# Patient Record
Sex: Male | Born: 1960
Health system: Southern US, Community
[De-identification: ages and names within clinical notes are randomized; demographics above are authoritative.]

## PROBLEM LIST (undated history)

## (undated) DIAGNOSIS — K2270 Barrett's esophagus without dysplasia: Secondary | ICD-10-CM

## (undated) DIAGNOSIS — I8390 Asymptomatic varicose veins of unspecified lower extremity: Secondary | ICD-10-CM

## (undated) DIAGNOSIS — I482 Chronic atrial fibrillation, unspecified: Secondary | ICD-10-CM

## (undated) DIAGNOSIS — F329 Major depressive disorder, single episode, unspecified: Secondary | ICD-10-CM

## (undated) DIAGNOSIS — M1990 Unspecified osteoarthritis, unspecified site: Secondary | ICD-10-CM

## (undated) DIAGNOSIS — F209 Schizophrenia, unspecified: Secondary | ICD-10-CM

## (undated) DIAGNOSIS — M545 Low back pain, unspecified: Secondary | ICD-10-CM

## (undated) DIAGNOSIS — E079 Disorder of thyroid, unspecified: Secondary | ICD-10-CM

## (undated) DIAGNOSIS — R569 Unspecified convulsions: Secondary | ICD-10-CM

## (undated) DIAGNOSIS — E669 Obesity, unspecified: Secondary | ICD-10-CM

## (undated) DIAGNOSIS — I4891 Unspecified atrial fibrillation: Secondary | ICD-10-CM

## (undated) DIAGNOSIS — F419 Anxiety disorder, unspecified: Secondary | ICD-10-CM

## (undated) DIAGNOSIS — F32A Depression, unspecified: Secondary | ICD-10-CM

## (undated) DIAGNOSIS — I499 Cardiac arrhythmia, unspecified: Secondary | ICD-10-CM

## (undated) DIAGNOSIS — S52502A Unspecified fracture of the lower end of left radius, initial encounter for closed fracture: Secondary | ICD-10-CM

## (undated) DIAGNOSIS — E039 Hypothyroidism, unspecified: Secondary | ICD-10-CM

## (undated) HISTORY — PX: ACHILLES TENDON REPAIR: SUR1153

## (undated) HISTORY — PX: CATARACT EXTRACTION: SUR2

## (undated) HISTORY — DX: Asymptomatic varicose veins of unspecified lower extremity: I83.90

## (undated) HISTORY — DX: Low back pain, unspecified: M54.50

## (undated) HISTORY — DX: Obesity, unspecified: E66.9

## (undated) HISTORY — DX: Anxiety disorder, unspecified: F41.9

## (undated) HISTORY — DX: Barrett's esophagus without dysplasia: K22.70

## (undated) HISTORY — PX: TOOTH EXTRACTION: SUR596

---

## 1999-12-12 ENCOUNTER — Emergency Department (HOSPITAL_COMMUNITY): Admission: EM | Admit: 1999-12-12 | Discharge: 1999-12-12 | Payer: Self-pay | Admitting: Emergency Medicine

## 2001-12-12 ENCOUNTER — Emergency Department (HOSPITAL_COMMUNITY): Admission: EM | Admit: 2001-12-12 | Discharge: 2001-12-12 | Payer: Self-pay

## 2001-12-21 ENCOUNTER — Emergency Department (HOSPITAL_COMMUNITY): Admission: EM | Admit: 2001-12-21 | Discharge: 2001-12-21 | Payer: Self-pay | Admitting: Emergency Medicine

## 2001-12-21 ENCOUNTER — Encounter: Payer: Self-pay | Admitting: Emergency Medicine

## 2002-05-02 ENCOUNTER — Emergency Department (HOSPITAL_COMMUNITY): Admission: EM | Admit: 2002-05-02 | Discharge: 2002-05-02 | Payer: Self-pay | Admitting: *Deleted

## 2002-07-06 ENCOUNTER — Emergency Department (HOSPITAL_COMMUNITY): Admission: EM | Admit: 2002-07-06 | Discharge: 2002-07-06 | Payer: Self-pay | Admitting: *Deleted

## 2002-08-16 ENCOUNTER — Encounter: Payer: Self-pay | Admitting: Emergency Medicine

## 2002-08-16 ENCOUNTER — Emergency Department (HOSPITAL_COMMUNITY): Admission: EM | Admit: 2002-08-16 | Discharge: 2002-08-16 | Payer: Self-pay | Admitting: Emergency Medicine

## 2002-08-31 ENCOUNTER — Encounter: Payer: Self-pay | Admitting: Orthopedic Surgery

## 2002-09-03 ENCOUNTER — Inpatient Hospital Stay (HOSPITAL_COMMUNITY): Admission: RE | Admit: 2002-09-03 | Discharge: 2002-09-08 | Payer: Self-pay | Admitting: Orthopedic Surgery

## 2002-09-08 ENCOUNTER — Inpatient Hospital Stay (HOSPITAL_COMMUNITY)
Admission: RE | Admit: 2002-09-08 | Discharge: 2002-09-15 | Payer: Self-pay | Admitting: Physical Medicine & Rehabilitation

## 2002-11-21 ENCOUNTER — Encounter: Admission: RE | Admit: 2002-11-21 | Discharge: 2003-01-05 | Payer: Self-pay | Admitting: Orthopedic Surgery

## 2004-04-29 ENCOUNTER — Ambulatory Visit: Payer: Self-pay | Admitting: Internal Medicine

## 2004-06-11 ENCOUNTER — Ambulatory Visit: Payer: Self-pay | Admitting: Internal Medicine

## 2004-09-24 ENCOUNTER — Ambulatory Visit: Payer: Self-pay | Admitting: Internal Medicine

## 2004-10-01 ENCOUNTER — Ambulatory Visit: Payer: Self-pay | Admitting: Internal Medicine

## 2004-10-08 ENCOUNTER — Ambulatory Visit: Payer: Self-pay | Admitting: Internal Medicine

## 2004-10-22 ENCOUNTER — Ambulatory Visit: Payer: Self-pay | Admitting: Internal Medicine

## 2004-11-08 ENCOUNTER — Emergency Department (HOSPITAL_COMMUNITY): Admission: EM | Admit: 2004-11-08 | Discharge: 2004-11-08 | Payer: Self-pay | Admitting: Emergency Medicine

## 2004-11-15 ENCOUNTER — Emergency Department (HOSPITAL_COMMUNITY): Admission: EM | Admit: 2004-11-15 | Discharge: 2004-11-15 | Payer: Self-pay | Admitting: Emergency Medicine

## 2005-04-16 ENCOUNTER — Ambulatory Visit: Payer: Self-pay | Admitting: Internal Medicine

## 2005-05-07 ENCOUNTER — Ambulatory Visit: Payer: Self-pay | Admitting: Internal Medicine

## 2005-06-17 ENCOUNTER — Inpatient Hospital Stay (HOSPITAL_COMMUNITY): Admission: EM | Admit: 2005-06-17 | Discharge: 2005-06-18 | Payer: Self-pay | Admitting: Emergency Medicine

## 2005-06-17 ENCOUNTER — Encounter (INDEPENDENT_AMBULATORY_CARE_PROVIDER_SITE_OTHER): Payer: Self-pay | Admitting: Interventional Cardiology

## 2005-08-10 ENCOUNTER — Emergency Department (HOSPITAL_COMMUNITY): Admission: EM | Admit: 2005-08-10 | Discharge: 2005-08-10 | Payer: Self-pay | Admitting: Emergency Medicine

## 2005-11-27 ENCOUNTER — Ambulatory Visit: Payer: Self-pay | Admitting: Family Medicine

## 2007-01-31 ENCOUNTER — Inpatient Hospital Stay (HOSPITAL_COMMUNITY): Admission: EM | Admit: 2007-01-31 | Discharge: 2007-02-08 | Payer: Self-pay | Admitting: Emergency Medicine

## 2007-01-31 ENCOUNTER — Ambulatory Visit: Payer: Self-pay | Admitting: Cardiovascular Disease

## 2007-01-31 ENCOUNTER — Ambulatory Visit: Payer: Self-pay | Admitting: Cardiology

## 2007-02-10 DIAGNOSIS — L219 Seborrheic dermatitis, unspecified: Secondary | ICD-10-CM | POA: Insufficient documentation

## 2007-02-10 DIAGNOSIS — E119 Type 2 diabetes mellitus without complications: Secondary | ICD-10-CM | POA: Insufficient documentation

## 2007-02-10 DIAGNOSIS — R7989 Other specified abnormal findings of blood chemistry: Secondary | ICD-10-CM | POA: Insufficient documentation

## 2007-02-10 DIAGNOSIS — I10 Essential (primary) hypertension: Secondary | ICD-10-CM | POA: Insufficient documentation

## 2007-02-10 DIAGNOSIS — E663 Overweight: Secondary | ICD-10-CM | POA: Insufficient documentation

## 2007-02-10 DIAGNOSIS — F329 Major depressive disorder, single episode, unspecified: Secondary | ICD-10-CM | POA: Insufficient documentation

## 2008-02-26 ENCOUNTER — Emergency Department (HOSPITAL_COMMUNITY): Admission: EM | Admit: 2008-02-26 | Discharge: 2008-02-26 | Payer: Self-pay | Admitting: Emergency Medicine

## 2010-01-24 ENCOUNTER — Emergency Department (HOSPITAL_COMMUNITY): Admission: EM | Admit: 2010-01-24 | Discharge: 2010-01-24 | Payer: Self-pay | Admitting: Emergency Medicine

## 2010-07-26 ENCOUNTER — Encounter: Payer: Self-pay | Admitting: Internal Medicine

## 2010-07-27 ENCOUNTER — Encounter: Payer: Self-pay | Admitting: Interventional Cardiology

## 2010-11-18 NOTE — H&P (Signed)
NAME:  [name], [name]               ACCOUNT NO.:  [id number]   MEDICAL RECORD NO.:  [id number]          PATIENT TYPE:  INP   LOCATION:  1413                         FACILITY:  [facility]   PHYSICIAN:  Hind I Elsaid, MD      DATE OF BIRTH:  1960-12-09   DATE OF ADMISSION:  01/31/2007  DATE OF DISCHARGE:                              HISTORY & PHYSICAL   PRIMARY CARE PHYSICIAN:  Unassigned.   CHIEF COMPLAINT:  Altered mental status. Psychiatrist, Dr. Geoffry Paradise.   HISTORY OF PRESENT ILLNESS:  A 50 year old Caucasian male with a history  of obsessive compulsive disorder/bipolar disorder, history of  hypothyroidism, presented today by EMS where the patient was found on  the floor with confusion.  History was taken from both the patient's  aunt, his parents.  According to the father, the patient was complaining  of neck pain for 2 days and right shoulder pain for 1 week.  They live  far from him, and they give him a call on Friday where the patient  complained of pain.  He received his medications.  At that time, he had  poor appetite.  He denies any fever.  On Saturday, the patient also has  the same complaint of neck and shoulder pain, denies any fever.  Today,  the patient has an appointment with his father to see the dentist for  possible tooth extraction.  Father called the son at his apartment with  no answer, and he had to call the police where GBG went to the house and  found the patient lying on the floor.  The patient stated he did not  know how long he was there for.  The patient complained of neck and  right shoulder pain.  At that time, the patient was unable to tell the  EMS the correct year or month, and the patient was unable to give  correct information.  According to the patient, he felt weak today, was  complaining of headache and neck and shoulder pain, stated with  generalized body weakness.  He found himself on the floor.  He cannot  tell any further history.  The  patient denies any headache, continued to  complaint about frontal headache.  Denies any neck stiffness, denies any  seizure.  The patient seemed confused during the conversation.  The  patient denies any abdominal pain, denies any nausea or vomiting and  denies any burning micturition.   PAST MEDICAL HISTORY:  1. Bipolar disorder.  2. Obsessive compulsive disorder.  3. Hypertension.  4. Hypothyroidism.  5. History of seizure disorder, last seizure was more than 15 years      ago.  6. History of gout.  7. History of depression.   ALLERGIES:  No known drug allergies.   OPERATIONS:  History of Achilles tendinitis, ruptured Achilles tendon  and status post correction.   FAMILY HISTORY:  Positive for history of heart failure, hypertension,  diabetes mellitus.   SOCIAL HISTORY:  He lives by himself.  He does not smoke cigarettes or  drink alcohol.  He is not working.  There  is no history of illicit drug  use.   REVIEW OF SYSTEMS:  The patient complained of headache, mainly frontal  headache.  No blurring of vision, no numbness or weakness of his  extremities.  Complained of neck pain and right shoulder pain.  The  patient denies any fever, any chills, or weight loss.  Denies any  genitourinary symptom, denies any abdominal pain, nausea, or vomiting.  Denies any chest pain   VITAL SIGNS:  Blood pressure 130/86, pulse rate 85, respiratory rate 18,  saturating 92% on room air.  Was obese, middle-age white male no discomfort.  He was alert, seemed  confused to time and place.  He knew his mother, and he knows the date  when he was born.  HEENT:  Normocephalic, atraumatic.  Pupils equal, reactive to light and  accommodation.  Mucosa seems dry.  Right lower molar tooth seemed mildly  inflamed.  No evidence of abscess or lymphadenopathy.  NECK:  Double chin.  No evidence of thyromegaly and no lymphadenopathy.  LUNGS:  Normal with equal breathing, equal air entry.  ABDOMEN:   Distended, obese, no organomegaly.  Bowel sounds positive,  nontender, no rebound, guarding, or rigidity.  EXTREMITIES:  Without edema or deformities and no edema.  NEUROLOGIC:  The patient alert, oriented to person, and he can recognize  where he is right now.  He can recognize the date and the year.  There  is no evidence of neck stiffness.  Brudzinski sign negative.  He can  move all his extremities except there is some right shoulder limitation  of movement.  He can abduct his right shoulder up to 60 degrees.  No  warmth and no evidence of swelling compared between the 2 shoulders.  SKIN:  There is no evidence of rashes, no cyanosis.   The patient has obvious streamers of both arms which according to the  father was old in nature.   BLOOD WORKUP:  TSH 0.507, valproic acid 63.6, CMB 4138, potassium 3.7,  chloride 101, potassium 25, BUN 15, creatinine 1.01.  CBC 18.1 with left  shift, hemoglobin 14.5, hematocrit 41.3, platelets 275.  LFTs within  normal.  Urinalysis was negative.  Drug screen was negative.  Chest x-  ray:  Chronic lung changes, no acute process.  CT head negative for no  acute intracranial morphology, and x-ray of the right shoulder  irregularity of the inferior glenoid without acute fracture.   ASSESSMENT:  1. Altered mental status.  2. Elevated white blood cells.  No evidence of fever.  3. Bipolar disorder.  4. Obsessive compulsive disorder on many anti-psychotic medications.  5. Hypertension.  6. Hypothyroidism.   PLAN:  1. Assess altered mental status.  We mentioned the patient confused to      time and place, also with __________ infections, meningitis,      encephalitis, also with dehydration, possibility of rhabdomyolysis,      unlikely seizure, unlikely postictal.  We will admit the patient to      telemetry.  We will start the patient on vancomycin and Rocephin.      We will get MRI of the brain.  We will consider LB to rule out      meningitis versus  encephalitis.  If no response to the above      treatment, we will continue with the IV fluids, aggressive      hydration.  We will get Tylenol level and aspirin level.  CPK  level.  We will monitor the patient inpatient.  If no response, we      will consider LB to rule out meningitis.  We will get VDRL level,      folate level, vitamin B12 level, ammonia level.  2. Hypothyroidism.  We will continue his Synthroid.  3. Psychosis.  History of bipolar disorder, history of obsessive      compulsive disorder.  We will continue his medications.   MEDICATIONS:  1. Synthroid 137 mcg p.o. daily.  2. Nardil 15 mg 6 tabs p.o. daily.  3. __________ 8 mg p.o. t.i.d.  4. Benzatropine 2 mg p.o. daily.  5. Depakote AR 500 mg p.o. b.i.d.  6. Atenolol 50 mg p.o. daily.   Would consider psych consult inpatient if it is deemed necessary.  Hypertension was continued with Atenolol.   Cause of altered mental status is unclear.  We observe inpatient for  further evaluation of his cause of altered mental status.  DVT and GI  prophylaxis.      Hind Bosie Helper, MD  Electronically Signed     HIE/MEDQ  D:  01/31/2007  T:  02/01/2007  Job:  [id number]

## 2010-11-18 NOTE — Consult Note (Signed)
[name], Trujillo               ACCOUNT NO.:  [id number]   MEDICAL RECORD NO.:  [id number]          PATIENT TYPE:  INP   LOCATION:  1413                         [facility]ACILITY:  [facility]   PHYSICIAN:  Antonietta Breach, M.D.  DATE OF BIRTH:  Nov 04, 1960   DATE OF CONSULTATION:  02/01/2007  DATE OF DISCHARGE:                                 CONSULTATION   REQUESTING PHYSICIAN:  [facility].   REASON FOR CONSULTATION:  Delirium, presenting with multiple  psychotropic medications.   [name] is a 50 year old male admitted to [facility] on  January 31, 2007 due to altered mental status.   The patient does have a history of bipolar disorder and obsessive  compulsive disorder. He also has a history of hypothyroidism. On the day  of admission, the patient's father was trying to get a hold of him, and  he could not get an answer. He called EMS. When the police arrived, the  patient was disoriented, confused and found on the floor.   The patient's confusion has improved at the time of the undersigned's  visit. He does have intact orientation. However, he still does have  difficulty providing details of this history. He has no thoughts of  harming himself or others. He is socially appropriate. He has no  hallucinations or delusion.   PAST PSYCHIATRIC HISTORY:  The patient has a history of obsessive  compulsive disorder and bipolar disorder. He has been tried on multiple  psychotropics including Prozac, Zoloft, Paxil. He eventually was placed  on Nardil for antidepression and antianxiety. He has been on Depakote as  his mood stabilizer. He has also been maintained on Trilafon 8 mg t.i.d.  as well as Cogentin 2 mg daily for anti-EPS.   FAMILY PSYCHIATRIC HISTORY:  None known.   SOCIAL HISTORY:  Single. Lives alone. Religion - [employer]. Does not use  alcohol or illegal drugs. Occupation - medically retired.   PAST MEDICAL HISTORY:  1. Hypertension.  2. Hypothyroidism.  3. History  of seizure disorder.  4. History of gout.   MEDICATIONS:  MAR is reviewed. The patient has no known drug allergies.  He is now continued on:  1. Cogentin 2 mg daily.  2. Depakote 500 mg b.i.d.  3. Levothyroxine 137 mcg daily.  4. Trilafon 8 mg t.i.d.  5. Phenelzine 45 mg b.i.d.   REVIEW OF SYSTEMS:  Noncontributory.   EXAMINATION:  VITAL SIGNS:  Temperature 98.4, pulse 66, respirations 18,  blood pressure 105/70, O2 saturation on room air 96%.  MENTAL STATUS EXAMINATION:  [name] is alert. He is socially  appropriate and cooperative. His affect is mildly flat. His mood is  slightly anxious. He is oriented to all spheres. His memory is grossly  intact except for difficulty with some of his history. His speech  involves normal rate and prosody without dysarthria. Thought process -  logical, coherent, goal directed. No looseness of association. Thought  content - he has no hallucinations or delusions. He has no thoughts of  harming himself or others. His insight is good. His judgment is intact  for the  need of treatment.   ASSESSMENT:  AXIS I:  293.00 - delirium, not otherwise specified. This  may have been due to a combination of his MAOI and a drug that could  have caused serotonin syndrome. His vital signs seemed to have been too  stable for this to have been a tyramine problem, and there is no  evidence of neuroleptic malignant syndrome.  296.80 - bipolar disorder, not otherwise specified.  293.84 - anxiety disorder, not otherwise specified, with a likely  history of obsessive compulsive disorder.  AXIS II:  Deferred.  AXIS III:  See general medical problems.  AXIS IV:  General medical.  AXIS V:  50.   Mr. Avans capacity appears to be returning. It is anticipated that  he will be able to return to outpatient living.   The medications were discussed with Mr. Sipp including the risk of  lethal side effects when Nardil is combined with contraindicated  substances.  He understands and does want to proceed with his previous  outpatient regimen including the Nardil.   RECOMMENDATIONS:  Would proceed as your are with the patient's  outpatient psychotropic regimen.   Regarding the patient's psychiatric followup, he still has some  difficulty with history, and it is unclear if he has a regular  outpatient psychiatrist. Would ask the social worker to set this patient  up with an outpatient psychiatrist of his choice within the first week  of discharge. Other options include the clinics attached to [facility], [facility], [facility] or the patient's county mental  health center.      Antonietta Breach, M.D.  Electronically Signed     JW/MEDQ  D:  02/02/2007  T:  02/02/2007  Job:  [id number]

## 2010-11-18 NOTE — Discharge Summary (Signed)
[name], [name]               ACCOUNT NO.:  [id number]   MEDICAL RECORD NO.:  [id number]          PATIENT TYPE:  INP   LOCATION:  1413                         FACILITY:  [facility]   PHYSICIAN:  Wilson Singer, M.D.DATE OF BIRTH:  Dec 26, 1960   DATE OF ADMISSION:  01/31/2007  DATE OF DISCHARGE:  02/08/2007                               DISCHARGE SUMMARY   FINAL DISCHARGE DIAGNOSES:  1. Altered mental state secondary to withdrawal of antipsychotic      medications.  2. Paroxysmal atrial fibrillation.  3. Bipolar disorder.  4. Obsessive-compulsive disorder.  5. Hypertension.  6. Hypothyroidism.  7. Gout.  8. Depression.   MEDICATIONS:  1. Levothyroxine 137 mcg daily.  2. Depakote ER 500 mg one tablet b.i.d.  3. Perphenazine 8 mg three tablets q.h.s.  4. Atenolol 50 mg daily.  5. Benztropine 2 mg, half-tablet b.i.d.  6. Nardil 15 mg three tablets b.i.d.  7. Protonix 40 mg daily.  8. Colchicine 0.6 mg b.i.d.  9. Diltiazem ER 120 mg daily.  10.Prednisone tapering dose, to maintain on prednisone 5 mg a day.   CONDITION ON DISCHARGE:  Stable.   HISTORY:  This 51 year old man had stopped taking some of his  medications such as Nardil and Cogentin, and he was found in his house  lying on the floor in a confused state.  Please see initial history and  examination done by Dr. Eda Paschal.   HOSPITAL PROGRESS:  The patient was admitted and underwent workup for  acute confusional state and altered mental status.  He had MRI of his  brain, which showed no major changes and no acute abnormality.  He also  had an x-ray of his right shoulder because of falling onto the right  shoulder, but there was no fracture there.  While in the hospital he  developed atrial fibrillation and he was seen by cardiology.  They did  an echocardiogram, which showed that the images were severely limited  but there did not appear to be any severe abnormalities.  The  cardiologist felt he was not a good  candidate for anticoagulation and  decided not to do that, and he was controlled with his rate with  addition of Cardizem.  He did well with this and actually converted back  to sinus rhythm of his own accord and remained in sinus rhythm.  During  his stay he did complain of left knee pain and swelling.  His uric acid  level was raised and his sedimentation rate was also raised above 100.  In light of this, he was started with colchicine and steroids and he  seemed to do well with this.  He was also seen by psychiatry while in  the hospital and they decided to restart his medication, which actually  significantly helped his confusional state and brought him back to a  normal state for him.  He still continued to complain of bilateral  wrist pain and other joint pain, so rheumatology was consulted.  Dr.  Kellie Simmering from rheumatology saw the patient and he felt that he basically  had gout and increased his  steroid, and he will undergo a steroid taper.  He will see the patient in his office in about 5 weeks' time.  He has  been seen by occupational and physical therapy and they feel that he is  safe to go home but he will clearly need home health physical and  occupational therapy.   On physical examination today, he is doing well, and he is alert,  oriented, and is back to his usual self.  On physical examination, temperature 97.7, blood pressure 124/88, pulse  55, saturation 93% on room air.  CARDIOVASCULAR:  Heart sounds are present and normal and appear to be in  sinus rhythm.  Lung fields were clear.   His rheumatoid factor is less than 20.   FURTHER DISPOSITION:  I am sending him home on steroid, a prednisone  tapering dose as recommended by Dr. Kellie Simmering.  He must see Dr. Kellie Simmering in  about 5 weeks, and I will suggest he also see his primary care physician  in the next 1-2 weeks for follow-up.      Wilson Singer, M.D.  Electronically Signed     NCG/MEDQ  D:  02/08/2007  T:   02/08/2007  Job:  [id number]   cc:   Dr. Geoffry Paradise, Psychiatrist   Antonietta Breach, M.D.   Veverly Fells. Excell Seltzer, MD  [address]  [place], [place] [address]

## 2010-11-18 NOTE — Discharge Summary (Signed)
[name], [name]               ACCOUNT NO.:  [id number]   MEDICAL RECORD NO.:  [id number]          PATIENT TYPE:  INP   LOCATION:  1413                         FACILITY:  [facility]   PHYSICIAN:  Wilson Singer, M.D.DATE OF BIRTH:  [birth year]   DATE OF ADMISSION:  01/31/2007  DATE OF DISCHARGE:                               DISCHARGE SUMMARY   Audio too short to transcribe (less than 5 seconds)      Wilson Singer, M.D.     NCG/MEDQ  D:  02/08/2007  T:  02/08/2007  Job:  [id number]

## 2010-11-18 NOTE — Consult Note (Signed)
NAMEBRADAN, [name]               ACCOUNT NO.:  [id number]   MEDICAL RECORD NO.:  [id number]          PATIENT TYPE:  INP   LOCATION:  1413                         [facility]ACILITY:  [facility]   PHYSICIAN:  Veverly Fells. Excell Seltzer, MD  DATE OF BIRTH:  May 25, 1961   DATE OF CONSULTATION:  02/01/2007  DATE OF DISCHARGE:                                 CONSULTATION   REQUESTING PHYSICIAN:  [facility].   PRIMARY CARE PHYSICIAN:  Dr. Orinda Kenner.   CARDIOLOGIST:  Will be new, Dr. Tonny Bollman.   REASON FOR CONSULTATION:  New onset atrial fibrillation.   HISTORY OF PRESENT ILLNESS:  A 50 year old, obese, Caucasian male  brought by EMS after falling at home. The patient is a little confused  in his thought processes and family members have arrived during our  interview. Apparently the patient has not been taking his medications at  home as directed and had not been taking some of his thyroid medications  either according to his father. The patient states that he got up out of  his chair and fell. He did not actually pass out. He had called his  father several times prior to this admission a couple days before and  was very confused and they did not hear from him on the day of admission  and therefore they called the police and had them check on him. The  father says the police found him crawling on the floor unable to stand  up. They did call EMS and he was brought to the ER at [facility]. He  was found to be confused. He was complaining of right shoulder pain. The  ER physician evaluated him for meningitis with physical exam and blood  work. He did not have a spinal tap. The patient is still mildly confused  this a.m. The physician who talked with him today noticed that his heart  rate was irregular, an EKG was completed and it showed atrial flutter  with a ventricular rate of 127 beats per minute. He was subsequently  placed on a Cardizem drip at 10 mg an hour and ordered a heparin drip.  On  our evaluation, the patient had converted to normal sinus rhythm. The  patient did not complain of any shortness of breath, chest pain,  dizziness or diaphoresis although he is unable to remember very much  about his history. The patient apparently had had a history according to  his father of an irregular heart rate, atrial fibrillation in the past  secondary to thyroid abnormalities but is not followed by a  cardiologist. The patient also apparently as had a history of  hypertension and his blood pressure medication has not always been taken  as directed.   REVIEW OF SYSTEMS:  Negative for chest pain, shortness of breath,  dizziness or diaphoresis. Positive for altered mental status and right  shoulder pain.   PAST MEDICAL HISTORY:  Bipolar disorder, obsessive-compulsive disorder,  hypertension, gout and hypothyroidism.   SOCIAL HISTORY:  The patient live in [place] alone, he is not  employed. He is not married, he has no  children. He does not smoke, he  does not drink, he does not use illicit drugs. He states both family  members, mother and father are alive and in good health.   CURRENT MEDICATIONS:  1. Cogentin (on hold).  2. Depakote 500 mg b.i.d.  3. Benztropine 2 mg once a day.  4. Atenolol 50 mg daily.  5. Rocephin IV 1 gram q.12 h.  6. Trilafon 8 mg t.i.d.  7. Synthroid 137 mcg once a day.  8. Nardil which is on hold.   ALLERGIES:  No known drug allergies.   CURRENT LABS:  Troponins are negative x3 at 0.04, 0.01 and 0.02. CK 204,  MB 1.3. Hemoglobin 13.9, hematocrit 40.3, white blood cells 14.3,  platelets 265. Sodium 139, potassium 3.6, chloride 100, CO2 30, BUN 13,  creatinine 0.96. Glucose 156.   EKG revealing atrial flutter with rate of 127 beats per minute.  Subsequent EKG reveals normal sinus rhythm with a heart rate of 84 beats  per minute. CT scan of the head no acute intracranial abnormalities.  Chest x-ray revealing no active disease. Cervical spine is  normal. Right  shoulder x-ray no acute findings.   PHYSICAL EXAMINATION:  Blood pressure 125/79, pulse 72, respirations 28,  temperature 98.4, O2 sat 95% on room air.  HEENT:  Head is normocephalic, atraumatic. Eyes PERRLA. Mucous membranes  soft, pink and moist.  NECK:  Obese, supple and without carotid bruits or JVD noted.  CARDIOVASCULAR:  Distant heart sounds, regular rhythm and rate without  murmurs, rubs or gallops. Pulses are 2+ and equal bilaterally.  LUNGS:  Bilateral mild crackles noted.  ABDOMEN:  Very obese, nontender, with normal bowel sounds.  EXTREMITIES:  Without clubbing, cyanosis or edema.  NEUROLOGIC:  The patient had some tremors noted in his hands  bilaterally. He is also somewhat lethargic and has poor memory with some  confusion remaining.   IMPRESSION:  1. Paroxysmal atrial fib flutter converted to normal sinus rhythm on      Cardizem drip at 10 mg an hour. He had not been started on heparin      at the time of this evaluation.  2. History of hypertension.  3. Bipolar disorder with obsessive-compulsive disorder.  4. Hypothyroidism.  5. Altered mental status.  6. History of questionable medical noncompliance secondary to prior      number.   The patient was seen and examined by myself and Dr. Tonny Bollman in  the room. The patient's parents had just arrived as Dr. Excell Seltzer had  arrived as well. The patient's father states that he had had confusion 2-  3 days prior to admission and had called him not himself. The patient's  family called the police when they did not hear from him. On the day of  admission he was found on the floor crawling after he apparently had  fallen. The ER doctor is evaluating him for meningitis; however, no  spinal tap has been completed. We are asked to evaluate secondary to new  onset atrial fibrillation which has now converted to normal sinus  rhythm. EKG shows more atrial flutter than fibrillation. We agree with  Cardizem drip  but will discontinue it and put him on p.o. Cardizem 180  mg daily. We will also discontinue heparin and place him on DVT  prophylaxis with Lovenox per pharmacy's recommendation of  weight and dosage. An echocardiogram will be completed. His TSH was  found to be normal at 0.507. After echo is completed, we will  make  further recommendations based upon hospital course and patient's  response to medical treatment.      Bettey Mare. Lyman Bishop, NP      Veverly Fells. Excell Seltzer, MD  Electronically Signed    KML/MEDQ  D:  02/01/2007  T:  02/02/2007  Job:  [id number]   cc:   Orinda Kenner, MD

## 2010-11-21 NOTE — Discharge Summary (Signed)
NAME:  BRANSON, KRANZ                         ACCOUNT NO.:  [id number]   MEDICAL RECORD NO.:  [id number]                   PATIENT TYPE:  INP   LOCATION:  5733                                 FACILITY:  [facility]   PHYSICIAN:  Deidre Ala, M.D.                 DATE OF BIRTH:  [birth year]   DATE OF ADMISSION:  09/02/2002  DATE OF DISCHARGE:  09/08/2002                                 DISCHARGE SUMMARY   ADMISSION DIAGNOSIS:  Right Achilles tendon rupture.   DISCHARGE DIAGNOSIS:  Right Achilles tendon rupture, status post repair of  ruptured Achilles tendon.   SUMMARY:  The patient was admitted to [facility] and taken to the  operating room on 09/02/02 and had right Achilles tendon rupture repaired  primarily and covered with graft jacket after assessment of an avulsion  fracture off the calcaneus.  This was done under general endotracheal  anesthesia with a tourniquet time of 1 hour and 11 minutes, minimal blood  loss, and no drains.  He was taken to the recovery room in stable condition.  On 09/02/02, later that day, he had some pain down the back of his foot and  calf.  No numbness, no tingling, no burning or radiating pain.  His vital  signs were stable, and he was afebrile.  He was awake, alert, and  appropriate.  The cast was intact.  He was neurovascularly intact distally.  We are going to have him use an ice bag on the top of the cast, elevating it  up on two pillows.  If he had any numbness, tingling, or burning, he was to  increase elevation on a third pillow.  If symptoms did not respond within 15  minutes, he was instructed to _________ and split the cast.  He was ordered  Robaxin 750 mg one q.6-8h. for spasm and cramping, and we were planning to  discharge him in the morning if he had good pain control.   On 09/03/02, in the morning, he had a moderate amount of pain, his vitals  were stable.  He was neurovascularly intact over the right foot.  He was not  significantly swollen.  The plan was made to admit him, keep him in the  hospital for a few days, keep it elevated, mostly because of the fact that  he had a small apartment on the second floor and would have difficulty  getting up and down stairs.  He was having difficulty ambulating with  crutches, so PT, OT, rehab, and discharge planning were consulted.   On 09/04/02, postoperative day #2, he was having minimal to moderate pain.  It  was well controlled.  He had not been out of bed since the surgery, and he  reported he had not had any bowel movements.  His vitals were stable.  Maximum temperature was 102 in the night, down to 98.6 in the morning.  His  toes were minimally swollen, but he was neurovascularly intact in the right  foot.  We are going to continue to keep him elevated on 2-3 pillows, have  crutches to the bedside, PT to assist him, getting him transfers out of bed,  ambulating to the bathroom.   On 09/05/02, postoperative day #3, he was having left knee pain and stiffness,  right elbow pain and stiffness, and mild right leg pain, occasional  throbbing.  He had been using minimal amounts of PCA.  PT was at the time  recommending a short inpatient rehab stay.  His vitals were stable, he was  afebrile.  The cast was intact, clean and dry.  He was neurovascularly  intact distally.  His PCA was discontinued.  The IV was decreased to a  saline well, and he was given Percocet 1-2 p.o. q.4-6h. for pain, Dilaudid  IV 2-4 mg q.3-4h. for severe pain.  PT was to encourage ambulation, get him  out of bed to chair, and he was instructed to be transferred to rehab when a  bed became available.   On 09/06/02, postoperative day #4, he was also complaining of right wrist  pain, in addition to his previous left knee and calf pain, and mild right  calf and foot pain.  The foot pain was well controlled with his p.o. meds.  He was taking p.o. intake well without any nausea.  His vital signs were   stable.  He had a maximum temperature of 100.5 in the night, down to 99.2 in  the morning.  The cast was intact.  He was distally intact neurovascularly.  The right wrist was diffusely edematous, 1-2+ pitting edema, warm, but no  erythema or ecchymosis.  Diffusely tender.  The left knee had no erythema,  no ecchymosis.  Had a trace effusion.  He was nontender over the medial or  lateral joint lines.  He was mildly tender in the popliteal fossa.  The calf  was tender.  He had a minimally positive Homans sign.  A lot of palpable  lumps in the calf and popliteal fossa.  The plan was made to get a Doppler  ultrasound of the lower extremities to rule out DVT, SVT, or Baker's cyst,  will have him elevate the right hand on two pillows, an infiltrated IV, will  have him continue to elevate the right leg on two pillows, and he was  nonweightbearing on the right foot.  They were instructed to page with the  results from the Dopplers.  The results came back negative.   On 09/07/02, postoperative day #5, he was still complaining of a right wrist,  left knee, and calf pain, in addition to the calf and foot pain.  It was  better controlled with OxyContin.  He was also complaining of neck pain in  the morning.  He said he had a headache the night before.  The Dopplers were  negative for DVT, SVT, or Baker's.   By the sixth day he was afebrile, the cast was intact, he was  neurovascularly intact distal.  The right wrist was diffusely swollen and  tender, but no real point tenderness over the radial carpal, the snuff box,  TFCC, or the triscaphoid joint.  The left knee and calf were less tender in  the morning, and the Homans was still positive, but much less so that the  day before.  O2 was ordered in because PT had been recommending rehab, but  rehab  would not take him without more than one modality.  He is on Coumadin  and Lovenox per protocol, and his discharge plans were pending the results of the OT  and rehab consults.  OT came on board, and they were recommending  a short rehab stay, as well.   On 09/08/02, postoperative day #6, he was complaining of right elbow, right  wrist, left knee, calf, and Achilles pain.  Had discussed the medications  with his primary M.D., and they did approve Lovenox after he had initially  refused it.  The right wrist was less swollen with no ecchymosis.  His  vitals were stable with a maximum temperature of 101, and down to 98.4 in  the morning.  The right elbow had no edema, no ecchymosis, no erythema.  He  was mildly tender over the triceps tendon, and nontender over the olecranon  or either epicondyle.  The left knee had no erythema, ecchymosis, or  effusion.  No Baker's cyst.  The left Achilles was intact.  No gaps  palpable.  No palpable olives.  He was able to actively plantar flex his  foot.  He was able to extend his right arm with no real pain on resistance,  though it was weak, and he had a poor effort.  The right lower extremity was  neurovascularly intact, and the cast was intact.  He had many pain  complaints, some while attempting to walk with the walker, poor amount of  effort, and he had not done well with therapy.  The plan was made to  transfer him to rehab when bed was available per OT and rehab.  Continue  Lovenox and Coumadin per protocol.  Later that day, a rehab bed became  available, so he was discharged to rehab in stable condition.     Madilyn Fireman, P.A.-C.                       Deidre Ala, M.D.    AC/MEDQ  D:  10/12/2002  T:  10/14/2002  Job:  [id number]

## 2010-11-21 NOTE — Op Note (Signed)
NAME:  [name], [name]                         ACCOUNT NO.:  [id number]   MEDICAL RECORD NO.:  [id number]                   PATIENT TYPE:  OIB   LOCATION:  2899                                 FACILITY:  [facility]   PHYSICIAN:  [name], M.D.                 DATE OF BIRTH:  Apr 10, 1961   DATE OF PROCEDURE:  08/31/2002  DATE OF DISCHARGE:                                 OPERATIVE REPORT   PREOPERATIVE DIAGNOSES:  Subacute rupture, right Achilles tendon; with  avulsion of bony fragment off os-calsus.   POSTOPERATIVE DIAGNOSES:  Subacute rupture, right Achilles tendon; with  avulsion of bony fragment off os-calsus.   PROCEDURE:  Open fixation of right Achilles tendon rupture, with use of  graft jacket, artifical skin graft circumferentially; and, excision of bony  fragment from os-calsus.   SURGEON:  [name], M.D.   ASSISTANT:  [name], P.A.-C.   ANESTHESIA:  General endotracheal.   CULTURES:  None.   DRAINS:  None.   ESTIMATED BLOOD LOSS:  Minimal.   TOURNIQUET TIME:  1 hr 11 min.   PATHOLOGIC FINDINGS AND HISTORY:  The patient is a 50 year old white male,  who two weeks ago stepped hard on a step and felt a pop in his heel.  X-rays  revealed a pulled up avulsion fragment with a palpable gap in the tendon  insertion of the os-calsus, with a positive Maisie Fus' test.  The patient is a  schizo-affective personality, with chronic personality and obsessive-  compulsive disorder.  He is brought in at [facility] to have  the surgery the next day; however, the patient has been on chronic Nardel  (which is an MAO inhibitor).  Anesthesia felt he needed to be off of this  drug for at least two weeks to be done outpatient, at least one week to be  done in-patient -- due to possible complications of the medicine with  anesthetics and the perioperative period.  The patient also is on Depakote  and Cogentin, among other things.  The patient has been tried by  his  psychiatrist in [place] on different medications, and the only thing  worked is the rather old style drug Nardel.  I called the psychiatrist who  said there was no substitute for it, but the patient could come off of it  safely and go back on it.  He said the problem would be that the patient  would be very difficult with respect to coming off the medicine, and the  patient did not want to come off of it.  Eventually he stopped the  medication one week ago Tuesday, which is now 12-13 days ago.  We therefore  brought him into [facility] for the surgery.  Of course,  the Achilles' tendon has somewhat healed down during this period of time.   At surgery today, the bony fragment that had  pulled off the os-calsus could  not be pulled down through its bed.  It was therefore excised and we simply  had to remove the tissue that was stretched and do a fresh suture line back  down just about 1-2 cm above the heel.  A bone-to-tendon and  tendon-to-tendon with #1 Vicryl was done.  We did not do a gastroc fascial  turndown flap, instead we used a graft jacket to surround this area;  hopefully to improve the cellularity and beef-up the repair.  We put him in  maximal plantar flexion to achieve the close down of the stretched area, and  after the tendon that was nonviable was excised.  The tendon was not good;  it had calcium within it in a spotty fashion, and appeared chronically  tendonotic.  The patient had reported tendinosis in the past.  Furthermore,  his tissues did not hold stitches well, even in the subcutaneous tissue; I  think this is a direct result of the Depakote.  In any case, we did get it  repaired and covered. We used lazy S incisions to get the skin flaps thick  and to not be directly over the repair without undue tension.  Of course,  there is always concern over the repair site and we will watch this closely.   We also came across a very small fasicle of  the seral nerve.  We repaired it  with a 6-0 Dermalon suture.  This only represented about 20% of the seral  nerve.  It was done to prevent any kind of neuromen continuity.   DESCRIPTION OF PROCEDURE:  With adequate anesthesia obtained using  endotracheal technique, the patient was placed prone on chest rolls.  The  right lower extremity was prepped from the toes to the knee in the standard  fashion.  After sterile prepping and draping, Esmarch exsanguination was  used.  A tourniquet was let up to 350 mmHg.  The skin incision was then  started on the medial side of the heel, down on the heel, and then up about  4-5 inches; then crisscrossed over to the lateral side and then back toward  the midline.  The incision was deepened sharply with a knife.  Hemostasis  was obtained using Bovie electrocoagulater.  Flaps were developed very  thickly.  We then identified the avulsion fragment; felt it could not be  pulled down, even in maximum plantar flexion of the foot, and excised the  bony fragment.  I then cleaned up the obviously tendonotic tissue that had  been stretched, and then was left with the tissues to get back together with  repair; with a series of interrupted mattress sutures, with knots buried of  #1 PDS.  I then sewed on each side of the tendon with #1 PDS running,  locking to define it better into a cord.  I then placed the graft jacket  with the shiny side down around the tendon, trimming it slightly.  I placed  one stitch to make it circumferentially in the middle (where the repair  was), and then I sutured it down with a running 0 Vicryl suture.  Irrigation  was carried out.  The graft jacket had been thoroughly hydrated.   The wound was then closed in layers with #1 Vicryl on the subcutaneous  tissue, 2-0 Vicryl running and skin staples.  This was in maximum plantar  flexion.  A bulky, sterile compressive dressing was applied with a gravity- type, fully acquired and  a  short-leg fiberglass cast.  The patient was then  awakened and the tourniquet let down.  He was taken to recovery room in  satisfactory condition, for routine postoperative care and overnight stay.                                               [name], M.D.    VEP/MEDQ  D:  09/02/2002  T:  09/02/2002  Job:  [id number]

## 2010-11-21 NOTE — Discharge Summary (Signed)
NAME:  [name], [name]                         ACCOUNT NO.:  [id number]   MEDICAL RECORD NO.:  [id number]                   PATIENT TYPE:  IPS   LOCATION:  4011                                 FACILITY:  [facility]   PHYSICIAN:  Ellwood Dense, M.D.                DATE OF BIRTH:  17-Jun-1961   DATE OF ADMISSION:  09/08/2002  DATE OF DISCHARGE:  09/15/2002                                 DISCHARGE SUMMARY   DISCHARGE DIAGNOSES:  1. Right Achilles tendon repair and excision of calcaneal avulsion fracture     fragment.  2. Depression.  3. Gastrointestinal bleed.  4. Constipation.   HISTORY OF PRESENT ILLNESS:  The patient is a 50 year old man with history  of depression and right lower extremity injury with right Achilles tendon  rupture and calcaneal avulsion fracture, who elected to undergo repair of  tendon with excision of calcaneal fragment on 09/02/2002,  by Dr. Renae Fickle. He  was status nonweightbearing on right lower extremity with short leg cast in  place. The patient with complaints of multiple pain issues with left knee,  right elbow, right wrist, and left calf pain.   A left lower extremity duplex was done on 09/06/2002, and was negative for  DVT.  The patient was started on Lovenox and Coumadin yesterday for DVT  prophylaxis.  Physical therapy was ongoing and the patient is total less  than 60 to transfers, less than a total of 50% to ambulate 5 feet with a  rolling walker, requiring cues to maintain non weightbearing status.   PAST MEDICAL HISTORY:  1. Depression.  2. Dyspnea on exertion.  3. GERD.  4. Rosacea.  5. Insomnia.  6. Hypothyroidism.  7. DJD.  8. History of recent right foot ligament treatment with cast.   ALLERGIES:  No known drug allergies.   SOCIAL HISTORY:  The patient lives alone in an apartment, 13 steps at entry.  He was independent  prior to  admission. He is currently disabled secondary  to mental illness. He works part time at [employer]. He  does not use  any tobacco or alcohol.   HOSPITAL COURSE:  The patient was admitted to rehabilitation on September 08, 2002, for inpatient therapies to consist of physical therapy and  occupational therapy daily. Past admission he was  maintained on non  weightbearing status of his right lower extremity. The patient was  cross  covered with Lovenox until Coumadin on a therapeutic basis. At the time of  discharge the patient's PT and INR are therapeutic at 5.9 and 2.9.   The patient is discharged on 2.5 mg Coumadin a day. The Coumadin is to  continue through 10/01/2002, to complete his DVT prophylaxis course. A home  health R.N. has been arranged to draw next pro time on 09/18/2002, with  [facility] to follow and adjust Coumadin as needed.   The patient's mood has been  stable during his stay on current psychiatric  medications. Labs done past admission showed hemoglobin 13.4, hematocrit  38.6, white count 9.3, platelets 408. Sodium 139, potassium 3.9, chloride  98, CO2 32, BUN 10, creatinine 0.9, glucose 109. Liver function tests within  normal limits.   Pain control was initiated initially and the patient was started on  OxyContin CI 20 q.12h. in addition to Celebrex and scheduled for Skelaxin  for muscle spasms with p.r.n. Oxycodone used. At the time of discharge the  patient has been tapered off OxyContin and recommended to continue tapering  medications with tapering Skelaxin to p.r.n. use.   The patient's short-leg cast remains in place, with the right lower  extremity being neurovascularly intact. The patient's anxiety level and  multiple pain issues have improved greatly by the time of discharge.   During his stay in rehabilitation the patient has made good progress.  Currently he is modified independent for ADLs. He requires supervision with  set assist with lower body dressing. He is supervision for toileting and  hygiene. He does require supervision secondary to a tendency  to get shaky  when standing and is having  problems balancing with 1 hand on his walker.  Currently he is modified independent for transfers and modified independent  to supervision for ambulating short distances. The patient's father is to  provide 24-hour supervision past discharge. Also further followup home  health physical therapy and occupational therapy to continue by [facility] past discharge. On 09/15/2002, the patient was discharged to home.   DISCHARGE MEDICATIONS:  1. Cogentin 2 mg a day.  2. Depakote ER 500 mg q.h.s.  3. Levothyroxine 137 micrograms per day.  4. Trilafon 8 mg capsules q.h.s.  5. Celebrex 200 mg per day.  6. Skelaxin 400 mg q.i.d.  7. Nardil 45 mg b.i.d.  8. Coumadin 3 mg 1-1/2 q.h.s.  9. Oxycodone IR 5 to 10 mg p.o. q.4-6h. p.r.n. pain.   DISCHARGE INSTRUCTIONS:  Activity, may use walker. No weight right lower  extremity. Keep cast intact. Special instructions, no aspirin or aspirin  products or NSAIDs while on Coumadin. No alcohol, no smoking, no driving.   FOLLOW UP:  The patient is to follow up with Dr. Renae Fickle in 7 to 10 days for  postoperative check. Followup with Dr. Thomasena Edis as needed. Followup with LMD  for routine check.     Greg Cutter, P.A.                    Ellwood Dense, M.D.    PP/MEDQ  D:  09/15/2002  T:  09/17/2002  Job:  [id number]   cc:   Ernesta Amble, M.D.  [address]  [place], [place] [address]  Fax: [phone]

## 2010-11-21 NOTE — H&P (Signed)
NAMEKASAI, BELTRAN NO.:  [id number]   MEDICAL RECORD NO.:  [id number]          PATIENT TYPE:  EMS   LOCATION:  MAJO                         FACILITY:  [facility]   PHYSICIAN:  Ulyses Amor, MD DATE OF BIRTH:  1960-08-30   DATE OF PROCEDURE:  DATE OF DISCHARGE:                      STAT - MUST CHANGE TO CORRECT WORK TYPE   [name] is a 50 year old white man who is admitted to [facility]  for further evaluation of chest pain.  In addition, he is also to  be evaluated for an episode of nonsustained ventricular tachycardia which  was observed in the emergency department.   The patient has no known history of cardiac disease other than an occasional  arrhythmia which has been observed in the past.  The exact details of this  arrhythmia are not known.  He presented to the emergency department with a  three-day history of chest pain.  This was described as a sharp discomfort  in the mid sternum.  It radiates to the right parasternal area.  It has OT  been associated with dyspnea, diaphoresis, or nausea.  The discomfort has  been present more or less continuously though in a low-grade fashion, for  the last three days.  It seems to be exacerbated by turning of his torso.  There are no other exacerbating or ameliorating factors.  It appears not to  be related to activity, meals, or respirations.  He reports having  experienced this discomfort at times in the past, though he cannot relate  the frequency or pattern.  His discomfort seems not to be relieved with  Naprosyn which he has taken over the last three days.   The patient has no past history of cardiac disease, including no history of  myocardial infarction, coronary artery disease, or congestive heart failure.  He has a number of risk factors for coronary artery disease including  hypertension and a family history of coronary artery disease (his father in  his 59's).  There is no history of  diabetes mellitus, dyslipidemia, or  smoking.   The patient reports occasional instances of episodic dizziness and  lightheadedness, though there has been no syncope.  He reports no history of  palpitations.   PAST MEDICAL HISTORY:  The patient has a number of other medical problems  including:  1.  Seizure disorder.  2.  Hypothyroidism.  3.  It is not clear what his past psychiatric history is.   MEDICATIONS:  Nardil, Cogentin, Trilafon, atenolol, Depakote, and  levothyroxine.   ALLERGIES:  None.   OPERATION/PROCEDURE:  None.   SIGNIFICANT INJURIES:  None.   SOCIAL HISTORY:  The patient lives alone but sees his parents frequently.  He does not work.  He does not smoke cigarettes or drink alcohol.  There is  no history of illicit drug usage.   REVIEW OF SYSTEMS:  No problems related to his head, eyes, ears, nose,  mouth, throat, lungs, gastrointestinal system, genitourinary system, or  extremities.  There is no history of neurologic, psychiatric disorder.  There is no history of fever, chills, or weight loss.  PHYSICAL EXAMINATION:  VITAL SIGNS:  Blood pressure 119/76, pulse 67 and  regular, respirations 20, temperature 99.5.  GENERAL:  The patient was an obese, middle-aged white man in no discomfort.  He was alert, oriented, appropriate, and responsive.  HEENT:  Normal.  NECK:  Without thyromegaly or adenopathy.  Carotid pulses were palpable  bilaterally and without bruits.  CARDIAC:  Normal S1 and S2.  No S3, S4, murmur, rub, or click.  Cardiac  rhythm is regular.  CHEST:  No chest wall tenderness was noted.  LUNGS:  Clear.  ABDOMEN:  Soft, nontender.  There was no mass, hepatosplenomegaly, bruit,  distention, rebound, guarding, or rigidity.  Bowel sounds were normal.  EXTREMITIES:  Without edema, deviation, or deformity.  Radial and dorsalis  pedis pulses were palpable bilaterally.  NEUROLOGIC:  Brief screening  neurological survey was unremarkable.   RECTAL/GENITALIA:  Not performed and they were not pertinent to reason for  acute card hospitalization.   LABORATORY DATA:  The electrocardiogram was normal with the exception of  frequent PVCs.  Chest radiograph, according to the radiologist, demonstrated  only cardiomegaly.  The initial set of cardiac markers revealed a myoglobin  of 57.3, CK-MB 1.  Troponin less than 0.05.  A second set of cardiac markers  revealed a myoglobin of 43.1.  CK-MB less than 1, troponin less than 0.05.  calcium 3.8, BUN 20, creatinine pending.  While in the emergency department,  he was noted to have an approximately 15-beat run of ventricular tachycardia  (rate 161).  He was asymptomatic at that time.  The event resolved  spontaneously.  The remaining laboratory studies were pending at the time of  this dictation.   IMPRESSION:  1.  Chest pain, rule out coronary artery disease. Chest pain is substernal      and right parasternal in location.  It is sharp in quality.  It is      exacerbated by turning of the torso.  2.  Nonsustained ventricular tachycardia.  Approximately 15 beats were noted      in the emergency department at a rate of 161 beats per minute.  He was      asymptomatic at that time.  He has a history of occasional dizziness and      lightheadedness but no syncope.  3.  Hypertension.  4.  Seizure disorder.  5.  Hypothyroidism.  6.  Obesity.  7.  Unclear psychiatric history.   PLAN:  1.  Telemetry.  2.  Serial cardiac enzymes.  3.  Aspirin.  4.  Intravenous heparin.  5.  Nitrol paste.  6.  Echocardiogram.  7.  Further measures per Dr. Katrinka Blazing.      Ulyses Amor, MD  Electronically Signed     MSC/MEDQ  D:  06/17/2005  T:  06/17/2005  Job:  [id number]   cc:   Lyn Records, M.D.  Fax: [phone]

## 2011-04-20 LAB — CULTURE, BLOOD (ROUTINE X 2)
Culture: NO GROWTH
Culture: NO GROWTH

## 2011-04-20 LAB — COMPREHENSIVE METABOLIC PANEL
ALT: 12
ALT: 12
ALT: 15
AST: 16
AST: 14
AST: 21
Albumin: 2.3 — ABNORMAL LOW
Albumin: 2.3 — ABNORMAL LOW
Albumin: 3.5
Alkaline Phosphatase: 88
Alkaline Phosphatase: 112
Alkaline Phosphatase: 98
BUN: 16
BUN: 15
BUN: 9
CO2: 32
CO2: 25
CO2: 29
Calcium: 8.5
Calcium: 8.5
Calcium: 9.1
Chloride: 98
Chloride: 101
Chloride: 104
Creatinine, Ser: 0.81
Creatinine, Ser: 0.86
Creatinine, Ser: 1.02
GFR calc Af Amer: >60
GFR calc Af Amer: >60
GFR calc Af Amer: >60
GFR calc non Af Amer: >60
GFR calc non Af Amer: >60
GFR calc non Af Amer: >60
Glucose, Bld: 135 — ABNORMAL HIGH
Glucose, Bld: 124 — ABNORMAL HIGH
Glucose, Bld: 139 — ABNORMAL HIGH
Potassium: 4.2
Potassium: 3.7
Potassium: 3.7
Sodium: 136
Sodium: 138
Sodium: 140
Total Bilirubin: 0.7
Total Bilirubin: 0.6
Total Bilirubin: 1.1
Total Protein: 6
Total Protein: 6
Total Protein: 7.6

## 2011-04-20 LAB — DIFFERENTIAL
Basophils Absolute: 0.1
Basophils Relative: 0
Eosinophils Absolute: 0
Eosinophils Relative: 0
Lymphocytes Relative: 7 — ABNORMAL LOW
Lymphs Abs: 1.2
Monocytes Absolute: 1.7 — ABNORMAL HIGH
Monocytes Relative: 9
Neutro Abs: 15.2 — ABNORMAL HIGH
Neutrophils Relative %: 84 — ABNORMAL HIGH

## 2011-04-20 LAB — URINALYSIS, ROUTINE W REFLEX MICROSCOPIC
Glucose, UA: NEGATIVE
Hgb urine dipstick: NEGATIVE
Ketones, ur: 15 — AB
Nitrite: NEGATIVE
Protein, ur: NEGATIVE
Specific Gravity, Urine: 1.025
Urobilinogen, UA: 0.2
pH: 5.5

## 2011-04-20 LAB — CARDIAC PANEL(CRET KIN+CKTOT+MB+TROPI)
CK, MB: 0.5
CK, MB: 1.3
CK, MB: 1.4
CK, MB: 2
Relative Index: 0.6
Relative Index: 0.9
Relative Index: 1.1
Relative Index: INVALID
Total CK: 123
Total CK: 204
Total CK: 213
Total CK: 74
Troponin I: 0.01
Troponin I: 0.02
Troponin I: 0.02
Troponin I: 0.03

## 2011-04-20 LAB — BASIC METABOLIC PANEL
BUN: 16
BUN: 21
BUN: 13
CO2: 27
CO2: 30
CO2: 30
Calcium: 8.8
Calcium: 9.1
Calcium: 8.7
Chloride: 101
Chloride: 99
Chloride: 100
Creatinine, Ser: 0.78
Creatinine, Ser: 0.81
Creatinine, Ser: 0.96
GFR calc Af Amer: >60
GFR calc Af Amer: >60
GFR calc Af Amer: >60
GFR calc non Af Amer: >60
GFR calc non Af Amer: >60
GFR calc non Af Amer: >60
Glucose, Bld: 185 — ABNORMAL HIGH
Glucose, Bld: 193 — ABNORMAL HIGH
Glucose, Bld: 156 — ABNORMAL HIGH
Potassium: 3.7
Potassium: 3.8
Potassium: 3.6
Sodium: 138
Sodium: 138
Sodium: 139

## 2011-04-20 LAB — CBC
HCT: 35.2 — ABNORMAL LOW
HCT: 37 — ABNORMAL LOW
HCT: 38.9 — ABNORMAL LOW
HCT: 34.7 — ABNORMAL LOW
HCT: 40.3
HCT: 41.3
Hemoglobin: 12 — ABNORMAL LOW
Hemoglobin: 12.6 — ABNORMAL LOW
Hemoglobin: 13.5
Hemoglobin: 12 — ABNORMAL LOW
Hemoglobin: 13.9
Hemoglobin: 14.5
MCHC: 34
MCHC: 34
MCHC: 34.6
MCHC: 34.4
MCHC: 34.6
MCHC: 35.1
MCV: 86.7
MCV: 87.3
MCV: 88.2
MCV: 87
MCV: 87.4
MCV: 87.6
Platelets: 241
Platelets: 321
Platelets: 331
Platelets: 258
Platelets: 265
Platelets: 274
RBC: 4.03 — ABNORMAL LOW
RBC: 4.2 — ABNORMAL LOW
RBC: 4.49
RBC: 3.96 — ABNORMAL LOW
RBC: 4.61
RBC: 4.75
RDW: 14.8 — ABNORMAL HIGH
RDW: 14.9 — ABNORMAL HIGH
RDW: 14.9 — ABNORMAL HIGH
RDW: 14.8 — ABNORMAL HIGH
RDW: 14.9 — ABNORMAL HIGH
RDW: 15.1 — ABNORMAL HIGH
WBC: 14.5 — ABNORMAL HIGH
WBC: 16.2 — ABNORMAL HIGH
WBC: 20.1 — ABNORMAL HIGH
WBC: 14.3 — ABNORMAL HIGH
WBC: 14.5 — ABNORMAL HIGH
WBC: 18.1 — ABNORMAL HIGH

## 2011-04-20 LAB — MAGNESIUM: Magnesium: 1.9

## 2011-04-20 LAB — RAPID URINE DRUG SCREEN, HOSP PERFORMED
Amphetamines: NOT DETECTED
Barbiturates: NOT DETECTED
Benzodiazepines: NOT DETECTED
Cocaine: NOT DETECTED
Opiates: NOT DETECTED
Tetrahydrocannabinol: NOT DETECTED

## 2011-04-20 LAB — LIPID PANEL
Cholesterol: 189
HDL: 23 — ABNORMAL LOW
LDL Cholesterol: 133 — ABNORMAL HIGH
Total CHOL/HDL Ratio: 8.2
Triglycerides: 167 — ABNORMAL HIGH
VLDL: 33

## 2011-04-20 LAB — ANA: Anti Nuclear Antibody(ANA): NEGATIVE

## 2011-04-20 LAB — FOLATE: Folate: 9.4

## 2011-04-20 LAB — CK TOTAL AND CKMB (NOT AT ARMC)
CK, MB: 2.9
Relative Index: 0.9
Total CK: 306 — ABNORMAL HIGH

## 2011-04-20 LAB — HEMOGLOBIN A1C
Hgb A1c MFr Bld: 6.1
Mean Plasma Glucose: 140

## 2011-04-20 LAB — SEDIMENTATION RATE
Sed Rate: 104 — ABNORMAL HIGH
Sed Rate: 40 — ABNORMAL HIGH
Sed Rate: 92 — ABNORMAL HIGH

## 2011-04-20 LAB — TSH
TSH: 0.507
TSH: 3.581

## 2011-04-20 LAB — RPR: RPR Ser Ql: NONREACTIVE

## 2011-04-20 LAB — TROPONIN I: Troponin I: 0.04

## 2011-04-20 LAB — AMMONIA: Ammonia: 21

## 2011-04-20 LAB — ACETAMINOPHEN LEVEL: Acetaminophen (Tylenol), Serum: <10 — ABNORMAL LOW

## 2011-04-20 LAB — CALCIUM: Calcium: 9.2

## 2011-04-20 LAB — URIC ACID: Uric Acid, Serum: 7.8 — ABNORMAL HIGH

## 2011-04-20 LAB — PHOSPHORUS: Phosphorus: 3.5

## 2011-04-20 LAB — SALICYLATE LEVEL: Salicylate Lvl: <4

## 2011-04-20 LAB — VALPROIC ACID LEVEL: Valproic Acid Lvl: 63.6

## 2011-04-20 LAB — RHEUMATOID FACTOR: Rhuematoid fact SerPl-aCnc: <20

## 2011-04-20 LAB — VITAMIN B12: Vitamin B-12: 470 (ref 211–911)

## 2011-05-27 ENCOUNTER — Observation Stay (HOSPITAL_COMMUNITY)
Admission: EM | Admit: 2011-05-27 | Discharge: 2011-05-28 | Disposition: A | Discharge status: Home / Self Care | Payer: Medicare Other | Attending: Internal Medicine | Admitting: Internal Medicine

## 2011-05-27 ENCOUNTER — Emergency Department (HOSPITAL_COMMUNITY): Payer: Medicare Other

## 2011-05-27 ENCOUNTER — Encounter: Payer: Self-pay | Admitting: *Deleted

## 2011-05-27 ENCOUNTER — Other Ambulatory Visit: Payer: Self-pay

## 2011-05-27 DIAGNOSIS — G40909 Epilepsy, unspecified, not intractable, without status epilepticus: Secondary | ICD-10-CM | POA: Insufficient documentation

## 2011-05-27 DIAGNOSIS — M25519 Pain in unspecified shoulder: Secondary | ICD-10-CM | POA: Insufficient documentation

## 2011-05-27 DIAGNOSIS — R079 Chest pain, unspecified: Secondary | ICD-10-CM | POA: Diagnosis present

## 2011-05-27 DIAGNOSIS — I10 Essential (primary) hypertension: Secondary | ICD-10-CM

## 2011-05-27 DIAGNOSIS — F3289 Other specified depressive episodes: Secondary | ICD-10-CM | POA: Diagnosis present

## 2011-05-27 DIAGNOSIS — Z79899 Other long term (current) drug therapy: Secondary | ICD-10-CM | POA: Insufficient documentation

## 2011-05-27 DIAGNOSIS — M1990 Unspecified osteoarthritis, unspecified site: Secondary | ICD-10-CM

## 2011-05-27 DIAGNOSIS — E079 Disorder of thyroid, unspecified: Secondary | ICD-10-CM

## 2011-05-27 DIAGNOSIS — D72829 Elevated white blood cell count, unspecified: Secondary | ICD-10-CM | POA: Diagnosis present

## 2011-05-27 DIAGNOSIS — M545 Low back pain, unspecified: Secondary | ICD-10-CM

## 2011-05-27 DIAGNOSIS — R569 Unspecified convulsions: Secondary | ICD-10-CM

## 2011-05-27 DIAGNOSIS — Z6834 Body mass index (BMI) 34.0-34.9, adult: Secondary | ICD-10-CM | POA: Diagnosis present

## 2011-05-27 DIAGNOSIS — E039 Hypothyroidism, unspecified: Secondary | ICD-10-CM | POA: Insufficient documentation

## 2011-05-27 DIAGNOSIS — I499 Cardiac arrhythmia, unspecified: Secondary | ICD-10-CM

## 2011-05-27 DIAGNOSIS — F329 Major depressive disorder, single episode, unspecified: Secondary | ICD-10-CM | POA: Insufficient documentation

## 2011-05-27 DIAGNOSIS — R0789 Other chest pain: Principal | ICD-10-CM | POA: Insufficient documentation

## 2011-05-27 DIAGNOSIS — I4819 Other persistent atrial fibrillation: Secondary | ICD-10-CM | POA: Diagnosis not present

## 2011-05-27 DIAGNOSIS — Z7982 Long term (current) use of aspirin: Secondary | ICD-10-CM | POA: Insufficient documentation

## 2011-05-27 DIAGNOSIS — F32A Depression, unspecified: Secondary | ICD-10-CM | POA: Diagnosis present

## 2011-05-27 DIAGNOSIS — E871 Hypo-osmolality and hyponatremia: Secondary | ICD-10-CM | POA: Diagnosis present

## 2011-05-27 HISTORY — DX: Disorder of thyroid, unspecified: E07.9

## 2011-05-27 HISTORY — DX: Unspecified osteoarthritis, unspecified site: M19.90

## 2011-05-27 HISTORY — DX: Unspecified convulsions: R56.9

## 2011-05-27 HISTORY — DX: Morbid (severe) obesity due to excess calories: E66.01

## 2011-05-27 HISTORY — DX: Cardiac arrhythmia, unspecified: I49.9

## 2011-05-27 HISTORY — DX: Major depressive disorder, single episode, unspecified: F32.9

## 2011-05-27 HISTORY — DX: Depression, unspecified: F32.A

## 2011-05-27 HISTORY — DX: Low back pain, unspecified: M54.50

## 2011-05-27 HISTORY — DX: Low back pain: M54.5

## 2011-05-27 LAB — DIFFERENTIAL
Basophils Absolute: 0.1 10*3/uL (ref 0.0–0.1)
Basophils Relative: 0 % (ref 0–1)
Eosinophils Absolute: 0.6 10*3/uL (ref 0.0–0.7)
Eosinophils Relative: 4 % (ref 0–5)
Lymphocytes Relative: 19 % (ref 12–46)
Lymphs Abs: 2.7 10*3/uL (ref 0.7–4.0)
Monocytes Absolute: 1.4 10*3/uL — ABNORMAL HIGH (ref 0.1–1.0)
Monocytes Relative: 10 % (ref 3–12)
Neutro Abs: 9.5 10*3/uL — ABNORMAL HIGH (ref 1.7–7.7)
Neutrophils Relative %: 67 % (ref 43–77)

## 2011-05-27 LAB — BASIC METABOLIC PANEL
BUN: 14 mg/dL (ref 6–23)
CO2: 28 mEq/L (ref 19–32)
Calcium: 9.5 mg/dL (ref 8.4–10.5)
Chloride: 96 mEq/L (ref 96–112)
Creatinine, Ser: 0.87 mg/dL (ref 0.50–1.35)
GFR calc Af Amer: >90 mL/min (ref >90)
GFR calc non Af Amer: >90 mL/min (ref >90)
Glucose, Bld: 134 mg/dL — ABNORMAL HIGH (ref 70–99)
Potassium: 4.2 mEq/L (ref 3.5–5.1)
Sodium: 133 mEq/L — ABNORMAL LOW (ref 135–145)

## 2011-05-27 LAB — CBC
HCT: 46.2 % (ref 39.0–52.0)
Hemoglobin: 15.7 g/dL (ref 13.0–17.0)
MCH: 30.6 pg (ref 26.0–34.0)
MCHC: 34 g/dL (ref 30.0–36.0)
MCV: 90.1 fL (ref 78.0–100.0)
Platelets: 238 10*3/uL (ref 150–400)
RBC: 5.13 MIL/uL (ref 4.22–5.81)
RDW: 13.9 % (ref 11.5–15.5)
WBC: 14.2 10*3/uL — ABNORMAL HIGH (ref 4.0–10.5)

## 2011-05-27 LAB — POCT I-STAT TROPONIN I: Troponin i, poc: 0 ng/mL (ref 0.00–0.08)

## 2011-05-27 LAB — CARDIAC PANEL(CRET KIN+CKTOT+MB+TROPI)
CK, MB: 2.3 ng/mL (ref 0.3–4.0)
Relative Index: INVALID (ref 0.0–2.5)
Total CK: 54 U/L (ref 7–232)
Troponin I: <0.3 ng/mL (ref <0.30)

## 2011-05-27 MED ORDER — HYDROCODONE-ACETAMINOPHEN 5-325 MG PO TABS
1.0000 | ORAL_TABLET | ORAL | Status: DC | PRN
  Administered 2011-05-28: 1 via ORAL
  Filled 2011-05-27: qty 1

## 2011-05-27 MED ORDER — DIVALPROEX SODIUM ER 500 MG PO TB24
500.0000 mg | ORAL_TABLET | Freq: Every day | ORAL | Status: DC
  Administered 2011-05-28: 500 mg via ORAL
  Filled 2011-05-27 (×2): qty 1

## 2011-05-27 MED ORDER — PERPHENAZINE 8 MG PO TABS
24.0000 mg | ORAL_TABLET | Freq: Every day | ORAL | Status: DC
  Administered 2011-05-28: 24 mg via ORAL
  Filled 2011-05-27 (×2): qty 1

## 2011-05-27 MED ORDER — PHENELZINE SULFATE 15 MG PO TABS
45.0000 mg | ORAL_TABLET | Freq: Two times a day (BID) | ORAL | Status: DC
  Administered 2011-05-28 (×2): 45 mg via ORAL
  Filled 2011-05-27 (×3): qty 3

## 2011-05-27 MED ORDER — ZOLPIDEM TARTRATE 5 MG PO TABS: 5.0000 mg | ORAL_TABLET | Freq: Every evening | ORAL | Status: DC | PRN

## 2011-05-27 MED ORDER — ASPIRIN 81 MG PO CHEW
324.0000 mg | CHEWABLE_TABLET | Freq: Once | ORAL | Status: AC
  Administered 2011-05-27: 324 mg via ORAL

## 2011-05-27 MED ORDER — ALLOPURINOL 300 MG PO TABS
300.0000 mg | ORAL_TABLET | Freq: Every day | ORAL | Status: DC
  Administered 2011-05-28 (×2): 300 mg via ORAL
  Filled 2011-05-27 (×2): qty 1

## 2011-05-27 MED ORDER — ACETAMINOPHEN 650 MG RE SUPP: 650.0000 mg | Freq: Four times a day (QID) | RECTAL | Status: DC | PRN

## 2011-05-27 MED ORDER — ONDANSETRON HCL 4 MG/2ML IJ SOLN: 4.0000 mg | Freq: Four times a day (QID) | INTRAMUSCULAR | Status: DC | PRN

## 2011-05-27 MED ORDER — IOHEXOL 300 MG/ML  SOLN: 100.0000 mL | Freq: Once | INTRAMUSCULAR | Status: AC | PRN

## 2011-05-27 MED ORDER — MORPHINE SULFATE 2 MG/ML IJ SOLN: 1.0000 mg | INTRAMUSCULAR | Status: DC | PRN

## 2011-05-27 MED ORDER — LEVOTHYROXINE SODIUM 137 MCG PO TABS
137.0000 ug | ORAL_TABLET | Freq: Every day | ORAL | Status: DC
Start: 2011-05-27 — End: 2011-05-28
  Administered 2011-05-28 (×2): 137 ug via ORAL
  Filled 2011-05-27 (×2): qty 1

## 2011-05-27 MED ORDER — BENZTROPINE MESYLATE 2 MG PO TABS
2.0000 mg | ORAL_TABLET | Freq: Every day | ORAL | Status: DC
  Administered 2011-05-28 (×2): 2 mg via ORAL
  Filled 2011-05-27 (×2): qty 1

## 2011-05-27 MED ORDER — ONDANSETRON HCL 4 MG/2ML IJ SOLN
4.0000 mg | Freq: Once | INTRAMUSCULAR | Status: AC
  Administered 2011-05-27: 4 mg via INTRAVENOUS
  Filled 2011-05-27: qty 2

## 2011-05-27 MED ORDER — OMEGA-3 FATTY ACIDS 1000 MG PO CAPS
1.0000 g | ORAL_CAPSULE | Freq: Two times a day (BID) | ORAL | Status: DC
  Administered 2011-05-28 (×2): 1 g via ORAL
  Filled 2011-05-27 (×3): qty 1

## 2011-05-27 MED ORDER — ENOXAPARIN SODIUM 40 MG/0.4ML ~~LOC~~ SOLN
40.0000 mg | SUBCUTANEOUS | Status: DC
  Administered 2011-05-28: 40 mg via SUBCUTANEOUS
  Filled 2011-05-27 (×2): qty 0.4

## 2011-05-27 MED ORDER — ONDANSETRON HCL 4 MG/2ML IJ SOLN: 4.0000 mg | Freq: Three times a day (TID) | INTRAMUSCULAR | Status: DC | PRN

## 2011-05-27 MED ORDER — BISACODYL 10 MG RE SUPP: 10.0000 mg | Freq: Every day | RECTAL | Status: DC | PRN

## 2011-05-27 MED ORDER — DICLOFENAC SODIUM 75 MG PO TBEC
75.0000 mg | DELAYED_RELEASE_TABLET | Freq: Two times a day (BID) | ORAL | Status: DC
  Administered 2011-05-28 (×2): 75 mg via ORAL
  Filled 2011-05-27 (×3): qty 1

## 2011-05-27 MED ORDER — ALUM & MAG HYDROXIDE-SIMETH 200-200-20 MG/5ML PO SUSP: 30.0000 mL | Freq: Four times a day (QID) | ORAL | Status: DC | PRN

## 2011-05-27 MED ORDER — ACETAMINOPHEN 325 MG PO TABS: 650.0000 mg | ORAL_TABLET | Freq: Four times a day (QID) | ORAL | Status: DC | PRN

## 2011-05-27 MED ORDER — ONDANSETRON HCL 4 MG PO TABS: 4.0000 mg | ORAL_TABLET | Freq: Four times a day (QID) | ORAL | Status: DC | PRN

## 2011-05-27 MED ORDER — MORPHINE SULFATE 4 MG/ML IJ SOLN
4.0000 mg | Freq: Once | INTRAMUSCULAR | Status: AC
  Administered 2011-05-27: 4 mg via INTRAVENOUS
  Filled 2011-05-27: qty 1

## 2011-05-27 MED ORDER — ATENOLOL 25 MG PO TABS
25.0000 mg | ORAL_TABLET | Freq: Two times a day (BID) | ORAL | Status: DC
  Administered 2011-05-28 (×2): 25 mg via ORAL
  Filled 2011-05-27 (×3): qty 1

## 2011-05-27 MED ORDER — SENNOSIDES-DOCUSATE SODIUM 8.6-50 MG PO TABS
1.0000 | ORAL_TABLET | Freq: Every day | ORAL | Status: DC | PRN
  Filled 2011-05-27: qty 1

## 2011-05-27 MED ORDER — HYDROMORPHONE HCL PF 1 MG/ML IJ SOLN: 1.0000 mg | INTRAMUSCULAR | Status: DC | PRN

## 2011-05-27 NOTE — ED Provider Notes (Addendum)
History     CSN: [id number] Arrival date & time: 05/27/2011  1:37 PM   First MD Initiated Contact with Patient 05/27/11 1514      Chief Complaint  Patient presents with  . Chest Pain  . Nausea   patient with a known history of hypertension, obesity, thyroid disease, history of arrhythmia. Percent with right-sided chest pain, and some right upper extremity pain. Today since 12 noon. He has had minimal nausea, minimal shortness of breath. No vomiting. He states he did have a stress test 5 years ago, but was unable to complete the treadmill test. He also, states his primary doctors at [facility] told him he would need to be on a "blood thinner." However, he did not opt to do this. Patient states that his pain is persistent approximately 5/10.  (Consider location/radiation/quality/duration/timing/severity/associated sxs/prior treatment) HPI  Past Medical History  Diagnosis Date  . Thyroid disease   . Arrhythmia   . Hypertension   . Seizures     History reviewed. No pertinent past surgical history.  Family History  Problem Relation Age of Onset  . Diabetes Father   . Diabetes Brother     History  Substance Use Topics  . Smoking status: Never Smoker   . Smokeless tobacco: Never Used  . Alcohol Use: No      Review of Systems  All other systems reviewed and are negative.    Allergies  Review of patient's allergies indicates no known allergies.  Home Medications   Current Outpatient Rx  Name Route Sig Dispense Refill  . ALLOPURINOL 300 MG PO TABS Oral Take 300 mg by mouth daily.      . ASPIRIN EC 81 MG PO TBEC Oral Take 162 mg by mouth 2 (two) times daily.      . ATENOLOL 25 MG PO TABS Oral Take 25 mg by mouth 2 (two) times daily.      Marland Kitchen BENZTROPINE MESYLATE 2 MG PO TABS Oral Take 2 mg by mouth daily.      Marland Kitchen DICLOFENAC SODIUM 75 MG PO TBEC Oral Take 75 mg by mouth 2 (two) times daily.      Marland Kitchen DIVALPROEX SODIUM 500 MG PO TB24 Oral Take 500 mg by mouth daily.      .  OMEGA-3 FATTY ACIDS 1000 MG PO CAPS Oral Take 1 g by mouth 2 (two) times daily.      Marland Kitchen LEVOTHYROXINE SODIUM 137 MCG PO TABS Oral Take 137 mcg by mouth daily.      Marland Kitchen PERPHENAZINE 8 MG PO TABS Oral Take 24 mg by mouth at bedtime.      Marland Kitchen PHENELZINE SULFATE 15 MG PO TABS Oral Take 45 mg by mouth 2 (two) times daily.        BP 122/79  Pulse 74  Temp(Src) 98.9 F (37.2 C) (Oral)  Resp 22  Ht 5\' 10"  (1.778 m)  Wt 285 lb (129.275 kg)  BMI 40.89 kg/m2  SpO2 99%  Physical Exam  Constitutional: He is oriented to person, place, and time. He appears well-developed and well-nourished.       Obese, no diaphoresis.  HENT:  Head: Normocephalic and atraumatic.  Eyes: Conjunctivae and EOM are normal. Pupils are equal, round, and reactive to light.  Neck: Neck supple.  Cardiovascular: Normal rate and regular rhythm.  Exam reveals no gallop and no friction rub.   No murmur heard. Pulmonary/Chest: Breath sounds normal. He has no wheezes. He has no rales. He exhibits no tenderness.  Abdominal: Soft. Bowel sounds are normal. He exhibits no distension. There is no tenderness. There is no rebound and no guarding.  Musculoskeletal: Normal range of motion.  Neurological: He is alert and oriented to person, place, and time. No cranial nerve deficit. Coordination normal.  Skin: Skin is warm and dry. No rash noted.  Psychiatric: He has a normal mood and affect.    ED Course  Procedures (including critical care time)  Labs Reviewed  CBC - Abnormal; Notable for the following:    WBC 14.2 (*)    All other components within normal limits  DIFFERENTIAL - Abnormal; Notable for the following:    Neutro Abs 9.5 (*)    Monocytes Absolute 1.4 (*)    All other components within normal limits  BASIC METABOLIC PANEL - Abnormal; Notable for the following:    Sodium 133 (*)    Glucose, Bld 134 (*)    All other components within normal limits  POCT I-STAT TROPONIN I  I-STAT TROPONIN I   Dg Chest 2  View  05/27/2011  *RADIOLOGY REPORT*  Clinical Data: Chest pain  CHEST - 2 VIEW  Comparison: 01/31/2007  Findings: The heart is upper normal in size.  Lungs are under aerated with bibasilar atelectasis.  Pulmonary vascularity is within normal limits.  No consolidation or mass.  No pneumothorax or pleural effusion.  IMPRESSION: No active cardiopulmonary disease other than mild basilar atelectasis.  Original Report Authenticated By: Donavan Burnet, M.D.     1. Chest pain       MDM  Pt is seen and examined;  Initial history and physical completed.  Will follow.          Kilo Eshelman A. Patrica Duel, MD 05/27/11 1528  Results for orders placed during the hospital encounter of 05/27/11  CBC      Component Value Range   WBC 14.2 (*) 4.0 - 10.5 (K/uL)   RBC 5.13  4.22 - 5.81 (MIL/uL)   Hemoglobin 15.7  13.0 - 17.0 (g/dL)   HCT 45.4  09.8 - 11.9 (%)   MCV 90.1  78.0 - 100.0 (fL)   MCH 30.6  26.0 - 34.0 (pg)   MCHC 34.0  30.0 - 36.0 (g/dL)   RDW 14.7  82.9 - 56.2 (%)   Platelets 238  150 - 400 (K/uL)  DIFFERENTIAL      Component Value Range   Neutrophils Relative 67  43 - 77 (%)   Neutro Abs 9.5 (*) 1.7 - 7.7 (K/uL)   Lymphocytes Relative 19  12 - 46 (%)   Lymphs Abs 2.7  0.7 - 4.0 (K/uL)   Monocytes Relative 10  3 - 12 (%)   Monocytes Absolute 1.4 (*) 0.1 - 1.0 (K/uL)   Eosinophils Relative 4  0 - 5 (%)   Eosinophils Absolute 0.6  0.0 - 0.7 (K/uL)   Basophils Relative 0  0 - 1 (%)   Basophils Absolute 0.1  0.0 - 0.1 (K/uL)  BASIC METABOLIC PANEL      Component Value Range   Sodium 133 (*) 135 - 145 (mEq/L)   Potassium 4.2  3.5 - 5.1 (mEq/L)   Chloride 96  96 - 112 (mEq/L)   CO2 28  19 - 32 (mEq/L)   Glucose, Bld 134 (*) 70 - 99 (mg/dL)   BUN 14  6 - 23 (mg/dL)   Creatinine, Ser 1.30  0.50 - 1.35 (mg/dL)   Calcium 9.5  8.4 - 86.5 (mg/dL)   GFR calc non  Af Amer >90  >90 (mL/min)   GFR calc Af Amer >90  >90 (mL/min)  POCT I-STAT TROPONIN I      Component Value Range   Troponin  i, poc 0.00  0.00 - 0.08 (ng/mL)   Comment 3            No results found.    Date: 05/27/2011  Rate: 70  Rhythm: sinus arrhythmia  QRS Axis: normal  Intervals: normal  ST/T Wave abnormalities: nonspecific ST changes  Conduction Disutrbances:none  Narrative Interpretation:   Old EKG Reviewed: no sig. changes     Nickalos Petersen A. Patrica Duel, MD 05/27/11 1530  Xrays or radiologic studies  reviewed by myself, interpreted by Radiologist.    NAME: KEELEN, QUEVEDO ACCOUNT NO.: [id number]  MEDICAL RECORD NO.: [id number] PATIENT TYPE: INP  LOCATION: 1413 FACILITY: [facility]  PHYSICIAN: Veverly Fells. Excell Seltzer, MD DATE OF BIRTH: 1960/09/25  DATE OF CONSULTATION: 02/01/2007  DATE OF DISCHARGE:  CONSULTATION  REQUESTING PHYSICIAN: [facility].  PRIMARY CARE PHYSICIAN: Dr. Orinda Kenner.  CARDIOLOGIST: Will be new, Dr. Tonny Bollman.  REASON FOR CONSULTATION: New onset atrial fibrillation.  HISTORY OF PRESENT ILLNESS: A 50 year old, obese, Caucasian male  brought by EMS after falling at home. The patient is a little confused  in his thought processes and family members have arrived during our  interview. Apparently the patient has not been taking his medications at  home as directed and had not been taking some of his thyroid medications  either according to his father. The patient states that he got up out of  his chair and fell. He did not actually pass out. He had called his  father several times prior to this admission a couple days before and  was very confused and they did not hear from him on the day of admission  and therefore they called the police and had them check on him. The  father says the police found him crawling on the floor unable to stand  up. They did call EMS and he was brought to the ER at [facility]. He  was found to be confused. He was complaining of right shoulder pain. The  ER physician evaluated him for meningitis with physical exam and blood  work. He did not have a  spinal tap. The patient is still mildly confused  this a.m. The physician who talked with him today noticed that his heart  rate was irregular, an EKG was completed and it showed atrial flutter  with a ventricular rate of 127 beats per minute. He was subsequently  placed on a Cardizem drip at 10 mg an hour and ordered a heparin drip.  On our evaluation, the patient had converted to normal sinus rhythm. The  patient did not complain of any shortness of breath, chest pain,  dizziness or diaphoresis although he is unable to remember very much  about his history. The patient apparently had had a history according to  his father of an irregular heart rate, atrial fibrillation in the past  secondary to thyroid abnormalities but is not followed by a  cardiologist. The patient also apparently as had a history of  hypertension and his blood pressure medication has not always been taken  as directed.  REVIEW OF SYSTEMS: Negative for chest pain, shortness of breath,  dizziness or diaphoresis. Positive for altered mental status and right  shoulder pain.  PAST MEDICAL HISTORY: Bipolar disorder, obsessive-compulsive disorder,  hypertension, gout and hypothyroidism.  SOCIAL HISTORY: The patient  live in [place] alone, he is not  employed. He is not married, he has no children. He does not smoke, he  does not drink, he does not use illicit drugs. He states both family  members, mother and father are alive and in good health.  CURRENT MEDICATIONS:  1. Cogentin (on hold).  2. Depakote 500 mg b.i.d.  3. Benztropine 2 mg once a day.  4. Atenolol 50 mg daily.  5. Rocephin IV 1 gram q.12 h.  6. Trilafon 8 mg t.i.d.  7. Synthroid 137 mcg once a day.  8. Nardil which is on hold.  ALLERGIES: No known drug allergies.  CURRENT LABS: Troponins are negative x3 at 0.04, 0.01 and 0.02. CK 204,  MB 1.3. Hemoglobin 13.9, hematocrit 40.3, white blood cells 14.3,  platelets 265. Sodium 139, potassium 3.6, chloride  100, CO2 30, BUN 13,  creatinine 0.96. Glucose 156.  EKG revealing atrial flutter with rate of 127 beats per minute.  Subsequent EKG reveals normal sinus rhythm with a heart rate of 84 beats  per minute. CT scan of the head no acute intracranial abnormalities.  Chest x-ray revealing no active disease. Cervical spine is normal. Right  shoulder x-ray no acute findings.  PHYSICAL EXAMINATION: Blood pressure 125/79, pulse 72, respirations 28,  temperature 98.4, O2 sat 95% on room air.  HEENT: Head is normocephalic, atraumatic. Eyes PERRLA. Mucous membranes  soft, pink and moist.  NECK: Obese, supple and without carotid bruits or JVD noted.  CARDIOVASCULAR: Distant heart sounds, regular rhythm and rate without  murmurs, rubs or gallops. Pulses are 2+ and equal bilaterally.  LUNGS: Bilateral mild crackles noted.  ABDOMEN: Very obese, nontender, with normal bowel sounds.  EXTREMITIES: Without clubbing, cyanosis or edema.  NEUROLOGIC: The patient had some tremors noted in his hands  bilaterally. He is also somewhat lethargic and has poor memory with some  confusion remaining.  IMPRESSION:  1. Paroxysmal atrial fib flutter converted to normal sinus rhythm on  Cardizem drip at 10 mg an hour. He had not been started on heparin  at the time of this evaluation.  2. History of hypertension.  3. Bipolar disorder with obsessive-compulsive disorder.  4. Hypothyroidism.  5. Altered mental status.  6. History of questionable medical noncompliance secondary to prior  number.  The patient was seen and examined by myself and Dr. Tonny Bollman in  the room. The patient's parents had just arrived as Dr. Excell Seltzer had  arrived as well. The patient's father states that he had had confusion 2-  3 days prior to admission and had called him not himself. The patient's  family called the police when they did not hear from him. On the day of  admission he was found on the floor crawling after he apparently had   fallen. The ER doctor is evaluating him for meningitis; however, no  spinal tap has been completed. We are asked to evaluate secondary to new  onset atrial fibrillation which has now converted to normal sinus  rhythm. EKG shows more atrial flutter than fibrillation. We agree with  Cardizem drip but will discontinue it and put him on p.o. Cardizem 180  mg daily. We will also discontinue heparin and place him on DVT  prophylaxis with Lovenox per pharmacy's recommendation of  weight and dosage. An echocardiogram will be completed. His TSH was  found to be normal at 0.507. After echo is completed, we will make  further recommendations based upon hospital course and patient's  response to  medical treatment.   Braeden Dolinski A. Patrica Duel, MD 05/27/11 1631  Results for orders placed during the hospital encounter of 05/27/11  CBC      Component Value Range   WBC 14.2 (*) 4.0 - 10.5 (K/uL)   RBC 5.13  4.22 - 5.81 (MIL/uL)   Hemoglobin 15.7  13.0 - 17.0 (g/dL)   HCT 16.1  09.6 - 04.5 (%)   MCV 90.1  78.0 - 100.0 (fL)   MCH 30.6  26.0 - 34.0 (pg)   MCHC 34.0  30.0 - 36.0 (g/dL)   RDW 40.9  81.1 - 91.4 (%)   Platelets 238  150 - 400 (K/uL)  DIFFERENTIAL      Component Value Range   Neutrophils Relative 67  43 - 77 (%)   Neutro Abs 9.5 (*) 1.7 - 7.7 (K/uL)   Lymphocytes Relative 19  12 - 46 (%)   Lymphs Abs 2.7  0.7 - 4.0 (K/uL)   Monocytes Relative 10  3 - 12 (%)   Monocytes Absolute 1.4 (*) 0.1 - 1.0 (K/uL)   Eosinophils Relative 4  0 - 5 (%)   Eosinophils Absolute 0.6  0.0 - 0.7 (K/uL)   Basophils Relative 0  0 - 1 (%)   Basophils Absolute 0.1  0.0 - 0.1 (K/uL)  BASIC METABOLIC PANEL      Component Value Range   Sodium 133 (*) 135 - 145 (mEq/L)   Potassium 4.2  3.5 - 5.1 (mEq/L)   Chloride 96  96 - 112 (mEq/L)   CO2 28  19 - 32 (mEq/L)   Glucose, Bld 134 (*) 70 - 99 (mg/dL)   BUN 14  6 - 23 (mg/dL)   Creatinine, Ser 7.82  0.50 - 1.35 (mg/dL)   Calcium 9.5  8.4 - 95.6 (mg/dL)   GFR calc  non Af Amer >90  >90 (mL/min)   GFR calc Af Amer >90  >90 (mL/min)  POCT I-STAT TROPONIN I      Component Value Range   Troponin i, poc 0.00  0.00 - 0.08 (ng/mL)   Comment 3            Dg Chest 2 View  05/27/2011  *RADIOLOGY REPORT*  Clinical Data: Chest pain  CHEST - 2 VIEW  Comparison: 01/31/2007  Findings: The heart is upper normal in size.  Lungs are under aerated with bibasilar atelectasis.  Pulmonary vascularity is within normal limits.  No consolidation or mass.  No pneumothorax or pleural effusion.  IMPRESSION: No active cardiopulmonary disease other than mild basilar atelectasis.  Original Report Authenticated By: Donavan Burnet, M.D.      Zyia Kaneko A. Patrica Duel, MD 05/27/11 2130

## 2011-05-27 NOTE — H&P (Signed)
Patient's PCP: Dr. [place]and Terasaki, at [facility]  Patient's cardiologist: Dr. Duard Brady, at [facility]  Chief Complaint: Right-sided chest pain  History of Present Illness: [name] is a 50 y.o. Caucasian male with history of arrhythmia uncertain what type of arrhythmia at this time, hypertension, hypothyroidism, depression, osteoarthritis, morbid obesity, and chronic low back pain who presents with the above complaints.  Patient reports that he had been in good health.  At 6 o'clock this morning he was trying to reach for something in the back of the car on his right side.  At 12 p.m. today while he was seated he started having sharp right-sided chest pain.  Some of the pain radiated to his right shoulder.  The pain was sharp and constant in nature.  The pain worsened with movement of his right hand.  Initially he applied heat pads to right-sided his chest as it was not helping then applied ice to right-sided his chest.  Given the patient has history of arrhythmia he decided to come to the emergency department for further evaluation.  Of note the patient comments that his cardiologist has been debating whether to start the patient on blood thinner given arrhythmia.  Patient denies any recent fevers, chills, shortness of breath.  Does complain of being nauseated today.  Denies any jaw pain.  Denies any pain to left hand and shoulder.  Denies any numbness and tingling in his hands.  Denies any abdominal pain.  Denies any diarrhea.  Denies any headaches or vision changes.  Meds: Scheduled Meds:   . aspirin  324 mg Oral Once  .  morphine injection  4 mg Intravenous Once  . ondansetron  4 mg Intravenous Once   Continuous Infusions:  PRN Meds:.HYDROmorphone (DILAUDID) injection, ondansetron (ZOFRAN) IV Allergies: Review of patient's allergies indicates no known allergies. Past Medical History  Diagnosis Date  . Thyroid disease   . Hypertension   . Seizures   .  Depression   . Arrhythmia   . Osteoarthritis     oa in bilateral knees  . Morbid obesity, BMI unknown   . Low back pain    Past Surgical History  Procedure Date  . Achilles tendon repair approx. 2004    right foot  . Cataract extraction     bilateral   Family History  Problem Relation Age of Onset  . Diabetes Father   . Heart disease Father   . Diabetes Brother    History   Social History  . Marital Status: Single    Spouse Name: N/A    Number of Children: N/A  . Years of Education: N/A   Occupational History  . Not on file.   Social History Main Topics  . Smoking status: Never Smoker   . Smokeless tobacco: Never Used  . Alcohol Use: No  . Drug Use: No  . Sexually Active: No   Other Topics Concern  . Not on file   Social History Narrative  . No narrative on file   Review of Systems: All systems reviewed with the patient and positive as per history of present illness, otherwise all other systems are negative.  Physical Exam: Blood pressure 122/79, pulse 74, temperature 98.9 F (37.2 C), temperature source Oral, resp. rate 22, height 5\' 10"  (1.778 m), weight 129.275 kg (285 lb), SpO2 99.00%. General: Awake, Oriented x3, No acute distress. HEENT: EOMI, Moist mucous membranes Neck: Supple CV: S1 and S2 Lungs: Clear to ascultation bilaterally Abdomen: Soft,  Nontender, Nondistended, +bowel sounds. Ext: Good pulses. Trace edema. No clubbing or cyanosis noted. Neuro: Cranial Nerves II-XII grossly intact. Has 5/5 motor strength in upper and lower extremities.   Lab results:  Basename 05/27/11 1430  NA 133*  K 4.2  CL 96  CO2 28  GLUCOSE 134*  BUN 14  CREATININE 0.87  CALCIUM 9.5  MG --  PHOS --   No results found for this basename: AST:2,ALT:2,ALKPHOS:2,BILITOT:2,PROT:2,ALBUMIN:2 in the last 72 hours No results found for this basename: LIPASE:2,AMYLASE:2 in the last 72 hours  Basename 05/27/11 1430  WBC 14.2*  NEUTROABS 9.5*  HGB 15.7  HCT 46.2    MCV 90.1  PLT 238   No results found for this basename: CKTOTAL:3,CKMB:3,CKMBINDEX:3,TROPONINI:3 in the last 72 hours No results found for this basename: POCBNP:3 in the last 72 hours No results found for this basename: DDIMER in the last 72 hours No results found for this basename: HGBA1C:2 in the last 72 hours No results found for this basename: CHOL:2,HDL:2,LDLCALC:2,TRIG:2,CHOLHDL:2,LDLDIRECT:2 in the last 72 hours No results found for this basename: TSH,T4TOTAL,FREET3,T3FREE,THYROIDAB in the last 72 hours No results found for this basename: VITAMINB12:2,FOLATE:2,FERRITIN:2,TIBC:2,IRON:2,RETICCTPCT:2 in the last 72 hours Imaging results:  Dg Chest 2 View  05/27/2011  *RADIOLOGY REPORT*  Clinical Data: Chest pain  CHEST - 2 VIEW  Comparison: 01/31/2007  Findings: The heart is upper normal in size.  Lungs are under aerated with bibasilar atelectasis.  Pulmonary vascularity is within normal limits.  No consolidation or mass.  No pneumothorax or pleural effusion.  IMPRESSION: No active cardiopulmonary disease other than mild basilar atelectasis.  Original Report Authenticated By: Donavan Burnet, M.D.   Other results: EKG: normal EKG, normal sinus rhythm, there are no previous tracings available for comparison.  Assessment & Plan by Problem: 1. Chest pain - Will admit the patient to telemetry to rule out for acute coronary syndrome.  Will trend troponins.  Initial EKG obtained in the ER shows normal sinus rhythm.  Will get a CT of the chest with contrast to rule out pulmonary embolism.  Patient will be admitted as observation.  If CT of the chest with contrast is negative for pulmonary embolism ,and if ruled out for acute coronary syndrome could consider discharging the patient to his cardiologist for further evaluation.  Patient reports that he had a stress test about 5 years ago which was negative.  Continue beta blocker and aspirin at full dose.  Chest pain is likely musculoskeletal because  the pain was reproducible with movement.  2. History of arrhythmia.  Uncertain what his arrhythmia was, may be A. fib.  Patient currently in normal sinus rhythm.  3. Mild hyponatremia.  Monitor for now likely due to pain.  4. Leukocytosis.  Likely reactive leukocytosis and monitor for now.  5. Hypertension.  Stable at this time continue home medications with hold parameters.  6.  Hypothyroidism.  Continue home medication.  7. history of seizure disorder.  Stable, continue home medication.  8. history of depression.  Stable, continue home medications.  9.  Morbid obesity.  Diet and exercise as outpatient.  10.  Prophylaxis.  Lovenox for DVT prophylaxis.  11.  CODE STATUS.  Full code this was discussed with the patient at the time of admission.  Time spent on admission, talking to the patient, and coordinating care was: 60 mins.  Maggy Wyble A, MD 05/27/2011, 6:38 PM

## 2011-05-27 NOTE — H&P (Deleted)
Error

## 2011-05-27 NOTE — ED Notes (Signed)
Pt states he is having a shoting pain  From the right side of his chest. Pt also states the pain has made him nauseated

## 2011-05-28 LAB — LIPID PANEL
Cholesterol: 181 mg/dL (ref 0–200)
HDL: 24 mg/dL — ABNORMAL LOW (ref >39)
LDL Cholesterol: 118 mg/dL — ABNORMAL HIGH (ref 0–99)
Total CHOL/HDL Ratio: 7.5 RATIO
Triglycerides: 197 mg/dL — ABNORMAL HIGH (ref <150)
VLDL: 39 mg/dL (ref 0–40)

## 2011-05-28 LAB — COMPREHENSIVE METABOLIC PANEL
ALT: 8 U/L (ref 0–53)
AST: 11 U/L (ref 0–37)
Albumin: 3.4 g/dL — ABNORMAL LOW (ref 3.5–5.2)
Alkaline Phosphatase: 109 U/L (ref 39–117)
BUN: 10 mg/dL (ref 6–23)
CO2: 31 mEq/L (ref 19–32)
Calcium: 9.2 mg/dL (ref 8.4–10.5)
Chloride: 99 mEq/L (ref 96–112)
Creatinine, Ser: 0.87 mg/dL (ref 0.50–1.35)
GFR calc Af Amer: >90 mL/min (ref >90)
GFR calc non Af Amer: >90 mL/min (ref >90)
Glucose, Bld: 100 mg/dL — ABNORMAL HIGH (ref 70–99)
Potassium: 4.2 mEq/L (ref 3.5–5.1)
Sodium: 134 mEq/L — ABNORMAL LOW (ref 135–145)
Total Bilirubin: 0.5 mg/dL (ref 0.3–1.2)
Total Protein: 6.1 g/dL (ref 6.0–8.3)

## 2011-05-28 LAB — CARDIAC PANEL(CRET KIN+CKTOT+MB+TROPI)
CK, MB: 2.2 ng/mL (ref 0.3–4.0)
CK, MB: 2.2 ng/mL (ref 0.3–4.0)
Relative Index: INVALID (ref 0.0–2.5)
Relative Index: INVALID (ref 0.0–2.5)
Total CK: 49 U/L (ref 7–232)
Total CK: 51 U/L (ref 7–232)
Troponin I: <0.3 ng/mL (ref <0.30)
Troponin I: <0.3 ng/mL (ref <0.30)

## 2011-05-28 MED ORDER — IBUPROFEN 200 MG PO TABS: 400.0000 mg | ORAL_TABLET | Freq: Three times a day (TID) | ORAL | Status: AC | PRN

## 2011-05-28 NOTE — Discharge Summary (Signed)
Physician Discharge Summary  Patient ID: BREYLIN DOM MRN: [id number] DOB/AGE: 1961-01-30 50 y.o.  Admit date: 05/27/2011 Discharge date: 05/28/2011  Patient's PCP: Dr. [place] Terasaki, at [facility]  Patient's cardiologist: Dr. Duard Brady, at [facility]  Discharge Diagnoses:   1.Right shoulder and Chest pain :musculoskeletal 2. DEPRESSION 3. HYPERTENSION 4. Thyroid disease 5.  Seizures 6. Arrhythmia 7. Osteoarthritis 8.Obesity, Class III, BMI 40-49.9 (morbid obesity) 9. Low back pain   Current Discharge Medication List    START taking these medications   Details  ibuprofen (MOTRIN IB) 200 MG tablet Take 2 tablets (400 mg total) by mouth every 8 (eight) hours as needed for pain. For 3 days Qty: 10 tablet, Refills: 0      CONTINUE these medications which have NOT CHANGED   Details  allopurinol (ZYLOPRIM) 300 MG tablet Take 300 mg by mouth daily.      aspirin EC 81 MG tablet Take 162 mg by mouth 2 (two) times daily.      atenolol (TENORMIN) 25 MG tablet Take 25 mg by mouth 2 (two) times daily.      benztropine (COGENTIN) 2 MG tablet Take 2 mg by mouth daily.      divalproex (DEPAKOTE ER) 500 MG 24 hr tablet Take 500 mg by mouth daily.      fish oil-omega-3 fatty acids 1000 MG capsule Take 1 g by mouth 2 (two) times daily.      levothyroxine (SYNTHROID, LEVOTHROID) 137 MCG tablet Take 137 mcg by mouth daily.      perphenazine (TRILAFON) 8 MG tablet Take 24 mg by mouth at bedtime.      phenelzine (NARDIL) 15 MG tablet Take 45 mg by mouth 2 (two) times daily.        STOP taking these medications     diclofenac (VOLTAREN) 75 MG EC tablet          Disposition and Follow-up:  PCP: dr. Secundino Ginger in 2 weeks Cardiologist: Dr. Orson Aloe in 2-3 weeks  Significant Diagnostic Studies:  Dg Chest 2 View  05/27/2011  *RADIOLOGY REPORT*  Clinical Data: Chest pain  CHEST - 2 VIEW  Comparison: 01/31/2007  Findings: The heart is upper  normal in size.  Lungs are under aerated with bibasilar atelectasis.  Pulmonary vascularity is within normal limits.  No consolidation or mass.  No pneumothorax or pleural effusion.  IMPRESSION: No active cardiopulmonary disease other than mild basilar atelectasis.  Original Report Authenticated By: Donavan Burnet, M.D.   Ct Angio Chest W/cm &/or Wo Cm  05/27/2011  *RADIOLOGY REPORT*  Clinical Data:  Right chest pain, shortness of breath  CT ANGIOGRAPHY CHEST WITH CONTRAST  Technique:  Multidetector CT imaging of the chest was performed using the standard protocol during bolus administration of intravenous contrast.  Multiplanar CT image reconstructions including MIPs were obtained to evaluate the vascular anatomy.  Contrast:  100 ml Omnipaque-300 IV  Comparison:  Chest radiographs dated 05/27/2011  Findings:  No evidence of pulmonary embolism.  Mild dependent atelectasis/scarring in the bilateral lung bases. No suspicious pulmonary nodules. No pleural effusion or pneumothorax.  The visualized thyroid is unremarkable.  The heart is normal in size.  No pericardial effusion.  No suspicious mediastinal, hilar, or axillary lymphadenopathy.  Visualized upper abdomen is unremarkable.  Degenerative changes of the visualized thoracolumbar spine.  Review of the MIP images confirms the above findings.  IMPRESSION: No evidence of pulmonary embolism.  No evidence of acute cardiopulmonary disease.  Original Report  Authenticated By: Charline Bills, M.D.    Brief H and P: Mr.Merkel is a 50 year old male with h/o depression, seizure disorder and Hypertension was in his usual state of health till yesterday noon, when he started experiencing pain in his right chest and shoulder, was sharp in nature, non radiating, and reproducible with palpation. He had apparently bent over and pulled/ carried his thrash bag about 5-6 hours prior to onset of this.  Hospital Course:  1.Chest / shoulder pain was felt to be musculoskeletal  in etiology based on nature, reproducibility and tenderness to palpation, however was admitted to the hospital, ruled out for MI based on normal EKG and 3 sets of cardiac enzymes, also had a CTA of chest that was negative for pulmonary embolism or dissection. He did not have any arrhythmias on the monitor. His chest pain has improved, now a 2/10 primarily with movement of the right arm and shoulder. Recommended to use heating pads and Ibuprofen PRN for 3-4 days. Also asked to follow up with PCP and Cardiologist at [facility] in 2-3 weeks, or sooner with new or worrisome symptoms  Time spent on Discharge:  Signed: Nowell Sites 05/28/2011, 9:51 AM

## 2011-05-28 NOTE — Progress Notes (Signed)
Patient discharge to home, alert and oriented, no distress noted, no complaints of chest pains at this time. PIV removed no s/s of infection or infiltration noted.D/c instruction discussed and given to patient.

## 2011-12-07 ENCOUNTER — Emergency Department (HOSPITAL_COMMUNITY)
Admission: EM | Admit: 2011-12-07 | Discharge: 2011-12-07 | Disposition: A | Discharge status: Home / Self Care | Payer: Medicare Other | Attending: Emergency Medicine | Admitting: Emergency Medicine

## 2011-12-07 ENCOUNTER — Encounter (HOSPITAL_COMMUNITY): Payer: Self-pay | Admitting: *Deleted

## 2011-12-07 DIAGNOSIS — Y9389 Activity, other specified: Secondary | ICD-10-CM | POA: Insufficient documentation

## 2011-12-07 DIAGNOSIS — S99929A Unspecified injury of unspecified foot, initial encounter: Secondary | ICD-10-CM | POA: Insufficient documentation

## 2011-12-07 DIAGNOSIS — L0390 Cellulitis, unspecified: Secondary | ICD-10-CM

## 2011-12-07 DIAGNOSIS — L02419 Cutaneous abscess of limb, unspecified: Secondary | ICD-10-CM | POA: Insufficient documentation

## 2011-12-07 DIAGNOSIS — M1710 Unilateral primary osteoarthritis, unspecified knee: Secondary | ICD-10-CM | POA: Insufficient documentation

## 2011-12-07 DIAGNOSIS — I10 Essential (primary) hypertension: Secondary | ICD-10-CM | POA: Insufficient documentation

## 2011-12-07 DIAGNOSIS — S8990XA Unspecified injury of unspecified lower leg, initial encounter: Secondary | ICD-10-CM | POA: Insufficient documentation

## 2011-12-07 DIAGNOSIS — Y998 Other external cause status: Secondary | ICD-10-CM | POA: Insufficient documentation

## 2011-12-07 DIAGNOSIS — E079 Disorder of thyroid, unspecified: Secondary | ICD-10-CM | POA: Insufficient documentation

## 2011-12-07 DIAGNOSIS — L03119 Cellulitis of unspecified part of limb: Secondary | ICD-10-CM | POA: Insufficient documentation

## 2011-12-07 MED ORDER — CLINDAMYCIN HCL 150 MG PO CAPS: 150.0000 mg | ORAL_CAPSULE | Freq: Four times a day (QID) | ORAL | Status: AC

## 2011-12-07 MED ORDER — ONDANSETRON HCL 4 MG PO TABS: 4.0000 mg | ORAL_TABLET | Freq: Four times a day (QID) | ORAL | Status: DC

## 2011-12-07 MED ORDER — TETANUS-DIPHTH-ACELL PERTUSSIS 5-2.5-18.5 LF-MCG/0.5 IM SUSP
0.5000 mL | Freq: Once | INTRAMUSCULAR | Status: AC
  Administered 2011-12-07: 0.5 mL via INTRAMUSCULAR
  Filled 2011-12-07: qty 0.5

## 2011-12-07 MED ORDER — CLINDAMYCIN HCL 300 MG PO CAPS
300.0000 mg | ORAL_CAPSULE | Freq: Once | ORAL | Status: AC
  Administered 2011-12-07: 300 mg via ORAL
  Filled 2011-12-07: qty 1

## 2011-12-07 MED ORDER — ONDANSETRON 4 MG PO TBDP
4.0000 mg | ORAL_TABLET | Freq: Once | ORAL | Status: AC
  Administered 2011-12-07: 4 mg via ORAL
  Filled 2011-12-07: qty 1
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
## 2011-12-07 NOTE — Discharge Instructions (Signed)
Cellulitis Cellulitis is an infection of the skin and the tissue beneath it. The area is typically red and tender. It is caused by germs (bacteria) (usually staph or strep) that enter the body through cuts or sores. Cellulitis most commonly occurs in the arms or lower legs.  HOME CARE INSTRUCTIONS   If you are given a prescription for medications which kill germs (antibiotics), take as directed until finished.   If the infection is on the arm or leg, keep the limb elevated as able.   Use a warm cloth several times per day to relieve pain and encourage healing.   See your caregiver for recheck of the infected site as directed if problems arise.   Only take over-the-counter or prescription medicines for pain, discomfort, or fever as directed by your caregiver.  SEEK MEDICAL CARE IF:   The area of redness (inflammation) is spreading, there are red streaks coming from the infected site, or if a part of the infection begins to turn dark in color.   The joint or bone underneath the infected skin becomes painful after the skin has healed.   The infection returns in the same or another area after it seems to have gone away.   A boil or bump swells up. This may be an abscess.   New, unexplained problems such as pain or fever develop.  SEEK IMMEDIATE MEDICAL CARE IF:   You have a fever.   You or your child feels drowsy or lethargic.   There is vomiting, diarrhea, or lasting discomfort or feeling ill (malaise) with muscle aches and pains.  MAKE SURE YOU:   Understand these instructions.   Will watch your condition.   Will get help right away if you are not doing well or get worse.  Document Released: 04/01/2005 Document Revised: 06/11/2011 Document Reviewed: 02/08/2008 [facility] Patient Information 2012 [place], [place].Athletic Injuries Proper early treatment and rehabilitation leads to a quicker recovery for most athletic injuries. You may be able to return to your sport fully recovered  in less time if you follow these general rules:   Rest. Rest the injury until movement is no longer painful. Using an injured joint or muscle will prolong the problem.   Elevate. Keep the injured area elevated until most of the swelling and pain are gone. If possible, keep the injured area above the level of your heart.   Ice. Use ice packs directly on the injury for 3 to 4 days.   Compression. Use an elastic bandage applied to your injury as directed. This will reduce swelling, although elastic wraps do not protect injured joints. More rigid splints and taping are better for this purpose.   Rehabilitation. This should begin as soon as the swelling and pain of your injury subside, and as directed by your caregiver. It includes exercises to improve joint motion and muscular strength. Occasionally special braces, splints, or orthotics are used to protect against further injury when you return to your sport.  Keeping a positive attitude will help you heal your injury more rapidly and completely. You may return to physical exercise that does not cause pain or increase the risk of re-injury or as directed. This will help maintain fitness. It will also improve your mental attitude. Do not overuse your injured extremity. This will lead to discomfort and may delay full recovery.  Document Released: 07/30/2004 Document Revised: 06/11/2011 Document Reviewed: 12/18/2008 [facility] Patient Information 2012 [place], [place].Skin Infections A skin infection usually develops as a result of disruption of  the skin barrier.  CAUSES  A skin infection might occur following:  Trauma or an injury to the skin such as a cut or insect sting.   Inflammation (as in eczema).   Breaks in the skin between the toes (as in athlete's foot).   Swelling (edema).  SYMPTOMS  The legs are the most common site affected. Usually there is:  Redness.   Swelling.   Pain.   There may be red streaks in the area of the infection.    TREATMENT   Minor skin infections may be treated with topical antibiotics, but if the skin infection is severe, hospital care and intravenous (IV) antibiotic treatment may be needed.   Most often skin infections can be treated with oral antibiotic medicine as well as proper rest and elevation of the affected area until the infection improves.   If you are prescribed oral antibiotics, it is important to take them as directed and to take all the pills even if you feel better before you have finished all of the medicine.   You may apply warm compresses to the area for 20-30 minutes 4 times daily.  You might need a tetanus shot now if:  You have no idea when you had the last one.   You have never had a tetanus shot before.   Your wound had dirt in it.  If you need a tetanus shot and you decide not to get one, there is a rare chance of getting tetanus. Sickness from tetanus can be serious. If you get a tetanus shot, your arm may swell and become red and warm at the shot site. This is common and not a problem. SEEK MEDICAL CARE IF:  The pain and swelling from your infection do not improve within 2 days.  SEEK IMMEDIATE MEDICAL CARE IF:  You develop a fever, chills, or other serious problems.  Document Released: 07/30/2004 Document Revised: 06/11/2011 Document Reviewed: 06/11/2008 [facility] Patient Information 2012 [place], [place].Multiple Traumas Bruises and sore muscles are common after injuries. These usually feel worse for the first 24 hours. You may have more stiffness and soreness over the next several hours to several days. It may be worse when you wake up the first morning following your injury. You will improve with each passing day. The amount of improvement often depends on the amount of damage done. HOME CARE INSTRUCTIONS   Put ice on the injured area.   Put ice in a plastic bag.   Place a towel between your skin and the bag.   Leave the ice on for 15 to 20 minutes, 3 to 4 times  a day.   Drink more fluids than normal. Do not drink alcohol.   Take a hot or warm shower or bath once or twice a day. This will increase blood flow to sore muscles.   Return to activity as tolerated. Lifting may aggravate neck or back pain.   Only take over-the-counter or prescription medicines for pain, discomfort, or fever as directed by your caregiver. Do not use aspirin. This may increase bruising because aspirin decreases the ability of blood to form clots that stop bleeding.  SEEK IMMEDIATE MEDICAL CARE IF:  You have numbness, tingling, weakness, or problems with the use of your arms or legs.   You have severe headaches not relieved with medicines.   You have changes in bowel or bladder control.   You develop increasing pain in any areas of the body.   You have shortness of breath,  dizziness, or fainting.   You have a fever.   You feel sick to your stomach (nauseous), throw up (vomit), or sweat.   You have increasing belly pain.   You have blood in your urine, stool, or vomit.   You have pain in either shoulder or in an area where a shoulder strap would be.   You feel lightheaded or faint.   You feel you may be getting worse rather than better.   Your symptoms are worsening.  MAKE SURE YOU:   Understand these instructions.   Will watch your condition.   Will get help right away if you are not doing well or get worse.  Document Released: 07/12/2007 Document Revised: 06/11/2011 Document Reviewed: 12/10/2008 [facility] Patient Information 2012 [place], [place].
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
## 2011-12-08 NOTE — ED Provider Notes (Signed)
Medical screening examination/treatment/procedure(s) were performed by non-physician practitioner and as supervising physician I was immediately available for consultation/collaboration.   Kadesia Robel M Jasmeen Fritsch, MD 12/08/11 0023 

## 2011-12-10 ENCOUNTER — Encounter (HOSPITAL_COMMUNITY): Payer: Self-pay | Admitting: Family Medicine

## 2011-12-10 ENCOUNTER — Emergency Department (HOSPITAL_COMMUNITY)
Admission: EM | Admit: 2011-12-10 | Discharge: 2011-12-10 | Disposition: A | Discharge status: Home / Self Care | Payer: Medicare Other | Attending: Emergency Medicine | Admitting: Emergency Medicine

## 2011-12-10 DIAGNOSIS — IMO0002 Reserved for concepts with insufficient information to code with codable children: Secondary | ICD-10-CM | POA: Insufficient documentation

## 2011-12-10 DIAGNOSIS — I10 Essential (primary) hypertension: Secondary | ICD-10-CM | POA: Insufficient documentation

## 2011-12-10 DIAGNOSIS — M1710 Unilateral primary osteoarthritis, unspecified knee: Secondary | ICD-10-CM | POA: Insufficient documentation

## 2011-12-10 DIAGNOSIS — L0390 Cellulitis, unspecified: Secondary | ICD-10-CM

## 2011-12-10 DIAGNOSIS — E079 Disorder of thyroid, unspecified: Secondary | ICD-10-CM | POA: Insufficient documentation

## 2011-12-10 DIAGNOSIS — Z4801 Encounter for change or removal of surgical wound dressing: Secondary | ICD-10-CM | POA: Insufficient documentation
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
## 2011-12-10 NOTE — Discharge Instructions (Signed)
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
## 2011-12-10 NOTE — ED Notes (Signed)
Pt out to nurses desk stating if he doesn't get seen in the next 5 minutes he is going to leave. Brigitte, PA notified and at bedside speaking with pt.

## 2011-12-12 NOTE — ED Provider Notes (Signed)
Medical screening examination/treatment/procedure(s) were performed by non-physician practitioner and as supervising physician I was immediately available for consultation/collaboration.   Forbes Cellar, MD 12/12/11 0830

## 2011-12-19 ENCOUNTER — Encounter (HOSPITAL_COMMUNITY): Payer: Self-pay | Admitting: Family Medicine

## 2011-12-19 ENCOUNTER — Emergency Department (HOSPITAL_COMMUNITY)
Admission: EM | Admit: 2011-12-19 | Discharge: 2011-12-19 | Disposition: A | Discharge status: Home / Self Care | Payer: Medicare Other | Attending: Emergency Medicine | Admitting: Emergency Medicine

## 2011-12-19 DIAGNOSIS — R609 Edema, unspecified: Secondary | ICD-10-CM | POA: Insufficient documentation

## 2011-12-19 DIAGNOSIS — S81801A Unspecified open wound, right lower leg, initial encounter: Secondary | ICD-10-CM

## 2011-12-19 DIAGNOSIS — X58XXXA Exposure to other specified factors, initial encounter: Secondary | ICD-10-CM | POA: Insufficient documentation

## 2011-12-19 DIAGNOSIS — I83893 Varicose veins of bilateral lower extremities with other complications: Secondary | ICD-10-CM | POA: Insufficient documentation

## 2011-12-19 DIAGNOSIS — S8010XA Contusion of unspecified lower leg, initial encounter: Secondary | ICD-10-CM | POA: Insufficient documentation

## 2011-12-19 DIAGNOSIS — I872 Venous insufficiency (chronic) (peripheral): Secondary | ICD-10-CM | POA: Insufficient documentation

## 2011-12-19 NOTE — Discharge Instructions (Signed)
Please read and follow all provided instructions.  Your diagnoses today include:  1. Leg wound, right     Tests performed today include:  Vital signs. See below for your results today.   Medications prescribed:   None  Home care instructions:  Follow any educational materials contained in this packet. Keep affected area above the level of your heart when possible. Wash area gently twice a day with warm soapy water. Do not apply alcohol or hydrogen peroxide. Cover the area if it draining or weeping.   Follow-up instructions: Please follow-up with your primary care provider in the next 1 week for further evaluation of your symptoms. If you do not have a primary care doctor -- see below for referral information.   Return instructions:  Return to the Emergency Department if you have:  Fever  Worsening symptoms  Worsening pain  Worsening swelling  Redness of the skin that moves away from the affected area, especially if it streaks away from the affected area   Any other emergent concerns  Your vital signs today were: BP 139/73  Pulse 75  Temp 98.3 F (36.8 C) (Oral)  Resp 16  SpO2 95% If your blood pressure (BP) was elevated above 135/85 this visit, please have this repeated by your doctor within one month. -------------- No Primary Care Doctor Call [facility]  [phone] Other agencies that provide inexpensive medical care    [facility]  [phone]    [facility]  [phone]    [facility]  [phone]    [facility]  [phone]    [facility]  [phone]    [facility]  [phone] -------------- RESOURCE GUIDE:  Dental Problems  Patients with Medicaid: [facility] [phone]                                            [phone] [address] [employer] [address] Phone:  [phone]                                                   Phone:  [phone]  If unable to pay or  uninsured, contact:  [facility] or [facility]. to become qualified for the adult dental clinic.  Chronic Pain Problems Contact [facility]  [phone] Patients need to be referred by their primary care doctor.  Insufficient Money for Medicine Contact [employer]:  call "211" or [facility] [phone].  Psychological Services [facility]  [phone] [facility]  [phone] [facility]   [phone] (emergency services [phone])  Substance Abuse Resources [facility]  [phone] [facility] [phone] [facility] [phone] Floydene Flock [phone] Residential & Outpatient Substance Abuse Program  [phone]  Abuse/Neglect [facility] Child Abuse Hotline [phone] [facility] Child Abuse Hotline [phone] (After Hours)  Emergency Shelter [facility] [phone]  Maternity Homes [facility] [place] [facility] [phone] [facility] [phone]  [facility]  [facility] [place] [facility] [place] [facility] [address] [place]                       [address]      [address]  Blondell Reveal Phone:  [phone]                                   Phone:  [phone]                 Phone:  [phone]  [facility] Phone:  [phone]  [facility] Child Abuse Hotline [phone] (After Hours)
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
## 2011-12-19 NOTE — ED Provider Notes (Signed)
Medical screening examination/treatment/procedure(s) were performed by non-physician practitioner and as supervising physician I was immediately available for consultation/collaboration.  Olivia Mackie, MD 12/19/11 2141

## 2012-10-26 ENCOUNTER — Encounter (INDEPENDENT_AMBULATORY_CARE_PROVIDER_SITE_OTHER): Payer: Medicare Other | Admitting: Ophthalmology

## 2012-10-26 DIAGNOSIS — I10 Essential (primary) hypertension: Secondary | ICD-10-CM

## 2012-10-26 DIAGNOSIS — H35039 Hypertensive retinopathy, unspecified eye: Secondary | ICD-10-CM

## 2012-10-26 DIAGNOSIS — H43819 Vitreous degeneration, unspecified eye: Secondary | ICD-10-CM

## 2013-05-29 ENCOUNTER — Emergency Department (HOSPITAL_COMMUNITY)
Admission: EM | Admit: 2013-05-29 | Discharge: 2013-05-29 | Disposition: A | Discharge status: Home / Self Care | Payer: Medicare Other | Attending: Emergency Medicine | Admitting: Emergency Medicine

## 2013-05-29 ENCOUNTER — Encounter (HOSPITAL_COMMUNITY): Payer: Self-pay | Admitting: Emergency Medicine

## 2013-05-29 DIAGNOSIS — G40909 Epilepsy, unspecified, not intractable, without status epilepticus: Secondary | ICD-10-CM | POA: Insufficient documentation

## 2013-05-29 DIAGNOSIS — Z7982 Long term (current) use of aspirin: Secondary | ICD-10-CM | POA: Insufficient documentation

## 2013-05-29 DIAGNOSIS — Z8659 Personal history of other mental and behavioral disorders: Secondary | ICD-10-CM | POA: Insufficient documentation

## 2013-05-29 DIAGNOSIS — M1710 Unilateral primary osteoarthritis, unspecified knee: Secondary | ICD-10-CM | POA: Insufficient documentation

## 2013-05-29 DIAGNOSIS — E079 Disorder of thyroid, unspecified: Secondary | ICD-10-CM | POA: Insufficient documentation

## 2013-05-29 DIAGNOSIS — Z791 Long term (current) use of non-steroidal anti-inflammatories (NSAID): Secondary | ICD-10-CM | POA: Insufficient documentation

## 2013-05-29 DIAGNOSIS — I10 Essential (primary) hypertension: Secondary | ICD-10-CM | POA: Insufficient documentation

## 2013-05-29 DIAGNOSIS — T50905A Adverse effect of unspecified drugs, medicaments and biological substances, initial encounter: Secondary | ICD-10-CM

## 2013-05-29 DIAGNOSIS — IMO0002 Reserved for concepts with insufficient information to code with codable children: Secondary | ICD-10-CM | POA: Insufficient documentation

## 2013-05-29 DIAGNOSIS — T43205A Adverse effect of unspecified antidepressants, initial encounter: Secondary | ICD-10-CM | POA: Insufficient documentation

## 2013-05-29 DIAGNOSIS — Z79899 Other long term (current) drug therapy: Secondary | ICD-10-CM | POA: Insufficient documentation

## 2013-05-29 DIAGNOSIS — R42 Dizziness and giddiness: Secondary | ICD-10-CM | POA: Insufficient documentation

## 2013-05-29 LAB — COMPREHENSIVE METABOLIC PANEL
ALT: 10 U/L (ref 0–53)
AST: 19 U/L (ref 0–37)
Albumin: 3.9 g/dL (ref 3.5–5.2)
Alkaline Phosphatase: 98 U/L (ref 39–117)
BUN: 17 mg/dL (ref 6–23)
CO2: 28 mEq/L (ref 19–32)
Calcium: 9.4 mg/dL (ref 8.4–10.5)
Chloride: 97 mEq/L (ref 96–112)
Creatinine, Ser: 0.91 mg/dL (ref 0.50–1.35)
GFR calc Af Amer: >90 mL/min (ref >90)
GFR calc non Af Amer: >90 mL/min (ref >90)
Glucose, Bld: 169 mg/dL — ABNORMAL HIGH (ref 70–99)
Potassium: 4.6 mEq/L (ref 3.5–5.1)
Sodium: 137 mEq/L (ref 135–145)
Total Bilirubin: 0.5 mg/dL (ref 0.3–1.2)
Total Protein: 7.2 g/dL (ref 6.0–8.3)

## 2013-05-29 LAB — CBC WITH DIFFERENTIAL/PLATELET
Basophils Absolute: 0 10*3/uL (ref 0.0–0.1)
Basophils Relative: 0 % (ref 0–1)
Eosinophils Absolute: 0.4 10*3/uL (ref 0.0–0.7)
Eosinophils Relative: 3 % (ref 0–5)
HCT: 46.2 % (ref 39.0–52.0)
Hemoglobin: 16 g/dL (ref 13.0–17.0)
Lymphocytes Relative: 19 % (ref 12–46)
Lymphs Abs: 2.4 10*3/uL (ref 0.7–4.0)
MCH: 31 pg (ref 26.0–34.0)
MCHC: 34.6 g/dL (ref 30.0–36.0)
MCV: 89.5 fL (ref 78.0–100.0)
Monocytes Absolute: 1 10*3/uL (ref 0.1–1.0)
Monocytes Relative: 8 % (ref 3–12)
Neutro Abs: 8.9 10*3/uL — ABNORMAL HIGH (ref 1.7–7.7)
Neutrophils Relative %: 70 % (ref 43–77)
Platelets: 173 10*3/uL (ref 150–400)
RBC: 5.16 MIL/uL (ref 4.22–5.81)
RDW: 14.7 % (ref 11.5–15.5)
WBC: 12.7 10*3/uL — ABNORMAL HIGH (ref 4.0–10.5)

## 2013-05-29 LAB — URINALYSIS, ROUTINE W REFLEX MICROSCOPIC
Bilirubin Urine: NEGATIVE
Glucose, UA: NEGATIVE mg/dL
Hgb urine dipstick: NEGATIVE
Ketones, ur: NEGATIVE mg/dL
Leukocytes, UA: NEGATIVE
Nitrite: NEGATIVE
Protein, ur: NEGATIVE mg/dL
Specific Gravity, Urine: 1.028 (ref 1.005–1.030)
Urobilinogen, UA: 0.2 mg/dL (ref 0.0–1.0)
pH: 5 (ref 5.0–8.0)

## 2013-05-29 LAB — VALPROIC ACID LEVEL: Valproic Acid Lvl: 43.5 ug/mL — ABNORMAL LOW (ref 50.0–100.0)

## 2013-05-29 MED ORDER — SODIUM CHLORIDE 0.9 % IV BOLUS (SEPSIS)
1000.0000 mL | Freq: Once | INTRAVENOUS | Status: AC
  Administered 2013-05-29: 1000 mL via INTRAVENOUS

## 2013-05-29 NOTE — ED Provider Notes (Signed)
CSN: [id number]     Arrival date & time 05/29/13  1349 History   First MD Initiated Contact with Patient 05/29/13 1705     Chief Complaint  Patient presents with  . Dizziness   (Consider location/radiation/quality/duration/timing/severity/associated sxs/prior Treatment) HPI Patient presents to the Emergency Department complaining of dizziness and feeling like his head is foggy for 8 days since stopping his Nardil. He has a 35 year history of severe depression, OCD, and anxiety. He recently weaned himself off of the Nardil with the help of his psychiatrist at [facility] since he has felt like his "mood has been slipping" for the past few years. He was told he can't start a new depression medication until the Nardil has been out of his system for 2 weeks.He denies dizziness with positional changes. He states that nothing makes the dizziness better or worse. He reports frequent hyperventilation recently. He denies chest pain, syncope, vision changes, and SI or HI. He reports that he has an appointment with a new therapist on 11/30.    Past Medical History  Diagnosis Date  . Thyroid disease   . Hypertension   . Seizures   . Depression   . Arrhythmia   . Osteoarthritis     oa in bilateral knees  . Morbid obesity, BMI unknown   . Low back pain    Past Surgical History  Procedure Laterality Date  . Achilles tendon repair  approx. 2004    right foot  . Cataract extraction      bilateral   Family History  Problem Relation Age of Onset  . Diabetes Father   . Heart disease Father   . Diabetes Brother    History  Substance Use Topics  . Smoking status: Never Smoker   . Smokeless tobacco: Never Used  . Alcohol Use: No    Review of Systems All other systems negative except as documented in the HPI. All pertinent positives and negatives as reviewed in the HPI.  Allergies  Review of patient's allergies indicates no known allergies.  Home Medications   Current Outpatient Rx  Name   Route  Sig  Dispense  Refill  . allopurinol (ZYLOPRIM) 300 MG tablet   Oral   Take 300 mg by mouth daily.          Marland Kitchen aspirin EC 81 MG tablet   Oral   Take 81 mg by mouth 2 (two) times daily.          Marland Kitchen atenolol (TENORMIN) 25 MG tablet   Oral   Take 25 mg by mouth 2 (two) times daily.           . benztropine (COGENTIN) 2 MG tablet   Oral   Take 2 mg by mouth daily.          Marland Kitchen desonide (DESOWEN) 0.05 % cream   Topical   Apply 1 application topically 2 (two) times daily as needed (for dry skin).         Marland Kitchen diclofenac (VOLTAREN) 75 MG EC tablet   Oral   Take 75 mg by mouth 2 (two) times daily.         . divalproex (DEPAKOTE ER) 500 MG 24 hr tablet   Oral   Take 500 mg by mouth daily.          . fish oil-omega-3 fatty acids 1000 MG capsule   Oral   Take 1 g by mouth 2 (two) times daily.           Marland Kitchen  levothyroxine (SYNTHROID, LEVOTHROID) 100 MCG tablet   Oral   Take 100 mcg by mouth daily before breakfast.         . perphenazine (TRILAFON) 8 MG tablet   Oral   Take 24 mg by mouth at bedtime.           . silver sulfADIAZINE (SILVADENE) 1 % cream   Topical   Apply 1 application topically daily as needed (for rash).          BP 133/82  Pulse 80  Temp(Src) 98.5 F (36.9 C)  Resp 15  Wt 269 lb (122.018 kg)  SpO2 96% Physical Exam  Nursing note and vitals reviewed. Constitutional: He is oriented to person, place, and time. He appears well-developed and well-nourished. No distress.  HENT:  Head: Normocephalic and atraumatic.  Mouth/Throat: Oropharynx is clear and moist.  Eyes: Pupils are equal, round, and reactive to light.  Neck: Normal range of motion. Neck supple.  Cardiovascular: Normal rate, regular rhythm and normal heart sounds.  Exam reveals no gallop and no friction rub.   No murmur heard. Pulmonary/Chest: Effort normal and breath sounds normal. No respiratory distress. He has no wheezes. He has no rales.  Abdominal: Soft. Bowel sounds  are normal. He exhibits no distension. There is no tenderness. There is no rebound and no guarding.  Neurological: He is alert and oriented to person, place, and time.  Skin: Skin is warm and dry. No rash noted.  Psychiatric: He has a normal mood and affect. His behavior is normal. Judgment and thought content normal.    ED Course  Procedures (including critical care time) Labs Review Labs Reviewed  COMPREHENSIVE METABOLIC PANEL - Abnormal; Notable for the following:    Glucose, Bld 169 (*)    All other components within normal limits  CBC WITH DIFFERENTIAL - Abnormal; Notable for the following:    WBC 12.7 (*)    Neutro Abs 8.9 (*)    All other components within normal limits  VALPROIC ACID LEVEL - Abnormal; Notable for the following:    Valproic Acid Lvl 43.5 (*)    All other components within normal limits  URINALYSIS, ROUTINE W REFLEX MICROSCOPIC - Abnormal; Notable for the following:    APPearance CLOUDY (*)    All other components within normal limits   Patient is advised to followup with his psychiatrist.  Patient is in no acute distress at this time.  The patient is advised that he most likely still having some residual from the Nardil.  Patient's best return here as needed.  His been stable here in the emergency department.  Patient is feeling better following IV fluids as well   Carlyle Dolly, PA-C 05/29/13 2227

## 2013-05-29 NOTE — ED Notes (Signed)
H/a dizzy and syncopy ? W/drawal from pain meds ( nardil) pt states he just went off by weaning himself off x 3 weeks ago and then they will change his meds

## 2013-05-30 NOTE — ED Provider Notes (Signed)
Medical screening examination/treatment/procedure(s) were performed by non-physician practitioner and as supervising physician I was immediately available for consultation/collaboration.  EKG Interpretation   None        Jamerius Boeckman F Nalayah Hitt, MD 05/30/13 1042 

## 2013-09-13 ENCOUNTER — Emergency Department (HOSPITAL_COMMUNITY)
Admission: EM | Admit: 2013-09-13 | Discharge: 2013-09-14 | Disposition: A | Discharge status: Home / Self Care | Payer: Medicare Other | Attending: Emergency Medicine | Admitting: Emergency Medicine

## 2013-09-13 ENCOUNTER — Other Ambulatory Visit: Payer: Self-pay

## 2013-09-13 ENCOUNTER — Emergency Department (HOSPITAL_COMMUNITY): Payer: Medicare Other

## 2013-09-13 ENCOUNTER — Encounter (HOSPITAL_COMMUNITY): Payer: Self-pay | Admitting: Emergency Medicine

## 2013-09-13 DIAGNOSIS — T887XXA Unspecified adverse effect of drug or medicament, initial encounter: Secondary | ICD-10-CM

## 2013-09-13 DIAGNOSIS — R259 Unspecified abnormal involuntary movements: Secondary | ICD-10-CM | POA: Insufficient documentation

## 2013-09-13 DIAGNOSIS — F3289 Other specified depressive episodes: Secondary | ICD-10-CM | POA: Insufficient documentation

## 2013-09-13 DIAGNOSIS — F329 Major depressive disorder, single episode, unspecified: Secondary | ICD-10-CM | POA: Insufficient documentation

## 2013-09-13 DIAGNOSIS — Z79899 Other long term (current) drug therapy: Secondary | ICD-10-CM | POA: Insufficient documentation

## 2013-09-13 DIAGNOSIS — IMO0002 Reserved for concepts with insufficient information to code with codable children: Secondary | ICD-10-CM | POA: Insufficient documentation

## 2013-09-13 DIAGNOSIS — R42 Dizziness and giddiness: Secondary | ICD-10-CM | POA: Insufficient documentation

## 2013-09-13 DIAGNOSIS — M1710 Unilateral primary osteoarthritis, unspecified knee: Secondary | ICD-10-CM | POA: Insufficient documentation

## 2013-09-13 DIAGNOSIS — T433X5A Adverse effect of phenothiazine antipsychotics and neuroleptics, initial encounter: Secondary | ICD-10-CM | POA: Insufficient documentation

## 2013-09-13 DIAGNOSIS — G40909 Epilepsy, unspecified, not intractable, without status epilepticus: Secondary | ICD-10-CM | POA: Insufficient documentation

## 2013-09-13 DIAGNOSIS — I10 Essential (primary) hypertension: Secondary | ICD-10-CM | POA: Insufficient documentation

## 2013-09-13 DIAGNOSIS — H538 Other visual disturbances: Secondary | ICD-10-CM | POA: Insufficient documentation

## 2013-09-13 DIAGNOSIS — F32A Depression, unspecified: Secondary | ICD-10-CM

## 2013-09-13 DIAGNOSIS — F39 Unspecified mood [affective] disorder: Secondary | ICD-10-CM | POA: Insufficient documentation

## 2013-09-13 DIAGNOSIS — R251 Tremor, unspecified: Secondary | ICD-10-CM

## 2013-09-13 DIAGNOSIS — F29 Unspecified psychosis not due to a substance or known physiological condition: Secondary | ICD-10-CM | POA: Insufficient documentation

## 2013-09-13 DIAGNOSIS — Z791 Long term (current) use of non-steroidal anti-inflammatories (NSAID): Secondary | ICD-10-CM | POA: Insufficient documentation

## 2013-09-13 DIAGNOSIS — Z8659 Personal history of other mental and behavioral disorders: Secondary | ICD-10-CM | POA: Insufficient documentation

## 2013-09-13 DIAGNOSIS — Z7982 Long term (current) use of aspirin: Secondary | ICD-10-CM | POA: Insufficient documentation

## 2013-09-13 DIAGNOSIS — E079 Disorder of thyroid, unspecified: Secondary | ICD-10-CM | POA: Insufficient documentation

## 2013-09-13 LAB — COMPREHENSIVE METABOLIC PANEL
ALT: 12 U/L (ref 0–53)
AST: 19 U/L (ref 0–37)
Albumin: 3.7 g/dL (ref 3.5–5.2)
Alkaline Phosphatase: 87 U/L (ref 39–117)
BUN: 15 mg/dL (ref 6–23)
CO2: 27 mEq/L (ref 19–32)
Calcium: 9.8 mg/dL (ref 8.4–10.5)
Chloride: 98 mEq/L (ref 96–112)
Creatinine, Ser: 1.04 mg/dL (ref 0.50–1.35)
GFR calc Af Amer: >90 mL/min (ref >90)
GFR calc non Af Amer: 80 mL/min — ABNORMAL LOW (ref >90)
Glucose, Bld: 136 mg/dL — ABNORMAL HIGH (ref 70–99)
Potassium: 4.4 mEq/L (ref 3.7–5.3)
Sodium: 139 mEq/L (ref 137–147)
Total Bilirubin: 0.5 mg/dL (ref 0.3–1.2)
Total Protein: 6.9 g/dL (ref 6.0–8.3)

## 2013-09-13 LAB — CBC WITH DIFFERENTIAL/PLATELET
Basophils Absolute: 0 10*3/uL (ref 0.0–0.1)
Basophils Relative: 0 % (ref 0–1)
Eosinophils Absolute: 0.5 10*3/uL (ref 0.0–0.7)
Eosinophils Relative: 6 % — ABNORMAL HIGH (ref 0–5)
HCT: 48.7 % (ref 39.0–52.0)
Hemoglobin: 17.2 g/dL — ABNORMAL HIGH (ref 13.0–17.0)
Lymphocytes Relative: 18 % (ref 12–46)
Lymphs Abs: 1.6 10*3/uL (ref 0.7–4.0)
MCH: 32.3 pg (ref 26.0–34.0)
MCHC: 35.3 g/dL (ref 30.0–36.0)
MCV: 91.5 fL (ref 78.0–100.0)
Monocytes Absolute: 0.7 10*3/uL (ref 0.1–1.0)
Monocytes Relative: 8 % (ref 3–12)
Neutro Abs: 6 10*3/uL (ref 1.7–7.7)
Neutrophils Relative %: 69 % (ref 43–77)
Platelets: 171 10*3/uL (ref 150–400)
RBC: 5.32 MIL/uL (ref 4.22–5.81)
RDW: 14.9 % (ref 11.5–15.5)
WBC: 8.7 10*3/uL (ref 4.0–10.5)

## 2013-09-13 MED ORDER — SODIUM CHLORIDE 0.9 % IV BOLUS (SEPSIS)
500.0000 mL | Freq: Once | INTRAVENOUS | Status: AC
  Administered 2013-09-14: 500 mL via INTRAVENOUS

## 2013-09-13 NOTE — ED Notes (Signed)
Pt in c/o dizziness and increased depression over the last few weeks, some recent changes in his psychiatric medications, pt states he thinks he has been taking his medication improperly, he ran out sooner than he should of with some of his medications, pt alert and oriented at this time

## 2013-09-13 NOTE — ED Notes (Signed)
Nurse First Rounds : Nurse explained delay , process and wait time to pt. VSS/ no distress. Denies suicidal ideation .

## 2013-09-13 NOTE — ED Notes (Signed)
Patient arrives this evening with complaints of abnormal neurological symptoms. Explains that he has been treated for >30 years for depression. Medications were changed in November due to previous medication no longer being effective after 20+ years of use. Currently has increased trembling in extremities, mild confusion, intermittent aphasia, and has had recent issues with balance.

## 2013-09-13 NOTE — ED Provider Notes (Signed)
CSN: [id number]     Arrival date & time 09/13/13  1524 History   First MD Initiated Contact with Patient 09/13/13 2112     Chief Complaint  Patient presents with  . Dizziness  . Depression     (Consider location/radiation/quality/duration/timing/severity/associated sxs/prior Treatment) HPI Comments: 53 yo male with htn, testosterone deficiency, hypothyroid, seizures on depakote, obesity, schizophrenia, psychiatrist at [facility] presents with dizziness and worsening depression.  Pt has had intermittent dizziness for years, worsening the past few months.  Gait overall okay, mild balance issues that resolve on their own.  No stroke hx.  Psychiatrist feels pt is not taking his medicines properly and possible cause.  Pt on perphenazine at night.  Pt recently weaned off of risperidol over two weeks.  Worsening shaking.   Patient is a 53 y.o. male presenting with dizziness. The history is provided by the patient and a relative.  Dizziness Associated symptoms: no chest pain, no headaches, no shortness of breath and no vomiting     Past Medical History  Diagnosis Date  . Thyroid disease   . Hypertension   . Seizures   . Depression   . Arrhythmia   . Osteoarthritis     oa in bilateral knees  . Morbid obesity, BMI unknown   . Low back pain    Past Surgical History  Procedure Laterality Date  . Achilles tendon repair  approx. 2004    right foot  . Cataract extraction      bilateral   Family History  Problem Relation Age of Onset  . Diabetes Father   . Heart disease Father   . Diabetes Brother    History  Substance Use Topics  . Smoking status: Never Smoker   . Smokeless tobacco: Never Used  . Alcohol Use: No    Review of Systems  Constitutional: Negative for fever and chills.  HENT: Negative for congestion.   Eyes: Positive for visual disturbance (blurry).  Respiratory: Negative for shortness of breath.   Cardiovascular: Negative for chest pain.  Gastrointestinal: Negative  for vomiting and abdominal pain.  Genitourinary: Negative for dysuria and flank pain.  Musculoskeletal: Negative for back pain, neck pain and neck stiffness.  Skin: Negative for rash.  Neurological: Positive for dizziness, tremors, speech difficulty and light-headedness. Negative for weakness and headaches.  Psychiatric/Behavioral: Positive for confusion and dysphoric mood.      Allergies  Review of patient's allergies indicates no known allergies.  Home Medications   Current Outpatient Rx  Name  Route  Sig  Dispense  Refill  . allopurinol (ZYLOPRIM) 300 MG tablet   Oral   Take 300 mg by mouth daily.          Marland Kitchen aspirin EC 81 MG tablet   Oral   Take 81 mg by mouth 2 (two) times daily.          Marland Kitchen atenolol (TENORMIN) 25 MG tablet   Oral   Take 25 mg by mouth 2 (two) times daily.           . benztropine (COGENTIN) 2 MG tablet   Oral   Take 2 mg by mouth daily.          . Cholecalciferol (VITAMIN D) 1000 UNITS capsule   Oral   Take 1,000 Units by mouth daily.         Marland Kitchen desonide (DESOWEN) 0.05 % cream   Topical   Apply 1 application topically 2 (two) times daily as needed (for dry skin).         Marland Kitchen  diclofenac (VOLTAREN) 75 MG EC tablet   Oral   Take 75 mg by mouth 2 (two) times daily.         . divalproex (DEPAKOTE ER) 500 MG 24 hr tablet   Oral   Take 500 mg by mouth daily.          . fish oil-omega-3 fatty acids 1000 MG capsule   Oral   Take 1 g by mouth 2 (two) times daily.           Marland Kitchen levothyroxine (SYNTHROID, LEVOTHROID) 100 MCG tablet   Oral   Take 100 mcg by mouth daily before breakfast.         . perphenazine (TRILAFON) 8 MG tablet   Oral   Take 24 mg by mouth at bedtime.           . silver sulfADIAZINE (SILVADENE) 1 % cream   Topical   Apply 1 application topically daily as needed (for rash).          BP 125/75  Pulse 95  Temp(Src) 98.8 F (37.1 C) (Oral)  Resp 15  SpO2 93% Physical Exam  Nursing note and vitals  reviewed. Constitutional: He is oriented to person, place, and time. He appears well-developed and well-nourished.  HENT:  Head: Normocephalic and atraumatic.  Eyes: Conjunctivae are normal. Right eye exhibits no discharge. Left eye exhibits no discharge.  Neck: Normal range of motion. Neck supple. No tracheal deviation present.  Cardiovascular: Normal rate.  An irregularly irregular rhythm present.  Pulmonary/Chest: Effort normal and breath sounds normal.  Abdominal: Soft. He exhibits no distension. There is no tenderness. There is no guarding.  Musculoskeletal: He exhibits no edema.  Neurological: He is alert and oriented to person, place, and time. No cranial nerve deficit. GCS eye subscore is 4. GCS verbal subscore is 5. GCS motor subscore is 6.   Mild active tremor in arms bilateral. 5+ strength in UE and LE with f/e at major joints. Sensation to palpation intact in UE and LE. CNs 2-12 grossly intact.  EOMFI.  PERRL.   Finger nose and coordination intact bilateral.   Visual fields intact to finger testing. Normal speech    Skin: Skin is warm. No rash noted.    ED Course  Procedures (including critical care time) Labs Review Labs Reviewed  CBC WITH DIFFERENTIAL - Abnormal; Notable for the following:    Hemoglobin 17.2 (*)    Eosinophils Relative 6 (*)    All other components within normal limits  COMPREHENSIVE METABOLIC PANEL - Abnormal; Notable for the following:    Glucose, Bld 136 (*)    GFR calc non Af Amer 80 (*)    All other components within normal limits  URINE RAPID DRUG SCREEN (HOSP PERFORMED)  URINALYSIS, ROUTINE W REFLEX MICROSCOPIC  ETHANOL  SALICYLATE LEVEL  ACETAMINOPHEN LEVEL   Imaging Review Ct Head Wo Contrast  09/13/2013   CLINICAL DATA Dizziness.  EXAM CT HEAD WITHOUT CONTRAST  TECHNIQUE Contiguous axial images were obtained from the base of the skull through the vertex without intravenous contrast.  COMPARISON CT scan of January 31, 2007.  FINDINGS  Bony calvarium appears intact. Diffuse cortical atrophy is noted. No mass effect or midline shift is noted. Ventricular size is within normal limits. There is no evidence of mass lesion, hemorrhage or acute infarction. Old lacunar infarction is noted in right basal ganglia.  IMPRESSION Diffuse cortical atrophy.  No acute intracranial abnormality seen.  SIGNATURE  Electronically Signed   By:  Sabino Dick M.D.   On: 09/13/2013 23:34     EKG Interpretation   Date/Time:  Wednesday September 13 2013 15:58:05 EDT Ventricular Rate:  121 PR Interval:    QRS Duration: 74 QT Interval:  300 QTC Calculation: 426 R Axis:   19 Text Interpretation:  Atrial fibrillation with rapid ventricular response  Abnormal ECG Confirmed by Kinzleigh Kandler  MD, Lamara Brecht ([id number]) on 09/13/2013 10:57:13  PM      MDM   Final diagnoses:  Occasional tremors  Depression  Medication side effects  Vertigo   Multiple psychiatric/ neuro sxs. No SI or HI. No acute sxs, worsening for weeks. Non focal neuro exam.  Concern for SE from perphanazine extrapyramidal or other medicines as the cause.  Will try cogentin. Psych and neuro consult for further evaluation. HR normalized in ED.  Fluids given.   Neuro consulted, agreed likely medicine SE.  CT no acute findings.  Fup oupt with psychiatrist and neuro.  Pt did not want to wait for BH/ Psychiatric assessment to review medicines.  Results and differential diagnosis were discussed with the patient. Close follow up outpatient was discussed, patient comfortable with the plan.       Mariea Clonts, MD 09/14/13 [phone]

## 2013-09-14 DIAGNOSIS — R42 Dizziness and giddiness: Secondary | ICD-10-CM

## 2013-09-14 LAB — ACETAMINOPHEN LEVEL: Acetaminophen (Tylenol), Serum: <15 ug/mL (ref 10–30)

## 2013-09-14 LAB — SALICYLATE LEVEL: Salicylate Lvl: <2 mg/dL — ABNORMAL LOW (ref 2.8–20.0)

## 2013-09-14 LAB — ETHANOL: Alcohol, Ethyl (B): <11 mg/dL (ref 0–11)

## 2013-09-14 MED ORDER — BENZTROPINE MESYLATE 1 MG/ML IJ SOLN: 1.0000 mg | Freq: Once | INTRAMUSCULAR | Status: DC

## 2013-09-14 NOTE — Discharge Instructions (Signed)
Call your psychiatrist tomorrow to review medicines and have a recheck.  If you were given medicines take as directed.  If you are on coumadin or contraceptives realize their levels and effectiveness is altered by many different medicines.  If you have any reaction (rash, tongues swelling, other) to the medicines stop taking and see a physician.   Please follow up as directed and return to the ER or see a physician for new or worsening symptoms.  Thank you.

## 2013-09-14 NOTE — Consult Note (Signed)
NEURO HOSPITALIST CONSULT NOTE    Reason for Consult: dizziness, tremors, imbalance, memory loss.  HPI:                                                                                                                                          [name] is an 53 y.o. 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 [name] stated that he has been experiencing off and on lightheadedness/dizzines for several months, mainly when he stands up too quickly. He said that the dizziness is not associated with chest pain, SOB, palpitations, or sweatiness. At the same time, he reports tremors in both hands that have been present for quite some time, also worsening in the past few weeks. [name] tells me that he many times gets off balance and had had a couple of falls lately. Denies HA, vertigo, double vision, focal weakness or numbness, but said that sometimes his speech is slurred. In addition, he indicated that his short term memory has been declining for the last couple of years and many times he forgets what he wants to say in the middle of a conversation and has trouble retaining new information. Stated that he is feeling very depressed. Has been on several antidepressants throughout the years. CT brain performed today is unremarkable.  Past Medical History  Diagnosis Date  . Thyroid disease   . Hypertension   . Seizures   . Depression   . Arrhythmia   . Osteoarthritis     oa in bilateral knees  . Morbid obesity, BMI unknown   . Low back pain     Past Surgical History  Procedure Laterality Date  . Achilles tendon repair  approx. 2004    right foot  . Cataract extraction      bilateral    Family History  Problem Relation Age of Onset  . Diabetes Father   . Heart disease Father   .  Diabetes Brother     Social History:  reports that he has never smoked. He has never used smokeless tobacco. He reports that he does not drink alcohol or use illicit drugs.  No Known Allergies  MEDICATIONS:  I have reviewed the patient's current medications.   ROS:                                                                                                                                       History obtained from patient, mother, and chart review  General ROS: negative for - chills, fatigue, fever, night sweats, or weight loss Psychological ROS: significant for memory difficulties, mood swings Ophthalmic ROS: negative for - blurry vision, double vision, eye pain or loss of vision ENT ROS: negative for - epistaxis, nasal discharge, oral lesions, sore throat, tinnitus or vertigo Allergy and Immunology ROS: negative for - hives or itchy/watery eyes Hematological and Lymphatic ROS: negative for - bleeding problems, bruising or swollen lymph nodes Endocrine ROS: negative for - galactorrhea, hair pattern changes, polydipsia/polyuria or temperature intolerance Respiratory ROS: negative for - cough, hemoptysis, shortness of breath or wheezing Cardiovascular ROS: negative for - chest pain, dyspnea on exertion, edema or irregular heartbeat Gastrointestinal ROS: negative for - abdominal pain, diarrhea, hematemesis, nausea/vomiting or stool incontinence Genito-Urinary ROS: negative for - dysuria, hematuria, incontinence or urinary frequency/urgency Musculoskeletal ROS: negative for - joint swelling or muscular weakness Neurological ROS: as noted in HPI Dermatological ROS: negative for rash and skin lesion changes  Physical exam: pleasant male in no apparent distress. Blood pressure 125/75, pulse 95, temperature 98.8 F (37.1 C), temperature source Oral, resp. rate 15, SpO2  93.00%. Head: normocephalic. Neck: supple, no bruits, no JVD. Cardiac: no murmurs. Lungs: clear. Abdomen: soft, no tender, no mass. Extremities: no edema. CV: pulses palpable throughout  Neurologic Examination:                                                                                                      Mental Status: Alert, oriented, thought content appropriate.  Speech fluent without evidence of aphasia.  Able to follow 3 step commands without difficulty. Cranial Nerves: II: Discs flat bilaterally; Visual fields grossly normal, pupils equal, round, reactive to light and accommodation III,IV, VI: ptosis not present, extra-ocular motions intact bilaterally V,VII: smile symmetric, facial light touch sensation normal bilaterally VIII: hearing normal bilaterally IX,X: gag reflex present XI: bilateral shoulder shrug XII: midline tongue extension without atrophy or fasciculations  Motor: Right : Upper extremity   5/5    Left:     Upper extremity   5/5  Lower extremity   5/5     Lower extremity   5/5 Tone and bulk:normal tone throughout; no atrophy noted Sensory:   Pinprick and light touch intact throughout, bilaterally Deep Tendon Reflexes:  Right: Upper Extremity   Left: Upper extremity   biceps (C-5 to C-6) 2/4   biceps (C-5 to C-6) 2/4 tricep (C7) 2/4    triceps (C7) 2/4 Brachioradialis (C6) 2/4  Brachioradialis (C6) 2/4  Lower Extremity Lower Extremity  quadriceps (L-2 to L-4) 2/4   quadriceps (L-2 to L-4) 2/4 Achilles (S1) 2/4   Achilles (S1) 2/4 Plantars: Right: downgoing   Left: downgoing Coordination Mild action/postural tremor both hands, normal finger-to-nose,  normal heel-to-shin test Gait:  No frank ataxia.    Lab Results  Component Value Date/Time   CHOL 181 05/28/2011  5:55 AM    Results for orders placed during the hospital encounter of 09/13/13 (from the past 48 hour(s))  CBC WITH DIFFERENTIAL     Status: Abnormal   Collection Time    09/13/13  4:15  PM      Result Value Ref Range   WBC 8.7  4.0 - 10.5 K/uL   RBC 5.32  4.22 - 5.81 MIL/uL   Hemoglobin 17.2 (*) 13.0 - 17.0 g/dL   HCT 48.7  39.0 - 52.0 %   MCV 91.5  78.0 - 100.0 fL   MCH 32.3  26.0 - 34.0 pg   MCHC 35.3  30.0 - 36.0 g/dL   RDW 14.9  11.5 - 15.5 %   Platelets 171  150 - 400 K/uL   Neutrophils Relative % 69  43 - 77 %   Neutro Abs 6.0  1.7 - 7.7 K/uL   Lymphocytes Relative 18  12 - 46 %   Lymphs Abs 1.6  0.7 - 4.0 K/uL   Monocytes Relative 8  3 - 12 %   Monocytes Absolute 0.7  0.1 - 1.0 K/uL   Eosinophils Relative 6 (*) 0 - 5 %   Eosinophils Absolute 0.5  0.0 - 0.7 K/uL   Basophils Relative 0  0 - 1 %   Basophils Absolute 0.0  0.0 - 0.1 K/uL  COMPREHENSIVE METABOLIC PANEL     Status: Abnormal   Collection Time    09/13/13  4:15 PM      Result Value Ref Range   Sodium 139  137 - 147 mEq/L   Potassium 4.4  3.7 - 5.3 mEq/L   Chloride 98  96 - 112 mEq/L   CO2 27  19 - 32 mEq/L   Glucose, Bld 136 (*) 70 - 99 mg/dL   BUN 15  6 - 23 mg/dL   Creatinine, Ser 1.04  0.50 - 1.35 mg/dL   Calcium 9.8  8.4 - 10.5 mg/dL   Total Protein 6.9  6.0 - 8.3 g/dL   Albumin 3.7  3.5 - 5.2 g/dL   AST 19  0 - 37 U/L   ALT 12  0 - 53 U/L   Alkaline Phosphatase 87  39 - 117 U/L   Total Bilirubin 0.5  0.3 - 1.2 mg/dL   GFR calc non Af Amer 80 (*) >90 mL/min   GFR calc Af Amer >90  >90 mL/min   Comment: (NOTE)     The eGFR has been calculated using the CKD EPI equation.     This calculation has not been validated in all clinical situations.     eGFR's persistently <90 mL/min signify possible Chronic Kidney     Disease.    Dg Chest 2 View  09/14/2013   CLINICAL DATA Chest pain, dizziness  EXAM CHEST  2 VIEW    COMPARISON Prior radiograph and CT from 05/27/2011  FINDINGS The cardiac and mediastinal silhouettes are stable in size and contour, and remain within normal limits.  The lungs are normally inflated. Curvilinear opacity at the left lung base is most compatible with atelectasis  and/ scarring. No airspace consolidation, pleural effusion, or pulmonary edema is identified. There is no pneumothorax.  No acute osseous abnormality identified.  IMPRESSION 1. No acute cardiopulmonary abnormality. 2. Mild left basilar atelectasis and/ scarring.  SIGNATURE  Electronically Signed   By: Jeannine Boga M.D.   On: 09/14/2013 00:16   Ct Head Wo Contrast  09/13/2013   CLINICAL DATA Dizziness.  EXAM CT HEAD WITHOUT CONTRAST  TECHNIQUE Contiguous axial images were obtained from the base of the skull through the vertex without intravenous contrast.  COMPARISON CT scan of January 31, 2007.  FINDINGS Bony calvarium appears intact. Diffuse cortical atrophy is noted. No mass effect or midline shift is noted. Ventricular size is within normal limits. There is no evidence of mass lesion, hemorrhage or acute infarction. Old lacunar infarction is noted in right basal ganglia.  IMPRESSION Diffuse cortical atrophy.  No acute intracranial abnormality seen.  SIGNATURE  Electronically Signed   By: Sabino Dick M.D.   On: 09/13/2013 23:34   Assessment/Plan: 53 y/o with a myriad of chronic, worsening symptomatology that can not be localized to a specific segment of the neuro-axis. Neuro-exam is not particularly impressive and CT brain is unremarkable. I am afraid that patient's symptoms are most likely medication related and not indicative of structural CNS disease and thus will defer further neurological intervention at this moment. Agree with psych consultation.  Dorian Pod, MD 09/14/2013, 12:32 AM [facility].

## 2013-10-13 ENCOUNTER — Encounter: Payer: Self-pay | Admitting: Neurology

## 2013-10-13 ENCOUNTER — Ambulatory Visit (INDEPENDENT_AMBULATORY_CARE_PROVIDER_SITE_OTHER): Payer: Medicaid Other | Admitting: Neurology

## 2013-10-13 VITALS — BP 108/84 | HR 81 | Wt 260.3 lb

## 2013-10-13 DIAGNOSIS — Z87898 Personal history of other specified conditions: Secondary | ICD-10-CM

## 2013-10-13 DIAGNOSIS — R413 Other amnesia: Secondary | ICD-10-CM

## 2013-10-13 DIAGNOSIS — Z8669 Personal history of other diseases of the nervous system and sense organs: Secondary | ICD-10-CM

## 2013-10-13 DIAGNOSIS — I679 Cerebrovascular disease, unspecified: Secondary | ICD-10-CM

## 2013-10-13 NOTE — Progress Notes (Signed)
NEUROLOGY CONSULTATION NOTE  JAMARR TREINEN MRN: [id number] DOB: 07/11/1960  Referring provider: Dr. Reather Converse (ED) Primary care provider: Dr. Levell July  Reason for consult:  Memory problems.  HISTORY OF PRESENT ILLNESS: [name] is a 53 year old right-handed man with history of seizure disorder, schizophrenia, OCD, depression, hypothyroidism, testosterone deficiency, and hypertension who presents for cognitive deficits.  He is accompanied by his mother.  Records and images were personally reviewed where available.    He has longstanding history of depression and has been taking medication since age 70.  He has tried multiple different medications.  He began noticing memory problems 2 years ago, but has progressively gotten worse since Thanksgiving.  Around that time, he was taken off of Nardil because he thought it was ineffective.  He was started on Luvox and later Abilify.  This was subsequently changed to Prozac, which also is ineffective.    He reports recent worsening of depression, irritability and his OCD.  He takes perphenazine and benztropine.  He was tapered off of risperidol.  He has trouble with short term memory and constantly repeats questions and forgets conversations.  He feels overwhelmed by his OCD and easily gets flustered when he tries to think or recall, which causes increased problems focusing and more difficulty remembering.  Remote memory is intact.  He drives a car without difficulty.  He lives alone and performs all his ADLs.  His mother helps him with managing his finances.  He also has longstanding history of tremor, which he attributes to medication.  He takes Depakote ER 500mg  daily for remote history of seizures.  He has been on it since 1986, when he first developed episodes of confusion.  No convulsions.  It was proposed that he had temporal lobe epilepsy.  An EEG was reportedly normal but he was started on Depakote and he has not had a spell since.  Denies  history of alcohol or illicit drug use.  Maternal aunt had dementia.  He is followed by psychiatry at [facility].  He recently established care with a new PCP, Dr. Honor Loh.   09/26/13 LABS:  Valproic acid 33, TSH 1.969, HIV negative, hepatitis panel negative, Hgb A1c 5.9 09/13/13 CT Head:  diffuse atrophy with small vessel disease and remote lacunar infarct in right basal ganglia. 09/13/13 LABS:  ETOH undetectable, salicylate undectable, acetaminophen undetectable, WBC 3.7, Hgb 17.2, HCT 48.7, MCV 91.5, PLT 171, Na 139, K 4.4, Cl 98, glucose 136, BUN 15, Cr 1.04, Ca 9.8, Alb 3.7, TB 0.5, AP 87, AST 19, ALT 12 05/29/13 Valproic acid level: 43.5  PAST MEDICAL HISTORY: Past Medical History  Diagnosis Date  . Thyroid disease   . Hypertension   . Seizures   . Depression   . Arrhythmia   . Osteoarthritis     oa in bilateral knees  . Morbid obesity, BMI unknown   . Low back pain     PAST SURGICAL HISTORY: Past Surgical History  Procedure Laterality Date  . Achilles tendon repair  approx. 2004    right foot  . Cataract extraction      bilateral    MEDICATIONS: Current Outpatient Prescriptions on File Prior to Visit  Medication Sig Dispense Refill  . allopurinol (ZYLOPRIM) 300 MG tablet Take 300 mg by mouth daily.       Marland Kitchen aspirin EC 81 MG tablet Take 81 mg by mouth 2 (two) times daily.       Marland Kitchen atenolol (TENORMIN) 25 MG tablet Take 25  mg by mouth 2 (two) times daily.        . benztropine (COGENTIN) 2 MG tablet Take 2 mg by mouth daily.       . Cholecalciferol (VITAMIN D) 1000 UNITS capsule Take 1,000 Units by mouth daily.      Marland Kitchen desonide (DESOWEN) 0.05 % cream Apply 1 application topically 2 (two) times daily as needed (for dry skin).      Marland Kitchen diclofenac (VOLTAREN) 75 MG EC tablet Take 75 mg by mouth 2 (two) times daily.      . divalproex (DEPAKOTE ER) 500 MG 24 hr tablet Take 500 mg by mouth daily.       . fish oil-omega-3 fatty acids 1000 MG capsule Take 1 g by mouth 2 (two) times  daily.        Marland Kitchen levothyroxine (SYNTHROID, LEVOTHROID) 100 MCG tablet Take 100 mcg by mouth daily before breakfast.      . perphenazine (TRILAFON) 8 MG tablet Take 24 mg by mouth at bedtime.        . silver sulfADIAZINE (SILVADENE) 1 % cream Apply 1 application topically daily as needed (for rash).       No current facility-administered medications on file prior to visit.    ALLERGIES: No Known Allergies  FAMILY HISTORY: Family History  Problem Relation Age of Onset  . Diabetes Father   . Heart disease Father   . Diabetes Brother     SOCIAL HISTORY: History   Social History  . Marital Status: Single    Spouse Name: N/A    Number of Children: N/A  . Years of Education: N/A   Occupational History  . Not on file.   Social History Main Topics  . Smoking status: Never Smoker   . Smokeless tobacco: Never Used  . Alcohol Use: No  . Drug Use: No  . Sexual Activity: No   Other Topics Concern  . Not on file   Social History Narrative  . No narrative on file    REVIEW OF SYSTEMS: Constitutional: No fevers, chills, or sweats, no generalized fatigue, change in appetite Eyes: No visual changes, double vision, eye pain Ear, nose and throat: No hearing loss, ear pain, nasal congestion, sore throat Cardiovascular: No chest pain, palpitations Respiratory:  No shortness of breath at rest or with exertion, wheezes GastrointestinaI: No nausea, vomiting, diarrhea, abdominal pain, fecal incontinence Genitourinary:  No dysuria, urinary retention or frequency Musculoskeletal:  No neck pain, back pain Integumentary: No rash, pruritus, skin lesions Neurological: as above Psychiatric: Depression, anxiety, OCD Endocrine: No palpitations, fatigue, diaphoresis, mood swings, change in appetite, change in weight, increased thirst Hematologic/Lymphatic:  No anemia, purpura, petechiae. Allergic/Immunologic: no itchy/runny eyes, nasal congestion, recent allergic reactions, rashes  PHYSICAL  EXAM: Filed Vitals:   10/13/13 1248  BP: 108/84  Pulse: 81   General: No acute distress Head:  Normocephalic/atraumatic Neck: supple, no paraspinal tenderness, full range of motion Back: No paraspinal tenderness Heart: regular rate and rhythm Lungs: Clear to auscultation bilaterally. Vascular: No carotid bruits. Neurological Exam: Mental status: alert and oriented to person, place, and time, remote memory intact, fund of knowledge intact, attention intact but concentration somewhat impaired.  Able to complete Trail Making Test and a clock.  Had difficulty copying a cube.  He can name, repeat, read and follow complex commands.  Naming fluency was poor.  Serial 7 subtraction intact.  Abstraction intact.  Delayed recall poor (unable to recall 5 of 5 words).  Sedona 23/30.  Speech fluent and not dysarthric, language intact. Cranial nerves: CN I: not tested CN II: pupils equal, round and reactive to light, visual fields intact, fundi unremarkable, without vessel changes, exudates, hemorrhages or papilledema. CN III, IV, VI:  full range of motion, no nystagmus, no ptosis CN V: facial sensation intact CN VII: upper and lower face symmetric CN VIII: hearing intact CN IX, X: gag intact, uvula midline CN XI: sternocleidomastoid and trapezius muscles intact CN XII: tongue midline Bulk & Tone: normal, no fasciculations. Motor: 5/5 throughout Sensation: temperature and vibration intact. Deep Tendon Reflexes: 2+ throughout except absent in the ankles.  Toes down. Finger to nose testing: Very fine postural and kinetic tremor in the hands.  No dysmetria Heel to shin: no dysmetria Gait: normal station and stride.  Able to turn and walk in tandem. Romberg negative.  IMPRESSION: 1.  Memory deficits.  I suspect it is related to difficulty focusing due to underlying OCD and anxiety rather than organic cognitive impairment. 2.  Mild tremor.  May be medication related.  I don't suspect it is due to  Depakote as he is on such a low dose. 3.  History of seizures, non-epileptic vs epileptic, controlled 4.  Cerebrovascular disease  PLAN: 1.  Check B12 2.  May consider neuropsych testing if needed. 3.  ASA 81mg  daily for secondary stroke prevention 4.  May want to check fasting lipid panel (LDL goal should be less than 100). 5.  Follow up as needed.  Thank you for allowing me to take part in the care of this patient.  Metta Clines, DO  CC:  Honor Loh

## 2013-10-13 NOTE — Patient Instructions (Signed)
I think the memory problems are likely related to the OCD.  We can refer you for formal neuropsych testing to evaluate further if needed.  In the meantime, we will check vitamin B12 level.  Continue aspirin 81mg  daily.  Will ask Dr. Levell July to check cholesterol level.  Follow up as needed.

## 2013-10-14 LAB — VITAMIN B12: Vitamin B-12: 492 pg/mL (ref 211–911)

## 2013-10-19 LAB — METHYLMALONIC ACID, SERUM: Methylmalonic Acid, Quant: 189 nmol/L (ref 87–318)

## 2013-10-20 ENCOUNTER — Telehealth: Payer: Self-pay | Admitting: Neurology

## 2013-10-20 NOTE — Telephone Encounter (Signed)
Left message on machine for patient to call back.

## 2013-10-20 NOTE — Telephone Encounter (Signed)
Patient made aware labs normal.  

## 2013-10-20 NOTE — Telephone Encounter (Signed)
Message copied by Annamaria Helling on Fri Oct 20, 2013  8:18 AM ------      Message from: JAFFE, ADAM R      Created: Fri Oct 20, 2013  5:48 AM       Vitamin B12 level is okay.      ----- Message -----         From: Lab in [facility] Interface         Sent: 10/19/2013   8:10 AM           To: Dudley Major, DO                   ------

## 2013-11-19 ENCOUNTER — Emergency Department (HOSPITAL_COMMUNITY)
Admission: EM | Admit: 2013-11-19 | Discharge: 2013-11-19 | Disposition: A | Discharge status: Home / Self Care | Payer: Medicare Other | Attending: Emergency Medicine | Admitting: Emergency Medicine

## 2013-11-19 ENCOUNTER — Encounter (HOSPITAL_COMMUNITY): Payer: Self-pay | Admitting: Emergency Medicine

## 2013-11-19 DIAGNOSIS — F329 Major depressive disorder, single episode, unspecified: Secondary | ICD-10-CM | POA: Diagnosis not present

## 2013-11-19 DIAGNOSIS — E079 Disorder of thyroid, unspecified: Secondary | ICD-10-CM | POA: Insufficient documentation

## 2013-11-19 DIAGNOSIS — G40909 Epilepsy, unspecified, not intractable, without status epilepticus: Secondary | ICD-10-CM | POA: Diagnosis not present

## 2013-11-19 DIAGNOSIS — Z79899 Other long term (current) drug therapy: Secondary | ICD-10-CM | POA: Insufficient documentation

## 2013-11-19 DIAGNOSIS — IMO0002 Reserved for concepts with insufficient information to code with codable children: Secondary | ICD-10-CM | POA: Insufficient documentation

## 2013-11-19 DIAGNOSIS — F1310 Sedative, hypnotic or anxiolytic abuse, uncomplicated: Secondary | ICD-10-CM | POA: Diagnosis not present

## 2013-11-19 DIAGNOSIS — Z7982 Long term (current) use of aspirin: Secondary | ICD-10-CM | POA: Diagnosis not present

## 2013-11-19 DIAGNOSIS — M1710 Unilateral primary osteoarthritis, unspecified knee: Secondary | ICD-10-CM | POA: Diagnosis not present

## 2013-11-19 DIAGNOSIS — F32A Depression, unspecified: Secondary | ICD-10-CM

## 2013-11-19 DIAGNOSIS — F3289 Other specified depressive episodes: Secondary | ICD-10-CM | POA: Insufficient documentation

## 2013-11-19 DIAGNOSIS — Z008 Encounter for other general examination: Secondary | ICD-10-CM | POA: Diagnosis present

## 2013-11-19 DIAGNOSIS — I10 Essential (primary) hypertension: Secondary | ICD-10-CM | POA: Insufficient documentation

## 2013-11-19 DIAGNOSIS — F332 Major depressive disorder, recurrent severe without psychotic features: Secondary | ICD-10-CM

## 2013-11-19 LAB — COMPREHENSIVE METABOLIC PANEL
ALT: 8 U/L (ref 0–53)
AST: 15 U/L (ref 0–37)
Albumin: 3.9 g/dL (ref 3.5–5.2)
Alkaline Phosphatase: 117 U/L (ref 39–117)
BUN: 17 mg/dL (ref 6–23)
CO2: 24 mEq/L (ref 19–32)
Calcium: 9.7 mg/dL (ref 8.4–10.5)
Chloride: 98 mEq/L (ref 96–112)
Creatinine, Ser: 1.03 mg/dL (ref 0.50–1.35)
GFR calc Af Amer: >90 mL/min (ref >90)
GFR calc non Af Amer: 80 mL/min — ABNORMAL LOW (ref >90)
Glucose, Bld: 126 mg/dL — ABNORMAL HIGH (ref 70–99)
Potassium: 4 mEq/L (ref 3.7–5.3)
Sodium: 139 mEq/L (ref 137–147)
Total Bilirubin: 0.7 mg/dL (ref 0.3–1.2)
Total Protein: 7.2 g/dL (ref 6.0–8.3)

## 2013-11-19 LAB — CBC
HCT: 48.5 % (ref 39.0–52.0)
Hemoglobin: 16.8 g/dL (ref 13.0–17.0)
MCH: 30.4 pg (ref 26.0–34.0)
MCHC: 34.6 g/dL (ref 30.0–36.0)
MCV: 87.9 fL (ref 78.0–100.0)
Platelets: 209 10*3/uL (ref 150–400)
RBC: 5.52 MIL/uL (ref 4.22–5.81)
RDW: 13.8 % (ref 11.5–15.5)
WBC: 11.1 10*3/uL — ABNORMAL HIGH (ref 4.0–10.5)

## 2013-11-19 LAB — RAPID URINE DRUG SCREEN, HOSP PERFORMED
Amphetamines: NOT DETECTED
Barbiturates: NOT DETECTED
Benzodiazepines: POSITIVE — AB
Cocaine: NOT DETECTED
Opiates: NOT DETECTED
Tetrahydrocannabinol: NOT DETECTED

## 2013-11-19 LAB — ETHANOL: Alcohol, Ethyl (B): <11 mg/dL (ref 0–11)

## 2013-11-19 LAB — SALICYLATE LEVEL: Salicylate Lvl: <2 mg/dL — ABNORMAL LOW (ref 2.8–20.0)

## 2013-11-19 LAB — ACETAMINOPHEN LEVEL: Acetaminophen (Tylenol), Serum: <15 ug/mL (ref 10–30)

## 2013-11-19 NOTE — ED Notes (Addendum)
Mother, Hinton Dyer, spoke with patient and RN. Wants MD to contact Pt Physician.  Dr Juanna Cao (cell) [phone]. Mother  [phone].  Notified MD of numbers

## 2013-11-19 NOTE — Consult Note (Signed)
[facility] Face-to-Face Psychiatry Consult   Reason for Consult:  Suicidal thoughts, depression Referring Physician:  EDP [name] is an 53 y.o. male. Total Time spent with patient: 45 minutes  Assessment: AXIS I:  Major Depression, Recurrent severe AXIS II:  Deferred AXIS III:   Past Medical History  Diagnosis Date  . Thyroid disease   . Hypertension   . Seizures   . Depression   . Arrhythmia   . Osteoarthritis     oa in bilateral knees  . Morbid obesity, BMI unknown   . Low back pain    AXIS IV:  other psychosocial or environmental problems and problems related to social environment AXIS V:  51-60 moderate symptoms  Plan:  No evidence of imminent risk to self or others at present.   Discussed crisis plan, support from social network, calling 911, coming to the Emergency Department, and calling Suicide Hotline. Encouraged to see his Psychiatrist in am  Subjective:   [name] is a 53 y.o. male patient evaluated for Recurrent Major Depressive disorder, suicidal ideation.  HPI:   Caucasian male who came for evaluation of suicidal thought with no plan.   Patient has long hx of depression and he has been on Nardil for a long time.  This medication was discontinued at a time because patient felt it was not working for him.  He was prescribed Prozac which he says did not work either.  He was put back on his Nardil by his Psychiatrist and was asked to monitor himself.  Patient states he came to the ER because he was asked by his Psychiatrist to go to the ER if he felt more depressed.  Patient felt more depressed and came to the ER.  He says he wanted to be seen as fast as possible he then told the ER staff he was suicidal.  Today on rounds, patient denies feeling suicidal and reported that his depression is at the level it has been.  Patient  States he is not homicidal, denies auditory, visual hallucination and he denies delusion.  Patient states he can walk in to his Psychiatrist  tomorrow.  Patient is alert and oriented x3.  We will discharge him home to follow up with his Psychiatrist HPI Elements:   Location:  Depression. Quality:  moderate. Severity:  moderate. Context:  feeling more depressed after medication change by his Psychiatrist.  Past Psychiatric History: Past Medical History  Diagnosis Date  . Thyroid disease   . Hypertension   . Seizures   . Depression   . Arrhythmia   . Osteoarthritis     oa in bilateral knees  . Morbid obesity, BMI unknown   . Low back pain     reports that he has never smoked. He has never used smokeless tobacco. He reports that he does not drink alcohol or use illicit drugs. Family History  Problem Relation Age of Onset  . Diabetes Father   . Heart disease Father   . Diabetes Brother            Allergies:  No Known Allergies  ACT Assessment Complete:  No:   Past Psychiatric History: Diagnosis:  Major depressive d/o, recurrent  Hospitalizations:  no  Outpatient Care:  yes  Substance Abuse Care:  denies  Self-Mutilation:  denies  Suicidal Attempts:  denies  Homicidal Behaviors: denies   Violent Behaviors:  denies   Place of Residence:  [place] Marital Status:  unknown Employed/Unemployed:  unknown Education:  unknown  Family Supports:  yes Objective: Blood pressure 127/85, pulse 78, temperature 99 F (37.2 C), temperature source Oral, resp. rate 19, height $RemoveBe'5\' 10"'NGCFXmiTD$  (1.778 m), weight 124.739 kg (275 lb), SpO2 99.00%.Body mass index is 39.46 kg/(m^2). Results for orders placed during the hospital encounter of 11/19/13 (from the past 72 hour(s))  ACETAMINOPHEN LEVEL     Status: None   Collection Time    11/19/13  5:50 AM      Result Value Ref Range   Acetaminophen (Tylenol), Serum <15.0  10 - 30 ug/mL   Comment:            THERAPEUTIC CONCENTRATIONS VARY     SIGNIFICANTLY. A RANGE OF 10-30     ug/mL MAY BE AN EFFECTIVE     CONCENTRATION FOR MANY PATIENTS.     HOWEVER, SOME ARE BEST TREATED     AT  CONCENTRATIONS OUTSIDE THIS     RANGE.     ACETAMINOPHEN CONCENTRATIONS     >150 ug/mL AT 4 HOURS AFTER     INGESTION AND >50 ug/mL AT 12     HOURS AFTER INGESTION ARE     OFTEN ASSOCIATED WITH TOXIC     REACTIONS.  CBC     Status: Abnormal   Collection Time    11/19/13  5:50 AM      Result Value Ref Range   WBC 11.1 (*) 4.0 - 10.5 K/uL   RBC 5.52  4.22 - 5.81 MIL/uL   Hemoglobin 16.8  13.0 - 17.0 g/dL   HCT 48.5  39.0 - 52.0 %   MCV 87.9  78.0 - 100.0 fL   MCH 30.4  26.0 - 34.0 pg   MCHC 34.6  30.0 - 36.0 g/dL   RDW 13.8  11.5 - 15.5 %   Platelets 209  150 - 400 K/uL  COMPREHENSIVE METABOLIC PANEL     Status: Abnormal   Collection Time    11/19/13  5:50 AM      Result Value Ref Range   Sodium 139  137 - 147 mEq/L   Potassium 4.0  3.7 - 5.3 mEq/L   Chloride 98  96 - 112 mEq/L   CO2 24  19 - 32 mEq/L   Glucose, Bld 126 (*) 70 - 99 mg/dL   BUN 17  6 - 23 mg/dL   Creatinine, Ser 1.03  0.50 - 1.35 mg/dL   Calcium 9.7  8.4 - 10.5 mg/dL   Total Protein 7.2  6.0 - 8.3 g/dL   Albumin 3.9  3.5 - 5.2 g/dL   AST 15  0 - 37 U/L   ALT 8  0 - 53 U/L   Alkaline Phosphatase 117  39 - 117 U/L   Total Bilirubin 0.7  0.3 - 1.2 mg/dL   GFR calc non Af Amer 80 (*) >90 mL/min   GFR calc Af Amer >90  >90 mL/min   Comment: (NOTE)     The eGFR has been calculated using the CKD EPI equation.     This calculation has not been validated in all clinical situations.     eGFR's persistently <90 mL/min signify possible Chronic Kidney     Disease.  ETHANOL     Status: None   Collection Time    11/19/13  5:50 AM      Result Value Ref Range   Alcohol, Ethyl (B) <11  0 - 11 mg/dL   Comment:            LOWEST  DETECTABLE LIMIT FOR     SERUM ALCOHOL IS 11 mg/dL     FOR MEDICAL PURPOSES ONLY  SALICYLATE LEVEL     Status: Abnormal   Collection Time    11/19/13  5:50 AM      Result Value Ref Range   Salicylate Lvl <6.3 (*) 2.8 - 20.0 mg/dL  URINE RAPID DRUG SCREEN (HOSP PERFORMED)     Status:  Abnormal   Collection Time    11/19/13 10:14 AM      Result Value Ref Range   Opiates NONE DETECTED  NONE DETECTED   Cocaine NONE DETECTED  NONE DETECTED   Benzodiazepines POSITIVE (*) NONE DETECTED   Amphetamines NONE DETECTED  NONE DETECTED   Tetrahydrocannabinol NONE DETECTED  NONE DETECTED   Barbiturates NONE DETECTED  NONE DETECTED   Comment:            DRUG SCREEN FOR MEDICAL PURPOSES     ONLY.  IF CONFIRMATION IS NEEDED     FOR ANY PURPOSE, NOTIFY LAB     WITHIN 5 DAYS.                LOWEST DETECTABLE LIMITS     FOR URINE DRUG SCREEN     Drug Class       Cutoff (ng/mL)     Amphetamine      1000     Barbiturate      200     Benzodiazepine   016     Tricyclics       010     Opiates          300     Cocaine          300     THC              50   Labs are reviewed and are pertinent for UDS positive for   Benzodiazepine  No current facility-administered medications for this encounter.   Current Outpatient Prescriptions  Medication Sig Dispense Refill  . allopurinol (ZYLOPRIM) 300 MG tablet Take 300 mg by mouth daily.       Marland Kitchen ALPRAZolam (XANAX) 0.25 MG tablet Take 0.25 mg by mouth 3 (three) times daily as needed for anxiety.      Marland Kitchen aspirin EC 81 MG tablet Take 81 mg by mouth 2 (two) times daily.       Marland Kitchen atenolol (TENORMIN) 25 MG tablet Take 25 mg by mouth 2 (two) times daily.        . benztropine (COGENTIN) 2 MG tablet Take 2 mg by mouth daily.       . Cholecalciferol (VITAMIN D) 1000 UNITS capsule Take 1,000 Units by mouth daily.      Marland Kitchen desonide (DESOWEN) 0.05 % cream Apply 1 application topically 2 (two) times daily as needed (for dry skin).      Marland Kitchen divalproex (DEPAKOTE ER) 500 MG 24 hr tablet Take 500 mg by mouth daily.       . fish oil-omega-3 fatty acids 1000 MG capsule Take 1 g by mouth daily.       Marland Kitchen levothyroxine (SYNTHROID, LEVOTHROID) 100 MCG tablet Take 100 mcg by mouth daily before breakfast.      . omeprazole (PRILOSEC) 20 MG capsule Take 20 mg by mouth daily.       Marland Kitchen perphenazine (TRILAFON) 8 MG tablet Take 24 mg by mouth at bedtime.        Derrill Memo ON 11/27/2013] phenelzine (NARDIL) 15  MG tablet Take 15 mg by mouth 3 (three) times daily.       . silver sulfADIAZINE (SILVADENE) 1 % cream Apply 1 application topically daily as needed (for rash).       Review of Physical examination by EDP 11/19/2013 [facility] Exam:     Blood pressure 127/85, pulse 78, temperature 99 F (37.2 C), temperature source Oral, resp. rate 19, height $RemoveBe'5\' 10"'ctUKrkBmb$  (1.778 m), weight 124.739 kg (275 lb), SpO2 99.00%.Body mass index is 39.46 kg/(m^2).  General Appearance: Casual  Eye Contact::  Good  Speech:  Clear and Coherent and Normal Rate  Volume:  Normal  Mood:  Depressed  Affect:  Depressed  Thought Process:  Coherent and Goal Directed  Orientation:  Full (Time, Place, and Person)  Thought Content:  WDL  Suicidal Thoughts:  No  Homicidal Thoughts:  No  Memory:  Immediate;   Good Recent;   Good Remote;   Good  Judgement:  Poor  Insight:  Good  Psychomotor Activity:  Normal  Concentration:  Good  Recall:  NA  Fund of Knowledge:Good  Language: Good  Akathisia:  NA  Handed:  Right  AIMS (if indicated):     Assets:  Desire for Improvement  Sleep:      Musculoskeletal: Strength & Muscle Tone: within normal limits Gait & Station: normal Patient leans: N/A  Treatment Plan Summary: [place] home to follow his outpatient Psychiatrist  Delfin Gant PMHNP-BC 11/19/2013 1:57 PM  Patient was seen face to face with physician extender and formulated treatment plan. Reviewed the information documented and agree with the treatment plan.  Parke Simmers Letcher Schweikert 11/19/2013 4:39 PM

## 2013-11-19 NOTE — ED Notes (Addendum)
Pt stated after being triaged that he has his car parked in the emergency entrance. Writer stated that security could move his car to a parking spot for him. Pt stated " I have other keys on there that I don't want them to touch." Pt was advised that we could try to remove his car key from the ring and bring that key back once the car was moved by security. Pt again stated that he would not let anyone else move his car. Pt was educated that he could not move his own car at this time and security and the off duty [employer] officer was called to bedside. At that time, pt became verbally aggressive, yelling stating "I need to leave. No one knows that I am here. I need to call my mom. Last time I was here I was here for 12 hours and I can't be here all day. I'm leaving." Writer again informed pt that d/t his complaint of suicidal ideation he was not able to leave the ED at this time. Pt continued to escalate, CN and EDP came to bedside as pt continued to state he was going to leave and stated "I never said I was suicidal." EDP spoke to pt and pt agrees to stay voluntarily at this time.

## 2013-11-19 NOTE — ED Provider Notes (Signed)
CSN: [id number]     Arrival date & time 11/19/13  0456 History   First MD Initiated Contact with Patient 11/19/13 0543     Chief Complaint  Patient presents with  . Medical Clearance     (Consider location/radiation/quality/duration/timing/severity/associated sxs/prior Treatment) HPI  This 53 year old male with history of depression who presents increasing depressive symptoms. I was called emergently to the bedside with concerns that the patient was suicidal. Patient was very agitated with the staff. Upon the descalation, patient told me that he has been seeing a psychiatrist in [facility]. He has been on Nardil for many years and has been taken off that because "it's not working anymore." He is supposed to be placed on another medication but states that his psychiatrist is making him wait longer than 2 weeks he was originally told. He cannot be seen by a psychiatrist until Tuesday. Patient states that he feels more depressed and OCD. He states that he is eating and sleeping okay but just feels "more depressed." He denies suicidal ideation to me. However, he did endorse suicidal ideation in triage without a plan. Patient states that he said this "so I would be seen quicker." Patient denies any homicidal ideation, hallucinations, or delusions. He is cooperative on my examination. He is requesting medication adjustment.  Past Medical History  Diagnosis Date  . Thyroid disease   . Hypertension   . Seizures   . Depression   . Arrhythmia   . Osteoarthritis     oa in bilateral knees  . Morbid obesity, BMI unknown   . Low back pain    Past Surgical History  Procedure Laterality Date  . Achilles tendon repair  approx. 2004    right foot  . Cataract extraction      bilateral   Family History  Problem Relation Age of Onset  . Diabetes Father   . Heart disease Father   . Diabetes Brother    History  Substance Use Topics  . Smoking status: Never Smoker   . Smokeless tobacco: Never  Used  . Alcohol Use: No    Review of Systems  Constitutional: Negative.  Negative for fever.  Respiratory: Negative.  Negative for chest tightness and shortness of breath.   Cardiovascular: Negative.  Negative for chest pain.  Gastrointestinal: Negative.  Negative for abdominal pain.  Genitourinary: Negative.  Negative for dysuria.  Neurological: Negative for weakness and headaches.  Psychiatric/Behavioral: Negative for suicidal ideas and self-injury.       Depression  All other systems reviewed and are negative.     Allergies  Review of patient's allergies indicates no known allergies.  Home Medications   Prior to Admission medications   Medication Sig Start Date End Date Taking? Authorizing Provider  allopurinol (ZYLOPRIM) 300 MG tablet Take 300 mg by mouth daily.    Yes Historical Provider, MD  aspirin EC 81 MG tablet Take 81 mg by mouth 2 (two) times daily.    Yes Historical Provider, MD  atenolol (TENORMIN) 25 MG tablet Take 25 mg by mouth 2 (two) times daily.     Yes Historical Provider, MD  benztropine (COGENTIN) 2 MG tablet Take 2 mg by mouth daily.    Yes Historical Provider, MD  fish oil-omega-3 fatty acids 1000 MG capsule Take 1 g by mouth 2 (two) times daily.     Yes Historical Provider, MD  levothyroxine (SYNTHROID, LEVOTHROID) 100 MCG tablet Take 100 mcg by mouth daily before breakfast.   Yes Historical Provider, MD  perphenazine (TRILAFON) 8 MG tablet Take 24 mg by mouth at bedtime.     Yes Historical Provider, MD  Cholecalciferol (VITAMIN D) 1000 UNITS capsule Take 1,000 Units by mouth daily.    Historical Provider, MD  desonide (DESOWEN) 0.05 % cream Apply 1 application topically 2 (two) times daily as needed (for dry skin).    Historical Provider, MD  divalproex (DEPAKOTE ER) 500 MG 24 hr tablet Take 500 mg by mouth daily.     Historical Provider, MD  phenelzine (NARDIL) 15 MG tablet Take 15 mg by mouth 4 (four) times daily. 11/27/13   Historical Provider, MD   silver sulfADIAZINE (SILVADENE) 1 % cream Apply 1 application topically daily as needed (for rash).    Historical Provider, MD   BP 121/85  Pulse 75  Temp(Src) 99 F (37.2 C) (Oral)  Resp 18  Ht 5\' 10"  (1.778 m)  Wt 275 lb (124.739 kg)  BMI 39.46 kg/m2  SpO2 95% Physical Exam  Nursing note and vitals reviewed. Constitutional: He is oriented to person, place, and time. He appears well-developed and well-nourished.  Obese  HENT:  Head: Normocephalic and atraumatic.  Eyes: Pupils are equal, round, and reactive to light.  Cardiovascular: Normal rate and regular rhythm.   Pulmonary/Chest: Effort normal. No respiratory distress.  Abdominal: Soft. There is no tenderness.  Musculoskeletal: He exhibits no edema.  Lymphadenopathy:    He has no cervical adenopathy.  Neurological: He is alert and oriented to person, place, and time.  Skin: Skin is warm and dry.  Psychiatric: He has a normal mood and affect.  Flat Affect, directable, denies HI and SI    ED Course  Procedures (including critical care time) Labs Review Labs Reviewed  CBC - Abnormal; Notable for the following:    WBC 11.1 (*)    All other components within normal limits  COMPREHENSIVE METABOLIC PANEL - Abnormal; Notable for the following:    Glucose, Bld 126 (*)    GFR calc non Af Amer 80 (*)    All other components within normal limits  SALICYLATE LEVEL - Abnormal; Notable for the following:    Salicylate Lvl <8.2 (*)    All other components within normal limits  ACETAMINOPHEN LEVEL  ETHANOL  URINE RAPID DRUG SCREEN (HOSP PERFORMED)    Imaging Review No results found.   EKG Interpretation None      MDM   Final diagnoses:  None    Patient presents with increasing depression symptoms. He is having medication adjustments as an outpatient but states that his depressive symptoms are getting worse and he is unable to followup with a psychiatrist until Tuesday. He endorsed suicidal ideation to the triage  nurse without a plan; however, he denies this to me. He is awake, alert, and appropriate for interviewing. Will place TTS consult for evaluation. At this time patient is voluntary.    Merryl Hacker, MD 11/19/13 [phone]

## 2013-11-19 NOTE — [facility] Suicide Risk Assessment (Cosign Needed)
Suicide Risk Assessment  Discharge Assessment     Demographic Factors:  Male, Caucasian and Living alone  Total Time spent with patient: 45 minutes  Psychiatric Specialty Exam:     Blood pressure 127/85, pulse 78, temperature 99 F (37.2 C), temperature source Oral, resp. rate 19, height 5\' 10"  (1.778 m), weight 124.739 kg (275 lb), SpO2 99.00%.Body mass index is 39.46 kg/(m^2).  General Appearance: Casual  Eye Contact::  Good  Speech:  Clear and Coherent and Normal Rate  Volume:  Normal  Mood:  Angry, Irritable and Patient is angry he came to the ER.  PLANS TO SEE HIS PSYCHIATRIST TOMORROW  Affect:  Congruent  Thought Process:  Coherent  Orientation:  Full (Time, Place, and Person)  Thought Content:  WDL  Suicidal Thoughts:  No  Homicidal Thoughts:  No  Memory:  Immediate;   Good Recent;   Good Remote;   Good  Judgement:  Good  Insight:  Good  Psychomotor Activity:  Normal  Concentration:  Good  Recall:  NA  Fund of Knowledge:Good  Language: Good  Akathisia:  NA  Handed:  Right  AIMS (if indicated):     Assets:  Desire for Improvement  Sleep:       Musculoskeletal: Strength & Muscle Tone: within normal limits Gait & Station: normal Patient leans: N/A   Mental Status Per Nursing Assessment::   On Admission:     Current Mental Status by Physician: NA  Loss Factors: NA  Historical Factors: NA  Risk Reduction Factors:   Positive therapeutic relationship and Positive coping skills or problem solving skills  Continued Clinical Symptoms:  Depression:   Recent sense of peace/wellbeing  Cognitive Features That Contribute To Risk:  Polarized thinking    Suicide Risk:  Minimal: No identifiable suicidal ideation.  Patients presenting with no risk factors but with morbid ruminations; may be classified as minimal risk based on the severity of the depressive symptoms  Discharge Diagnoses:   AXIS I:  Major Depression, Recurrent severe AXIS II:   Deferred AXIS III:   Past Medical History  Diagnosis Date  . Thyroid disease   . Hypertension   . Seizures   . Depression   . Arrhythmia   . Osteoarthritis     oa in bilateral knees  . Morbid obesity, BMI unknown   . Low back pain    AXIS IV:  other psychosocial or environmental problems and problems related to social environment AXIS V:  51-60 moderate symptoms  Plan Of Care/Follow-up recommendations:  Activity:  AS tolerated Diet:  regular  Is patient on multiple antipsychotic therapies at discharge:  No   Has Patient had three or more failed trials of antipsychotic monotherapy by history:  No  Recommended Plan for Multiple Antipsychotic Therapies: NA    Delfin Gant   PMHNP-BC 11/19/2013, 1:39 PM

## 2013-11-19 NOTE — ED Notes (Addendum)
Pt reports that he was taken off of Nardil and placed on Prozac for increased SI, but wants to be put back on Nardil and has been waiting x2 weeks to be placed on this medication, and now has been told he needs to wait another 3 weeks. Pt reports that he is suicidal, no plan, hx OCD. Denies HI AVH or ETOH/ drug use. Pt a&o x4, calm and cooperative at this time.

## 2013-11-19 NOTE — ED Notes (Signed)
Pt belongings (1 bag) in locker 27, pt wanded and searched

## 2013-11-19 NOTE — ED Notes (Signed)
Pt walking around the dept with sitter at this time. Pt questioning why it is taking so long. Explained to pt that he needs to talk to psychiatry. Pt requesting a xanax and was advised that one has not been ordered. Pt also requesting to "turn beeping off because it's driving me nuts." Further explained to pt that beeping is necessary to monitor pts condition and cannot be turned off. Pt now returned to hall bed with sitter and lying down. Pt is A&O and in NAD

## 2013-11-19 NOTE — Progress Notes (Signed)
Per, Reginold Agent and Dr.J the patient is psychiatrically stable and will be discharged. Patient will follow up with his own Psychiatrist.

## 2013-11-19 NOTE — BH Assessment (Signed)
Attempted to assess pt but pt was speaking with someone about his medications and experiences at the hospital.

## 2013-11-19 NOTE — ED Notes (Signed)
Bed: [facility] Expected date:  Expected time:  Means of arrival:  Comments: 

## 2013-11-19 NOTE — ED Notes (Signed)
Pt became very upset, yelling at nurses at nurses station.  Pt yelling about not getting the medicines he wants and wanting to leave.  Pt reminded that he was welcome to leave if he was unhappy with the care he was receiving.  Pt was given his belongings and escorted off the property.

## 2013-11-19 NOTE — ED Notes (Signed)
Per TTS- pt will be going home as soon as he can put orders in for d/c

## 2013-11-24 ENCOUNTER — Ambulatory Visit (HOSPITAL_COMMUNITY)
Admission: RE | Admit: 2013-11-24 | Discharge: 2013-11-24 | Disposition: A | Discharge status: Home / Self Care | Payer: Medicare Other | Attending: Psychiatry | Admitting: Psychiatry
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
## 2013-12-01 ENCOUNTER — Telehealth: Payer: Self-pay | Admitting: Neurology

## 2013-12-01 NOTE — Telephone Encounter (Signed)
Pt mother needs to talk to someone about a test please call [phone]

## 2013-12-01 NOTE — Telephone Encounter (Signed)
Returned call to the patients mother at the number given  No answer

## 2013-12-04 NOTE — Telephone Encounter (Signed)
Pt mother is returning your call please call [phone]

## 2013-12-04 NOTE — Telephone Encounter (Signed)
Patient's mother wanting to know where he would be referred for neuropsych testing. I made her aware of Dr Valentina Shaggy, Dr Conley Canal and Dr Thayer Ohm. She will let us know if they would like [place] to refer them.

## 2014-03-25 ENCOUNTER — Encounter (HOSPITAL_COMMUNITY): Payer: Self-pay | Admitting: Emergency Medicine

## 2014-03-25 ENCOUNTER — Emergency Department (HOSPITAL_COMMUNITY)
Admission: EM | Admit: 2014-03-25 | Discharge: 2014-03-25 | Disposition: A | Discharge status: Home / Self Care | Payer: Medicare Other | Attending: Emergency Medicine | Admitting: Emergency Medicine

## 2014-03-25 DIAGNOSIS — M1990 Unspecified osteoarthritis, unspecified site: Secondary | ICD-10-CM | POA: Diagnosis not present

## 2014-03-25 DIAGNOSIS — I10 Essential (primary) hypertension: Secondary | ICD-10-CM | POA: Insufficient documentation

## 2014-03-25 DIAGNOSIS — Z79899 Other long term (current) drug therapy: Secondary | ICD-10-CM | POA: Diagnosis not present

## 2014-03-25 DIAGNOSIS — G40909 Epilepsy, unspecified, not intractable, without status epilepticus: Secondary | ICD-10-CM | POA: Diagnosis not present

## 2014-03-25 DIAGNOSIS — R531 Weakness: Secondary | ICD-10-CM

## 2014-03-25 DIAGNOSIS — E869 Volume depletion, unspecified: Secondary | ICD-10-CM | POA: Diagnosis not present

## 2014-03-25 DIAGNOSIS — Z7982 Long term (current) use of aspirin: Secondary | ICD-10-CM | POA: Diagnosis not present

## 2014-03-25 DIAGNOSIS — R5381 Other malaise: Secondary | ICD-10-CM | POA: Insufficient documentation

## 2014-03-25 DIAGNOSIS — F3289 Other specified depressive episodes: Secondary | ICD-10-CM | POA: Diagnosis not present

## 2014-03-25 DIAGNOSIS — F329 Major depressive disorder, single episode, unspecified: Secondary | ICD-10-CM | POA: Insufficient documentation

## 2014-03-25 DIAGNOSIS — R5383 Other fatigue: Principal | ICD-10-CM

## 2014-03-25 DIAGNOSIS — E079 Disorder of thyroid, unspecified: Secondary | ICD-10-CM | POA: Diagnosis not present

## 2014-03-25 LAB — CBC
HCT: 43.5 % (ref 39.0–52.0)
Hemoglobin: 14.9 g/dL (ref 13.0–17.0)
MCH: 30 pg (ref 26.0–34.0)
MCHC: 34.3 g/dL (ref 30.0–36.0)
MCV: 87.7 fL (ref 78.0–100.0)
Platelets: 216 10*3/uL (ref 150–400)
RBC: 4.96 MIL/uL (ref 4.22–5.81)
RDW: 14.5 % (ref 11.5–15.5)
WBC: 12.2 10*3/uL — ABNORMAL HIGH (ref 4.0–10.5)

## 2014-03-25 LAB — BASIC METABOLIC PANEL
Anion gap: 14 (ref 5–15)
BUN: 17 mg/dL (ref 6–23)
CO2: 28 mEq/L (ref 19–32)
Calcium: 8.8 mg/dL (ref 8.4–10.5)
Chloride: 97 mEq/L (ref 96–112)
Creatinine, Ser: 1.33 mg/dL (ref 0.50–1.35)
GFR calc Af Amer: 68 mL/min — ABNORMAL LOW (ref >90)
GFR calc non Af Amer: 59 mL/min — ABNORMAL LOW (ref >90)
Glucose, Bld: 113 mg/dL — ABNORMAL HIGH (ref 70–99)
Potassium: 4.1 mEq/L (ref 3.7–5.3)
Sodium: 139 mEq/L (ref 137–147)

## 2014-03-25 LAB — I-STAT TROPONIN, ED: Troponin i, poc: 0.01 ng/mL (ref 0.00–0.08)

## 2014-03-25 LAB — PRO B NATRIURETIC PEPTIDE: Pro B Natriuretic peptide (BNP): 654 pg/mL — ABNORMAL HIGH (ref 0–125)

## 2014-03-25 MED ORDER — SODIUM CHLORIDE 0.9 % IV BOLUS (SEPSIS)
2000.0000 mL | Freq: Once | INTRAVENOUS | Status: AC
  Administered 2014-03-25: 2000 mL via INTRAVENOUS

## 2014-03-25 NOTE — ED Provider Notes (Signed)
CSN: [id number]     Arrival date & time 03/25/14  1533 History   First MD Initiated Contact with Patient 03/25/14 1604     Chief Complaint  Patient presents with  . Weakness  . Loss of Consciousness     HPI Patient reports generalized weakness and lightheadedness over the past 2 days.  His had these symptoms before but over the past 2 days it seems to be worse.  Today he had 2 syncopal episodes associated with lightheadedness right after standing up.  His history of atrial fibrillation.  He is not on anticoagulants as he is worried about being on anticoagulants and his had this discussion with his cardiologist at [facility].  Patient denies melena or hematochezia.  No history of blood transfusions.  The patient denies chest pain or shortness of breath.  He denies abdominal pain.  Denies diarrhea.  He states that he's been trying to lose weight and his appetite has been somewhat decreased as of lately.  His mother is concerned that he is not eating or drinking enough.    Past Medical History  Diagnosis Date  . Thyroid disease   . Hypertension   . Seizures   . Depression   . Arrhythmia   . Osteoarthritis     oa in bilateral knees  . Morbid obesity, BMI unknown   . Low back pain    Past Surgical History  Procedure Laterality Date  . Achilles tendon repair  approx. 2004    right foot  . Cataract extraction      bilateral   Family History  Problem Relation Age of Onset  . Diabetes Father   . Heart disease Father   . Diabetes Brother    History  Substance Use Topics  . Smoking status: Never Smoker   . Smokeless tobacco: Never Used  . Alcohol Use: No    Review of Systems  All other systems reviewed and are negative.     Allergies  Review of patient's allergies indicates no known allergies.  Home Medications   Prior to Admission medications   Medication Sig Start Date End Date Taking? Authorizing Provider  allopurinol (ZYLOPRIM) 300 MG tablet Take 300  mg by mouth daily.    Yes Historical Provider, MD  aspirin 325 MG tablet Take 325 mg by mouth daily.   Yes Historical Provider, MD  atenolol (TENORMIN) 25 MG tablet Take 25 mg by mouth daily.    Yes Historical Provider, MD  benztropine (COGENTIN) 1 MG tablet Take 1 mg by mouth daily.   Yes Historical Provider, MD  Cholecalciferol (VITAMIN D) 1000 UNITS capsule Take 1,000 Units by mouth daily.   Yes Historical Provider, MD  desonide (DESOWEN) 0.05 % cream Apply 1 application topically 2 (two) times daily as needed (for dry skin).   Yes Historical Provider, MD  divalproex (DEPAKOTE ER) 500 MG 24 hr tablet Take 500 mg by mouth daily.    Yes Historical Provider, MD  fish oil-omega-3 fatty acids 1000 MG capsule Take 1 g by mouth 2 (two) times daily.    Yes Historical Provider, MD  levothyroxine (SYNTHROID, LEVOTHROID) 100 MCG tablet Take 100 mcg by mouth daily before breakfast.   Yes Historical Provider, MD  naproxen sodium (ANAPROX) 220 MG tablet Take 440 mg by mouth daily as needed.   Yes Historical Provider, MD  omeprazole (PRILOSEC) 20 MG capsule Take 20 mg by mouth daily.   Yes Historical Provider, MD  perphenazine (TRILAFON) 8 MG tablet  Take 24 mg by mouth at bedtime.     Yes Historical Provider, MD  phenelzine (NARDIL) 15 MG tablet Take 45 mg by mouth 2 (two) times daily.  11/27/13  Yes Historical Provider, MD  silver sulfADIAZINE (SILVADENE) 1 % cream Apply 1 application topically daily as needed (for rash).   Yes Historical Provider, MD   BP 102/68  Pulse 83  Temp(Src) 98.6 F (37 C) (Oral)  Resp 23  Ht 5\' 10"  (1.778 m)  Wt 250 lb (113.399 kg)  BMI 35.87 kg/m2  SpO2 94% Physical Exam  Nursing note and vitals reviewed. Constitutional: He is oriented to person, place, and time. He appears well-developed and well-nourished.  HENT:  Head: Normocephalic and atraumatic.  Eyes: EOM are normal.  Neck: Normal range of motion.  Cardiovascular: Normal rate, regular rhythm, normal heart sounds  and intact distal pulses.   Pulmonary/Chest: Effort normal and breath sounds normal. No respiratory distress.  Abdominal: Soft. He exhibits no distension. There is no tenderness.  Musculoskeletal: Normal range of motion.  Neurological: He is alert and oriented to person, place, and time.  Skin: Skin is warm and dry.  Psychiatric: He has a normal mood and affect. Judgment normal.    ED Course  Procedures (including critical care time) Labs Review Labs Reviewed  CBC - Abnormal; Notable for the following:    WBC 12.2 (*)    All other components within normal limits  BASIC METABOLIC PANEL - Abnormal; Notable for the following:    Glucose, Bld 113 (*)    GFR calc non Af Amer 59 (*)    GFR calc Af Amer 68 (*)    All other components within normal limits  PRO B NATRIURETIC PEPTIDE - Abnormal; Notable for the following:    Pro B Natriuretic peptide (BNP) 654.0 (*)    All other components within normal limits  I-STAT TROPOININ, ED    Imaging Review No results found.   EKG Interpretation None      MDM   Final diagnoses:  Weakness  Volume depletion    Improvement of symptoms at this time.  Discharge home in good condition.  Doubt ACS.  Doubt congestive heart failure.  No hypoxia.  No shortness of breath.  They seem to be recurring symptoms the patient.  He has similar visit with similar complaints to his cardiologist 2 to 3 weeks ago without etiology found.    Hoy Morn, MD 03/25/14 2002

## 2014-03-25 NOTE — ED Notes (Signed)
Pt reports having dizziness this morning when he got up. Had syncopal episodes today and generallized weakness, difficulty ambulating. Pt reports pain to mid chest area, hx of same. Pt took his atenolol 25mg  po 11:00. ekg done at triage, hypotensive, bp 84/60.

## 2014-04-28 ENCOUNTER — Emergency Department (HOSPITAL_COMMUNITY)
Admission: EM | Admit: 2014-04-28 | Discharge: 2014-04-28 | Disposition: A | Discharge status: Home / Self Care | Payer: Medicare Other | Attending: Emergency Medicine | Admitting: Emergency Medicine

## 2014-04-28 ENCOUNTER — Emergency Department (HOSPITAL_COMMUNITY): Payer: Medicare Other

## 2014-04-28 ENCOUNTER — Encounter (HOSPITAL_COMMUNITY): Payer: Self-pay | Admitting: Emergency Medicine

## 2014-04-28 DIAGNOSIS — I10 Essential (primary) hypertension: Secondary | ICD-10-CM | POA: Diagnosis not present

## 2014-04-28 DIAGNOSIS — Z79899 Other long term (current) drug therapy: Secondary | ICD-10-CM | POA: Diagnosis not present

## 2014-04-28 DIAGNOSIS — Z7901 Long term (current) use of anticoagulants: Secondary | ICD-10-CM | POA: Diagnosis not present

## 2014-04-28 DIAGNOSIS — F329 Major depressive disorder, single episode, unspecified: Secondary | ICD-10-CM | POA: Insufficient documentation

## 2014-04-28 DIAGNOSIS — R079 Chest pain, unspecified: Secondary | ICD-10-CM | POA: Insufficient documentation

## 2014-04-28 DIAGNOSIS — E079 Disorder of thyroid, unspecified: Secondary | ICD-10-CM | POA: Diagnosis not present

## 2014-04-28 DIAGNOSIS — R0602 Shortness of breath: Secondary | ICD-10-CM

## 2014-04-28 DIAGNOSIS — G40909 Epilepsy, unspecified, not intractable, without status epilepticus: Secondary | ICD-10-CM | POA: Diagnosis not present

## 2014-04-28 DIAGNOSIS — M1990 Unspecified osteoarthritis, unspecified site: Secondary | ICD-10-CM | POA: Diagnosis not present

## 2014-04-28 LAB — CBC
HCT: 42.3 % (ref 39.0–52.0)
Hemoglobin: 14.4 g/dL (ref 13.0–17.0)
MCH: 30.1 pg (ref 26.0–34.0)
MCHC: 34 g/dL (ref 30.0–36.0)
MCV: 88.5 fL (ref 78.0–100.0)
Platelets: 220 10*3/uL (ref 150–400)
RBC: 4.78 MIL/uL (ref 4.22–5.81)
RDW: 14.3 % (ref 11.5–15.5)
WBC: 11.8 10*3/uL — ABNORMAL HIGH (ref 4.0–10.5)

## 2014-04-28 LAB — BASIC METABOLIC PANEL
Anion gap: 12 (ref 5–15)
BUN: 16 mg/dL (ref 6–23)
CO2: 26 mEq/L (ref 19–32)
Calcium: 9.2 mg/dL (ref 8.4–10.5)
Chloride: 96 mEq/L (ref 96–112)
Creatinine, Ser: 0.92 mg/dL (ref 0.50–1.35)
GFR calc Af Amer: >90 mL/min (ref >90)
GFR calc non Af Amer: >90 mL/min (ref >90)
Glucose, Bld: 114 mg/dL — ABNORMAL HIGH (ref 70–99)
Potassium: 4.1 mEq/L (ref 3.7–5.3)
Sodium: 134 mEq/L — ABNORMAL LOW (ref 137–147)

## 2014-04-28 LAB — TROPONIN I
Troponin I: <0.3 ng/mL (ref <0.30)
Troponin I: <0.3 ng/mL (ref <0.30)

## 2014-04-28 LAB — I-STAT TROPONIN, ED: Troponin i, poc: 0 ng/mL (ref 0.00–0.08)

## 2014-04-28 LAB — D-DIMER, QUANTITATIVE (NOT AT ARMC): D-Dimer, Quant: 0.4 ug/mL-FEU (ref 0.00–0.48)

## 2014-04-28 MED ORDER — HYDROCODONE-ACETAMINOPHEN 5-325 MG PO TABS: 1.0000 | ORAL_TABLET | ORAL | Status: DC | PRN

## 2014-04-28 MED ORDER — SODIUM CHLORIDE 0.9 % IV BOLUS (SEPSIS)
500.0000 mL | Freq: Once | INTRAVENOUS | Status: AC
  Administered 2014-04-28: 500 mL via INTRAVENOUS

## 2014-04-28 MED ORDER — IOHEXOL 350 MG/ML SOLN
80.0000 mL | Freq: Once | INTRAVENOUS | Status: AC | PRN
  Administered 2014-04-28: 80 mL via INTRAVENOUS

## 2014-04-28 MED ORDER — MORPHINE SULFATE 4 MG/ML IJ SOLN
6.0000 mg | Freq: Once | INTRAMUSCULAR | Status: AC
  Administered 2014-04-28: 6 mg via INTRAVENOUS
  Filled 2014-04-28: qty 2

## 2014-04-28 NOTE — Discharge Instructions (Signed)
If you were given medicines take as directed.  If you are on coumadin or contraceptives realize their levels and effectiveness is altered by many different medicines.  If you have any reaction (rash, tongues swelling, other) to the medicines stop taking and see a physician.   Please follow up as directed and return to the ER or see a physician for new or worsening symptoms.  Thank you. Filed Vitals:   04/28/14 0230 04/28/14 0320 04/28/14 0445 04/28/14 0721  BP: 133/87 116/77 111/69 121/70  Pulse: 81 84 78 82  Resp: 21 23 19 18   SpO2: 95% 95% 96% 95%    Chest Pain (Nonspecific) It is often hard to give a specific diagnosis for the cause of chest pain. There is always a chance that your pain could be related to something serious, such as a heart attack or a blood clot in the lungs. You need to follow up with your health care provider for further evaluation. CAUSES   Heartburn.  Pneumonia or bronchitis.  Anxiety or stress.  Inflammation around your heart (pericarditis) or lung (pleuritis or pleurisy).  A blood clot in the lung.  A collapsed lung (pneumothorax). It can develop suddenly on its own (spontaneous pneumothorax) or from trauma to the chest.  Shingles infection (herpes zoster virus). The chest wall is composed of bones, muscles, and cartilage. Any of these can be the source of the pain.  The bones can be bruised by injury.  The muscles or cartilage can be strained by coughing or overwork.  The cartilage can be affected by inflammation and become sore (costochondritis). DIAGNOSIS  Lab tests or other studies may be needed to find the cause of your pain. Your health care provider may have you take a test called an ambulatory electrocardiogram (ECG). An ECG records your heartbeat patterns over a 24-hour period. You may also have other tests, such as:  Transthoracic echocardiogram (TTE). During echocardiography, sound waves are used to evaluate how blood flows through your  heart.  Transesophageal echocardiogram (TEE).  Cardiac monitoring. This allows your health care provider to monitor your heart rate and rhythm in real time.  Holter monitor. This is a portable device that records your heartbeat and can help diagnose heart arrhythmias. It allows your health care provider to track your heart activity for several days, if needed.  Stress tests by exercise or by giving medicine that makes the heart beat faster. TREATMENT   Treatment depends on what may be causing your chest pain. Treatment may include:  Acid blockers for heartburn.  Anti-inflammatory medicine.  Pain medicine for inflammatory conditions.  Antibiotics if an infection is present.  You may be advised to change lifestyle habits. This includes stopping smoking and avoiding alcohol, caffeine, and chocolate.  You may be advised to keep your head raised (elevated) when sleeping. This reduces the chance of acid going backward from your stomach into your esophagus. Most of the time, nonspecific chest pain will improve within 2-3 days with rest and mild pain medicine.  HOME CARE INSTRUCTIONS   If antibiotics were prescribed, take them as directed. Finish them even if you start to feel better.  For the next few days, avoid physical activities that bring on chest pain. Continue physical activities as directed.  Do not use any tobacco products, including cigarettes, chewing tobacco, or electronic cigarettes.  Avoid drinking alcohol.  Only take medicine as directed by your health care provider.  Follow your health care provider's suggestions for further testing if your chest  pain does not go away.  Keep any follow-up appointments you made. If you do not go to an appointment, you could develop lasting (chronic) problems with pain. If there is any problem keeping an appointment, call to reschedule. SEEK MEDICAL CARE IF:   Your chest pain does not go away, even after treatment.  You have a rash  with blisters on your chest.  You have a fever. SEEK IMMEDIATE MEDICAL CARE IF:   You have increased chest pain or pain that spreads to your arm, neck, jaw, back, or abdomen.  You have shortness of breath.  You have an increasing cough, or you cough up blood.  You have severe back or abdominal pain.  You feel nauseous or vomit.  You have severe weakness.  You faint.  You have chills. This is an emergency. Do not wait to see if the pain will go away. Get medical help at once. Call your local emergency services (911 in [place] Do not drive yourself to the hospital. MAKE SURE YOU:   Understand these instructions.  Will watch your condition.  Will get help right away if you are not doing well or get worse. Document Released: 04/01/2005 Document Revised: 06/27/2013 Document Reviewed: 01/26/2008 [facility] Patient Information 2015 [place], [place]. This information is not intended to replace advice given to you by your health care provider. Make sure you discuss any questions you have with your health care provider.

## 2014-04-28 NOTE — ED Provider Notes (Signed)
CSN: [id number]     Arrival date & time 04/28/14  0154 History   First MD Initiated Contact with Patient 04/28/14 0241     Chief Complaint  Patient presents with  . Chest Pain     (Consider location/radiation/quality/duration/timing/severity/associated sxs/prior Treatment) HPI Comments: Patient with high blood pressure, seizures, arthritis, atrial fibrillation on Eliquis presents after sudden onset and nonradiating anterior chest pressure at 5 PM yesterday, mild shortness of breath. Patient has symptoms with inspiration and position. Bilateral symptoms. Patient had milder similar symptoms in the past. No heart attack, heart failure or blood clot history.Patient denies blood clot history, active cancer, recent major trauma or surgery, unilateral leg swelling/ pain, recent long travel, hemoptysis or oral contraceptives. Patient has no symptoms currently. No exertional or diaphoretic symptoms  Patient is a 53 y.o. male presenting with chest pain. The history is provided by the patient.  Chest Pain Associated symptoms: shortness of breath   Associated symptoms: no abdominal pain, no back pain, no fever, no headache and not vomiting     Past Medical History  Diagnosis Date  . Thyroid disease   . Hypertension   . Seizures   . Depression   . Arrhythmia   . Osteoarthritis     oa in bilateral knees  . Morbid obesity, BMI unknown   . Low back pain    Past Surgical History  Procedure Laterality Date  . Achilles tendon repair  approx. 2004    right foot  . Cataract extraction      bilateral   Family History  Problem Relation Age of Onset  . Diabetes Father   . Heart disease Father   . Diabetes Brother    History  Substance Use Topics  . Smoking status: Never Smoker   . Smokeless tobacco: Never Used  . Alcohol Use: No    Review of Systems  Constitutional: Negative for fever and chills.  HENT: Negative for congestion.   Eyes: Negative for visual disturbance.  Respiratory:  Positive for shortness of breath.   Cardiovascular: Positive for chest pain. Negative for leg swelling.  Gastrointestinal: Negative for vomiting and abdominal pain.  Genitourinary: Negative for dysuria and flank pain.  Musculoskeletal: Negative for back pain, neck pain and neck stiffness.  Skin: Negative for rash.  Neurological: Negative for light-headedness and headaches.      Allergies  Review of patient's allergies indicates no known allergies.  Home Medications   Prior to Admission medications   Medication Sig Start Date End Date Taking? Authorizing Provider  allopurinol (ZYLOPRIM) 300 MG tablet Take 300 mg by mouth daily.    Yes Historical Provider, MD  apixaban (ELIQUIS) 5 MG TABS tablet Take 5 mg by mouth 2 (two) times daily.   Yes Historical Provider, MD  benztropine (COGENTIN) 1 MG tablet Take 1 mg by mouth daily.   Yes Historical Provider, MD  Cholecalciferol (VITAMIN D) 1000 UNITS capsule Take 1,000 Units by mouth daily.   Yes Historical Provider, MD  desonide (DESOWEN) 0.05 % cream Apply 1 application topically 2 (two) times daily as needed (for dry skin).   Yes Historical Provider, MD  divalproex (DEPAKOTE ER) 500 MG 24 hr tablet Take 500 mg by mouth daily.    Yes Historical Provider, MD  fish oil-omega-3 fatty acids 1000 MG capsule Take 1 g by mouth 2 (two) times daily.    Yes Historical Provider, MD  levothyroxine (SYNTHROID, LEVOTHROID) 100 MCG tablet Take 100 mcg by mouth daily before breakfast.   Yes  Historical Provider, MD  perphenazine (TRILAFON) 8 MG tablet Take 24 mg by mouth at bedtime.     Yes Historical Provider, MD  phenelzine (NARDIL) 15 MG tablet Take 45 mg by mouth 2 (two) times daily.  11/27/13  Yes Historical Provider, MD  silver sulfADIAZINE (SILVADENE) 1 % cream Apply 1 application topically daily as needed (for rash).   Yes Historical Provider, MD   BP 111/69  Pulse 78  Resp 19  SpO2 96% Physical Exam  Nursing note and vitals  reviewed. Constitutional: He is oriented to person, place, and time. He appears well-developed and well-nourished.  HENT:  Head: Normocephalic and atraumatic.  Eyes: Conjunctivae are normal. Right eye exhibits no discharge. Left eye exhibits no discharge.  Neck: Normal range of motion. Neck supple. No tracheal deviation present.  Cardiovascular: Normal rate, regular rhythm and intact distal pulses.   No murmur heard. Pulmonary/Chest: Effort normal and breath sounds normal.  Abdominal: Soft. He exhibits no distension. There is no tenderness. There is no guarding.  Musculoskeletal: He exhibits no edema.  Neurological: He is alert and oriented to person, place, and time.  Skin: Skin is warm. No rash noted.  Psychiatric: He has a normal mood and affect.    ED Course  Procedures (including critical care time) Labs Review Labs Reviewed  CBC - Abnormal; Notable for the following:    WBC 11.8 (*)    All other components within normal limits  BASIC METABOLIC PANEL - Abnormal; Notable for the following:    Sodium 134 (*)    Glucose, Bld 114 (*)    All other components within normal limits  D-DIMER, QUANTITATIVE  TROPONIN I  I-STAT TROPOININ, ED    Imaging Review Dg Chest 2 View  04/28/2014   CLINICAL DATA:  Sudden onset non radiating central chest pressure starting at 1700 hr yesterday. Shortness of breath with deep inspiration.  EXAM: CHEST  2 VIEW  COMPARISON:  09/13/2013  FINDINGS: Linear fibrosis in the left lung base without change since prior study. Normal heart size and pulmonary vascularity. No focal airspace disease or consolidation in the lungs. No blunting of costophrenic angles. No pneumothorax. Mediastinal contours appear intact. Degenerative changes in the spine.  IMPRESSION: Linear fibrosis in the left lung base. No evidence of active pulmonary disease.   Electronically Signed   By: Lucienne Capers M.D.   On: 04/28/2014 03:13     EKG Interpretation   Date/Time:   Saturday April 28 2014 01:59:08 EDT Ventricular Rate:  82 PR Interval:  194 QRS Duration: 84 QT Interval:  354 QTC Calculation: 413 R Axis:   17 Text Interpretation:  Normal sinus rhythm No acute findings Confirmed by  Davelle Anselmi  MD, Janai Brannigan ([id number]) on 04/28/2014 5:16:42 AM      MDM   Final diagnoses:  Chest pain   Well-appearing male, low risk cardiac and atypical presentation. D-dimer ordered due to pleuritic chest pain and mild shortness of breath, patient is low-risk and d-dimer negative, no indication for CT scan at this time. Patient asymptomatic in ER. Vitals unremarkable. Cardiac risk factors patient is low risk, plan for delta troponin and if negative discussed close followup outpatient primary Dr. and/or local cardiologist.  On reassessment of patient's chest discomfort mild worsening. Patient's troponins negative, d-dimer negative. Discussed very low likelihood this is a pulmonary embolism. With patient feeling worse discussed risks and benefits of CT angio chest for further evaluation of his chest pain that is positional. Patient wishes to proceed for further  detail of this chest pain that he has not had in the past. Patient prefers to followup outpatient CT angina negative. Patient's care be signed out to followup CT result for final disposition which will likely be outpatient with cardiology and primary Dr. Results and differential diagnosis were discussed with the patient/parent/guardian. Close follow up outpatient was discussed, comfortable with the plan.   Medications - No data to display  Filed Vitals:   04/28/14 0230 04/28/14 0320 04/28/14 0445  BP: 133/87 116/77 111/69  Pulse: 81 84 78  Resp: 21 23 19   SpO2: 95% 95% 96%    Final diagnoses:  Chest pain        Mariea Clonts, MD 04/28/14 [phone]

## 2014-04-28 NOTE — ED Notes (Signed)
Pt returned to exam room from CT. Resting quietly at the time. Vital signs stable.

## 2014-04-28 NOTE — ED Notes (Signed)
Pt reports a sudden onset of non radiating central chest pressure starting at 1700 yesterday afternoon. Pt reports some shortness of breath and increase pressure with deep inspiration, denies diaphoresis, n/v/d. Pt takes Eliquis for A-fib

## 2014-04-28 NOTE — ED Provider Notes (Signed)
Seen by Dr Reather Converse.  Plan for outpatient follow up if the 3rd troponin and ct angio chest is normal.  9:29 AM I discussed the findings with the patient.  CT unremarkable.  3 sets of troponin normal.  At this time there does not appear to be any evidence of an acute emergency medical condition and the patient appears stable for discharge with appropriate outpatient follow up.   Dorie Rank, MD 04/28/14 [phone]

## 2014-05-15 ENCOUNTER — Emergency Department (HOSPITAL_COMMUNITY)
Admission: EM | Admit: 2014-05-15 | Discharge: 2014-05-15 | Discharge status: Against Medical Advice | Payer: Medicare Other | Attending: Emergency Medicine | Admitting: Emergency Medicine

## 2014-05-15 ENCOUNTER — Encounter (HOSPITAL_COMMUNITY): Payer: Self-pay | Admitting: Emergency Medicine

## 2014-05-15 DIAGNOSIS — R0602 Shortness of breath: Secondary | ICD-10-CM | POA: Diagnosis present

## 2014-05-15 DIAGNOSIS — R0782 Intercostal pain: Secondary | ICD-10-CM | POA: Diagnosis not present

## 2014-05-15 DIAGNOSIS — R1013 Epigastric pain: Secondary | ICD-10-CM | POA: Insufficient documentation

## 2014-05-15 DIAGNOSIS — I10 Essential (primary) hypertension: Secondary | ICD-10-CM | POA: Insufficient documentation

## 2014-05-15 LAB — BASIC METABOLIC PANEL
Anion gap: 14 (ref 5–15)
BUN: 18 mg/dL (ref 6–23)
CO2: 27 mEq/L (ref 19–32)
Calcium: 9.3 mg/dL (ref 8.4–10.5)
Chloride: 98 mEq/L (ref 96–112)
Creatinine, Ser: 0.84 mg/dL (ref 0.50–1.35)
GFR calc Af Amer: >90 mL/min (ref >90)
GFR calc non Af Amer: >90 mL/min (ref >90)
Glucose, Bld: 161 mg/dL — ABNORMAL HIGH (ref 70–99)
Potassium: 4 mEq/L (ref 3.7–5.3)
Sodium: 139 mEq/L (ref 137–147)

## 2014-05-15 LAB — CBC WITH DIFFERENTIAL/PLATELET
Basophils Absolute: 0 10*3/uL (ref 0.0–0.1)
Basophils Relative: 0 % (ref 0–1)
Eosinophils Absolute: 0.3 10*3/uL (ref 0.0–0.7)
Eosinophils Relative: 3 % (ref 0–5)
HCT: 41.3 % (ref 39.0–52.0)
Hemoglobin: 13.6 g/dL (ref 13.0–17.0)
Lymphocytes Relative: 11 % — ABNORMAL LOW (ref 12–46)
Lymphs Abs: 1.3 10*3/uL (ref 0.7–4.0)
MCH: 29.8 pg (ref 26.0–34.0)
MCHC: 32.9 g/dL (ref 30.0–36.0)
MCV: 90.6 fL (ref 78.0–100.0)
Monocytes Absolute: 1.3 10*3/uL — ABNORMAL HIGH (ref 0.1–1.0)
Monocytes Relative: 10 % (ref 3–12)
Neutro Abs: 9.3 10*3/uL — ABNORMAL HIGH (ref 1.7–7.7)
Neutrophils Relative %: 76 % (ref 43–77)
Platelets: 367 10*3/uL (ref 150–400)
RBC: 4.56 MIL/uL (ref 4.22–5.81)
RDW: 14.5 % (ref 11.5–15.5)
WBC: 12.2 10*3/uL — ABNORMAL HIGH (ref 4.0–10.5)

## 2014-05-15 LAB — I-STAT TROPONIN, ED: Troponin i, poc: 0.01 ng/mL (ref 0.00–0.08)

## 2014-05-15 LAB — PROTIME-INR
INR: 1.43 (ref 0.00–1.49)
Prothrombin Time: 17.6 seconds — ABNORMAL HIGH (ref 11.6–15.2)

## 2014-05-15 LAB — PRO B NATRIURETIC PEPTIDE: Pro B Natriuretic peptide (BNP): 378.6 pg/mL — ABNORMAL HIGH (ref 0–125)

## 2014-05-15 NOTE — ED Notes (Signed)
The patient says he was seen here two weeks ago with chest pain and epigastric pain.  The patient said he was discharged with nonspecific chest pain.  The patient says when he lays flat in the bed, he gets extremely SOB.  He also says he has SOB with exertion.  He says his SOB never went away from two weeks ago.  He advises me he is frustrated because he doesn't know why the MDs can't find anything.  He is here to be evaluated.

## 2014-07-31 ENCOUNTER — Encounter (HOSPITAL_COMMUNITY): Payer: Self-pay | Admitting: Psychiatry

## 2014-07-31 ENCOUNTER — Ambulatory Visit (INDEPENDENT_AMBULATORY_CARE_PROVIDER_SITE_OTHER): Payer: Medicare Other | Admitting: Psychiatry

## 2014-07-31 VITALS — BP 125/76 | HR 89 | Ht 71.0 in | Wt 262.0 lb

## 2014-07-31 DIAGNOSIS — F331 Major depressive disorder, recurrent, moderate: Secondary | ICD-10-CM | POA: Diagnosis not present

## 2014-07-31 DIAGNOSIS — F429 Obsessive-compulsive disorder, unspecified: Secondary | ICD-10-CM

## 2014-07-31 DIAGNOSIS — F42 Obsessive-compulsive disorder: Secondary | ICD-10-CM

## 2014-07-31 DIAGNOSIS — F251 Schizoaffective disorder, depressive type: Secondary | ICD-10-CM

## 2014-07-31 DIAGNOSIS — G40109 Localization-related (focal) (partial) symptomatic epilepsy and epileptic syndromes with simple partial seizures, not intractable, without status epilepticus: Secondary | ICD-10-CM

## 2014-07-31 MED ORDER — PHENELZINE SULFATE 15 MG PO TABS: 45.0000 mg | ORAL_TABLET | Freq: Two times a day (BID) | ORAL | Status: DC

## 2014-07-31 MED ORDER — PERPHENAZINE 8 MG PO TABS: 24.0000 mg | ORAL_TABLET | Freq: Every day | ORAL | Status: DC

## 2014-07-31 MED ORDER — BENZTROPINE MESYLATE 1 MG PO TABS: 1.0000 mg | ORAL_TABLET | Freq: Every day | ORAL | Status: DC

## 2014-07-31 MED ORDER — DIVALPROEX SODIUM ER 500 MG PO TB24: 500.0000 mg | ORAL_TABLET | Freq: Every day | ORAL | Status: DC

## 2014-07-31 NOTE — Progress Notes (Signed)
Patient ID: TAVARIUS GREWE, male   DOB: Apr 25, 1961, 54 y.o.   MRN: [id number]  [place] Initial Psychiatric Assessment   JACARI IANNELLO [id number] 54 y.o.  07/31/2014 2:37 PM  Chief Complaint:  Want to get established psychiatry care with this clinic and for medications for depression.  History of Present Illness:   Patient Presents for Initial Evaluation with symptoms of depression which she describes to be stable as of now the current medication regimen. He has been treated for major depressive disorder possible schizoaffective depressed type and OCD. He has been followed recently by [facility] but wants to change his provider. Says that he is on Nardil for the 35 years and has only medication that helps his OCD symptoms and keeps his depression stable he understands all the risk associated with this medication and is well versed with this medication.  He is taking Depakote for temporal lobe seizure and mood stabilization. He is also taking Cogentin for side effects Trilafon for OCD and possible schizoaffective disorder.  Currently does not endorse hopelessness helplessness suicidal or homicidal thoughts. Congruently does not endorse psychotic symptoms delusions or hallucinations. He follows closely with his other providers including cardiologist and has an appointment pending with the neurologist  Says that when he is not on his medication over the past he has had depressive episodes which has led him to get hospitalized. He endorses feeling depressed when he is not on medication and it may go to the point that he may start feeling suicidal. Patient has never attempted suicide  He also endorses OCD symptoms in the past of checking and counting the point that it would get more intrusive and he will spend a lot of time with these intrusive thoughts and become dysfunctional. There was a time he was having intrusive thoughts about people watching or he wanted to hurt people  but that has been in the remote past and believes that his current medications has kept him stable.  There is no active psychotic symptoms as of now does not endorse panic like symptoms or excessive worries.  Currently does not endorse any manic symptoms is no clear history of mania but he does have history of intrusive thoughts as excessive energy and racing thoughts in the past. Overall he feels at his baseline with the current medication regimen and understands that he may be on a higher dose of Nardil and he also understands all the risk associated related with it and wants to ontinue this medication.  i Aggravating factors of depression; difficult mom when he was growing up and also current medical issues there is a history of confusion in 1985 and recurrent admission diagnosis temporal lobe seizures at that time.  Modifying factors; going for a walk and keep himself active and positive Scale of depression out of 10. 8 out of 10. 10 being no depression   Past Psychiatric History/Hospitalization(s) Multiple hospitalizations from [phone] when he was young late teenage years and early 10s. Says that he was getting treatment for depression and suicidal thoughts also has been diagnosed with OCD. Remote history of possible schizoaffective disorder as he does endorse that he would get somewhat paranoid or intrusive thoughts. After that he is mostly followed outpatient services with a psychiatrist in [place] Dr. Danny Lawless, who retired couple of years ago so he started with [facility] outpatient clinic but is not satisfied and wants to change this provider.  Hospitalization for psychiatric illness: Yes History of Electroconvulsive Shock Therapy: No Prior  Suicide Attempts: No  Medical History; Past Medical History  Diagnosis Date  . Thyroid disease   . Hypertension   . Seizures   . Depression   . Arrhythmia   . Osteoarthritis     oa in bilateral knees  . Morbid obesity, BMI unknown   .  Low back pain     Allergies: No Known Allergies  Medications: Outpatient Encounter Prescriptions as of 07/31/2014  Medication Sig  . allopurinol (ZYLOPRIM) 300 MG tablet Take 300 mg by mouth daily.   Marland Kitchen apixaban (ELIQUIS) 5 MG TABS tablet Take 5 mg by mouth 2 (two) times daily.  . benztropine (COGENTIN) 1 MG tablet Take 1 tablet (1 mg total) by mouth daily.  . Cholecalciferol (VITAMIN D) 1000 UNITS capsule Take 1,000 Units by mouth daily.  Marland Kitchen desonide (DESOWEN) 0.05 % cream Apply 1 application topically 2 (two) times daily as needed (for dry skin).  Marland Kitchen divalproex (DEPAKOTE ER) 500 MG 24 hr tablet Take 1 tablet (500 mg total) by mouth daily.  . fish oil-omega-3 fatty acids 1000 MG capsule Take 1 g by mouth 2 (two) times daily.   Marland Kitchen levothyroxine (SYNTHROID, LEVOTHROID) 100 MCG tablet Take 100 mcg by mouth daily before breakfast.  . perphenazine (TRILAFON) 8 MG tablet Take 3 tablets (24 mg total) by mouth at bedtime.  . phenelzine (NARDIL) 15 MG tablet Take 3 tablets (45 mg total) by mouth 2 (two) times daily.  . silver sulfADIAZINE (SILVADENE) 1 % cream Apply 1 application topically daily as needed (for rash).  . [DISCONTINUED] benztropine (COGENTIN) 1 MG tablet Take 1 mg by mouth daily.  . [DISCONTINUED] divalproex (DEPAKOTE ER) 500 MG 24 hr tablet Take 500 mg by mouth daily.   . [DISCONTINUED] perphenazine (TRILAFON) 8 MG tablet Take 24 mg by mouth at bedtime.    . [DISCONTINUED] phenelzine (NARDIL) 15 MG tablet Take 45 mg by mouth 2 (two) times daily.   . [DISCONTINUED] HYDROcodone-acetaminophen (NORCO/VICODIN) 5-325 MG per tablet Take 1-2 tablets by mouth every 4 (four) hours as needed. (Patient not taking: Reported on 07/31/2014)     Substance Abuse History:  Denies any use of alcohol peers there is no history suggestive of substance abuse or alcoholism. Family History; Family History  Problem Relation Age of Onset  . Diabetes Father   . Heart disease Father   . Diabetes Brother       Biopsychosocial History:  Grew up with his parents. He has a difficult time with his mom was always negative and putting them down. His dad was helping him gently keeping selective sports but at times the patient felt that he was trying to push him too much.  He did have older brother. He finished high school and has done difficult to work with mostly due to difficulties of 13 years. Currently is on disability for his multiple medical issues including osteoarthritis atrial fibrillation see chart  There is no current legal issues as not have any case patient is also not married and lives by himself.    Labs:  Recent Results (from the past 2160 hour(s))  Basic metabolic panel     Status: Abnormal   Collection Time: 05/15/14  8:50 PM  Result Value Ref Range   Sodium 139 137 - 147 mEq/L   Potassium 4.0 3.7 - 5.3 mEq/L   Chloride 98 96 - 112 mEq/L   CO2 27 19 - 32 mEq/L   Glucose, Bld 161 (H) 70 - 99 mg/dL   BUN 18  6 - 23 mg/dL   Creatinine, Ser 0.84 0.50 - 1.35 mg/dL   Calcium 9.3 8.4 - 10.5 mg/dL   GFR calc non Af Amer >90 >90 mL/min   GFR calc Af Amer >90 >90 mL/min    Comment: (NOTE) The eGFR has been calculated using the CKD EPI equation. This calculation has not been validated in all clinical situations. eGFR's persistently <90 mL/min signify possible Chronic Kidney Disease.    Anion gap 14 5 - 15  BNP (order ONLY if patient complains of dyspnea/SOB AND you have documented it for THIS visit)     Status: Abnormal   Collection Time: 05/15/14  8:50 PM  Result Value Ref Range   Pro B Natriuretic peptide (BNP) 378.6 (H) 0 - 125 pg/mL  Protime-INR (if pt is taking Coumadin)     Status: Abnormal   Collection Time: 05/15/14  8:50 PM  Result Value Ref Range   Prothrombin Time 17.6 (H) 11.6 - 15.2 seconds   INR 1.43 0.00 - 1.49  CBC with Differential     Status: Abnormal   Collection Time: 05/15/14  8:50 PM  Result Value Ref Range   WBC 12.2 (H) 4.0 - 10.5 K/uL   RBC 4.56  4.22 - 5.81 MIL/uL   Hemoglobin 13.6 13.0 - 17.0 g/dL   HCT 41.3 39.0 - 52.0 %   MCV 90.6 78.0 - 100.0 fL   MCH 29.8 26.0 - 34.0 pg   MCHC 32.9 30.0 - 36.0 g/dL   RDW 14.5 11.5 - 15.5 %   Platelets 367 150 - 400 K/uL   Neutrophils Relative % 76 43 - 77 %   Neutro Abs 9.3 (H) 1.7 - 7.7 K/uL   Lymphocytes Relative 11 (L) 12 - 46 %   Lymphs Abs 1.3 0.7 - 4.0 K/uL   Monocytes Relative 10 3 - 12 %   Monocytes Absolute 1.3 (H) 0.1 - 1.0 K/uL   Eosinophils Relative 3 0 - 5 %   Eosinophils Absolute 0.3 0.0 - 0.7 K/uL   Basophils Relative 0 0 - 1 %   Basophils Absolute 0.0 0.0 - 0.1 K/uL  I-stat troponin, ED (not at [facility])     Status: None   Collection Time: 05/15/14  8:58 PM  Result Value Ref Range   Troponin i, poc 0.01 0.00 - 0.08 ng/mL   Comment 3            Comment: Due to the release kinetics of cTnI, a negative result within the first hours of the onset of symptoms does not rule out myocardial infarction with certainty. If myocardial infarction is still suspected, repeat the test at appropriate intervals.        Musculoskeletal: Strength & Muscle Tone: within normal limits Gait & Station: normal Patient leans: N/A  Mental Status Examination;   Psychiatric Specialty Exam: Physical Exam  Constitutional: He appears well-developed.  HENT:  Head: Normocephalic and atraumatic.  Skin: He is not diaphoretic.    Review of Systems  Constitutional: Negative.   Respiratory: Negative for cough.   Cardiovascular: Negative for chest pain.  Gastrointestinal: Negative for nausea.  Skin: Negative for rash.  Neurological: Negative for tremors and headaches.  Psychiatric/Behavioral: Positive for depression. Negative for suicidal ideas and substance abuse.    Blood pressure 125/76, pulse 89, height _0  (1.803 m), weight 262 lb (118.842 kg).Body mass index is 36.56 kg/(m^2).  General Appearance: Casual, obese  Eye Contact::  Fair  Speech:  Slow  Volume:  Normal  Mood:   Euthymic  Affect:  Congruent  Thought Process:  Coherent and Linear  Orientation:  Full (Time, Place, and Person)  Thought Content:  Rumination  Suicidal Thoughts:  No  Homicidal Thoughts:  No  Memory:  Immediate;   Fair Recent;   Fair  Judgement:  Fair  Insight:  Fair  Psychomotor Activity:  Normal  Concentration:  Fair  Recall:  Fair  Akathisia:  Negative  Handed:  Right  AIMS (if indicated):     Assets:  Desire for Improvement Financial Resources/Insurance Vocational/Educational  Sleep:        Assessment: Axis I: Major depressive disorder, recurrent moderate (stable on meds). R/o Schizoaffective disorder (as per history -suggestive of paranoia and obsessive toughts of injuring people in remote past), OCD per history (Nardil has stablized these symptoms)  Axis II: deffered  Axis III:  Past Medical History  Diagnosis Date  . Thyroid disease   . Hypertension   . Seizures   . Depression   . Arrhythmia   . Osteoarthritis     oa in bilateral knees  . Morbid obesity, BMI unknown   . Low back pain     Axis IV: psyosocial. Medical. Single   Treatment Plan and Summary: Continues to endorse feeling euthymic and baseline on current medications does not want to change. He understands Nardil is a MAOi oxidase inhibitor. States that he has been on this medication for 35 years he understands all the risks including hypertension crisis including worsening of depression including the risk associated with heart problems. States his cardiologist is also her that he is on medication. Patient wants to continue current dose despite understanding the side dose but he believes if the dose is lowered he may go back into depression or his OCD symptoms and says that he has been on this medication dose and this is the only dose that helps. Continue Nardil 45 mg twice a day.  Continue Trilafon 8 mg 3 tablets at night for his obsessive-compulsive traits including paranoia and intrusive thoughts  that become problematic when he is not taking it.  Continue Cogentin 1 mg for extrapyramidal side effects  Continue Depakote 500 mg. Says that it has been helping the temporal lobe seizure. He has an appointment neurologist and will reassess the dose or refills onwarsds from him  Pertinent Labs and Relevant Prior Notes reviewed. Medication Side effects, benefits and risks reviewed/discussed with Patient. Time given for patient to respond and asks questions regarding the Diagnosis and Medications. Safety concerns and to report to ER if suicidal or call 911. Relevant Medications refilled or called in to pharmacy. Discussed weight maintenance and Sleep Hygiene. Follow up with Primary care provider in regards to Medical conditions. Recommend compliance with medications and follow up office appointments. Discussed to avail opportunity to consider or/and continue Individual therapy with Counselor. Greater than 50% of time was spend in counseling and coordination of care with the patient.  Schedule for Follow up visit in 4 to 6  or call in earlier as necessary.   Merian Capron, MD 07/31/2014

## 2014-08-01 ENCOUNTER — Telehealth (HOSPITAL_COMMUNITY): Payer: Self-pay | Admitting: *Deleted

## 2014-08-01 NOTE — Telephone Encounter (Signed)
Pt called and would like to transfer to [facility] because it is closer to pt home. Pt was informed, he must speak with Dr. De Nurse at next appt for possible transfer. Pt states he is stable and will wait until his appt on 3/29 to speak with Dr. De Nurse.  Pt verbally states and shows understanding.

## 2014-10-02 ENCOUNTER — Ambulatory Visit (HOSPITAL_COMMUNITY): Payer: Self-pay | Admitting: Psychiatry

## 2014-10-08 ENCOUNTER — Ambulatory Visit (HOSPITAL_COMMUNITY): Payer: Self-pay | Admitting: Psychiatry

## 2014-10-12 ENCOUNTER — Other Ambulatory Visit (HOSPITAL_COMMUNITY): Payer: Self-pay | Admitting: Psychiatry

## 2014-10-18 ENCOUNTER — Telehealth (HOSPITAL_COMMUNITY): Payer: Self-pay | Admitting: *Deleted

## 2014-10-18 MED ORDER — PHENELZINE SULFATE 15 MG PO TABS: 45.0000 mg | ORAL_TABLET | Freq: Two times a day (BID) | ORAL | Status: DC

## 2014-10-18 NOTE — Telephone Encounter (Signed)
Received fax from [facility] for a refill for phenelzine (NARDIL) 15 MG tablet.  Per Dr. De Nurse, pt is authorized for a refill for phenelzine (NARDIL) 15 MG, Qty 180. Prescription was sent to pharmacy. Called and informed pt of prescription status. Pt has a f/u appt  On 4/19. Pt states and shows understanding.

## 2014-10-23 ENCOUNTER — Encounter (HOSPITAL_COMMUNITY): Payer: Self-pay | Admitting: Psychiatry

## 2014-10-23 ENCOUNTER — Ambulatory Visit (INDEPENDENT_AMBULATORY_CARE_PROVIDER_SITE_OTHER): Payer: Medicare Other | Admitting: Psychiatry

## 2014-10-23 VITALS — BP 126/78 | HR 78 | Ht 71.0 in | Wt 270.0 lb

## 2014-10-23 DIAGNOSIS — G40109 Localization-related (focal) (partial) symptomatic epilepsy and epileptic syndromes with simple partial seizures, not intractable, without status epilepticus: Secondary | ICD-10-CM

## 2014-10-23 DIAGNOSIS — F429 Obsessive-compulsive disorder, unspecified: Secondary | ICD-10-CM

## 2014-10-23 DIAGNOSIS — F321 Major depressive disorder, single episode, moderate: Secondary | ICD-10-CM

## 2014-10-23 DIAGNOSIS — F251 Schizoaffective disorder, depressive type: Secondary | ICD-10-CM

## 2014-10-23 MED ORDER — PERPHENAZINE 8 MG PO TABS: 24.0000 mg | ORAL_TABLET | Freq: Every day | ORAL | Status: DC

## 2014-10-23 MED ORDER — BENZTROPINE MESYLATE 1 MG PO TABS: 1.0000 mg | ORAL_TABLET | Freq: Every day | ORAL | Status: DC

## 2014-10-23 NOTE — Progress Notes (Signed)
Patient ID: [name], male   DOB: [birth year], 54 y.o.   MRN: [id number]  [place] Outpatient follow up visit  [name] [id number] 54 y.o.  10/23/2014 1:40 PM  Chief Complaint:  Follow up History of Present Illness:   Patient Presents for follow up and medication management for Schizoaffective disorder, possible depressive disorder and OCD.He has been followed recently by [facility] but wants to change his provider. Says that he is on Nardil for the 35 years and has only medication that helps his OCD symptoms and keeps his depression stable he understands all the risk associated with this medication and is well versed with this medication.  He is taking Depakote for temporal lobe seizure and mood stabilization. He is also taking Cogentin for side effects Trilafon for OCD and possible schizoaffective disorder. Currently does not endorse hopelessness helplessness suicidal or homicidal thoughts. Congruently does not endorse psychotic symptoms delusions or hallucinations. He follows closely with his other providers including cardiologist and has an appointment pending with the neurologist  Says that when he is not on his medication over the past he has had depressive episodes which has led him to get hospitalized. He endorses feeling depressed when he is not on medication and it may go to the point that he may start feeling suicidal. Patient has never attempted suicide  On evaluation today again he remains baseline with no side effects. Mood is balanced , he mostly talked about his foot pain and varicose veins for which he is waiting appointments.   There is no active psychotic symptoms as of now does not endorse panic like symptoms or excessive worries.  He wants now to be transferred to [facility] because he lives there. So requesting clinc follow up there.   i Aggravating factors of depression; difficult mom when he was growing up and also current medical issues  there is a history of confusion in 1985 and recurrent admission diagnosis temporal lobe seizures at that time.  Modifying factors; going for a walk and keep himself active and positive Scale of depression out of 10. 8 out of 10. 10 being no depression   Past Psychiatric History/Hospitalization(s) Multiple hospitalizations from [phone] when he was young late teenage years and early 44s. Says that he was getting treatment for depression and suicidal thoughts also has been diagnosed with OCD. Remote history of possible schizoaffective disorder as he does endorse that he would get somewhat paranoid or intrusive thoughts. After that he is mostly followed outpatient services with a psychiatrist in [place] Dr. Danny Lawless, who retired couple of years ago so he started with [facility] outpatient clinic but is not satisfied and wants to change this provider.  Hospitalization for psychiatric illness: Yes History of Electroconvulsive Shock Therapy: No Prior Suicide Attempts: No  Medical History; Past Medical History  Diagnosis Date  . Thyroid disease   . Hypertension   . Seizures   . Depression   . Arrhythmia   . Osteoarthritis     oa in bilateral knees  . Morbid obesity, BMI unknown   . Low back pain     Allergies: No Known Allergies  Medications: Outpatient Encounter Prescriptions as of 10/23/2014  Medication Sig  . allopurinol (ZYLOPRIM) 300 MG tablet Take 300 mg by mouth daily.   Marland Kitchen apixaban (ELIQUIS) 5 MG TABS tablet Take 5 mg by mouth 2 (two) times daily.  . benztropine (COGENTIN) 1 MG tablet Take 1 tablet (1 mg total) by mouth daily.  . Cholecalciferol (  VITAMIN D) 1000 UNITS capsule Take 1,000 Units by mouth daily.  Marland Kitchen desonide (DESOWEN) 0.05 % cream Apply 1 application topically 2 (two) times daily as needed (for dry skin).  Marland Kitchen divalproex (DEPAKOTE ER) 500 MG 24 hr tablet Take 1 tablet (500 mg total) by mouth daily.  . fish oil-omega-3 fatty acids 1000 MG capsule Take 1 g by mouth 2  (two) times daily.   Marland Kitchen levothyroxine (SYNTHROID, LEVOTHROID) 100 MCG tablet Take 100 mcg by mouth daily before breakfast.  . perphenazine (TRILAFON) 8 MG tablet Take 3 tablets (24 mg total) by mouth at bedtime.  . phenelzine (NARDIL) 15 MG tablet Take 3 tablets (45 mg total) by mouth 2 (two) times daily.  . silver sulfADIAZINE (SILVADENE) 1 % cream Apply 1 application topically daily as needed (for rash).  . [DISCONTINUED] benztropine (COGENTIN) 1 MG tablet Take 1 tablet (1 mg total) by mouth daily.  . [DISCONTINUED] perphenazine (TRILAFON) 8 MG tablet Take 3 tablets (24 mg total) by mouth at bedtime.     Substance Abuse History:  Denies any use of alcohol peers there is no history suggestive of substance abuse or alcoholism. Family History; Family History  Problem Relation Age of Onset  . Diabetes Father   . Heart disease Father   . Diabetes Brother        Labs:  No results found for this or any previous visit (from the past 2160 hour(s)).     Musculoskeletal: Strength & Muscle Tone: within normal limits Gait & Station: normal Patient leans: N/A  Mental Status Examination;   Psychiatric Specialty Exam: Physical Exam  Constitutional: He appears well-developed.  HENT:  Head: Normocephalic and atraumatic.  Skin: He is not diaphoretic.    Review of Systems  Cardiovascular: Negative for chest pain.  Musculoskeletal: Positive for myalgias.  Neurological: Positive for tremors. Negative for headaches.  Psychiatric/Behavioral: Negative for depression and substance abuse. The patient is not nervous/anxious.     Blood pressure 126/78, pulse 78, height 5\' 11"  (1.803 m), weight 270 lb (122.471 kg).Body mass index is 37.67 kg/(m^2).  General Appearance: Casual, obese  Eye Contact::  Fair  Speech:  Slow  Volume:  Normal  Mood:  Euthymic  Affect:  Congruent  Thought Process:  Coherent and Linear  Orientation:  Full (Time, Place, and Person)  Thought Content:  Rumination   Suicidal Thoughts:  No  Homicidal Thoughts:  No  Memory:  Immediate;   Fair Recent;   Fair  Judgement:  Fair  Insight:  Fair  Psychomotor Activity:  Normal  Concentration:  Fair  Recall:  Fair  Akathisia:  Negative  Handed:  Right  AIMS (if indicated):     Assets:  Desire for Improvement Financial Resources/Insurance Vocational/Educational  Sleep:        Assessment: Axis I: Major depressive disorder, recurrent moderate (stable on meds). R/o Schizoaffective disorder (as per history -suggestive of paranoia and obsessive toughts of injuring people in remote past), OCD per history (Nardil has stablized these symptoms)  Axis II: deffered  Axis III:  Past Medical History  Diagnosis Date  . Thyroid disease   . Hypertension   . Seizures   . Depression   . Arrhythmia   . Osteoarthritis     oa in bilateral knees  . Morbid obesity, BMI unknown   . Low back pain     Axis IV: psyosocial. Medical. Single   Treatment Plan and Summary: He understands Nardil is a MAOi oxidase inhibitor. States that he has  been on this medication for 35 years he understands all the risks including hypertension crisis including worsening of depression including the risk associated with heart problems. States his cardiologist is also aware  that he is on medication. Patient wants to continue current dose despite understanding the side dose but he believes if the dose is lowered he may go back into depression or his OCD symptoms and says that he has been on this medication dose and this is the only dose that helps.  Continue Nardil 45 mg twice a day. He has refills.   Continue Trilafon 8 mg 3 tablets at night for his obsessive-compulsive traits including paranoia and intrusive thoughts that become problematic when he is not taking it. Refill given  Continue Cogentin 1 mg for extrapyramidal side effects. Refill given today.  Continue Depakote 500 mg. Says that it has been helping the temporal lobe  seizure. He has enough since he has prior bottles.  He is requesting transfer to [facility] clinic or other nearby clinic where he lives. Has enough refills  Pertinent Labs and Relevant Prior Notes reviewed. Medication Side effects, benefits and risks reviewed/discussed with Patient. Time given for patient to respond and asks questions regarding the Diagnosis and Medications. Safety concerns and to report to ER if suicidal or call 911. Relevant Medications refilled or called in to pharmacy. Discussed weight maintenance and Sleep Hygiene. Follow up with Primary care provider in regards to Medical conditions. Recommend compliance with medications and follow up office appointments. Discussed to avail opportunity to consider or/and continue Individual therapy with Counselor. Greater than 50% of time was spend in counseling and coordination of care with the patient.  Schedule for Follow up or transfer to [facility] clinic.   Merian Capron, MD 10/23/2014

## 2014-12-11 ENCOUNTER — Other Ambulatory Visit (HOSPITAL_COMMUNITY): Payer: Self-pay | Admitting: Psychiatry

## 2014-12-13 ENCOUNTER — Telehealth (HOSPITAL_COMMUNITY): Payer: Self-pay | Admitting: *Deleted

## 2014-12-13 MED ORDER — PHENELZINE SULFATE 15 MG PO TABS: 45.0000 mg | ORAL_TABLET | Freq: Two times a day (BID) | ORAL | Status: DC

## 2014-12-13 NOTE — Telephone Encounter (Signed)
Received fax from  [place] for a refill for Phenelzine Sulfate 15mg . Per Dr. De Nurse, pt is authorized for a refill for Phenelzine Sulfate 15mg , Qty 180. Prescription was sent to pharmacy. Pt has a f/u appt w/ Dr. Marchia Bond 7/26. Called and informed pt of prescription status. Pt verbalizes understanding.

## 2014-12-17 NOTE — Telephone Encounter (Signed)
Rx for Nardil was sent pharmacy on 6/9.

## 2015-01-08 ENCOUNTER — Telehealth (HOSPITAL_COMMUNITY): Payer: Self-pay | Admitting: Psychiatry

## 2015-01-09 NOTE — Telephone Encounter (Signed)
Received fax from [facility] for a refill for Nardil 15mg . Per Dr. De Nurse, pt is authorized for Nardil 15mg , Qty 180.  Prescription was sent to pharmacy. Pt has a f/u appt on 7/26. Called and informed pt of prescription status.  Pt verbalizes understanding.

## 2015-01-29 ENCOUNTER — Ambulatory Visit (HOSPITAL_COMMUNITY): Payer: Self-pay | Admitting: Psychiatry

## 2015-01-30 NOTE — Telephone Encounter (Signed)
PT needs his other scripts filled. He was not able to see Dr. Adele Schilder yesterday. He is scheduled for Dr. Adele Schilder in August. Please refill his other 2 scripts. Depakote & Trilafon. Pharmacy on file.

## 2015-01-31 MED ORDER — DIVALPROEX SODIUM ER 500 MG PO TB24: 500.0000 mg | ORAL_TABLET | Freq: Every day | ORAL | Status: DC

## 2015-01-31 MED ORDER — PERPHENAZINE 8 MG PO TABS: 24.0000 mg | ORAL_TABLET | Freq: Every day | ORAL | Status: DC

## 2015-01-31 NOTE — Telephone Encounter (Signed)
PT called for a refill for Depakote 500mg  and Trilafon 8mg . Per Dr. De Nurse, pt is authorized for a refill for Depakote 500mg , Qty 30 and Trilafon 8mg , Qty 90. Prescription was sent to [facility]) Pt has a f/u appt w/ Dr. Marchia Bond on 8/19. Called and informed pt of prescription status and appt. PT verbalizes understanding.

## 2015-02-08 ENCOUNTER — Telehealth (HOSPITAL_COMMUNITY): Payer: Self-pay | Admitting: *Deleted

## 2015-02-08 NOTE — Telephone Encounter (Signed)
Pharmacy request for a 90 day supply of [facility] in [place] [place]. Will get MD approval. Next appointment with Dr. Adele Schilder on 02/27/15. Transfer from [place] to [facility].

## 2015-02-09 ENCOUNTER — Other Ambulatory Visit (HOSPITAL_COMMUNITY): Payer: Self-pay | Admitting: Psychiatry

## 2015-02-13 NOTE — Telephone Encounter (Signed)
Received telephone call from [facility]. Per Dr. De Nurse, pt is authorized for a refill for Trilafon 8mg . Pt must keep appointment with Dr. Adele Schilder on 02/27/15. Per robin, pharmacy will notify pt when prescription is ready.

## 2015-02-14 NOTE — Telephone Encounter (Signed)
Prescription for Trilafon 8mg  was sent to [facility] on 01/31/15. Pt has a f/u appt w/ Dr. Marchia Bond on 02/27/15.

## 2015-02-27 ENCOUNTER — Ambulatory Visit (INDEPENDENT_AMBULATORY_CARE_PROVIDER_SITE_OTHER): Payer: Medicare Other | Admitting: Psychiatry

## 2015-02-27 ENCOUNTER — Encounter (HOSPITAL_COMMUNITY): Payer: Self-pay | Admitting: Psychiatry

## 2015-02-27 VITALS — BP 116/76 | HR 80 | Ht 69.5 in | Wt 276.0 lb

## 2015-02-27 DIAGNOSIS — F330 Major depressive disorder, recurrent, mild: Secondary | ICD-10-CM

## 2015-02-27 DIAGNOSIS — F331 Major depressive disorder, recurrent, moderate: Secondary | ICD-10-CM | POA: Diagnosis not present

## 2015-02-27 DIAGNOSIS — Z79899 Other long term (current) drug therapy: Secondary | ICD-10-CM

## 2015-02-27 MED ORDER — BENZTROPINE MESYLATE 1 MG PO TABS
1.0000 mg | ORAL_TABLET | Freq: Every day | ORAL | Status: DC
Start: 2015-02-27 — End: 2015-06-19

## 2015-02-27 MED ORDER — DIVALPROEX SODIUM ER 500 MG PO TB24: 500.0000 mg | ORAL_TABLET | Freq: Every day | ORAL | Status: DC

## 2015-02-27 MED ORDER — PHENELZINE SULFATE 15 MG PO TABS: ORAL_TABLET | ORAL | Status: DC

## 2015-02-27 MED ORDER — PERPHENAZINE 8 MG PO TABS: 24.0000 mg | ORAL_TABLET | Freq: Every day | ORAL | Status: DC

## 2015-02-27 NOTE — Progress Notes (Signed)
[facility] progress Note   MORAD TAL [id number] 54 y.o.  02/27/2015 11:08 AM  Chief Complaint:  I live in [place] I want a psychiatrist in this area.    History of Present Illness:  Leilan is a 54 year old Caucasian, unemployed single man who has long history of psychiatric illness.  He is taking his medication for more than 35 years.  He was seeing Dr De Nurse in [place] office however he like to switch physician because he lives in [place].  Patient has history of schizoaffective disorder, OCD and major depressive disorder.  Prior to that he was seeing psychiatrist at [facility] but he was not happy with them.  Patient told his current medicine is working very well for him.  Patient is well aware about his psycho topic medication including dietary restrictions with Nardil.  Patient told that he is not comfortable seeing younger doctors who does not have experience prescribing Nardil.  Patient reported that this medicine has worked for him for more than 35 years and he does not want to change or reduce the dose.  He is also taking Depakote which he believes he started initially for temporal lobe seizures but prescribed by psychiatrist more than 30 years ago.  He is also taking Cogentin and Trilafon.  In the past he had tried to cut it down these medication but causes significant decompensation into his psychiatric illness.  His last psychiatric hospitalization was in 1985 due to severe depression and negative thinking.  Patient do not recall any recent psychosis, hallucination, suicidal parts or homicidal thought.  He denies any crying spells, irritability, severe mood swing, feeling of hopelessness or worthlessness.  His energy level is good.  He denies any mania or aggressive behavior.  He has no tremors or shakes.  His primary care physician and cardiologist is at [facility] and is trying to move all his physician to [facility].  Patient lives by  himself.  He never mattered he has no children.  His father is deceased and his mother live out of the town but usually calms and stay with him.  Patient denies drinking or using any illegal substances.  Patient is not seeing any therapist and he is not interested in counseling at this time.  Past Psychiatric History/Hospitalization(s) Patient has multiple hospitalizations from [phone] .  He remember mostly he was admitted due to severe depression and having suicidal thoughts with negative thinking but denies any suicidal attempt.  He was diagnosed with major depressive disorder, schizoaffective disorder and OCD.  He admitted history of somewhat paranoia and intrusive thoughts in the past.  Since he is taking his current medication more than 35 years ago he is been pretty stable and is happy about it.  He had tried numerous psychotherapy medication mostly TCAs.  He followed outpatient services with a psychiatrist in [place] Dr. Danny Lawless, who retired couple of years ago so he started with [facility] outpatient clinic but is not satisfied and wants to change this provider.  His last psychiatrist was Dr. De Nurse in [place].  Hospitalization for psychiatric illness: Yes History of Electroconvulsive Shock Therapy: No Prior Suicide Attempts: No  Medical History; Past Medical History  Diagnosis Date  . Thyroid disease   . Hypertension   . Seizures   . Depression   . Arrhythmia   . Osteoarthritis     oa in bilateral knees  . Morbid obesity, BMI unknown   . Low back pain     Allergies: No  Known Allergies  Medications: Outpatient Encounter Prescriptions as of 02/27/2015  Medication Sig  . allopurinol (ZYLOPRIM) 300 MG tablet Take 300 mg by mouth daily.   Marland Kitchen apixaban (ELIQUIS) 5 MG TABS tablet Take 5 mg by mouth 2 (two) times daily.  . benztropine (COGENTIN) 1 MG tablet Take 1 tablet (1 mg total) by mouth daily.  . carvedilol (COREG) 3.125 MG tablet TAKE 1 TABLET (3.125 MG TOTAL) BY MOUTH  2 TIMES DAILY.  Marland Kitchen Cholecalciferol (VITAMIN D) 1000 UNITS capsule Take 1,000 Units by mouth daily.  Marland Kitchen desonide (DESOWEN) 0.05 % cream Apply 1 application topically 2 (two) times daily as needed (for dry skin).  Marland Kitchen divalproex (DEPAKOTE ER) 500 MG 24 hr tablet Take 1 tablet (500 mg total) by mouth daily.  . fish oil-omega-3 fatty acids 1000 MG capsule Take 1 g by mouth 2 (two) times daily.   Marland Kitchen levothyroxine (SYNTHROID, LEVOTHROID) 100 MCG tablet Take 100 mcg by mouth daily before breakfast.  . perphenazine (TRILAFON) 8 MG tablet Take 3 tablets (24 mg total) by mouth at bedtime.  . phenelzine (NARDIL) 15 MG tablet TAKE 3 TABLETS (45 MG TOTAL) BY MOUTH 2 (TWO) TIMES DAILY.  . silver sulfADIAZINE (SILVADENE) 1 % cream Apply 1 application topically daily as needed (for rash).  . [DISCONTINUED] benztropine (COGENTIN) 1 MG tablet Take 1 tablet (1 mg total) by mouth daily.  . [DISCONTINUED] divalproex (DEPAKOTE ER) 500 MG 24 hr tablet Take 1 tablet (500 mg total) by mouth daily.  . [DISCONTINUED] perphenazine (TRILAFON) 8 MG tablet Take 3 tablets (24 mg total) by mouth at bedtime.  . [DISCONTINUED] phenelzine (NARDIL) 15 MG tablet TAKE 3 TABLETS (45 MG TOTAL) BY MOUTH 2 (TWO) TIMES DAILY.   No facility-administered encounter medications on file as of 02/27/2015.     Substance Abuse History: Denies any use of alcohol peers there is no history suggestive of substance abuse or alcoholism.  Family History; Family History  Problem Relation Age of Onset  . Diabetes Father   . Heart disease Father   . Diabetes Brother        Labs:  No results found for this or any previous visit (from the past 2160 hour(s)).     Musculoskeletal: Strength & Muscle Tone: within normal limits Gait & Station: normal Patient leans: N/A  Mental Status Examination;   Psychiatric Specialty Exam: Physical Exam  Constitutional: He appears well-developed.  HENT:  Head: Normocephalic and atraumatic.  Skin: He is  not diaphoretic.    Review of Systems  Cardiovascular: Negative for chest pain and palpitations.  Gastrointestinal: Negative for heartburn.  Musculoskeletal: Positive for myalgias.  Skin: Negative for itching and rash.  Neurological: Negative for headaches.  Psychiatric/Behavioral: Negative for depression and substance abuse. The patient is not nervous/anxious.     Blood pressure 116/76, pulse 80, height 5' 9.5" (1.765 m), weight 276 lb (125.193 kg).Body mass index is 40.19 kg/(m^2).  General Appearance: Casual, obese  Eye Contact::  Fair  Speech:  Slow  Volume:  Normal  Mood:  Euthymic  Affect:  Congruent  Thought Process:  Coherent and Linear  Orientation:  Full (Time, Place, and Person)  Thought Content:  Rumination  Suicidal Thoughts:  No  Homicidal Thoughts:  No  Memory:  Immediate;   Fair Recent;   Fair  Judgement:  Fair  Insight:  Fair  Psychomotor Activity:  Normal  Concentration:  Fair  Recall:  Fair  Akathisia:  Negative  Handed:  Right  AIMS (if indicated):  Assets:  Desire for Improvement Financial Resources/Insurance Vocational/Educational  Sleep:        Assessment: Axis I: Major depressive disorder, recurrent moderate (stable on meds). R/o Schizoaffective disorder (as per history -suggestive of paranoia and obsessive toughts of injuring people in remote past), OCD per history (Nardil has stablized these symptoms)  Axis II: deffered  Axis III:  Past Medical History  Diagnosis Date  . Thyroid disease   . Hypertension   . Seizures   . Depression   . Arrhythmia   . Osteoarthritis     oa in bilateral knees  . Morbid obesity, BMI unknown   . Low back pain     Treatment Plan and Summary: I review his history, chart, psychosocial stressors, current medication and previous notes.  Patient is stable on his current psycho topic medication.  He is taking Nardil 45 mg twice a day, Depakote 500 mg at bedtime, Cogentin 1 mg daily and Trilafon 24 mg daily .   I had a long discussion with the patient about her psycho topic medication.  Patient does not want to change his dosage , reduce or try new medication.  At this time he does not have any side effects .  Patient is very well aware about dietary restrictions with Nardil.  He is not interested in counseling at this time.  Discuss safety concern especially anytime having active suicidal thoughts or homicidal thoughts then he need to call 911 or go to the local emergency room.  We will do Depakote level since he has not done in a while.  Patient is scheduled to see his cardiologist early next year .  Discuss compliance with medication and follow-up appointments.  Time spent 25 minutes.  More than 50% of the time spent in counseling, psychoeducation and coordination of care.  Patient was given enough time to ask questions about his medication, diagnoses and long-term prognosis.  I will see him again in 3 months.   Tammee Thielke T., MD 02/27/2015

## 2015-03-06 ENCOUNTER — Encounter (HOSPITAL_COMMUNITY): Payer: Self-pay | Admitting: Emergency Medicine

## 2015-03-06 ENCOUNTER — Emergency Department (HOSPITAL_COMMUNITY): Payer: Medicare Other

## 2015-03-06 ENCOUNTER — Emergency Department (HOSPITAL_COMMUNITY)
Admission: EM | Admit: 2015-03-06 | Discharge: 2015-03-06 | Disposition: A | Discharge status: Home / Self Care | Payer: Medicare Other | Attending: Emergency Medicine | Admitting: Emergency Medicine

## 2015-03-06 DIAGNOSIS — E079 Disorder of thyroid, unspecified: Secondary | ICD-10-CM | POA: Diagnosis not present

## 2015-03-06 DIAGNOSIS — F329 Major depressive disorder, single episode, unspecified: Secondary | ICD-10-CM | POA: Insufficient documentation

## 2015-03-06 DIAGNOSIS — R109 Unspecified abdominal pain: Secondary | ICD-10-CM | POA: Diagnosis not present

## 2015-03-06 DIAGNOSIS — Z8739 Personal history of other diseases of the musculoskeletal system and connective tissue: Secondary | ICD-10-CM | POA: Insufficient documentation

## 2015-03-06 DIAGNOSIS — Z79899 Other long term (current) drug therapy: Secondary | ICD-10-CM | POA: Diagnosis not present

## 2015-03-06 DIAGNOSIS — I10 Essential (primary) hypertension: Secondary | ICD-10-CM | POA: Diagnosis not present

## 2015-03-06 LAB — CBC WITH DIFFERENTIAL/PLATELET
Basophils Absolute: 0 10*3/uL (ref 0.0–0.1)
Basophils Relative: 0 % (ref 0–1)
Eosinophils Absolute: 0.9 10*3/uL — ABNORMAL HIGH (ref 0.0–0.7)
Eosinophils Relative: 9 % — ABNORMAL HIGH (ref 0–5)
HCT: 44.9 % (ref 39.0–52.0)
Hemoglobin: 15.1 g/dL (ref 13.0–17.0)
Lymphocytes Relative: 17 % (ref 12–46)
Lymphs Abs: 1.8 10*3/uL (ref 0.7–4.0)
MCH: 30.3 pg (ref 26.0–34.0)
MCHC: 33.6 g/dL (ref 30.0–36.0)
MCV: 90.2 fL (ref 78.0–100.0)
Monocytes Absolute: 1 10*3/uL (ref 0.1–1.0)
Monocytes Relative: 10 % (ref 3–12)
Neutro Abs: 6.8 10*3/uL (ref 1.7–7.7)
Neutrophils Relative %: 64 % (ref 43–77)
Platelets: 230 10*3/uL (ref 150–400)
RBC: 4.98 MIL/uL (ref 4.22–5.81)
RDW: 14.5 % (ref 11.5–15.5)
WBC: 10.6 10*3/uL — ABNORMAL HIGH (ref 4.0–10.5)

## 2015-03-06 LAB — URINALYSIS, ROUTINE W REFLEX MICROSCOPIC
Bilirubin Urine: NEGATIVE
Glucose, UA: NEGATIVE mg/dL
Hgb urine dipstick: NEGATIVE
Ketones, ur: NEGATIVE mg/dL
Leukocytes, UA: NEGATIVE
Nitrite: NEGATIVE
Protein, ur: NEGATIVE mg/dL
Specific Gravity, Urine: 1.028 (ref 1.005–1.030)
Urobilinogen, UA: 0.2 mg/dL (ref 0.0–1.0)
pH: 5 (ref 5.0–8.0)

## 2015-03-06 LAB — COMPREHENSIVE METABOLIC PANEL
ALT: 12 U/L — ABNORMAL LOW (ref 17–63)
AST: 28 U/L (ref 15–41)
Albumin: 3.6 g/dL (ref 3.5–5.0)
Alkaline Phosphatase: 110 U/L (ref 38–126)
Anion gap: 10 (ref 5–15)
BUN: 16 mg/dL (ref 6–20)
CO2: 26 mmol/L (ref 22–32)
Calcium: 9 mg/dL (ref 8.9–10.3)
Chloride: 100 mmol/L — ABNORMAL LOW (ref 101–111)
Creatinine, Ser: 0.99 mg/dL (ref 0.61–1.24)
GFR calc Af Amer: >60 mL/min (ref >60)
GFR calc non Af Amer: >60 mL/min (ref >60)
Glucose, Bld: 232 mg/dL — ABNORMAL HIGH (ref 65–99)
Potassium: 3.9 mmol/L (ref 3.5–5.1)
Sodium: 136 mmol/L (ref 135–145)
Total Bilirubin: 0.8 mg/dL (ref 0.3–1.2)
Total Protein: 6.6 g/dL (ref 6.5–8.1)

## 2015-03-06 MED ORDER — MORPHINE SULFATE (PF) 4 MG/ML IV SOLN
4.0000 mg | Freq: Once | INTRAVENOUS | Status: AC
  Administered 2015-03-06: 4 mg via INTRAVENOUS
  Filled 2015-03-06: qty 1

## 2015-03-06 MED ORDER — SODIUM CHLORIDE 0.9 % IV BOLUS (SEPSIS)
1000.0000 mL | Freq: Once | INTRAVENOUS | Status: AC
  Administered 2015-03-06: 1000 mL via INTRAVENOUS

## 2015-03-06 MED ORDER — ONDANSETRON HCL 4 MG/2ML IJ SOLN
4.0000 mg | Freq: Once | INTRAMUSCULAR | Status: AC
  Administered 2015-03-06: 4 mg via INTRAVENOUS
  Filled 2015-03-06: qty 2

## 2015-03-06 MED ORDER — OXYCODONE-ACETAMINOPHEN 5-325 MG PO TABS: 1.0000 | ORAL_TABLET | Freq: Four times a day (QID) | ORAL | Status: DC | PRN

## 2015-03-06 NOTE — ED Notes (Signed)
Pt states that he has had left sided flank pain for past 2-3 days. Pt stated that he has not had any difficulty urinating or blood in his urine, but stated that his urine has been pretty dark. Pt with no history of kidney stones.

## 2015-03-06 NOTE — Discharge Instructions (Signed)
You were seen today for pain in your left side. The cause of your pain is unknown at this time. There is no evidence of kidney stone or urinary tract infection. He may have strained a muscle. Monitor for worsening symptoms. Follow-up with your primary physician if symptoms worsen.  Flank Pain Flank pain refers to pain that is located on the side of the body between the upper abdomen and the back. The pain may occur over a short period of time (acute) or may be long-term or reoccurring (chronic). It may be mild or severe. Flank pain can be caused by many things. CAUSES  Some of the more common causes of flank pain include:  Muscle strains.   Muscle spasms.   A disease of your spine (vertebral disk disease).   A lung infection (pneumonia).   Fluid around your lungs (pulmonary edema).   A kidney infection.   Kidney stones.   A very painful skin rash caused by the chickenpox virus (shingles).   Gallbladder disease.  [facility] care will depend on the cause of your pain. In general,  Rest as directed by your caregiver.  Drink enough fluids to keep your urine clear or pale yellow.  Only take over-the-counter or prescription medicines as directed by your caregiver. Some medicines may help relieve the pain.  Tell your caregiver about any changes in your pain.  Follow up with your caregiver as directed. SEEK IMMEDIATE MEDICAL CARE IF:   Your pain is not controlled with medicine.   You have new or worsening symptoms.  Your pain increases.   You have abdominal pain.   You have shortness of breath.   You have persistent nausea or vomiting.   You have swelling in your abdomen.   You feel faint or pass out.   You have blood in your urine.  You have a fever or persistent symptoms for more than 2-3 days.  You have a fever and your symptoms suddenly get worse. MAKE SURE YOU:   Understand these instructions.  Will watch your  condition.  Will get help right away if you are not doing well or get worse. Document Released: 08/13/2005 Document Revised: 03/16/2012 Document Reviewed: 02/04/2012 [facility] Patient Information 2015 [place], [place]. This information is not intended to replace advice given to you by your health care provider. Make sure you discuss any questions you have with your health care provider.

## 2015-03-06 NOTE — ED Provider Notes (Signed)
CSN: [id number]     Arrival date & time 03/06/15  0310 History   This chart was scribed for Anthony Hacker, MD by Randa Evens, ED Scribe. This patient was seen in room A05C/A05C and the patient's care was started at 3:28 AM.    Chief Complaint  Patient presents with  . Flank Pain   The history is provided by the patient. No language interpreter was used.   HPI Comments: [name] is a 54 y.o. male who presents to the Emergency Department complaining of left sided flank pain onset 3 days prior. Pt rates the severity of his pain 10/10. Pt states that the pain started in his abdomen but has radiated into his left flank today. Pt states that he has tried extra strength tylenol with no relief. Pt doesn't report any alleviating or worsening factors. Pt denies hematuria, dysuria, vomiting or diarrhea. Pt denies any recent injury. Pt denies Hx of kidney stones or UTI's.     Past Medical History  Diagnosis Date  . Thyroid disease   . Hypertension   . Seizures   . Depression   . Arrhythmia   . Osteoarthritis     oa in bilateral knees  . Morbid obesity, BMI unknown   . Low back pain    Past Surgical History  Procedure Laterality Date  . Achilles tendon repair  approx. 2004    right foot  . Cataract extraction      bilateral  . Tooth extraction     Family History  Problem Relation Age of Onset  . Diabetes Father   . Heart disease Father   . Diabetes Brother    Social History  Substance Use Topics  . Smoking status: Never Smoker   . Smokeless tobacco: Never Used  . Alcohol Use: No    Review of Systems  Constitutional: Negative.   Respiratory: Negative.  Negative for chest tightness and shortness of breath.   Cardiovascular: Negative.  Negative for chest pain.  Gastrointestinal: Positive for nausea. Negative for vomiting, abdominal pain and diarrhea.  Genitourinary: Positive for flank pain. Negative for dysuria and hematuria.  Musculoskeletal: Negative for back  pain.  Neurological: Negative for headaches.  All other systems reviewed and are negative.     Allergies  Review of patient's allergies indicates no known allergies.  Home Medications   Prior to Admission medications   Medication Sig Start Date End Date Taking? Authorizing Provider  allopurinol (ZYLOPRIM) 300 MG tablet Take 300 mg by mouth daily.    Yes Historical Provider, MD  apixaban (ELIQUIS) 5 MG TABS tablet Take 5 mg by mouth 2 (two) times daily.   Yes Historical Provider, MD  benztropine (COGENTIN) 1 MG tablet Take 1 tablet (1 mg total) by mouth daily. 02/27/15  Yes Kathlee Nations, MD  divalproex (DEPAKOTE ER) 500 MG 24 hr tablet Take 1 tablet (500 mg total) by mouth daily. 02/27/15  Yes Kathlee Nations, MD  levothyroxine (SYNTHROID, LEVOTHROID) 100 MCG tablet Take 100 mcg by mouth daily before breakfast.   Yes Historical Provider, MD  perphenazine (TRILAFON) 8 MG tablet Take 3 tablets (24 mg total) by mouth at bedtime. 02/27/15  Yes Kathlee Nations, MD  phenelzine (NARDIL) 15 MG tablet TAKE 3 TABLETS (45 MG TOTAL) BY MOUTH 2 (TWO) TIMES DAILY. 02/27/15  Yes Kathlee Nations, MD  oxyCODONE-acetaminophen (PERCOCET/ROXICET) 5-325 MG per tablet Take 1-2 tablets by mouth every 6 (six) hours as needed for severe pain. 03/06/15  Anthony Hacker, MD   BP 148/98 mmHg  Pulse 91  Temp(Src) 98.3 F (36.8 C) (Oral)  Resp 16  Ht 5\' 10"  (1.778 m)  Wt 275 lb (124.739 kg)  BMI 39.46 kg/m2  SpO2 99%   Physical Exam  Constitutional: He is oriented to person, place, and time. He appears well-developed and well-nourished. No distress.  obese  HENT:  Head: Normocephalic and atraumatic.  Cardiovascular: Normal rate, regular rhythm and normal heart sounds.   No murmur heard. Pulmonary/Chest: Effort normal and breath sounds normal. No respiratory distress. He has no wheezes.  Abdominal: Soft. Bowel sounds are normal. There is no tenderness. There is no rebound.  Musculoskeletal: He exhibits no edema.   Tenderness to palpation over the left flank, no muscle spasm noted  Neurological: He is alert and oriented to person, place, and time.  Skin: Skin is warm and dry.  Psychiatric: He has a normal mood and affect.  Nursing note and vitals reviewed.   ED Course  Procedures (including critical care time) DIAGNOSTIC STUDIES: Oxygen Saturation is 96% on RA, normal by my interpretation.    COORDINATION OF CARE: 3:45 AM-Discussed treatment plan with pt at bedside and pt agreed to plan.     Labs Review Labs Reviewed  CBC WITH DIFFERENTIAL/PLATELET - Abnormal; Notable for the following:    WBC 10.6 (*)    Eosinophils Relative 9 (*)    Eosinophils Absolute 0.9 (*)    All other components within normal limits  COMPREHENSIVE METABOLIC PANEL - Abnormal; Notable for the following:    Chloride 100 (*)    Glucose, Bld 232 (*)    ALT 12 (*)    All other components within normal limits  URINALYSIS, ROUTINE W REFLEX MICROSCOPIC (NOT AT [facility])    Imaging Review Ct Renal Stone Study  03/06/2015   CLINICAL DATA:  Left-sided flank pain for 2-3 days.  EXAM: CT ABDOMEN AND PELVIS WITHOUT CONTRAST  TECHNIQUE: Multidetector CT imaging of the abdomen and pelvis was performed following the standard protocol without IV contrast.  COMPARISON:  None.  FINDINGS: There are no urinary calculi. There is no hydronephrosis or ureteral dilatation. There is a 4.7 cm cyst of the right renal upper pole.  There are normal unenhanced appearances of the liver, gallbladder, bile ducts, spleen, adrenals, pancreas and left kidney. The stomach, small bowel and colon are remarkable only for moderate uncomplicated colonic diverticulosis. The appendix is normal. The abdominal aorta is normal in caliber. There is no atherosclerotic calcification. There is no adenopathy in the abdomen or pelvis. Minimal linear basilar scarring or atelectasis is present in both lungs. No acute inflammatory changes are evident in the abdomen or pelvis.  There is no ascites. There is no significant musculoskeletal lesion.  IMPRESSION: No acute findings.  Moderate diverticulosis.   Electronically Signed   By: Andreas Newport M.D.   On: 03/06/2015 04:27   I have personally reviewed and evaluated these images and lab results as part of my medical decision-making.   EKG Interpretation None      MDM   Final diagnoses:  Left flank pain    Patient presents with left flank pain. Nontoxic on exam. Pain is reproducible on exam. Otherwise exam is unremarkable. Lab work and urinalysis obtained and reassuring. CT stone study obtained and negative. Patient denies further symptoms including fever, vomiting or diarrhea. Pain could be musculoskeletal in nature. Discussed results with the patient. He will be given a short course of pain medication. He is  to follow-up with his primary physician if his symptoms do not improve.  After history, exam, and medical workup I feel the patient has been appropriately medically screened and is safe for discharge home. Pertinent diagnoses were discussed with the patient. Patient was given return precautions.  I personally performed the services described in this documentation, which was scribed in my presence. The recorded information has been reviewed and is accurate.      Anthony Hacker, MD 03/07/15 [phone]

## 2015-03-11 ENCOUNTER — Emergency Department (HOSPITAL_COMMUNITY)
Admission: EM | Admit: 2015-03-11 | Discharge: 2015-03-11 | Disposition: A | Discharge status: Home / Self Care | Payer: Medicare Other | Attending: Emergency Medicine | Admitting: Emergency Medicine

## 2015-03-11 ENCOUNTER — Encounter (HOSPITAL_COMMUNITY): Payer: Self-pay | Admitting: Emergency Medicine

## 2015-03-11 DIAGNOSIS — F329 Major depressive disorder, single episode, unspecified: Secondary | ICD-10-CM | POA: Insufficient documentation

## 2015-03-11 DIAGNOSIS — Z9842 Cataract extraction status, left eye: Secondary | ICD-10-CM | POA: Insufficient documentation

## 2015-03-11 DIAGNOSIS — Z9841 Cataract extraction status, right eye: Secondary | ICD-10-CM | POA: Diagnosis not present

## 2015-03-11 DIAGNOSIS — Z79899 Other long term (current) drug therapy: Secondary | ICD-10-CM | POA: Diagnosis not present

## 2015-03-11 DIAGNOSIS — R739 Hyperglycemia, unspecified: Secondary | ICD-10-CM | POA: Insufficient documentation

## 2015-03-11 DIAGNOSIS — I10 Essential (primary) hypertension: Secondary | ICD-10-CM | POA: Diagnosis not present

## 2015-03-11 DIAGNOSIS — G40909 Epilepsy, unspecified, not intractable, without status epilepticus: Secondary | ICD-10-CM | POA: Diagnosis not present

## 2015-03-11 DIAGNOSIS — E079 Disorder of thyroid, unspecified: Secondary | ICD-10-CM | POA: Diagnosis not present

## 2015-03-11 DIAGNOSIS — M1990 Unspecified osteoarthritis, unspecified site: Secondary | ICD-10-CM | POA: Insufficient documentation

## 2015-03-11 DIAGNOSIS — E860 Dehydration: Secondary | ICD-10-CM | POA: Diagnosis not present

## 2015-03-11 DIAGNOSIS — M545 Low back pain: Secondary | ICD-10-CM | POA: Insufficient documentation

## 2015-03-11 LAB — URINALYSIS, ROUTINE W REFLEX MICROSCOPIC
Glucose, UA: NEGATIVE mg/dL
Hgb urine dipstick: NEGATIVE
Ketones, ur: NEGATIVE mg/dL
Leukocytes, UA: NEGATIVE
Nitrite: NEGATIVE
Protein, ur: NEGATIVE mg/dL
Specific Gravity, Urine: 1.037 — ABNORMAL HIGH (ref 1.005–1.030)
Urobilinogen, UA: 0.2 mg/dL (ref 0.0–1.0)
pH: 5 (ref 5.0–8.0)

## 2015-03-11 LAB — CBC
HCT: 46.7 % (ref 39.0–52.0)
Hemoglobin: 15.6 g/dL (ref 13.0–17.0)
MCH: 30 pg (ref 26.0–34.0)
MCHC: 33.4 g/dL (ref 30.0–36.0)
MCV: 89.8 fL (ref 78.0–100.0)
Platelets: 202 10*3/uL (ref 150–400)
RBC: 5.2 MIL/uL (ref 4.22–5.81)
RDW: 14.1 % (ref 11.5–15.5)
WBC: 10.5 10*3/uL (ref 4.0–10.5)

## 2015-03-11 LAB — COMPREHENSIVE METABOLIC PANEL
ALT: 13 U/L — ABNORMAL LOW (ref 17–63)
AST: 21 U/L (ref 15–41)
Albumin: 3.8 g/dL (ref 3.5–5.0)
Alkaline Phosphatase: 93 U/L (ref 38–126)
Anion gap: 11 (ref 5–15)
BUN: 20 mg/dL (ref 6–20)
CO2: 26 mmol/L (ref 22–32)
Calcium: 9.1 mg/dL (ref 8.9–10.3)
Chloride: 102 mmol/L (ref 101–111)
Creatinine, Ser: 0.94 mg/dL (ref 0.61–1.24)
GFR calc Af Amer: >60 mL/min (ref >60)
GFR calc non Af Amer: >60 mL/min (ref >60)
Glucose, Bld: 167 mg/dL — ABNORMAL HIGH (ref 65–99)
Potassium: 3.9 mmol/L (ref 3.5–5.1)
Sodium: 139 mmol/L (ref 135–145)
Total Bilirubin: 0.5 mg/dL (ref 0.3–1.2)
Total Protein: 6.8 g/dL (ref 6.5–8.1)

## 2015-03-11 MED ORDER — OXYCODONE-ACETAMINOPHEN 5-325 MG PO TABS: 1.0000 | ORAL_TABLET | ORAL | Status: DC | PRN

## 2015-03-11 NOTE — Discharge Instructions (Signed)
Use the resource guide to help you find a doctor.    Back Pain, Adult Low back pain is very common. About 1 in 5 people have back pain.The cause of low back pain is rarely dangerous. The pain often gets better over time.About half of people with a sudden onset of back pain feel better in just 2 weeks. About 8 in 10 people feel better by 6 weeks.  CAUSES Some common causes of back pain include:  Strain of the muscles or ligaments supporting the spine.  Wear and tear (degeneration) of the spinal discs.  Arthritis.  Direct injury to the back. DIAGNOSIS Most of the time, the direct cause of low back pain is not known.However, back pain can be treated effectively even when the exact cause of the pain is unknown.Answering your caregiver's questions about your overall health and symptoms is one of the most accurate ways to make sure the cause of your pain is not dangerous. If your caregiver needs more information, he or she may order lab work or imaging tests (X-rays or MRIs).However, even if imaging tests show changes in your back, this usually does not require surgery. HOME CARE INSTRUCTIONS For many people, back pain returns.Since low back pain is rarely dangerous, it is often a condition that people can learn to [facility] their own.   Remain active. It is stressful on the back to sit or stand in one place. Do not sit, drive, or stand in one place for more than 30 minutes at a time. Take short walks on level surfaces as soon as pain allows.Try to increase the length of time you walk each day.  Do not stay in bed.Resting more than 1 or 2 days can delay your recovery.  Do not avoid exercise or work.Your body is made to move.It is not dangerous to be active, even though your back may hurt.Your back will likely heal faster if you return to being active before your pain is gone.  Pay attention to your body when you bend and lift. Many people have less discomfortwhen lifting if they  bend their knees, keep the load close to their bodies,and avoid twisting. Often, the most comfortable positions are those that put less stress on your recovering back.  Find a comfortable position to sleep. Use a firm mattress and lie on your side with your knees slightly bent. If you lie on your back, put a pillow under your knees.  Only take over-the-counter or prescription medicines as directed by your caregiver. Over-the-counter medicines to reduce pain and inflammation are often the most helpful.Your caregiver may prescribe muscle relaxant drugs.These medicines help dull your pain so you can more quickly return to your normal activities and healthy exercise.  Put ice on the injured area.  Put ice in a plastic bag.  Place a towel between your skin and the bag.  Leave the ice on for 15-20 minutes, 03-04 times a day for the first 2 to 3 days. After that, ice and heat may be alternated to reduce pain and spasms.  Ask your caregiver about trying back exercises and gentle massage. This may be of some benefit.  Avoid feeling anxious or stressed.Stress increases muscle tension and can worsen back pain.It is important to recognize when you are anxious or stressed and learn ways to manage it.Exercise is a great option. SEEK MEDICAL CARE IF:  You have pain that is not relieved with rest or medicine.  You have pain that does not improve in 1 week.  You have new symptoms.  You are generally not feeling well. SEEK IMMEDIATE MEDICAL CARE IF:   You have pain that radiates from your back into your legs.  You develop new bowel or bladder control problems.  You have unusual weakness or numbness in your arms or legs.  You develop nausea or vomiting.  You develop abdominal pain.  You feel faint. Document Released: 06/22/2005 Document Revised: 12/22/2011 Document Reviewed: 10/24/2013 [facility] Patient Information 2015 [place], [place]. This information is not intended to replace advice  given to you by your health care provider. Make sure you discuss any questions you have with your health care provider.  Dehydration, Adult Dehydration is when you lose more fluids from the body than you take in. Vital organs like the kidneys, brain, and heart cannot function without a proper amount of fluids and salt. Any loss of fluids from the body can cause dehydration.  CAUSES   Vomiting.  Diarrhea.  Excessive sweating.  Excessive urine output.  Fever. SYMPTOMS  Mild dehydration  Thirst.  Dry lips.  Slightly dry mouth. Moderate dehydration  Very dry mouth.  Sunken eyes.  Skin does not bounce back quickly when lightly pinched and released.  Dark urine and decreased urine production.  Decreased tear production.  Headache. Severe dehydration  Very dry mouth.  Extreme thirst.  Rapid, weak pulse (more than 100 beats per minute at rest).  Cold hands and feet.  Not able to sweat in spite of heat and temperature.  Rapid breathing.  Blue lips.  Confusion and lethargy.  Difficulty being awakened.  Minimal urine production.  No tears. DIAGNOSIS  Your caregiver will diagnose dehydration based on your symptoms and your exam. Blood and urine tests will help confirm the diagnosis. The diagnostic evaluation should also identify the cause of dehydration. TREATMENT  Treatment of mild or moderate dehydration can often be done at home by increasing the amount of fluids that you drink. It is best to drink small amounts of fluid more often. Drinking too much at one time can make vomiting worse. Refer to the home care instructions below. Severe dehydration needs to be treated at the hospital where you will probably be given intravenous (IV) fluids that contain water and electrolytes. HOME CARE INSTRUCTIONS   Ask your caregiver about specific rehydration instructions.  Drink enough fluids to keep your urine clear or pale yellow.  Drink small amounts frequently if  you have nausea and vomiting.  Eat as you normally do.  Avoid:  Foods or drinks high in sugar.  Carbonated drinks.  Juice.  Extremely hot or cold fluids.  Drinks with caffeine.  Fatty, greasy foods.  Alcohol.  Tobacco.  Overeating.  Gelatin desserts.  Wash your hands well to avoid spreading bacteria and viruses.  Only take over-the-counter or prescription medicines for pain, discomfort, or fever as directed by your caregiver.  Ask your caregiver if you should continue all prescribed and over-the-counter medicines.  Keep all follow-up appointments with your caregiver. SEEK MEDICAL CARE IF:  You have abdominal pain and it increases or stays in one area (localizes).  You have a rash, stiff neck, or severe headache.  You are irritable, sleepy, or difficult to awaken.  You are weak, dizzy, or extremely thirsty. SEEK IMMEDIATE MEDICAL CARE IF:   You are unable to keep fluids down or you get worse despite treatment.  You have frequent episodes of vomiting or diarrhea.  You have blood or green matter (bile) in your vomit.  You have blood  in your stool or your stool looks black and tarry.  You have not urinated in 6 to 8 hours, or you have only urinated a small amount of very dark urine.  You have a fever.  You faint. MAKE SURE YOU:   Understand these instructions.  Will watch your condition.  Will get help right away if you are not doing well or get worse. Document Released: 06/22/2005 Document Revised: 09/14/2011 Document Reviewed: 02/09/2011 [facility] Patient Information 2015 [place], [place]. This information is not intended to replace advice given to you by your health care provider. Make sure you discuss any questions you have with your health care provider.  Hyperglycemia Hyperglycemia occurs when the glucose (sugar) in your blood is too high. Hyperglycemia can happen for many reasons, but it most often happens to people who do not know they have  diabetes or are not managing their diabetes properly.  CAUSES  Whether you have diabetes or not, there are other causes of hyperglycemia. Hyperglycemia can occur when you have diabetes, but it can also occur in other situations that you might not be as aware of, such as: Diabetes  If you have diabetes and are having problems controlling your blood glucose, hyperglycemia could occur because of some of the following reasons:  Not following your meal plan.  Not taking your diabetes medications or not taking it properly.  Exercising less or doing less activity than you normally do.  Being sick. Pre-diabetes  This cannot be ignored. Before people develop Type 2 diabetes, they almost always have "pre-diabetes." This is when your blood glucose levels are higher than normal, but not yet high enough to be diagnosed as diabetes. Research has shown that some long-term damage to the body, especially the heart and circulatory system, may already be occurring during pre-diabetes. If you take action to manage your blood glucose when you have pre-diabetes, you may delay or prevent Type 2 diabetes from developing. Stress  If you have diabetes, you may be "diet" controlled or on oral medications or insulin to control your diabetes. However, you may find that your blood glucose is higher than usual in the hospital whether you have diabetes or not. This is often referred to as "stress hyperglycemia." Stress can elevate your blood glucose. This happens because of hormones put out by the body during times of stress. If stress has been the cause of your high blood glucose, it can be followed regularly by your caregiver. That way he/she can make sure your hyperglycemia does not continue to get worse or progress to diabetes. Steroids  Steroids are medications that act on the infection fighting system (immune system) to block inflammation or infection. One side effect can be a rise in blood glucose. Most people can  produce enough extra insulin to allow for this rise, but for those who cannot, steroids make blood glucose levels go even higher. It is not unusual for steroid treatments to "uncover" diabetes that is developing. It is not always possible to determine if the hyperglycemia will go away after the steroids are stopped. A special blood test called an A1c is sometimes done to determine if your blood glucose was elevated before the steroids were started. SYMPTOMS  Thirsty.  Frequent urination.  Dry mouth.  Blurred vision.  Tired or fatigue.  Weakness.  Sleepy.  Tingling in feet or leg. DIAGNOSIS  Diagnosis is made by monitoring blood glucose in one or all of the following ways:  A1c test. This is a chemical found in  your blood.  Fingerstick blood glucose monitoring.  Laboratory results. TREATMENT  First, knowing the cause of the hyperglycemia is important before the hyperglycemia can be treated. Treatment may include, but is not be limited to:  Education.  Change or adjustment in medications.  Change or adjustment in meal plan.  Treatment for an illness, infection, etc.  More frequent blood glucose monitoring.  Change in exercise plan.  Decreasing or stopping steroids.  Lifestyle changes. HOME CARE INSTRUCTIONS   Test your blood glucose as directed.  Exercise regularly. Your caregiver will give you instructions about exercise. Pre-diabetes or diabetes which comes on with stress is helped by exercising.  Eat wholesome, balanced meals. Eat often and at regular, fixed times. Your caregiver or nutritionist will give you a meal plan to guide your sugar intake.  Being at an ideal weight is important. If needed, losing as little as 10 to 15 pounds may help improve blood glucose levels. SEEK MEDICAL CARE IF:   You have questions about medicine, activity, or diet.  You continue to have symptoms (problems such as increased thirst, urination, or weight gain). SEEK IMMEDIATE  MEDICAL CARE IF:   You are vomiting or have diarrhea.  Your breath smells fruity.  You are breathing faster or slower.  You are very sleepy or incoherent.  You have numbness, tingling, or pain in your feet or hands.  You have chest pain.  Your symptoms get worse even though you have been following your caregiver's orders.  If you have any other questions or concerns. Document Released: 12/16/2000 Document Revised: 09/14/2011 Document Reviewed: 10/19/2011 [facility] Patient Information 2015 [place], [place]. This information is not intended to replace advice given to you by your health care provider. Make sure you discuss any questions you have with your health care provider.   Emergency Department Resource Guide 1) Find a Doctor and Pay Out of Pocket Although you won't have to find out who is covered by your insurance plan, it is a good idea to ask around and get recommendations. You will then need to call the office and see if the doctor you have chosen will accept you as a new patient and what types of options they offer for patients who are self-pay. Some doctors offer discounts or will set up payment plans for their patients who do not have insurance, but you will need to ask so you aren't surprised when you get to your appointment.  2) Contact Your Local Health Department Not all health departments have doctors that can see patients for sick visits, but many do, so it is worth a call to see if yours does. If you don't know where your local health department is, you can check in your phone book. The CDC also has a tool to help you locate your state's health department, and many state websites also have listings of all of their local health departments.  3) Find a [facility] [facility] If your illness is not likely to be very severe or complicated, you may want to try a walk in clinic. These are popping up all over the country in pharmacies, drugstores, and shopping centers. They're usually  staffed by nurse practitioners or physician assistants that have been trained to treat common illnesses and complaints. They're usually fairly quick and inexpensive. However, if you have serious medical issues or chronic medical problems, these are probably not your best option.  No Primary Care Doctor: - Call Health Connect at  [phone] - they can help you locate a  primary care doctor that  accepts your insurance, provides certain services, etc. - Physician Referral Service- [phone]  Chronic Pain Problems: Organization         Address  Phone   Notes  [facility]  [phone] Patients need to be referred by their primary care doctor.   Medication Assistance: Organization         Address  Phone   Notes  [facility] Medication [facility] [address] [place], [place] [address] [phone] --Must be a resident of [facility] -- Must have NO insurance coverage whatsoever (no Medicaid/ Medicare, etc.) -- The pt. MUST have a primary care doctor that directs their care regularly and follows them in the community   MedAssist  [phone]   [employer]  [phone]    Agencies that provide inexpensive medical care: Organization         Address  Phone   Notes  [place]  [phone]   [facility]    [phone]   [facility] [place], [place] [address] [phone]   [place] [address] [place] [phone]   Planned Parenthood    [phone]   [facility]    [phone]   [place] and [address], [place] Phone:  [phone], Fax:  [phone] Hours of Operation:  9 am - 6 pm, M-F.  Also accepts Medicaid/Medicare and self-pay.  [facility] for [place], [address], [place] Phone: [phone], Fax: [phone]. Hours of  Operation:  8:30 am - 5:30 pm, M-F.  Also accepts Medicaid and self-pay.  [facility] [place] [address] [place] Phone: [phone]   [place], [place], [place] [phone], Ext. [phone] Mondays & Thursdays: 7-9 AM.  First 15 patients are seen on a first come, first serve basis.    [place] Providers:  Organization         Address  Phone   Notes  [facility] [address], [place] [phone] Also accepts self-pay patients.  [facility] [address], [place]  [phone]   [place], [address], [place] [phone]   [facility] [address] [place] [phone]   Lucianne Lei [address], [place]   [phone] Only accepts [place] Access [place] patients after they have their name applied to their card.   Self-Pay (no insurance) in [facility]:  Organization         Address  Phone   Notes  Sickle Cell Patients, [facility] [place] [phone]   [facility] [place] [phone]   [facility] [place]  [place], [place], [place] [phone]   [facility]/Dr. Osei-Bonsu  [address] [place] or [address], [place] [phone] Phone number for both [place] and [place] locations is the same.  [facility] [address], [place] [phone]   [facility] [address] [place] or [address] [phone]  [facility] [place] [phone], phone; [phone], fax Sees patients 1st and 3rd Saturday of every month.  Must not qualify for public or private insurance (i.e. Medicaid, Medicare,  [place] Health Choice, Veterans' Benefits)  Household income should be no more than 200% of the poverty level The clinic cannot treat you if you are pregnant or think you are pregnant  Sexually transmitted diseases are not treated at the clinic.    Dental Care: Organization         Address  Phone  Notes  [facility] Department of [facility] [place] [phone] Accepts children up to age 28 who are enrolled in [place] or [place]; pregnant women with a Medicaid card; and children who have applied for Medicaid or [place] Health Choice, but were declined, whose parents can pay a reduced fee at time of service.  [facility] Department of [facility]  [name], [place] [phone] Accepts children up to age 23 who are enrolled in [place] or [place]; pregnant women with a Medicaid card; and children who have applied for Medicaid or [place] Health Choice, but were declined, whose parents can pay a reduced fee at time of service.  [place] Adult Dental Access PROGRAM  [place] [phone] Patients are seen by appointment only. Walk-ins are not accepted. [place] will see patients 65 years of age and older. Monday - Tuesday (8am-5pm) Most Wednesdays (8:30-5pm) $30 per visit, cash only  [facility] Adult [facility] Access PROGRAM  [address], [facility] [phone] Patients are seen by appointment only. Walk-ins are not accepted. [place] will see patients 65 years of age and older. One Wednesday Evening (Monthly: Volunteer Based).  $30 per visit, cash only  [place]  [phone] for adults; Children under age 64, call Graduate Pediatric Dentistry at [phone]. Children aged 66-14, please call [phone] to request a pediatric application.  Dental services are provided in all areas of dental care including fillings, crowns and bridges,  complete and partial dentures, implants, gum treatment, root canals, and extractions. Preventive care is also provided. Treatment is provided to both adults and children. Patients are selected via a lottery and there is often a waiting list.   [facility] [address] [place]  [phone] [email or web address]   [facility] [address] [place], [place] [phone], Ext. [phone] Second and Fourth Thursday of each month, opens at 6:30 AM; Clinic ends at 9 AM.  Patients are seen on a first-come first-served basis, and a limited number are seen during each clinic.   [facility]  [address] Hillard Danker [place], [place] [phone]   Eligibility Requirements You must have lived in [place], [place], or [place] counties for at least the last three months.   You cannot be eligible for state or federal sponsored [employer], including [employer], [place], or [employer].   You generally cannot be eligible for healthcare insurance through your employer.    How to apply: Eligibility screenings are held every Tuesday and Wednesday afternoon from 1:00 pm until 4:00 pm. You do not need an appointment for the interview!  [facility] [address], [place], [place]   [place]  [place] Department  [place]  Department  [phone]    Behavioral Health Resources in the Community: Intensive Outpatient Programs Organization         Address  Phone  Notes  [place]. [address], [place], [place] [phone]   [facility] Outpatient [address], [place], [place]   ADS: Alcohol & Drug Svcs [address], [place], [place]   [place] [address]  [place], [place] or [phone]   Substance Abuse Resources Organization          Address  Phone  Notes  Alcohol and Drug Services  [phone]   [place]  [phone]   [facility] [place]   Chinita Pester  [phone]   Residential & Outpatient Substance Abuse Program  [phone]   Psychological Services Organization         Address  Phone  Notes  [facility] [place]  [place]  [phone]   [place] [address], [place] or [phone]    Mobile Crisis Teams Organization         Address  Phone  Notes  [facility], Mobile Crisis Care Unit  [phone]   [facility]  [address] [place], [place]   [place] Levels [address], [place] [phone]    Self-Help/Support Groups Organization         Address  Phone             Notes  [place]. of [place] - variety of support groups  [place] Call for more information  Narcotics Anonymous (NA), [facility] [address], [employer] [place]  2 meetings at this location   Special educational needs teacher         Address  Phone  Notes  ASAP Residential Treatment [place],    [place]  [phone]   [facility]  [address] [place] [address], [place], [place]   [place], [place] [phone] Admissions: 8am-3pm M-F  Incentives Substance [place] [address]    [place], [place] [phone]   [facility] [address] [place], [place], [place]   [facility] [address],  [place], [place]   [facility]ight Programs - Intensive Outpatient [address], [place], [place]   [facility] ([place].) [place].,  [place], [place] [phone] or [phone]   [facility] Treatment Services (RTS) [address], [place], [place] Accepts Medicaid  [facility] [place] [address]  [place] [phone] Substance Abuse/Addiction Treatment   [facility] Organization         Address  Phone  Notes  [facility]  [phone]   Domenic Schwab, PhD [address] Arlis Porta [place], [place]   [phone] or [phone]   [place], [place] [phone]   [place] [address] [place], [place] [phone] Insurance/Medicaid/sponsorship through [employer] and Families [address] [place]                                    [place], [place] [phone] Therapy/tele-psych/case  Youth  [facility] [place], [place] [phone]    Dr. Adele Schilder  [phone]   [facility] [place] Dept. 1) [address] [place] 2) [place] 3)  [place] [address], [place] [phone]  [phone]   [place] [phone] or [phone] (After Hours)

## 2015-03-11 NOTE — ED Notes (Signed)
Pt c/o flank and back pain x 7 days with dark urine, denies dysuria or urinary frequency. Seen in ED on 03/06/15 for the same, negative for renal calculi. Pt c/o worsening symptoms and new symptoms of nausea and diarrhea since last visit.

## 2015-03-11 NOTE — ED Provider Notes (Signed)
CSN: [id number]     Arrival date & time 03/11/15  1053 History   First MD Initiated Contact with Patient 03/11/15 1303     No chief complaint on file.    (Consider location/radiation/quality/duration/timing/severity/associated sxs/prior Treatment) HPI   [name] is a 54 y.o. male who is here for evaluation of left flank pain, and dark urine. He was seen for similar problems, 03/06/2015, at that time diagnosed with nonspecific pain, and treated as an outpatient with Percocet. He has since run out of the Percocet, which was helping but the pain has now returned. He denies dysuria, urinary incontinence or urinary frequency. There's been no constipation. He denies fever, chills, cough, shortness of breath or chest pain. No chronic back problems or prior back surgery. He has had some diarrhea yesterday and today. No blood in the stool. He has intermittent episodes of constipation and is not currently using a stool softener. There's been no bowel incontinence. There are no other known modifying factors.   Past Medical History  Diagnosis Date  . Thyroid disease   . Hypertension   . Seizures   . Depression   . Arrhythmia   . Osteoarthritis     oa in bilateral knees  . Morbid obesity, BMI unknown   . Low back pain    Past Surgical History  Procedure Laterality Date  . Achilles tendon repair  approx. 2004    right foot  . Cataract extraction      bilateral  . Tooth extraction     Family History  Problem Relation Age of Onset  . Diabetes Father   . Heart disease Father   . Diabetes Brother    Social History  Substance Use Topics  . Smoking status: Never Smoker   . Smokeless tobacco: Never Used  . Alcohol Use: No    Review of Systems  All other systems reviewed and are negative.     Allergies  Review of patient's allergies indicates no known allergies.  Home Medications   Prior to Admission medications   Medication Sig Start Date End Date Taking? Authorizing  Provider  acetaminophen (TYLENOL) 500 MG tablet Take 1,000 mg by mouth every 8 (eight) hours as needed for mild pain or moderate pain.   Yes Historical Provider, MD  allopurinol (ZYLOPRIM) 300 MG tablet Take 300 mg by mouth daily.    Yes Historical Provider, MD  apixaban (ELIQUIS) 5 MG TABS tablet Take 5 mg by mouth 2 (two) times daily.   Yes Historical Provider, MD  benztropine (COGENTIN) 1 MG tablet Take 1 tablet (1 mg total) by mouth daily. 02/27/15  Yes Kathlee Nations, MD  desonide (DESOWEN) 0.05 % cream Apply 1 application topically daily as needed (dry skin).   Yes Historical Provider, MD  divalproex (DEPAKOTE ER) 500 MG 24 hr tablet Take 1 tablet (500 mg total) by mouth daily. 02/27/15  Yes Kathlee Nations, MD  levothyroxine (SYNTHROID, LEVOTHROID) 100 MCG tablet Take 100 mcg by mouth at bedtime.    Yes Historical Provider, MD  perphenazine (TRILAFON) 8 MG tablet Take 3 tablets (24 mg total) by mouth at bedtime. 02/27/15  Yes Kathlee Nations, MD  phenelzine (NARDIL) 15 MG tablet TAKE 3 TABLETS (45 MG TOTAL) BY MOUTH 2 (TWO) TIMES DAILY. 02/27/15  Yes Kathlee Nations, MD  silver sulfADIAZINE (SILVADENE) 1 % cream Apply 1 application topically daily as needed (for rash).   Yes Historical Provider, MD  oxyCODONE-acetaminophen (PERCOCET) 5-325 MG per tablet Take  1 tablet by mouth every 4 (four) hours as needed for severe pain. 03/11/15   Daleen Bo, MD   BP 122/99 mmHg  Pulse 87  Temp(Src) 98.4 F (36.9 C) (Oral)  Resp 16  SpO2 100% Physical Exam  Constitutional: He is oriented to person, place, and time. He appears well-developed and well-nourished.  HENT:  Head: Normocephalic and atraumatic.  Right Ear: External ear normal.  Left Ear: External ear normal.  Eyes: Conjunctivae and EOM are normal. Pupils are equal, round, and reactive to light.  Neck: Normal range of motion and phonation normal. Neck supple.  Cardiovascular: Normal rate, regular rhythm and normal heart sounds.   Pulmonary/Chest:  Effort normal and breath sounds normal. He exhibits no bony tenderness.  Abdominal: Soft. There is no tenderness.  Musculoskeletal: Normal range of motion.  Mild bilateral lumbar tenderness, with fair range of motion of the lumbar spine without severe pain.  Neurological: He is alert and oriented to person, place, and time. No cranial nerve deficit or sensory deficit. He exhibits normal muscle tone. Coordination normal.  Skin: Skin is warm, dry and intact.  Psychiatric: He has a normal mood and affect. His behavior is normal. Judgment and thought content normal.  Nursing note and vitals reviewed.   ED Course  Procedures (including critical care time)  Medications - No data to display  Patient Vitals for the past 24 hrs:  BP Temp Temp src Pulse Resp SpO2  03/11/15 1421 122/99 mmHg - - 87 16 100 %  03/11/15 1300 134/94 mmHg - - 100 18 95 %  03/11/15 1108 130/87 mmHg 98.4 F (36.9 C) Oral 105 20 96 %   At discharge, findings discussed with the patient and all questions were answered.   Labs Review Labs Reviewed  COMPREHENSIVE METABOLIC PANEL - Abnormal; Notable for the following:    Glucose, Bld 167 (*)    ALT 13 (*)    All other components within normal limits  URINALYSIS, ROUTINE W REFLEX MICROSCOPIC (NOT AT [facility]) - Abnormal; Notable for the following:    Color, Urine AMBER (*)    Specific Gravity, Urine 1.037 (*)    Bilirubin Urine SMALL (*)    All other components within normal limits  CBC    Imaging Review No results found. I have personally reviewed and evaluated these images and lab results as part of my medical decision-making.   EKG Interpretation None      MDM   Final diagnoses:  Low back pain, unspecified back pain laterality, with sciatica presence unspecified  Dehydration  Hyperglycemia    Nonspecific back pain without evidence for acute infectious or metabolic process. Dehydration is likely related to ongoing hyperglycemia which may be nutritionally  based. No evidence for ketosis, or serious bacterial infection.  Nursing Notes Reviewed/ Care Coordinated Applicable Imaging Reviewed Interpretation of Laboratory Data incorporated into ED treatment  The patient appears reasonably screened and/or stabilized for discharge and I doubt any other medical condition or other [facility] requiring further screening, evaluation, or treatment in the ED at this time prior to discharge.  Plan: Home Medications- Percocet; Home Treatments- rest; return here if the recommended treatment, does not improve the symptoms; Recommended follow up- PCP 1 week, CBG check then     Daleen Bo, MD 03/11/15 [phone]

## 2015-03-18 ENCOUNTER — Encounter (HOSPITAL_BASED_OUTPATIENT_CLINIC_OR_DEPARTMENT_OTHER): Payer: Self-pay | Admitting: Emergency Medicine

## 2015-03-18 ENCOUNTER — Emergency Department (HOSPITAL_BASED_OUTPATIENT_CLINIC_OR_DEPARTMENT_OTHER)
Admission: EM | Admit: 2015-03-18 | Discharge: 2015-03-18 | Disposition: A | Discharge status: Home / Self Care | Payer: Medicare Other | Attending: Emergency Medicine | Admitting: Emergency Medicine

## 2015-03-18 DIAGNOSIS — Z8659 Personal history of other mental and behavioral disorders: Secondary | ICD-10-CM | POA: Insufficient documentation

## 2015-03-18 DIAGNOSIS — E079 Disorder of thyroid, unspecified: Secondary | ICD-10-CM | POA: Diagnosis not present

## 2015-03-18 DIAGNOSIS — I10 Essential (primary) hypertension: Secondary | ICD-10-CM | POA: Diagnosis not present

## 2015-03-18 DIAGNOSIS — M545 Low back pain, unspecified: Secondary | ICD-10-CM

## 2015-03-18 DIAGNOSIS — Z79899 Other long term (current) drug therapy: Secondary | ICD-10-CM | POA: Insufficient documentation

## 2015-03-18 DIAGNOSIS — R109 Unspecified abdominal pain: Secondary | ICD-10-CM | POA: Diagnosis present

## 2015-03-18 HISTORY — DX: Unspecified atrial fibrillation: I48.91

## 2015-03-18 LAB — URINALYSIS, ROUTINE W REFLEX MICROSCOPIC
Glucose, UA: 100 mg/dL — AB
Hgb urine dipstick: NEGATIVE
Ketones, ur: 15 mg/dL — AB
Leukocytes, UA: NEGATIVE
Nitrite: NEGATIVE
Protein, ur: NEGATIVE mg/dL
Specific Gravity, Urine: 1.036 — ABNORMAL HIGH (ref 1.005–1.030)
Urobilinogen, UA: 1 mg/dL (ref 0.0–1.0)
pH: 5 (ref 5.0–8.0)

## 2015-03-18 MED ORDER — METHOCARBAMOL 500 MG PO TABS: 500.0000 mg | ORAL_TABLET | Freq: Three times a day (TID) | ORAL | Status: DC | PRN

## 2015-03-18 NOTE — ED Notes (Signed)
Pt states he has had abdominal pain for weeks and has been seen multiple times for this without any diagnosis. Denies anything making it better or worse. Only other complaint is nausea. Denies vomiting, diarrhea, fever, or chills.

## 2015-03-18 NOTE — ED Provider Notes (Signed)
CSN: [id number]     Arrival date & time 03/18/15  0617 History   First MD Initiated Contact with Patient 03/18/15 [phone]     Chief Complaint  Patient presents with  . Abdominal Pain      HPI  She presents for evaluation of back pain. Seen on the 31st, and on the fifth. Had a negative CT stone protocol on the 31st. Has had urine without blood or signs of infection. Normal labs. His pain seems to be paraspinal. He indicates to me that hurts when I "do this" and he indicates that when he twists his trunk from side to side or goes from laying to sitting that it hurts him. He denies fall or injury or trauma. He did not have spinal abnormalities noted on CT of his abdomen. No nausea or vomiting. No fevers or chills. No pain with voiding or frequency. No additional symptoms.  Past Medical History  Diagnosis Date  . Thyroid disease   . Hypertension   . Seizures   . Depression   . Arrhythmia   . Osteoarthritis     oa in bilateral knees  . Morbid obesity, BMI unknown   . Low back pain   . A-fib    Past Surgical History  Procedure Laterality Date  . Achilles tendon repair  approx. 2004    right foot  . Cataract extraction      bilateral  . Tooth extraction     Family History  Problem Relation Age of Onset  . Diabetes Father   . Heart disease Father   . Diabetes Brother    Social History  Substance Use Topics  . Smoking status: Never Smoker   . Smokeless tobacco: Never Used  . Alcohol Use: No    Review of Systems  Constitutional: Negative for fever, chills, diaphoresis, appetite change and fatigue.  HENT: Negative for mouth sores, sore throat and trouble swallowing.   Eyes: Negative for visual disturbance.  Respiratory: Negative for cough, chest tightness, shortness of breath and wheezing.   Cardiovascular: Negative for chest pain.  Gastrointestinal: Negative for nausea, vomiting, abdominal pain, diarrhea and abdominal distention.  Endocrine: Negative for polydipsia,  polyphagia and polyuria.  Genitourinary: Negative for dysuria, frequency and hematuria.  Musculoskeletal: Positive for back pain. Negative for gait problem.  Skin: Negative for color change, pallor and rash.  Neurological: Negative for dizziness, syncope, light-headedness and headaches.  Hematological: Does not bruise/bleed easily.  Psychiatric/Behavioral: Negative for behavioral problems and confusion.      Allergies  Review of patient's allergies indicates no known allergies.  Home Medications   Prior to Admission medications   Medication Sig Start Date End Date Taking? Authorizing Provider  acetaminophen (TYLENOL) 500 MG tablet Take 1,000 mg by mouth every 8 (eight) hours as needed for mild pain or moderate pain.    Historical Provider, MD  allopurinol (ZYLOPRIM) 300 MG tablet Take 300 mg by mouth daily.     Historical Provider, MD  apixaban (ELIQUIS) 5 MG TABS tablet Take 5 mg by mouth 2 (two) times daily.    Historical Provider, MD  benztropine (COGENTIN) 1 MG tablet Take 1 tablet (1 mg total) by mouth daily. 02/27/15   Kathlee Nations, MD  desonide (DESOWEN) 0.05 % cream Apply 1 application topically daily as needed (dry skin).    Historical Provider, MD  divalproex (DEPAKOTE ER) 500 MG 24 hr tablet Take 1 tablet (500 mg total) by mouth daily. 02/27/15   Kathlee Nations, MD  levothyroxine (SYNTHROID, LEVOTHROID) 100 MCG tablet Take 100 mcg by mouth at bedtime.     Historical Provider, MD  methocarbamol (ROBAXIN) 500 MG tablet Take 1 tablet (500 mg total) by mouth 3 (three) times daily between meals as needed. 03/18/15   Tanna Furry, MD  oxyCODONE-acetaminophen (PERCOCET) 5-325 MG per tablet Take 1 tablet by mouth every 4 (four) hours as needed for severe pain. 03/11/15   Daleen Bo, MD  perphenazine (TRILAFON) 8 MG tablet Take 3 tablets (24 mg total) by mouth at bedtime. 02/27/15   Kathlee Nations, MD  phenelzine (NARDIL) 15 MG tablet TAKE 3 TABLETS (45 MG TOTAL) BY MOUTH 2 (TWO) TIMES DAILY.  02/27/15   Kathlee Nations, MD  silver sulfADIAZINE (SILVADENE) 1 % cream Apply 1 application topically daily as needed (for rash).    Historical Provider, MD   BP 132/84 mmHg  Pulse 85  Temp(Src) 99.1 F (37.3 C) (Oral)  Resp 19  Ht 5\' 10"  (1.778 m)  Wt 275 lb (124.739 kg)  BMI 39.46 kg/m2  SpO2 94% Physical Exam  Constitutional: He is oriented to person, place, and time. He appears well-developed and well-nourished. No distress.  HENT:  Head: Normocephalic.  Eyes: Conjunctivae are normal. Pupils are equal, round, and reactive to light. No scleral icterus.  Neck: Normal range of motion. Neck supple. No thyromegaly present.  Cardiovascular: Normal rate and regular rhythm.  Exam reveals no gallop and no friction rub.   No murmur heard. Pulmonary/Chest: Effort normal and breath sounds normal. No respiratory distress. He has no wheezes. He has no rales.  Abdominal: Soft. Bowel sounds are normal. He exhibits no distension. There is no tenderness. There is no rebound.    Musculoskeletal: Normal range of motion.       Back:  Neurological: He is alert and oriented to person, place, and time.  Skin: Skin is warm and dry. No rash noted.  Psychiatric: He has a normal mood and affect. His behavior is normal.    ED Course  Procedures (including critical care time) Labs Review Labs Reviewed  URINALYSIS, ROUTINE W REFLEX MICROSCOPIC (NOT AT [facility]) - Abnormal; Notable for the following:    Color, Urine AMBER (*)    Specific Gravity, Urine 1.036 (*)    Glucose, UA 100 (*)    Bilirubin Urine SMALL (*)    Ketones, ur 15 (*)    All other components within normal limits    Imaging Review No results found. I have personally reviewed and evaluated these images and lab results as part of my medical decision-making.   EKG Interpretation None      MDM   Final diagnoses:  Bilateral low back pain without sciatica    Tenderness in his back. Negative CT within the last 2 weeks. Negative  urine today. Think is appropriate for outpatient treatment with simple muscle relaxants and primary care follow-up. Encouraged him to seek out a new primary care physician as he states his "old" in [place] he isn't seeing any more because [place]. May also need physical therapy if his symptoms persist. I am aware that he is taking Nardil, a  monoamine oxidase inhibitor. No interactions noted between Robaxin and this medication.    Tanna Furry, MD 03/18/15 [phone]

## 2015-03-18 NOTE — Discharge Instructions (Signed)
Back Pain, Adult Low back pain is very common. About 1 in 5 people have back pain.The cause of low back pain is rarely dangerous. The pain often gets better over time.About half of people with a sudden onset of back pain feel better in just 2 weeks. About 8 in 10 people feel better by 6 weeks.  CAUSES Some common causes of back pain include:  Strain of the muscles or ligaments supporting the spine.  Wear and tear (degeneration) of the spinal discs.  Arthritis.  Direct injury to the back. DIAGNOSIS Most of the time, the direct cause of low back pain is not known.However, back pain can be treated effectively even when the exact cause of the pain is unknown.Answering your caregiver's questions about your overall health and symptoms is one of the most accurate ways to make sure the cause of your pain is not dangerous. If your caregiver needs more information, he or she may order lab work or imaging tests (X-rays or MRIs).However, even if imaging tests show changes in your back, this usually does not require surgery. HOME CARE INSTRUCTIONS For many people, back pain returns.Since low back pain is rarely dangerous, it is often a condition that people can learn to manageon their own.   Remain active. It is stressful on the back to sit or stand in one place. Do not sit, drive, or stand in one place for more than 30 minutes at a time. Take short walks on level surfaces as soon as pain allows.Try to increase the length of time you walk each day.  Do not stay in bed.Resting more than 1 or 2 days can delay your recovery.  Do not avoid exercise or work.Your body is made to move.It is not dangerous to be active, even though your back may hurt.Your back will likely heal faster if you return to being active before your pain is gone.  Pay attention to your body when you bend and lift. Many people have less discomfortwhen lifting if they bend their knees, keep the load close to their bodies,and  avoid twisting. Often, the most comfortable positions are those that put less stress on your recovering back.  Find a comfortable position to sleep. Use a firm mattress and lie on your side with your knees slightly bent. If you lie on your back, put a pillow under your knees.  Only take over-the-counter or prescription medicines as directed by your caregiver. Over-the-counter medicines to reduce pain and inflammation are often the most helpful.Your caregiver may prescribe muscle relaxant drugs.These medicines help dull your pain so you can more quickly return to your normal activities and healthy exercise.  Put ice on the injured area.  Put ice in a plastic bag.  Place a towel between your skin and the bag.  Leave the ice on for 15-20 minutes, 03-04 times a day for the first 2 to 3 days. After that, ice and heat may be alternated to reduce pain and spasms.  Ask your caregiver about trying back exercises and gentle massage. This may be of some benefit.  Avoid feeling anxious or stressed.Stress increases muscle tension and can worsen back pain.It is important to recognize when you are anxious or stressed and learn ways to manage it.Exercise is a great option. SEEK MEDICAL CARE IF:  You have pain that is not relieved with rest or medicine.  You have pain that does not improve in 1 week.  You have new symptoms.  You are generally not feeling well. SEEK   IMMEDIATE MEDICAL CARE IF:   You have pain that radiates from your back into your legs.  You develop new bowel or bladder control problems.  You have unusual weakness or numbness in your arms or legs.  You develop nausea or vomiting.  You develop abdominal pain.  You feel faint. Document Released: 06/22/2005 Document Revised: 12/22/2011 Document Reviewed: 10/24/2013 [employer] Patient Information 2015 [employer]. This information is not intended to replace advice given to you by your health care provider. Make sure you  discuss any questions you have with your health care provider.  

## 2015-03-22 ENCOUNTER — Telehealth (HOSPITAL_COMMUNITY): Payer: Self-pay

## 2015-03-22 NOTE — Telephone Encounter (Signed)
Medication management - Telephone call with pt after he left a message he was not doing as well with current depression and mood. Pt. reports he has little interest in things, not sleeping as well but no SI/HI.  Patient questions need for possible medication adjustment as has only been seen once by Dr.Arfeen or if should be scheduled earlier for an appointment.  Patient does not return until 05/28/15 and reports taking all current medication as prescribed.

## 2015-03-24 ENCOUNTER — Emergency Department (HOSPITAL_COMMUNITY)
Admission: EM | Admit: 2015-03-24 | Discharge: 2015-03-24 | Disposition: A | Discharge status: Home / Self Care | Payer: Medicare Other | Attending: Emergency Medicine | Admitting: Emergency Medicine

## 2015-03-24 ENCOUNTER — Emergency Department (HOSPITAL_COMMUNITY): Payer: Medicare Other

## 2015-03-24 ENCOUNTER — Encounter (HOSPITAL_COMMUNITY): Payer: Self-pay | Admitting: Emergency Medicine

## 2015-03-24 DIAGNOSIS — M1990 Unspecified osteoarthritis, unspecified site: Secondary | ICD-10-CM | POA: Diagnosis not present

## 2015-03-24 DIAGNOSIS — G40909 Epilepsy, unspecified, not intractable, without status epilepticus: Secondary | ICD-10-CM | POA: Diagnosis not present

## 2015-03-24 DIAGNOSIS — I10 Essential (primary) hypertension: Secondary | ICD-10-CM | POA: Insufficient documentation

## 2015-03-24 DIAGNOSIS — F329 Major depressive disorder, single episode, unspecified: Secondary | ICD-10-CM | POA: Insufficient documentation

## 2015-03-24 DIAGNOSIS — Z79899 Other long term (current) drug therapy: Secondary | ICD-10-CM | POA: Diagnosis not present

## 2015-03-24 DIAGNOSIS — I4891 Unspecified atrial fibrillation: Secondary | ICD-10-CM | POA: Insufficient documentation

## 2015-03-24 DIAGNOSIS — K5900 Constipation, unspecified: Secondary | ICD-10-CM | POA: Insufficient documentation

## 2015-03-24 DIAGNOSIS — E079 Disorder of thyroid, unspecified: Secondary | ICD-10-CM | POA: Diagnosis not present

## 2015-03-24 LAB — BASIC METABOLIC PANEL WITH GFR
Anion gap: 8 (ref 5–15)
BUN: 15 mg/dL (ref 6–20)
CO2: 29 mmol/L (ref 22–32)
Calcium: 8.9 mg/dL (ref 8.9–10.3)
Chloride: 99 mmol/L — ABNORMAL LOW (ref 101–111)
Creatinine, Ser: 0.75 mg/dL (ref 0.61–1.24)
GFR calc Af Amer: >60 mL/min
GFR calc non Af Amer: >60 mL/min
Glucose, Bld: 159 mg/dL — ABNORMAL HIGH (ref 65–99)
Potassium: 4.1 mmol/L (ref 3.5–5.1)
Sodium: 136 mmol/L (ref 135–145)

## 2015-03-24 LAB — CBC WITH DIFFERENTIAL/PLATELET
Basophils Absolute: 0 K/uL (ref 0.0–0.1)
Basophils Relative: 1 %
Eosinophils Absolute: 0.3 K/uL (ref 0.0–0.7)
Eosinophils Relative: 5 %
HCT: 44 % (ref 39.0–52.0)
Hemoglobin: 14.4 g/dL (ref 13.0–17.0)
Lymphocytes Relative: 27 %
Lymphs Abs: 1.6 K/uL (ref 0.7–4.0)
MCH: 29.6 pg (ref 26.0–34.0)
MCHC: 32.7 g/dL (ref 30.0–36.0)
MCV: 90.5 fL (ref 78.0–100.0)
Monocytes Absolute: 1 K/uL (ref 0.1–1.0)
Monocytes Relative: 18 %
Neutro Abs: 3 K/uL (ref 1.7–7.7)
Neutrophils Relative %: 49 %
Platelets: 200 K/uL (ref 150–400)
RBC: 4.86 MIL/uL (ref 4.22–5.81)
RDW: 14.3 % (ref 11.5–15.5)
WBC: 5.9 K/uL (ref 4.0–10.5)

## 2015-03-24 LAB — URINALYSIS, ROUTINE W REFLEX MICROSCOPIC
Bilirubin Urine: NEGATIVE
Glucose, UA: NEGATIVE mg/dL
Hgb urine dipstick: NEGATIVE
Ketones, ur: NEGATIVE mg/dL
Leukocytes, UA: NEGATIVE
Nitrite: NEGATIVE
Protein, ur: NEGATIVE mg/dL
Specific Gravity, Urine: 1.02 (ref 1.005–1.030)
Urobilinogen, UA: 0.2 mg/dL (ref 0.0–1.0)
pH: 6.5 (ref 5.0–8.0)

## 2015-03-24 MED ORDER — POLYETHYLENE GLYCOL 3350 17 GM/SCOOP PO POWD: 17.0000 g | Freq: Two times a day (BID) | ORAL | Status: DC

## 2015-03-24 MED ORDER — MAGNESIUM CITRATE PO SOLN: 296.0000 mL | Freq: Once | ORAL | Status: DC

## 2015-03-24 MED ORDER — IOHEXOL 300 MG/ML  SOLN
100.0000 mL | Freq: Once | INTRAMUSCULAR | Status: AC | PRN
  Administered 2015-03-24: 100 mL via INTRAVENOUS

## 2015-03-24 MED ORDER — IOHEXOL 300 MG/ML  SOLN
50.0000 mL | Freq: Once | INTRAMUSCULAR | Status: DC | PRN
  Administered 2015-03-24: 50 mL via ORAL
  Filled 2015-03-24: qty 50

## 2015-03-24 NOTE — ED Notes (Signed)
Patient reports constipation for 10 days.  Reports prior to this bowel movements have been "small".  Denies taking pain medications or muscle relaxers at present.  Reports taking stool softeners (1 a day x 4 days) and milk of magnesia x 2 days without relief.

## 2015-03-24 NOTE — Discharge Instructions (Signed)
Please take miralax twice a day with magnesium citrite until your are able to have a bowel movement.  Contact your GI office if you are unable to have a bowel movement before your scheduled colonoscopy next week. Please follow up with a primary care provider listed in the Resource Guide provided below this week. Please return to the Emergency Department if symptoms worsen.   Emergency Department Resource Guide 1) Find a Doctor and Pay Out of Pocket Although you won't have to find out who is covered by your insurance plan, it is a good idea to ask around and get recommendations. You will then need to call the office and see if the doctor you have chosen will accept you as a new patient and what types of options they offer for patients who are self-pay. Some doctors offer discounts or will set up payment plans for their patients who do not have insurance, but you will need to ask so you aren't surprised when you get to your appointment.  2) Contact Your Local Health Department Not all health departments have doctors that can see patients for sick visits, but many do, so it is worth a call to see if yours does. If you don't know where your local health department is, you can check in your phone book. The CDC also has a tool to help you locate your state's health department, and many state websites also have listings of all of their local health departments.  3) Find a [name] If your illness is not likely to be very severe or complicated, you may want to try a walk in clinic. These are popping up all over the country in pharmacies, drugstores, and shopping centers. They're usually staffed by nurse practitioners or physician assistants that have been trained to treat common illnesses and complaints. They're usually fairly quick and inexpensive. However, if you have serious medical issues or chronic medical problems, these are probably not your best option.  No Primary Care Doctor: - Call Health  Connect at  [phone] - they can help you locate a primary care doctor that  accepts your insurance, provides certain services, etc. - Physician Referral Service- [phone]  Chronic Pain Problems: Organization         Address  Phone   Notes  [facility]  [phone] Patients need to be referred by their primary care doctor.   Medication Assistance: Organization         Address  Phone   Notes  [facility] Medication [facility] [address] [place], [place] [address] [phone] --Must be a resident of [name] -- Must have NO insurance coverage whatsoever (no Medicaid/ Medicare, etc.) -- The pt. MUST have a primary care doctor that directs their care regularly and follows them in the community   MedAssist  [phone]   [employer]  [phone]    Agencies that provide inexpensive medical care: Organization         Address  Phone   Notes  [place]  [phone]   [facility]    [phone]   [facility] [place], [place] [address] [phone]   [place] [address], [place] [phone]   Planned Parenthood    [phone]   [facility]    [phone]   [place] and [place],  [place] Phone:  [phone], Fax:  [phone] Hours of Operation:  9 am - 6 pm, M-F.  Also accepts Medicaid/Medicare and self-pay.  [facility] for [place], [address], [place] Phone: [phone], Fax: [phone]. Hours of Operation:  8:30 am - 5:30 pm, M-F.  Also accepts Medicaid and self-pay.  [facility] [place] [address], [place] Phone: [phone]   [place], [place], [place] [phone], Ext. [phone] Mondays & Thursdays: 7-9 AM.  First 15 patients are seen on a first come, first serve basis.     [place] Providers:  Organization         Address  Phone   Notes  [facility] [address], [place] [phone] Also accepts self-pay patients.  [facility] [address], [place]  [phone]   [place], [address], [place] [phone]   [facility] [address], [place] [phone]   Lucianne Lei [address], [place]   [phone] Only accepts [place] Access [place] patients after they have their name applied to their card.   Self-Pay (no insurance) in [facility]:  Organization         Address  Phone   Notes  Sickle Cell Patients, [facility] [place] [phone]   [facility] [place] [phone]   [facility] [place]  [place], [place], [place] [phone]   [facility]/Dr. Osei-Bonsu  [address], [place] or [address], [place] [phone] Phone number for both [place] and [place] locations is the same.  [facility] [address] [place] [phone]   [facility] [address], [place] or [address] [phone]   [facility] [address], [place] [phone], phone; [phone], fax Sees patients 1st and 3rd Saturday of every month.  Must not qualify for public or private insurance (i.e. Medicaid, Medicare, [place] Health Choice, Veterans' Benefits)  Household income should be no more than 200% of the poverty level The clinic cannot treat you if you are pregnant or think you are pregnant  Sexually transmitted diseases are not treated at the clinic.    Dental Care: Organization         Address  Phone  Notes  [facility]  Department of [facility] [place] [phone] Accepts children up to age 40 who are enrolled in [place] or [place]; pregnant women with a Medicaid card; and children who have applied for Medicaid or [place] Health Choice, but were declined, whose parents can pay a reduced fee at time of service.  [facility] Department of [facility]  [address], [place] [phone] Accepts children up to age 58 who are enrolled in [place] or [place]; pregnant women with a Medicaid card; and children who have applied for Medicaid or [place] Health Choice, but were declined, whose parents can pay a reduced fee at time of service.  [place] Adult Dental Access PROGRAM  [place] [phone] Patients are seen by  appointment only. Walk-ins are not accepted. [place] will see patients 41 years of age and older. Monday - Tuesday (8am-5pm) Most Wednesdays (8:30-5pm) $30 per visit, cash only  [facility] Adult Dental Access PROGRAM  [address], [facility] [phone] Patients are seen by appointment only. Walk-ins are not accepted. [place] will see patients 34 years of age and older. One Wednesday Evening (Monthly: Volunteer Based).  $30 per visit, cash only  [place]  [phone] for adults; Children under age 21, call Graduate Pediatric Dentistry at [phone]. Children aged 56-14, please call [phone] to request a pediatric application.  Dental services are provided in all areas of dental care including fillings, crowns and bridges, complete and partial dentures, implants, gum treatment, root canals, and extractions. Preventive care is also provided. Treatment is provided to both adults and children. Patients are selected via a lottery and there is often a waiting list.   [facility] [address] [place]  [phone]  [email or web address]   [facility] [address] [place], [place] [phone], Ext. [phone] Second and Fourth Thursday of each month, opens at 6:30 AM; Clinic ends at 9 AM.  Patients are seen on a first-come first-served basis, and a limited number are seen during each clinic.   [facility]  [address] [place], [place] [phone]   Eligibility Requirements You must have lived in [place], [place], or [place] counties for at least the last three months.   You cannot be eligible for state or federal sponsored [employer], including [employer], [place], or [employer].   You generally cannot be eligible for healthcare insurance through your employer.    How to apply: Eligibility screenings are held every Tuesday and Wednesday afternoon from 1:00 pm until 4:00 pm. You do not need an appointment for the interview!  [facility] [address] [place], [place]   [place]  [place] Department  [place]  [phone]    Behavioral Health Resources in the Community: Intensive Outpatient Programs Organization         Address  Phone  Notes  [place]. [address] [place], [place] [phone]   [facility] [address] [place], [place]   ADS: Alcohol & Drug Svcs [address], [place], [place]   [place] [address],  [place], [place] or [phone]   Substance Abuse Resources Organization         Address  Phone  Notes  Alcohol and Drug Services  [phone]   [place]  [phone]   [facility] [place]   Chinita Pester  [phone]   Residential & Outpatient Substance Abuse Program  [phone]   Psychological Services Organization          Address  Phone  Notes  [facility]  [place]  [phone]   [place] [address] [place] [phone] or [phone]    Mobile Crisis Teams Organization         Address  Phone  Notes  [facility], Mobile Crisis Care Unit  [phone]   [facility] Psych[facility]  [address] [place], [place]   [facility] [address], [place] [phone]  Self-Help/Support Groups Organization         Address  Phone             Notes  [facility]ental Health [facility]oc[facility] of [place] - variety of support groups  [place] Call for more information  Narcotics Anonymous (NA), [facility] [address], [employer] [place]  2 meetings at this location   Special educational needs teacher         Address  Phone  Notes  [facility] Residential Treatment [place],    [place]  [phone]   [facility]  [address] [place] [phone], [place], [place]   [place], [place] [phone] Admissions: 8am-3pm M-F  Incentives Substance [place] [address]    [place], [place] [phone]   [facility] [address] [place], [place], [place]   [facility] [address]  [place], [place]   [facility] Programs - Intensive Outpatient [address], [place], [place]   [facility] [address]  [place], [place] [phone] or [phone]   [facility] Services (RTS) [address] [place], [place] Accepts Medicaid  [facility] [place] [address]  [place] [phone] Substance Abuse/Addiction Treatment   [facility] Organization         Address  Phone  Notes  [facility]  [phone]   Domenic Schwab, PhD [address] [place], [place]   [phone] or [phone]   [place], [place] [phone]   [facility] [address] [place], [place] [phone] Insurance/Medicaid/sponsorship through [facility] and Families [address], [address] [place]                                    [place], [place] [phone] [place] [address], [place] [phone]    Dr. Adele Schilder  [phone]   [facility] Dept. 1) [address]  2) [place] 3)  [place], [place] [phone]  [phone]   [place] [phone] or [phone] (After Hours)

## 2015-03-24 NOTE — ED Provider Notes (Signed)
CSN: [id number]     Arrival date & time 03/24/15  0737 History   First MD Initiated Contact with Patient 03/24/15 [phone]     Chief Complaint  Patient presents with  . Constipation     (Consider location/radiation/quality/duration/timing/severity/associated sxs/prior Treatment) HPI Comments: Pt is a 54 yo male with PMH of diverticulosis who presents to the ED with complaint of constipation. Pt reports he has not had a bowel movement for the past 10 days. He notes he typically only has a BM once every 3-4 days. He also reports having small stool prior to the onset of his constipation. Endorses generalized abdominal pain that he states is a dull ache. Pt reports he also has had decrease in urination. Denies fever, headache, SOB, CP, N/V/D, blood in stool, dysuria, hematuria, weakness. Pt notes he is scheduled to have a colonoscopy done this week.    Past Medical History  Diagnosis Date  . Thyroid disease   . Hypertension   . Seizures   . Depression   . Arrhythmia   . Osteoarthritis     oa in bilateral knees  . Morbid obesity, BMI unknown   . Low back pain   . A-fib   . Depression    Past Surgical History  Procedure Laterality Date  . Achilles tendon repair  approx. 2004    right foot  . Cataract extraction      bilateral  . Tooth extraction     Family History  Problem Relation Age of Onset  . Diabetes Father   . Heart disease Father   . Diabetes Brother    Social History  Substance Use Topics  . Smoking status: Never Smoker   . Smokeless tobacco: Never Used  . Alcohol Use: No    Review of Systems  Gastrointestinal: Positive for abdominal pain and constipation.  Genitourinary: Positive for decreased urine volume.  All other systems reviewed and are negative.     Allergies  Review of patient's allergies indicates no known allergies.  Home Medications   Prior to Admission medications   Medication Sig Start Date End Date Taking? Authorizing [place]   acetaminophen (TYLENOL) 500 MG tablet Take 1,000 mg by mouth every 8 (eight) hours as needed for mild pain or moderate pain.   Yes Historical Leif Loflin, MD  allopurinol (ZYLOPRIM) 300 MG tablet Take 300 mg by mouth daily.    Yes Historical Shiron Whetsel, MD  apixaban (ELIQUIS) 5 MG TABS tablet Take 5 mg by mouth 2 (two) times daily.   Yes Historical Jase Reep, MD  benztropine (COGENTIN) 1 MG tablet Take 1 tablet (1 mg total) by mouth daily. 02/27/15  Yes Kathlee Nations, MD  desonide (DESOWEN) 0.05 % cream Apply 1 application topically daily as needed (dry skin).   Yes Historical Eli Pattillo, MD  divalproex (DEPAKOTE ER) 500 MG 24 hr tablet Take 1 tablet (500 mg total) by mouth daily. 02/27/15  Yes Kathlee Nations, MD  levothyroxine (SYNTHROID, LEVOTHROID) 100 MCG tablet Take 100 mcg by mouth daily.    Yes Historical Anyelina Claycomb, MD  perphenazine (TRILAFON) 8 MG tablet Take 3 tablets (24 mg total) by mouth at bedtime. 02/27/15  Yes Kathlee Nations, MD  phenelzine (NARDIL) 15 MG tablet TAKE 3 TABLETS (45 MG TOTAL) BY MOUTH 2 (TWO) TIMES DAILY. 02/27/15  Yes Kathlee Nations, MD  silver sulfADIAZINE (SILVADENE) 1 % cream Apply 1 application topically daily as needed (for rash).   Yes Historical Maddelynn Moosman, MD  methocarbamol (ROBAXIN) 500 MG tablet Take  1 tablet (500 mg total) by mouth 3 (three) times daily between meals as needed. Patient not taking: Reported on 03/24/2015 03/18/15   Tanna Furry, MD  oxyCODONE-acetaminophen (PERCOCET) 5-325 MG per tablet Take 1 tablet by mouth every 4 (four) hours as needed for severe pain. Patient not taking: Reported on 03/24/2015 03/11/15   Daleen Bo, MD   BP 152/86 mmHg  Pulse 90  Temp(Src) 98.6 F (37 C) (Oral)  Resp 20  SpO2 95% Physical Exam  Constitutional: He is oriented to person, place, and time. He appears well-developed and well-nourished. No distress.  Morbidly obese male.  HENT:  Head: Normocephalic and atraumatic.  Mouth/Throat: Oropharynx is clear and moist. No  oropharyngeal exudate.  Eyes: Conjunctivae and EOM are normal. Right eye exhibits no discharge. Left eye exhibits no discharge. No scleral icterus.  Neck: Normal range of motion. Neck supple.  Cardiovascular: Normal rate, regular rhythm, normal heart sounds and intact distal pulses.   Pulmonary/Chest: Effort normal and breath sounds normal. He has no wheezes. He has no rales. He exhibits no tenderness.  Abdominal: Soft. Bowel sounds are normal. He exhibits no mass. There is tenderness (generalized TTP). There is no rebound and no guarding.  Protuberant abdomen  Musculoskeletal: Normal range of motion. He exhibits no edema or tenderness.  Lymphadenopathy:    He has no cervical adenopathy.  Neurological: He is alert and oriented to person, place, and time.  Skin: Skin is warm and dry.  Nursing note and vitals reviewed.   ED Course  Procedures (including critical care time) Labs Review Labs Reviewed  BASIC METABOLIC PANEL  CBC WITH DIFFERENTIAL/PLATELET    Imaging Review No results found. I have personally reviewed and evaluated these images and lab results as part of my medical decision-making.  Filed Vitals:   03/24/15 0747  BP: 152/86  Pulse: 90  Temp: 98.6 F (37 C)  Resp: 20   Meds given in ED:  Medications  iohexol (OMNIPAQUE) 300 MG/ML solution 50 mL (50 mLs Oral Contrast Given 03/24/15 0938)  iohexol (OMNIPAQUE) 300 MG/ML solution 100 mL (100 mLs Intravenous Contrast Given 03/24/15 1056)    New Prescriptions   MAGNESIUM CITRATE SOLUTION    Take 296 mLs by mouth once. OTC   POLYETHYLENE GLYCOL POWDER (GLYCOLAX/MIRALAX) POWDER    Take 17 g by mouth 2 (two) times daily. Until daily soft stools  OTC     MDM   Final diagnoses:  Constipation, unspecified constipation type    Pt presents with constipation x 10days. Pt reports having a BM once every 3-4 days at baseline. History of diverticulosis. VSS. Abdomen with mild generalized TTP. Pt able to give urine sample  without any difficulty or associated pain. UA, BMP and CBC unremarkable. CT abdomen shows constipation without obstruction and distal colonic diverticulosis. Plan to d/c pt home with miralax and magnesium citrite. Pt advised to call GI office if he is unable to have BM before colonoscopy this week. Pt given resource guide to follow up with PCP this week.   Evaluation does not show pathology requring ongoing emergent intervention or admission. Pt is hemodynamically stable and mentating appropriately. Discussed findings/results and plan with patient/guardian, who agrees with plan. All questions answered. Return precautions discussed and outpatient follow up given.      Chesley Noon [place], [place] 03/24/15 [place], MD 03/27/15 2106

## 2015-03-25 NOTE — Telephone Encounter (Signed)
He needs to be seen earlier than his is scheduled appointment in November

## 2015-03-26 ENCOUNTER — Encounter (HOSPITAL_COMMUNITY): Payer: Self-pay | Admitting: Psychiatry

## 2015-03-26 ENCOUNTER — Ambulatory Visit (INDEPENDENT_AMBULATORY_CARE_PROVIDER_SITE_OTHER): Payer: Medicare Other | Admitting: Psychiatry

## 2015-03-26 VITALS — BP 125/89 | HR 85 | Ht 70.0 in | Wt 277.0 lb

## 2015-03-26 DIAGNOSIS — F251 Schizoaffective disorder, depressive type: Secondary | ICD-10-CM

## 2015-03-26 DIAGNOSIS — F331 Major depressive disorder, recurrent, moderate: Secondary | ICD-10-CM | POA: Diagnosis not present

## 2015-03-26 NOTE — Progress Notes (Signed)
[place] progress Note   ANANDA SITZER [id number] 54 y.o.  03/26/2015 3:56 PM  Chief Complaint:  I was very depressed but I'm feeling better now.     History of Present Illness:  Sylus came earlier than his is scheduled appointment.  He was seen 1 month ago in the office.  He is a 54 year old Caucasian unemployed single man who has long history of psychiatric illness.  He has a history of schizoaffective disorder, OCD and major depressive disorder.  He's been taking his medication Nardil, Depakote, Trilafon and Cogentin.  Lately he was given narcotic pain medication which causes severe constipation and he was seen in the emergency room multiple times.  Patient told off of that he developed more depression and felt fatigue with lack of motivation and lack of interest in his daily activities.  He called to schedule this appointment earlier than his appointment date because he was not sure if medicine needs to be changed.  However today he is feeling somewhat better.  He is no longer taking any narcotic pain medication.  Patient has a normal pain and he was diagnosed with constipation and diverticulitis.  Since stop muscle relaxant and Percocet he has seen that his energy level is improved.  He also socialize on the weekend and now felt he does not need to change his medication.  In the past he had tried other medication but it doesn't help any always come back to take Nardil.  Patient has no others issues.  He denies any suicidal thoughts or homicidal thought.  He denies any feeling of hopelessness or worthlessness.  He lives by himself.  He denies drinking or using any illegal substances.  He denies any paranoia or any hallucination.  He has no tremors or shakes.  Past Psychiatric History/Hospitalization(s) Patient has multiple hospitalizations from [phone] .  He remember mostly he was admitted due to severe depression and having suicidal thoughts with negative thinking but  denies any suicidal attempt.  He was diagnosed with major depressive disorder, schizoaffective disorder and OCD.  He admitted history of somewhat paranoia and intrusive thoughts in the past.  Since he is taking his current medication more than 35 years ago he is been pretty stable and is happy about it.  He had tried numerous psychotherapy medication mostly TCAs.  He followed outpatient services with a psychiatrist in [place] Dr. Danny Lawless, who retired couple of years ago so he started with [facility] outpatient clinic but is not satisfied and wants to change this provider.  His last psychiatrist was Dr. De Nurse in [place].  Hospitalization for psychiatric illness: Yes History of Electroconvulsive Shock Therapy: No Prior Suicide Attempts: No  Medical History; Past Medical History  Diagnosis Date  . Thyroid disease   . Hypertension   . Seizures   . Depression   . Arrhythmia   . Osteoarthritis     oa in bilateral knees  . Morbid obesity, BMI unknown   . Low back pain   . A-fib   . Depression     Allergies: No Known Allergies  Medications: Outpatient Encounter Prescriptions as of 03/26/2015  Medication Sig  . acetaminophen (TYLENOL) 500 MG tablet Take 1,000 mg by mouth every 8 (eight) hours as needed for mild pain or moderate pain.  Marland Kitchen allopurinol (ZYLOPRIM) 300 MG tablet Take 300 mg by mouth daily.   Marland Kitchen apixaban (ELIQUIS) 5 MG TABS tablet Take 5 mg by mouth 2 (two) times daily.  . benztropine (COGENTIN)  1 MG tablet Take 1 tablet (1 mg total) by mouth daily.  Marland Kitchen desonide (DESOWEN) 0.05 % cream Apply 1 application topically daily as needed (dry skin).  Marland Kitchen divalproex (DEPAKOTE ER) 500 MG 24 hr tablet Take 1 tablet (500 mg total) by mouth daily.  Marland Kitchen levothyroxine (SYNTHROID, LEVOTHROID) 100 MCG tablet Take 100 mcg by mouth daily.   . magnesium citrate solution Take 296 mLs by mouth once. OTC  . perphenazine (TRILAFON) 8 MG tablet Take 3 tablets (24 mg total) by mouth at bedtime.  .  phenelzine (NARDIL) 15 MG tablet TAKE 3 TABLETS (45 MG TOTAL) BY MOUTH 2 (TWO) TIMES DAILY.  Marland Kitchen polyethylene glycol powder (GLYCOLAX/MIRALAX) powder Take 17 g by mouth 2 (two) times daily. Until daily soft stools  OTC  . silver sulfADIAZINE (SILVADENE) 1 % cream Apply 1 application topically daily as needed (for rash).  . [DISCONTINUED] methocarbamol (ROBAXIN) 500 MG tablet Take 1 tablet (500 mg total) by mouth 3 (three) times daily between meals as needed. (Patient not taking: Reported on 03/24/2015)  . [DISCONTINUED] oxyCODONE-acetaminophen (PERCOCET) 5-325 MG per tablet Take 1 tablet by mouth every 4 (four) hours as needed for severe pain. (Patient not taking: Reported on 03/24/2015)   No facility-administered encounter medications on file as of 03/26/2015.     Substance Abuse History: Denies any use of alcohol peers there is no history suggestive of substance abuse or alcoholism.  Family History; Family History  Problem Relation Age of Onset  . Diabetes Father   . Heart disease Father   . Diabetes Brother        Labs:  Recent Results (from the past 2160 hour(s))  CBC with Differential     Status: Abnormal   Collection Time: 03/06/15  3:50 AM  Result Value Ref Range   WBC 10.6 (H) 4.0 - 10.5 K/uL   RBC 4.98 4.22 - 5.81 MIL/uL   Hemoglobin 15.1 13.0 - 17.0 g/dL   HCT 44.9 39.0 - 52.0 %   MCV 90.2 78.0 - 100.0 fL   MCH 30.3 26.0 - 34.0 pg   MCHC 33.6 30.0 - 36.0 g/dL   RDW 14.5 11.5 - 15.5 %   Platelets 230 150 - 400 K/uL   Neutrophils Relative % 64 43 - 77 %   Neutro Abs 6.8 1.7 - 7.7 K/uL   Lymphocytes Relative 17 12 - 46 %   Lymphs Abs 1.8 0.7 - 4.0 K/uL   Monocytes Relative 10 3 - 12 %   Monocytes Absolute 1.0 0.1 - 1.0 K/uL   Eosinophils Relative 9 (H) 0 - 5 %   Eosinophils Absolute 0.9 (H) 0.0 - 0.7 K/uL   Basophils Relative 0 0 - 1 %   Basophils Absolute 0.0 0.0 - 0.1 K/uL  Comprehensive metabolic panel     Status: Abnormal   Collection Time: 03/06/15  3:50 AM   Result Value Ref Range   Sodium 136 135 - 145 mmol/L   Potassium 3.9 3.5 - 5.1 mmol/L   Chloride 100 (L) 101 - 111 mmol/L   CO2 26 22 - 32 mmol/L   Glucose, Bld 232 (H) 65 - 99 mg/dL   BUN 16 6 - 20 mg/dL   Creatinine, Ser 0.99 0.61 - 1.24 mg/dL   Calcium 9.0 8.9 - 10.3 mg/dL   Total Protein 6.6 6.5 - 8.1 g/dL   Albumin 3.6 3.5 - 5.0 g/dL   AST 28 15 - 41 U/L   ALT 12 (L) 17 - 63 U/L  Alkaline Phosphatase 110 38 - 126 U/L   Total Bilirubin 0.8 0.3 - 1.2 mg/dL   GFR calc non Af Amer >60 >60 mL/min   GFR calc Af Amer >60 >60 mL/min    Comment: (NOTE) The eGFR has been calculated using the CKD EPI equation. This calculation has not been validated in all clinical situations. eGFR's persistently <60 mL/min signify possible Chronic Kidney Disease.    Anion gap 10 5 - 15  Urinalysis, Routine w reflex microscopic (not at [facility])     Status: None   Collection Time: 03/06/15  5:40 AM  Result Value Ref Range   Color, Urine YELLOW YELLOW   APPearance CLEAR CLEAR   Specific Gravity, Urine 1.028 1.005 - 1.030   pH 5.0 5.0 - 8.0   Glucose, UA NEGATIVE NEGATIVE mg/dL   Hgb urine dipstick NEGATIVE NEGATIVE   Bilirubin Urine NEGATIVE NEGATIVE   Ketones, ur NEGATIVE NEGATIVE mg/dL   Protein, ur NEGATIVE NEGATIVE mg/dL   Urobilinogen, UA 0.2 0.0 - 1.0 mg/dL   Nitrite NEGATIVE NEGATIVE   Leukocytes, UA NEGATIVE NEGATIVE    Comment: MICROSCOPIC NOT DONE ON URINES WITH NEGATIVE PROTEIN, BLOOD, LEUKOCYTES, NITRITE, OR GLUCOSE <1000 mg/dL.  Comprehensive metabolic panel     Status: Abnormal   Collection Time: 03/11/15 11:39 AM  Result Value Ref Range   Sodium 139 135 - 145 mmol/L   Potassium 3.9 3.5 - 5.1 mmol/L   Chloride 102 101 - 111 mmol/L   CO2 26 22 - 32 mmol/L   Glucose, Bld 167 (H) 65 - 99 mg/dL   BUN 20 6 - 20 mg/dL   Creatinine, Ser 0.94 0.61 - 1.24 mg/dL   Calcium 9.1 8.9 - 10.3 mg/dL   Total Protein 6.8 6.5 - 8.1 g/dL   Albumin 3.8 3.5 - 5.0 g/dL   AST 21 15 - 41 U/L   ALT  13 (L) 17 - 63 U/L   Alkaline Phosphatase 93 38 - 126 U/L   Total Bilirubin 0.5 0.3 - 1.2 mg/dL   GFR calc non Af Amer >60 >60 mL/min   GFR calc Af Amer >60 >60 mL/min    Comment: (NOTE) The eGFR has been calculated using the CKD EPI equation. This calculation has not been validated in all clinical situations. eGFR's persistently <60 mL/min signify possible Chronic Kidney Disease.    Anion gap 11 5 - 15  CBC     Status: None   Collection Time: 03/11/15 11:39 AM  Result Value Ref Range   WBC 10.5 4.0 - 10.5 K/uL   RBC 5.20 4.22 - 5.81 MIL/uL   Hemoglobin 15.6 13.0 - 17.0 g/dL   HCT 46.7 39.0 - 52.0 %   MCV 89.8 78.0 - 100.0 fL   MCH 30.0 26.0 - 34.0 pg   MCHC 33.4 30.0 - 36.0 g/dL   RDW 14.1 11.5 - 15.5 %   Platelets 202 150 - 400 K/uL  Urinalysis, Routine w reflex microscopic (not at [facility])     Status: Abnormal   Collection Time: 03/11/15 12:26 PM  Result Value Ref Range   Color, Urine AMBER (A) YELLOW    Comment: BIOCHEMICALS MAY BE AFFECTED BY COLOR   APPearance CLEAR CLEAR   Specific Gravity, Urine 1.037 (H) 1.005 - 1.030   pH 5.0 5.0 - 8.0   Glucose, UA NEGATIVE NEGATIVE mg/dL   Hgb urine dipstick NEGATIVE NEGATIVE   Bilirubin Urine SMALL (A) NEGATIVE   Ketones, ur NEGATIVE NEGATIVE mg/dL   Protein, ur  NEGATIVE NEGATIVE mg/dL   Urobilinogen, UA 0.2 0.0 - 1.0 mg/dL   Nitrite NEGATIVE NEGATIVE   Leukocytes, UA NEGATIVE NEGATIVE    Comment: MICROSCOPIC NOT DONE ON URINES WITH NEGATIVE PROTEIN, BLOOD, LEUKOCYTES, NITRITE, OR GLUCOSE <1000 mg/dL.  Urinalysis, Routine w reflex microscopic (not at [facility])     Status: Abnormal   Collection Time: 03/18/15  7:10 AM  Result Value Ref Range   Color, Urine AMBER (A) YELLOW    Comment: BIOCHEMICALS MAY BE AFFECTED BY COLOR   APPearance CLEAR CLEAR   Specific Gravity, Urine 1.036 (H) 1.005 - 1.030   pH 5.0 5.0 - 8.0   Glucose, UA 100 (A) NEGATIVE mg/dL   Hgb urine dipstick NEGATIVE NEGATIVE   Bilirubin Urine SMALL (A) NEGATIVE    Ketones, ur 15 (A) NEGATIVE mg/dL   Protein, ur NEGATIVE NEGATIVE mg/dL   Urobilinogen, UA 1.0 0.0 - 1.0 mg/dL   Nitrite NEGATIVE NEGATIVE   Leukocytes, UA NEGATIVE NEGATIVE    Comment: MICROSCOPIC NOT DONE ON URINES WITH NEGATIVE PROTEIN, BLOOD, LEUKOCYTES, NITRITE, OR GLUCOSE <1000 mg/dL.  Basic metabolic panel     Status: Abnormal   Collection Time: 03/24/15  9:41 AM  Result Value Ref Range   Sodium 136 135 - 145 mmol/L   Potassium 4.1 3.5 - 5.1 mmol/L   Chloride 99 (L) 101 - 111 mmol/L   CO2 29 22 - 32 mmol/L   Glucose, Bld 159 (H) 65 - 99 mg/dL   BUN 15 6 - 20 mg/dL   Creatinine, Ser 0.75 0.61 - 1.24 mg/dL   Calcium 8.9 8.9 - 10.3 mg/dL   GFR calc non Af Amer >60 >60 mL/min   GFR calc Af Amer >60 >60 mL/min    Comment: (NOTE) The eGFR has been calculated using the CKD EPI equation. This calculation has not been validated in all clinical situations. eGFR's persistently <60 mL/min signify possible Chronic Kidney Disease.    Anion gap 8 5 - 15  CBC with Differential     Status: None   Collection Time: 03/24/15  9:41 AM  Result Value Ref Range   WBC 5.9 4.0 - 10.5 K/uL   RBC 4.86 4.22 - 5.81 MIL/uL   Hemoglobin 14.4 13.0 - 17.0 g/dL   HCT 44.0 39.0 - 52.0 %   MCV 90.5 78.0 - 100.0 fL   MCH 29.6 26.0 - 34.0 pg   MCHC 32.7 30.0 - 36.0 g/dL   RDW 14.3 11.5 - 15.5 %   Platelets 200 150 - 400 K/uL   Neutrophils Relative % 49 %   Neutro Abs 3.0 1.7 - 7.7 K/uL   Lymphocytes Relative 27 %   Lymphs Abs 1.6 0.7 - 4.0 K/uL   Monocytes Relative 18 %   Monocytes Absolute 1.0 0.1 - 1.0 K/uL   Eosinophils Relative 5 %   Eosinophils Absolute 0.3 0.0 - 0.7 K/uL   Basophils Relative 1 %   Basophils Absolute 0.0 0.0 - 0.1 K/uL  Urinalysis, Routine w reflex microscopic     Status: None   Collection Time: 03/24/15 10:47 AM  Result Value Ref Range   Color, Urine YELLOW YELLOW   APPearance CLEAR CLEAR   Specific Gravity, Urine 1.020 1.005 - 1.030   pH 6.5 5.0 - 8.0   Glucose, UA  NEGATIVE NEGATIVE mg/dL   Hgb urine dipstick NEGATIVE NEGATIVE   Bilirubin Urine NEGATIVE NEGATIVE   Ketones, ur NEGATIVE NEGATIVE mg/dL   Protein, ur NEGATIVE NEGATIVE mg/dL   Urobilinogen, UA  0.2 0.0 - 1.0 mg/dL   Nitrite NEGATIVE NEGATIVE   Leukocytes, UA NEGATIVE NEGATIVE    Comment: MICROSCOPIC NOT DONE ON URINES WITH NEGATIVE PROTEIN, BLOOD, LEUKOCYTES, NITRITE, OR GLUCOSE <1000 mg/dL.       Musculoskeletal: Strength & Muscle Tone: within normal limits Gait & Station: normal Patient leans: N/A  Mental Status Examination;   Psychiatric Specialty Exam: Physical Exam  Constitutional: He appears well-developed.  HENT:  Head: Normocephalic and atraumatic.  Skin: He is not diaphoretic.    Review of Systems  Constitutional: Positive for malaise/fatigue.  Cardiovascular: Negative for chest pain and palpitations.  Gastrointestinal: Negative for heartburn.  Skin: Negative for itching and rash.  Neurological: Negative for headaches.  Psychiatric/Behavioral: Negative for depression and substance abuse. The patient is not nervous/anxious.     Blood pressure 125/89, pulse 85, height $RemoveBe'5\' 10"'DOUsDwOVC$  (1.778 m), weight 277 lb (125.646 kg).Body mass index is 39.75 kg/(m^2).  General Appearance: Casual, obese  Eye Contact::  Fair  Speech:  Slow  Volume:  Normal  Mood:  Euthymic and Tired  Affect:  Congruent  Thought Process:  Coherent and Linear  Orientation:  Full (Time, Place, and Person)  Thought Content:  Rumination  Suicidal Thoughts:  No  Homicidal Thoughts:  No  Memory:  Immediate;   Fair Recent;   Fair  Judgement:  Fair  Insight:  Fair  Psychomotor Activity:  Normal  Concentration:  Fair  Recall:  Fair  Akathisia:  Negative  Handed:  Right  AIMS (if indicated):     Assets:  Desire for Improvement Financial Resources/Insurance Vocational/Educational  Sleep:        Assessment: Axis I: Major depressive disorder, recurrent moderate (stable on meds). R/o Schizoaffective  disorder (as per history -suggestive of paranoia and obsessive toughts of injuring people in remote past), OCD per history (Nardil has stablized these symptoms)  Axis II: deffered  Axis III:  Past Medical History  Diagnosis Date  . Thyroid disease   . Hypertension   . Seizures   . Depression   . Arrhythmia   . Osteoarthritis     oa in bilateral knees  . Morbid obesity, BMI unknown   . Low back pain   . A-fib   . Depression     Treatment Plan and Summary: I reviewed records from emergency room including recent blood work results.  They did is no longer taking her Suboxone and Percocet.  He now believed much improvement in his energy level .  I explained narcotic pain medication can cause constipation and lack of energy.  Patient does not want to change his psychiatric medication.  However I have a long discussion that if he felt symptoms of depression coming back that he should not wait and call us back immediately.  Patient like to continue Nardil 45 mg twice a day, Depakote 500 mg at bedtime, Cogentin 1 mg daily and Trilafon 24 mg daily .  He will do Depakote level before his next appointment.  Patient is well aware about his dietary restrictions with the Nardil. Patient is scheduled to see his cardiologist early next year .  Discuss compliance with medication and follow-up appointments.  Time spent 25 minutes.  More than 50% of the time spent in counseling, psychoeducation and coordination of care.  Patient was given enough time to ask questions about his medication, diagnoses and long-term prognosis.  I will see him again in 4 weeks.    ARFEEN,SYED T., MD 03/26/2015

## 2015-03-26 NOTE — Telephone Encounter (Signed)
Medication management - Telephone call with patient after another message left by patient of still having increased problems with depression.  Arranged for patient to be seen this date 03/26/15 at 3:15pm and patient agreed with plan.

## 2015-04-04 ENCOUNTER — Ambulatory Visit: Payer: Self-pay | Admitting: Family Medicine

## 2015-04-09 ENCOUNTER — Ambulatory Visit: Payer: Self-pay | Admitting: Family Medicine

## 2015-04-11 ENCOUNTER — Other Ambulatory Visit (HOSPITAL_COMMUNITY): Payer: Self-pay | Admitting: Psychiatry

## 2015-04-12 NOTE — Telephone Encounter (Signed)
Received medication request for Nardil 15mg . Per Dr. De Nurse, medication is denied. Pt is currently seeing Dr. Marchia Bond in [facility].

## 2015-04-22 ENCOUNTER — Emergency Department (HOSPITAL_BASED_OUTPATIENT_CLINIC_OR_DEPARTMENT_OTHER)
Admission: EM | Admit: 2015-04-22 | Discharge: 2015-04-22 | Disposition: A | Discharge status: Home / Self Care | Payer: Medicare Other | Attending: Emergency Medicine | Admitting: Emergency Medicine

## 2015-04-22 ENCOUNTER — Encounter (HOSPITAL_BASED_OUTPATIENT_CLINIC_OR_DEPARTMENT_OTHER): Payer: Self-pay | Admitting: Emergency Medicine

## 2015-04-22 DIAGNOSIS — M1990 Unspecified osteoarthritis, unspecified site: Secondary | ICD-10-CM | POA: Diagnosis not present

## 2015-04-22 DIAGNOSIS — I10 Essential (primary) hypertension: Secondary | ICD-10-CM | POA: Diagnosis not present

## 2015-04-22 DIAGNOSIS — Z79899 Other long term (current) drug therapy: Secondary | ICD-10-CM | POA: Insufficient documentation

## 2015-04-22 DIAGNOSIS — E079 Disorder of thyroid, unspecified: Secondary | ICD-10-CM | POA: Insufficient documentation

## 2015-04-22 DIAGNOSIS — F329 Major depressive disorder, single episode, unspecified: Secondary | ICD-10-CM | POA: Diagnosis not present

## 2015-04-22 DIAGNOSIS — E291 Testicular hypofunction: Secondary | ICD-10-CM | POA: Diagnosis not present

## 2015-04-22 DIAGNOSIS — E349 Endocrine disorder, unspecified: Secondary | ICD-10-CM

## 2015-04-22 DIAGNOSIS — I4891 Unspecified atrial fibrillation: Secondary | ICD-10-CM | POA: Diagnosis not present

## 2015-04-22 NOTE — ED Provider Notes (Signed)
CSN: [id number]     Arrival date & time 04/22/15  0306 History   First MD Initiated Contact with Patient 04/22/15 0321     Chief Complaint  Patient presents with  . Male GU Problem     (Consider location/radiation/quality/duration/timing/severity/associated sxs/prior Treatment) HPI  This is a 54 year old male who was recently diagnosed with hypo-testosteronism. He states as a result of this he has been impotent for the past month and a half and he believes his penis has shrunk to the point that his foreskin is difficult to retract making it difficult to direct his stream. He is angry because his PCP at [facility] will not see him for 3 months and has not provided any testosterone supplementation.   Past Medical History  Diagnosis Date  . Thyroid disease   . Hypertension   . Seizures ([place])   . Depression   . Arrhythmia   . Osteoarthritis     oa in bilateral knees  . Morbid obesity, BMI unknown ([place])   . Low back pain   . A-fib ([place])   . Depression    Past Surgical History  Procedure Laterality Date  . Achilles tendon repair  approx. 2004    right foot  . Cataract extraction      bilateral  . Tooth extraction     Family History  Problem Relation Age of Onset  . Diabetes Father   . Heart disease Father   . Diabetes Brother    Social History  Substance Use Topics  . Smoking status: Never Smoker   . Smokeless tobacco: Never Used  . Alcohol Use: No    Review of Systems  All other systems reviewed and are negative.   Allergies  Review of patient's allergies indicates no known allergies.  Home Medications   Prior to Admission medications   Medication Sig Start Date End Date Taking? Authorizing Provider  acetaminophen (TYLENOL) 500 MG tablet Take 1,000 mg by mouth every 8 (eight) hours as needed for mild pain or moderate pain.    Historical Provider, MD  allopurinol (ZYLOPRIM) 300 MG tablet Take 300 mg by mouth daily.     Historical Provider, MD  apixaban (ELIQUIS) 5  MG TABS tablet Take 5 mg by mouth 2 (two) times daily.    Historical Provider, MD  benztropine (COGENTIN) 1 MG tablet Take 1 tablet (1 mg total) by mouth daily. 02/27/15   Kathlee Nations, MD  desonide (DESOWEN) 0.05 % cream Apply 1 application topically daily as needed (dry skin).    Historical Provider, MD  divalproex (DEPAKOTE ER) 500 MG 24 hr tablet Take 1 tablet (500 mg total) by mouth daily. 02/27/15   Kathlee Nations, MD  levothyroxine (SYNTHROID, LEVOTHROID) 100 MCG tablet Take 100 mcg by mouth daily.     Historical Provider, MD  magnesium citrate solution Take 296 mLs by mouth once. OTC 03/24/15   Nona Dell, PA-C  perphenazine (TRILAFON) 8 MG tablet Take 3 tablets (24 mg total) by mouth at bedtime. 02/27/15   Kathlee Nations, MD  phenelzine (NARDIL) 15 MG tablet TAKE 3 TABLETS (45 MG TOTAL) BY MOUTH 2 (TWO) TIMES DAILY. 02/27/15   Kathlee Nations, MD  polyethylene glycol powder (GLYCOLAX/MIRALAX) powder Take 17 g by mouth 2 (two) times daily. Until daily soft stools  OTC 03/24/15   Nona Dell, PA-C  silver sulfADIAZINE (SILVADENE) 1 % cream Apply 1 application topically daily as needed (for rash).    Historical Provider, MD  BP 151/90 mmHg  Pulse 112  Temp(Src) 98.1 F (36.7 C) (Oral)  Resp 17  SpO2 96%   Physical Exam  General: Well-developed, well-nourished male in no acute distress; appearance consistent with age of record HENT: normocephalic; atraumatic Eyes: pupils equal, round and reactive to light; extraocular muscles intact Neck: supple Heart: regular rate and rhythm Lungs: clear to auscultation bilaterally Abdomen: soft; nondistended; nontender; no masses or hepatosplenomegaly; bowel sounds present GU: Tanner 5 male, uncircumcised; testes descended bilaterally without mass or tenderness Extremities: No deformity; full range of motion; pulses normal Neurologic: Awake, alert and oriented; motor function intact in all extremities and symmetric; no facial  droop Skin: Warm and dry Psychiatric: Mildly angry mood with congruent affect    ED Course  Procedures (including critical care time)   MDM  The patient was advised that we do not prescribe testosterone from the ED. This is best left to an endocrinologist or urologist. We will him to [facility] for further evaluation and treatment.      Shanon Rosser, MD 04/22/15 [phone]

## 2015-04-22 NOTE — ED Notes (Signed)
Patient states he is having "personal problems". States he has a MD who provides him with testosterone, and he is unable to get an appointment at this time. States he "can't get an erection, his penis has shrunk to the point where I pee on myself." Denies pain at this time.

## 2015-04-22 NOTE — ED Notes (Signed)
MD at bedside. 

## 2015-04-23 ENCOUNTER — Emergency Department (HOSPITAL_BASED_OUTPATIENT_CLINIC_OR_DEPARTMENT_OTHER): Payer: Medicare Other

## 2015-04-23 ENCOUNTER — Emergency Department (HOSPITAL_BASED_OUTPATIENT_CLINIC_OR_DEPARTMENT_OTHER)
Admission: EM | Admit: 2015-04-23 | Discharge: 2015-04-23 | Disposition: A | Discharge status: Home / Self Care | Payer: Medicare Other | Attending: Emergency Medicine | Admitting: Emergency Medicine

## 2015-04-23 ENCOUNTER — Encounter (HOSPITAL_BASED_OUTPATIENT_CLINIC_OR_DEPARTMENT_OTHER): Payer: Self-pay | Admitting: Emergency Medicine

## 2015-04-23 DIAGNOSIS — E079 Disorder of thyroid, unspecified: Secondary | ICD-10-CM | POA: Insufficient documentation

## 2015-04-23 DIAGNOSIS — W228XXA Striking against or struck by other objects, initial encounter: Secondary | ICD-10-CM | POA: Diagnosis not present

## 2015-04-23 DIAGNOSIS — S60221A Contusion of right hand, initial encounter: Secondary | ICD-10-CM | POA: Diagnosis not present

## 2015-04-23 DIAGNOSIS — S6991XA Unspecified injury of right wrist, hand and finger(s), initial encounter: Secondary | ICD-10-CM | POA: Diagnosis present

## 2015-04-23 DIAGNOSIS — Y998 Other external cause status: Secondary | ICD-10-CM | POA: Insufficient documentation

## 2015-04-23 DIAGNOSIS — F329 Major depressive disorder, single episode, unspecified: Secondary | ICD-10-CM | POA: Diagnosis not present

## 2015-04-23 DIAGNOSIS — Y9389 Activity, other specified: Secondary | ICD-10-CM | POA: Diagnosis not present

## 2015-04-23 DIAGNOSIS — Y9289 Other specified places as the place of occurrence of the external cause: Secondary | ICD-10-CM | POA: Diagnosis not present

## 2015-04-23 DIAGNOSIS — Z8679 Personal history of other diseases of the circulatory system: Secondary | ICD-10-CM | POA: Diagnosis not present

## 2015-04-23 DIAGNOSIS — S60211A Contusion of right wrist, initial encounter: Secondary | ICD-10-CM | POA: Diagnosis not present

## 2015-04-23 DIAGNOSIS — Z79899 Other long term (current) drug therapy: Secondary | ICD-10-CM | POA: Diagnosis not present

## 2015-04-23 NOTE — Discharge Instructions (Signed)

## 2015-04-23 NOTE — ED Notes (Signed)
Pt hit his right hand on his desk today because he was angry at his doctor at [facility] "I chipped the bone in my wrist again.".  Sts he has done it 3 time before.

## 2015-04-23 NOTE — ED Provider Notes (Signed)
CSN: [id number]     Arrival date & time 04/23/15  0131 History   First MD Initiated Contact with Patient 04/23/15 0203     Chief Complaint  Patient presents with  . Wrist Injury     (Consider location/radiation/quality/duration/timing/severity/associated sxs/prior Treatment) HPI  This is a 54 year old male who slammed his right hand on a desk yesterday because he was angry. He has a history of "a chipped bone" in that wrist from a similar injury 3 times in the past. He did realize at the time that he had injured his wrist but later noticed bruising and tenderness to the ulnar aspect of his wrist. Pain is mild to moderate, worse with palpation or movement.  He was seen by myself yesterday when he sought testosterone for what he states is documented hypo-testosteronism. He states his primary care physician, whom he refers to as "a Oncologist" at [facility] refused to start him on testosterone until he was tested for sleep apnea. He further explains that he would not start him on Viagra or Cialis for reasons that were unclear. He states he has been very angry at this Dr. and that is why he slammed his fist down yesterday. He states he has spent the last 3 days crying because he is a 54 year old virgin who feels like he is being denied an opportunity to have sex like normal people. He was referred to [facility] but could not get into see them until November or December. He states he does have an appointment at [facility] in 3 days.  Past Medical History  Diagnosis Date  . Thyroid disease   . Hypertension   . Seizures ([place])   . Depression   . Arrhythmia   . Osteoarthritis     oa in bilateral knees  . Morbid obesity, BMI unknown ([place])   . Low back pain   . A-fib ([place])   . Depression    Past Surgical History  Procedure Laterality Date  . Achilles tendon repair  approx. 2004    right foot  . Cataract extraction      bilateral  . Tooth extraction     Family History   Problem Relation Age of Onset  . Diabetes Father   . Heart disease Father   . Diabetes Brother    Social History  Substance Use Topics  . Smoking status: Never Smoker   . Smokeless tobacco: Never Used  . Alcohol Use: No    Review of Systems  All other systems reviewed and are negative.   Allergies  Review of patient's allergies indicates no known allergies.  Home Medications   Prior to Admission medications   Medication Sig Start Date End Date Taking? Authorizing Provider  acetaminophen (TYLENOL) 500 MG tablet Take 1,000 mg by mouth every 8 (eight) hours as needed for mild pain or moderate pain.    Historical Provider, MD  allopurinol (ZYLOPRIM) 300 MG tablet Take 300 mg by mouth daily.     Historical Provider, MD  apixaban (ELIQUIS) 5 MG TABS tablet Take 5 mg by mouth 2 (two) times daily.    Historical Provider, MD  benztropine (COGENTIN) 1 MG tablet Take 1 tablet (1 mg total) by mouth daily. 02/27/15   Kathlee Nations, MD  desonide (DESOWEN) 0.05 % cream Apply 1 application topically daily as needed (dry skin).    Historical Provider, MD  divalproex (DEPAKOTE ER) 500 MG 24 hr tablet Take 1 tablet (500 mg total) by mouth daily. 02/27/15  Kathlee Nations, MD  levothyroxine (SYNTHROID, LEVOTHROID) 100 MCG tablet Take 100 mcg by mouth daily.     Historical Provider, MD  magnesium citrate solution Take 296 mLs by mouth once. OTC 03/24/15   Nona Dell, PA-C  perphenazine (TRILAFON) 8 MG tablet Take 3 tablets (24 mg total) by mouth at bedtime. 02/27/15   Kathlee Nations, MD  phenelzine (NARDIL) 15 MG tablet TAKE 3 TABLETS (45 MG TOTAL) BY MOUTH 2 (TWO) TIMES DAILY. 02/27/15   Kathlee Nations, MD  polyethylene glycol powder (GLYCOLAX/MIRALAX) powder Take 17 g by mouth 2 (two) times daily. Until daily soft stools  OTC 03/24/15   Nona Dell, PA-C  silver sulfADIAZINE (SILVADENE) 1 % cream Apply 1 application topically daily as needed (for rash).    Historical Provider, MD    BP 156/76 mmHg  Pulse 93  Temp(Src) 98.9 F (37.2 C) (Oral)  Resp 16  Ht 5\' 10"  (1.778 m)  Wt 271 lb (122.925 kg)  BMI 38.88 kg/m2  SpO2 95%   Physical Exam  General: Well-developed, well-nourished male in no acute distress; appearance consistent with age of record HENT: normocephalic; atraumatic Eyes: pupils equal, round and reactive to light; extraocular muscles intact Neck: supple Heart: regular rate and rhythm Lungs: Normal respiratory effort and excursion Abdomen: soft; nondistended Extremities: No deformity; full range of motion; ecchymosis and tenderness of the lower aspect of right wrist Neurologic: Awake, alert and oriented; motor function intact in all extremities and symmetric; no facial droop Skin: Warm and dry Psychiatric: Emotionally labile; angry mood with congruent affect alternated with periods of tearfulness   ED Course  Procedures (including critical care time)   MDM  Nursing notes and vitals signs, including pulse oximetry, reviewed.  Summary of this visit's results, reviewed by myself:  Imaging Studies: Dg Wrist Complete Right  04/23/2015  CLINICAL DATA:  Right wrist pain after injury. Hit wrist on desk today. EXAM: RIGHT WRIST - COMPLETE 3+ VIEW COMPARISON:  None. FINDINGS: No acute fracture or dislocation. Mild radiocarpal joint space narrowing. Scaphoid is intact. Scattered osteoarthritis. No erosion or periosteal reaction. No focal soft tissue abnormality. IMPRESSION: No acute fracture or dislocation.  Scattered osteoarthritis. Electronically Signed   By: Jeb Levering M.D.   On: 04/23/2015 02:44        Shanon Rosser, MD 04/23/15 [id number]

## 2015-04-23 NOTE — ED Notes (Signed)
MD at bedside. 

## 2015-04-24 ENCOUNTER — Encounter (HOSPITAL_BASED_OUTPATIENT_CLINIC_OR_DEPARTMENT_OTHER): Payer: Self-pay | Admitting: *Deleted

## 2015-04-24 ENCOUNTER — Encounter (HOSPITAL_COMMUNITY): Payer: Self-pay | Admitting: Psychiatry

## 2015-04-24 ENCOUNTER — Emergency Department (HOSPITAL_BASED_OUTPATIENT_CLINIC_OR_DEPARTMENT_OTHER)
Admission: EM | Admit: 2015-04-24 | Discharge: 2015-04-24 | Discharge status: Against Medical Advice | Payer: Medicare Other | Attending: Emergency Medicine | Admitting: Emergency Medicine

## 2015-04-24 ENCOUNTER — Ambulatory Visit (INDEPENDENT_AMBULATORY_CARE_PROVIDER_SITE_OTHER): Payer: Medicare Other | Admitting: Psychiatry

## 2015-04-24 VITALS — BP 132/84 | HR 92 | Ht 70.0 in | Wt 270.2 lb

## 2015-04-24 DIAGNOSIS — Z79899 Other long term (current) drug therapy: Secondary | ICD-10-CM | POA: Diagnosis not present

## 2015-04-24 DIAGNOSIS — I10 Essential (primary) hypertension: Secondary | ICD-10-CM | POA: Diagnosis not present

## 2015-04-24 DIAGNOSIS — F331 Major depressive disorder, recurrent, moderate: Secondary | ICD-10-CM | POA: Diagnosis not present

## 2015-04-24 DIAGNOSIS — F330 Major depressive disorder, recurrent, mild: Secondary | ICD-10-CM

## 2015-04-24 DIAGNOSIS — N529 Male erectile dysfunction, unspecified: Secondary | ICD-10-CM | POA: Diagnosis present

## 2015-04-24 MED ORDER — DIVALPROEX SODIUM ER 500 MG PO TB24: 500.0000 mg | ORAL_TABLET | Freq: Two times a day (BID) | ORAL | Status: DC

## 2015-04-24 NOTE — ED Notes (Signed)
8921 - 0209 - Patient is standing at the doorway and asking to leave without being seen, reports that "this is an emergency room and he should be seen right away". The patient reports that he just wants to talk with someone. This RN went into the room and let the patient talk to me at length about his concerns and feelings. The patient was tearful at times, and having tremors to his hands because of "frustration". The patient was given time to talk about his problem and how he felt about it. Multiple times he was asked if he was having Suicidal Ideations and patient was addiment about the fact that he was not. The patient after talking with this RN for about 20 minutes felt better. Patient was asked how he wanted to proceed and he reported that he felt better and he wanted to leave. This RN told the patient that he would have to sign out against medical advise since he had not seen the DR. And he reported that he was fine with that, he felt better and wanted to go home. He will follow up with his PSY MD tomorrow as previously scheduled.Patient was walked to the door after receiving signature from leaving AMA. MD updated about the patient and his status.
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
## 2015-04-25 LAB — VALPROIC ACID LEVEL: Valproic Acid Lvl: 45.3 ug/mL — ABNORMAL LOW (ref 50.0–100.0)

## 2015-04-25 LAB — HEMOGLOBIN A1C
Hgb A1c MFr Bld: 6.7 % — ABNORMAL HIGH (ref <5.7)
Mean Plasma Glucose: 146 mg/dL — ABNORMAL HIGH (ref <117)

## 2015-04-26 ENCOUNTER — Encounter: Payer: Self-pay | Admitting: Family Medicine

## 2015-04-26 ENCOUNTER — Ambulatory Visit (INDEPENDENT_AMBULATORY_CARE_PROVIDER_SITE_OTHER): Payer: Medicare Other | Admitting: Family Medicine

## 2015-04-26 VITALS — BP 122/64 | HR 75 | Temp 98.5°F | Ht 70.0 in | Wt 267.0 lb

## 2015-04-26 DIAGNOSIS — Z79899 Other long term (current) drug therapy: Secondary | ICD-10-CM

## 2015-04-26 DIAGNOSIS — E118 Type 2 diabetes mellitus with unspecified complications: Secondary | ICD-10-CM | POA: Diagnosis not present

## 2015-04-26 DIAGNOSIS — N529 Male erectile dysfunction, unspecified: Secondary | ICD-10-CM | POA: Insufficient documentation

## 2015-04-26 MED ORDER — METFORMIN HCL ER (MOD) 500 MG PO TB24: 500.0000 mg | ORAL_TABLET | Freq: Every day | ORAL | Status: DC

## 2015-04-26 NOTE — Patient Instructions (Signed)
Thank you for coming to the clinic today. It was nice seeing you.  We will send in a referral to urology. If you do not hear anything by early next week, please call our office.  We will start you on a medication for your blood sugar today called metformin. Please take 1 pill per day. This can cause upset stomach and diarrhea, though these side effects usually go away after 1-2 weeks. We may increase the dose at our next appointment.  I would like for you to come back for a lab visit next week. Please make sure you are fasting when you get your blood taken.  Please come back in 2-3 months, or sooner if you need anything.  Take care,  Dr Jerline Pain

## 2015-04-26 NOTE — Assessment & Plan Note (Addendum)
A1c elevated to 6.7 earlier this week. Will start low dose metformin. Will increase as tolerated at follow up appointment. Will discuss preventative healthcare measures at follow up appointment including foot exam, eye exam, etc.    Will check lipid panel.

## 2015-04-26 NOTE — Progress Notes (Signed)
Subjective:  [name] is a 54 y.o. male who presents to the [facility] today to establish care. His concerns today include:  HPI:  Erectile Dysfunction Patient reports long standing history of ED. States that he has been evaluated in the past and was found to have a testosterone deficiency. Was briefly on testosterone which helped his symptoms. Patient also reports that he has been exercising more which has helped. Has tried viagra in the past, however was frustrated that it took 30 minutes for him to achieve an erection. Recently had an erection while sleeping, though previously went a long time without a nocturnal erection.   Diabetes Patient with A1c of 6.7 earlier this week. States that he has been trying to eat more healthy and exercise more. No polyuria or polydipsia. No chest pain or shortness of breath.   ROS: Per HPI, otherwise all systems reviewed are negative.   PMH:  The following were reviewed and entered/updated in epic: Past Medical History  Diagnosis Date  . Thyroid disease   . Hypertension   . Seizures ([place])   . Depression   . Arrhythmia   . Osteoarthritis     oa in bilateral knees  . Morbid obesity, BMI unknown ([place])   . Low back pain   . A-fib ([place])   . Depression    Patient Active Problem List   Diagnosis Date Noted  . Erectile dysfunction 04/26/2015  . Thyroid disease   . Seizures ([place])   . Depression   . Arrhythmia   . Obesity, Class III, BMI 40-49.9 (morbid obesity) ([place])   . TESTOSTERONE DEFICIENCY 02/10/2007  . DERMATITIS, SEBORRHEIC 02/10/2007  . T2DM (type 2 diabetes mellitus) ([place]) 02/10/2007   Past Surgical History  Procedure Laterality Date  . Achilles tendon repair  approx. 2004    right foot  . Cataract extraction      bilateral  . Tooth extraction      Family History  Problem Relation Age of Onset  . Diabetes Father   . Heart disease Father   . Diabetes Brother     Medications- reviewed and updated Current Outpatient  Prescriptions  Medication Sig Dispense Refill  . allopurinol (ZYLOPRIM) 300 MG tablet Take 300 mg by mouth daily.     Marland Kitchen apixaban (ELIQUIS) 5 MG TABS tablet Take 5 mg by mouth 2 (two) times daily.    . benztropine (COGENTIN) 1 MG tablet Take 1 tablet (1 mg total) by mouth daily. 30 tablet 2  . desonide (DESOWEN) 0.05 % cream Apply 1 application topically daily as needed (dry skin).    Marland Kitchen divalproex (DEPAKOTE ER) 500 MG 24 hr tablet Take 1 tablet (500 mg total) by mouth 2 (two) times daily. 60 tablet 0  . levothyroxine (SYNTHROID, LEVOTHROID) 100 MCG tablet Take 100 mcg by mouth daily.     . Omega-3 Fatty Acids (FISH OIL) 1200 MG CPDR Take by mouth.    . perphenazine (TRILAFON) 8 MG tablet Take 3 tablets (24 mg total) by mouth at bedtime. 90 tablet 2  . phenelzine (NARDIL) 15 MG tablet TAKE 3 TABLETS (45 MG TOTAL) BY MOUTH 2 (TWO) TIMES DAILY. 180 tablet 2  . silver sulfADIAZINE (SILVADENE) 1 % cream Apply 1 application topically daily as needed (for rash).    Marland Kitchen acetaminophen (TYLENOL) 500 MG tablet Take 1,000 mg by mouth every 8 (eight) hours as needed for mild pain or moderate pain.    . metFORMIN (GLUMETZA) 500 MG (MOD) 24 hr  tablet Take 1 tablet (500 mg total) by mouth daily with breakfast. 30 tablet 3  . polyethylene glycol powder (GLYCOLAX/MIRALAX) powder Take 17 g by mouth 2 (two) times daily. Until daily soft stools  OTC (Patient not taking: Reported on 04/26/2015) 225 g 0   No current facility-administered medications for this visit.    Allergies-reviewed and updated No Known Allergies  Social History   Social History  . Marital Status: Single    Spouse Name: N/A  . Number of Children: N/A  . Years of Education: N/A   Social History Main Topics  . Smoking status: Never Smoker   . Smokeless tobacco: Never Used  . Alcohol Use: No  . Drug Use: No  . Sexual Activity: No   Other Topics Concern  . None   Social History Narrative     Objective:  Physical Exam: BP 122/64  mmHg  Pulse 75  Temp(Src) 98.5 F (36.9 C) (Oral)  Ht 5\' 10"  (1.778 m)  Wt 267 lb (121.11 kg)  BMI 38.31 kg/m2  Gen: NAD, resting comfortably CV: Irregularly irregular. No murmurs appreciated Lungs: NWOB, CTAB with no crackles, wheezes, or rhonchi GI: Normal bowel sounds present. Soft, Nontender, Nondistended. MSK: no edema, cyanosis, or clubbing noted Skin: warm, dry Neuro: grossly normal, moves all extremities Psych: Normal affect and thought content  Assessment/Plan:  Erectile dysfunction Unclear etiology. May be related to patient's numerous psych drugs. Patient not happy with previous urology visit. Will refer to urology for second opinion per patient request.  T2DM (type 2 diabetes mellitus) ([place]) A1c elevated to 6.7 earlier this week. Will start low dose metformin. Will increase as tolerated at follow up appointment. Will discuss preventative healthcare measures at follow up appointment including foot exam, eye exam, etc.    Will check lipid panel.     Algis Greenhouse. Jerline Pain, [place] Resident PGY-2 04/26/2015 4:49 PM  ;

## 2015-04-26 NOTE — Assessment & Plan Note (Signed)
Unclear etiology. May be related to patient's numerous psych drugs. Patient not happy with previous urology visit. Will refer to urology for second opinion per patient request.

## 2015-04-29 ENCOUNTER — Other Ambulatory Visit: Payer: Self-pay

## 2015-04-30 ENCOUNTER — Other Ambulatory Visit: Payer: Medicare Other

## 2015-04-30 DIAGNOSIS — Z79899 Other long term (current) drug therapy: Secondary | ICD-10-CM

## 2015-04-30 LAB — LIPID PANEL
Cholesterol: 172 mg/dL (ref 125–200)
HDL: 26 mg/dL — ABNORMAL LOW (ref >=40)
LDL Cholesterol: 112 mg/dL (ref <130)
Total CHOL/HDL Ratio: 6.6 Ratio — ABNORMAL HIGH (ref <=5.0)
Triglycerides: 170 mg/dL — ABNORMAL HIGH (ref <150)
VLDL: 34 mg/dL — ABNORMAL HIGH (ref <30)

## 2015-04-30 LAB — TSH: TSH: 2.076 u[IU]/mL (ref 0.350–4.500)

## 2015-04-30 NOTE — Progress Notes (Signed)
FLP AND TSH DONE TODAY [name]

## 2015-05-01 ENCOUNTER — Encounter: Payer: Self-pay | Admitting: Family Medicine

## 2015-05-08 ENCOUNTER — Encounter (HOSPITAL_COMMUNITY): Payer: Self-pay | Admitting: Psychiatry

## 2015-05-08 ENCOUNTER — Ambulatory Visit (INDEPENDENT_AMBULATORY_CARE_PROVIDER_SITE_OTHER): Payer: Medicare Other | Admitting: Psychiatry

## 2015-05-08 VITALS — BP 131/78 | HR 98 | Ht 70.0 in | Wt 266.8 lb

## 2015-05-08 DIAGNOSIS — F429 Obsessive-compulsive disorder, unspecified: Secondary | ICD-10-CM

## 2015-05-08 DIAGNOSIS — F330 Major depressive disorder, recurrent, mild: Secondary | ICD-10-CM

## 2015-05-08 MED ORDER — DIVALPROEX SODIUM ER 250 MG PO TB24: 250.0000 mg | ORAL_TABLET | Freq: Every day | ORAL | Status: DC

## 2015-05-08 MED ORDER — PERPHENAZINE 8 MG PO TABS: 24.0000 mg | ORAL_TABLET | Freq: Every day | ORAL | Status: DC

## 2015-05-08 MED ORDER — DIVALPROEX SODIUM ER 500 MG PO TB24: 500.0000 mg | ORAL_TABLET | Freq: Every day | ORAL | Status: DC

## 2015-05-08 MED ORDER — PHENELZINE SULFATE 15 MG PO TABS: ORAL_TABLET | ORAL | Status: DC
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
## 2015-05-10 ENCOUNTER — Other Ambulatory Visit: Payer: Self-pay | Admitting: Family Medicine

## 2015-05-10 NOTE — Telephone Encounter (Signed)
Rx filled.  Algis Greenhouse. Jerline Pain, [place] Resident PGY-2 05/10/2015 4:53 PM

## 2015-05-21 ENCOUNTER — Ambulatory Visit (INDEPENDENT_AMBULATORY_CARE_PROVIDER_SITE_OTHER): Payer: Medicare Other | Admitting: Clinical

## 2015-05-21 ENCOUNTER — Encounter (HOSPITAL_COMMUNITY): Payer: Self-pay | Admitting: Clinical

## 2015-05-21 DIAGNOSIS — F331 Major depressive disorder, recurrent, moderate: Secondary | ICD-10-CM

## 2015-05-21 DIAGNOSIS — F429 Obsessive-compulsive disorder, unspecified: Secondary | ICD-10-CM

## 2015-05-26 NOTE — Progress Notes (Signed)
Patient ID: [name], male   DOB: 1961/04/02, 54 y.o.   MRN: [id number]

## 2015-05-27 ENCOUNTER — Other Ambulatory Visit (HOSPITAL_COMMUNITY): Payer: Self-pay | Admitting: Psychiatry

## 2015-05-27 DIAGNOSIS — F330 Major depressive disorder, recurrent, mild: Secondary | ICD-10-CM

## 2015-05-28 ENCOUNTER — Ambulatory Visit (HOSPITAL_COMMUNITY): Payer: Self-pay | Admitting: Psychiatry

## 2015-05-28 ENCOUNTER — Other Ambulatory Visit: Payer: Self-pay | Admitting: *Deleted

## 2015-05-28 MED ORDER — METFORMIN HCL ER (MOD) 500 MG PO TB24: 500.0000 mg | ORAL_TABLET | Freq: Every day | ORAL | Status: DC

## 2015-05-28 NOTE — Telephone Encounter (Signed)
Rx filled.  Algis Greenhouse. Jerline Pain, [place] Resident PGY-2 05/28/2015 3:18 PM

## 2015-05-28 NOTE — Telephone Encounter (Signed)
Met with Dr. Lovena Le to review patient's record and requested refill of Depakote ER 500 mg tablets as patient now taking 750 mg a day.  Dr. Lovena Le authorized a one time refill of the medication last filled on 05/08/15 for 30 days but patient was cancelled and rescheduled from 05/28/15 to 06/19/15 so now in need of a refill to last until new appointment time.  New order for patient's Depakote ER 500 mg tablet, #30 e-scribed to patient's [facility] on [facility] [address] with no refills and patient to return on 06/19/15 for next appointment.

## 2015-06-03 ENCOUNTER — Encounter: Payer: Self-pay | Admitting: Family Medicine

## 2015-06-03 ENCOUNTER — Ambulatory Visit (INDEPENDENT_AMBULATORY_CARE_PROVIDER_SITE_OTHER): Payer: Medicare Other | Admitting: Family Medicine

## 2015-06-03 ENCOUNTER — Telehealth: Payer: Self-pay | Admitting: *Deleted

## 2015-06-03 VITALS — BP 134/84 | HR 113 | Temp 98.2°F | Wt 266.4 lb

## 2015-06-03 DIAGNOSIS — R7989 Other specified abnormal findings of blood chemistry: Secondary | ICD-10-CM

## 2015-06-03 DIAGNOSIS — E118 Type 2 diabetes mellitus with unspecified complications: Secondary | ICD-10-CM | POA: Diagnosis not present

## 2015-06-03 DIAGNOSIS — E291 Testicular hypofunction: Secondary | ICD-10-CM | POA: Diagnosis not present

## 2015-06-03 MED ORDER — METFORMIN HCL ER (OSM) 1000 MG PO TB24: 1000.0000 mg | ORAL_TABLET | Freq: Every day | ORAL | Status: DC

## 2015-06-03 NOTE — Progress Notes (Signed)
    Subjective:  [name] is a 54 y.o. male who presents to the [facility] today with a chief complaint of low testosterone.   HPI:  Low Testerone Patient reports ongoing history of low testosterone. He has been seen at his previous PCP for this but was not started on testosterone replacement. Says that he has had his levels checked in the past and they have been in the low-normal range. Currently reports symptoms of muscle loss, low libido, ED. States that his energy levels are about the same. Has not seen a urologist yet.  DM Started on metformin during the last office visit. Has been tolerating well without side effects. No chest pain or shortness of breath. No diarrhea or upset stomach. No polyuria.   ROS: Per HPI  Objective:  Physical Exam: BP 134/84 mmHg  Pulse 113  Temp(Src) 98.2 F (36.8 C) (Oral)  Wt 266 lb 6.4 oz (120.838 kg)  Gen: NAD, resting comfortably CV: RRR with no murmurs appreciated Lungs: NWOB, CTAB with no crackles, wheezes, or rhonchi GI: Normal bowel sounds present. Soft, Nontender, Nondistended. MSK: no edema, cyanosis, or clubbing noted Skin: warm, dry Neuro: grossly normal, moves all extremities Psych: Normal affect and thought content  Assessment/Plan:  Low testosterone Patient reports symptoms of low testosterone though has previously had levels within normal limits. Will defer further management to urology. Has an appointment in 2 weeks.   T2DM (type 2 diabetes mellitus) ([place]) Tolerating metformin without side effects. Will increase dose to 1000mg  daily. Will discuss preventative healthcare measures at follow up appointment including foot exam, eye exam, etc.Would also likely benefit from initiation of statin - will discuss at next appointment.     Algis Greenhouse. Jerline Pain, [facility] Resident PGY-2 06/03/2015 2:16 PM

## 2015-06-03 NOTE — Patient Instructions (Addendum)
Thank you for coming to the clinic today. It was nice seeing you.  You have an upcoming appointment with your urologist in 2 weeks. Please talk to them about your erectile dysfunction and low testerone.  Please continue taking the metformin. We will increase the dose today.   We will see you again in 2-3 months, or sooner if you need anything else.  Take care,  Dr Jerline Pain

## 2015-06-03 NOTE — Assessment & Plan Note (Signed)
Patient reports symptoms of low testosterone though has previously had levels within normal limits. Will defer further management to urology. Has an appointment in 2 weeks.

## 2015-06-03 NOTE — Assessment & Plan Note (Signed)
Tolerating metformin without side effects. Will increase dose to 1000mg  daily. Will discuss preventative healthcare measures at follow up appointment including foot exam, eye exam, etc.Would also likely benefit from initiation of statin - will discuss at next appointment.

## 2015-06-03 NOTE — Telephone Encounter (Signed)
Pharmacy called stating that Rx for metformin (FORTAMET) 1000 MG (OSM) 24 hr tablet is not covered by patient's insurance.  Pharmacy requested to change to regular Metformin ER 500 mg 2 tablets daily with breakfast.  Verbal order given by Dr. Jerline Pain.  Derl Barrow, RN

## 2015-06-16 NOTE — Progress Notes (Signed)
Comprehensive Clinical Assessment (CCA) Note  06/16/2015 [name] [id number]  Visit Diagnosis:      ICD-9-CM ICD-10-CM   1. Major depressive disorder, recurrent episode, moderate (HCC) 296.32 F33.1   2. OCD (obsessive compulsive disorder) 300.3 F42.9       CCA Part One  Part One has been completed on paper by the patient.  (See scanned document in Chart Review)  CCA Part Two A  Intake/Chief Complaint:  CCA Intake With Chief Complaint CCA Part Two Date: 05/21/15 Chief Complaint/Presenting Problem: Depression Patients Currently Reported Symptoms/Problems: Relationship problems  Individual's Strengths: "Persistant person, when I work I am really good (used to [employer])" Individual's Preferences: Individual Therapy Type of Services Patient Feels Are Needed: Individual Therapy Initial Clinical Notes/Concerns: Rhonin shared that he is having relationship problems. He shared that he met a stripper in a strip club and she says that she is his best friend and that he as a good personality. He shared that he really enjoys her company. She is 28, he is 69. He said he thought they had a good relationship but he is  starting to feel that she is using him.  He shared that they have gone out to dinner once but that mostly he sees her in the club. He shared that even though he is on a limited income he goes to the club 3x a week and spends his money on her. He shared that lately he feels like the relationship is falling apart. She  pays less attention to him when he has less money. He shared that they have not ever had sex. He shared that he only has one friend outside of the club. He shared  that he thinks he has good self esteem except for when he thinks about his relationship with this girl. He stated that he does feel bad that he is over weight. He stated that he has not ever had sex and is a fraid by the time he finds someone who is willing he will not be able to do it He stated he is  thinking about getting his testosterone tested as it might be low.   Mental Health Symptoms Depression:  Depression: Change in energy/activity, Hopelessness, Increase/decrease in appetite, Irritability, Sleep (too much or little), Tearfulness  Mania:  Mania: N/A  Anxiety:   Anxiety: Irritability  Psychosis:  Psychosis: N/A  Trauma:  Trauma: N/A  Obsessions:  Obsessions: Disrupts routine/functioning, Good insight, Cause anxiety ( - Medication keeps symptoms in check - retrace steps, tapping on the wall, if think of something that bothers me I have to go back to the first thought)  Compulsions:  Compulsions: Disrupts with routine/functioning, "Driven" to perform behaviors/acts, Good insight ( - Medication keeps symptoms in check - times I couldn't hold a job because it was so bad)  Inattention:     Hyperactivity/Impulsivity:     Oppositional/Defiant Behaviors:     Borderline Personality:  Emotional Irregularity: Mood lability  Other Mood/Personality Symptoms:      Mental Status Exam Appearance and self-care  Stature:     Weight:     Clothing:     Grooming:     Cosmetic use:     Posture/gait:     Motor activity:     Sensorium  Attention:     Concentration:     Orientation:     Recall/memory:     Affect and Mood  Affect:     Mood:     Relating  Eye contact:     Facial expression:     Attitude toward examiner:     Thought and Language  Speech flow:    Thought content:     Preoccupation:     Hallucinations:     Organization:     Transport planner of Knowledge:     Intelligence:     Abstraction:     Judgement:     Art therapist:     Insight:     Decision Making:     Social Functioning  Social Maturity:     Social Judgement:     Stress  Stressors:     Coping Ability:     Skill Deficits:     Supports:      Family and Psychosocial History: Family history Marital status: Single Are you sexually active?: No What is your sexual orientation?: heterosexual   Has your sexual activity been affected by drugs, alcohol, medication, or emotional stress?: None Does patient have children?: No  Childhood History:  Childhood History By whom was/is the patient raised?: Both parents Additional childhood history information:  (Grew up in [place]. I enjoyed growing up. I think I started having problems at 8. No friends, bullied and picked on alot. When I got to my senior year in [employer]. I began to enjoy myself. Went back to 25 year reunion and had a blast. ) Description of patient's relationship with caregiver when they were a child: Mother was negative. I didn't have good relaionship with her.  Father and I were close. Maybe too close. He was envolved.  Patient's description of current relationship with people who raised him/her: Somewhat close with my Mom now How were you disciplined when you got in trouble as a child/adolescent?: Spanked ( switch or belt)  Does patient have siblings?: Yes Number of Siblings: 1 Description of patient's current relationship with siblings: It is good. It wasn't good growing up but its better Did patient suffer any verbal/emotional/physical/sexual abuse as a child?: No Did patient suffer from severe childhood neglect?: No (Just non support) Has patient ever been sexually abused/assaulted/raped as an adolescent or adult?: No Was the patient ever a victim of a crime or a disaster?: No Witnessed domestic violence?: No Has patient been effected by domestic violence as an adult?: No  CCA Part Two B  Employment/Work Situation: Employment / Work Copywriter, advertising Employment situation: On disability Why is patient on disability: Knee and ankles  arteritis , Depression How long has patient been on disability: 10 years  Patient's job has been impacted by current illness: Yes Describe how patient's job has been impacted: Now on disability -  What is the longest time patient has a held a job?:  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 Where was the patient employed at that time?: [employer] Has patient ever been in the [employer]?: No  Education:    Religion:    Leisure/Recreation: Leisure / Recreation Leisure and Hobbies: [employer] - watch  Exercise/Diet: Exercise/Diet Do You Exercise?: No (Believes he has low testosterone, thinks and increase would help  with motivation) Have You Gained or Lost A Significant Amount of Weight in the Past Six Months?: Yes-Gained Number of Pounds Gained: 5 Do You Follow a Special Diet?: Yes Type of Diet: Gave up sweets and then also found out I was diabetic Do You Have Any Trouble Sleeping?: Yes Explanation of Sleeping Difficulties: trouble falling asleep  CCA Part Two C  Alcohol/Drug Use:  Alcohol / Drug Use History of alcohol / drug use?: No history of alcohol / drug abuse                      CCA Part Three  ASAM's:  Six Dimensions of Multidimensional Assessment  Dimension 1:  Acute Intoxication and/or Withdrawal Potential:     Dimension 2:  Biomedical Conditions and Complications:     Dimension 3:  Emotional, Behavioral, or Cognitive Conditions and Complications:     Dimension 4:  Readiness to Change:     Dimension 5:  Relapse, Continued use, or Continued Problem Potential:     Dimension 6:  Recovery/Living Environment:      Substance use Disorder (SUD)    Social Function:     Stress:     Risk Assessment- Self-Harm Potential: Risk Assessment For Self-Harm Potential Thoughts of Self-Harm: No current thoughts Method: No plan Additional Comments for Self-Harm Potential:  (Has history of hospitalization for suicidal ideation )  Risk Assessment -Dangerous to Others Potential: Risk Assessment For Dangerous to Others Potential Method: No Plan  DSM5 Diagnoses: Patient Active Problem List   Diagnosis Date Noted  . Erectile dysfunction 04/26/2015  . Thyroid disease   . Seizures ([place])   . Depression   . Arrhythmia   . Obesity, Class III,  BMI 40-49.9 (morbid obesity) ([place])   . Low testosterone 02/10/2007  . DERMATITIS, SEBORRHEIC 02/10/2007  . T2DM (type 2 diabetes mellitus) ([place]) 02/10/2007    Patient Centered Plan: Patient is on the following Treatment Plan(s): Treatment plan to be created at next session Individual therapy 1x a week, session to be less frequent as symptoms improve, follow safety plan as needed  Recommendations for Services/Supports/Treatments:    Treatment Plan Summary:    Referrals to Alternative Service(s): Referred to Alternative Service(s):   Place:   Date:   Time:    Referred to Alternative Service(s):   Place:   Date:   Time:    Referred to Alternative Service(s):   Place:   Date:   Time:    Referred to Alternative Service(s):   Place:   Date:   Time:     Rillie Riffel A

## 2015-06-18 ENCOUNTER — Ambulatory Visit (INDEPENDENT_AMBULATORY_CARE_PROVIDER_SITE_OTHER): Payer: Medicare Other | Admitting: Clinical

## 2015-06-18 DIAGNOSIS — F331 Major depressive disorder, recurrent, moderate: Secondary | ICD-10-CM | POA: Diagnosis not present

## 2015-06-18 DIAGNOSIS — F429 Obsessive-compulsive disorder, unspecified: Secondary | ICD-10-CM

## 2015-06-19 ENCOUNTER — Ambulatory Visit (INDEPENDENT_AMBULATORY_CARE_PROVIDER_SITE_OTHER): Payer: Medicare Other | Admitting: Psychiatry

## 2015-06-19 ENCOUNTER — Encounter (HOSPITAL_COMMUNITY): Payer: Self-pay | Admitting: Psychiatry

## 2015-06-19 VITALS — BP 119/88 | HR 96 | Resp 14 | Ht 70.0 in | Wt 267.2 lb

## 2015-06-19 DIAGNOSIS — F330 Major depressive disorder, recurrent, mild: Secondary | ICD-10-CM

## 2015-06-19 MED ORDER — PERPHENAZINE 8 MG PO TABS: 24.0000 mg | ORAL_TABLET | Freq: Every day | ORAL | Status: DC

## 2015-06-19 MED ORDER — BENZTROPINE MESYLATE 1 MG PO TABS: 1.0000 mg | ORAL_TABLET | Freq: Every day | ORAL | Status: DC

## 2015-06-19 MED ORDER — PHENELZINE SULFATE 15 MG PO TABS: ORAL_TABLET | ORAL | Status: DC

## 2015-06-19 MED ORDER — DEPAKOTE ER 250 MG PO TB24: ORAL_TABLET | ORAL | Status: DC

## 2015-06-19 NOTE — Progress Notes (Signed)
[place] progress Note   NICKO DAHER [id number] 54 y.o.  06/19/2015 12:00 PM  Chief Complaint:  I am feeling better.   History of Present Illness:  Edoardo came for his follow-up appointment.  He is taking Depakote 750 mg daily.  He has seen improvement in his irritability, anger, sleep and mood.  He also started seeing Tharon Aquas for counseling.  He admitted having a good relationship with his girlfriend and there has been no new issues.  He still concerned about his sexual desires and decreased libido but he is handling much better.  He feels increase Depakote help his mood and calm him down.  He wants to continue [place] for counseling.  He denies any side effects including any tremors shakes or any EPS.  He denies any paranoia or any hallucination.  Patient has been very reluctant to cut down his Nardil because he afraid that he may go into to severe depression.  His appetite is okay.  His vitals are stable.  He denies drinking or using any illegal substances.  Past Psychiatric History/Hospitalization(s) Patient has multiple hospitalizations from [phone] .  He remember mostly he was admitted due to severe depression and having suicidal thoughts with negative thinking but denies any suicidal attempt.  He was diagnosed with major depressive disorder, schizoaffective disorder and OCD.  He admitted history of somewhat paranoia and intrusive thoughts in the past.  Since he is taking his current medication more than 35 years ago he is been pretty stable and is happy about it.  He had tried numerous psychotherapy medication mostly TCAs.  He followed outpatient services with a psychiatrist in [place] Dr. Danny Lawless, who retired couple of years ago so he started with [facility] outpatient clinic but is not satisfied and wants to change this provider.  His last psychiatrist was Dr. De Nurse in [place].  Hospitalization for psychiatric illness: Yes History of Electroconvulsive  Shock Therapy: No Prior Suicide Attempts: No  Medical History; Past Medical History  Diagnosis Date  . Thyroid disease   . Hypertension   . Seizures ([place])   . Depression   . Arrhythmia   . Osteoarthritis     oa in bilateral knees  . Morbid obesity, BMI unknown ([place])   . Low back pain   . A-fib ([place])   . Depression     Allergies: No Known Allergies  Medications: Outpatient Encounter Prescriptions as of 06/19/2015  Medication Sig  . acetaminophen (TYLENOL) 500 MG tablet Take 1,000 mg by mouth every 8 (eight) hours as needed for mild pain or moderate pain.  Marland Kitchen allopurinol (ZYLOPRIM) 300 MG tablet Take 300 mg by mouth daily.   Marland Kitchen apixaban (ELIQUIS) 5 MG TABS tablet Take 5 mg by mouth 2 (two) times daily.  . benztropine (COGENTIN) 1 MG tablet Take 1 tablet (1 mg total) by mouth daily.  Marland Kitchen DEPAKOTE ER 250 MG 24 hr tablet Take 1 tab in am and 2 at bed time  . desonide (DESOWEN) 0.05 % cream Apply 1 application topically daily as needed (dry skin).  Marland Kitchen levothyroxine (SYNTHROID, LEVOTHROID) 100 MCG tablet Take 1 tablet (100 mcg total) by mouth daily.  . metformin (FORTAMET) 1000 MG (OSM) 24 hr tablet Take 1 tablet (1,000 mg total) by mouth daily with breakfast.  . Omega-3 Fatty Acids (FISH OIL) 1200 MG CPDR Take by mouth.  . perphenazine (TRILAFON) 8 MG tablet Take 3 tablets (24 mg total) by mouth at bedtime.  . phenelzine (NARDIL) 15 MG  tablet TAKE 3 TABLETS (45 MG TOTAL) BY MOUTH 2 (TWO) TIMES DAILY.  Marland Kitchen polyethylene glycol powder (GLYCOLAX/MIRALAX) powder Take 17 g by mouth 2 (two) times daily. Until daily soft stools  OTC (Patient not taking: Reported on 04/26/2015)  . silver sulfADIAZINE (SILVADENE) 1 % cream Apply 1 application topically daily as needed (for rash).  . [DISCONTINUED] benztropine (COGENTIN) 1 MG tablet Take 1 tablet (1 mg total) by mouth daily.  . [DISCONTINUED] DEPAKOTE ER 500 MG 24 hr tablet TAKE 1 TABLET EVERY DAY  . [DISCONTINUED] divalproex (DEPAKOTE ER) 250 MG  24 hr tablet Take 1 tablet (250 mg total) by mouth daily.  . [DISCONTINUED] perphenazine (TRILAFON) 8 MG tablet Take 3 tablets (24 mg total) by mouth at bedtime.  . [DISCONTINUED] phenelzine (NARDIL) 15 MG tablet TAKE 3 TABLETS (45 MG TOTAL) BY MOUTH 2 (TWO) TIMES DAILY.   No facility-administered encounter medications on file as of 06/19/2015.     Substance Abuse History: Denies any use of alcohol peers there is no history suggestive of substance abuse or alcoholism.  Family History; Family History  Problem Relation Age of Onset  . Diabetes Father   . Heart disease Father   . Diabetes Brother   . OCD Mother    Labs:  Recent Results (from the past 2160 hour(s))  Basic metabolic panel     Status: Abnormal   Collection Time: 03/24/15  9:41 AM  Result Value Ref Range   Sodium 136 135 - 145 mmol/L   Potassium 4.1 3.5 - 5.1 mmol/L   Chloride 99 (L) 101 - 111 mmol/L   CO2 29 22 - 32 mmol/L   Glucose, Bld 159 (H) 65 - 99 mg/dL   BUN 15 6 - 20 mg/dL   Creatinine, Ser 0.75 0.61 - 1.24 mg/dL   Calcium 8.9 8.9 - 10.3 mg/dL   GFR calc non Af Amer >60 >60 mL/min   GFR calc Af Amer >60 >60 mL/min    Comment: (NOTE) The eGFR has been calculated using the CKD EPI equation. This calculation has not been validated in all clinical situations. eGFR's persistently <60 mL/min signify possible Chronic Kidney Disease.    Anion gap 8 5 - 15  CBC with Differential     Status: None   Collection Time: 03/24/15  9:41 AM  Result Value Ref Range   WBC 5.9 4.0 - 10.5 K/uL   RBC 4.86 4.22 - 5.81 MIL/uL   Hemoglobin 14.4 13.0 - 17.0 g/dL   HCT 44.0 39.0 - 52.0 %   MCV 90.5 78.0 - 100.0 fL   MCH 29.6 26.0 - 34.0 pg   MCHC 32.7 30.0 - 36.0 g/dL   RDW 14.3 11.5 - 15.5 %   Platelets 200 150 - 400 K/uL   Neutrophils Relative % 49 %   Neutro Abs 3.0 1.7 - 7.7 K/uL   Lymphocytes Relative 27 %   Lymphs Abs 1.6 0.7 - 4.0 K/uL   Monocytes Relative 18 %   Monocytes Absolute 1.0 0.1 - 1.0 K/uL    Eosinophils Relative 5 %   Eosinophils Absolute 0.3 0.0 - 0.7 K/uL   Basophils Relative 1 %   Basophils Absolute 0.0 0.0 - 0.1 K/uL  Urinalysis, Routine w reflex microscopic     Status: None   Collection Time: 03/24/15 10:47 AM  Result Value Ref Range   Color, Urine YELLOW YELLOW   APPearance CLEAR CLEAR   Specific Gravity, Urine 1.020 1.005 - 1.030   pH 6.5 5.0 -  8.0   Glucose, UA NEGATIVE NEGATIVE mg/dL   Hgb urine dipstick NEGATIVE NEGATIVE   Bilirubin Urine NEGATIVE NEGATIVE   Ketones, ur NEGATIVE NEGATIVE mg/dL   Protein, ur NEGATIVE NEGATIVE mg/dL   Urobilinogen, UA 0.2 0.0 - 1.0 mg/dL   Nitrite NEGATIVE NEGATIVE   Leukocytes, UA NEGATIVE NEGATIVE    Comment: MICROSCOPIC NOT DONE ON URINES WITH NEGATIVE PROTEIN, BLOOD, LEUKOCYTES, NITRITE, OR GLUCOSE <1000 mg/dL.  Valproic acid level     Status: Abnormal   Collection Time: 04/24/15  3:45 PM  Result Value Ref Range   Valproic Acid Lvl 45.3 (L) 50.0 - 100.0 ug/mL  Hemoglobin A1c     Status: Abnormal   Collection Time: 04/24/15  3:45 PM  Result Value Ref Range   Hgb A1c MFr Bld 6.7 (H) <5.7 %    Comment:                                                                        According to the ADA Clinical Practice Recommendations for 2011, when HbA1c is used as a screening test:     >=6.5%   Diagnostic of Diabetes Mellitus            (if abnormal result is confirmed)   5.7-6.4%   Increased risk of developing Diabetes Mellitus   References:Diagnosis and Classification of Diabetes Mellitus,Diabetes TWSF,6812,75(TZGYF 1):S62-S69 and Standards of Medical Care in         Diabetes - 2011,Diabetes Care,2011,34 (Suppl 1):S11-S61.      Mean Plasma Glucose 146 (H) <117 mg/dL  Lipid panel     Status: Abnormal   Collection Time: 04/30/15  8:46 AM  Result Value Ref Range   Cholesterol 172 125 - 200 mg/dL   Triglycerides 170 (H) <150 mg/dL   HDL 26 (L) >=40 mg/dL   Total CHOL/HDL Ratio 6.6 (H) <=5.0 Ratio   VLDL 34 (H) <30 mg/dL    LDL Cholesterol 112 <130 mg/dL    Comment:   Total Cholesterol/HDL Ratio:CHD Risk                        Coronary Heart Disease Risk Table                                        Men       Women          1/2 Average Risk              3.4        3.3              Average Risk              5.0        4.4           2X Average Risk              9.6        7.1           3X Average Risk             23.4  11.0 Use the calculated Patient Ratio above and the CHD Risk table  to determine the patient's CHD Risk.   TSH     Status: None   Collection Time: 04/30/15  8:46 AM  Result Value Ref Range   TSH 2.076 0.350 - 4.500 uIU/mL       Musculoskeletal: Strength & Muscle Tone: within normal limits Gait & Station: normal Patient leans: N/A  Mental Status Examination;   Psychiatric Specialty Exam: Physical Exam  Constitutional: He appears well-developed.  HENT:  Head: Normocephalic and atraumatic.  Skin: He is not diaphoretic.    Review of Systems  Cardiovascular: Negative for chest pain and palpitations.  Gastrointestinal: Negative for heartburn.  Skin: Negative for itching and rash.  Neurological: Negative for dizziness, tingling, tremors and headaches.  Psychiatric/Behavioral: Negative for substance abuse. The patient is not nervous/anxious.     Blood pressure 119/88, pulse 96, resp. rate 14, height _0  (1.778 m), weight 267 lb 3.2 oz (121.201 kg).Body mass index is 38.34 kg/(m^2).  General Appearance: Casual, obese  Eye Contact::  Fair  Speech:  Slow  Volume:  Normal  Mood:  Euthymic and Tired  Affect:  Congruent  Thought Process:  Coherent and Linear  Orientation:  Full (Time, Place, and Person)  Thought Content:  WDL  Suicidal Thoughts:  No  Homicidal Thoughts:  No  Memory:  Immediate;   Fair Recent;   Fair  Judgement:  Fair  Insight:  Fair  Psychomotor Activity:  Normal  Concentration:  Fair  Recall:  Fair  Akathisia:  Negative  Handed:  Right  AIMS (if  indicated):     Assets:  Desire for Improvement Financial Resources/Insurance Vocational/Educational  Sleep:        Assessment: Axis I: Major depressive disorder, recurrent moderate (stable on meds). R/o Schizoaffective disorder (as per history -suggestive of paranoia and obsessive toughts of injuring people in remote past), OCD per history (Nardil has stablized these symptoms)  Axis II: deffered  Axis III:  Past Medical History  Diagnosis Date  . Thyroid disease   . Hypertension   . Seizures ([place])   . Depression   . Arrhythmia   . Osteoarthritis     oa in bilateral knees  . Morbid obesity, BMI unknown ([place])   . Low back pain   . A-fib ([place])   . Depression     Treatment Plan and Summary: Continue Depakote 750 mg at bedtime.  We will consider increasing to 1000 in the future if needed.  He preferred brand name since he tried in the past do not awaken causing GI symptoms.  He will continue Trilafon, Nardil and Cogentin at his current dose.  He is tolerating his medication.  We will do Depakote level on his next appointment.  Encouraged to keep appointment with Tharon Aquas for coping and social skills.  Follow-up in 2 months.  Time spent 25 minutes.  More than 50% of the time spent in psychoeducation, counseling and coordination of care.  Discuss safety plan that anytime having active suicidal thoughts or homicidal thoughts then patient need to call 911 or go to the local emergency room.   Leiah Giannotti T., MD 06/19/2015

## 2015-06-20 ENCOUNTER — Other Ambulatory Visit: Payer: Self-pay | Admitting: Family Medicine

## 2015-06-21 ENCOUNTER — Encounter (HOSPITAL_COMMUNITY): Payer: Self-pay | Admitting: Clinical

## 2015-06-21 NOTE — Telephone Encounter (Signed)
Rx filled.  Algis Greenhouse. Jerline Pain, [place] Resident PGY-2 06/21/2015 2:07 PM

## 2015-06-21 NOTE — Progress Notes (Signed)
   THERAPIST PROGRESS NOTE  Session Time: 12:00 -12:56  Participation Level: Active  Behavioral Response: CasualAlertDepressed  Type of Therapy: Individual Therapy  Treatment Goals addressed: Improve psychiatric symptoms, improve unhelpful thought process  Interventions: CBT and Motivational Interviewing  Summary: QUINTEZ MASELLI is a 54 y.o. male who presents with major depressive disorder, recurrent moderate, OCD.   Suicidal/Homicidal: Nowithout intent/plan  Therapist Response: [name] met with clinician for an individual session. Valgene discussed his psychiatric symptoms and current life events. Client and clinician discussed and agreed upon goals for therapy.  Bralin shared that his depression is related to his feelings of isolation. Maximilien shared that he does not have very many friends outside of the [employer]. He shared about a friendship he has with a stripper. He shared that he feels used by her because she does not seem to be interested in him unless he has money to spend. Clinician asked open ended questions about his relationship. 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 Jawann has the insight that he is being used for his finances and that she treats all the paying customers with the same attention but he also holds out the belief that she will eventually be interested in a relationship outside of the club. Chico is a virgin and so appreciates the sexual attention and the idea of having a relationship. He seems to at understand the strippers job and at the same time hold a rather naive idea of the dynamics of it.  Client and clinician discussed the possibility of developing friendships outside of the club. Giovonnie shared that his only other interest is in hockey but only attends games 1 -2 times a year. He shared he cannot invest in other hobbies because of finances. .Client and clinician discussed whether it would be possible to  either invest some of his money in a different activities or participate in free activities where he could meet additional friends.  He shared he would like to travel if he had money. Clinician asked if he didn't spend money at the club how much would he have. He shared the amount which would be enough to allow some travel. Client and clinician agreed to discuss the topic of friends (because of his isolation increases his depression) at future sessions.   Plan: Return again in 1 -2 weeks.  Diagnosis: Axis I: major depressive disorder, recurrent moderate, OCD.       Jerel Shepherd, LCSW 06/18/15

## 2015-07-02 ENCOUNTER — Ambulatory Visit (HOSPITAL_COMMUNITY): Payer: Self-pay | Admitting: Clinical

## 2015-07-09 ENCOUNTER — Ambulatory Visit (INDEPENDENT_AMBULATORY_CARE_PROVIDER_SITE_OTHER): Payer: Medicare Other | Admitting: Clinical

## 2015-07-09 ENCOUNTER — Encounter (HOSPITAL_COMMUNITY): Payer: Self-pay | Admitting: Clinical

## 2015-07-09 DIAGNOSIS — F429 Obsessive-compulsive disorder, unspecified: Secondary | ICD-10-CM | POA: Diagnosis not present

## 2015-07-09 DIAGNOSIS — F331 Major depressive disorder, recurrent, moderate: Secondary | ICD-10-CM | POA: Diagnosis not present

## 2015-07-09 NOTE — Progress Notes (Signed)
   THERAPIST PROGRESS NOTE  Session Time: 4:37 - 5:32  Participation Level: Active  Behavioral Response: CasualAlertDepressed  Type of Therapy: Individual Therapy  Treatment Goals addressed: Improve psychiatric symptoms, elevate mood ("getting out more"), Improve unhelpful thinking patterns, interpersonal relationship skills  Interventions: CBT, Motivational Interviewing, Grounding and Mindfulness   Summary: [name] is a 55 y.o. male who presents with Major depressive disorder, recurrent episode, moderate and OCD (obsessive compulsive disorder)  Suicidal/Homicidal: No - without intent/plan  Therapist Response:  [name] met with clinician for an individual session. [name] discussed his psychiatric symptoms and his current life.[name] shared that he had a okay Christmas. He shared that he took the girl from the club (a stripper) out for a shopping excursion. She shared that he really enjoyed his day with her. He shared that he has developed concerns that she might be drinking and possibly doing drugs. He shared that she was cold to him last time after finding out he was out of money.  He shared this made him feel very sad and hopeless. He shared he was feeling suicidal because of the thought. He shared he would like to have a relationship with her (she has a boyfriend) but can not if she drinks and does drugs. He shared that his thoughts spiral and he began to think about how lonely he is, that he might die a virgin, that his testosterone is low and that even if given the opportunity he might be unable to preform. [name] shared that he thought if he was thinner and Clinician asked if he had the numbers for if he was suicidal which he stated he did. Client and clinician discussed the nature of having friends who's friendship is dependent on your finances.  Client and clinician discussed engaging in other activities ( at last session) to meet new people. When asked what he thought about that  [name] listed several reasons why he wouldn't be likely to including his belief that no pretty girls are at the other activities. He shared that he is only interested in pretty girls. [name] and clinician discussed his loneliness and his desire to improve it. Client and clinician discussed getting out to meet friends rather than focusing on a girlfriend. Client and clinician discussed how social interaction might improve his mood. Clinician introduced grounding and mindfulness techniques. Client and clinician practiced a technique together. [name] agreed to practice the technique daily until next session.   Plan: Return again in 1-2 weeks.  Diagnosis: Axis I: Major depressive disorder, recurrent episode, moderate and OCD (obsessive compulsive disorder)   Jayant Kriz A, LCSW 07/09/2015

## 2015-07-18 ENCOUNTER — Ambulatory Visit (INDEPENDENT_AMBULATORY_CARE_PROVIDER_SITE_OTHER): Payer: Medicare Other | Admitting: Clinical

## 2015-07-18 ENCOUNTER — Encounter (HOSPITAL_COMMUNITY): Payer: Self-pay | Admitting: Clinical

## 2015-07-18 DIAGNOSIS — F331 Major depressive disorder, recurrent, moderate: Secondary | ICD-10-CM

## 2015-07-18 NOTE — Progress Notes (Signed)
   THERAPIST PROGRESS NOTE  Session Time: 11:03 - 12:00  Participation Level: Active  Behavioral Response: CasualAlertDepressed  Type of Therapy: Individual Therapy  Treatment Goals addressed: Improve psychiatric symptoms, elevate mood ("getting out more"), Improve unhelpful thinking patterns, interpersonal relationship skills  Interventions: CBT, Motivational Interviewing, Grounding and Mindfulness   Summary: AH BOTT is a 55 y.o. male who presents with Major depressive disorder, recurrent episode, moderate and OCD (obsessive compulsive disorder)  Suicidal/Homicidal: No - without intent/plan  Therapist Response:  [name] met with clinician for an individual session. Odysseus discussed his psychiatric symptoms and his current life events. Donye shared that this was not his regular appointment time, that he had called and was able to get an earlier one. He shared he was experiencing  A lot of distress because the girl he would like to have a relationship with is going to marry another man. He shared that he felt hopeless, but denied any suicidal or homicidal ideation. Ashten shared that the girl told him she would be moving and would not be able to spend anymore time with him and because of her other relationship he could no longer call her. Client and clinician discussed the importance of respecting her boundaries. He shared that this really hurt his feeling and that he has been crying since finding out. He explained that she was his best friend and now felt lonely. Client and clinician discussed his feeling and what he could do to prevent his depression from spiraling. Lilburn listed some steps he could and was willing to take. He shared he was going to reach out to an old friend he had not spoken to in a while. He stated he was going to go have lunch where the people know him. Client and clinician discussed the steps he could take if her felt his depression was getting worse. Erich stated he  felt better after talking about it.   Plan: Return again in 1-2 weeks.  Diagnosis: Axis I: Major depressive disorder, recurrent episode, moderate and OCD (obsessive compulsive disorder)   Yanky Vanderburg A, LCSW 07/18/2015

## 2015-08-02 ENCOUNTER — Other Ambulatory Visit: Payer: Self-pay | Admitting: Family Medicine

## 2015-08-05 NOTE — Telephone Encounter (Signed)
Rx filled.  Algis Greenhouse. Jerline Pain, [place] Resident PGY-2 08/05/2015 9:09 AM

## 2015-08-06 DIAGNOSIS — R5382 Chronic fatigue, unspecified: Secondary | ICD-10-CM | POA: Diagnosis not present

## 2015-08-06 DIAGNOSIS — E291 Testicular hypofunction: Secondary | ICD-10-CM | POA: Diagnosis not present

## 2015-08-06 DIAGNOSIS — N5201 Erectile dysfunction due to arterial insufficiency: Secondary | ICD-10-CM | POA: Diagnosis not present

## 2015-08-06 DIAGNOSIS — Z0000 Encounter for general adult medical examination without abnormal findings: Secondary | ICD-10-CM | POA: Diagnosis not present

## 2015-08-07 ENCOUNTER — Ambulatory Visit (INDEPENDENT_AMBULATORY_CARE_PROVIDER_SITE_OTHER): Payer: Medicare Other | Admitting: Clinical

## 2015-08-07 ENCOUNTER — Encounter (HOSPITAL_COMMUNITY): Payer: Self-pay | Admitting: Clinical

## 2015-08-07 DIAGNOSIS — F331 Major depressive disorder, recurrent, moderate: Secondary | ICD-10-CM | POA: Diagnosis not present

## 2015-08-07 DIAGNOSIS — F429 Obsessive-compulsive disorder, unspecified: Secondary | ICD-10-CM

## 2015-08-07 NOTE — Progress Notes (Signed)
   THERAPIST PROGRESS NOTE  Session Time: 2:36 - 3:33  Participation Level: Active  Behavioral Response: CasualAlertDepressed  Type of Therapy: Individual Therapy  Treatment Goals addressed: Improve psychiatric symptoms, elevate mood ("getting out more"), Improve unhelpful thinking patterns, interpersonal relationship skills  Interventions: CBT, Motivational Interviewing,   Summary: [name] is a 55 y.o. male who presents with Major depressive disorder, recurrent episode, moderate and OCD (obsessive compulsive disorder)  Suicidal/Homicidal: No - without intent/plan  Therapist Response:  [name] met with clinician for an individual session. Brentyn discussed his psychiatric symptoms and his current life events. [name] shared he had planned to call his friend to go to a hockey game but his friend ended up calling him first and made plans to go. [name] and clinician discussed how [name] felt about this. [name] shared he was excited but his friend ended up canceling and then calling back to apologize. [name] said he didn't answer and was not going to call his friend back. [name] and clinician discussed [name]'s automatic thoughts and his negative core belief (Nobody likes me). [name] and clinician discussed the evidence for and against his belief. He recognized that his friend wouldn't have tried to make plans with him if he had no desire to see him and he decided he would call him back. [name] and clinician discussed his goal to get out more and to make new friends. [name] discussed his friend who is a stripper. He stated "I starting to feel like she is using me. After I spend my money, she doesn't talk to me. I am not sure what to do about it. She does give me good dances." [name] then talked about how lonely he is. [name] and clinician discussed healthy friendships. [name] is very invested in his relationships at the club) Clinician asked open ended questions about his day and his efforts to meet  new people. He shared that he goes out to breakfast 2x a week. He then shared that he used to have friends when he would go to baseball games Psychologist, sport and exercise) and the gym. [name] and clinician discussed his mood and his desire to improve his mood   Plan: Return again in 1-2 weeks.  Diagnosis: Axis I: Major depressive disorder, recurrent episode, moderate and OCD (obsessive compulsive disorder)   Wendal Wilkie A, LCSW 08/07/2015

## 2015-08-12 ENCOUNTER — Other Ambulatory Visit (HOSPITAL_COMMUNITY): Payer: Self-pay | Admitting: Psychiatry

## 2015-08-14 ENCOUNTER — Ambulatory Visit (INDEPENDENT_AMBULATORY_CARE_PROVIDER_SITE_OTHER): Payer: Medicare Other | Admitting: Psychiatry

## 2015-08-14 ENCOUNTER — Encounter (HOSPITAL_COMMUNITY): Payer: Self-pay | Admitting: Psychiatry

## 2015-08-14 VITALS — BP 128/82 | HR 73 | Resp 12 | Ht 70.0 in | Wt 268.0 lb

## 2015-08-14 DIAGNOSIS — F330 Major depressive disorder, recurrent, mild: Secondary | ICD-10-CM

## 2015-08-14 DIAGNOSIS — Z79899 Other long term (current) drug therapy: Secondary | ICD-10-CM | POA: Diagnosis not present

## 2015-08-14 MED ORDER — BENZTROPINE MESYLATE 1 MG PO TABS: 1.0000 mg | ORAL_TABLET | Freq: Every day | ORAL | Status: DC

## 2015-08-14 MED ORDER — PERPHENAZINE 8 MG PO TABS: 24.0000 mg | ORAL_TABLET | Freq: Every day | ORAL | Status: DC

## 2015-08-14 MED ORDER — DEPAKOTE ER 250 MG PO TB24: ORAL_TABLET | ORAL | Status: DC

## 2015-08-14 MED ORDER — PHENELZINE SULFATE 15 MG PO TABS: ORAL_TABLET | ORAL | Status: DC

## 2015-08-14 NOTE — Progress Notes (Signed)
[facility] progress Note   EVEN POLIO [id number] 55 y.o.  08/14/2015 3:03 PM  Chief Complaint:  I am feeling better.   History of Present Illness:  [name] came for his follow-up appointment.  He is sleeping better since started Depakote.  He is less irritable and less angry.  He admitted Depakote keeping him calm and he is not getting angry.  He is seeing Tharon Aquas for counseling.  He is tolerating his medication and reported no side effects.  He has no tremors, shakes or any EPS.  He denies any hallucination or any paranoia.  He is taking Nardil resulting his depression.  Patient denies drinking or using any illegal substances.  He denies any feeling of hopelessness or worthlessness.  He denies any crying spells.  His Christmas was good .  He continues to have friendship with his male friend and reported relationship is going very well.  There has been no recent issues.  His appetite is okay.  His vitals are stable.  Past Psychiatric History/Hospitalization(s) Patient has multiple hospitalizations from [phone] .  He remember mostly he was admitted due to severe depression and having suicidal thoughts with negative thinking but denies any suicidal attempt.  He was diagnosed with major depressive disorder, schizoaffective disorder and OCD.  He admitted history of somewhat paranoia and intrusive thoughts in the past.  Since he is taking his current medication more than 35 years ago he is been pretty stable and is happy about it.  He had tried numerous psychotherapy medication mostly TCAs.  He followed outpatient services with a psychiatrist in [place] Dr. Danny Lawless, who retired couple of years ago so he started with [facility] outpatient clinic but is not satisfied and wants to change this provider.  His last psychiatrist was Dr. De Nurse in [place].  Hospitalization for psychiatric illness: Yes History of Electroconvulsive Shock Therapy: No Prior Suicide Attempts:  No  Medical History; Past Medical History  Diagnosis Date  . Thyroid disease   . Hypertension   . Seizures ([place])   . Depression   . Arrhythmia   . Osteoarthritis     oa in bilateral knees  . Morbid obesity, BMI unknown ([place])   . Low back pain   . A-fib ([place])   . Depression     Allergies: No Known Allergies  Medications: Outpatient Encounter Prescriptions as of 08/14/2015  Medication Sig  . acetaminophen (TYLENOL) 500 MG tablet Take 1,000 mg by mouth every 8 (eight) hours as needed for mild pain or moderate pain.  Marland Kitchen allopurinol (ZYLOPRIM) 300 MG tablet Take 300 mg by mouth daily.   Marland Kitchen apixaban (ELIQUIS) 5 MG TABS tablet Take 5 mg by mouth 2 (two) times daily.  . benztropine (COGENTIN) 1 MG tablet Take 1 tablet (1 mg total) by mouth daily.  Marland Kitchen DEPAKOTE ER 250 MG 24 hr tablet Take 1 tab in am and 2 at bed time  . desonide (DESOWEN) 0.05 % cream Apply 1 application topically daily as needed (dry skin).  Marland Kitchen levothyroxine (SYNTHROID, LEVOTHROID) 100 MCG tablet Take 1 tablet (100 mcg total) by mouth daily.  . metFORMIN (GLUCOPHAGE-XR) 500 MG 24 hr tablet Take 2 tablets (1,000 mg total) by mouth daily with breakfast.  . metFORMIN (GLUCOPHAGE-XR) 500 MG 24 hr tablet TAKE 1 TABLET (500 MG TOTAL) BY MOUTH DAILY WITH BREAKFAST.  Marland Kitchen Omega-3 Fatty Acids (FISH OIL) 1200 MG CPDR Take by mouth.  . perphenazine (TRILAFON) 8 MG tablet Take 3 tablets (24 mg  total) by mouth at bedtime.  . phenelzine (NARDIL) 15 MG tablet TAKE 3 TABLETS (45 MG TOTAL) BY MOUTH 2 (TWO) TIMES DAILY.  Marland Kitchen polyethylene glycol powder (GLYCOLAX/MIRALAX) powder Take 17 g by mouth 2 (two) times daily. Until daily soft stools  OTC (Patient not taking: Reported on 04/26/2015)  . silver sulfADIAZINE (SILVADENE) 1 % cream Apply 1 application topically daily as needed (for rash).  . [DISCONTINUED] benztropine (COGENTIN) 1 MG tablet Take 1 tablet (1 mg total) by mouth daily.  . [DISCONTINUED] DEPAKOTE ER 250 MG 24 hr tablet Take 1 tab  in am and 2 at bed time  . [DISCONTINUED] perphenazine (TRILAFON) 8 MG tablet Take 3 tablets (24 mg total) by mouth at bedtime.  . [DISCONTINUED] phenelzine (NARDIL) 15 MG tablet TAKE 3 TABLETS (45 MG TOTAL) BY MOUTH 2 (TWO) TIMES DAILY.   No facility-administered encounter medications on file as of 08/14/2015.     Substance Abuse History: Denies any use of alcohol peers there is no history suggestive of substance abuse or alcoholism.  Family History; Family History  Problem Relation Age of Onset  . Diabetes Father   . Heart disease Father   . Diabetes Brother   . OCD Mother    Labs:  No results found for this or any previous visit (from the past 2160 hour(s)).     Musculoskeletal: Strength & Muscle Tone: within normal limits Gait & Station: normal Patient leans: N/A  Mental Status Examination;   Psychiatric Specialty Exam: Physical Exam  Constitutional: He appears well-developed.  HENT:  Head: Normocephalic and atraumatic.  Skin: He is not diaphoretic.    Review of Systems  Cardiovascular: Negative for chest pain and palpitations.  Gastrointestinal: Negative for heartburn.  Skin: Negative for itching and rash.  Neurological: Negative for dizziness, tingling, tremors and headaches.  Psychiatric/Behavioral: Negative for substance abuse. The patient is not nervous/anxious.     Blood pressure 128/82, pulse 73, resp. rate 12, height 5\' 10"  (1.778 m), weight 268 lb (121.564 kg).Body mass index is 38.45 kg/(m^2).  General Appearance: Casual, obese  Eye Contact::  Fair  Speech:  Slow  Volume:  Normal  Mood:  Euthymic and Tired  Affect:  Congruent  Thought Process:  Coherent and Linear  Orientation:  Full (Time, Place, and Person)  Thought Content:  WDL  Suicidal Thoughts:  No  Homicidal Thoughts:  No  Memory:  Immediate;   Fair Recent;   Fair  Judgement:  Fair  Insight:  Fair  Psychomotor Activity:  Normal  Concentration:  Fair  Recall:  Fair  Akathisia:   Negative  Handed:  Right  AIMS (if indicated):     Assets:  Desire for Improvement Financial Resources/Insurance Vocational/Educational  Sleep:        Assessment: Axis I: Major depressive disorder, recurrent moderate (stable on meds). R/o Schizoaffective disorder (as per history -suggestive of paranoia and obsessive toughts of injuring people in remote past), OCD per history (Nardil has stablized these symptoms)  Axis II: deffered  Axis III:  Past Medical History  Diagnosis Date  . Thyroid disease   . Hypertension   . Seizures ([place])   . Depression   . Arrhythmia   . Osteoarthritis     oa in bilateral knees  . Morbid obesity, BMI unknown ([place])   . Low back pain   . A-fib ([place])   . Depression     Treatment Plan and Summary:  patient is stable on his current psychiatric medication.  He is  seeing therapist for counseling and recent changes in the Depakote had helped him.  I will continue Depakote 750 mg at bedtime, Cogentin 1 mg at bedtime, Trilafon 24 mg at bedtime and Nardil 45 mg twice a day.  We will do Depakote level   Encouraged to keep appointment with Tharon Aquas for coping and social skills.  Follow-up in 2 months.  recommended to call us back if he has any question or any concern.  Discuss safety plan that anytime having active suicidal thoughts or homicidal thoughts then patient need to call 911 or go to the local emergency room.   Merlin Ege T., MD 08/14/2015

## 2015-08-15 ENCOUNTER — Other Ambulatory Visit: Payer: Self-pay | Admitting: Urology

## 2015-08-15 DIAGNOSIS — E229 Hyperfunction of pituitary gland, unspecified: Principal | ICD-10-CM

## 2015-08-15 DIAGNOSIS — R7989 Other specified abnormal findings of blood chemistry: Secondary | ICD-10-CM

## 2015-08-20 ENCOUNTER — Ambulatory Visit (HOSPITAL_COMMUNITY): Payer: Self-pay | Admitting: Psychiatry

## 2015-08-27 ENCOUNTER — Ambulatory Visit (HOSPITAL_COMMUNITY)
Admission: RE | Admit: 2015-08-27 | Discharge: 2015-08-27 | Disposition: A | Discharge status: Home / Self Care | Payer: Medicare Other | Source: Ambulatory Visit | Attending: Urology | Admitting: Urology

## 2015-08-27 DIAGNOSIS — E229 Hyperfunction of pituitary gland, unspecified: Secondary | ICD-10-CM | POA: Diagnosis not present

## 2015-08-27 DIAGNOSIS — G3189 Other specified degenerative diseases of nervous system: Secondary | ICD-10-CM | POA: Insufficient documentation

## 2015-08-27 DIAGNOSIS — I6782 Cerebral ischemia: Secondary | ICD-10-CM | POA: Diagnosis not present

## 2015-08-27 DIAGNOSIS — R7989 Other specified abnormal findings of blood chemistry: Secondary | ICD-10-CM

## 2015-08-27 DIAGNOSIS — E221 Hyperprolactinemia: Secondary | ICD-10-CM | POA: Diagnosis not present

## 2015-08-27 LAB — POCT I-STAT CREATININE: Creatinine, Ser: 0.9 mg/dL (ref 0.61–1.24)

## 2015-08-27 MED ORDER — GADOBENATE DIMEGLUMINE 529 MG/ML IV SOLN
15.0000 mL | Freq: Once | INTRAVENOUS | Status: AC | PRN
  Administered 2015-08-27: 15 mL via INTRAVENOUS

## 2015-09-04 ENCOUNTER — Other Ambulatory Visit: Payer: Self-pay | Admitting: Family Medicine

## 2015-09-05 ENCOUNTER — Encounter (HOSPITAL_COMMUNITY): Payer: Self-pay | Admitting: Clinical

## 2015-09-05 ENCOUNTER — Ambulatory Visit (INDEPENDENT_AMBULATORY_CARE_PROVIDER_SITE_OTHER): Payer: Medicare Other | Admitting: Clinical

## 2015-09-05 DIAGNOSIS — F331 Major depressive disorder, recurrent, moderate: Secondary | ICD-10-CM | POA: Diagnosis not present

## 2015-09-05 DIAGNOSIS — F429 Obsessive-compulsive disorder, unspecified: Secondary | ICD-10-CM | POA: Diagnosis not present

## 2015-09-05 NOTE — Telephone Encounter (Signed)
Rx filled.  Algis Greenhouse. Jerline Pain, [place] Resident PGY-2 09/05/2015 8:45 AM

## 2015-09-05 NOTE — Progress Notes (Signed)
   THERAPIST PROGRESS NOTE  Session Time: 2:32 - 3:28  Participation Level: Active  Behavioral Response: CasualAlertNA  Type of Therapy: Individual Therapy  Treatment Goals addressed: Improve psychiatric symptoms, elevate mood ("getting out more"), Improve unhelpful thinking patterns,  Interventions: CBT, Motivational Interviewing,  Summary: [name] is a 55 y.o. male who presents with Major depressive disorder, recurrent episode, moderate and OCD (obsessive compulsive disorder)  Suicidal/Homicidal: No - without intent/plan  Therapist Response:  [name] met with clinician for an individual session. [name] discussed his psychiatric symptoms, his current life events and his homework. [name] shared that he had made some changes that improved his mood since last session. [name] shared that his friend wasn't at work the past two weeks and so he spent his time doing other things - reading and going to hockey game. [name] shared that his mother came to visit and that they enjoyed their time together. Client and clinician discussed how this affected his mood and his thoughts and insight about it. [name] had the insight that he could expand his activities and not to be so singularly focused. [name] shared that he had not yet completed his depression packet "when I am depressed I don't want to do it, when I am feeling good I don't want to wreck my mood." Clinician explained that information in the packet would help him to recognize the things that increase depression as well as skills to decrease depression. Clinician asked open ended questions and [name] shared his motivation for therapy to improve his skills and overall wellbeing. Clinician discussed the importance of thinking about and practicing skills when not in therapy. Clinician asked what activities he might do away from the club might help him improve his mood. [name] shared he could do his homework, read and that he could attend baseball games (  local games will be starting soon,) He shared that he also plans to go to a hockey game with his brother soon. He shared that past hobbies (collecting hockey cards) were isolating rather than social. He stated he was not interested in going to the gym, but might try to go for some walks. [name] shared (something that increases his depression) that he has erectile dysfunction. He shared that while he is a virgin, he would like to experience sexual pleasure. He shared he had a brain scan ( he reports he has had these for other reasons in the past). He is waiting for the results. He shared he is very hopeful that he will get some assistance in one form or another. [name] shared how his dysfunction increases his negative thoughts about himself. Client and clinician discussed his negative thoughts and other ways to think about himself. [name] shared he felt better being able to discuss his challenges. [name] asked if he could go longer between session. Clinician agreed. He plans to come back in 3 weeks rather than 2.   Plan: Return again in 2-3 weeks.  Diagnosis: Axis I: Major depressive disorder, recurrent episode, moderate and OCD (obsessive compulsive disorder)   [name] A, LCSW 09/05/2015

## 2015-09-09 DIAGNOSIS — E229 Hyperfunction of pituitary gland, unspecified: Secondary | ICD-10-CM | POA: Diagnosis not present

## 2015-09-09 DIAGNOSIS — R5382 Chronic fatigue, unspecified: Secondary | ICD-10-CM | POA: Diagnosis not present

## 2015-09-09 DIAGNOSIS — Z79899 Other long term (current) drug therapy: Secondary | ICD-10-CM | POA: Diagnosis not present

## 2015-09-09 DIAGNOSIS — N5201 Erectile dysfunction due to arterial insufficiency: Secondary | ICD-10-CM | POA: Diagnosis not present

## 2015-09-09 DIAGNOSIS — H43813 Vitreous degeneration, bilateral: Secondary | ICD-10-CM | POA: Diagnosis not present

## 2015-09-09 DIAGNOSIS — E119 Type 2 diabetes mellitus without complications: Secondary | ICD-10-CM | POA: Diagnosis not present

## 2015-09-09 DIAGNOSIS — H524 Presbyopia: Secondary | ICD-10-CM | POA: Diagnosis not present

## 2015-09-09 DIAGNOSIS — F330 Major depressive disorder, recurrent, mild: Secondary | ICD-10-CM | POA: Diagnosis not present

## 2015-09-10 LAB — VALPROIC ACID LEVEL: Valproic Acid Lvl: 61.3 ug/mL (ref 50.0–100.0)

## 2015-09-19 ENCOUNTER — Ambulatory Visit (HOSPITAL_COMMUNITY): Payer: Self-pay | Admitting: Clinical

## 2015-10-08 ENCOUNTER — Ambulatory Visit (INDEPENDENT_AMBULATORY_CARE_PROVIDER_SITE_OTHER): Payer: Medicare Other | Admitting: Clinical

## 2015-10-08 ENCOUNTER — Other Ambulatory Visit: Payer: Self-pay | Admitting: *Deleted

## 2015-10-08 ENCOUNTER — Encounter (HOSPITAL_COMMUNITY): Payer: Self-pay | Admitting: Clinical

## 2015-10-08 DIAGNOSIS — F429 Obsessive-compulsive disorder, unspecified: Secondary | ICD-10-CM | POA: Diagnosis not present

## 2015-10-08 DIAGNOSIS — F331 Major depressive disorder, recurrent, moderate: Secondary | ICD-10-CM

## 2015-10-08 NOTE — Telephone Encounter (Signed)
90 day supply. Martin, Tamika L, RN  

## 2015-10-08 NOTE — Progress Notes (Signed)
   THERAPIST PROGRESS NOTE  Session Time: 2:34 -3:30  Participation Level: Active  Behavioral Response: CasualAlertDepressed  Type of Therapy: Individual Therapy  Treatment Goals addressed: Improve psychiatric symptoms, elevate mood ("getting out more"), Improve unhelpful thinking patterns, interpersonal relationship skills  Interventions: CBT, Motivational Interviewing, Grounding and Mindfulness   Summary: JAIMON BUGAJ is a 56 y.o. male who presents with Major depressive disorder, recurrent episode, moderate and OCD (obsessive compulsive disorder)  Suicidal/Homicidal: No - without intent/plan  Therapist Response:  [name] met with clinician for an individual session. Cherokee discussed his psychiatric symptoms, his current life events and his homework. Cortlandt shared that he has been doing okay. He shared that he had gotten in trouble with the law last fall after an outburst in a doctors office. He shared that he has taken care of the legal matter and is feeling okay about the outcome.He shared that he still has not completed his homework packet but will try to before next session. Eliah shared that he has decided to attend some other social settings and plans to start going to the local baseball games. Clinician encouraged him to do so. Client and clinician discussed how varied activities and healthy social interactions could help to improve his mood. Clinician asked open ended questions and Xavyer shared about positive social interactions he has had in the past . He shared about how such interactions have helped in the past. Client and clinician discussed how our thoughts affect our mood and experience.    Plan: Return again in 2-3 weeks.  Diagnosis: Axis I: Major depressive disorder, recurrent episode, moderate and OCD (obsessive compulsive disorder)    Jude Naclerio A, LCSW 10/08/2015

## 2015-10-09 ENCOUNTER — Telehealth (HOSPITAL_COMMUNITY): Payer: Self-pay

## 2015-10-09 MED ORDER — LEVOTHYROXINE SODIUM 100 MCG PO TABS: 100.0000 ug | ORAL_TABLET | Freq: Every day | ORAL | Status: DC

## 2015-10-09 NOTE — Telephone Encounter (Signed)
Met with Dr. Adele Schilder twice this date to prepare a letter for [facility], per request of patient to verify he is under the care of Dr. Adele Schilder, his diagnosis and on medication.  Dr. Adele Schilder reviewed and signed prepared letter.  Called patient to inform the letter was prepared for him to pick up as he stated he would pick up the letter when in for an appointment on 10/14/15.  Informed patient the letter would be left at our front reception for him to pick up and copy sent to be scanned into [facility].

## 2015-10-09 NOTE — Telephone Encounter (Signed)
Rx filled.  Algis Greenhouse. Jerline Pain, [place] [facility] Resident PGY-2 10/09/2015 8:21 AM

## 2015-10-13 ENCOUNTER — Other Ambulatory Visit (HOSPITAL_COMMUNITY): Payer: Self-pay | Admitting: Psychiatry

## 2015-10-14 ENCOUNTER — Ambulatory Visit (HOSPITAL_COMMUNITY): Payer: Self-pay | Admitting: Psychiatry

## 2015-10-14 ENCOUNTER — Encounter (HOSPITAL_COMMUNITY): Payer: Self-pay | Admitting: Psychiatry

## 2015-10-14 ENCOUNTER — Ambulatory Visit (INDEPENDENT_AMBULATORY_CARE_PROVIDER_SITE_OTHER): Payer: Medicare Other | Admitting: Psychiatry

## 2015-10-14 VITALS — BP 112/68 | HR 60 | Ht 70.5 in | Wt 263.6 lb

## 2015-10-14 DIAGNOSIS — F330 Major depressive disorder, recurrent, mild: Secondary | ICD-10-CM | POA: Diagnosis not present

## 2015-10-14 MED ORDER — PERPHENAZINE 8 MG PO TABS: 24.0000 mg | ORAL_TABLET | Freq: Every day | ORAL | Status: DC

## 2015-10-14 MED ORDER — DEPAKOTE ER 250 MG PO TB24: ORAL_TABLET | ORAL | Status: DC

## 2015-10-14 MED ORDER — BENZTROPINE MESYLATE 1 MG PO TABS: 1.0000 mg | ORAL_TABLET | Freq: Every day | ORAL | Status: DC

## 2015-10-14 MED ORDER — PHENELZINE SULFATE 15 MG PO TABS
ORAL_TABLET | ORAL | Status: DC
Start: 2015-10-14 — End: 2016-01-08

## 2015-10-14 NOTE — Progress Notes (Signed)
**Note Anthony-Identified via Obfuscation** [facility] progress Note   [name] [id number] 55 y.o.  10/14/2015 10:53 AM  Chief Complaint:  Medication management and follow-up.    History of Present Illness:  [name] came for his follow-up appointment.  He is taking his medication as prescribed.  He has seen much improvement since we added Depakote .  He described his mood is a stable and he denies any irritability, anger, frustration.  He sleeping good.  He has no tremors or shakes.  He had Depakote level which was 61.5.  He is seeing [name] for counseling.  Denies any paranoia or any hallucination.  His energy level is good.  He denies any feeling of hopelessness or worthlessness.  He is currently in a relationship which is going very well.  His appetite is okay.  His vitals are stable.  Past Psychiatric History/Hospitalization(s) Patient has multiple hospitalizations from [phone] .  He remember mostly he was admitted due to severe depression and having suicidal thoughts with negative thinking but denies any suicidal attempt.  He was diagnosed with major depressive disorder, schizoaffective disorder and OCD.  He admitted history of somewhat paranoia and intrusive thoughts in the past.  Since he is taking his current medication more than 35 years ago he is been pretty stable and is happy about it.  He had tried numerous psychotherapy medication mostly TCAs.  He followed outpatient services with a psychiatrist in [place] Dr. Danny Trujillo, who retired couple of years ago so he started with [facility] outpatient clinic but is not satisfied and wants to change this provider.  His last psychiatrist was Dr. De Trujillo in [place].  Hospitalization for psychiatric illness: Yes History of Electroconvulsive Shock Therapy: No Prior Suicide Attempts: No  Medical History; Past Medical History  Diagnosis Date  . Thyroid disease   . Hypertension   . Seizures ([place])   . Depression   . Arrhythmia   . Osteoarthritis    oa in bilateral knees  . Morbid obesity, BMI unknown ([place])   . Low back pain   . A-fib ([place])   . Depression     Allergies: No Known Allergies  Medications: Outpatient Encounter Prescriptions as of 10/14/2015  Medication Sig  . acetaminophen (TYLENOL) 500 MG tablet Take 1,000 mg by mouth every 8 (eight) hours as needed for mild pain or moderate pain.  Marland Kitchen allopurinol (ZYLOPRIM) 300 MG tablet Take 300 mg by mouth daily.   Marland Kitchen apixaban (ELIQUIS) 5 MG TABS tablet Take 5 mg by mouth 2 (two) times daily.  . benztropine (COGENTIN) 1 MG tablet Take 1 tablet (1 mg total) by mouth daily.  Marland Kitchen DEPAKOTE ER 250 MG 24 hr tablet Take 1 tab in am and 2 at bed time  . desonide (DESOWEN) 0.05 % cream Apply 1 application topically daily as needed (dry skin).  Marland Kitchen levothyroxine (SYNTHROID, LEVOTHROID) 100 MCG tablet Take 1 tablet (100 mcg total) by mouth daily.  . metFORMIN (GLUCOPHAGE-XR) 500 MG 24 hr tablet Take 2 tablets (1,000 mg total) by mouth daily with breakfast.  . Omega-3 Fatty Acids (FISH OIL) 1200 MG CPDR Take by mouth.  . perphenazine (TRILAFON) 8 MG tablet Take 3 tablets (24 mg total) by mouth at bedtime.  . phenelzine (NARDIL) 15 MG tablet TAKE 3 TABLETS (45 MG TOTAL) BY MOUTH 2 (TWO) TIMES DAILY.  Marland Kitchen polyethylene glycol powder (GLYCOLAX/MIRALAX) powder Take 17 g by mouth 2 (two) times daily. Until daily soft stools  OTC (Patient not taking: Reported on 04/26/2015)  .  silver sulfADIAZINE (SILVADENE) 1 % cream Apply 1 application topically daily as needed (for rash).  . [DISCONTINUED] benztropine (COGENTIN) 1 MG tablet Take 1 tablet (1 mg total) by mouth daily.  . [DISCONTINUED] DEPAKOTE ER 250 MG 24 hr tablet Take 1 tab in am and 2 at bed time  . [DISCONTINUED] metFORMIN (GLUCOPHAGE-XR) 500 MG 24 hr tablet TAKE 1 TABLET (500 MG TOTAL) BY MOUTH DAILY WITH BREAKFAST.  . [DISCONTINUED] perphenazine (TRILAFON) 8 MG tablet Take 3 tablets (24 mg total) by mouth at bedtime.  . [DISCONTINUED] phenelzine  (NARDIL) 15 MG tablet TAKE 3 TABLETS (45 MG TOTAL) BY MOUTH 2 (TWO) TIMES DAILY.   No facility-administered encounter medications on file as of 10/14/2015.     Substance Abuse History: Denies any use of alcohol peers there is no history suggestive of substance abuse or alcoholism.  Family History; Family History  Problem Relation Age of Onset  . Diabetes Father   . Heart disease Father   . Diabetes Brother   . OCD Mother    Labs:  Recent Results (from the past 2160 hour(s))  I-STAT creatinine     Status: None   Collection Time: 08/27/15 12:29 PM  Result Value Ref Range   Creatinine, Ser 0.90 0.61 - 1.24 mg/dL  Valproic acid level     Status: None   Collection Time: 09/09/15  2:15 PM  Result Value Ref Range   Valproic Acid Lvl 61.3 50.0 - 100.0 ug/mL       Musculoskeletal: Strength & Muscle Tone: within normal limits Gait & Station: normal Patient leans: N/A  Mental Status Examination;   Psychiatric Specialty Exam: Physical Exam  Constitutional: He appears well-developed.  HENT:  Head: Normocephalic and atraumatic.  Skin: He is not diaphoretic.    Review of Systems  Cardiovascular: Negative for chest pain and palpitations.  Gastrointestinal: Negative for heartburn.  Skin: Negative for itching and rash.  Neurological: Negative for dizziness, tingling, tremors and headaches.  Psychiatric/Behavioral: Negative for substance abuse. The patient is not nervous/anxious.     Blood pressure 112/68, pulse 60, height 5' 10.5" (1.791 m), weight 263 lb 9.6 oz (119.568 kg).Body mass index is 37.28 kg/(m^2).  General Appearance: Casual, obese  Eye Contact::  Fair  Speech:  Slow  Volume:  Normal  Mood:  Euthymic and Tired  Affect:  Congruent  Thought Process:  Coherent and Linear  Orientation:  Full (Time, Place, and Person)  Thought Content:  WDL  Suicidal Thoughts:  No  Homicidal Thoughts:  No  Memory:  Immediate;   Fair Recent;   Fair  Judgement:  Fair  Insight:   Fair  Psychomotor Activity:  Normal  Concentration:  Fair  Recall:  Fair  Akathisia:  Negative  Handed:  Right  AIMS (if indicated):     Assets:  Desire for Improvement Financial Resources/Insurance Vocational/Educational  Sleep:        Assessment: Axis I: Major depressive disorder, recurrent moderate (stable on meds). R/o Schizoaffective disorder (as per history -suggestive of paranoia and obsessive toughts of injuring people in remote past), OCD per history (Nardil has stablized these symptoms)  Axis II: deffered  Axis III:  Past Medical History  Diagnosis Date  . Thyroid disease   . Hypertension   . Seizures ([place])   . Depression   . Arrhythmia   . Osteoarthritis     oa in bilateral knees  . Morbid obesity, BMI unknown ([place])   . Low back pain   . A-fib ([place])   .  Depression     Treatment Plan and Summary: Patient is stable on his current psychiatric medication.  He has no side effects.  I will continue Trilafon 24 mg at bedtime, Cogentin 1 mg at bedtime, Depakote 250 mg in the morning and 500 mg at bedtime and Nardil 45 mg twice a day.  Reviewed and discussed Depakote level .  Patient has no issues.  He is well aware about his medication side effects especially dietary restrictions with Nardil.  Encouraged to keep appointment with [facility] for counseling.  Recommended to call us back if he has any question or any concern.  Discuss safety plan that anytime having active suicidal thoughts or homicidal thoughts then patient need to call 911 or go to the local emergency room.  Follow-up in 3 months.   Winta Barcelo T., MD 10/14/2015

## 2015-12-04 ENCOUNTER — Other Ambulatory Visit: Payer: Self-pay | Admitting: Family Medicine

## 2015-12-04 NOTE — Telephone Encounter (Signed)
Rx filled.  Algis Greenhouse. Jerline Pain, [place] Resident PGY-2 12/04/2015 11:35 AM

## 2015-12-09 ENCOUNTER — Ambulatory Visit (HOSPITAL_COMMUNITY): Payer: Self-pay | Admitting: Clinical

## 2015-12-16 DIAGNOSIS — E291 Testicular hypofunction: Secondary | ICD-10-CM | POA: Diagnosis not present

## 2015-12-16 DIAGNOSIS — N5201 Erectile dysfunction due to arterial insufficiency: Secondary | ICD-10-CM | POA: Diagnosis not present

## 2015-12-23 ENCOUNTER — Ambulatory Visit (INDEPENDENT_AMBULATORY_CARE_PROVIDER_SITE_OTHER): Payer: Medicare Other | Admitting: Family Medicine

## 2015-12-23 VITALS — BP 120/82 | HR 64 | Temp 97.8°F | Ht 71.0 in | Wt 262.0 lb

## 2015-12-23 DIAGNOSIS — L821 Other seborrheic keratosis: Secondary | ICD-10-CM

## 2015-12-23 DIAGNOSIS — R21 Rash and other nonspecific skin eruption: Secondary | ICD-10-CM | POA: Diagnosis not present

## 2015-12-23 DIAGNOSIS — Z1159 Encounter for screening for other viral diseases: Secondary | ICD-10-CM

## 2015-12-23 DIAGNOSIS — I868 Varicose veins of other specified sites: Secondary | ICD-10-CM

## 2015-12-23 DIAGNOSIS — Z114 Encounter for screening for human immunodeficiency virus [HIV]: Secondary | ICD-10-CM | POA: Diagnosis not present

## 2015-12-23 DIAGNOSIS — E039 Hypothyroidism, unspecified: Secondary | ICD-10-CM

## 2015-12-23 DIAGNOSIS — E1310 Other specified diabetes mellitus with ketoacidosis without coma: Secondary | ICD-10-CM

## 2015-12-23 DIAGNOSIS — I8390 Asymptomatic varicose veins of unspecified lower extremity: Secondary | ICD-10-CM

## 2015-12-23 DIAGNOSIS — E118 Type 2 diabetes mellitus with unspecified complications: Secondary | ICD-10-CM

## 2015-12-23 DIAGNOSIS — E079 Disorder of thyroid, unspecified: Secondary | ICD-10-CM | POA: Diagnosis not present

## 2015-12-23 DIAGNOSIS — E1110 Type 2 diabetes mellitus with ketoacidosis without coma: Secondary | ICD-10-CM

## 2015-12-23 DIAGNOSIS — L219 Seborrheic dermatitis, unspecified: Secondary | ICD-10-CM | POA: Diagnosis not present

## 2015-12-23 LAB — COMPLETE METABOLIC PANEL WITH GFR
ALT: 7 U/L — ABNORMAL LOW (ref 9–46)
AST: 13 U/L (ref 10–35)
Albumin: 4.1 g/dL (ref 3.6–5.1)
Alkaline Phosphatase: 90 U/L (ref 40–115)
BUN: 12 mg/dL (ref 7–25)
CO2: 30 mmol/L (ref 20–31)
Calcium: 9.1 mg/dL (ref 8.6–10.3)
Chloride: 100 mmol/L (ref 98–110)
Creat: 0.79 mg/dL (ref 0.70–1.33)
GFR, Est African American: >89 mL/min (ref >=60)
GFR, Est Non African American: >89 mL/min (ref >=60)
Glucose, Bld: 105 mg/dL — ABNORMAL HIGH (ref 65–99)
Potassium: 4.3 mmol/L (ref 3.5–5.3)
Sodium: 138 mmol/L (ref 135–146)
Total Bilirubin: 0.6 mg/dL (ref 0.2–1.2)
Total Protein: 6.7 g/dL (ref 6.1–8.1)

## 2015-12-23 LAB — CBC
HCT: 44.1 % (ref 38.5–50.0)
Hemoglobin: 14.7 g/dL (ref 13.2–17.1)
MCH: 29.7 pg (ref 27.0–33.0)
MCHC: 33.3 g/dL (ref 32.0–36.0)
MCV: 89.1 fL (ref 80.0–100.0)
MPV: 11.3 fL (ref 7.5–12.5)
Platelets: 201 10*3/uL (ref 140–400)
RBC: 4.95 MIL/uL (ref 4.20–5.80)
RDW: 14.9 % (ref 11.0–15.0)
WBC: 9.4 10*3/uL (ref 3.8–10.8)

## 2015-12-23 LAB — HIV ANTIBODY (ROUTINE TESTING W REFLEX): HIV 1&2 Ab, 4th Generation: NONREACTIVE

## 2015-12-23 LAB — POCT GLYCOSYLATED HEMOGLOBIN (HGB A1C): Hemoglobin A1C: 6

## 2015-12-23 LAB — HEPATITIS C ANTIBODY: HCV Ab: NEGATIVE

## 2015-12-23 LAB — POCT SKIN KOH: Skin KOH, POC: NEGATIVE

## 2015-12-23 LAB — TSH: TSH: 1.23 mIU/L (ref 0.40–4.50)

## 2015-12-23 MED ORDER — KETOCONAZOLE 2 % EX SHAM: 1.0000 "application " | MEDICATED_SHAMPOO | CUTANEOUS | Status: DC

## 2015-12-23 NOTE — Assessment & Plan Note (Signed)
LEsion on left temple consistent with SK. Discussed benign nature with patient. Deferred management today. Warning signs of skin cancer reviewed. Return precautions reviewed.

## 2015-12-23 NOTE — Assessment & Plan Note (Signed)
Several year history. Not responding to conservative management. Will refer to vascular surgery for possible ablation.

## 2015-12-23 NOTE — Progress Notes (Signed)
    Subjective:  [name] is a 55 y.o. male who presents to the [facility] today with a chief complaint of T2DM follow up.   HPI:  T2DM Currently on metformin. Has noticed a rash over the last several weeks that he thinks may be related (see problem below). Otherwise has not noticed any side effects. Is trying to cut down on carbs. No daily exercise. No polyuria or polydipsia.   Rash Patient reports several weeks of a pruritic rash located mostly on his trunk. He first noticed the lesion on his abdomen which then resolved and then moved to his back. He has tried over the counter cortisone cream which helped some. No fevers or chills. Rash is better now, thinks he may only have a small patch on his back. No shortness of breath.   Face Lesion Patient also noticed a small "mole" on his right temple area for the last few months. Is worried because it is getting larger and darker. No history of skin cancer. Lesion does not bleed or itch.   Varicose Veins Patient also reports varicose veins in his lower extremities (right>left) for the past several years. The veins are occasionally painful. Has tried elevation which has not seemed to help.   Hypothyroidism Tolerating synthroid at 158mcg daily without any significant side effects.  ROS: Per HPI  PMH: Smoking history reviewed.   Objective:  Physical Exam: BP 120/82 mmHg  Pulse 64  Temp(Src) 97.8 F (36.6 C) (Oral)  Ht 5\' 11"  (1.803 m)  Wt 262 lb (118.842 kg)  BMI 36.56 kg/m2  Gen: NAD, resting comfortably CV: RRR with no murmurs appreciated Pulm: NWOB, CTAB with no crackles, wheezes, or rhonchi GI: Normal bowel sounds present. Soft, Nontender, Nondistended. MSK: Significant amount of varicosity noted in RLE. No ulcers or bleeding noted.  Skin:  -Back: Faintly erythematous, raised, flaky  2x3cm patch on back -Face: Small 0.5cm round waxy lesion on left temporal area Neuro: grossly normal, moves all extremities Psych: Normal affect  and thought content   Assessment/Plan:  T2DM (type 2 diabetes mellitus) (HCC) A1c  today. Continue metformin. Will check urine microalbumin today.   DERMATITIS, SEBORRHEIC Rash on back most consistent with sebhorrheic dermatitis. Will treat with topical ketoconazole. Skin KOH pending.   Seborrheic keratosis LEsion on left temple consistent with SK. Discussed benign nature with patient. Deferred management today. Warning signs of skin cancer reviewed. Return precautions reviewed.   Varicose veins Several year history. Not responding to conservative management. Will refer to vascular surgery for possible ablation.   Thyroid disease Check TSH today. Continue synthroid at current dose.     Algis Greenhouse. Jerline Pain, [facility] Resident PGY-2 12/23/2015 11:29 AM

## 2015-12-23 NOTE — Assessment & Plan Note (Signed)
Check TSH today. Continue synthroid at current dose.

## 2015-12-23 NOTE — Assessment & Plan Note (Addendum)
A1c drawn today. Continue metformin. Will check urine microalbumin today.

## 2015-12-23 NOTE — Patient Instructions (Signed)
We will be checking blood work today. If we need to make any changes, you will got a phone call. Otherwise you should be getting a letter in the mail within the next few business days with the results.  We will continue your metformin.  I think your rash is seborrheic dermatitis. I will send in ketoconazole for this. Please apply for 10 minutes at a time then wash off. Do this every day for 5 days, then as needed. If your rash is not getting better, please let us know.  The mole on your face is called a seborrheic keratosis. This is benign. Sometimes we can try to freeze this off. If you would like this, please let us know.  I will be putting in a referral to the vascular surgeon for your varicose veins. Please continue to keep your legs elevated and exercise as much as possible.  Please let us know when you want to have your colonoscopy.   Please come back in 3-6 months, or sooner if you need anything else.   Take care,  Dr Jerline Pain

## 2015-12-23 NOTE — Assessment & Plan Note (Signed)
Rash on back most consistent with sebhorrheic dermatitis. Will treat with topical ketoconazole. Skin KOH pending.

## 2015-12-24 ENCOUNTER — Encounter: Payer: Self-pay | Admitting: Family Medicine

## 2015-12-24 ENCOUNTER — Ambulatory Visit (HOSPITAL_COMMUNITY): Payer: Self-pay | Admitting: Clinical

## 2015-12-24 LAB — MICROALBUMIN / CREATININE URINE RATIO
Creatinine, Urine: 183 mg/dL (ref 20–370)
Microalb Creat Ratio: 5 mcg/mg creat (ref <30)
Microalb, Ur: 1 mg/dL

## 2015-12-27 ENCOUNTER — Other Ambulatory Visit: Payer: Self-pay | Admitting: *Deleted

## 2015-12-27 DIAGNOSIS — I83813 Varicose veins of bilateral lower extremities with pain: Secondary | ICD-10-CM

## 2015-12-31 ENCOUNTER — Other Ambulatory Visit: Payer: Self-pay | Admitting: *Deleted

## 2015-12-31 MED ORDER — LEVOTHYROXINE SODIUM 100 MCG PO TABS
100.0000 ug | ORAL_TABLET | Freq: Every day | ORAL | Status: DC
Start: 2015-12-31 — End: 2016-12-25

## 2016-01-03 ENCOUNTER — Other Ambulatory Visit (HOSPITAL_COMMUNITY): Payer: Self-pay | Admitting: Psychiatry

## 2016-01-08 ENCOUNTER — Other Ambulatory Visit (HOSPITAL_COMMUNITY): Payer: Self-pay | Admitting: Psychiatry

## 2016-01-08 ENCOUNTER — Other Ambulatory Visit (HOSPITAL_COMMUNITY): Payer: Self-pay

## 2016-01-08 DIAGNOSIS — F330 Major depressive disorder, recurrent, mild: Secondary | ICD-10-CM

## 2016-01-08 MED ORDER — DEPAKOTE ER 250 MG PO TB24: ORAL_TABLET | ORAL | Status: DC

## 2016-01-08 NOTE — Telephone Encounter (Signed)
Per Dr. Adele Schilder I sent on a refill for one month on patients depakote.

## 2016-01-09 ENCOUNTER — Ambulatory Visit (HOSPITAL_COMMUNITY): Payer: Self-pay | Admitting: Clinical

## 2016-01-16 ENCOUNTER — Telehealth (HOSPITAL_COMMUNITY): Payer: Self-pay

## 2016-01-16 NOTE — Telephone Encounter (Signed)
Patient called in because he had to be rescheduled to the end of August due to Dr. Adele Schilder being out of the country. Patient was concerned about running out of Depakote since he increased his dose to 4 a day instead of the prescribed 3 a day. I explained to the patient that I had just called an order into the pharmacy, and even if he has decided to increase his dose, he should still have enough medication to last until Dr. Adele Schilder gets back and I can ask him about the dosage increase. Patient was upset that he was scheduled at the end of August when Dr. Adele Schilder is coming back at the end of July. I told the patient he was probably given the first available appointment and that it was at the end of August because we have had to reschedule all of his appointments. Patient got angry and told me I was being smart with him and hung up the phone.

## 2016-01-19 ENCOUNTER — Other Ambulatory Visit (HOSPITAL_COMMUNITY): Payer: Self-pay | Admitting: Psychiatry

## 2016-01-21 ENCOUNTER — Ambulatory Visit (HOSPITAL_COMMUNITY): Payer: Self-pay | Admitting: Psychiatry

## 2016-01-31 ENCOUNTER — Encounter: Payer: Self-pay | Admitting: Vascular Surgery

## 2016-02-03 ENCOUNTER — Other Ambulatory Visit (HOSPITAL_COMMUNITY): Payer: Self-pay | Admitting: Psychiatry

## 2016-02-03 ENCOUNTER — Encounter: Payer: Self-pay | Admitting: Vascular Surgery

## 2016-02-03 ENCOUNTER — Ambulatory Visit (INDEPENDENT_AMBULATORY_CARE_PROVIDER_SITE_OTHER): Payer: Medicare Other | Admitting: Vascular Surgery

## 2016-02-03 VITALS — BP 121/82 | HR 90 | Temp 97.0°F | Resp 16 | Ht 70.0 in | Wt 267.0 lb

## 2016-02-03 DIAGNOSIS — F330 Major depressive disorder, recurrent, mild: Secondary | ICD-10-CM

## 2016-02-03 DIAGNOSIS — I83893 Varicose veins of bilateral lower extremities with other complications: Secondary | ICD-10-CM | POA: Insufficient documentation

## 2016-02-03 NOTE — Progress Notes (Signed)
Subjective:     Patient ID: [name], male   DOB: 09-22-1960, 55 y.o.   MRN: [id number]  HPI this 55 year old male was referred by Dr. Jerline Pain for evaluation of bilateral painful varicosities right worsen left. Patient has had varicose veins in the right thigh and calf and in the left thigh and calf for the past 10 years which have becoming increasingly symptomatic with aching throbbing burning discomfort. He develops swelling is a day progresses. He does not were elastic compression stockings on a regular basis. He has no history of DVT thrombophlebitis stasis ulcers or bleeding. Symptoms are worsening.  Past Medical History:  Diagnosis Date  . A-fib ([place])   . Arrhythmia   . Depression   . Depression   . Hypertension   . Low back pain   . Morbid obesity, BMI unknown ([place])   . Osteoarthritis    oa in bilateral knees  . Seizures ([place])   . Thyroid disease   . Varicose veins     Social History  Substance Use Topics  . Smoking status: Never Smoker  . Smokeless tobacco: Never Used  . Alcohol use No    Family History  Problem Relation Age of Onset  . Diabetes Father   . Heart disease Father   . Diabetes Brother   . OCD Mother     Allergies  Allergen Reactions  . Metformin And Related Rash     Current Outpatient Prescriptions:  .  acetaminophen (TYLENOL) 500 MG tablet, Take 1,000 mg by mouth every 8 (eight) hours as needed for mild pain or moderate pain., Disp: , Rfl:  .  allopurinol (ZYLOPRIM) 300 MG tablet, TAKE 1 TABLET BY MOUTH EVERY DAY, Disp: 90 tablet, Rfl: 3 .  apixaban (ELIQUIS) 5 MG TABS tablet, Take 5 mg by mouth 2 (two) times daily., Disp: , Rfl:  .  benztropine (COGENTIN) 1 MG tablet, Take 1 tablet (1 mg total) by mouth daily., Disp: 30 tablet, Rfl: 2 .  DEPAKOTE ER 250 MG 24 hr tablet, Take 1 tab in am and 2 at bed time, Disp: 90 tablet, Rfl: 0 .  desonide (DESOWEN) 0.05 % cream, Apply 1 application topically daily as needed (dry skin)., Disp: , Rfl:  .   ketoconazole (NIZORAL) 2 % shampoo, Apply 1 application topically 2 (two) times a week., Disp: 120 mL, Rfl: 0 .  levothyroxine (SYNTHROID, LEVOTHROID) 100 MCG tablet, Take 1 tablet (100 mcg total) by mouth daily., Disp: 90 tablet, Rfl: 3 .  Omega-3 Fatty Acids (FISH OIL) 1200 MG CPDR, Take by mouth., Disp: , Rfl:  .  perphenazine (TRILAFON) 8 MG tablet, TAKE 3 TABLETS AT BEDTIME, Disp: 90 tablet, Rfl: 0 .  phenelzine (NARDIL) 15 MG tablet, TAKE 3 TABLETS TWICE A DAY, Disp: 180 tablet, Rfl: 0 .  silver sulfADIAZINE (SILVADENE) 1 % cream, Apply 1 application topically daily as needed (for rash)., Disp: , Rfl:  .  metFORMIN (GLUCOPHAGE-XR) 500 MG 24 hr tablet, Take 2 tablets (1,000 mg total) by mouth daily with breakfast. (Patient not taking: Reported on 02/03/2016), Disp: 60 tablet, Rfl: 3 .  polyethylene glycol powder (GLYCOLAX/MIRALAX) powder, Take 17 g by mouth 2 (two) times daily. Until daily soft stools  OTC (Patient not taking: Reported on 02/03/2016), Disp: 225 g, Rfl: 0  Vitals:   02/03/16 1508  BP: 121/82  Pulse: 90  Resp: 16  Temp: 97 F (36.1 C)  SpO2: 93%  Weight: 267 lb (121.1 kg)  Height: 5\' 10"  (1.778  m)    Body mass index is 38.31 kg/m.          Review of Systems denies chest pain. Does have occasional dyspnea related to A. fib. Takes Eliquis daily. Also has history of morbid obesity. Has type 2 diabetes mellitus. Has lateralizing weakness, aphasia, amaurosis fugax, syncope. Has history of seizure disorder since 1985. Other systems negative and complete review of systems     Objective:   Physical Exam BP 121/82 (BP Location: Left Arm, Patient Position: Sitting, Cuff Size: Large)   Pulse 90   Temp 97 F (36.1 C)   Resp 16   Ht 5\' 10"  (1.778 m)   Wt 267 lb (121.1 kg)   SpO2 93%   BMI 38.31 kg/m     Gen.-alert and oriented x3 in no apparent distress-morbidly obese HEENT normal for age Lungs no rhonchi or wheezing Cardiovascular regular rhythm no murmurs  carotid pulses 3+ palpable no bruits audible Abdomen soft nontender no palpable masses-obese Musculoskeletal free of  major deformities Skin clear -no rashes Neurologic normal Lower extremities 3+ femoral and dorsalis pedis pulses palpable bilaterally with 1+ edema. Right leg with bulging varicosities beginning in the mid thigh medially over the great saphenous system extending into the calf down near the medial malleolus. Left leg with bulging varicosities distal thigh and calf over great saphenous system. No hyperpigmentation or ulceration noted bilaterally.  Today I performed a bedside venous ultrasound sono site exam. Patient has large caliber great saphenous veins bilaterally with gross reflux.       Assessment:     Painful varicosities bilaterally right worse than left with gross reflux bilateral great saphenous veins causing symptoms which are affecting patient's daily living History of seizure disorder History of atrial fibrillation currently on Eliquis Hypertension Morbid obesity    Plan:     Patient is scheduled for formal venous duplex exam in 2 weeks     #1 long leg elastic compression stockings 20-30 mm gradient #2 elevate legs as much as possible #3 ibuprofen daily on a regular basis for pain #4 return in 3 months-if no significant improvement then he will need laser ablation right great saphenous vein with greater than 20 stab phlebectomy of painful varicosities. We will also make recommendations regarding left leg depending on findings of venous ultrasound

## 2016-02-09 ENCOUNTER — Other Ambulatory Visit (HOSPITAL_COMMUNITY): Payer: Self-pay | Admitting: Psychiatry

## 2016-02-09 DIAGNOSIS — F330 Major depressive disorder, recurrent, mild: Secondary | ICD-10-CM

## 2016-02-10 ENCOUNTER — Other Ambulatory Visit (HOSPITAL_COMMUNITY): Payer: Self-pay

## 2016-02-10 DIAGNOSIS — F330 Major depressive disorder, recurrent, mild: Secondary | ICD-10-CM

## 2016-02-10 MED ORDER — DEPAKOTE ER 250 MG PO TB24: ORAL_TABLET | ORAL | 0 refills | Status: DC

## 2016-02-19 ENCOUNTER — Ambulatory Visit (HOSPITAL_COMMUNITY)
Admission: RE | Admit: 2016-02-19 | Discharge: 2016-02-19 | Disposition: A | Discharge status: Home / Self Care | Payer: Medicare Other | Source: Ambulatory Visit | Attending: Vascular Surgery | Admitting: Vascular Surgery

## 2016-02-19 ENCOUNTER — Encounter: Payer: Self-pay | Admitting: Vascular Surgery

## 2016-02-19 DIAGNOSIS — I83813 Varicose veins of bilateral lower extremities with pain: Secondary | ICD-10-CM | POA: Diagnosis not present

## 2016-02-19 DIAGNOSIS — E079 Disorder of thyroid, unspecified: Secondary | ICD-10-CM | POA: Diagnosis not present

## 2016-02-19 DIAGNOSIS — R609 Edema, unspecified: Secondary | ICD-10-CM | POA: Diagnosis present

## 2016-02-19 DIAGNOSIS — F329 Major depressive disorder, single episode, unspecified: Secondary | ICD-10-CM | POA: Insufficient documentation

## 2016-02-19 DIAGNOSIS — I10 Essential (primary) hypertension: Secondary | ICD-10-CM | POA: Diagnosis not present

## 2016-02-26 DIAGNOSIS — L820 Inflamed seborrheic keratosis: Secondary | ICD-10-CM | POA: Diagnosis not present

## 2016-02-26 DIAGNOSIS — D1801 Hemangioma of skin and subcutaneous tissue: Secondary | ICD-10-CM | POA: Diagnosis not present

## 2016-02-26 DIAGNOSIS — L304 Erythema intertrigo: Secondary | ICD-10-CM | POA: Diagnosis not present

## 2016-02-26 DIAGNOSIS — L219 Seborrheic dermatitis, unspecified: Secondary | ICD-10-CM | POA: Diagnosis not present

## 2016-02-26 DIAGNOSIS — L250 Unspecified contact dermatitis due to cosmetics: Secondary | ICD-10-CM | POA: Diagnosis not present

## 2016-03-02 ENCOUNTER — Other Ambulatory Visit (HOSPITAL_COMMUNITY): Payer: Self-pay | Admitting: Psychiatry

## 2016-03-02 ENCOUNTER — Ambulatory Visit (HOSPITAL_COMMUNITY): Payer: Self-pay | Admitting: Psychiatry

## 2016-03-02 DIAGNOSIS — F330 Major depressive disorder, recurrent, mild: Secondary | ICD-10-CM

## 2016-03-03 ENCOUNTER — Other Ambulatory Visit (HOSPITAL_COMMUNITY): Payer: Self-pay | Admitting: Psychiatry

## 2016-03-03 DIAGNOSIS — F330 Major depressive disorder, recurrent, mild: Secondary | ICD-10-CM

## 2016-03-05 ENCOUNTER — Other Ambulatory Visit (HOSPITAL_COMMUNITY): Payer: Self-pay

## 2016-03-05 DIAGNOSIS — F330 Major depressive disorder, recurrent, mild: Secondary | ICD-10-CM

## 2016-03-05 MED ORDER — DEPAKOTE ER 250 MG PO TB24
ORAL_TABLET | ORAL | 0 refills | Status: DC
Start: 2016-03-05 — End: 2016-03-13

## 2016-03-05 MED ORDER — PERPHENAZINE 8 MG PO TABS: 24.0000 mg | ORAL_TABLET | Freq: Every day | ORAL | 0 refills | Status: DC

## 2016-03-13 ENCOUNTER — Other Ambulatory Visit (HOSPITAL_COMMUNITY): Payer: Self-pay

## 2016-03-13 DIAGNOSIS — F330 Major depressive disorder, recurrent, mild: Secondary | ICD-10-CM

## 2016-03-13 MED ORDER — DEPAKOTE ER 250 MG PO TB24: ORAL_TABLET | ORAL | 0 refills | Status: DC

## 2016-03-15 ENCOUNTER — Other Ambulatory Visit (HOSPITAL_COMMUNITY): Payer: Self-pay | Admitting: Psychiatry

## 2016-03-15 DIAGNOSIS — F330 Major depressive disorder, recurrent, mild: Secondary | ICD-10-CM

## 2016-03-16 ENCOUNTER — Other Ambulatory Visit (HOSPITAL_COMMUNITY): Payer: Self-pay

## 2016-03-16 DIAGNOSIS — F330 Major depressive disorder, recurrent, mild: Secondary | ICD-10-CM

## 2016-03-16 MED ORDER — BENZTROPINE MESYLATE 1 MG PO TABS
1.0000 mg | ORAL_TABLET | Freq: Every day | ORAL | 0 refills | Status: DC
Start: 2016-03-16 — End: 2016-03-30

## 2016-03-29 NOTE — Progress Notes (Signed)
[place] progress Note   [name] [id number] 55 y.o.  03/29/2016 9:38 PM  Chief Complaint:  Medication management and follow-up.    Patient states that he has been feeling depressed and irritable for the past month. Although he could not think of any triggers which caused him stress, he talked about a break up with a girl about a month ago. He thinks that there is worsening in his "OCD" which includes repeated walking. He feels hopeless that although he has had tried a multiple medication in the past, those do not seem to work well for him. He is interested in trying any new medication. He has been planning to restart bowling which she used to enjoy. He tries to go outside and be with his friends. He is not interested in therapy, stating that he has learned skills he needs to use.    He denies insomnia and sleep 12-16 hours per day. He reports low energy. He has occasional fleeting SI of taking pills, but adamantly denies any intention. He denies any previous suicide attempt. He has had tremors for years and denies any worsening in his symptoms. He denies any alcohol/drug use.  Past Psychiatric History/Hospitalization(s) Per Dr. Marguerite Olea note "Patient has multiple hospitalizations from [phone] .  He remember mostly he was admitted due to severe depression and having suicidal thoughts with negative thinking but denies any suicidal attempt.  He was diagnosed with major depressive disorder, schizoaffective disorder and OCD.  He admitted history of somewhat paranoia and intrusive thoughts in the past.  Since he is taking his current medication more than 35 years ago he is been pretty stable and is happy about it.  He had tried numerous psychotherapy medication mostly TCAs.  He followed outpatient services with a psychiatrist in [place] Dr. Danny Lawless, who retired couple of years ago so he started with [facility] outpatient [facility] but is not satisfied and wants to change  this provider.  His last psychiatrist was Dr. De Nurse in [place]."  Hospitalization for psychiatric illness: Yes History of Electroconvulsive Shock Therapy: No Prior Suicide Attempts: No  Medical History; Past Medical History:  Diagnosis Date  . A-fib ([place])   . Arrhythmia   . Depression   . Depression   . Hypertension   . Low back pain   . Morbid obesity, BMI unknown ([place])   . Osteoarthritis    oa in bilateral knees  . Seizures ([place])   . Thyroid disease   . Varicose veins     Allergies: Allergies  Allergen Reactions  . Metformin And Related Rash    Medications: Outpatient Encounter Prescriptions as of 03/30/2016  Medication Sig  . acetaminophen (TYLENOL) 500 MG tablet Take 1,000 mg by mouth every 8 (eight) hours as needed for mild pain or moderate pain.  Marland Kitchen allopurinol (ZYLOPRIM) 300 MG tablet TAKE 1 TABLET BY MOUTH EVERY DAY  . apixaban (ELIQUIS) 5 MG TABS tablet Take 5 mg by mouth 2 (two) times daily.  . benztropine (COGENTIN) 1 MG tablet Take 1 tablet (1 mg total) by mouth daily.  Marland Kitchen DEPAKOTE ER 250 MG 24 hr tablet Take 1 tab in am and 2 at bed time or as directed.  . desonide (DESOWEN) 0.05 % cream Apply 1 application topically daily as needed (dry skin).  Marland Kitchen ketoconazole (NIZORAL) 2 % shampoo Apply 1 application topically 2 (two) times a week.  . levothyroxine (SYNTHROID, LEVOTHROID) 100 MCG tablet Take 1 tablet (100 mcg total) by mouth daily.  Marland Kitchen  metFORMIN (GLUCOPHAGE-XR) 500 MG 24 hr tablet Take 2 tablets (1,000 mg total) by mouth daily with breakfast. (Patient not taking: Reported on 02/03/2016)  . Omega-3 Fatty Acids (FISH OIL) 1200 MG CPDR Take by mouth.  . perphenazine (TRILAFON) 8 MG tablet Take 3 tablets (24 mg total) by mouth at bedtime.  . phenelzine (NARDIL) 15 MG tablet TAKE 3 TABLETS TWICE A DAY  . polyethylene glycol powder (GLYCOLAX/MIRALAX) powder Take 17 g by mouth 2 (two) times daily. Until daily soft stools  OTC (Patient not taking: Reported on  02/03/2016)  . silver sulfADIAZINE (SILVADENE) 1 % cream Apply 1 application topically daily as needed (for rash).   No facility-administered encounter medications on file as of 03/30/2016.    Past Medical History:  Diagnosis Date  . A-fib ([place])   . Arrhythmia   . Depression   . Depression   . Hypertension   . Low back pain   . Morbid obesity, BMI unknown ([place])   . Osteoarthritis    oa in bilateral knees  . Seizures ([place])   . Thyroid disease   . Varicose veins      Substance Abuse History: Denies any use of alcohol peers there is no history suggestive of substance abuse or alcoholism.  Family History; Family History  Problem Relation Age of Onset  . Diabetes Father   . Heart disease Father   . Diabetes Brother   . OCD Mother    Labs:  No results found for this or any previous visit (from the past 2160 hour(s)).     Musculoskeletal: Strength & Muscle Tone: within normal limits Gait & Station: normal Patient leans: N/A  Mental Status Examination;   Psychiatric Specialty Exam: Physical Exam  Constitutional: He appears well-developed.  HENT:  Head: Normocephalic and atraumatic.  Skin: He is not diaphoretic.    Review of Systems  Cardiovascular: Negative for chest pain and palpitations.  Gastrointestinal: Negative for heartburn.  Skin: Negative for itching and rash.  Neurological: Positive for tremors. Negative for dizziness, tingling and headaches.  Psychiatric/Behavioral: Positive for depression. Negative for hallucinations, substance abuse and suicidal ideas. The patient is not nervous/anxious and does not have insomnia.     There were no vitals taken for this visit.There is no height or weight on file to calculate BMI.  General Appearance: Casual, obese  Eye Contact::  Fair  Speech:  Slow  Volume:  Normal  Mood:  Depressed  Affect:  Congruent  Thought Process:  Coherent and Linear Perceptions: denies AH/VH  Orientation:  Full (Time, Place, and Person)    Thought Content:  WDL denies paranoia  Suicidal Thoughts:  Yes.  without intent/plan (reports fleeting SI of taking pills, denies any intent)  Homicidal Thoughts:  No  Memory:  Immediate;   Fair Recent;   Fair  Judgement:  Fair  Insight:  Fair  Psychomotor Activity:  Normal  Concentration:  Fair  Recall:  Fair  Akathisia:  No   Handed:  Right  AIMS (if indicated):    + resting and postural tremors, no rigidity  Assets:  Desire for Improvement Financial Resources/Insurance Vocational/Educational  Sleep:   good     Assessment: [name] is a 55 year old male with depression, charted history of Schizoaffective disorder (as per history -suggestive of paranoia and obsessive thoughts of injuring people in remote past), OCD, hypertension, low back pain, seizure, varicose veins, Afib who presents for follow up. Patient is one of Dr. Darletta Moll' patients.  # MDD #  OCD Patient reports worsening depression, irritability and compulsion of walking in the setting of breaking up with his girlfriend (although he denies this as a source of stress). Today's exam is notable that his affect reactivity is well preserved despite his subjective symptoms, and he is committed to behavioral activation as well. Having discussed pros and cons of changing medication, we have decided to stay on the current regimen until he sees Dr. Adele Schilder. Noted that although discussed options of decreasing perphenazine given his resting tremors, he declines this option and prefers to continue this medication.   Plans - Continue current medication; Trilafon 24 mg at bedtime, Cogentin 1 mg at bedtime, Depakote 250 mg in the morning and 500 mg at bedtime and Nardil 45 mg twice a day - Return to clinic in two months with Dr. Adele Schilder  The patient demonstrates the following  risk factors for suicide: Chronic risk factors for suicide include psychiatric disorder /depression, OCD, demographic factors (male). Acute risk factors for  suicide include conflict with his girlfriend, unemployment.   Protective factors for this patient include coping skills, hope for the future. Although patient reports fleeting SI, he denies any intent, previous attempt. He is future oriented and seeking for help. Considering these factors, the overall suicide risk at this point appears to be low. Discussed calling the clinic/emergency resources if any worsening in his symptoms.  Norman Clay, MD 03/29/2016

## 2016-03-30 ENCOUNTER — Other Ambulatory Visit (HOSPITAL_COMMUNITY): Payer: Self-pay | Admitting: Psychiatry

## 2016-03-30 ENCOUNTER — Ambulatory Visit (INDEPENDENT_AMBULATORY_CARE_PROVIDER_SITE_OTHER): Payer: Medicare Other | Admitting: Psychiatry

## 2016-03-30 DIAGNOSIS — R45851 Suicidal ideations: Secondary | ICD-10-CM | POA: Diagnosis not present

## 2016-03-30 DIAGNOSIS — F330 Major depressive disorder, recurrent, mild: Secondary | ICD-10-CM

## 2016-03-30 MED ORDER — PERPHENAZINE 8 MG PO TABS: 24.0000 mg | ORAL_TABLET | Freq: Every day | ORAL | 2 refills | Status: DC

## 2016-03-30 MED ORDER — DEPAKOTE ER 250 MG PO TB24: ORAL_TABLET | ORAL | 2 refills | Status: DC

## 2016-03-30 MED ORDER — PHENELZINE SULFATE 15 MG PO TABS: 45.0000 mg | ORAL_TABLET | Freq: Two times a day (BID) | ORAL | 2 refills | Status: DC

## 2016-03-30 MED ORDER — BENZTROPINE MESYLATE 1 MG PO TABS: 1.0000 mg | ORAL_TABLET | Freq: Every day | ORAL | 2 refills | Status: DC

## 2016-03-30 NOTE — Patient Instructions (Signed)
1. Continue current medication 2. Return to clinic in two months with Dr. Adele Schilder

## 2016-04-05 ENCOUNTER — Other Ambulatory Visit: Payer: Self-pay | Admitting: Family Medicine

## 2016-04-06 ENCOUNTER — Other Ambulatory Visit (HOSPITAL_COMMUNITY): Payer: Self-pay | Admitting: Psychiatry

## 2016-04-06 DIAGNOSIS — F330 Major depressive disorder, recurrent, mild: Secondary | ICD-10-CM

## 2016-04-06 NOTE — Telephone Encounter (Signed)
Rx filled.  Algis Greenhouse. Jerline Pain, [facility] Resident PGY-3 04/06/2016 11:51 AM

## 2016-04-16 ENCOUNTER — Encounter (HOSPITAL_COMMUNITY): Payer: Self-pay | Admitting: Psychiatry

## 2016-04-16 ENCOUNTER — Ambulatory Visit (INDEPENDENT_AMBULATORY_CARE_PROVIDER_SITE_OTHER): Payer: Medicare Other | Admitting: Psychiatry

## 2016-04-16 VITALS — BP 128/76 | HR 80 | Ht 70.5 in | Wt 266.5 lb

## 2016-04-16 DIAGNOSIS — Z818 Family history of other mental and behavioral disorders: Secondary | ICD-10-CM | POA: Diagnosis not present

## 2016-04-16 DIAGNOSIS — F330 Major depressive disorder, recurrent, mild: Secondary | ICD-10-CM | POA: Diagnosis not present

## 2016-04-16 DIAGNOSIS — Z8249 Family history of ischemic heart disease and other diseases of the circulatory system: Secondary | ICD-10-CM | POA: Diagnosis not present

## 2016-04-16 DIAGNOSIS — Z79899 Other long term (current) drug therapy: Secondary | ICD-10-CM

## 2016-04-16 DIAGNOSIS — Z833 Family history of diabetes mellitus: Secondary | ICD-10-CM | POA: Diagnosis not present

## 2016-04-16 DIAGNOSIS — Z791 Long term (current) use of non-steroidal anti-inflammatories (NSAID): Secondary | ICD-10-CM

## 2016-04-16 NOTE — Progress Notes (Signed)
**Note Anthony-Identified via Obfuscation** [place] progress Note   [name] [id number] 55 y.o.  04/16/2016 2:19 PM  Chief Complaint:  I still feel sad and depressed.  Sometimes I cry .      History of Present Illness:  [name] came for his follow-up appointment.  He was last seen by Dr. Armandina Trujillo in September but he reluctant to change his medication.  He ruminates about his breakup from his girlfriend.  He endorse some nights he cry and feel sad and depressed.  We have discussed few times changing his medication but he is reluctant to change his medication.  Patient has significant past psychiatric history and multiple hospitalization.  He had tried numerous antidepressant .  Though he denies any suicidal thoughts or homicidal thought but feel sometime hopeless helpless and worthless.  He feels isolated and withdrawn.  We talk about [name] and he like to get an evaluation for [place] treatment.  In the meantime he like to continue his current psychiatric medication.  Patient denies any paranoia or any hallucination.  His energy level is fair.  His appetite is okay.  His vital signs are stable.  Past Psychiatric History/Hospitalization(s) Patient has multiple hospitalizations from [phone] .  He remember mostly he was admitted due to severe depression and having suicidal thoughts with negative thinking but denies any suicidal attempt.  He was diagnosed with major depressive disorder, schizoaffective disorder and OCD.  He admitted history of somewhat paranoia and intrusive thoughts in the past.  Since he is taking his current medication more than 35 years ago he is been pretty stable and is happy about it.  He had tried numerous psychotherapy medication mostly TCAs.  He followed outpatient services with a psychiatrist in [place] Dr. Danny Trujillo, who retired couple of years ago so he started with [facility] outpatient clinic but is not satisfied and wants to change this provider.  His last psychiatrist was Dr. De Trujillo in  [place].  Hospitalization for psychiatric illness: Yes History of Electroconvulsive Shock Therapy: No Prior Suicide Attempts: No  Medical History; Past Medical History:  Diagnosis Date  . A-fib ([place])   . Arrhythmia   . Depression   . Depression   . Hypertension   . Low back pain   . Morbid obesity, BMI unknown ([place])   . Osteoarthritis    oa in bilateral knees  . Seizures ([place])   . Thyroid disease   . Varicose veins     Allergies: Allergies  Allergen Reactions  . Metformin And Related Rash    Medications: Outpatient Encounter Prescriptions as of 04/16/2016  Medication Sig  . acetaminophen (TYLENOL) 500 MG tablet Take 1,000 mg by mouth every 8 (eight) hours as needed for mild pain or moderate pain.  Marland Kitchen allopurinol (ZYLOPRIM) 300 MG tablet TAKE 1 TABLET BY MOUTH EVERY DAY  . benztropine (COGENTIN) 1 MG tablet Take 1 tablet (1 mg total) by mouth daily.  Marland Kitchen DEPAKOTE ER 250 MG 24 hr tablet Take 1 tab in am and 2 at bed time or as directed.  . desonide (DESOWEN) 0.05 % cream Apply 1 application topically daily as needed (dry skin).  Marland Kitchen ELIQUIS 5 MG TABS tablet TAKE 1 TABLET BY MOUTH TWICE A DAY  . ketoconazole (NIZORAL) 2 % shampoo Apply 1 application topically 2 (two) times a week.  . levothyroxine (SYNTHROID, LEVOTHROID) 100 MCG tablet Take 1 tablet (100 mcg total) by mouth daily.  . metFORMIN (GLUCOPHAGE-XR) 500 MG 24 hr tablet Take 2 tablets (1,000 mg  total) by mouth daily with breakfast. (Patient not taking: Reported on 02/03/2016)  . Omega-3 Fatty Acids (FISH OIL) 1200 MG CPDR Take by mouth.  . perphenazine (TRILAFON) 8 MG tablet Take 3 tablets (24 mg total) by mouth at bedtime.  . phenelzine (NARDIL) 15 MG tablet Take 3 tablets (45 mg total) by mouth 2 (two) times daily.  . polyethylene glycol powder (GLYCOLAX/MIRALAX) powder Take 17 g by mouth 2 (two) times daily. Until daily soft stools  OTC (Patient not taking: Reported on 02/03/2016)  . silver sulfADIAZINE  (SILVADENE) 1 % cream Apply 1 application topically daily as needed (for rash).   No facility-administered encounter medications on file as of 04/16/2016.      Substance Abuse History: Denies any use of alcohol peers there is no history suggestive of substance abuse or alcoholism.  Family History; Family History  Problem Relation Age of Onset  . Diabetes Father   . Heart disease Father   . Diabetes Brother   . OCD Mother    Labs:  No results found for this or any previous visit (from the past 2160 hour(s)).     Musculoskeletal: Strength & Muscle Tone: within normal limits Gait & Station: normal Patient leans: N/A  Mental Status Examination;   Psychiatric Specialty Exam: Physical Exam  Constitutional: He appears well-developed.  HENT:  Head: Normocephalic and atraumatic.  Skin: He is not diaphoretic.    Review of Systems  Cardiovascular: Negative for chest pain and palpitations.  Gastrointestinal: Negative for heartburn.  Skin: Negative for itching and rash.  Neurological: Negative for dizziness, tingling, tremors and headaches.  Psychiatric/Behavioral: Negative for substance abuse. The patient is not nervous/anxious.     Blood pressure 128/76, pulse 80, height 5' 10.5" (1.791 m), weight 266 lb 8 oz (120.9 kg).Body mass index is 37.7 kg/m.  General Appearance: Casual, obese  Eye Contact::  Fair  Speech:  Slow  Volume:  Normal  Mood:  Euthymic and Tired  Affect:  Congruent  Thought Process:  Coherent and Linear  Orientation:  Full (Time, Place, and Person)  Thought Content:  WDL  Suicidal Thoughts:  No  Homicidal Thoughts:  No  Memory:  Immediate;   Fair Recent;   Fair  Judgement:  Fair  Insight:  Fair  Psychomotor Activity:  Normal  Concentration:  Fair  Recall:  Fair  Akathisia:  Negative  Handed:  Right  AIMS (if indicated):     Assets:  Desire for Improvement Financial Resources/Insurance Vocational/Educational  Sleep:         Assessment: Axis I: Major depressive disorder, recurrent moderate (stable on meds). R/o Schizoaffective disorder (as per history -suggestive of paranoia and obsessive toughts of injuring people in remote past), OCD per history (Nardil has stablized these symptoms)  Axis II: deffered  Axis III:  Past Medical History:  Diagnosis Date  . A-fib ([place])   . Arrhythmia   . Depression   . Depression   . Hypertension   . Low back pain   . Morbid obesity, BMI unknown ([place])   . Osteoarthritis    oa in bilateral knees  . Seizures ([place])   . Thyroid disease   . Varicose veins     Treatment Plan and Summary: Discuss treatment plan.  Patient like to try [place] and we will schedule appointment with Dr. Lovena Le .  In the meantime he will continue his current psychiatric medication.  He has no side effects.  I will continue Trilafon 24 mg at bedtime, Cogentin 1  mg at bedtime, Depakote 250 mg in the morning and 500 mg at bedtime and Nardil 45 mg twice a day. He is well aware about his medication side effects especially dietary restrictions with Nardil.  Patient has not seen Tharon Aquas in a while and he is not interested in counseling.  He will see Dr. Lovena Le for [place] evaluation and return to see me in 2 months. Recommended to call us back if he has any question or any concern.  Discuss safety plan that anytime having active suicidal thoughts or homicidal thoughts then patient need to call 911 or go to the local emergency room.     Cheral Cappucci T., MD 04/16/2016 Patient ID: [name], male   DOB: Mar 27, 1961, 55 y.o.   MRN: [id number]

## 2016-05-07 DIAGNOSIS — R7303 Prediabetes: Secondary | ICD-10-CM | POA: Diagnosis not present

## 2016-05-07 DIAGNOSIS — E221 Hyperprolactinemia: Secondary | ICD-10-CM | POA: Diagnosis not present

## 2016-05-07 DIAGNOSIS — N529 Male erectile dysfunction, unspecified: Secondary | ICD-10-CM | POA: Diagnosis not present

## 2016-05-08 ENCOUNTER — Encounter: Payer: Self-pay | Admitting: Vascular Surgery

## 2016-05-12 ENCOUNTER — Encounter: Payer: Self-pay | Admitting: Vascular Surgery

## 2016-05-12 ENCOUNTER — Other Ambulatory Visit: Payer: Self-pay | Admitting: *Deleted

## 2016-05-12 ENCOUNTER — Ambulatory Visit (INDEPENDENT_AMBULATORY_CARE_PROVIDER_SITE_OTHER): Payer: Medicare Other | Admitting: Vascular Surgery

## 2016-05-12 VITALS — BP 127/86 | HR 82 | Temp 98.4°F | Resp 16 | Ht 70.5 in | Wt 266.4 lb

## 2016-05-12 DIAGNOSIS — I83893 Varicose veins of bilateral lower extremities with other complications: Secondary | ICD-10-CM

## 2016-05-12 NOTE — Progress Notes (Signed)
Subjective:     Patient ID: [name], male   DOB: [birth year], 55 y.o.   MRN: [id number]  HPI This 55 year old male returns for continued follow-up regarding his painful varicosities in both lower extremities. He is tried long-leg elastic compression stockings 20-30 millimeter gradient as well as elevation and ibuprofen but continues to have aching throbbing and burning discomfort in both legs. Right is worse than the left. He does develops swelling is a day progresses. This is affecting his daily living and he would like treatment.  Past Medical History:  Diagnosis Date  . A-fib ([place])   . Arrhythmia   . Depression   . Depression   . Hypertension   . Low back pain   . Morbid obesity, BMI unknown ([place])   . Osteoarthritis    oa in bilateral knees  . Seizures ([place])   . Thyroid disease   . Varicose veins     Social History  Substance Use Topics  . Smoking status: Never Smoker  . Smokeless tobacco: Never Used  . Alcohol use No    Family History  Problem Relation Age of Onset  . Diabetes Father   . Heart disease Father   . Diabetes Brother   . OCD Mother     Allergies  Allergen Reactions  . Metformin And Related Rash     Current Outpatient Prescriptions:  .  acetaminophen (TYLENOL) 500 MG tablet, Take 1,000 mg by mouth every 8 (eight) hours as needed for mild pain or moderate pain., Disp: , Rfl:  .  allopurinol (ZYLOPRIM) 300 MG tablet, TAKE 1 TABLET BY MOUTH EVERY DAY, Disp: 90 tablet, Rfl: 3 .  benztropine (COGENTIN) 1 MG tablet, Take 1 tablet (1 mg total) by mouth daily., Disp: 30 tablet, Rfl: 2 .  DEPAKOTE ER 250 MG 24 hr tablet, Take 1 tab in am and 2 at bed time or as directed., Disp: 90 tablet, Rfl: 2 .  desonide (DESOWEN) 0.05 % cream, Apply 1 application topically daily as needed (dry skin)., Disp: , Rfl:  .  ELIQUIS 5 MG TABS tablet, TAKE 1 TABLET BY MOUTH TWICE A DAY, Disp: 60 tablet, Rfl: 11 .  ketoconazole (NIZORAL) 2 % shampoo, Apply 1 application  topically 2 (two) times a week., Disp: 120 mL, Rfl: 0 .  levothyroxine (SYNTHROID, LEVOTHROID) 100 MCG tablet, Take 1 tablet (100 mcg total) by mouth daily., Disp: 90 tablet, Rfl: 3 .  Omega-3 Fatty Acids (FISH OIL) 1200 MG CPDR, Take by mouth., Disp: , Rfl:  .  perphenazine (TRILAFON) 8 MG tablet, Take 3 tablets (24 mg total) by mouth at bedtime., Disp: 90 tablet, Rfl: 2 .  phenelzine (NARDIL) 15 MG tablet, Take 3 tablets (45 mg total) by mouth 2 (two) times daily., Disp: 180 tablet, Rfl: 2 .  silver sulfADIAZINE (SILVADENE) 1 % cream, Apply 1 application topically daily as needed (for rash)., Disp: , Rfl:  .  metFORMIN (GLUCOPHAGE-XR) 500 MG 24 hr tablet, Take 2 tablets (1,000 mg total) by mouth daily with breakfast. (Patient not taking: Reported on 05/12/2016), Disp: 60 tablet, Rfl: 3 .  polyethylene glycol powder (GLYCOLAX/MIRALAX) powder, Take 17 g by mouth 2 (two) times daily. Until daily soft stools  OTC (Patient not taking: Reported on 05/12/2016), Disp: 225 g, Rfl: 0  Vitals:   05/12/16 1027  BP: 127/86  Pulse: 82  Resp: 16  Temp: 98.4 F (36.9 C)  TempSrc: Oral  SpO2: 92%  Weight: 266 lb 6.4 oz (120.8 kg)  Height: 5' 10.5" (1.791 m)    Body mass index is 37.68 kg/m.         Review of Systems  denies chest pain, dyspnea on exertion, PND, orthopnea. Patient does have history of A. fib and is on Eliquis. Also has history of depression and is due to have procedure performed after the first of the year Objective:   Physical Exam BP 127/86 (BP Location: Left Arm, Patient Position: Sitting, Cuff Size: Normal)   Pulse 82   Temp 98.4 F (36.9 C) (Oral)   Resp 16   Ht 5' 10.5" (1.791 m)   Wt 266 lb 6.4 oz (120.8 kg)   SpO2 92%   BMI 37.68 kg/m   Gen. well-developed well-nourished male no apparent stress alert and oriented 3 Lungs no rhonchi or wheezing Both legs with bulging varicosities in the medial thigh and calf over the great saphenous vein with right worse than  left. 1+ edema distally bilaterally. 3+ dorsalis pedis pulse palpable.  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 Chronic atrial fibrillation on chronic anticoagulation History of depression    Plan:     Will schedule right laser ablation great saphenous vein in the near future to be followed by similar procedure on the left leg. Patient will then return in 3 months for evaluation to determine if stab phlebectomy will be indicated for secondary varicosities We will need to discontinue Eliquis 3 days preoperatively and then resume 2-3 days post procedure We'll schedule this leg in the near future

## 2016-05-14 ENCOUNTER — Ambulatory Visit (INDEPENDENT_AMBULATORY_CARE_PROVIDER_SITE_OTHER): Payer: Medicare Other | Admitting: Psychiatry

## 2016-05-14 ENCOUNTER — Encounter (HOSPITAL_COMMUNITY): Payer: Self-pay | Admitting: Psychiatry

## 2016-05-14 VITALS — BP 126/76 | HR 85 | Ht 70.5 in | Wt 269.2 lb

## 2016-05-14 DIAGNOSIS — Z79899 Other long term (current) drug therapy: Secondary | ICD-10-CM

## 2016-05-14 DIAGNOSIS — F330 Major depressive disorder, recurrent, mild: Secondary | ICD-10-CM | POA: Diagnosis not present

## 2016-05-14 MED ORDER — LAMOTRIGINE 25 MG PO TABS: ORAL_TABLET | ORAL | 0 refills | Status: DC

## 2016-05-14 NOTE — Progress Notes (Signed)
**Note Anthony-Identified via Obfuscation** [place] progress Note   [name] [id number] 55 y.o.  05/14/2016 3:22 PM  Chief Complaint:  I want to try a different medication.  I have and depressed.  Yesterday I felt very hopeless.        History of Present Illness:  [name] came earlier than his is scheduled appointment.  He is taking his medication but sometime he feel very hopeless.  He was very disappointed because he could not qualify for [place] treatment due to his history of seizures.  History he felt very hopeless helpless but denies any suicidal thoughts.  He does not want to stop his medication but is Nardil, Trilafon because it has been working very well.  However he is willing to add something to help his mood.  Though he denies any anger, irritability, severe mood swing, hallucination, paranoia but endorsed lack of motivation, anhedonia, some time crying spells and withdrawn.  He sleeping good.  He denies any drinking or using any illegal substances.  He admitted that he gets episodes of severe depression mostly in wintertime and since day time saving he is been expensing more depression.  He has no side effects from medication.  He's taking Depakote and his last Depakote level was 61.  His appetite is okay.  His vital signs are stable.  Past Psychiatric History/Hospitalization(s) Patient has multiple hospitalizations from [phone] .  He remember mostly he was admitted due to severe depression and having suicidal thoughts with negative thinking but denies any suicidal attempt.  He was diagnosed with major depressive disorder, schizoaffective disorder and OCD.  He admitted history of somewhat paranoia and intrusive thoughts in the past.  Since he is taking his current medication more than 35 years ago he is been pretty stable and is happy about it.  He had tried numerous psychotherapy medication mostly TCAs.  He followed outpatient services with a psychiatrist in [place] Dr. Danny Trujillo, who retired couple  of years ago so he started with [facility] outpatient clinic but is not satisfied and wants to change this provider.  His last psychiatrist was Dr. De Trujillo in [place].  Hospitalization for psychiatric illness: Yes History of Electroconvulsive Shock Therapy: No Prior Suicide Attempts: No  Medical History; Past Medical History:  Diagnosis Date  . A-fib ([place])   . Arrhythmia   . Depression   . Depression   . Hypertension   . Low back pain   . Morbid obesity, BMI unknown ([place])   . Osteoarthritis    oa in bilateral knees  . Seizures ([place])   . Thyroid disease   . Varicose veins     Allergies: Allergies  Allergen Reactions  . Metformin And Related Rash    Medications: Outpatient Encounter Prescriptions as of 05/14/2016  Medication Sig  . acetaminophen (TYLENOL) 500 MG tablet Take 1,000 mg by mouth every 8 (eight) hours as needed for mild pain or moderate pain.  Marland Kitchen allopurinol (ZYLOPRIM) 300 MG tablet TAKE 1 TABLET BY MOUTH EVERY DAY  . benztropine (COGENTIN) 1 MG tablet Take 1 tablet (1 mg total) by mouth daily.  Marland Kitchen DEPAKOTE ER 250 MG 24 hr tablet Take 1 tab in am and 2 at bed time or as directed.  . desonide (DESOWEN) 0.05 % cream Apply 1 application topically daily as needed (dry skin).  Marland Kitchen ELIQUIS 5 MG TABS tablet TAKE 1 TABLET BY MOUTH TWICE A DAY  . ketoconazole (NIZORAL) 2 % shampoo Apply 1 application topically 2 (two) times a week.  Marland Kitchen  lamoTRIgine (LAMICTAL) 25 MG tablet Take 1 tab daily for 2 weeks and than 2 tab daily  . levothyroxine (SYNTHROID, LEVOTHROID) 100 MCG tablet Take 1 tablet (100 mcg total) by mouth daily.  . metFORMIN (GLUCOPHAGE-XR) 500 MG 24 hr tablet Take 2 tablets (1,000 mg total) by mouth daily with breakfast. (Patient not taking: Reported on 05/12/2016)  . Omega-3 Fatty Acids (FISH OIL) 1200 MG CPDR Take by mouth.  . perphenazine (TRILAFON) 8 MG tablet Take 3 tablets (24 mg total) by mouth at bedtime.  . phenelzine (NARDIL) 15 MG tablet Take 3 tablets (45  mg total) by mouth 2 (two) times daily.  . polyethylene glycol powder (GLYCOLAX/MIRALAX) powder Take 17 g by mouth 2 (two) times daily. Until daily soft stools  OTC (Patient not taking: Reported on 05/12/2016)  . silver sulfADIAZINE (SILVADENE) 1 % cream Apply 1 application topically daily as needed (for rash).   No facility-administered encounter medications on file as of 05/14/2016.      Substance Abuse History: Denies any use of alcohol peers there is no history suggestive of substance abuse or alcoholism.  Family History; Family History  Problem Relation Age of Onset  . Diabetes Father   . Heart disease Father   . Diabetes Brother   . OCD Mother    Labs:  No results found for this or any previous visit (from the past 2160 hour(s)).     Musculoskeletal: Strength & Muscle Tone: within normal limits Gait & Station: normal Patient leans: N/A  Mental Status Examination;   Psychiatric Specialty Exam: Physical Exam  Constitutional: He appears well-developed.  HENT:  Head: Normocephalic and atraumatic.  Skin: He is not diaphoretic.    Review of Systems  Constitutional: Negative.   HENT: Negative.   Eyes: Negative.   Respiratory: Negative.   Cardiovascular: Negative.  Negative for chest pain and palpitations.  Gastrointestinal: Negative.  Negative for heartburn.  Genitourinary: Negative.   Musculoskeletal: Negative.   Skin: Negative.  Negative for itching and rash.  Neurological: Negative.  Negative for dizziness, tingling, tremors and headaches.  Endo/Heme/Allergies: Negative.   Psychiatric/Behavioral: Positive for depression. Negative for substance abuse. The patient is not nervous/anxious.     There were no vitals taken for this visit.There is no height or weight on file to calculate BMI.  General Appearance: Casual, obese  Eye Contact::  Fair  Speech:  Slow  Volume:  Decreased  Mood:  Depressed, Euthymic and Tired  Affect:  Constricted and Depressed   Thought Process:  Coherent and Linear  Orientation:  Full (Time, Place, and Person)  Thought Content:  WDL  Suicidal Thoughts:  No  Homicidal Thoughts:  No  Memory:  Immediate;   Fair Recent;   Fair  Judgement:  Fair  Insight:  Fair  Psychomotor Activity:  Normal  Concentration:  Fair  Recall:  Fair  Akathisia:  Negative  Handed:  Right  AIMS (if indicated):     Assets:  Desire for Improvement Financial Resources/Insurance Vocational/Educational  Sleep:        Assessment: Axis I: Major depressive disorder, recurrent moderate. R/o Schizoaffective disorder (as per history -suggestive of paranoia and obsessive toughts of injuring people in remote past), OCD per history   Axis II: deffered  Axis III:  Past Medical History:  Diagnosis Date  . A-fib ([place])   . Arrhythmia   . Depression   . Depression   . Hypertension   . Low back pain   . Morbid obesity, BMI unknown ([place])   .  Osteoarthritis    oa in bilateral knees  . Seizures ([place])   . Thyroid disease   . Varicose veins     Treatment Plan and Summary: Patient continued to have episodes of depression.  I recommended to add low-dose Lamictal to help his mood swings.  He does not want to change or discontinue his Nardil, Trilafon and Depakote.  He is disappointed because he did not get [place].  I do believe he need counseling and I recommended to see Valora Piccolo .  Discuss his psychosocial stressors and triggers factors that causing depression.  Encouraged to keep himself busy by doing volunteer work.  I will continue Depakote 2:15 the morning and 500 mg at bedtime, Nardil 45 mg twice a day and Trilafon 24 mg at bedtime.  I reminded if he develop any rash with the Lamictal that he needed to stop the medication immediately.  Discuss safety plan that anytime having active suicidal thoughts or homicidal thought.  He need to call 911 or go to local emergency room.  Follow-up in 4 weeks.   Albie Bazin T., MD 05/14/2016 Patient ID:  Clydene Fake, male   DOB: [birth year], 55 y.o.   MRN: [id number]

## 2016-06-03 ENCOUNTER — Encounter: Payer: Self-pay | Admitting: Vascular Surgery

## 2016-06-04 ENCOUNTER — Other Ambulatory Visit: Payer: Self-pay | Admitting: Family Medicine

## 2016-06-09 ENCOUNTER — Ambulatory Visit (INDEPENDENT_AMBULATORY_CARE_PROVIDER_SITE_OTHER): Payer: Medicare Other | Admitting: Vascular Surgery

## 2016-06-09 ENCOUNTER — Telehealth: Payer: Self-pay | Admitting: *Deleted

## 2016-06-09 ENCOUNTER — Encounter: Payer: Self-pay | Admitting: Vascular Surgery

## 2016-06-09 VITALS — BP 121/81 | HR 89 | Temp 98.5°F | Resp 18 | Ht 70.5 in | Wt 270.0 lb

## 2016-06-09 DIAGNOSIS — I83891 Varicose veins of right lower extremities with other complications: Secondary | ICD-10-CM

## 2016-06-09 NOTE — Telephone Encounter (Signed)
Returning [name] (mother of [name]) telephone message inquiring about when [name] should restart Eliquis.  [name] is s/p today (06-09-2016) endovenous laser ablation right greater saphenous vein by Victorino Dike MD.  Reviewed Dr. Evelena Leyden 05-12-2016 office note and spoke with Thea Silversmith RN (Dr. Evelena Leyden nurse) who both advised to restart Eliquis on Thursday (06-11-2016) 48 hours post procedure.  Spoke with [name] and made her aware [name] can restart Eliquis on 06-11-2016.  [name] verbalized understanding of instructions.

## 2016-06-09 NOTE — Progress Notes (Signed)
Laser Ablation Procedure    Date: 06/09/2016   PROMETHEUS CRONEY DOB:20-May-1961  Consent signed: Yes    Surgeon:  Dr. Nelda Severe. Kellie Simmering  Procedure: Laser Ablation: right Greater Saphenous Vein  BP 121/81   Pulse 89   Temp 98.5 F (36.9 C)   Resp 18   Ht 5' 10.5" (1.791 m)   Wt 270 lb (122.5 kg)   SpO2 92%   BMI 38.19 kg/m   Tumescent Anesthesia: 405 cc 0.9% NaCl with 50 cc Lidocaine HCL with 1% Epi and 15 cc 8.4% NaHCO3  Local Anesthesia: 4 cc Lidocaine HCL and NaHCO3 (ratio 2:1)  Pulsed Mode: 15 watts, 525ms delay, 1.0 duration  Total Energy: 2049             Total Pulses:137                Total Time: 2:16    Patient tolerated procedure well  Notes:   Description of Procedure:  After marking the course of the secondary varicosities, the patient was placed on the operating table in the supine position, and the right leg was prepped and draped in sterile fashion.   Local anesthetic was administered and under ultrasound guidance the saphenous vein was accessed with a micro needle and guide wire; then the mirco puncture sheath was placed.  A guide wire was inserted saphenofemoral junction , followed by a 5 french sheath.  The position of the sheath and then the laser fiber below the junction was confirmed using the ultrasound.  Tumescent anesthesia was administered along the course of the saphenous vein using ultrasound guidance. The patient was placed in Trendelenburg position and protective laser glasses were placed on patient and staff, and the laser was fired at 15 watts continuous mode advancing 1-57mm/second for a total of 2049 joules.     Steri strips were applied to the stab wounds and ABD pads and thigh high compression stockings were applied.  Ace wrap bandages were applied over the phlebectomy sites and at the top of the saphenofemoral junction. Blood loss was less than 15 cc.  The patient ambulated out of the operating room having tolerated the procedure well.

## 2016-06-09 NOTE — Progress Notes (Signed)
Subjective:     Patient ID: [name], male   DOB: August 27, 1960, 55 y.o.   MRN: [id number]  HPI This 55 year old male had laser ablation of the right great saphenous vein from the distal thigh to near the saphenofemoral junction performed under local tumescent anesthesia. A total of 2049 J of energy was utilized. He tolerated the procedure well.  Review of Systems     Objective:   Physical Exam BP 121/81   Pulse 89   Temp 98.5 F (36.9 C)   Resp 18   Ht 5' 10.5" (1.791 m)   Wt 270 lb (122.5 kg)   SpO2 92%   BMI 38.19 kg/m        Assessment:     Well-tolerated laser ablation right great saphenous vein from distal thigh to near the saphenofemoral junction performed under local tumescent anesthesia    Plan:     Return in 1 week for venous duplex exam to confirm closure right great saphenous vein

## 2016-06-10 ENCOUNTER — Encounter: Payer: Self-pay | Admitting: Vascular Surgery

## 2016-06-10 ENCOUNTER — Other Ambulatory Visit (HOSPITAL_COMMUNITY): Payer: Self-pay | Admitting: Psychiatry

## 2016-06-10 DIAGNOSIS — F330 Major depressive disorder, recurrent, mild: Secondary | ICD-10-CM

## 2016-06-11 ENCOUNTER — Other Ambulatory Visit (HOSPITAL_COMMUNITY): Payer: Self-pay | Admitting: Psychiatry

## 2016-06-12 ENCOUNTER — Encounter (HOSPITAL_COMMUNITY): Payer: Self-pay | Admitting: Psychiatry

## 2016-06-12 ENCOUNTER — Ambulatory Visit (INDEPENDENT_AMBULATORY_CARE_PROVIDER_SITE_OTHER): Payer: Medicare Other | Admitting: Psychiatry

## 2016-06-12 DIAGNOSIS — Z8249 Family history of ischemic heart disease and other diseases of the circulatory system: Secondary | ICD-10-CM | POA: Diagnosis not present

## 2016-06-12 DIAGNOSIS — Z9889 Other specified postprocedural states: Secondary | ICD-10-CM | POA: Diagnosis not present

## 2016-06-12 DIAGNOSIS — F330 Major depressive disorder, recurrent, mild: Secondary | ICD-10-CM

## 2016-06-12 DIAGNOSIS — Z833 Family history of diabetes mellitus: Secondary | ICD-10-CM

## 2016-06-12 DIAGNOSIS — Z888 Allergy status to other drugs, medicaments and biological substances status: Secondary | ICD-10-CM

## 2016-06-12 DIAGNOSIS — Z79899 Other long term (current) drug therapy: Secondary | ICD-10-CM

## 2016-06-12 MED ORDER — BENZTROPINE MESYLATE 1 MG PO TABS: 1.0000 mg | ORAL_TABLET | Freq: Every day | ORAL | 2 refills | Status: DC

## 2016-06-12 MED ORDER — DEPAKOTE ER 250 MG PO TB24: ORAL_TABLET | ORAL | 2 refills | Status: DC

## 2016-06-12 MED ORDER — PERPHENAZINE 8 MG PO TABS: 24.0000 mg | ORAL_TABLET | Freq: Every day | ORAL | 2 refills | Status: DC

## 2016-06-12 MED ORDER — PHENELZINE SULFATE 15 MG PO TABS: 45.0000 mg | ORAL_TABLET | Freq: Two times a day (BID) | ORAL | 2 refills | Status: DC

## 2016-06-12 NOTE — Progress Notes (Signed)
[place] MD/PA/NP OP Progress Note  06/12/2016 10:49 AM [name]  MRN:  [id number]  Chief Complaint:  Chief Complaint    Follow-up     Subjective:  I tried Lamictal for 3 days but I stop after I develop rash.  The rash is gone.  I saw once therapist but not impressed but I give more chances to her.  HPI: [name] came for his follow-up appointment.  On his last visit we recommended to try Lamictal as patient continued to feel depressed but after taking for 3 days he stopped due to rash.  He is relieved that rash is gone.  However he still feel chronic depression and disappointment because he is not getting better.  He admitted most of the time he feels bored because he has no companion and friendship.  He had a quiet Thanksgiving at his mother's house.  And he is planning to have a Christmas the same.  Patient saw once Valora Piccolo but he feels not very impressive but promised that he will continue to see her a few more times.  Patient did not like Frankie in this office.  He did not like other providers in the past.  He is taking Nardil and he does not want to change his antidepressant.  He is compliant with Depakote, Trilafon and Cogentin.  He has no tremors or shakes.  He denies any suicidal thoughts but he has chronic emptiness and boredom or depression.  His appetite is okay.  He denies any hallucination, paranoia or any aggressive behavior.  His energy level is okay.  His vital signs are stable.  He lives by himself.  Visit Diagnosis:    ICD-9-CM ICD-10-CM   1. Major depressive disorder, recurrent episode, mild (HCC) 296.31 F33.0 phenelzine (NARDIL) 15 MG tablet     perphenazine (TRILAFON) 8 MG tablet     DEPAKOTE ER 250 MG 24 hr tablet     benztropine (COGENTIN) 1 MG tablet    Past Psychiatric History:  Patient has multiple hospitalizations from 1979-1985 .  He remember mostly he was admitted due to severe depression and having suicidal thoughts with negative thinking but denies any  suicidal attempt.  He was diagnosed with major depressive disorder, schizoaffective disorder and OCD.  He admitted history of somewhat paranoia and intrusive thoughts in the past.  Since he is taking his current medication more than 35 years ago he is been pretty stable and is happy about it.  He had tried numerous psychotherapy medication mostly TCAs.  He followed outpatient services with a psychiatrist in [place] Dr. Danny Lawless, who retired couple of years ago so he started with [facility] outpatient clinic but is not satisfied and wants to change this provider.  His last psychiatrist was Dr. De Nurse in [place].  Recently we tried Lamictal but he developed a rash.  Past Medical History:  Past Medical History:  Diagnosis Date  . A-fib ([place])   . Arrhythmia   . Depression   . Depression   . Hypertension   . Low back pain   . Morbid obesity, BMI unknown ([place])   . Osteoarthritis    oa in bilateral knees  . Seizures ([place])   . Thyroid disease   . Varicose veins     Past Surgical History:  Procedure Laterality Date  . ACHILLES TENDON REPAIR  approx. 2004   right foot  . CATARACT EXTRACTION     bilateral  . TOOTH EXTRACTION      Family Psychiatric History:  See below.  Family History:  Family History  Problem Relation Age of Onset  . Diabetes Father   . Heart disease Father   . Diabetes Brother   . OCD Mother     Social History:  Social History   Social History  . Marital status: Single    Spouse name: N/A  . Number of children: N/A  . Years of education: N/A   Occupational History  . disabled    Social History Main Topics  . Smoking status: Never Smoker  . Smokeless tobacco: Never Used  . Alcohol use No  . Drug use: No  . Sexual activity: No   Other Topics Concern  . None   Social History Narrative  . None    Allergies:  Allergies  Allergen Reactions  . Lamictal [Lamotrigine] Rash  . Metformin And Related Rash    Metabolic Disorder Labs: Lab  Results  Component Value Date   HGBA1C 6.0 12/23/2015   MPG 146 (H) 04/24/2015   MPG 140 02/05/2007   No results found for: PROLACTIN Lab Results  Component Value Date   CHOL 172 04/30/2015   TRIG 170 (H) 04/30/2015   HDL 26 (L) 04/30/2015   CHOLHDL 6.6 (H) 04/30/2015   VLDL 34 (H) 04/30/2015   LDLCALC 112 04/30/2015   LDLCALC 118 (H) 05/28/2011     Current Medications: Current Outpatient Prescriptions  Medication Sig Dispense Refill  . acetaminophen (TYLENOL) 500 MG tablet Take 1,000 mg by mouth every 8 (eight) hours as needed for mild pain or moderate pain.    Marland Kitchen allopurinol (ZYLOPRIM) 300 MG tablet TAKE 1 TABLET BY MOUTH EVERY DAY 90 tablet 3  . benztropine (COGENTIN) 1 MG tablet Take 1 tablet (1 mg total) by mouth daily. 30 tablet 2  . DEPAKOTE ER 250 MG 24 hr tablet Take 1 tab in am and 2 at bed time or as directed. 90 tablet 2  . desonide (DESOWEN) 0.05 % cream Apply 1 application topically daily as needed (dry skin).    Marland Kitchen ELIQUIS 5 MG TABS tablet TAKE 1 TABLET BY MOUTH TWICE A DAY 60 tablet 11  . ketoconazole (NIZORAL) 2 % shampoo Apply 1 application topically 2 (two) times a week. 120 mL 0  . levothyroxine (SYNTHROID, LEVOTHROID) 100 MCG tablet Take 1 tablet (100 mcg total) by mouth daily. 90 tablet 3  . metFORMIN (GLUCOPHAGE-XR) 500 MG 24 hr tablet TAKE 2 TABLETS BY MOUTH EVERY DAY WITH BREAKFAST 180 tablet 3  . Omega-3 Fatty Acids (FISH OIL) 1200 MG CPDR Take by mouth.    . perphenazine (TRILAFON) 8 MG tablet Take 3 tablets (24 mg total) by mouth at bedtime. 90 tablet 2  . phenelzine (NARDIL) 15 MG tablet Take 3 tablets (45 mg total) by mouth 2 (two) times daily. 180 tablet 2  . polyethylene glycol powder (GLYCOLAX/MIRALAX) powder Take 17 g by mouth 2 (two) times daily. Until daily soft stools  OTC (Patient not taking: Reported on 05/12/2016) 225 g 0  . silver sulfADIAZINE (SILVADENE) 1 % cream Apply 1 application topically daily as needed (for rash).     No current  facility-administered medications for this visit.     Neurologic: Headache: No Seizure: No Paresthesias: No  Musculoskeletal: Strength & Muscle Tone: within normal limits Gait & Station: normal Patient leans: N/A  Psychiatric Specialty Exam: Review of Systems  Constitutional: Negative.   HENT: Negative.   Skin: Negative.  Negative for itching and rash.  Neurological: Negative.  Blood pressure 132/78, pulse (!) 59, height 5' 10.5" (1.791 m), weight 273 lb 6.4 oz (124 kg).Body mass index is 38.67 kg/m.  General Appearance: Casual  Eye Contact:  Good  Speech:  Slow  Volume:  Normal  Mood:  Dysphoric  Affect:  Constricted  Thought Process:  Goal Directed  Orientation:  Full (Time, Place, and Person)  Thought Content: Rumination   Suicidal Thoughts:  No  Homicidal Thoughts:  No  Memory:  Immediate;   Good Recent;   Good Remote;   Good  Judgement:  Good  Insight:  Fair  Psychomotor Activity:  Normal  Concentration:  Concentration: Good and Attention Span: Good  Recall:  [place] of Knowledge: Good  Language: Good  Akathisia:  No  Handed:  Right  AIMS (if indicated):  None reported   Assets:  Communication Skills Desire for Improvement Housing Physical Health  ADL's:  Intact  Cognition: WNL  Sleep:  Good      Assessment ; [name] is a 55 year old Caucasian man who has diabetes of major depressive disorder , schizoaffective disorder came for his follow-up appointment.  He claims to have residual depression, emptiness and boredom .  Plan I review his symptoms, current medication and his psychosocial stressors.  I encouraged to give more time to [facility] for counseling.  We discuss that he need may be a volunteer work to keep himself busy.  He is disappointed because he was not able to get [place].  I also offer ECT treatment but patient declined.  I will discontinue Lamictal due to side effects.  Patient is not interested to stop or change his antidepressant at  this time.  I will defer to add any other antidepressant because patient is taking Nardil.  Patient agreed with the plan.  He will give more time to therapy.  I will continue Nardil 45 mg twice a day, Trilafon 24 mg bedtime and Depakote 500 mg at bedtime.  Discussed medication side effects and benefits.  He has no tremors shakes or any EPS.  Discuss safety plan that anytime having active suicidal thoughts or homicidal thought.  He need to call 911 or go to the local emergency room.  Follow-up in 3 months.   Shamica Moree T., MD 06/12/2016, 10:49 AM

## 2016-06-16 ENCOUNTER — Encounter: Payer: Self-pay | Admitting: Vascular Surgery

## 2016-06-16 ENCOUNTER — Ambulatory Visit (HOSPITAL_COMMUNITY)
Admission: RE | Admit: 2016-06-16 | Discharge: 2016-06-16 | Disposition: A | Discharge status: Home / Self Care | Payer: Medicare Other | Source: Ambulatory Visit | Attending: Vascular Surgery | Admitting: Vascular Surgery

## 2016-06-16 ENCOUNTER — Ambulatory Visit (INDEPENDENT_AMBULATORY_CARE_PROVIDER_SITE_OTHER): Payer: Medicare Other | Admitting: Vascular Surgery

## 2016-06-16 VITALS — BP 140/84 | HR 87 | Temp 98.6°F | Resp 18 | Ht 70.5 in | Wt 269.5 lb

## 2016-06-16 DIAGNOSIS — Z9889 Other specified postprocedural states: Secondary | ICD-10-CM | POA: Diagnosis not present

## 2016-06-16 DIAGNOSIS — I83893 Varicose veins of bilateral lower extremities with other complications: Secondary | ICD-10-CM | POA: Diagnosis not present

## 2016-06-16 DIAGNOSIS — I83891 Varicose veins of right lower extremities with other complications: Secondary | ICD-10-CM | POA: Diagnosis not present

## 2016-06-16 NOTE — Progress Notes (Signed)
Subjective:     Patient ID: [name], male   DOB: 1961/02/19, 55 y.o.   MRN: [id number]  HPI This 55 year old male returns 1 week post-laser ablation right great saphenous vein from distal thigh to near the saphenofemoral junction performed for painful varicosities with swelling. He has had mild to moderate discomfort along the course of great saphenous vein in the thigh. He did take his ibuprofen as instructed. He discontinued his Eliquis for the procedure. He did wear his elastic compression stocking as instructed. He has no specific complaints. He noticed no distal edema.  Past Medical History:  Diagnosis Date  . A-fib ([place])   . Arrhythmia   . Depression   . Depression   . Hypertension   . Low back pain   . Morbid obesity, BMI unknown ([place])   . Osteoarthritis    oa in bilateral knees  . Seizures ([place])   . Thyroid disease   . Varicose veins     Social History  Substance Use Topics  . Smoking status: Never Smoker  . Smokeless tobacco: Never Used  . Alcohol use No    Family History  Problem Relation Age of Onset  . Diabetes Father   . Heart disease Father   . Diabetes Brother   . OCD Mother     Allergies  Allergen Reactions  . Lamictal [Lamotrigine] Rash  . Metformin And Related Rash     Current Outpatient Prescriptions:  .  acetaminophen (TYLENOL) 500 MG tablet, Take 1,000 mg by mouth every 8 (eight) hours as needed for mild pain or moderate pain., Disp: , Rfl:  .  allopurinol (ZYLOPRIM) 300 MG tablet, TAKE 1 TABLET BY MOUTH EVERY DAY, Disp: 90 tablet, Rfl: 3 .  benztropine (COGENTIN) 1 MG tablet, Take 1 tablet (1 mg total) by mouth daily., Disp: 30 tablet, Rfl: 2 .  DEPAKOTE ER 250 MG 24 hr tablet, Take 2 tab in am and 2 at bed time, Disp: 120 tablet, Rfl: 2 .  desonide (DESOWEN) 0.05 % cream, Apply 1 application topically daily as needed (dry skin)., Disp: , Rfl:  .  ELIQUIS 5 MG TABS tablet, TAKE 1 TABLET BY MOUTH TWICE A DAY, Disp: 60 tablet, Rfl: 11 .   ketoconazole (NIZORAL) 2 % shampoo, Apply 1 application topically 2 (two) times a week., Disp: 120 mL, Rfl: 0 .  levothyroxine (SYNTHROID, LEVOTHROID) 100 MCG tablet, Take 1 tablet (100 mcg total) by mouth daily., Disp: 90 tablet, Rfl: 3 .  Omega-3 Fatty Acids (FISH OIL) 1200 MG CPDR, Take by mouth., Disp: , Rfl:  .  perphenazine (TRILAFON) 8 MG tablet, Take 3 tablets (24 mg total) by mouth at bedtime., Disp: 90 tablet, Rfl: 2 .  phenelzine (NARDIL) 15 MG tablet, Take 3 tablets (45 mg total) by mouth 2 (two) times daily., Disp: 180 tablet, Rfl: 2 .  silver sulfADIAZINE (SILVADENE) 1 % cream, Apply 1 application topically daily as needed (for rash)., Disp: , Rfl:  .  metFORMIN (GLUCOPHAGE-XR) 500 MG 24 hr tablet, TAKE 2 TABLETS BY MOUTH EVERY DAY WITH BREAKFAST (Patient not taking: Reported on 06/16/2016), Disp: 180 tablet, Rfl: 3 .  polyethylene glycol powder (GLYCOLAX/MIRALAX) powder, Take 17 g by mouth 2 (two) times daily. Until daily soft stools  OTC (Patient not taking: Reported on 06/16/2016), Disp: 225 g, Rfl: 0  Vitals:   06/16/16 0841  BP: 140/84  Pulse: 87  Resp: 18  Temp: 98.6 F (37 C)  TempSrc: Oral  SpO2: 93%  Weight: 269 lb 8 oz (122.2 kg)  Height: 5' 10.5" (1.791 m)    Body mass index is 38.12 kg/m.         Review of Systems Last chest pain, dyspnea on exertion, PND, orthopnea, hemoptysis    Objective:   Physical Exam BP 140/84 (BP Location: Right Arm, Patient Position: Sitting, Cuff Size: Large)   Pulse 87   Temp 98.6 F (37 C) (Oral)   Resp 18   Ht 5' 10.5" (1.791 m)   Wt 269 lb 8 oz (122.2 kg)   SpO2 93%   BMI 38.12 kg/m   Gen. well-developed well-nourished male no apparent distress alert and oriented 3 Lungs no rhonchi or wheezing Right leg with mild ecchymosis in mid to proximal thigh medially. Mild tenderness to deep palpation over great saphenous vein. A few thrombosed varicosities palpable in the distal thigh. No distal edema noted. Bulging  varicosities from the knee to the medial malleolus are less prominent than previously but still noticeable. 3+ dorsalis pedis pulse palpable.  Today I ordered a venous duplex exam the right leg which I reviewed and interpreted. There is no DVT. There is total closure of the great saphenous vein up to near the saphenofemoral junction.     Assessment:     Successful laser ablation right great saphenous vein for gross reflux with painful varicosities and swelling    Plan:     Patient will return next week for similar procedure and left leg He will then return in 3 months to see if stab phlebectomy will be indicated

## 2016-06-17 ENCOUNTER — Ambulatory Visit (HOSPITAL_COMMUNITY): Payer: Self-pay | Admitting: Psychiatry

## 2016-06-17 ENCOUNTER — Encounter: Payer: Self-pay | Admitting: Vascular Surgery

## 2016-06-19 ENCOUNTER — Telehealth: Payer: Self-pay | Admitting: *Deleted

## 2016-06-19 NOTE — Telephone Encounter (Addendum)
Returning [name]' s (mother of [name])  telephone voice message. She has questions about her son's upcoming office surgery and follow up appointments. Explained in depth that 06-22-2016 endovenous laser ablation by Dr. Kellie Simmering will be done on [place]'s left leg and explained that the follow ups on 06-30-2016 included an ultrasound to make sure left greater saphenous vein is closed and no problems/complications and a follow up appointment with Dr. Kellie Simmering.  Explained that Medicare/Medicaid requires that he would have to come back in three months to assess need for stab phlebectomy as laser ablation and stabs could not be done on same visit (have to be staged) with Medicare. Discussed that Eliquis had to be held (stopped) for three days prior to office surgery on 06-22-2016. Hinton Dyer Dolezal verbalized understanding of all issues discussed.

## 2016-06-21 ENCOUNTER — Encounter (HOSPITAL_BASED_OUTPATIENT_CLINIC_OR_DEPARTMENT_OTHER): Payer: Self-pay | Admitting: Emergency Medicine

## 2016-06-21 ENCOUNTER — Emergency Department (HOSPITAL_BASED_OUTPATIENT_CLINIC_OR_DEPARTMENT_OTHER)
Admission: EM | Admit: 2016-06-21 | Discharge: 2016-06-21 | Disposition: A | Discharge status: Home / Self Care | Payer: Medicare Other | Attending: Emergency Medicine | Admitting: Emergency Medicine

## 2016-06-21 DIAGNOSIS — M545 Low back pain, unspecified: Secondary | ICD-10-CM

## 2016-06-21 DIAGNOSIS — Z79899 Other long term (current) drug therapy: Secondary | ICD-10-CM | POA: Diagnosis not present

## 2016-06-21 DIAGNOSIS — I10 Essential (primary) hypertension: Secondary | ICD-10-CM | POA: Insufficient documentation

## 2016-06-21 DIAGNOSIS — E119 Type 2 diabetes mellitus without complications: Secondary | ICD-10-CM | POA: Diagnosis not present

## 2016-06-21 DIAGNOSIS — Z7984 Long term (current) use of oral hypoglycemic drugs: Secondary | ICD-10-CM | POA: Diagnosis not present

## 2016-06-21 MED ORDER — DEXAMETHASONE SODIUM PHOSPHATE 10 MG/ML IJ SOLN
10.0000 mg | Freq: Once | INTRAMUSCULAR | Status: AC
  Administered 2016-06-21: 10 mg via INTRAMUSCULAR
  Filled 2016-06-21: qty 1

## 2016-06-21 MED ORDER — METHOCARBAMOL 500 MG PO TABS
500.0000 mg | ORAL_TABLET | Freq: Once | ORAL | Status: AC
  Administered 2016-06-21: 500 mg via ORAL
  Filled 2016-06-21: qty 1

## 2016-06-21 MED ORDER — METHOCARBAMOL 500 MG PO TABS: 500.0000 mg | ORAL_TABLET | Freq: Two times a day (BID) | ORAL | 0 refills | Status: DC

## 2016-06-21 NOTE — ED Notes (Signed)
C/o mid low back pain, L=R, (denies: radiation, weakness, fall, injury, urinary sx, fever, nvd, dizziness, numbness or tingling), mentions recent varicose vein surgery, alert, NAD, calm, interactive, resps e/u, speaking in clear complete sentences, no dyspnea noted, skin W&D.

## 2016-06-21 NOTE — ED Notes (Signed)
Pt given d/c instructions as per chart. Verbalizes understanding. No questions. Rx x 1 with precautions 

## 2016-06-21 NOTE — ED Notes (Addendum)
EDPA into room. Pt seen by PA prior to RN assessment, see PA notes, pending orders.

## 2016-06-21 NOTE — Discharge Instructions (Signed)
Take the prescribed medication as directed.  You were given a steroid injection which will continue to work for the next 2-3 days as well. Follow-up with your primary care doctor. Return to the ED for new or worsening symptoms.

## 2016-06-21 NOTE — ED Triage Notes (Signed)
Patient with low back pain, onset Friday after bending over. Patient has tried heat and ibuprofen without relief.

## 2016-06-21 NOTE — ED Provider Notes (Signed)
[facility] DEPT [facility] Provider Note   CSN: [id number] Arrival date & time: 06/21/16  1859  By signing my name below, I, Soijett Blue, attest that this documentation has been prepared under the direction and in the presence of Quincy Carnes, PA-C Electronically Signed: Soijett Blue, ED Scribe. 06/21/16. 7:39 PM.  History   Chief Complaint Chief Complaint  Patient presents with  . Back Pain    HPI [name] is a 55 y.o. male with a PMHx of HTN, who presents to the Emergency Department complaining of non-radiating lower back pain onset 2 days ago. Pt reports that his lower back pain occurred following bending over in the shower to pick up soap. Pt describes his lower back pain as a shooting sensation. Pt denies recent fall or trauma. Pt lower back pain is worsened with bending and movement. He has tried heat and ibuprofen with no relief for his symptoms. Pt denies fever, chills, numbness, tingling, and any other symptoms. No bowel or bladder incontinence.  Pt states that he is allergic to Lamictal and Metformin.   The history is provided by the patient. No language interpreter was used.    Past Medical History:  Diagnosis Date  . A-fib ([place])   . Arrhythmia   . Depression   . Depression   . Hypertension   . Low back pain   . Morbid obesity, BMI unknown ([place])   . Osteoarthritis    oa in bilateral knees  . Seizures ([place])   . Thyroid disease   . Varicose veins     Patient Active Problem List   Diagnosis Date Noted  . Major depressive disorder, recurrent episode, mild ([place]) 03/30/2016  . Varicose veins of right lower extremity with complications 0000000  . Seborrheic keratosis 12/23/2015  . Varicose veins 12/23/2015  . Erectile dysfunction 04/26/2015  . Thyroid disease   . Seizures ([place])   . Depression   . Arrhythmia   . Obesity, Class III, BMI 40-49.9 (morbid obesity) ([place])   . Low testosterone 02/10/2007  . DERMATITIS, SEBORRHEIC 02/10/2007  . T2DM (type 2  diabetes mellitus) ([place]) 02/10/2007    Past Surgical History:  Procedure Laterality Date  . ACHILLES TENDON REPAIR  approx. 2004   right foot  . CATARACT EXTRACTION     bilateral  . TOOTH EXTRACTION         Home Medications    Prior to Admission medications   Medication Sig Start Date End Date Taking? Authorizing Provider  allopurinol (ZYLOPRIM) 300 MG tablet TAKE 1 TABLET BY MOUTH EVERY DAY 12/04/15  Yes Vivi Barrack, MD  benztropine (COGENTIN) 1 MG tablet Take 1 tablet (1 mg total) by mouth daily. 06/12/16  Yes Kathlee Nations, MD  DEPAKOTE ER 250 MG 24 hr tablet Take 2 tab in am and 2 at bed time 06/12/16  Yes Kathlee Nations, MD  desonide (DESOWEN) 0.05 % cream Apply 1 application topically daily as needed (dry skin).   Yes Historical Provider, MD  ketoconazole (NIZORAL) 2 % shampoo Apply 1 application topically 2 (two) times a week. 12/23/15  Yes Vivi Barrack, MD  levothyroxine (SYNTHROID, LEVOTHROID) 100 MCG tablet Take 1 tablet (100 mcg total) by mouth daily. 12/31/15  Yes Vivi Barrack, MD  perphenazine (TRILAFON) 8 MG tablet Take 3 tablets (24 mg total) by mouth at bedtime. 06/12/16  Yes Kathlee Nations, MD  phenelzine (NARDIL) 15 MG tablet Take 3 tablets (45 mg total) by mouth 2 (two) times daily.  06/12/16  Yes Kathlee Nations, MD  silver sulfADIAZINE (SILVADENE) 1 % cream Apply 1 application topically daily as needed (for rash).   Yes Historical Provider, MD  acetaminophen (TYLENOL) 500 MG tablet Take 1,000 mg by mouth every 8 (eight) hours as needed for mild pain or moderate pain.    Historical Provider, MD  ELIQUIS 5 MG TABS tablet TAKE 1 TABLET BY MOUTH TWICE A DAY 04/06/16   Vivi Barrack, MD  metFORMIN (GLUCOPHAGE-XR) 500 MG 24 hr tablet TAKE 2 TABLETS BY MOUTH EVERY DAY WITH BREAKFAST Patient not taking: Reported on 06/16/2016 06/05/16   Vivi Barrack, MD  Omega-3 Fatty Acids (FISH OIL) 1200 MG CPDR Take by mouth.    Historical Provider, MD  polyethylene glycol powder  (GLYCOLAX/MIRALAX) powder Take 17 g by mouth 2 (two) times daily. Until daily soft stools  OTC Patient not taking: Reported on 06/16/2016 03/24/15   Nona Dell, PA-C    Family History Family History  Problem Relation Age of Onset  . Diabetes Father   . Heart disease Father   . Diabetes Brother   . OCD Mother     Social History Social History  Substance Use Topics  . Smoking status: Never Smoker  . Smokeless tobacco: Never Used  . Alcohol use No     Allergies   Lamictal [lamotrigine] and Metformin and related   Review of Systems Review of Systems  Constitutional: Negative for chills and fever.  Musculoskeletal: Positive for back pain (lower).  Neurological: Negative for numbness.       No tingling  All other systems reviewed and are negative.    Physical Exam Updated Vital Signs BP 126/83 (BP Location: Right Arm)   Pulse 93   Temp 97.6 F (36.4 C) (Oral)   Resp 18   Ht 5' 10.5" (1.791 m)   Wt 270 lb (122.5 kg)   SpO2 95%   BMI 38.19 kg/m   Physical Exam  Constitutional: He is oriented to person, place, and time. He appears well-developed and well-nourished.  HENT:  Head: Normocephalic and atraumatic.  Mouth/Throat: Oropharynx is clear and moist.  Eyes: Conjunctivae and EOM are normal. Pupils are equal, round, and reactive to light.  Neck: Normal range of motion.  Cardiovascular: Normal rate, regular rhythm and normal heart sounds.   Pulmonary/Chest: Effort normal and breath sounds normal.  Abdominal: Soft. Bowel sounds are normal.  Musculoskeletal: Normal range of motion.  Some mild muscular tenderness of lumbar paraspinal musculature; no midline tenderness or step-off; no gross deformities; full ROM maintained; normal strength and sensation throughout both legs; ambulatory with steady gait  Neurological: He is alert and oriented to person, place, and time.  Skin: Skin is warm and dry.  Psychiatric: He has a normal mood and affect.  Nursing  note and vitals reviewed.    ED Treatments / Results  DIAGNOSTIC STUDIES: Oxygen Saturation is 95% on RA, adequate by my interpretation.    COORDINATION OF CARE: 7:39 PM Discussed treatment plan with pt at bedside which includes decadron injection and robaxin and pt agreed to plan.  Procedures Procedures (including critical care time)  Medications Ordered in ED Medications - No data to display   Initial Impression / Assessment and Plan / ED Course  I have reviewed the triage vital signs and the nursing notes.  Clinical Course    55 year old male here with low back pain. Began 2 days ago after bending over in the shower pick up so. No falls  or trauma.  On exam he has mild tenderness of the lumbar musculature without midline step-off or deformity. No neurologic deficits to suggest cauda equina.  No red flag symptoms.  Will treat with decadron IM here, robaxin for home.  Encouraged to follow-up with PCP.  Discussed plan with patient, he acknowledged understanding and agreed with plan of care.  Return precautions given for new or worsening symptoms.  Final Clinical Impressions(s) / ED Diagnoses   Final diagnoses:  Acute bilateral low back pain without sciatica    New Prescriptions Discharge Medication List as of 06/21/2016  8:17 PM    START taking these medications   Details  methocarbamol (ROBAXIN) 500 MG tablet Take 1 tablet (500 mg total) by mouth 2 (two) times daily., Starting Sun 06/21/2016, Print       I personally performed the services described in this documentation, which was scribed in my presence. The recorded information has been reviewed and is accurate.    Larene Pickett, PA-C 06/21/16 2035    Isla Pence, MD 06/21/16 2129

## 2016-06-22 ENCOUNTER — Ambulatory Visit (INDEPENDENT_AMBULATORY_CARE_PROVIDER_SITE_OTHER): Payer: Medicare Other | Admitting: Vascular Surgery

## 2016-06-22 ENCOUNTER — Encounter: Payer: Self-pay | Admitting: Vascular Surgery

## 2016-06-22 VITALS — BP 136/93 | HR 100 | Resp 18 | Ht 70.5 in | Wt 270.0 lb

## 2016-06-22 DIAGNOSIS — I83892 Varicose veins of left lower extremities with other complications: Secondary | ICD-10-CM

## 2016-06-22 NOTE — Progress Notes (Signed)
Subjective:     Patient ID: [name], male   DOB: [birth year], 55 y.o.   MRN: [id number]  HPI This 55 year old male had laser ablation left great saphenous vein from the distal thigh to near the saphenofemoral junction performed under local tumescent anesthesia. A total of 1870 J of energy was utilized. He tolerated the procedure well.  Review of Systems     Objective:   Physical Exam BP (!) 136/93   Pulse 100   Resp 18   Ht 5' 10.5" (1.791 m)   Wt 270 lb (122.5 kg)   SpO2 98%   BMI 38.19 kg/m        Assessment:     Well-tolerated laser ablation left great saphenous vein from distal thigh to near the saphenofemoral junction performed under local tumescent anesthesia    Plan:     Return in 1 week for venous duplex exam to confirm closure left great saphenous vein

## 2016-06-22 NOTE — Progress Notes (Signed)
Laser Ablation Procedure    Date: 06/22/2016   [name] DOB:07-Apr-1961  Consent signed: Yes    Surgeon:  Dr. Nelda Severe. Kellie Simmering  Procedure: Laser Ablation: left Greater Saphenous Vein  BP (!) 136/93   Pulse 100   Resp 18   Ht 5' 10.5" (1.791 m)   Wt 270 lb (122.5 kg)   SpO2 98%   BMI 38.19 kg/m   Tumescent Anesthesia: 330 cc 0.9% NaCl with 50 cc Lidocaine HCL with 1% Epi and 15 cc 8.4% NaHCO3  Local Anesthesia: 5 cc Lidocaine HCL and NaHCO3 (ratio 2:1)  Pulsed Mode: 15 watts, 528ms delay, 1.0 duration  Total Energy: 1870             Total Pulses: 125               Total Time: 2:04    Patient tolerated procedure well  Notes:   Description of Procedure:  After marking the course of the secondary varicosities, the patient was placed on the operating table in the supine position, and the left leg was prepped and draped in sterile fashion.   Local anesthetic was administered and under ultrasound guidance the saphenous vein was accessed with a micro needle and guide wire; then the mirco puncture sheath was placed.  A guide wire was inserted saphenofemoral junction , followed by a 5 french sheath.  The position of the sheath and then the laser fiber below the junction was confirmed using the ultrasound.  Tumescent anesthesia was administered along the course of the saphenous vein using ultrasound guidance. The patient was placed in Trendelenburg position and protective laser glasses were placed on patient and staff, and the laser was fired at 15 watts continuous mode advancing 1-71mm/second for a total of 1870 joules.     Steri strips were applied to the stab wounds and ABD pads and thigh high compression stockings were applied.  Ace wrap bandages were applied over the phlebectomy sites and at the top of the saphenofemoral junction. Blood loss was less than 15 cc.  The patient ambulated out of the operating room having tolerated the procedure well.

## 2016-06-23 ENCOUNTER — Encounter: Payer: Self-pay | Admitting: Vascular Surgery

## 2016-06-25 ENCOUNTER — Encounter: Payer: Self-pay | Admitting: Vascular Surgery

## 2016-06-30 ENCOUNTER — Encounter: Payer: Self-pay | Admitting: Vascular Surgery

## 2016-06-30 ENCOUNTER — Ambulatory Visit (HOSPITAL_COMMUNITY)
Admission: RE | Admit: 2016-06-30 | Discharge: 2016-06-30 | Disposition: A | Discharge status: Home / Self Care | Payer: Medicare Other | Source: Ambulatory Visit | Attending: Vascular Surgery | Admitting: Vascular Surgery

## 2016-06-30 ENCOUNTER — Ambulatory Visit (INDEPENDENT_AMBULATORY_CARE_PROVIDER_SITE_OTHER): Payer: Medicare Other | Admitting: Vascular Surgery

## 2016-06-30 VITALS — BP 130/84 | HR 90 | Temp 98.0°F | Resp 18 | Ht 70.5 in | Wt 266.8 lb

## 2016-06-30 DIAGNOSIS — I83892 Varicose veins of left lower extremities with other complications: Secondary | ICD-10-CM | POA: Diagnosis not present

## 2016-06-30 DIAGNOSIS — I83893 Varicose veins of bilateral lower extremities with other complications: Secondary | ICD-10-CM

## 2016-06-30 DIAGNOSIS — Z9889 Other specified postprocedural states: Secondary | ICD-10-CM | POA: Diagnosis not present

## 2016-06-30 NOTE — Progress Notes (Signed)
Subjective:     Patient ID: [name], male   DOB: 03/21/1961, 55 y.o.   MRN: [id number]  HPI This 55 year old male returns 1 week post-laser ablation left great saphenous vein from the distal thigh to near the saphenofemoral junction. He has had some moderate discomfort along the course of the saphenous vein. He has also developed some discomfort radiating into the left hip and into his back. He went to the emergency department where he received a steroid injection but no definite diagnosis he states. He has also had had some problems with the elastic compression stocking causing some discomfort in his ankle area.  Past Medical History:  Diagnosis Date  . A-fib ([place])   . Arrhythmia   . Depression   . Depression   . Hypertension   . Low back pain   . Morbid obesity, BMI unknown ([place])   . Osteoarthritis    oa in bilateral knees  . Seizures ([place])   . Thyroid disease   . Varicose veins     Social History  Substance Use Topics  . Smoking status: Never Smoker  . Smokeless tobacco: Never Used  . Alcohol use No    Family History  Problem Relation Age of Onset  . Diabetes Father   . Heart disease Father   . Diabetes Brother   . OCD Mother     Allergies  Allergen Reactions  . Lamictal [Lamotrigine] Rash  . Metformin And Related Rash     Current Outpatient Prescriptions:  .  acetaminophen (TYLENOL) 500 MG tablet, Take 1,000 mg by mouth every 8 (eight) hours as needed for mild pain or moderate pain., Disp: , Rfl:  .  allopurinol (ZYLOPRIM) 300 MG tablet, TAKE 1 TABLET BY MOUTH EVERY DAY, Disp: 90 tablet, Rfl: 3 .  benztropine (COGENTIN) 1 MG tablet, Take 1 tablet (1 mg total) by mouth daily., Disp: 30 tablet, Rfl: 2 .  DEPAKOTE ER 250 MG 24 hr tablet, Take 2 tab in am and 2 at bed time, Disp: 120 tablet, Rfl: 2 .  desonide (DESOWEN) 0.05 % cream, Apply 1 application topically daily as needed (dry skin)., Disp: , Rfl:  .  ELIQUIS 5 MG TABS tablet, TAKE 1 TABLET BY MOUTH  TWICE A DAY, Disp: 60 tablet, Rfl: 11 .  ketoconazole (NIZORAL) 2 % shampoo, Apply 1 application topically 2 (two) times a week., Disp: 120 mL, Rfl: 0 .  levothyroxine (SYNTHROID, LEVOTHROID) 100 MCG tablet, Take 1 tablet (100 mcg total) by mouth daily., Disp: 90 tablet, Rfl: 3 .  methocarbamol (ROBAXIN) 500 MG tablet, Take 1 tablet (500 mg total) by mouth 2 (two) times daily., Disp: 20 tablet, Rfl: 0 .  Omega-3 Fatty Acids (FISH OIL) 1200 MG CPDR, Take by mouth., Disp: , Rfl:  .  perphenazine (TRILAFON) 8 MG tablet, Take 3 tablets (24 mg total) by mouth at bedtime., Disp: 90 tablet, Rfl: 2 .  phenelzine (NARDIL) 15 MG tablet, Take 3 tablets (45 mg total) by mouth 2 (two) times daily., Disp: 180 tablet, Rfl: 2 .  silver sulfADIAZINE (SILVADENE) 1 % cream, Apply 1 application topically daily as needed (for rash)., Disp: , Rfl:  .  metFORMIN (GLUCOPHAGE-XR) 500 MG 24 hr tablet, TAKE 2 TABLETS BY MOUTH EVERY DAY WITH BREAKFAST (Patient not taking: Reported on 06/30/2016), Disp: 180 tablet, Rfl: 3 .  polyethylene glycol powder (GLYCOLAX/MIRALAX) powder, Take 17 g by mouth 2 (two) times daily. Until daily soft stools  OTC (Patient not taking: Reported on  06/30/2016), Disp: 225 g, Rfl: 0  Vitals:   06/30/16 1434 06/30/16 1437  BP: (!) 147/99 130/84  Pulse: 90   Resp: 18   Temp: 98 F (36.7 C)   TempSrc: Oral   SpO2: 95%   Weight: 266 lb 12.8 oz (121 kg)   Height: 5' 10.5" (1.791 m)     Body mass index is 37.74 kg/m.         Review of Systems Denies chest pain, dyspnea on exertion, PND, orthopnea, hemoptysis    Objective:   Physical Exam BP 130/84 (BP Location: Left Arm) Comment: recheck  Pulse 90   Temp 98 F (36.7 C) (Oral)   Resp 18   Ht 5' 10.5" (1.791 m)   Wt 266 lb 12.8 oz (121 kg)   SpO2 95%   BMI 37.74 kg/m   Gen. obese male no apparent distress alert and oriented 3 Lungs no rhonchi or wheezing Cardiovascular regular rhythm no murmurs Left leg with mild to  moderate discomfort to deep palpation over great saphenous vein from distal thigh to proximal groin area. Full range of motion left hip. 2+ dorsalis pedis pulse palpable.  Today I ordered a venous duplex exam the left leg which I reviewed and interpreted. There is no DVT. There is total closure of the left great saphenous vein from the distal thigh to near the saphenofemoral junction.     Assessment:     Successful laser ablation left great saphenous vein for gross reflux with painful varicosities    Plan:     Patient to return in 3 months for evaluation to see if stab phlebectomy of residual painful varicosities will be indicated Discontinue high-dose of ibuprofen

## 2016-07-05 ENCOUNTER — Encounter (HOSPITAL_BASED_OUTPATIENT_CLINIC_OR_DEPARTMENT_OTHER): Payer: Self-pay | Admitting: Emergency Medicine

## 2016-07-05 ENCOUNTER — Emergency Department (HOSPITAL_BASED_OUTPATIENT_CLINIC_OR_DEPARTMENT_OTHER)
Admission: EM | Admit: 2016-07-05 | Discharge: 2016-07-05 | Disposition: A | Discharge status: Home / Self Care | Payer: Medicare Other | Attending: Emergency Medicine | Admitting: Emergency Medicine

## 2016-07-05 DIAGNOSIS — T148XXA Other injury of unspecified body region, initial encounter: Secondary | ICD-10-CM

## 2016-07-05 DIAGNOSIS — L089 Local infection of the skin and subcutaneous tissue, unspecified: Secondary | ICD-10-CM

## 2016-07-05 DIAGNOSIS — M545 Low back pain: Secondary | ICD-10-CM | POA: Diagnosis not present

## 2016-07-05 DIAGNOSIS — I10 Essential (primary) hypertension: Secondary | ICD-10-CM | POA: Diagnosis not present

## 2016-07-05 DIAGNOSIS — Z79899 Other long term (current) drug therapy: Secondary | ICD-10-CM | POA: Diagnosis not present

## 2016-07-05 DIAGNOSIS — Y829 Unspecified medical devices associated with adverse incidents: Secondary | ICD-10-CM | POA: Insufficient documentation

## 2016-07-05 DIAGNOSIS — T814XXA Infection following a procedure, initial encounter: Secondary | ICD-10-CM | POA: Diagnosis not present

## 2016-07-05 DIAGNOSIS — Z4800 Encounter for change or removal of nonsurgical wound dressing: Secondary | ICD-10-CM | POA: Diagnosis not present

## 2016-07-05 MED ORDER — METHOCARBAMOL 500 MG PO TABS: 500.0000 mg | ORAL_TABLET | Freq: Three times a day (TID) | ORAL | 0 refills | Status: DC | PRN

## 2016-07-05 MED ORDER — NAPROXEN 500 MG PO TABS: 500.0000 mg | ORAL_TABLET | Freq: Two times a day (BID) | ORAL | 0 refills | Status: DC

## 2016-07-05 MED ORDER — CEPHALEXIN 250 MG PO CAPS
500.0000 mg | ORAL_CAPSULE | Freq: Once | ORAL | Status: AC
  Administered 2016-07-05: 500 mg via ORAL
  Filled 2016-07-05: qty 2

## 2016-07-05 MED ORDER — CEPHALEXIN 500 MG PO CAPS: 500.0000 mg | ORAL_CAPSULE | Freq: Three times a day (TID) | ORAL | 0 refills | Status: DC

## 2016-07-05 NOTE — ED Provider Notes (Signed)
[facility] DEPT [facility] Provider Note   CSN: [id number] Arrival date & time: 07/05/16  1033     History   Chief Complaint Chief Complaint  Patient presents with  . Wound Check    HPI [name] is a 55 y.o. male. He complains of a wound on his foot.  HPI:  Patient had a recent few procedures on his legs for varicose vein injections. He was wearing some compression stockings. 4 days ago he awakened and there was a fold in the compression stocking and is top of his foot was sore. That was off there is a break in the skin. This become a little more red and draining and intermittently bleeding. He does take L quest. No fever. No streaking up the leg. Simple wound with some surrounding redness and occasional bleeding  Past Medical History:  Diagnosis Date  . A-fib ([place])   . Arrhythmia   . Depression   . Depression   . Hypertension   . Low back pain   . Morbid obesity, BMI unknown ([place])   . Osteoarthritis    oa in bilateral knees  . Seizures ([place])   . Thyroid disease   . Varicose veins     Patient Active Problem List   Diagnosis Date Noted  . Major depressive disorder, recurrent episode, mild ([place]) 03/30/2016  . Varicose veins of left lower extremity with complications 0000000  . Seborrheic keratosis 12/23/2015  . Varicose veins 12/23/2015  . Erectile dysfunction 04/26/2015  . Thyroid disease   . Seizures ([place])   . Depression   . Arrhythmia   . Obesity, Class III, BMI 40-49.9 (morbid obesity) ([place])   . Low testosterone 02/10/2007  . DERMATITIS, SEBORRHEIC 02/10/2007  . T2DM (type 2 diabetes mellitus) ([place]) 02/10/2007    Past Surgical History:  Procedure Laterality Date  . ACHILLES TENDON REPAIR  approx. 2004   right foot  . CATARACT EXTRACTION     bilateral  . TOOTH EXTRACTION         Home Medications    Prior to Admission medications   Medication Sig Start Date End Date Taking? Authorizing Provider  acetaminophen (TYLENOL) 500 MG tablet Take  1,000 mg by mouth every 8 (eight) hours as needed for mild pain or moderate pain.    Historical Provider, MD  allopurinol (ZYLOPRIM) 300 MG tablet TAKE 1 TABLET BY MOUTH EVERY DAY 12/04/15   Vivi Barrack, MD  benztropine (COGENTIN) 1 MG tablet Take 1 tablet (1 mg total) by mouth daily. 06/12/16   Kathlee Nations, MD  cephALEXin (KEFLEX) 500 MG capsule Take 1 capsule (500 mg total) by mouth 3 (three) times daily. 07/05/16   Tanna Furry, MD  DEPAKOTE ER 250 MG 24 hr tablet Take 2 tab in am and 2 at bed time 06/12/16   Kathlee Nations, MD  desonide (DESOWEN) 0.05 % cream Apply 1 application topically daily as needed (dry skin).    Historical Provider, MD  ELIQUIS 5 MG TABS tablet TAKE 1 TABLET BY MOUTH TWICE A DAY 04/06/16   Vivi Barrack, MD  ketoconazole (NIZORAL) 2 % shampoo Apply 1 application topically 2 (two) times a week. 12/23/15   Vivi Barrack, MD  levothyroxine (SYNTHROID, LEVOTHROID) 100 MCG tablet Take 1 tablet (100 mcg total) by mouth daily. 12/31/15   Vivi Barrack, MD  metFORMIN (GLUCOPHAGE-XR) 500 MG 24 hr tablet TAKE 2 TABLETS BY MOUTH EVERY DAY WITH BREAKFAST Patient not taking: Reported on 06/30/2016 06/05/16  Vivi Barrack, MD  methocarbamol (ROBAXIN) 500 MG tablet Take 1 tablet (500 mg total) by mouth 3 (three) times daily between meals as needed. 07/05/16   Tanna Furry, MD  naproxen (NAPROSYN) 500 MG tablet Take 1 tablet (500 mg total) by mouth 2 (two) times daily. 07/05/16   Tanna Furry, MD  Omega-3 Fatty Acids (FISH OIL) 1200 MG CPDR Take by mouth.    Historical Provider, MD  perphenazine (TRILAFON) 8 MG tablet Take 3 tablets (24 mg total) by mouth at bedtime. 06/12/16   Kathlee Nations, MD  phenelzine (NARDIL) 15 MG tablet Take 3 tablets (45 mg total) by mouth 2 (two) times daily. 06/12/16   Kathlee Nations, MD  polyethylene glycol powder (GLYCOLAX/MIRALAX) powder Take 17 g by mouth 2 (two) times daily. Until daily soft stools  OTC Patient not taking: Reported on 06/30/2016 03/24/15    Nona Dell, PA-C  silver sulfADIAZINE (SILVADENE) 1 % cream Apply 1 application topically daily as needed (for rash).    Historical Provider, MD    Family History Family History  Problem Relation Age of Onset  . Diabetes Father   . Heart disease Father   . Diabetes Brother   . OCD Mother     Social History Social History  Substance Use Topics  . Smoking status: Never Smoker  . Smokeless tobacco: Never Used  . Alcohol use No     Allergies   Lamictal [lamotrigine] and Metformin and related   Review of Systems Review of Systems  Constitutional: Negative for appetite change, chills, diaphoresis, fatigue and fever.  HENT: Negative for mouth sores, sore throat and trouble swallowing.   Eyes: Negative for visual disturbance.  Respiratory: Negative for cough, chest tightness, shortness of breath and wheezing.   Cardiovascular: Negative for chest pain.  Gastrointestinal: Negative for abdominal distention, abdominal pain, diarrhea, nausea and vomiting.  Endocrine: Negative for polydipsia, polyphagia and polyuria.  Genitourinary: Negative for dysuria, frequency and hematuria.  Musculoskeletal: Negative for gait problem.  Skin: Positive for wound. Negative for color change, pallor and rash.  Neurological: Negative for dizziness, syncope, light-headedness and headaches.  Hematological: Does not bruise/bleed easily.  Psychiatric/Behavioral: Negative for behavioral problems and confusion.     Physical Exam Updated Vital Signs BP 135/99 (BP Location: Right Arm)   Pulse 105   Temp 98.6 F (37 C) (Oral)   Resp 18   Ht 5\' 10"  (1.778 m)   Wt 268 lb (121.6 kg)   SpO2 95%   BMI 38.45 kg/m   Physical Exam  Constitutional: He is oriented to person, place, and time. He appears well-developed and well-nourished. No distress.  HENT:  Head: Normocephalic.  Eyes: Conjunctivae are normal. Pupils are equal, round, and reactive to light. No scleral icterus.  Neck: Normal  range of motion. Neck supple. No thyromegaly present.  Cardiovascular: Normal rate and regular rhythm.  Exam reveals no gallop and no friction rub.   No murmur heard. Pulmonary/Chest: Effort normal and breath sounds normal. No respiratory distress. He has no wheezes. He has no rales.  Abdominal: Soft. Bowel sounds are normal. He exhibits no distension. There is no tenderness. There is no rebound.  Musculoskeletal: Normal range of motion.  Neurological: He is alert and oriented to person, place, and time.  Skin: Skin is warm and dry. No rash noted.  Small break in the skin on the dorsum of the left foot with some surrounding erythema less than a centimeter. It is crusted and dry. No  active bleeding. No proximal striations.  Psychiatric: He has a normal mood and affect. His behavior is normal.     ED Treatments / Results  Labs (all labs ordered are listed, but only abnormal results are displayed) Labs Reviewed - No data to display  EKG  EKG Interpretation None       Radiology No results found.  Procedures Procedures (including critical care time)  Medications Ordered in ED Medications  cephALEXin (KEFLEX) capsule 500 mg (not administered)     Initial Impression / Assessment and Plan / ED Course  I have reviewed the triage vital signs and the nursing notes.  Pertinent labs & imaging results that were available during my care of the patient were reviewed by me and considered in my medical decision making (see chart for details).  Clinical Course    Small wound infection. No frank cellulitis. Plan Keflex. Nonocclusive dressing was placed over the wound and an Ace wrap.  Final Clinical Impressions(s) / ED Diagnoses   Final diagnoses:  Wound infection  Acute low back pain, unspecified back pain laterality, with sciatica presence unspecified    New Prescriptions New Prescriptions   CEPHALEXIN (KEFLEX) 500 MG CAPSULE    Take 1 capsule (500 mg total) by mouth 3 (three)  times daily.   METHOCARBAMOL (ROBAXIN) 500 MG TABLET    Take 1 tablet (500 mg total) by mouth 3 (three) times daily between meals as needed.   NAPROXEN (NAPROSYN) 500 MG TABLET    Take 1 tablet (500 mg total) by mouth 2 (two) times daily.     Tanna Furry, MD 07/05/16 [phone]

## 2016-07-05 NOTE — ED Notes (Signed)
Ace wrap applied by ED MD to left foot

## 2016-07-05 NOTE — ED Triage Notes (Signed)
Patient states that she has an area of bleeding and wound to his left foot. Painful when touched

## 2016-07-16 ENCOUNTER — Ambulatory Visit (INDEPENDENT_AMBULATORY_CARE_PROVIDER_SITE_OTHER): Payer: Medicare Other | Admitting: Family Medicine

## 2016-07-16 ENCOUNTER — Encounter: Payer: Self-pay | Admitting: Family Medicine

## 2016-07-16 VITALS — BP 122/82 | HR 103 | Temp 98.2°F | Ht 70.5 in | Wt 275.2 lb

## 2016-07-16 DIAGNOSIS — L821 Other seborrheic keratosis: Secondary | ICD-10-CM | POA: Diagnosis not present

## 2016-07-16 DIAGNOSIS — E118 Type 2 diabetes mellitus with unspecified complications: Secondary | ICD-10-CM | POA: Diagnosis not present

## 2016-07-16 LAB — CBC
HCT: 45.6 % (ref 38.5–50.0)
Hemoglobin: 15.1 g/dL (ref 13.2–17.1)
MCH: 30.4 pg (ref 27.0–33.0)
MCHC: 33.1 g/dL (ref 32.0–36.0)
MCV: 91.8 fL (ref 80.0–100.0)
MPV: 10.9 fL (ref 7.5–12.5)
Platelets: 246 10*3/uL (ref 140–400)
RBC: 4.97 MIL/uL (ref 4.20–5.80)
RDW: 15.2 % — ABNORMAL HIGH (ref 11.0–15.0)
WBC: 7.9 10*3/uL (ref 3.8–10.8)

## 2016-07-16 LAB — POCT GLYCOSYLATED HEMOGLOBIN (HGB A1C): Hemoglobin A1C: 6.9

## 2016-07-16 MED ORDER — GLIPIZIDE ER 5 MG PO TB24: 5.0000 mg | ORAL_TABLET | Freq: Every day | ORAL | 2 refills | Status: DC

## 2016-07-16 MED ORDER — BLOOD GLUCOSE MONITOR KIT: PACK | 0 refills | Status: DC

## 2016-07-16 NOTE — Assessment & Plan Note (Signed)
Arm lesion consistent with SK. Offered cryotherapy, however patient deferred. Return precautions reviewed.

## 2016-07-16 NOTE — Patient Instructions (Signed)
Your A1c today is 6.9. We want it to be less than 7. Start the glipizide.  Check your sugar in the morning a few times per week. If it is consistently above 150, increase your dose of glipizide to 10mg .  Let me know if you have any lows (less than 70).  We will check blood work today.  Come back to see me in 3 months.  Take care,  Dr Jerline Pain

## 2016-07-16 NOTE — Assessment & Plan Note (Addendum)
A1c elevated to 6.9 today. Patient unable to tolerate metformin. Will start glipizide 5mg . Offered DM educator and nutritionist visits, but patient deferred. Hypoglycemic warning signs discussed. Discussed lifestyle changes. Follow up in 3 months to recheck A1c.

## 2016-07-16 NOTE — Progress Notes (Signed)
    Subjective:  [name] is a 57 y.o. male who presents to the [facility] today with a chief complaint of back pain.   HPI:  T2DM Patient prescribed metformin at his last visit, thought was not able to tolerate due to a skin rash. He has not been on anything. Patient has not been exercising and thinks that he has not been eating as healthy lately due to the holidays. No polyuria or polydipsia.  Back Pain Patient with low back pain several weeks ago. Went to the Ed and was prescribed naproxen. Pain has since resolved. No weakness or numbness. No urinary incontinence.   Lesion on Arm Patient complains of itchy lesion on his right forearm. No bleeding or drainage. Lesion has been there for the last several weeks at least. Patient scratched off the lesion and has not noticed any other symptoms.  ROS: Per HPI  PMH: Smoking history reviewed.    Objective:  Physical Exam: BP 122/82   Pulse (!) 103   Temp 98.2 F (36.8 C) (Oral)   Ht 5' 10.5" (1.791 m)   Wt 275 lb 3.2 oz (124.8 kg)   SpO2 97%   BMI 38.93 kg/m   Gen: NAD, resting comfortably CV: RRR with no murmurs appreciated Pulm: NWOB, CTAB with no crackles, wheezes, or rhonchi MSK: no edema, cyanosis, or clubbing noted Skin: Small 2-31mm wax-like lesion on lateral aspect of right forearm. Neuro: grossly normal, moves all extremities Psych: Normal affect and thought content  Results for orders placed or performed in visit on 07/16/16 (from the past 72 hour(s))  HgB A1c     Status: None   Collection Time: 07/16/16  2:05 PM  Result Value Ref Range   Hemoglobin A1C 6.9    Assessment/Plan:  T2DM (type 2 diabetes mellitus) (HCC) A1c elevated to 6.9 today. Patient unable to tolerate metformin. Will start glipizide 5mg . Offered DM educator and nutritionist visits, but patient deferred. Hypoglycemic warning signs discussed. Discussed lifestyle changes. Follow up in 3 months to recheck A1c.   Seborrheic keratosis Arm lesion  consistent with SK. Offered cryotherapy, however patient deferred. Return precautions reviewed.   Back Pain Likely MSK pain. Now completely resolved. Follow up as needed.   Algis Greenhouse. Jerline Pain, [place] Resident PGY-3 07/16/2016 3:42 PM

## 2016-07-17 LAB — COMPLETE METABOLIC PANEL WITH GFR
ALT: 10 U/L (ref 9–46)
AST: 21 U/L (ref 10–35)
Albumin: 3.9 g/dL (ref 3.6–5.1)
Alkaline Phosphatase: 94 U/L (ref 40–115)
BUN: 16 mg/dL (ref 7–25)
CO2: 26 mmol/L (ref 20–31)
Calcium: 9 mg/dL (ref 8.6–10.3)
Chloride: 100 mmol/L (ref 98–110)
Creat: 0.94 mg/dL (ref 0.70–1.33)
GFR, Est African American: >89 mL/min (ref >=60)
GFR, Est Non African American: >89 mL/min (ref >=60)
Glucose, Bld: 132 mg/dL — ABNORMAL HIGH (ref 65–99)
Potassium: 4.2 mmol/L (ref 3.5–5.3)
Sodium: 138 mmol/L (ref 135–146)
Total Bilirubin: 0.5 mg/dL (ref 0.2–1.2)
Total Protein: 6.2 g/dL (ref 6.1–8.1)

## 2016-07-17 LAB — LIPID PANEL
Cholesterol: 206 mg/dL — ABNORMAL HIGH (ref <200)
HDL: 24 mg/dL — ABNORMAL LOW (ref >40)
LDL Cholesterol: 122 mg/dL — ABNORMAL HIGH (ref <100)
Total CHOL/HDL Ratio: 8.6 Ratio — ABNORMAL HIGH (ref <5.0)
Triglycerides: 299 mg/dL — ABNORMAL HIGH (ref <150)
VLDL: 60 mg/dL — ABNORMAL HIGH (ref <30)

## 2016-07-24 ENCOUNTER — Telehealth: Payer: Self-pay | Admitting: Family Medicine

## 2016-07-24 MED ORDER — ATORVASTATIN CALCIUM 40 MG PO TABS: 40.0000 mg | ORAL_TABLET | Freq: Every day | ORAL | 3 refills | Status: DC

## 2016-07-24 NOTE — Telephone Encounter (Signed)
Patient's labs notable for elevated cholesterol. 10 year ASCVD of 20%. Will start high intensity statin. Discussed with patient. He had no further questions.  Algis Greenhouse. Jerline Pain, [place] Resident PGY-3 07/24/2016 1:49 PM

## 2016-09-10 ENCOUNTER — Ambulatory Visit (INDEPENDENT_AMBULATORY_CARE_PROVIDER_SITE_OTHER): Payer: Medicare Other | Admitting: Psychiatry

## 2016-09-10 ENCOUNTER — Encounter (HOSPITAL_COMMUNITY): Payer: Self-pay | Admitting: Psychiatry

## 2016-09-10 VITALS — BP 128/80 | HR 74 | Ht 70.5 in | Wt 269.6 lb

## 2016-09-10 DIAGNOSIS — Z79899 Other long term (current) drug therapy: Secondary | ICD-10-CM

## 2016-09-10 DIAGNOSIS — Z888 Allergy status to other drugs, medicaments and biological substances status: Secondary | ICD-10-CM

## 2016-09-10 DIAGNOSIS — F330 Major depressive disorder, recurrent, mild: Secondary | ICD-10-CM

## 2016-09-10 MED ORDER — PHENELZINE SULFATE 15 MG PO TABS: 45.0000 mg | ORAL_TABLET | Freq: Two times a day (BID) | ORAL | 2 refills | Status: DC

## 2016-09-10 MED ORDER — PERPHENAZINE 8 MG PO TABS: 24.0000 mg | ORAL_TABLET | Freq: Every day | ORAL | 2 refills | Status: DC

## 2016-09-10 MED ORDER — BENZTROPINE MESYLATE 1 MG PO TABS: 1.0000 mg | ORAL_TABLET | Freq: Every day | ORAL | 2 refills | Status: DC

## 2016-09-10 MED ORDER — DEPAKOTE ER 250 MG PO TB24: ORAL_TABLET | ORAL | 2 refills | Status: DC

## 2016-09-10 NOTE — Progress Notes (Addendum)
[name]/PA/NP OP Progress Note  09/10/2016 1:20 PM [name]  MRN:  [id number]  Chief Complaint:  Chief Complaint    Follow-up     Subjective:  I'm doing fine.  I tried seeing therapist but I did not like her.  HPI: [name] came for her follow-up appointment.  He is taking his medication as prescribed.  He tried seen once [name] but he did not like the therapist.  Now he feels that he does not need therapy.  He is doing fine.  He sleeping good.  He is taking Depakote 4 times a day which is helping his mood and irritability.  He sleeping good.  He denies any agitation, crying spells or any feeling of hopelessness or worthlessness.  He admitted sometimes getting bored because he has no companion or friendship.  He has no tremors or shakes.  He is taking Nardil and does not want to change his antidepressant.  He is taking Trilafon, Depakote and Cogentin.  Recently he seen his primary care physician and his hemoglobin A1c was 6.9.  His cholesterol was high and now he is taking Lipitor.  He admitted that he needed to lose weight.  He is watching his calorie intake.  Patient denies drinking alcohol or using any illegal substances.  He lives by himself.  His energy level is okay.  Visit Diagnosis:    ICD-9-CM ICD-10-CM   1. Major depressive disorder, recurrent episode, mild (HCC) 296.31 F33.0 DEPAKOTE ER 250 MG 24 hr tablet     benztropine (COGENTIN) 1 MG tablet     perphenazine (TRILAFON) 8 MG tablet     phenelzine (NARDIL) 15 MG tablet     Valproic acid level    Past Psychiatric History: Reviewed. Patient has multiple hospitalizations from [phone] . He remember mostly he was admitted due to severe depression and having suicidal thoughts with negative thinking but denies any suicidal attempt. He was diagnosed with major depressive disorder, schizoaffective disorder and OCD. He admitted history of somewhat paranoia and intrusive thoughts in the past. Since he is taking his current  medication more than 35 years ago he is been pretty stable and is happy about it. He had tried numerous psychotherapy medication mostly TCAs. He followed outpatient services with a psychiatrist in [place] Dr. Danny Lawless, who retired couple of years ago so he started with [facility] outpatient clinic but is not satisfied and wants to change this provider. His last psychiatrist was Dr. De Nurse in [place].  Recently we tried Lamictal but he developed a rash.  Past Medical History:  Past Medical History:  Diagnosis Date  . A-fib ([place])   . Arrhythmia   . Depression   . Depression   . Hypertension   . Low back pain   . Morbid obesity, BMI unknown ([place])   . Osteoarthritis    oa in bilateral knees  . Seizures ([place])   . Thyroid disease   . Varicose veins     Past Surgical History:  Procedure Laterality Date  . ACHILLES TENDON REPAIR  approx. 2004   right foot  . CATARACT EXTRACTION     bilateral  . TOOTH EXTRACTION      Family Psychiatric History: Reviewed.  Family History:  Family History  Problem Relation Age of Onset  . Diabetes Father   . Heart disease Father   . Diabetes Brother   . OCD Mother     Social History:  Social History   Social History  . Marital status:  Single    Spouse name: N/A  . Number of children: N/A  . Years of education: N/A   Occupational History  . disabled    Social History Main Topics  . Smoking status: Never Smoker  . Smokeless tobacco: Never Used  . Alcohol use No  . Drug use: No  . Sexual activity: No   Other Topics Concern  . None   Social History Narrative  . None    Allergies:  Allergies  Allergen Reactions  . Lamictal [Lamotrigine] Rash  . Metformin And Related Rash    Metabolic Disorder Labs: Lab Results  Component Value Date   HGBA1C 6.9 07/16/2016   MPG 146 (H) 04/24/2015   MPG 140 02/05/2007   No results found for: PROLACTIN Lab Results  Component Value Date   CHOL 206 (H) 07/16/2016   TRIG 299 (H)  07/16/2016   HDL 24 (L) 07/16/2016   CHOLHDL 8.6 (H) 07/16/2016   VLDL 60 (H) 07/16/2016   LDLCALC 122 (H) 07/16/2016   LDLCALC 112 04/30/2015     Current Medications: Current Outpatient Prescriptions  Medication Sig Dispense Refill  . acetaminophen (TYLENOL) 500 MG tablet Take 1,000 mg by mouth every 8 (eight) hours as needed for mild pain or moderate pain.    Marland Kitchen allopurinol (ZYLOPRIM) 300 MG tablet TAKE 1 TABLET BY MOUTH EVERY DAY 90 tablet 3  . atorvastatin (LIPITOR) 40 MG tablet Take 1 tablet (40 mg total) by mouth daily. 90 tablet 3  . benztropine (COGENTIN) 1 MG tablet Take 1 tablet (1 mg total) by mouth daily. 30 tablet 2  . blood glucose meter kit and supplies KIT Dispense based on patient and insurance preference. Use up to four times daily as directed. (FOR ICD-9 250.00, 250.01). 1 each 0  . DEPAKOTE ER 250 MG 24 hr tablet Take 2 tab in am and 2 at bed time 120 tablet 2  . desonide (DESOWEN) 0.05 % cream Apply 1 application topically daily as needed (dry skin).    Marland Kitchen ELIQUIS 5 MG TABS tablet TAKE 1 TABLET BY MOUTH TWICE A DAY 60 tablet 11  . glipiZIDE (GLUCOTROL XL) 5 MG 24 hr tablet Take 1 tablet (5 mg total) by mouth daily with breakfast. 30 tablet 2  . ketoconazole (NIZORAL) 2 % shampoo Apply 1 application topically 2 (two) times a week. 120 mL 0  . levothyroxine (SYNTHROID, LEVOTHROID) 100 MCG tablet Take 1 tablet (100 mcg total) by mouth daily. 90 tablet 3  . methocarbamol (ROBAXIN) 500 MG tablet Take 1 tablet (500 mg total) by mouth 3 (three) times daily between meals as needed. 20 tablet 0  . naproxen (NAPROSYN) 500 MG tablet Take 1 tablet (500 mg total) by mouth 2 (two) times daily. 30 tablet 0  . Omega-3 Fatty Acids (FISH OIL) 1200 MG CPDR Take by mouth.    . perphenazine (TRILAFON) 8 MG tablet Take 3 tablets (24 mg total) by mouth at bedtime. 90 tablet 2  . phenelzine (NARDIL) 15 MG tablet Take 3 tablets (45 mg total) by mouth 2 (two) times daily. 180 tablet 2  .  polyethylene glycol powder (GLYCOLAX/MIRALAX) powder Take 17 g by mouth 2 (two) times daily. Until daily soft stools  OTC (Patient not taking: Reported on 06/30/2016) 225 g 0  . silver sulfADIAZINE (SILVADENE) 1 % cream Apply 1 application topically daily as needed (for rash).     No current facility-administered medications for this visit.     Neurologic: Headache: No Seizure: No  Paresthesias: No  Musculoskeletal: Strength & Muscle Tone: within normal limits Gait & Station: normal Patient leans: N/A  Psychiatric Specialty Exam: ROS  Blood pressure 128/80, pulse 74, height 5' 10.5" (1.791 m), weight 269 lb 9.6 oz (122.3 kg).There is no height or weight on file to calculate BMI.  General Appearance: Casual  Eye Contact:  Good  Speech:  Clear and Coherent and Normal Rate  Volume:  Normal  Mood:  Euthymic  Affect:  Appropriate and Congruent  Thought Process:  Goal Directed  Orientation:  Full (Time, Place, and Person)  Thought Content: Logical and Rumination   Suicidal Thoughts:  No  Homicidal Thoughts:  No  Memory:  Immediate;   Good Recent;   Good Remote;   Good  Judgement:  Good  Insight:  Good  Psychomotor Activity:  Normal  Concentration:  Concentration: Good and Attention Span: Good  Recall:  Good  Fund of Knowledge: Good  Language: Good  Akathisia:  No  Handed:  Right  AIMS (if indicated):  0  Assets:  Communication Skills Desire for Improvement Housing  ADL's:  Intact  Cognition: WNL  Sleep:  Good    Assessment: Major depressive disorder, recurrent.  Plan: Patient is a stable on his current psychiatric medication.  He is not interested in counseling.  I will continue Nardil 45 mg twice a day, Trilafon 24 mg at bedtime, Depakote 500 mg twice a day.  Patient has no tremors or shakes.  We will do Depakote level.  I also reviewed his blood work results, his hemoglobin A1c is 6.9.  Encourage weight loss.  Discussed medication side effects and benefits.   Recommended to call us back if he is any question, concern if he feel worsening of the symptom.  Follow-up in 3 months  Lashawnta Burgert T., Trujillo 09/10/2016, 1:20 PM

## 2016-09-14 ENCOUNTER — Encounter: Payer: Self-pay | Admitting: Surgery

## 2016-09-28 ENCOUNTER — Encounter: Payer: Self-pay | Admitting: Vascular Surgery

## 2016-09-28 ENCOUNTER — Ambulatory Visit (INDEPENDENT_AMBULATORY_CARE_PROVIDER_SITE_OTHER): Payer: Medicare Other | Admitting: Vascular Surgery

## 2016-09-28 VITALS — BP 133/84 | HR 77 | Temp 97.7°F | Resp 18 | Ht 70.0 in | Wt 267.0 lb

## 2016-09-28 DIAGNOSIS — I83893 Varicose veins of bilateral lower extremities with other complications: Secondary | ICD-10-CM | POA: Diagnosis not present

## 2016-09-28 NOTE — Progress Notes (Signed)
Subjective:     Patient ID: [name], male   DOB: 12/22/1960, 56 y.o.   MRN: [id number]  HPI  This 56 year old male returns 3 months post-laser ablation bilateral great saphenous veis for painful varicosities witswelh states the swelling has been resolved. He is having some pain  In right calf where rsidual varicosiies remain. He I having no pain in the  Left calf but s having some ai in the left foot  Past Medical History:  Diagnosis Date  . A-fib ([place])   . Arrhythmia   . Depression   . Depression   . Hypertension   . Low back pain   . Morbid obesity, BMI unknown ([place])   . Osteoarthritis    oa in bilateral knees  . Seizures ([place])   . Thyroid disease   . Varicose veins     Social History  Substance Use Topics  . Smoking status: Never Smoker  . Smokeless tobacco: Never Used  . Alcohol use No    Family History  Problem Relation Age of Onset  . Diabetes Father   . Heart disease Father   . Diabetes Brother   . OCD Mother     Allergies  Allergen Reactions  . Lamictal [Lamotrigine] Rash  . Metformin And Related Rash     Current Outpatient Prescriptions:  .  acetaminophen (TYLENOL) 500 MG tablet, Take 1,000 mg by mouth every 8 (eight) hours as needed for mild pain or moderate pain., Disp: , Rfl:  .  allopurinol (ZYLOPRIM) 300 MG tablet, TAKE 1 TABLET BY MOUTH EVERY DAY, Disp: 90 tablet, Rfl: 3 .  atorvastatin (LIPITOR) 40 MG tablet, Take 1 tablet (40 mg total) by mouth daily., Disp: 90 tablet, Rfl: 3 .  benztropine (COGENTIN) 1 MG tablet, Take 1 tablet (1 mg total) by mouth daily., Disp: 30 tablet, Rfl: 2 .  blood glucose meter kit and supplies KIT, Dispense based on patient and insurance preference. Use up to four times daily as directed. (FOR ICD-9 250.00, 250.01)., Disp: 1 each, Rfl: 0 .  DEPAKOTE ER 250 MG 24 hr tablet, Take 2 tab in am and 2 at bed time, Disp: 120 tablet, Rfl: 2 .  desonide (DESOWEN) 0.05 % cream, Apply 1 application topically daily as needed  (dry skin)., Disp: , Rfl:  .  ELIQUIS 5 MG TABS tablet, TAKE 1 TABLET BY MOUTH TWICE A DAY, Disp: 60 tablet, Rfl: 11 .  glipiZIDE (GLUCOTROL XL) 5 MG 24 hr tablet, Take 1 tablet (5 mg total) by mouth daily with breakfast., Disp: 30 tablet, Rfl: 2 .  ketoconazole (NIZORAL) 2 % shampoo, Apply 1 application topically 2 (two) times a week., Disp: 120 mL, Rfl: 0 .  levothyroxine (SYNTHROID, LEVOTHROID) 100 MCG tablet, Take 1 tablet (100 mcg total) by mouth daily., Disp: 90 tablet, Rfl: 3 .  methocarbamol (ROBAXIN) 500 MG tablet, Take 1 tablet (500 mg total) by mouth 3 (three) times daily between meals as needed., Disp: 20 tablet, Rfl: 0 .  naproxen (NAPROSYN) 500 MG tablet, Take 1 tablet (500 mg total) by mouth 2 (two) times daily., Disp: 30 tablet, Rfl: 0 .  Omega-3 Fatty Acids (FISH OIL) 1200 MG CPDR, Take by mouth., Disp: , Rfl:  .  perphenazine (TRILAFON) 8 MG tablet, Take 3 tablets (24 mg total) by mouth at bedtime., Disp: 90 tablet, Rfl: 2 .  phenelzine (NARDIL) 15 MG tablet, Take 3 tablets (45 mg total) by mouth 2 (two) times daily., Disp: 180 tablet, Rfl: 2 .  polyethylene glycol powder (GLYCOLAX/MIRALAX) powder, Take 17 g by mouth 2 (two) times daily. Until daily soft stools  OTC, Disp: 225 g, Rfl: 0 .  silver sulfADIAZINE (SILVADENE) 1 % cream, Apply 1 application topically daily as needed (for rash)., Disp: , Rfl:  .  cephALEXin (KEFLEX) 500 MG capsule, Take 500 mg by mouth 3 (three) times daily., Disp: , Rfl: 0  Vitals:   09/28/16 1325  BP: 133/84  Pulse: 77  Resp: 18  Temp: 97.7 F (36.5 C)  SpO2: 92%  Weight: 267 lb (121.1 kg)  Height: '5\' 10"'$  (1.778 m)    Body mass index is 38.31 kg/m.         Review of Systems  histor of atrial fibrillation on chronic anticoagulaton denies chespain, dyspna on exertion, orthopnea    Objective:   Physical Exam BP 133/84 (BP Location: Left Arm, Patient Position: Sitting, Cuff Size: Large)   Pulse 77   Temp 97.7 F (36.5 C)   Resp 18    Ht '5\' 10"'$  (1.778 m)   Wt 267 lb (121.1 kg)   SpO2 92%   BMI 38.31 kg/m     Janalyn Rouse- obese male in no apparent distress alert and oriented lungs   no wheezing Right leg with bulging varicosities fot e e medial malleolus over the great saphenous vein. Minimal distal edema noted. No hyperpgmentation or ulceration noted.    Assessment:      status post bilatral great saphenous vein laser ablation procedure with resida painful varicosities right leg    Plan:      patient needs multiple stab phlbectomy of rsidual varicosities rt leg area and we will prceed in the near future to perform  this and relieve his symptoms

## 2016-10-10 ENCOUNTER — Other Ambulatory Visit: Payer: Self-pay | Admitting: Family Medicine

## 2016-10-28 ENCOUNTER — Encounter: Payer: Self-pay | Admitting: Vascular Surgery

## 2016-11-09 ENCOUNTER — Encounter: Payer: Self-pay | Admitting: Vascular Surgery

## 2016-11-09 ENCOUNTER — Ambulatory Visit (INDEPENDENT_AMBULATORY_CARE_PROVIDER_SITE_OTHER): Payer: Medicare Other | Admitting: Vascular Surgery

## 2016-11-09 VITALS — BP 110/81 | HR 93 | Temp 97.3°F | Resp 16 | Ht 71.0 in | Wt 271.0 lb

## 2016-11-09 DIAGNOSIS — I83891 Varicose veins of right lower extremities with other complications: Secondary | ICD-10-CM

## 2016-11-09 HISTORY — PX: OTHER SURGICAL HISTORY: SHX169

## 2016-11-09 NOTE — Progress Notes (Signed)
Subjective:     Patient ID: [name], male   DOB: April 21, 1961, 56 y.o.   MRN: [id number]  HPI This 56 year old male had multiple stab phlebectomy-greater than 20 of painful varicosities in the right leg performed under local tumescent anesthesia. He tolerated the procedure well.  Review of Systems     Objective:   Physical Exam BP 110/81 (BP Location: Left Arm, Patient Position: Sitting, Cuff Size: Large)   Pulse 93   Temp 97.3 F (36.3 C) (Oral)   Resp 16   Ht 5\' 11"  (1.803 m)   Wt 271 lb (122.9 kg)   SpO2 93%   BMI 37.80 kg/m        Assessment:     Well-tolerated multiple stab phlebectomy of painful varicosities right leg performed under local tumescent anesthesia    Plan:     Return in 3 months for final follow-up

## 2016-11-09 NOTE — Progress Notes (Signed)
    Stab Phlebectomy Procedure  [name] DOB:1961/05/21  11/09/2016  Consent signed: Yes  Surgeon:J.D. Kellie Simmering, MD  Procedure: stab phlebectomy: right leg  BP 110/81 (BP Location: Left Arm, Patient Position: Sitting, Cuff Size: Large)   Pulse 93   Temp 97.3 F (36.3 C) (Oral)   Resp 16   Ht 5\' 11"  (1.803 m)   Wt 271 lb (122.9 kg)   SpO2 93%   BMI 37.80 kg/m   Start time: 2:30PM   End time: 3:00 PM   Tumescent Anesthesia: 200 cc 0.9% NaCl with 50 cc Bupivacaine 0.5 %  HCL and 15 cc 8.4% NaHCO3  Local Anesthesia: 8 cc Bupivacaine 0.5% HCL  Stab Phlebectomy: >20 Sites: Thigh and Calf  Patient tolerated procedure well: Yes    Description of Procedure:  After marking the course of the secondary varicosities, the patient was placed on the operating table in the supine position, and the right leg was prepped and draped in sterile fashion.    The patient was then put into Trendelenburg position.  Local anesthetic was administered at the previously marked varicosities, and tumescent anesthesia was administered around the vessels.  Greater than 20 stab wounds were made using the tip of an 11 blade. And using the vein hook, the phlebectomies were performed using a hemostat to avulse the varicosities.  Adequate hemostasis was achieved, and steri strips were applied to the stab wound.      ABD pads and thigh high compression stockings were applied as well ace wraps where needed. Blood loss was less than 15 cc.  The patient ambulated out of the operating room having tolerated the procedure well.

## 2016-11-11 ENCOUNTER — Encounter: Payer: Self-pay | Admitting: Vascular Surgery

## 2016-11-21 ENCOUNTER — Other Ambulatory Visit: Payer: Self-pay | Admitting: Family Medicine

## 2016-12-07 ENCOUNTER — Ambulatory Visit (INDEPENDENT_AMBULATORY_CARE_PROVIDER_SITE_OTHER): Payer: Medicare Other | Admitting: Psychiatry

## 2016-12-07 ENCOUNTER — Other Ambulatory Visit (HOSPITAL_COMMUNITY): Payer: Self-pay

## 2016-12-07 ENCOUNTER — Encounter (HOSPITAL_COMMUNITY): Payer: Self-pay | Admitting: Psychiatry

## 2016-12-07 ENCOUNTER — Other Ambulatory Visit (HOSPITAL_COMMUNITY): Payer: Self-pay | Admitting: Psychiatry

## 2016-12-07 DIAGNOSIS — F330 Major depressive disorder, recurrent, mild: Secondary | ICD-10-CM | POA: Diagnosis not present

## 2016-12-07 MED ORDER — PHENELZINE SULFATE 15 MG PO TABS: 45.0000 mg | ORAL_TABLET | Freq: Two times a day (BID) | ORAL | 2 refills | Status: DC

## 2016-12-07 MED ORDER — BENZTROPINE MESYLATE 1 MG PO TABS: 1.0000 mg | ORAL_TABLET | Freq: Every day | ORAL | 2 refills | Status: DC

## 2016-12-07 MED ORDER — DEPAKOTE ER 250 MG PO TB24: ORAL_TABLET | ORAL | 2 refills | Status: DC

## 2016-12-07 MED ORDER — PERPHENAZINE 8 MG PO TABS: 24.0000 mg | ORAL_TABLET | Freq: Every day | ORAL | 2 refills | Status: DC

## 2016-12-07 NOTE — Progress Notes (Signed)
BH MD/PA/NP OP Progress Note  12/07/2016 11:43 AM [name]  MRN:  [id number]  Chief Complaint:  Subjective:  I am doing good.  HPI: Patient came for his follow-up appointment.  He is taking his medication as prescribed.  He forgot his blood work but agreed to have blood drawn today.  He is sleeping good.  Sometime he regret about his relationship which was ended few months ago.  He does not want to start any relationship.  He denies any crying spells, hopelessness or any worthlessness.  He denies any suicidal thoughts.  His appetite is okay.  His energy level is good.  He does not want to change his psychiatric medication.  He is compliant with Nardil, Trilafon, Depakote and Cogentin.  He has no tremors or shakes.  He denies any paranoia or any hallucination.  He lives by himself.  Patient denies drug use.  Visit Diagnosis:    ICD-9-CM ICD-10-CM   1. Major depressive disorder, recurrent episode, mild (HCC) 296.31 F33.0 benztropine (COGENTIN) 1 MG tablet     phenelzine (NARDIL) 15 MG tablet     perphenazine (TRILAFON) 8 MG tablet     DEPAKOTE ER 250 MG 24 hr tablet    Past Psychiatric History: Reviewed. Patient has multiple hospitalizations from [phone] due to severe depression and having suicidal thoughts with negative thinking but denies any suicidal attempt. He was diagnosed with major depressive disorder, schizoaffective disorder and OCD. He admitted history of paranoia and intrusive thoughts in the past. He had tried numerous medications mostly TCAs. He was seeing outpatient services with a psychiatrist in [place] Dr. Danny Lawless but decided to establish care in this office after his physician retired.His last psychiatrist was Dr. De Nurse in [place].We tried Lamictal but he developed a rash.  Past Medical History:  Past Medical History:  Diagnosis Date  . A-fib ([place])   . Arrhythmia   . Depression   . Depression   . Hypertension   . Low back pain   . Morbid  obesity, BMI unknown ([place])   . Osteoarthritis    oa in bilateral knees  . Seizures ([place])   . Thyroid disease   . Varicose veins     Past Surgical History:  Procedure Laterality Date  . ACHILLES TENDON REPAIR  approx. 2004   right foot  . CATARACT EXTRACTION     bilateral  . stab phlebectomy  Right 11/09/2016   stab phlebectomy R leg by Tinnie Gens MD  . TOOTH EXTRACTION      Family Psychiatric History: Reviewed.  Family History:  Family History  Problem Relation Age of Onset  . Diabetes Father   . Heart disease Father   . Diabetes Brother   . OCD Mother     Social History:  Social History   Social History  . Marital status: Single    Spouse name: N/A  . Number of children: N/A  . Years of education: N/A   Occupational History  . disabled    Social History Main Topics  . Smoking status: Never Smoker  . Smokeless tobacco: Never Used  . Alcohol use No  . Drug use: No  . Sexual activity: No   Other Topics Concern  . Not on file   Social History Narrative  . No narrative on file    Allergies:  Allergies  Allergen Reactions  . Lamictal [Lamotrigine] Rash  . Metformin And Related Rash    Metabolic Disorder Labs: Lab Results  Component Value Date  HGBA1C 6.9 07/16/2016   MPG 146 (H) 04/24/2015   MPG 140 02/05/2007   No results found for: PROLACTIN Lab Results  Component Value Date   CHOL 206 (H) 07/16/2016   TRIG 299 (H) 07/16/2016   HDL 24 (L) 07/16/2016   CHOLHDL 8.6 (H) 07/16/2016   VLDL 60 (H) 07/16/2016   LDLCALC 122 (H) 07/16/2016   LDLCALC 112 04/30/2015     Current Medications: Current Outpatient Prescriptions  Medication Sig Dispense Refill  . acetaminophen (TYLENOL) 500 MG tablet Take 1,000 mg by mouth every 8 (eight) hours as needed for mild pain or moderate pain.    Marland Kitchen allopurinol (ZYLOPRIM) 300 MG tablet TAKE 1 TABLET BY MOUTH EVERY DAY 90 tablet 0  . atorvastatin (LIPITOR) 40 MG tablet Take 1 tablet (40 mg total) by mouth  daily. 90 tablet 3  . benztropine (COGENTIN) 1 MG tablet Take 1 tablet (1 mg total) by mouth daily. 30 tablet 2  . blood glucose meter kit and supplies KIT Dispense based on patient and insurance preference. Use up to four times daily as directed. (FOR ICD-9 250.00, 250.01). 1 each 0  . cephALEXin (KEFLEX) 500 MG capsule Take 500 mg by mouth 3 (three) times daily.  0  . DEPAKOTE ER 250 MG 24 hr tablet Take 2 tab in am and 2 at bed time 120 tablet 2  . desonide (DESOWEN) 0.05 % cream Apply 1 application topically daily as needed (dry skin).    Marland Kitchen ELIQUIS 5 MG TABS tablet TAKE 1 TABLET BY MOUTH TWICE A DAY (Patient not taking: Reported on 11/09/2016) 60 tablet 11  . glipiZIDE (GLUCOTROL XL) 5 MG 24 hr tablet TAKE 1 TABLET (5 MG TOTAL) BY MOUTH DAILY WITH BREAKFAST. 30 tablet 2  . ketoconazole (NIZORAL) 2 % shampoo Apply 1 application topically 2 (two) times a week. 120 mL 0  . levothyroxine (SYNTHROID, LEVOTHROID) 100 MCG tablet Take 1 tablet (100 mcg total) by mouth daily. 90 tablet 3  . methocarbamol (ROBAXIN) 500 MG tablet Take 1 tablet (500 mg total) by mouth 3 (three) times daily between meals as needed. 20 tablet 0  . naproxen (NAPROSYN) 500 MG tablet Take 1 tablet (500 mg total) by mouth 2 (two) times daily. 30 tablet 0  . Omega-3 Fatty Acids (FISH OIL) 1200 MG CPDR Take by mouth.    . perphenazine (TRILAFON) 8 MG tablet Take 3 tablets (24 mg total) by mouth at bedtime. 90 tablet 2  . phenelzine (NARDIL) 15 MG tablet Take 3 tablets (45 mg total) by mouth 2 (two) times daily. 180 tablet 2  . polyethylene glycol powder (GLYCOLAX/MIRALAX) powder Take 17 g by mouth 2 (two) times daily. Until daily soft stools  OTC 225 g 0  . silver sulfADIAZINE (SILVADENE) 1 % cream Apply 1 application topically daily as needed (for rash).     No current facility-administered medications for this visit.     Neurologic: Headache: No Seizure: No Paresthesias: No  Musculoskeletal: Strength & Muscle Tone:  within normal limits Gait & Station: normal Patient leans: N/A  Psychiatric Specialty Exam: Review of Systems  Constitutional: Negative.   HENT: Negative.   Respiratory: Negative.   Cardiovascular: Negative.   Musculoskeletal: Negative.   Skin: Negative.   Neurological: Negative.     Blood pressure 132/86, pulse 75, height _0  (1.778 m), weight 266 lb 3.2 oz (120.7 kg).There is no height or weight on file to calculate BMI.  General Appearance: Casual  Eye Contact:  Good  Speech:  Clear and Coherent  Volume:  Normal  Mood:  Euthymic  Affect:  Appropriate  Thought Process:  Goal Directed  Orientation:  Full (Time, Place, and Person)  Thought Content: WDL and Logical   Suicidal Thoughts:  No  Homicidal Thoughts:  No  Memory:  Immediate;   Fair Recent;   Good Remote;   Good  Judgement:  Good  Insight:  Good  Psychomotor Activity:  Normal  Concentration:  Concentration: Good and Attention Span: Good  Recall:  Good  Fund of Knowledge: Good  Language: Good  Akathisia:  No  Handed:  Right  AIMS (if indicated):  none  Assets:  Communication Skills Desire for Improvement Housing Physical Health Resilience  ADL's:  Intact  Cognition: WNL  Sleep:  good   Assessment: Major depressive disorder, recurrent.  Plan: Patient is a stable on his current psychiatric medication.  He is not interested in counseling.  I will continue Nardil 45 mg twice a day, Trilafon 24 mg bedtime, Cogentin 1 mg daily and Depakote 500 mg twice a day.  Discussed medication side effects and benefits.  He will have blood work for Depakote level today.  Recommended to call us back if he has any question, concern or if he feel worsening of the symptom.  Follow-up in 3 months.  Yajayra Feldt T., MD 12/07/2016, 11:43 AM

## 2016-12-10 ENCOUNTER — Other Ambulatory Visit (HOSPITAL_COMMUNITY): Payer: Self-pay | Admitting: Psychiatry

## 2016-12-10 DIAGNOSIS — F330 Major depressive disorder, recurrent, mild: Secondary | ICD-10-CM

## 2016-12-10 LAB — PLEASE NOTE

## 2016-12-10 LAB — VALPROIC ACID LEVEL: Valproic Acid Lvl: 46 ug/mL — ABNORMAL LOW (ref 50–100)

## 2016-12-14 ENCOUNTER — Other Ambulatory Visit (HOSPITAL_COMMUNITY): Payer: Self-pay | Admitting: Psychiatry

## 2016-12-25 ENCOUNTER — Other Ambulatory Visit (HOSPITAL_COMMUNITY): Payer: Self-pay | Admitting: Psychiatry

## 2016-12-25 ENCOUNTER — Other Ambulatory Visit: Payer: Self-pay | Admitting: *Deleted

## 2016-12-25 DIAGNOSIS — F330 Major depressive disorder, recurrent, mild: Secondary | ICD-10-CM

## 2016-12-28 MED ORDER — LEVOTHYROXINE SODIUM 100 MCG PO TABS: 100.0000 ug | ORAL_TABLET | Freq: Every day | ORAL | 3 refills | Status: DC

## 2016-12-28 NOTE — Telephone Encounter (Signed)
2nd request.  Zubair [name] L, RN  

## 2017-01-07 ENCOUNTER — Emergency Department (HOSPITAL_BASED_OUTPATIENT_CLINIC_OR_DEPARTMENT_OTHER): Payer: Medicare Other

## 2017-01-07 ENCOUNTER — Other Ambulatory Visit: Payer: Self-pay | Admitting: *Deleted

## 2017-01-07 ENCOUNTER — Encounter (HOSPITAL_BASED_OUTPATIENT_CLINIC_OR_DEPARTMENT_OTHER): Payer: Self-pay | Admitting: Emergency Medicine

## 2017-01-07 ENCOUNTER — Emergency Department (HOSPITAL_BASED_OUTPATIENT_CLINIC_OR_DEPARTMENT_OTHER)
Admission: EM | Admit: 2017-01-07 | Discharge: 2017-01-07 | Disposition: A | Discharge status: Home / Self Care | Payer: Medicare Other | Attending: Emergency Medicine | Admitting: Emergency Medicine

## 2017-01-07 DIAGNOSIS — Z7984 Long term (current) use of oral hypoglycemic drugs: Secondary | ICD-10-CM | POA: Diagnosis not present

## 2017-01-07 DIAGNOSIS — R51 Headache: Secondary | ICD-10-CM | POA: Insufficient documentation

## 2017-01-07 DIAGNOSIS — Z7901 Long term (current) use of anticoagulants: Secondary | ICD-10-CM | POA: Diagnosis not present

## 2017-01-07 DIAGNOSIS — R0602 Shortness of breath: Secondary | ICD-10-CM | POA: Diagnosis not present

## 2017-01-07 DIAGNOSIS — R079 Chest pain, unspecified: Secondary | ICD-10-CM | POA: Diagnosis not present

## 2017-01-07 DIAGNOSIS — I10 Essential (primary) hypertension: Secondary | ICD-10-CM | POA: Insufficient documentation

## 2017-01-07 DIAGNOSIS — R42 Dizziness and giddiness: Secondary | ICD-10-CM

## 2017-01-07 DIAGNOSIS — R519 Headache, unspecified: Secondary | ICD-10-CM

## 2017-01-07 LAB — CBC
HCT: 41.9 % (ref 39.0–52.0)
Hemoglobin: 14.1 g/dL (ref 13.0–17.0)
MCH: 31.1 pg (ref 26.0–34.0)
MCHC: 33.7 g/dL (ref 30.0–36.0)
MCV: 92.5 fL (ref 78.0–100.0)
Platelets: 186 10*3/uL (ref 150–400)
RBC: 4.53 MIL/uL (ref 4.22–5.81)
RDW: 14.6 % (ref 11.5–15.5)
WBC: 9.6 10*3/uL (ref 4.0–10.5)

## 2017-01-07 LAB — BASIC METABOLIC PANEL
Anion gap: 10 (ref 5–15)
BUN: 20 mg/dL (ref 6–20)
CO2: 27 mmol/L (ref 22–32)
Calcium: 8.9 mg/dL (ref 8.9–10.3)
Chloride: 96 mmol/L — ABNORMAL LOW (ref 101–111)
Creatinine, Ser: 0.95 mg/dL (ref 0.61–1.24)
GFR calc Af Amer: >60 mL/min (ref >60)
GFR calc non Af Amer: >60 mL/min (ref >60)
Glucose, Bld: 141 mg/dL — ABNORMAL HIGH (ref 65–99)
Potassium: 3.6 mmol/L (ref 3.5–5.1)
Sodium: 133 mmol/L — ABNORMAL LOW (ref 135–145)

## 2017-01-07 LAB — TROPONIN I: Troponin I: <0.03 ng/mL (ref <0.03)

## 2017-01-07 MED ORDER — METOCLOPRAMIDE HCL 5 MG/ML IJ SOLN
10.0000 mg | Freq: Once | INTRAMUSCULAR | Status: AC
  Administered 2017-01-07: 10 mg via INTRAVENOUS
  Filled 2017-01-07: qty 2

## 2017-01-07 MED ORDER — IOPAMIDOL (ISOVUE-370) INJECTION 76%
75.0000 mL | Freq: Once | INTRAVENOUS | Status: AC | PRN
  Administered 2017-01-07: 75 mL via INTRAVENOUS

## 2017-01-07 MED ORDER — ACETAMINOPHEN ER 650 MG PO TBCR: 650.0000 mg | EXTENDED_RELEASE_TABLET | Freq: Four times a day (QID) | ORAL | 0 refills | Status: DC

## 2017-01-07 MED ORDER — GLIPIZIDE ER 5 MG PO TB24: 5.0000 mg | ORAL_TABLET | Freq: Every day | ORAL | 2 refills | Status: DC

## 2017-01-07 MED ORDER — ACETAMINOPHEN 325 MG PO TABS
650.0000 mg | ORAL_TABLET | Freq: Once | ORAL | Status: AC
  Administered 2017-01-07: 650 mg via ORAL
  Filled 2017-01-07: qty 2

## 2017-01-07 NOTE — Discharge Instructions (Signed)
All the results in the ER are normal, labs and imaging. °We are not sure what is causing your symptoms. °The workup in the ER is not complete, and is limited to screening for life threatening and emergent conditions only, so please see a primary care doctor for further evaluation. ° °Please return to the ER if the headache gets severe and in not improving, you have associated new one sided numbness, tingling, weakness or confusion, seizures, poor balance or poor vision. °

## 2017-01-07 NOTE — ED Provider Notes (Signed)
[facility] Provider Note   CSN: [id number] Arrival date & time: 01/07/17  1918   By signing my name below, I, Collene Leyden, attest that this documentation has been prepared under the direction and in the presence of Varney Biles, MD. Electronically Signed: Collene Leyden, Scribe. 01/07/17. 8:32 PM.   History   Chief Complaint Chief Complaint  Patient presents with  . Headache    HPI Comments: [name] is a 56 y.o. male with a history of atrial fibrillation, hypertension, and seizure disorder, who presents to the Emergency Department complaining of a gradual-onset, persistent headache that began several days ago. Patient states he has had a gradually worsening mild posterior headache since yesterday. Patient states he has had not similar symptoms in the past. Patient reports associated intermittent dizziness and neck stiffness. Patient has not tried any alleviating factors or medications prior to arrival. Nothing improves or worsens his headache. Patient denies any fever, chills, numbness, tingling, weakness, visual changes, or photophobia.   Patient is also complaining of intermittent shortness of breath that began several days ago. Patient states his shortness of breath is typically exacerbated by increased physical activity such as walking up the stairs to his apartment - but that is not always the case. Pt also gets intermittent chest pain, substernal, gas type pain which gets better with ginger ale. Patient denies any history of COPD or asthma. Patient denies tobacco use. Patient denies any family history of cardiac problems. Patient denies any additional symptoms.   The history is provided by the patient. No language interpreter was used.    Past Medical History:  Diagnosis Date  . A-fib ([place])   . Arrhythmia   . Depression   . Depression   . Hypertension   . Low back pain   . Morbid obesity, BMI unknown ([place])   . Osteoarthritis    oa in bilateral knees    . Seizures ([place])   . Thyroid disease   . Varicose veins     Patient Active Problem List   Diagnosis Date Noted  . Major depressive disorder, recurrent episode, mild ([place]) 03/30/2016  . Varicose veins of right lower extremity with complications 34/09/7094  . Seborrheic keratosis 12/23/2015  . Varicose veins 12/23/2015  . Erectile dysfunction 04/26/2015  . Thyroid disease   . Seizures ([place])   . Depression   . Arrhythmia   . Obesity, Class III, BMI 40-49.9 (morbid obesity) ([place])   . Low testosterone 02/10/2007  . DERMATITIS, SEBORRHEIC 02/10/2007  . T2DM (type 2 diabetes mellitus) ([place]) 02/10/2007    Past Surgical History:  Procedure Laterality Date  . ACHILLES TENDON REPAIR  approx. 2004   right foot  . CATARACT EXTRACTION     bilateral  . stab phlebectomy  Right 11/09/2016   stab phlebectomy R leg by Tinnie Gens MD  . [place] Medications    Prior to Admission medications   Medication Sig Start Date End Date Taking? Authorizing Provider  acetaminophen (TYLENOL) 500 MG tablet Take 1,000 mg by mouth every 8 (eight) hours as needed for mild pain or moderate pain.    [provider]  allopurinol (ZYLOPRIM) 300 MG tablet TAKE 1 TABLET BY MOUTH EVERY DAY 11/23/16   Ronnie Doss M, DO  atorvastatin (LIPITOR) 40 MG tablet Take 1 tablet (40 mg total) by mouth daily. 07/24/16   Vivi Barrack, MD  benztropine (COGENTIN) 1 MG tablet Take 1 tablet (1 mg  total) by mouth daily. 12/07/16   Arfeen, Arlyce Harman, MD  blood glucose meter kit and supplies KIT Dispense based on patient and insurance preference. Use up to four times daily as directed. (FOR ICD-9 250.00, 250.01). 07/16/16   Vivi Barrack, MD  DEPAKOTE ER 250 MG 24 hr tablet Take 2 tab in am and 2 at bed time 12/07/16   Arfeen, Arlyce Harman, MD  desonide (DESOWEN) 0.05 % cream Apply 1 application topically daily as needed (dry skin).    [provider]  ELIQUIS 5 MG TABS tablet TAKE 1 TABLET BY  MOUTH TWICE A DAY Patient not taking: Reported on 11/09/2016 04/06/16   Vivi Barrack, MD  glipiZIDE (GLUCOTROL XL) 5 MG 24 hr tablet Take 1 tablet (5 mg total) by mouth daily with breakfast. 01/07/17   Mercy Riding, MD  ketoconazole (NIZORAL) 2 % shampoo Apply 1 application topically 2 (two) times a week. 12/23/15   Vivi Barrack, MD  levothyroxine (SYNTHROID, LEVOTHROID) 100 MCG tablet Take 1 tablet (100 mcg total) by mouth daily. 12/28/16   Vivi Barrack, MD  methocarbamol (ROBAXIN) 500 MG tablet Take 1 tablet (500 mg total) by mouth 3 (three) times daily between meals as needed. 07/05/16   Tanna Furry, MD  naproxen (NAPROSYN) 500 MG tablet Take 1 tablet (500 mg total) by mouth 2 (two) times daily. 07/05/16   Tanna Furry, MD  Omega-3 Fatty Acids (FISH OIL) 1200 MG CPDR Take by mouth.    [provider]  perphenazine (TRILAFON) 8 MG tablet Take 3 tablets (24 mg total) by mouth at bedtime. 12/07/16   Arfeen, Arlyce Harman, MD  phenelzine (NARDIL) 15 MG tablet Take 3 tablets (45 mg total) by mouth 2 (two) times daily. 12/07/16   Arfeen, Arlyce Harman, MD  polyethylene glycol powder (GLYCOLAX/MIRALAX) powder Take 17 g by mouth 2 (two) times daily. Until daily soft stools  OTC 03/24/15   Nona Dell, PA-C  silver sulfADIAZINE (SILVADENE) 1 % cream Apply 1 application topically daily as needed (for rash).    [provider]    Family History Family History  Problem Relation Age of Onset  . Diabetes Father   . Heart disease Father   . Diabetes Brother   . OCD Mother     Social History Social History  Substance Use Topics  . Smoking status: Never Smoker  . Smokeless tobacco: Never Used  . Alcohol use No     Allergies   Lamictal [lamotrigine] and Metformin and related   Review of Systems Review of Systems  Constitutional: Negative for chills, diaphoresis and fever.  Eyes: Negative for photophobia and visual disturbance.  Respiratory: Positive for shortness of breath.     Musculoskeletal: Positive for neck stiffness.  Neurological: Positive for dizziness and headaches. Negative for speech difficulty, weakness and numbness.  All other systems reviewed and are negative.    Physical Exam Updated Vital Signs BP (!) 123/91   Pulse 87   Temp 98 F (36.7 C) (Oral)   Resp 18   Wt 120.7 kg (266 lb)   SpO2 97%   BMI 38.17 kg/m   Physical Exam  Constitutional: He is oriented to person, place, and time. He appears well-developed and well-nourished.  HENT:  Head: Normocephalic and atraumatic.  Mouth/Throat: Oropharynx is clear and moist.  No facial droop.   Eyes: Conjunctivae and EOM are normal. Pupils are equal, round, and reactive to light.  No nystagmus  Neck: Normal range of  motion. Neck supple.  No nuchal rigidity.  Dizziness worse with movement of the neck  Cardiovascular: Normal rate and regular rhythm.   Pulmonary/Chest: Effort normal and breath sounds normal.  Abdominal: Soft. Bowel sounds are normal.  Musculoskeletal: Normal range of motion. He exhibits no tenderness.  Gross upper and lower extremity sensory and motor exam is normal.   Neurological: He is alert and oriented to person, place, and time. No cranial nerve deficit. Coordination normal.  Cerebellar exam is normal (finger to nose) Sensory exam normal for bilateral upper and lower extremities - and patient is able to discriminate between sharp and dull. Motor exam is 4+/5   Skin: Skin is warm and dry. He is not diaphoretic.  Psychiatric: He has a normal mood and affect.  Nursing note and vitals reviewed.    ED Treatments / Results  DIAGNOSTIC STUDIES: Oxygen Saturation is 94% on RA, adequate by my interpretation.    COORDINATION OF CARE: 8:32 PM Discussed treatment plan with pt at bedside and pt agreed to plan, which includes imaging and a migraine cocktail.   Labs (all labs ordered are listed, but only abnormal results are displayed) Labs Reviewed  BASIC METABOLIC PANEL  - Abnormal; Notable for the following:       Result Value   Sodium 133 (*)    Chloride 96 (*)    Glucose, Bld 141 (*)    All other components within normal limits  CBC  TROPONIN I    EKG  EKG Interpretation  Date/Time:  Thursday January 07 2017 19:30:49 EDT Ventricular Rate:  84 PR Interval:    QRS Duration: 78 QT Interval:  370 QTC Calculation: 437 R Axis:   41 Text Interpretation:  Atrial fibrillation Septal infarct , age undetermined Abnormal ECG No acute changes No significant change since last tracing Confirmed by Varney Biles ([id number]) on 01/07/2017 8:47:27 PM       Radiology Dg Chest 2 View  Result Date: 01/07/2017 CLINICAL DATA:  Chest pain and shortness of breath.  Dizziness. EXAM: CHEST  2 VIEW COMPARISON:  CT chest 04/28/2014.  Chest x-ray 04/28/2014. FINDINGS: The lungs are clear without focal pneumonia, edema, pneumothorax or pleural effusion. Interstitial markings are diffusely coarsened with chronic features. Linear scarring left base is unchanged. No change in the nodular density overlying the right first rib, seen to be bony and origin on the previous CT scan. Lungs appear hyperexpanded. IMPRESSION: Stable exam. Underlying chronic interstitial changes without acute cardiopulmonary findings. Electronically Signed   By: Misty Stanley M.D.   On: 01/07/2017 20:20    Procedures Procedures (including critical care time)  Medications Ordered in ED Medications  metoCLOPramide (REGLAN) injection 10 mg (not administered)  iopamidol (ISOVUE-370) 76 % injection 75 mL (not administered)     Initial Impression / Assessment and Plan / ED Course  I have reviewed the triage vital signs and the nursing notes.  Pertinent labs & imaging results that were available during my care of the patient were reviewed by me and considered in my medical decision making (see chart for details).     Pt comes in with main complain of neck pain, headaches and dizziness. Dizziness is present  when he gets up, but also worse with movement of the neck. Pt has no other neurologic complains and his neuro exam is non focal. The dizziness also appears like it could be orthostasis - but since there is dizziness with movement of the neck and new posterior headache, we will get CT  angio.  EKG is reassuring. Trop is neg. Pt has had chest pain and dib intermittently, and w/o specific association with exertion. Pt has no hx of PE, DVT and denies any exogenous hormone use, long distance travels or surgery in the past 6 weeks, active cancer, recent immobilization. Could be related to his AF....    Final Clinical Impressions(s) / ED Diagnoses   Final diagnoses:  None    New Prescriptions New Prescriptions   No medications on file   I personally performed the services described in this documentation, which was scribed in my presence. The recorded information has been reviewed and is accurate.     Varney Biles, MD 01/07/17 2053

## 2017-01-07 NOTE — ED Notes (Signed)
Patient transported to CT 

## 2017-01-07 NOTE — ED Triage Notes (Signed)
Pt states he was having SOB since yesterday with chest pressure

## 2017-01-07 NOTE — ED Notes (Signed)
ED Provider at bedside. 

## 2017-01-20 ENCOUNTER — Ambulatory Visit (INDEPENDENT_AMBULATORY_CARE_PROVIDER_SITE_OTHER): Payer: Medicare Other | Admitting: Internal Medicine

## 2017-01-20 ENCOUNTER — Encounter: Payer: Self-pay | Admitting: Internal Medicine

## 2017-01-20 VITALS — BP 118/82 | HR 75 | Temp 98.1°F | Ht 70.0 in | Wt 269.0 lb

## 2017-01-20 DIAGNOSIS — L299 Pruritus, unspecified: Secondary | ICD-10-CM | POA: Diagnosis not present

## 2017-01-20 DIAGNOSIS — E119 Type 2 diabetes mellitus without complications: Secondary | ICD-10-CM

## 2017-01-20 DIAGNOSIS — E118 Type 2 diabetes mellitus with unspecified complications: Secondary | ICD-10-CM

## 2017-01-20 LAB — POCT GLYCOSYLATED HEMOGLOBIN (HGB A1C): Hemoglobin A1C: 5.9

## 2017-01-20 MED ORDER — HYDROCORTISONE 2.5 % EX OINT: TOPICAL_OINTMENT | Freq: Two times a day (BID) | CUTANEOUS | 3 refills | Status: DC

## 2017-01-20 NOTE — Progress Notes (Signed)
   [facility] Phone: [phone]  Subjective:  [name] is a 56 year old male presenting to clinic for follow-up of diabetes and to discuss a rash.  T2DM: Taking Glipizide 5mg  daily. Has been unable to tolerate Metformin the past due to side effects.  No shakiness or sweatiness. Does not feel that his blood sugars have been running low.  Rash: Located on his forearms and chest. Rash is itchy. He has not noticed any bumps, just that his skin is itchy. Occurs every day, periodically throughout the day. Nothing makes the itchiness occur. Has not tried any lotions or creams. No new soaps or laundry detergents.  ROS: See HPI for pertinent positives and negatives  Past Medical History- T2DM, seizures, depression, morbid obesity  Family history reviewed for today's visit. No changes.  Social history- patient is a never smoker  Objective: BP 118/82   Pulse 75   Temp 98.1 F (36.7 C) (Oral)   Ht 5\' 10"  (1.778 m)   Wt 269 lb (122 kg)   SpO2 96%   BMI 38.60 kg/m  Gen: NAD, alert, cooperative with exam CV: RRR, no murmur Resp: CTABL, no wheezes, normal work of breathing Diabetic foot: Small warty lesion present in the middle of the right forefoot; no other deformities, ulcerations, or skin breakdown noted; intact to touch and monofilament testing bilaterally; PT and DP pulses intact bilaterally. Skin: No lesions or rashes noted; erythematous excoriations present on the chest.  Assessment/Plan: T2DM: Well-controlled. Last A1c 07/16/16 was 6.9%.  - Repeat A1c today was 5.9%. - Continue Glipizide 5mg  daily - Follow-up in 6 months for diabetes visit. May need to decrease or stop Glipizide at that time if A1c continues to be that low.  Itchy skin: No lesions appreciated, but excoriations present. - Advised patient to use lotion twice a day - Prescribed Hydrocortisone 2.5% ointment to help with itching - Return if no improvement. Could consider getting CMP to rule  out cholestasis as a cause for his itchy skin.  Hyman Bible, MD PGY-3

## 2017-01-20 NOTE — Patient Instructions (Signed)
It was so nice to meet you!  For the callus on your foot, I would recommend soaking your foot in warm water for 10-15 minutes, then using a nail file to gently file down the callus. You can also use a corn pad to help protect it.  Your A1c was 5.9% today! Congratulations! Please keep taking the Glipizide.   For your rash, I would recommend using lotion at least once a day. I have prescribed a steroid cream for you to use when the itching is really bad.  We will see you back in 6 months!  -Dr. Brett Albino

## 2017-01-22 DIAGNOSIS — L299 Pruritus, unspecified: Secondary | ICD-10-CM | POA: Insufficient documentation

## 2017-01-22 NOTE — Assessment & Plan Note (Signed)
No lesions appreciated, but excoriations present. - Advised patient to use lotion twice a day - Prescribed Hydrocortisone 2.5% ointment to help with itching - Return if no improvement. Could consider getting CMP to rule out cholestasis as a cause for his itchy skin.

## 2017-01-22 NOTE — Assessment & Plan Note (Signed)
Well-controlled. Last A1c 07/16/16 was 6.9%.  - Repeat A1c today was 5.9%. - Continue Glipizide 5mg  daily - Follow-up in 6 months for diabetes visit. May need to decrease or stop Glipizide at that time if A1c continues to be that low.

## 2017-01-25 ENCOUNTER — Encounter: Payer: Self-pay | Admitting: Vascular Surgery

## 2017-01-28 ENCOUNTER — Other Ambulatory Visit (HOSPITAL_COMMUNITY): Payer: Self-pay | Admitting: Psychiatry

## 2017-01-28 DIAGNOSIS — F330 Major depressive disorder, recurrent, mild: Secondary | ICD-10-CM

## 2017-02-09 ENCOUNTER — Ambulatory Visit (INDEPENDENT_AMBULATORY_CARE_PROVIDER_SITE_OTHER): Payer: Medicare Other | Admitting: Vascular Surgery

## 2017-02-09 ENCOUNTER — Encounter: Payer: Self-pay | Admitting: Vascular Surgery

## 2017-02-09 VITALS — BP 136/95 | HR 80 | Temp 99.0°F | Resp 18 | Ht 70.0 in | Wt 271.0 lb

## 2017-02-09 DIAGNOSIS — I83893 Varicose veins of bilateral lower extremities with other complications: Secondary | ICD-10-CM | POA: Diagnosis not present

## 2017-02-09 NOTE — Progress Notes (Signed)
Subjective:     Patient ID: [name], male   DOB: [birth year], 58 y.o.   MRN: [id number]  HPI This 56 year old male returns 3 months following laser ablation bilateral great saphenous veins and multiple stab phlebectomy in the right leg of painful varicosities. He states that he is having no pain in his legs. He has no swelling. He is not wearing elastic compression stockings. His stab phlebectomy sites in the right calf are very prominent to him in appearance but asymptomatic.  Past Medical History:  Diagnosis Date  . A-fib ([place])   . Arrhythmia   . Depression   . Depression   . Hypertension   . Low back pain   . Morbid obesity, BMI unknown ([place])   . Osteoarthritis    oa in bilateral knees  . Seizures ([place])   . Thyroid disease   . Varicose veins     Social History  Substance Use Topics  . Smoking status: Never Smoker  . Smokeless tobacco: Never Used  . Alcohol use No    Family History  Problem Relation Age of Onset  . Diabetes Father   . Heart disease Father   . Diabetes Brother   . OCD Mother     Allergies  Allergen Reactions  . Lamictal [Lamotrigine] Rash  . Metformin And Related Rash     Current Outpatient Prescriptions:  .  allopurinol (ZYLOPRIM) 300 MG tablet, TAKE 1 TABLET BY MOUTH EVERY DAY, Disp: 90 tablet, Rfl: 0 .  benztropine (COGENTIN) 1 MG tablet, Take 1 tablet (1 mg total) by mouth daily., Disp: 30 tablet, Rfl: 2 .  DEPAKOTE ER 250 MG 24 hr tablet, Take 2 tab in am and 2 at bed time, Disp: 120 tablet, Rfl: 2 .  desonide (DESOWEN) 0.05 % cream, Apply 1 application topically daily as needed (dry skin)., Disp: , Rfl:  .  ELIQUIS 5 MG TABS tablet, TAKE 1 TABLET BY MOUTH TWICE A DAY, Disp: 60 tablet, Rfl: 11 .  glipiZIDE (GLUCOTROL XL) 5 MG 24 hr tablet, Take 1 tablet (5 mg total) by mouth daily with breakfast., Disp: 30 tablet, Rfl: 2 .  hydrocortisone 2.5 % ointment, Apply topically 2 (two) times daily. As needed for mild eczema.  Do not use for more  than 1-2 weeks at a time., Disp: 30 g, Rfl: 3 .  ketoconazole (NIZORAL) 2 % shampoo, Apply 1 application topically 2 (two) times a week., Disp: 120 mL, Rfl: 0 .  levothyroxine (SYNTHROID, LEVOTHROID) 100 MCG tablet, Take 1 tablet (100 mcg total) by mouth daily., Disp: 90 tablet, Rfl: 3 .  perphenazine (TRILAFON) 8 MG tablet, Take 3 tablets (24 mg total) by mouth at bedtime., Disp: 90 tablet, Rfl: 2 .  phenelzine (NARDIL) 15 MG tablet, Take 3 tablets (45 mg total) by mouth 2 (two) times daily., Disp: 180 tablet, Rfl: 2 .  silver sulfADIAZINE (SILVADENE) 1 % cream, Apply 1 application topically daily as needed (for rash)., Disp: , Rfl:  .  acetaminophen (TYLENOL 8 HOUR) 650 MG CR tablet, Take 1 tablet (650 mg total) by mouth every 6 (six) hours. (Patient not taking: Reported on 02/09/2017), Disp: 30 tablet, Rfl: 0 .  atorvastatin (LIPITOR) 40 MG tablet, Take 1 tablet (40 mg total) by mouth daily. (Patient not taking: Reported on 02/09/2017), Disp: 90 tablet, Rfl: 3 .  blood glucose meter kit and supplies KIT, Dispense based on patient and insurance preference. Use up to four times daily as directed. (FOR ICD-9 250.00, 250.01). (  Patient not taking: Reported on 02/09/2017), Disp: 1 each, Rfl: 0 .  methocarbamol (ROBAXIN) 500 MG tablet, Take 1 tablet (500 mg total) by mouth 3 (three) times daily between meals as needed. (Patient not taking: Reported on 02/09/2017), Disp: 20 tablet, Rfl: 0 .  naproxen (NAPROSYN) 500 MG tablet, Take 1 tablet (500 mg total) by mouth 2 (two) times daily. (Patient not taking: Reported on 02/09/2017), Disp: 30 tablet, Rfl: 0 .  Omega-3 Fatty Acids (FISH OIL) 1200 MG CPDR, Take by mouth., Disp: , Rfl:  .  polyethylene glycol powder (GLYCOLAX/MIRALAX) powder, Take 17 g by mouth 2 (two) times daily. Until daily soft stools  OTC (Patient not taking: Reported on 02/09/2017), Disp: 225 g, Rfl: 0  Vitals:   02/09/17 1405  BP: (!) 136/95  Pulse: 80  Resp: 18  Temp: 99 F (37.2 C)  TempSrc:  Oral  SpO2: 90%  Weight: 271 lb (122.9 kg)  Height: '5\' 10"'$  (1.778 m)    Body mass index is 38.88 kg/m.         Review of Systems denies chest pain but does have is now exertion and occasional orthopnea. Occasionally will have some mild dizziness after arising that this resolves. This has been a chronic problem. Is on chronic anticoagulation.    Objective:   Physical Exam BP (!) 136/95 (BP Location: Left Arm, Patient Position: Sitting, Cuff Size: Large)   Pulse 80   Temp 99 F (37.2 C) (Oral)   Resp 18   Ht '5\' 10"'$  (1.778 m)   Wt 271 lb (122.9 kg)   SpO2 90%   BMI 38.88 kg/m   General morbidly obese male no apparent distress alert and oriented 3 Lungs no rhonchi or wheezing Cardiovascular regular rhythm no murmurs Right leg with all stab phlebectomy sites having healed nicely in the distal thigh and medial calf with no distal edema and 2+ to cells pedis pulse palpable Left leg has a few very small varicosities in the medial calf which are not significant enough to require treatment. No distal edema. 2 posterior cells pedis pulse palpable.     Assessment:     Good result following bilateral great saphenous vein ablation and multiple stab phlebectomy of painful varicosities in the right leg-no further treatment needed    Plan:     Patient return to see me on a when necessary basis

## 2017-02-15 ENCOUNTER — Other Ambulatory Visit (HOSPITAL_COMMUNITY): Payer: Self-pay | Admitting: Psychiatry

## 2017-02-21 ENCOUNTER — Other Ambulatory Visit (HOSPITAL_COMMUNITY): Payer: Self-pay | Admitting: Psychiatry

## 2017-02-21 DIAGNOSIS — F330 Major depressive disorder, recurrent, mild: Secondary | ICD-10-CM

## 2017-03-01 ENCOUNTER — Encounter (HOSPITAL_COMMUNITY): Payer: Self-pay | Admitting: Psychiatry

## 2017-03-01 ENCOUNTER — Ambulatory Visit (INDEPENDENT_AMBULATORY_CARE_PROVIDER_SITE_OTHER): Payer: Medicare Other | Admitting: Psychiatry

## 2017-03-01 DIAGNOSIS — E119 Type 2 diabetes mellitus without complications: Secondary | ICD-10-CM | POA: Diagnosis not present

## 2017-03-01 DIAGNOSIS — F330 Major depressive disorder, recurrent, mild: Secondary | ICD-10-CM | POA: Diagnosis not present

## 2017-03-01 DIAGNOSIS — H524 Presbyopia: Secondary | ICD-10-CM | POA: Diagnosis not present

## 2017-03-01 MED ORDER — BENZTROPINE MESYLATE 1 MG PO TABS: 1.0000 mg | ORAL_TABLET | Freq: Every day | ORAL | 2 refills | Status: DC

## 2017-03-01 MED ORDER — PERPHENAZINE 8 MG PO TABS: 24.0000 mg | ORAL_TABLET | Freq: Every day | ORAL | 2 refills | Status: DC

## 2017-03-01 MED ORDER — PHENELZINE SULFATE 15 MG PO TABS: 45.0000 mg | ORAL_TABLET | Freq: Two times a day (BID) | ORAL | 2 refills | Status: DC

## 2017-03-01 MED ORDER — DEPAKOTE ER 250 MG PO TB24: ORAL_TABLET | ORAL | 2 refills | Status: DC

## 2017-03-01 NOTE — Progress Notes (Signed)
BH MD/PA/NP OP Progress Note  03/01/2017 11:33 AM [name]  MRN:  [id number]  Chief Complaint:  I'm doing good.  I'm sleeping good.  HPI: [name] came for his follow-up appointment.  He is taking his medication and reported no side effects.  He was seen in the emergency room 6 weeks ago because of headaches and he had CT scan but no new medication added.  His CT scan was normal.  Overall he described his mood is good.  His Depakote level is 46.  He really liked his current psychiatric medication.  He is not interested in relationship since the last relationship ended with problems.  Sometime he had difficulty falling asleep but he is able to get 6-8 hours.  He denies any irritability, anger, mania or any psychosis.  He has no tremors, shakes or any EPS.  He like to continue Nardil, Trilafon, Depakote and Cogentin.  He is happy his hemoglobin A1c is also dropped from the past.  His watching his calorie intake.  His weight is stable.  He denies any hallucination or any suicidal thoughts.  His energy level is fair.  He denies drinking alcohol or using any illegal substances.  He lives by himself.  For past few days he is complaining of right redeye with some time itching.  Today he is scheduled to see his eye physician Dr. Bing Plume.  In the past he has eye vessels ruptured and he believed it may happen again.  However he has no pain or any discharge.    Visit Diagnosis: No diagnosis found.  Past Psychiatric History: Reviewed. Patient has multiple hospitalizations from [id number] due to severe depression and having suicidal thoughts with negative thinking but denies any suicidal attempt. He was diagnosed with major depressive disorder, schizoaffective disorder and OCD. He admitted history of paranoia and intrusive thoughts in the past. He had tried numerous medications mostly TCAs. He was seeing outpatient services with a psychiatrist in [place] Dr. Danny Lawless but decided to establish care in this  office after his physician retired.His last psychiatrist was Dr. De Nurse in [place].We tried Lamictal but he developed a rash.  Past Medical History:  Past Medical History:  Diagnosis Date  . A-fib ([place])   . Arrhythmia   . Depression   . Depression   . Hypertension   . Low back pain   . Morbid obesity, BMI unknown ([name])   . Osteoarthritis    oa in bilateral knees  . Seizures ([place])   . Thyroid disease   . Varicose veins     Past Surgical History:  Procedure Laterality Date  . ACHILLES TENDON REPAIR  approx. 2004   right foot  . CATARACT EXTRACTION     bilateral  . stab phlebectomy  Right 11/09/2016   stab phlebectomy R leg by Tinnie Gens MD  . TOOTH EXTRACTION      Family Psychiatric History: Reviewed.  Family History:  Family History  Problem Relation Age of Onset  . Diabetes Father   . Heart disease Father   . Diabetes Brother   . OCD Mother     Social History:  Social History   Social History  . Marital status: Single    Spouse name: N/A  . Number of children: N/A  . Years of education: N/A   Occupational History  . disabled    Social History Main Topics  . Smoking status: Never Smoker  . Smokeless tobacco: Never Used  . Alcohol use No  . Drug  use: No  . Sexual activity: No   Other Topics Concern  . None   Social History Narrative  . None    Allergies:  Allergies  Allergen Reactions  . Lamictal [Lamotrigine] Rash  . Metformin And Related Rash    Metabolic Disorder Labs: Lab Results  Component Value Date   HGBA1C 5.9 01/20/2017   MPG 146 (H) 04/24/2015   MPG 140 02/05/2007   No results found for: PROLACTIN Lab Results  Component Value Date   CHOL 206 (H) 07/16/2016   TRIG 299 (H) 07/16/2016   HDL 24 (L) 07/16/2016   CHOLHDL 8.6 (H) 07/16/2016   VLDL 60 (H) 07/16/2016   LDLCALC 122 (H) 07/16/2016   LDLCALC 112 04/30/2015   Lab Results  Component Value Date   TSH 1.23 12/23/2015   TSH 2.076 04/30/2015     Therapeutic Level Labs: No results found for: LITHIUM Lab Results  Component Value Date   VALPROATE 46 (L) 12/07/2016   VALPROATE 61.3 09/09/2015   No components found for:  CBMZ  Current Medications: Current Outpatient Prescriptions  Medication Sig Dispense Refill  . allopurinol (ZYLOPRIM) 300 MG tablet TAKE 1 TABLET BY MOUTH EVERY DAY 90 tablet 0  . benztropine (COGENTIN) 1 MG tablet Take 1 tablet (1 mg total) by mouth daily. 30 tablet 2  . DEPAKOTE ER 250 MG 24 hr tablet Take 2 tab in am and 2 at bed time 120 tablet 2  . desonide (DESOWEN) 0.05 % cream Apply 1 application topically daily as needed (dry skin).    Marland Kitchen ELIQUIS 5 MG TABS tablet TAKE 1 TABLET BY MOUTH TWICE A DAY 60 tablet 11  . glipiZIDE (GLUCOTROL XL) 5 MG 24 hr tablet Take 1 tablet (5 mg total) by mouth daily with breakfast. 30 tablet 2  . ketoconazole (NIZORAL) 2 % shampoo Apply 1 application topically 2 (two) times a week. 120 mL 0  . levothyroxine (SYNTHROID, LEVOTHROID) 100 MCG tablet Take 1 tablet (100 mcg total) by mouth daily. 90 tablet 3  . perphenazine (TRILAFON) 8 MG tablet Take 3 tablets (24 mg total) by mouth at bedtime. 90 tablet 2  . phenelzine (NARDIL) 15 MG tablet Take 3 tablets (45 mg total) by mouth 2 (two) times daily. 180 tablet 2  . silver sulfADIAZINE (SILVADENE) 1 % cream Apply 1 application topically daily as needed (for rash).     No current facility-administered medications for this visit.      Musculoskeletal: Strength & Muscle Tone: within normal limits Gait & Station: normal Patient leans: N/A  Psychiatric Specialty Exam: Review of Systems  Constitutional: Negative.   HENT: Negative.   Eyes:       Right red eye  Respiratory: Negative.   Cardiovascular: Negative.   Genitourinary: Negative.   Musculoskeletal: Negative.   Skin: Negative.  Negative for rash.  Neurological: Positive for dizziness, tingling and tremors.    Blood pressure 132/84, pulse 87, height 5' 10.5"  (1.791 m), weight 270 lb 12.8 oz (122.8 kg).Body mass index is 38.31 kg/m.  General Appearance: Casual  Eye Contact:  Good  Speech:  Clear and Coherent  Volume:  Normal  Mood:  Euthymic  Affect:  Appropriate  Thought Process:  Goal Directed  Orientation:  Full (Time, Place, and Person)  Thought Content: Logical   Suicidal Thoughts:  No  Homicidal Thoughts:  No  Memory:  Immediate;   Good Recent;   Good Remote;   Good  Judgement:  Good  Insight:  Good  Psychomotor Activity:  Normal  Concentration:  Concentration: Good and Attention Span: Good  Recall:  Good  Fund of Knowledge: Good  Language: Good  Akathisia:  No  Handed:  Right  AIMS (if indicated): not done  Assets:  Communication Skills Desire for Improvement Housing Resilience Social Support  ADL's:  Intact  Cognition: WNL  Sleep:  Fair   Screenings: PHQ2-9     Office Visit from 12/23/2015 in [facility] Office Visit from 06/03/2015 in [facility] Counselor from 05/21/2015 in [facility] Office Visit from 04/26/2015 in [facility]  PHQ-2 Total Score  0  3  1  6   PHQ-9 Total Score  -  5  -  -       Assessment and Plan: Patient is 56 year old man with diagnoses of major depressive disorder recurrent mild.  I reviewed blood work results his Depakote level is 46.  His hemoglobin A1c is also improved from the past.  He has no shakes, tremors, rash or any itching.  Today he is going to see his eye physician for redness in his right eye.  He is not interested in counseling.  I will continue Nardil 45 mg twice a day, Trilafon 24 mg bedtime, Cogentin 1 mg daily and Depakote 500 mg twice a day.  Patient is aware about dietary restriction related to Mono mine oxidase inhibitors.  Discussed medication side effects and benefits.  Recommended to call us back if he has any question or any concern.  Follow-up in 3 months.  Time spent  25 minutes.   Astrid Vides T., MD 03/01/2017, 11:33 AM

## 2017-03-02 ENCOUNTER — Other Ambulatory Visit (HOSPITAL_COMMUNITY): Payer: Self-pay

## 2017-03-02 DIAGNOSIS — F330 Major depressive disorder, recurrent, mild: Secondary | ICD-10-CM

## 2017-03-02 MED ORDER — PHENELZINE SULFATE 15 MG PO TABS: 45.0000 mg | ORAL_TABLET | Freq: Two times a day (BID) | ORAL | 0 refills | Status: DC

## 2017-03-03 ENCOUNTER — Other Ambulatory Visit: Payer: Self-pay | Admitting: Family Medicine

## 2017-03-14 ENCOUNTER — Other Ambulatory Visit: Payer: Self-pay | Admitting: Internal Medicine

## 2017-03-25 ENCOUNTER — Emergency Department (HOSPITAL_BASED_OUTPATIENT_CLINIC_OR_DEPARTMENT_OTHER): Payer: Medicare Other

## 2017-03-25 ENCOUNTER — Encounter (HOSPITAL_BASED_OUTPATIENT_CLINIC_OR_DEPARTMENT_OTHER): Payer: Self-pay

## 2017-03-25 ENCOUNTER — Emergency Department (HOSPITAL_BASED_OUTPATIENT_CLINIC_OR_DEPARTMENT_OTHER)
Admission: EM | Admit: 2017-03-25 | Discharge: 2017-03-26 | Disposition: A | Discharge status: Home / Self Care | Payer: Medicare Other | Attending: Emergency Medicine | Admitting: Emergency Medicine

## 2017-03-25 DIAGNOSIS — I482 Chronic atrial fibrillation, unspecified: Secondary | ICD-10-CM

## 2017-03-25 DIAGNOSIS — Z7984 Long term (current) use of oral hypoglycemic drugs: Secondary | ICD-10-CM | POA: Insufficient documentation

## 2017-03-25 DIAGNOSIS — E119 Type 2 diabetes mellitus without complications: Secondary | ICD-10-CM | POA: Diagnosis not present

## 2017-03-25 DIAGNOSIS — R0789 Other chest pain: Secondary | ICD-10-CM

## 2017-03-25 DIAGNOSIS — Z79899 Other long term (current) drug therapy: Secondary | ICD-10-CM | POA: Diagnosis not present

## 2017-03-25 DIAGNOSIS — I10 Essential (primary) hypertension: Secondary | ICD-10-CM | POA: Insufficient documentation

## 2017-03-25 DIAGNOSIS — Z7901 Long term (current) use of anticoagulants: Secondary | ICD-10-CM | POA: Insufficient documentation

## 2017-03-25 DIAGNOSIS — R079 Chest pain, unspecified: Secondary | ICD-10-CM | POA: Diagnosis not present

## 2017-03-25 DIAGNOSIS — F329 Major depressive disorder, single episode, unspecified: Secondary | ICD-10-CM | POA: Insufficient documentation

## 2017-03-25 DIAGNOSIS — R0602 Shortness of breath: Secondary | ICD-10-CM | POA: Diagnosis not present

## 2017-03-25 NOTE — ED Triage Notes (Signed)
Pt c/o central/left chest pain with dizziness and SOB that started when he woke up tonight around 2130.  Pt states the pain is worse with movement, is compliant with all medications.  Pt denies nausea, denies diaphoresis, denies pain radiating to arm, shoulder, back or jaw.

## 2017-03-26 DIAGNOSIS — I482 Chronic atrial fibrillation: Secondary | ICD-10-CM | POA: Diagnosis not present

## 2017-03-26 LAB — BASIC METABOLIC PANEL
Anion gap: 7 (ref 5–15)
BUN: 15 mg/dL (ref 6–20)
CO2: 26 mmol/L (ref 22–32)
Calcium: 8.6 mg/dL — ABNORMAL LOW (ref 8.9–10.3)
Chloride: 102 mmol/L (ref 101–111)
Creatinine, Ser: 0.99 mg/dL (ref 0.61–1.24)
GFR calc Af Amer: >60 mL/min (ref >60)
GFR calc non Af Amer: >60 mL/min (ref >60)
Glucose, Bld: 81 mg/dL (ref 65–99)
Potassium: 3.8 mmol/L (ref 3.5–5.1)
Sodium: 135 mmol/L (ref 135–145)

## 2017-03-26 LAB — CBC
HCT: 44.6 % (ref 39.0–52.0)
Hemoglobin: 15 g/dL (ref 13.0–17.0)
MCH: 30.3 pg (ref 26.0–34.0)
MCHC: 33.6 g/dL (ref 30.0–36.0)
MCV: 90.1 fL (ref 78.0–100.0)
Platelets: 203 10*3/uL (ref 150–400)
RBC: 4.95 MIL/uL (ref 4.22–5.81)
RDW: 14.7 % (ref 11.5–15.5)
WBC: 9.4 10*3/uL (ref 4.0–10.5)

## 2017-03-26 LAB — TROPONIN I: Troponin I: <0.03 ng/mL (ref <0.03)

## 2017-03-26 MED ORDER — DEXAMETHASONE 4 MG PO TABS
10.0000 mg | ORAL_TABLET | Freq: Once | ORAL | Status: AC
  Administered 2017-03-26: 10 mg via ORAL
  Filled 2017-03-26: qty 1

## 2017-03-26 MED ORDER — OXYCODONE-ACETAMINOPHEN 5-325 MG PO TABS: 1.0000 | ORAL_TABLET | ORAL | 0 refills | Status: DC | PRN

## 2017-03-26 NOTE — Discharge Instructions (Addendum)
Apply ice several times a day. Take acetaminophen for less severe pain. Return if symptoms are getting worse.

## 2017-03-26 NOTE — ED Provider Notes (Signed)
[facility] DEPT [facility] Provider Note   CSN: [id number] Arrival date & time: 03/25/17  2227     History   Chief Complaint Chief Complaint  Patient presents with  . Chest Pain    HPI [name] is a 56 y.o. male.  The history is provided by the patient.  He woke up at 6 PM and noted sharp anterior chest pain without radiation. He rated pain at 8/10. It is worse with a deep breath, especially when he exhales. There is mild associated dyspnea but no nausea or vomiting or diaphoresis. He denies fever, chills, sweats. He denies cough. He has not done anything to treat his pain. Of note, he has a history of atrial fibrillation, and is anticoagulated on apixaban. He states he has been compliant with all of his medications. He is nonsmoker, but does have history of hypertension and diabetes.  Past Medical History:  Diagnosis Date  . A-fib ([place])   . Arrhythmia   . Depression   . Depression   . Hypertension   . Low back pain   . Morbid obesity, BMI unknown ([place])   . Osteoarthritis    oa in bilateral knees  . Seizures ([place])   . Thyroid disease   . Varicose veins     Patient Active Problem List   Diagnosis Date Noted  . Itchy skin 01/22/2017  . Varicose veins of bilateral lower extremities with other complications 02/58/5277  . Varicose veins 12/23/2015  . Erectile dysfunction 04/26/2015  . Thyroid disease   . Seizures ([place])   . Depression   . Arrhythmia   . Obesity, Class III, BMI 40-49.9 (morbid obesity) ([place])   . Low testosterone 02/10/2007  . T2DM (type 2 diabetes mellitus) ([place]) 02/10/2007    Past Surgical History:  Procedure Laterality Date  . ACHILLES TENDON REPAIR  approx. 2004   right foot  . CATARACT EXTRACTION     bilateral  . stab phlebectomy  Right 11/09/2016   stab phlebectomy R leg by Tinnie Gens MD  . [facility] Medications    Prior to Admission medications   Medication Sig Start Date End Date Taking? Authorizing  Provider  allopurinol (ZYLOPRIM) 300 MG tablet TAKE 1 TABLET BY MOUTH EVERY DAY 03/15/17   Mayo, Pete Pelt, MD  benztropine (COGENTIN) 1 MG tablet Take 1 tablet (1 mg total) by mouth daily. 03/01/17   Arfeen, Arlyce Harman, MD  DEPAKOTE ER 250 MG 24 hr tablet Take 2 tab in am and 2 at bed time 03/01/17   Arfeen, Arlyce Harman, MD  desonide (DESOWEN) 0.05 % cream Apply 1 application topically daily as needed (dry skin).    [provider]  ELIQUIS 5 MG TABS tablet TAKE 1 TABLET BY MOUTH TWICE A DAY 04/06/16   Vivi Barrack, MD  glipiZIDE (GLUCOTROL XL) 5 MG 24 hr tablet Take 1 tablet (5 mg total) by mouth daily with breakfast. 01/07/17   Mercy Riding, MD  ketoconazole (NIZORAL) 2 % shampoo Apply 1 application topically 2 (two) times a week. 12/23/15   Vivi Barrack, MD  levothyroxine (SYNTHROID, LEVOTHROID) 100 MCG tablet Take 1 tablet (100 mcg total) by mouth daily. 12/28/16   Vivi Barrack, MD  perphenazine (TRILAFON) 8 MG tablet Take 3 tablets (24 mg total) by mouth at bedtime. 03/01/17   Arfeen, Arlyce Harman, MD  phenelzine (NARDIL) 15 MG tablet Take 3 tablets (45 mg total) by mouth 2 (  two) times daily. 03/02/17   Arfeen, Arlyce Harman, MD  silver sulfADIAZINE (SILVADENE) 1 % cream Apply 1 application topically daily as needed (for rash).    [provider]    Family History Family History  Problem Relation Age of Onset  . Diabetes Father   . Heart disease Father   . Diabetes Brother   . OCD Mother     Social History Social History  Substance Use Topics  . Smoking status: Never Smoker  . Smokeless tobacco: Never Used  . Alcohol use No     Allergies   Lamictal [lamotrigine] and Metformin and related   Review of Systems Review of Systems  All other systems reviewed and are negative.    Physical Exam Updated Vital Signs BP 114/84   Pulse 80   Temp 97.6 F (36.4 C) (Oral)   Resp (!) 21   Ht 5\' 10"  (1.778 m)   Wt 122.5 kg (270 lb)   SpO2 92%   BMI 38.74 kg/m   Physical Exam    Nursing note and vitals reviewed.  56 year old male, resting comfortably and in no acute distress. Vital signs are normal. Oxygen saturation is 97%, which is normal. Head is normocephalic and atraumatic. PERRLA, EOMI. Oropharynx is clear. Neck is nontender and supple without adenopathy or JVD. Back is nontender and there is no CVA tenderness. Lungs are clear without rales, wheezes, or rhonchi. Chest has point tenderness in the left parasternal area which does reproduce his pain. Heart has regular rate and rhythm without murmur. Abdomen is soft, flat, nontender without masses or hepatosplenomegaly and peristalsis is normoactive. Extremities have trace edema, full range of motion is present. Skin is warm and dry without rash. Neurologic: Mental status is normal, cranial nerves are intact, there are no motor or sensory deficits.  ED Treatments / Results  Labs (all labs ordered are listed, but only abnormal results are displayed) Labs Reviewed  BASIC METABOLIC PANEL - Abnormal; Notable for the following:       Result Value   Calcium 8.6 (*)    All other components within normal limits  CBC  TROPONIN I    EKG  EKG Interpretation  Date/Time:  Thursday March 25 2017 22:32:18 EDT Ventricular Rate:  101 PR Interval:    QRS Duration: 74 QT Interval:  342 QTC Calculation: 443 R Axis:   24 Text Interpretation:  Atrial fibrillation with rapid ventricular response Abnormal ECG When compared with ECG of 01/07/2017, No significant change was found Confirmed by Delora Fuel ([id number]) on 03/26/2017 2:31:07 AM       Radiology Dg Chest 2 View  Result Date: 03/25/2017 CLINICAL DATA:  Shortness of breath and chest pain. History of atrial fibrillation on blood thinners. Hypertension. EXAM: CHEST  2 VIEW COMPARISON:  01/07/2017 FINDINGS: Normal heart size and pulmonary vascularity. Linear scarring in the left mid lung. Lungs are otherwise clear and expanded. No blunting of costophrenic angles.  No pneumothorax. Mildly tortuous aorta. Degenerative changes in the spine. IMPRESSION: Scarring in the left mid lung. No evidence of active pulmonary disease. Electronically Signed   By: Lucienne Capers M.D.   On: 03/25/2017 23:05    Procedures Procedures (including critical care time)  Medications Ordered in ED Medications  dexamethasone (DECADRON) tablet 10 mg (10 mg Oral Given 03/26/17 0254)     Initial Impression / Assessment and Plan / ED Course  I have reviewed the triage vital signs and the nursing notes.  Pertinent labs & imaging  results that were available during my care of the patient were reviewed by me and considered in my medical decision making (see chart for details).  Chest pain which by history and exam seems to be chest wall pain. No red plaques to suggest more serious pathology. ECG is unchanged from baseline and chest x-ray is unremarkable. Troponin is normal and glucose is normal. He is given a dose of dexamethasone in the ED. Old records are reviewed, and he does have several other ED visits for chest pain. No history of cardiac problems other than atrial fibrillation. He is reassured that pain does appear to be benign, and he is urged to follow-up with his PCP in 4-5 days. He is given a prescription for a small number of oxycodone acetaminophen tablets to take as needed for pain control.  CHA2DS2/VAS Stroke Risk Points      2 >= 2 Points: High Risk  1 - 1.99 Points: Medium Risk  0 Points: Low Risk    The patient's score has not changed in the past year.:  No Change         Details    Note: External data might be a factor in metrics not marked with    Points Metrics   This score determines the patient's risk of having a stroke if the  patient has atrial fibrillation.       0 Has Congestive Heart Failure:  No   0 Has Vascular Disease:  No   1 Has Hypertension:  Yes    0 Age:  13   1 Has Diabetes:  Yes   0 Had Stroke:  No Had TIA:  No Had thromboembolism:  No    0 Male:  No          Final Clinical Impressions(s) / ED Diagnoses   Final diagnoses:  Chest wall pain  Chronic atrial fibrillation ([place])  Chronic anticoagulation    New Prescriptions Discharge Medication List as of 03/26/2017  3:01 AM    START taking these medications   Details  oxyCODONE-acetaminophen (PERCOCET) 5-325 MG tablet Take 1 tablet by mouth every 4 (four) hours as needed for moderate pain., Starting Fri 03/26/2017, Print         Delora Fuel, MD 33/82/50 [phone]

## 2017-03-26 NOTE — ED Notes (Signed)
C/o sharp chest pain, dizziness, and sob x 6 hours,  Pain increased w movement  Denies n/v,  No distress noted

## 2017-04-09 ENCOUNTER — Other Ambulatory Visit: Payer: Self-pay | Admitting: Student

## 2017-04-12 ENCOUNTER — Other Ambulatory Visit: Payer: Self-pay | Admitting: Internal Medicine

## 2017-04-12 MED ORDER — APIXABAN 5 MG PO TABS: 5.0000 mg | ORAL_TABLET | Freq: Two times a day (BID) | ORAL | 11 refills | Status: DC

## 2017-04-13 ENCOUNTER — Ambulatory Visit: Payer: Self-pay | Admitting: Internal Medicine

## 2017-04-13 ENCOUNTER — Other Ambulatory Visit (HOSPITAL_COMMUNITY): Payer: Self-pay | Admitting: Psychiatry

## 2017-04-13 DIAGNOSIS — F330 Major depressive disorder, recurrent, mild: Secondary | ICD-10-CM

## 2017-04-15 ENCOUNTER — Ambulatory Visit (INDEPENDENT_AMBULATORY_CARE_PROVIDER_SITE_OTHER): Payer: Medicare Other | Admitting: Internal Medicine

## 2017-04-15 ENCOUNTER — Encounter: Payer: Self-pay | Admitting: Internal Medicine

## 2017-04-15 VITALS — BP 110/60 | HR 80 | Temp 98.7°F | Ht 70.0 in | Wt 270.0 lb

## 2017-04-15 DIAGNOSIS — L245 Irritant contact dermatitis due to other chemical products: Secondary | ICD-10-CM

## 2017-04-15 DIAGNOSIS — I4819 Other persistent atrial fibrillation: Secondary | ICD-10-CM

## 2017-04-15 DIAGNOSIS — E118 Type 2 diabetes mellitus with unspecified complications: Secondary | ICD-10-CM

## 2017-04-15 DIAGNOSIS — I481 Persistent atrial fibrillation: Secondary | ICD-10-CM | POA: Diagnosis not present

## 2017-04-15 DIAGNOSIS — M79672 Pain in left foot: Secondary | ICD-10-CM | POA: Diagnosis not present

## 2017-04-15 LAB — POCT GLYCOSYLATED HEMOGLOBIN (HGB A1C): Hemoglobin A1C: 6.3

## 2017-04-15 MED ORDER — TRIAMCINOLONE ACETONIDE 0.5 % EX OINT: 1.0000 "application " | TOPICAL_OINTMENT | Freq: Two times a day (BID) | CUTANEOUS | 3 refills | Status: DC

## 2017-04-15 NOTE — Patient Instructions (Signed)
It was so nice to see you!  I have sent in a referral to [facility]. You should hear from our office to schedule this appointment.  I have also prescribed Triamcinolone ointment for your underarms. You can use this twice a day until the rash goes away.   Please keep an eye on how often you feel like your blood sugars are low. We may need to discuss switching you to a different diabetes medication.  We will see you back in 3 months!  -Dr. Brett Albino

## 2017-04-16 DIAGNOSIS — M79672 Pain in left foot: Secondary | ICD-10-CM | POA: Insufficient documentation

## 2017-04-16 DIAGNOSIS — L259 Unspecified contact dermatitis, unspecified cause: Secondary | ICD-10-CM

## 2017-04-16 HISTORY — DX: Unspecified contact dermatitis, unspecified cause: L25.9

## 2017-04-16 NOTE — Assessment & Plan Note (Signed)
No deformities noted on exam. Could possibly be due to arthritis of the foot/ankle. Stress fracture also on the differential, although suspect patient would have pain more frequently if this were the case. No acute injury noted. No warmth/erythema/severe pain to suggest septic joint or gout. - Discussed different options including x-ray and NSAIDs. Patient prefers referral to [facility], who he has seen in the past. Referral placed.

## 2017-04-16 NOTE — Assessment & Plan Note (Signed)
Contact dermatitis of the bilateral axilla after changing brands of deodorant. No signs of bacterial superinfection. - Recommended switching deodorant brands. Should try to find something for sensitive skin. - Triamcinolone ointment bid prescribed for symptom relief.

## 2017-04-16 NOTE — Progress Notes (Signed)
[facility] Phone: [phone]  Subjective:  [name] is a 56 year old male presenting to clinic for left foot pain, rash under his arms, and follow-up of his diabetes.  Left Foot Pain: Has been going on for 3-4 months. Pain is located on the front of his foot/ankle. Sometimes feels like "his foot will give out". Also notes some pain of his achilles tendon. Pain is worse with standing on the foot for a long period of time. Denies any trauma or injury to the foot. States he thinks he has sprained this ankle a few times in the past. Has not noticed any redness or swelling. No numbness or weakness in the foot. Has not tried any medications for this. He wants to follow-up with his Orthopedic doctor, but was told that he would need a new referral from his PCP.   Rash: Located under both arms. Has been going on for 3-4 months. Started after he changed brands of deodorant. Has used the same deodorant for many years, but they recent discontinued it. The rash is "itchy" and "burning". He has tried OTC hydrocortisone cream and this has helped with the itching. No drainage, no spreading redness.  T2DM: Taking Glipizide 5mg  daily at home. Has been on this for the last few months. No side effects to Glipizide. He does note that he feels a little "woozy" if he goes too long without eating. He has started eating small snacks in between meals, which has helped prevent this from happening. Does not check his blood sugars at home.  ROS: See HPI for pertinent positives and negatives  Past Medical History- well-controlled T2DM, persistent Afib on Eliquis, hypothyroidism, seizures, depression, obesity  Family history reviewed for today's visit. No changes.  Social history- patient is a never smoker  Objective: BP 110/60   Pulse 80   Temp 98.7 F (37.1 C) (Oral)   Ht 5\' 10"  (1.778 m)   Wt 270 lb (122.5 kg)   SpO2 93%   BMI 38.74 kg/m  Gen: NAD, alert, cooperative with exam HEENT:  NCAT, EOMI, MMM CV: Irregularly irregular rhythm, normal rate, no murmur, 2+ DP and PT pulses in the left foot. Resp: CTABL, no wheezes, normal work of breathing Left ankle: No edema or erythema, no tenderness to palpation of the ankle or foot, no tenderness of the achilles tendon, no nodules appreciated, normal ROM, normal anterior drawer test, normal eversion stress test, normal inversion stress test. Neuro: Alert and oriented, no gross deficits Skin: Well-demarcated, erythematous, peeling rash present in the bilateral axilla. No blisters, no drainage, no oozing.  Assessment/Plan: Left Foot Pain: No deformities noted on exam. Could possibly be due to arthritis of the foot/ankle. Stress fracture also on the differential, although suspect patient would have pain more frequently if this were the case. No acute injury noted. No warmth/erythema/severe pain to suggest septic joint or gout. - Discussed different options including x-ray and NSAIDs. Patient prefers referral to [facility], who he has seen in the past. Referral placed.  Contact Dermatitis: Contact dermatitis of the bilateral axilla after changing brands of deodorant. No signs of bacterial superinfection. - Recommended switching deodorant brands. Should try to find something for sensitive skin. - Triamcinolone ointment bid prescribed for symptom relief.  T2DM: Well-controlled on Glipizide. Thinks he is having some low blood sugars when he goes too long without eating (usually only eating two large meals a day). Has gotten much better after he has added snacks in between meals. A1c  was 6.3% today, goal <7%. - Encouraged patient to eat every 3-4 hours, even if it is just a small snack - Continue Glipizide 5mg  daily for now - Advised patient to keep track of how often he is noticing low blood sugars. May need to take him off Glipizide (and see how he does with diet and exercise) vs switching to another oral medication.   Hyman Bible,  MD PGY-3

## 2017-04-16 NOTE — Assessment & Plan Note (Signed)
Well-controlled on Glipizide. Thinks he is having some low blood sugars when he goes too long without eating (usually only eating two large meals a day). Has gotten much better after he has added snacks in between meals. A1c was 6.3% today, goal <7%. - Encouraged patient to eat every 3-4 hours, even if it is just a small snack - Continue Glipizide 5mg  daily for now - Advised patient to keep track of how often he is noticing low blood sugars. May need to take him off Glipizide (and see how he does with diet and exercise) vs switching to another oral medication.

## 2017-04-20 DIAGNOSIS — M25572 Pain in left ankle and joints of left foot: Secondary | ICD-10-CM | POA: Diagnosis not present

## 2017-04-20 DIAGNOSIS — M19072 Primary osteoarthritis, left ankle and foot: Secondary | ICD-10-CM | POA: Diagnosis not present

## 2017-04-20 DIAGNOSIS — M79672 Pain in left foot: Secondary | ICD-10-CM | POA: Diagnosis not present

## 2017-04-30 ENCOUNTER — Other Ambulatory Visit (HOSPITAL_COMMUNITY): Payer: Self-pay | Admitting: Psychiatry

## 2017-04-30 DIAGNOSIS — F330 Major depressive disorder, recurrent, mild: Secondary | ICD-10-CM

## 2017-05-04 ENCOUNTER — Other Ambulatory Visit (HOSPITAL_COMMUNITY): Payer: Self-pay

## 2017-05-04 DIAGNOSIS — F330 Major depressive disorder, recurrent, mild: Secondary | ICD-10-CM

## 2017-05-17 ENCOUNTER — Encounter: Payer: Self-pay | Admitting: Internal Medicine

## 2017-05-17 ENCOUNTER — Encounter (HOSPITAL_COMMUNITY): Payer: Self-pay | Admitting: Psychiatry

## 2017-05-17 ENCOUNTER — Ambulatory Visit (INDEPENDENT_AMBULATORY_CARE_PROVIDER_SITE_OTHER): Payer: Medicare Other | Admitting: Psychiatry

## 2017-05-17 DIAGNOSIS — F330 Major depressive disorder, recurrent, mild: Secondary | ICD-10-CM

## 2017-05-17 DIAGNOSIS — R251 Tremor, unspecified: Secondary | ICD-10-CM

## 2017-05-17 DIAGNOSIS — F429 Obsessive-compulsive disorder, unspecified: Secondary | ICD-10-CM

## 2017-05-17 LAB — HM DIABETES EYE EXAM

## 2017-05-17 MED ORDER — PERPHENAZINE 8 MG PO TABS: 24.0000 mg | ORAL_TABLET | Freq: Every day | ORAL | 2 refills | Status: DC

## 2017-05-17 MED ORDER — PHENELZINE SULFATE 15 MG PO TABS: 45.0000 mg | ORAL_TABLET | Freq: Two times a day (BID) | ORAL | 0 refills | Status: DC

## 2017-05-17 MED ORDER — BENZTROPINE MESYLATE 1 MG PO TABS: 1.0000 mg | ORAL_TABLET | Freq: Every day | ORAL | 2 refills | Status: DC

## 2017-05-17 MED ORDER — DEPAKOTE ER 250 MG PO TB24: ORAL_TABLET | ORAL | 2 refills | Status: DC

## 2017-05-17 NOTE — Progress Notes (Signed)
[name]/PA/NP OP Progress Note  05/17/2017 10:28 AM [name]  MRN:  [id number]  Chief Complaint: I am feeling better.  I have no more dizziness.  HPI: [name] came for his follow-up appointment.  He is compliant with his psychotropic medication.  Recently he was in the emergency room for the chest pain and he had cardiac workup which was negative.  He was given Percocet for chest pain but he has not taken it.  Overall he described his mood is good.  He is happy that he has no longer dizziness.  He is sleeping good.  He was disappointed because his male friend moved to [place] in some time he feels very lonely.  He is no longer interested in relationship but he does socialize with the friends.  He is sleeping at least 6-8 hours.  He denies any irritability, mania, anger or any psychosis.  He has mild tremors in his hands but otherwise he has no major concerns.  He wants to continue Nardil, Trilafon, Depakote and Cogentin.  His energy level is good.  He is excited about Thanksgiving because he usually spent Thanksgiving and Christmas with his mother.  Patient is not interested in counseling.  He denies drinking alcohol or using any illegal substances.  Visit Diagnosis:    ICD-10-CM   1. Major depressive disorder, recurrent episode, mild (HCC) F33.0 phenelzine (NARDIL) 15 MG tablet    perphenazine (TRILAFON) 8 MG tablet    DEPAKOTE ER 250 MG 24 hr tablet    benztropine (COGENTIN) 1 MG tablet    Past Psychiatric History: Reviewed. Patient has multiple hospitalizations from [phone] due to severe depression and having suicidal thoughts with negative thinking but denies any suicidal attempt. He was diagnosed with major depressive disorder, schizoaffective disorder and OCD. He admitted history of paranoia and intrusive thoughts in the past. He had tried numerous medications mostly TCAs. Hewas seeingoutpatient services with a psychiatrist in [place] Dr. Richardson Landry Curleybut decided to establish  care in this office after his physician retired.His last psychiatrist was Dr. De Nurse in [place].We tried Lamictal but he developed a rash.  Past Medical History:  Past Medical History:  Diagnosis Date  . A-fib ([place])   . Arrhythmia   . Depression   . Depression   . Hypertension   . Low back pain   . Morbid obesity, BMI unknown ([place])   . Osteoarthritis    oa in bilateral knees  . Seizures ([place])   . Thyroid disease   . Varicose veins     Past Surgical History:  Procedure Laterality Date  . ACHILLES TENDON REPAIR  approx. 2004   right foot  . CATARACT EXTRACTION     bilateral  . stab phlebectomy  Right 11/09/2016   stab phlebectomy R leg by Tinnie Gens Trujillo  . TOOTH EXTRACTION      Family Psychiatric History: Reviewed.  Family History:  Family History  Problem Relation Age of Onset  . Diabetes Father   . Heart disease Father   . Diabetes Brother   . OCD Mother     Social History:  Social History   Socioeconomic History  . Marital status: Single    Spouse name: None  . Number of children: None  . Years of education: None  . Highest education level: None  Social Needs  . Financial resource strain: None  . Food insecurity - worry: None  . Food insecurity - inability: None  . Transportation needs - medical: None  . Transportation  needs - non-medical: None  Occupational History  . Occupation: disabled  Tobacco Use  . Smoking status: Never Smoker  . Smokeless tobacco: Never Used  Substance and Sexual Activity  . Alcohol use: No    Alcohol/week: 0.0 oz  . Drug use: No  . Sexual activity: No  Other Topics Concern  . None  Social History Narrative  . None    Allergies:  Allergies  Allergen Reactions  . Lamictal [Lamotrigine] Rash  . Metformin And Related Rash    Metabolic Disorder Labs: Lab Results  Component Value Date   HGBA1C 6.3 04/15/2017   MPG 146 (H) 04/24/2015   MPG 140 02/05/2007   No results found for: PROLACTIN Lab Results   Component Value Date   CHOL 206 (H) 07/16/2016   TRIG 299 (H) 07/16/2016   HDL 24 (L) 07/16/2016   CHOLHDL 8.6 (H) 07/16/2016   VLDL 60 (H) 07/16/2016   LDLCALC 122 (H) 07/16/2016   LDLCALC 112 04/30/2015   Lab Results  Component Value Date   TSH 1.23 12/23/2015   TSH 2.076 04/30/2015    Therapeutic Level Labs: No results found for: LITHIUM Lab Results  Component Value Date   VALPROATE 46 (L) 12/07/2016   VALPROATE 61.3 09/09/2015   No components found for:  CBMZ  Current Medications: Current Outpatient Medications  Medication Sig Dispense Refill  . allopurinol (ZYLOPRIM) 300 MG tablet TAKE 1 TABLET BY MOUTH EVERY DAY 90 tablet 0  . apixaban (ELIQUIS) 5 MG TABS tablet Take 1 tablet (5 mg total) by mouth 2 (two) times daily. 60 tablet 11  . benztropine (COGENTIN) 1 MG tablet Take 1 tablet (1 mg total) by mouth daily. 30 tablet 2  . DEPAKOTE ER 250 MG 24 hr tablet Take 2 tab in am and 2 at bed time 120 tablet 2  . desonide (DESOWEN) 0.05 % cream Apply 1 application topically daily as needed (dry skin).    Marland Kitchen glipiZIDE (GLUCOTROL XL) 5 MG 24 hr tablet TAKE 1 TABLET (5 MG TOTAL) BY MOUTH DAILY WITH BREAKFAST. 30 tablet 2  . ketoconazole (NIZORAL) 2 % shampoo Apply 1 application topically 2 (two) times a week. 120 mL 0  . levothyroxine (SYNTHROID, LEVOTHROID) 100 MCG tablet Take 1 tablet (100 mcg total) by mouth daily. 90 tablet 3  . oxyCODONE-acetaminophen (PERCOCET) 5-325 MG tablet Take 1 tablet by mouth every 4 (four) hours as needed for moderate pain. 10 tablet 0  . perphenazine (TRILAFON) 8 MG tablet Take 3 tablets (24 mg total) by mouth at bedtime. 90 tablet 2  . phenelzine (NARDIL) 15 MG tablet Take 3 tablets (45 mg total) by mouth 2 (two) times daily. 540 tablet 0  . silver sulfADIAZINE (SILVADENE) 1 % cream Apply 1 application topically daily as needed (for rash).    . triamcinolone ointment (KENALOG) 0.5 % Apply 1 application topically 2 (two) times daily. For moderate to  severe eczema.  Do not use for more than 1 week at a time. 60 g 3   No current facility-administered medications for this visit.      Musculoskeletal: Strength & Muscle Tone: within normal limits Gait & Station: normal Patient leans: N/A  Psychiatric Specialty Exam: Review of Systems  Neurological: Positive for tremors.    Blood pressure 130/74, pulse 82, height 5' 10.5" (1.791 m), weight 273 lb (123.8 kg).Body mass index is 38.62 kg/m.  General Appearance: Casual  Eye Contact:  Good  Speech:  Clear and Coherent  Volume:  Normal  Mood:  Euthymic  Affect:  Appropriate  Thought Process:  Goal Directed  Orientation:  Full (Time, Place, and Person)  Thought Content: Logical   Suicidal Thoughts:  No  Homicidal Thoughts:  No  Memory:  Immediate;   Good Recent;   Good Remote;   Good  Judgement:  Good  Insight:  Good  Psychomotor Activity:  minor tremors in hand  Concentration:  Concentration: Fair and Attention Span: Fair  Recall:  Good  Fund of Knowledge: Good  Language: Good  Akathisia:  No  Handed:  Right  AIMS (if indicated): not done  Assets:  Communication Skills Desire for Improvement Housing Transportation  ADL's:  Intact  Cognition: WNL  Sleep:  Good   Screenings: PHQ2-9     Office Visit from 04/15/2017 in [facility] Office Visit from 12/23/2015 in [facility] Office Visit from 06/03/2015 in [facility] Counselor from 05/21/2015 in [facility] Office Visit from 04/26/2015 in [facility]  PHQ-2 Total Score  0  0  3  1  6   PHQ-9 Total Score  No data  No data  5  No data  No data       Assessment and Plan: Major depressive disorder, recurrent.  Patient is a stable on his current psychiatric medication.  His last Depakote level was 46 and hemoglobin A1c is improved.  He wants to continue his current psychiatric medication.  I will  continue Nardil 45 mg twice a day, Trilafon 24 mg bedtime, Cogentin 1 mg daily and Depakote 500 mg twice a day.  Discussed medication side effects and benefits.  Patient is aware about his dietary restriction related to monoamide oxidase inhibitors.  He is not interested in counseling.  Recommended to call us back if he has any question, concerns or if he feels worsening of the symptoms.  Follow-up in 3 months.   Iesha Summerhill T., Trujillo 05/17/2017, 10:28 AM

## 2017-06-08 ENCOUNTER — Other Ambulatory Visit: Payer: Self-pay | Admitting: Internal Medicine

## 2017-06-11 ENCOUNTER — Telehealth: Payer: Self-pay | Admitting: Internal Medicine

## 2017-06-11 ENCOUNTER — Other Ambulatory Visit: Payer: Self-pay | Admitting: Internal Medicine

## 2017-06-11 MED ORDER — SILVER SULFADIAZINE 1 % EX CREA: 1.0000 "application " | TOPICAL_CREAM | Freq: Every day | CUTANEOUS | 2 refills | Status: DC | PRN

## 2017-06-11 NOTE — Telephone Encounter (Signed)
Pt called and said he didn't want the refill on the ointment for his rash. He wanted a refill on the cream, silver sulfadiazine instead. Pt wants it sent to [facility] on [address] at [address]. Please advise

## 2017-06-11 NOTE — Telephone Encounter (Signed)
Refill sent to pharmacy.   

## 2017-07-10 ENCOUNTER — Other Ambulatory Visit: Payer: Self-pay | Admitting: Internal Medicine

## 2017-08-17 ENCOUNTER — Ambulatory Visit (INDEPENDENT_AMBULATORY_CARE_PROVIDER_SITE_OTHER): Payer: Medicare Other | Admitting: Psychiatry

## 2017-08-17 ENCOUNTER — Other Ambulatory Visit (HOSPITAL_COMMUNITY): Payer: Self-pay

## 2017-08-17 ENCOUNTER — Encounter (HOSPITAL_COMMUNITY): Payer: Self-pay | Admitting: Psychiatry

## 2017-08-17 DIAGNOSIS — Z818 Family history of other mental and behavioral disorders: Secondary | ICD-10-CM

## 2017-08-17 DIAGNOSIS — Z79899 Other long term (current) drug therapy: Secondary | ICD-10-CM

## 2017-08-17 DIAGNOSIS — F320 Major depressive disorder, single episode, mild: Secondary | ICD-10-CM | POA: Diagnosis not present

## 2017-08-17 DIAGNOSIS — F251 Schizoaffective disorder, depressive type: Secondary | ICD-10-CM

## 2017-08-17 DIAGNOSIS — F330 Major depressive disorder, recurrent, mild: Secondary | ICD-10-CM

## 2017-08-17 MED ORDER — DEPAKOTE ER 250 MG PO TB24: ORAL_TABLET | ORAL | 2 refills | Status: DC

## 2017-08-17 MED ORDER — BENZTROPINE MESYLATE 1 MG PO TABS
1.0000 mg | ORAL_TABLET | Freq: Every day | ORAL | 0 refills | Status: DC
Start: 2017-08-17 — End: 2017-11-16

## 2017-08-17 MED ORDER — PHENELZINE SULFATE 15 MG PO TABS: 45.0000 mg | ORAL_TABLET | Freq: Two times a day (BID) | ORAL | 0 refills | Status: DC

## 2017-08-17 MED ORDER — PERPHENAZINE 8 MG PO TABS: 24.0000 mg | ORAL_TABLET | Freq: Every day | ORAL | 2 refills | Status: DC

## 2017-08-17 NOTE — Progress Notes (Signed)
[facility] MD/PA/NP OP Progress Note  08/17/2017 10:04 AM [name]  MRN:  [id number]  Chief Complaint: I am doing good.  I am taking my medication.  HPI: [name] came for his follow-up appointment.  He is compliant with his psychiatric medication.  He denies any agitation, anger, mania or any psychosis.  He has no more dizziness and chest pain.  He is spending most of the time watching TV and visiting his mother's house.  He enjoys watching ice hockey and recently he even visited [place] to watch the game.  He had a good time.  He is no longer in relationship.  He is disappointed that it ended and his male friend moved to [place].  He tried to contact her a few times but he did not get any response.  Patient does not want to change his medication.  He denies any irritability or any crying spells.  He denies any feeling of hopelessness or worthlessness.  He denies any suicidal thoughts or homicidal thought.  He had a good Christmas and holidays.  Patient is not interested in counseling.  He denies drinking alcohol or using any illegal substances.  He admitted not able to see his primary care physician in a while and like to get blood work including hemoglobin A1c.  His appetite is okay.  His vital signs are stable.  Visit Diagnosis:    ICD-10-CM   1. Major depressive disorder, recurrent episode, mild (HCC) F33.0 phenelzine (NARDIL) 15 MG tablet    perphenazine (TRILAFON) 8 MG tablet    DEPAKOTE ER 250 MG 24 hr tablet    benztropine (COGENTIN) 1 MG tablet    Past Psychiatric History: Reviewed. Patient has multiple hospitalizations from [phone] due to severe depression, suicidal thoughts with negative thinking but denies any suicidal attempt. He was diagnosed with major depressive disorder, schizoaffective disorder and OCD. He admitted history of paranoia and intrusive thoughts in the past. He had tried numerous medications mostly TCAs. Hewas seeingoutpatient psychiatrist in [place] Dr. Richardson Landry  Curleybut decided to establish care in this office after his physician retired.His last psychiatrist was Dr. De Nurse in [place].We tried Lamictal but he developed a rash.  Past Medical History:  Past Medical History:  Diagnosis Date  . A-fib ([place])   . Arrhythmia   . Depression   . Depression   . Hypertension   . Low back pain   . Morbid obesity, BMI unknown ([place])   . Osteoarthritis    oa in bilateral knees  . Seizures ([place])   . Thyroid disease   . Varicose veins     Past Surgical History:  Procedure Laterality Date  . ACHILLES TENDON REPAIR  approx. 2004   right foot  . CATARACT EXTRACTION     bilateral  . stab phlebectomy  Right 11/09/2016   stab phlebectomy R leg by Tinnie Gens MD  . TOOTH EXTRACTION      Family Psychiatric History: Viewed.  Family History:  Family History  Problem Relation Age of Onset  . Diabetes Father   . Heart disease Father   . Diabetes Brother   . OCD Mother     Social History:  Social History   Socioeconomic History  . Marital status: Single    Spouse name: Not on file  . Number of children: Not on file  . Years of education: Not on file  . Highest education level: Not on file  Social Needs  . Financial resource strain: Not on file  . Food  insecurity - worry: Not on file  . Food insecurity - inability: Not on file  . Transportation needs - medical: Not on file  . Transportation needs - non-medical: Not on file  Occupational History  . Occupation: disabled  Tobacco Use  . Smoking status: Never Smoker  . Smokeless tobacco: Never Used  Substance and Sexual Activity  . Alcohol use: No    Alcohol/week: 0.0 oz  . Drug use: No  . Sexual activity: No  Other Topics Concern  . Not on file  Social History Narrative  . Not on file    Allergies:  Allergies  Allergen Reactions  . Lamictal [Lamotrigine] Rash  . Metformin And Related Rash    Metabolic Disorder Labs: Lab Results  Component Value Date   HGBA1C 6.3  04/15/2017   MPG 146 (H) 04/24/2015   MPG 140 02/05/2007   No results found for: PROLACTIN Lab Results  Component Value Date   CHOL 206 (H) 07/16/2016   TRIG 299 (H) 07/16/2016   HDL 24 (L) 07/16/2016   CHOLHDL 8.6 (H) 07/16/2016   VLDL 60 (H) 07/16/2016   LDLCALC 122 (H) 07/16/2016   LDLCALC 112 04/30/2015   Lab Results  Component Value Date   TSH 1.23 12/23/2015   TSH 2.076 04/30/2015    Therapeutic Level Labs: No results found for: LITHIUM Lab Results  Component Value Date   VALPROATE 46 (L) 12/07/2016   VALPROATE 61.3 09/09/2015   No components found for:  CBMZ  Current Medications: Current Outpatient Medications  Medication Sig Dispense Refill  . allopurinol (ZYLOPRIM) 300 MG tablet TAKE 1 TABLET BY MOUTH EVERY DAY 90 tablet 0  . apixaban (ELIQUIS) 5 MG TABS tablet Take 1 tablet (5 mg total) by mouth 2 (two) times daily. 60 tablet 11  . benztropine (COGENTIN) 1 MG tablet Take 1 tablet (1 mg total) daily by mouth. 30 tablet 2  . DEPAKOTE ER 250 MG 24 hr tablet Take 2 tab in am and 2 at bed time 120 tablet 2  . desonide (DESOWEN) 0.05 % cream Apply 1 application topically daily as needed (dry skin).    Marland Kitchen glipiZIDE (GLUCOTROL XL) 5 MG 24 hr tablet TAKE 1 TABLET BY MOUTH EVERY DAY WITH BREAKFAST 30 tablet 2  . ketoconazole (NIZORAL) 2 % shampoo Apply 1 application topically 2 (two) times a week. 120 mL 0  . levothyroxine (SYNTHROID, LEVOTHROID) 100 MCG tablet Take 1 tablet (100 mcg total) by mouth daily. 90 tablet 3  . perphenazine (TRILAFON) 8 MG tablet Take 3 tablets (24 mg total) at bedtime by mouth. 90 tablet 2  . phenelzine (NARDIL) 15 MG tablet Take 3 tablets (45 mg total) 2 (two) times daily by mouth. 540 tablet 0  . silver sulfADIAZINE (SILVADENE) 1 % cream Apply 1 application topically daily as needed (for rash). 50 g 2  . triamcinolone ointment (KENALOG) 0.5 % Apply 1 application topically 2 (two) times daily. For moderate to severe eczema.  Do not use for more  than 1 week at a time. 60 g 3   No current facility-administered medications for this visit.      Musculoskeletal: Strength & Muscle Tone: within normal limits Gait & Station: normal Patient leans: N/A  Psychiatric Specialty Exam: ROS  Blood pressure 128/86, pulse 80, height 5' 10.5" (1.791 m), weight 274 lb 12.8 oz (124.6 kg).Body mass index is 38.87 kg/m.  General Appearance: Casual  Eye Contact:  Good  Speech:  Clear and Coherent  Volume:  Normal  Mood:  Euthymic  Affect:  Appropriate  Thought Process:  Goal Directed  Orientation:  Full (Time, Place, and Person)  Thought Content: Logical   Suicidal Thoughts:  No  Homicidal Thoughts:  No  Memory:  Immediate;   Good Recent;   Good Remote;   Good  Judgement:  Good  Insight:  Good  Psychomotor Activity:  Tremor  Concentration:  Concentration: Fair and Attention Span: Fair  Recall:  Good  Fund of Knowledge: Good  Language: Good  Akathisia:  No  Handed:  Right  AIMS (if indicated): not done  Assets:  Communication Skills Desire for Improvement Housing Resilience Social Support  ADL's:  Intact  Cognition: WNL  Sleep:  Good   Screenings: PHQ2-9     Office Visit from 04/15/2017 in [facility] Office Visit from 12/23/2015 in [facility] Office Visit from 06/03/2015 in [facility] Counselor from 05/21/2015 in [facility] Office Visit from 04/26/2015 in [facility]  PHQ-2 Total Score  0  0  3  1  6   PHQ-9 Total Score  No data  No data  5  No data  No data       Assessment and Plan: Major depressive disorder, recurrent.  Schizoaffective disorder, depressed type.   Patient is a stable on his current psychiatric medication.  Discussed medication side effects and benefits.  Discussed interaction with mono mono oxidase inhibitors and dietary restrictions.  Patient is well aware about side effects  since he has been taking for a while.  Continue Nardil 45 mg twice a day, Trilafon 24 mg at bedtime, Cogentin 1 mg daily and Depakote 500 mg twice a day.  We will do blood work today including hemoglobin A1c, CBC, CMP and Depakote level.  Recommended to call us back if is any question or any concern.  Patient is not interested in counseling.  Follow-up in 3 months.   Kathlee Nations, MD 08/17/2017, 10:04 AM

## 2017-08-18 ENCOUNTER — Other Ambulatory Visit (HOSPITAL_COMMUNITY): Payer: Self-pay | Admitting: Psychiatry

## 2017-08-18 DIAGNOSIS — F330 Major depressive disorder, recurrent, mild: Secondary | ICD-10-CM

## 2017-08-19 LAB — CBC WITH DIFFERENTIAL/PLATELET
Basophils Absolute: 0 10*3/uL (ref 0.0–0.2)
Basos: 0 %
EOS (ABSOLUTE): 0.5 10*3/uL — ABNORMAL HIGH (ref 0.0–0.4)
Eos: 6 %
Hematocrit: 49.1 % (ref 37.5–51.0)
Hemoglobin: 16.2 g/dL (ref 13.0–17.7)
Immature Grans (Abs): 0 10*3/uL (ref 0.0–0.1)
Immature Granulocytes: 0 %
Lymphocytes Absolute: 2 10*3/uL (ref 0.7–3.1)
Lymphs: 23 %
MCH: 29.6 pg (ref 26.6–33.0)
MCHC: 33 g/dL (ref 31.5–35.7)
MCV: 90 fL (ref 79–97)
Monocytes Absolute: 0.7 10*3/uL (ref 0.1–0.9)
Monocytes: 8 %
Neutrophils Absolute: 5.4 10*3/uL (ref 1.4–7.0)
Neutrophils: 63 %
Platelets: 222 10*3/uL (ref 150–379)
RBC: 5.47 x10E6/uL (ref 4.14–5.80)
RDW: 14.8 % (ref 12.3–15.4)
WBC: 8.6 10*3/uL (ref 3.4–10.8)

## 2017-08-19 LAB — COMPREHENSIVE METABOLIC PANEL
ALT: 7 IU/L (ref 0–44)
AST: 19 IU/L (ref 0–40)
Albumin/Globulin Ratio: 1.8 (ref 1.2–2.2)
Albumin: 4.3 g/dL (ref 3.5–5.5)
Alkaline Phosphatase: 129 IU/L — ABNORMAL HIGH (ref 39–117)
BUN/Creatinine Ratio: 14 (ref 9–20)
BUN: 13 mg/dL (ref 6–24)
Bilirubin Total: 0.5 mg/dL (ref 0.0–1.2)
CO2: 23 mmol/L (ref 20–29)
Calcium: 9.2 mg/dL (ref 8.7–10.2)
Chloride: 101 mmol/L (ref 96–106)
Creatinine, Ser: 0.96 mg/dL (ref 0.76–1.27)
GFR calc Af Amer: 100 mL/min/{1.73_m2} (ref >59)
GFR calc non Af Amer: 87 mL/min/{1.73_m2} (ref >59)
Globulin, Total: 2.4 g/dL (ref 1.5–4.5)
Glucose: 129 mg/dL — ABNORMAL HIGH (ref 65–99)
Potassium: 4.7 mmol/L (ref 3.5–5.2)
Sodium: 142 mmol/L (ref 134–144)
Total Protein: 6.7 g/dL (ref 6.0–8.5)

## 2017-08-19 LAB — HEMOGLOBIN A1C
Est. average glucose Bld gHb Est-mCnc: 123 mg/dL
Hgb A1c MFr Bld: 5.9 % — ABNORMAL HIGH (ref 4.8–5.6)

## 2017-08-19 LAB — VALPROIC ACID LEVEL: Valproic Acid Lvl: 72 ug/mL (ref 50–100)

## 2017-09-05 ENCOUNTER — Other Ambulatory Visit: Payer: Self-pay | Admitting: Internal Medicine

## 2017-09-09 ENCOUNTER — Other Ambulatory Visit: Payer: Self-pay | Admitting: *Deleted

## 2017-09-10 MED ORDER — GLIPIZIDE ER 5 MG PO TB24: ORAL_TABLET | ORAL | 0 refills | Status: DC

## 2017-09-15 ENCOUNTER — Other Ambulatory Visit (HOSPITAL_COMMUNITY): Payer: Self-pay | Admitting: Psychiatry

## 2017-09-15 DIAGNOSIS — F330 Major depressive disorder, recurrent, mild: Secondary | ICD-10-CM

## 2017-09-16 ENCOUNTER — Other Ambulatory Visit (HOSPITAL_COMMUNITY): Payer: Self-pay | Admitting: Psychiatry

## 2017-09-16 DIAGNOSIS — F330 Major depressive disorder, recurrent, mild: Secondary | ICD-10-CM

## 2017-09-17 ENCOUNTER — Other Ambulatory Visit (HOSPITAL_COMMUNITY): Payer: Self-pay | Admitting: Psychiatry

## 2017-09-18 ENCOUNTER — Other Ambulatory Visit (HOSPITAL_COMMUNITY): Payer: Self-pay | Admitting: Psychiatry

## 2017-09-18 DIAGNOSIS — F330 Major depressive disorder, recurrent, mild: Secondary | ICD-10-CM

## 2017-09-20 ENCOUNTER — Other Ambulatory Visit (HOSPITAL_COMMUNITY): Payer: Self-pay | Admitting: Psychiatry

## 2017-10-20 ENCOUNTER — Ambulatory Visit (INDEPENDENT_AMBULATORY_CARE_PROVIDER_SITE_OTHER): Payer: Medicare Other | Admitting: Family Medicine

## 2017-10-20 ENCOUNTER — Encounter: Payer: Self-pay | Admitting: Family Medicine

## 2017-10-20 DIAGNOSIS — R05 Cough: Secondary | ICD-10-CM | POA: Diagnosis not present

## 2017-10-20 DIAGNOSIS — R059 Cough, unspecified: Secondary | ICD-10-CM | POA: Insufficient documentation

## 2017-10-20 MED ORDER — BENZONATATE 100 MG PO CAPS: 100.0000 mg | ORAL_CAPSULE | Freq: Two times a day (BID) | ORAL | 0 refills | Status: DC | PRN

## 2017-10-20 NOTE — Assessment & Plan Note (Signed)
Viral bronchitis most likely. No signs of pneumonia or significant bronchospasm.  He preferred to irrigate his ears himself later.

## 2017-10-20 NOTE — Patient Instructions (Signed)
Good to see you today!  Thanks for coming in.  I think you have viral bronchitis.    Continue to use the cough lozenges, avoid irritants - pollen, smoke cold air  Use the tessalon perles as needed  If you are not better in 3-4 weeks or if you have fever or lots of sputum or shortness of breath then come back

## 2017-10-20 NOTE — Progress Notes (Signed)
Subjective  [name] is a 57 y.o. male is presenting with the following  COUGH  Has been coughing for about 4 days. Cough is: hacky and worse with deep breaths Sputum production: scant Medications tried: Lozenges help Taking blood pressure medications: no  Patient believes might be causing their pain: was around a person last week who was coughing a lot.   Does not usually have seasonal allergies No smoking  Also has abdomen wall pain from coughing  Symptoms Runny nose: mild Mucous in back of throat: mild Throat burning or reflux: no Wheezing or asthma: no Fever: no Chest Pain: no Shortness of breath: no Leg swelling: no Hemoptysis: no Weight loss: no  ROS see HPI Smoking Status noted  Patient on Nardil    Chief Complaint noted Review of Symptoms - see HPI PMH - Smoking status noted.    Objective Vital Signs reviewed BP 110/60 (BP Location: Left Arm, Patient Position: Sitting, Cuff Size: Normal)   Pulse 67   Temp 98.7 F (37.1 C) (Oral)   Wt 270 lb 12.8 oz (122.8 kg)   SpO2 94%   BMI 38.31 kg/m  Heart - Regular rate and rhythm.  No murmurs, gallops or rubs.    Lung - no wheeze but cough brought on by deep nspiration. Neck:  No deformities, thyromegaly, masses, or tenderness noted.   Supple with full range of motion without pain. Ears - cerumen in both.  L TM seen and normal Neck:  No deformities, thyromegaly, masses, or tenderness noted.   Supple with full range of motion without pain. Abdomen: soft without masses, organomegaly or hernias noted.  No guarding or rebound.  Pain increased with tensing rectus muscles   Assessments/Plans  See after visit summary for details of patient instuctions  No problem-specific Assessment & Plan notes found for this encounter.

## 2017-11-08 ENCOUNTER — Ambulatory Visit: Payer: Medicare Other | Admitting: Internal Medicine

## 2017-11-14 ENCOUNTER — Emergency Department (HOSPITAL_BASED_OUTPATIENT_CLINIC_OR_DEPARTMENT_OTHER)
Admission: EM | Admit: 2017-11-14 | Discharge: 2017-11-14 | Disposition: A | Discharge status: Home / Self Care | Payer: Medicare Other

## 2017-11-15 ENCOUNTER — Other Ambulatory Visit: Payer: Self-pay | Admitting: Internal Medicine

## 2017-11-15 ENCOUNTER — Ambulatory Visit (INDEPENDENT_AMBULATORY_CARE_PROVIDER_SITE_OTHER): Payer: Medicare Other | Admitting: Internal Medicine

## 2017-11-15 ENCOUNTER — Other Ambulatory Visit: Payer: Self-pay

## 2017-11-15 ENCOUNTER — Encounter: Payer: Self-pay | Admitting: Internal Medicine

## 2017-11-15 VITALS — BP 132/78 | HR 80 | Temp 98.2°F | Ht 70.0 in | Wt 278.0 lb

## 2017-11-15 DIAGNOSIS — E118 Type 2 diabetes mellitus with unspecified complications: Secondary | ICD-10-CM

## 2017-11-15 DIAGNOSIS — L559 Sunburn, unspecified: Secondary | ICD-10-CM

## 2017-11-15 LAB — POCT GLYCOSYLATED HEMOGLOBIN (HGB A1C): Hemoglobin A1C: 5.7

## 2017-11-15 MED ORDER — MUPIROCIN CALCIUM 2 % EX CREA: 1.0000 "application " | TOPICAL_CREAM | Freq: Two times a day (BID) | CUTANEOUS | 0 refills | Status: DC

## 2017-11-15 MED ORDER — CALAMINE EX LOTN: 1.0000 "application " | TOPICAL_LOTION | Freq: Two times a day (BID) | CUTANEOUS | 0 refills | Status: DC

## 2017-11-15 NOTE — Progress Notes (Signed)
   [facility] Phone: [phone]  Subjective:  [name] is a 57 year old male presenting to clinic with sunburn and a blister on his left foot. He was at [facility] last week and he was out in the sun all day without wearing sunscreen. His sunburn is mostly located on his face, legs, and feet. He has been using aloe vera lotion to help with the sunburn. On Wednesday night, he noticed that he had developed a blister on the top of his left foot. The blister is tender to the touch. He is having some clear drainage. No fevers, no chills, no purulent drainage. He has been putting antibiotic ointment on it.  ROS: See HPI for pertinent positives and negatives  Past Medical History- persistent A-fib, T2DM, hypothyroidism, depression, seizures, obesity  Family history reviewed for today's visit. No changes.  Social history- patient is a never smoker  Objective: BP 132/78   Pulse 80   Temp 98.2 F (36.8 C) (Oral)   Ht 5\' 10"  (1.778 m)   Wt 278 lb (126.1 kg)   SpO2 96%   BMI 39.89 kg/m  Gen: NAD, alert, cooperative with exam Skin: erythematous and peeling skin present on the face, tops of the shoulders, legs, and tops of the feet; partially ruptured blister present on the top of the left foot, no malodorous drainage.     Assessment/Plan: Sunburn: Currently peeling and patient states it is getting better. Blister on top of foot does not appear to be infected.  - Prescribed Calamine lotion to be used on all of his skin - Bactroban ointment bid to blister - Return precautions discussed  Follow-up in 3 months for diabetes, HTN, hypothyroidism   Hyman Bible, MD PGY-3

## 2017-11-15 NOTE — Assessment & Plan Note (Signed)
Currently peeling and patient states it is getting better. Blister on top of foot does not appear to be infected.  - Prescribed Calamine lotion to be used on all of his skin - Bactroban ointment bid to blister - Return precautions discussed

## 2017-11-15 NOTE — Patient Instructions (Signed)
It was so nice to see you today! I'm sorry about your sunburn.  I have sent in a prescription for Bactroban, which is an antibiotic cream. You should use this twice a day to the blister.  You should also use Calamine lotion to the sunburn. You can get this over-the-counter.  If you starting noticing worsening redness that spreads, fevers, or bad smelling, white drainage from the blister, please come back to see [place]!  -Dr. Brett Albino

## 2017-11-16 ENCOUNTER — Ambulatory Visit (INDEPENDENT_AMBULATORY_CARE_PROVIDER_SITE_OTHER): Payer: Medicare Other | Admitting: Psychiatry

## 2017-11-16 ENCOUNTER — Encounter (HOSPITAL_COMMUNITY): Payer: Self-pay | Admitting: Psychiatry

## 2017-11-16 DIAGNOSIS — F251 Schizoaffective disorder, depressive type: Secondary | ICD-10-CM

## 2017-11-16 DIAGNOSIS — F330 Major depressive disorder, recurrent, mild: Secondary | ICD-10-CM

## 2017-11-16 MED ORDER — BENZTROPINE MESYLATE 1 MG PO TABS: 1.0000 mg | ORAL_TABLET | Freq: Every day | ORAL | 0 refills | Status: DC

## 2017-11-16 MED ORDER — PERPHENAZINE 8 MG PO TABS: 24.0000 mg | ORAL_TABLET | Freq: Every day | ORAL | 2 refills | Status: DC

## 2017-11-16 MED ORDER — PHENELZINE SULFATE 15 MG PO TABS: 45.0000 mg | ORAL_TABLET | Freq: Two times a day (BID) | ORAL | 0 refills | Status: DC

## 2017-11-16 MED ORDER — DEPAKOTE ER 250 MG PO TB24: ORAL_TABLET | ORAL | 2 refills | Status: DC

## 2017-11-16 NOTE — Progress Notes (Signed)
[place] MD/PA/NP OP Progress Note  11/16/2017 10:04 AM [name]  MRN:  [id number]  Chief Complaint: I went to [facility] and had a good time.  HPI: Patient came for his follow-up appointment.  He went to [place] with his mother and other family member for 1 week and he really had a good time.  He is very happy because his mother paid for beach vacation.  Patient told he walk on the sand a lot and he had a blister on his ankle.  He saw his primary care physician yesterday.  Overall he describes his mood is good.  He denies any irritability, anger, mania, psychosis.  He found out that his ex girlfriend who moved to [place] got married in February.  Patient is taking his medication as prescribed.  He does not want to change his medication.  His sleep is good.  He is not interested in counseling.  He has no side effects other than mild tremors in his hand.  However these tremors does not interfere in his daily routine.  His energy level is fair.  We had blood work and I reviewed the results with him.  His hemoglobin A1c is normal.  His appetite is okay.  His vital signs are stable.    Visit Diagnosis:    ICD-10-CM   1. Major depressive disorder, recurrent episode, mild (HCC) F33.0 phenelzine (NARDIL) 15 MG tablet    perphenazine (TRILAFON) 8 MG tablet    DEPAKOTE ER 250 MG 24 hr tablet    benztropine (COGENTIN) 1 MG tablet    Past Psychiatric History: Reviewed. Patient has multiple hospitalizations from [phone] due to severe depression, suicidal thoughts with negative thinking but denies any suicidal attempt. He was diagnosed with major depressive disorder, schizoaffective disorder and OCD. He admitted history of paranoia and intrusive thoughts in the past. He had tried numerous medications mostly TCAs. [place] Richardson Landry Curleybut decided to establish care in this office after his physician retired.His last psychiatrist was Dr. De Nurse in [place].We  tried Lamictal but he developed a rash.  Past Medical History:  Past Medical History:  Diagnosis Date  . A-fib ([place])   . Arrhythmia   . Depression   . Depression   . Hypertension   . Low back pain   . Morbid obesity, BMI unknown ([place])   . Osteoarthritis    oa in bilateral knees  . Seizures ([place])   . Thyroid disease   . Varicose veins     Past Surgical History:  Procedure Laterality Date  . ACHILLES TENDON REPAIR  approx. 2004   right foot  . CATARACT EXTRACTION     bilateral  . stab phlebectomy  Right 11/09/2016   stab phlebectomy R leg by Tinnie Gens MD  . TOOTH EXTRACTION      Family Psychiatric History: Reviewed.  Family History:  Family History  Problem Relation Age of Onset  . Diabetes Father   . Heart disease Father   . Diabetes Brother   . OCD Mother     Social History:  Social History   Socioeconomic History  . Marital status: Single    Spouse name: Not on file  . Number of children: Not on file  . Years of education: Not on file  . Highest education level: Not on file  Occupational History  . Occupation: disabled  Social Needs  . Financial resource strain: Not on file  . Food insecurity:    Worry: Not on file  Inability: Not on file  . Transportation needs:    Medical: Not on file    Non-medical: Not on file  Tobacco Use  . Smoking status: Never Smoker  . Smokeless tobacco: Never Used  Substance and Sexual Activity  . Alcohol use: No    Alcohol/week: 0.0 oz  . Drug use: No  . Sexual activity: Never  Lifestyle  . Physical activity:    Days per week: Not on file    Minutes per session: Not on file  . Stress: Not on file  Relationships  . Social connections:    Talks on phone: Not on file    Gets together: Not on file    Attends religious service: Not on file    Active member of club or organization: Not on file    Attends meetings of clubs or organizations: Not on file    Relationship status: Not on file  Other Topics Concern   . Not on file  Social History Narrative  . Not on file    Allergies:  Allergies  Allergen Reactions  . Lamictal [Lamotrigine] Rash  . Metformin And Related Rash    Metabolic Disorder Labs: Recent Results (from the past 2160 hour(s))  HgB A1c     Status: None   Collection Time: 11/15/17 11:20 AM  Result Value Ref Range   Hemoglobin A1C 5.7    Lab Results  Component Value Date   HGBA1C 5.7 11/15/2017   MPG 146 (H) 04/24/2015   MPG 140 02/05/2007   No results found for: PROLACTIN Lab Results  Component Value Date   CHOL 206 (H) 07/16/2016   TRIG 299 (H) 07/16/2016   HDL 24 (L) 07/16/2016   CHOLHDL 8.6 (H) 07/16/2016   VLDL 60 (H) 07/16/2016   LDLCALC 122 (H) 07/16/2016   LDLCALC 112 04/30/2015   Lab Results  Component Value Date   TSH 1.23 12/23/2015   TSH 2.076 04/30/2015    Therapeutic Level Labs: No results found for: LITHIUM Lab Results  Component Value Date   VALPROATE 72 08/17/2017   VALPROATE 46 (L) 12/07/2016   No components found for:  CBMZ  Current Medications: Current Outpatient Medications  Medication Sig Dispense Refill  . allopurinol (ZYLOPRIM) 300 MG tablet TAKE 1 TABLET BY MOUTH EVERY DAY 90 tablet 0  . apixaban (ELIQUIS) 5 MG TABS tablet Take 1 tablet (5 mg total) by mouth 2 (two) times daily. 60 tablet 11  . benzonatate (TESSALON) 100 MG capsule Take 1 capsule (100 mg total) by mouth 2 (two) times daily as needed for cough. 20 capsule 0  . benztropine (COGENTIN) 1 MG tablet Take 1 tablet (1 mg total) by mouth daily. 90 tablet 0  . calamine lotion Apply 1 application topically 2 (two) times daily. 177 mL 0  . DEPAKOTE ER 250 MG 24 hr tablet Take 2 tab in am and 2 at bed time 120 tablet 2  . desonide (DESOWEN) 0.05 % cream Apply 1 application topically daily as needed (dry skin).    Marland Kitchen glipiZIDE (GLUCOTROL XL) 5 MG 24 hr tablet TAKE 1 TABLET BY MOUTH EVERY DAY WITH BREAKFAST 90 tablet 0  . ketoconazole (NIZORAL) 2 % shampoo Apply 1  application topically 2 (two) times a week. 120 mL 0  . levothyroxine (SYNTHROID, LEVOTHROID) 100 MCG tablet Take 1 tablet (100 mcg total) by mouth daily. 90 tablet 3  . mupirocin ointment (BACTROBAN) 2 % Please specify directions, refills and quantity 1 g 0  . perphenazine (  TRILAFON) 8 MG tablet Take 3 tablets (24 mg total) by mouth at bedtime. 90 tablet 2  . phenelzine (NARDIL) 15 MG tablet Take 3 tablets (45 mg total) by mouth 2 (two) times daily. 540 tablet 0  . silver sulfADIAZINE (SILVADENE) 1 % cream Apply 1 application topically daily as needed (for rash). 50 g 2  . triamcinolone ointment (KENALOG) 0.5 % Apply 1 application topically 2 (two) times daily. For moderate to severe eczema.  Do not use for more than 1 week at a time. 60 g 3   No current facility-administered medications for this visit.      Musculoskeletal: Strength & Muscle Tone: within normal limits Gait & Station: normal Patient leans: N/A  Psychiatric Specialty Exam: ROS  Blood pressure 126/74, pulse 75, height 5' 10.5" (1.791 m), weight 273 lb 9.6 oz (124.1 kg).There is no height or weight on file to calculate BMI.  General Appearance: Casual  Eye Contact:  Good  Speech:  Clear and Coherent  Volume:  Normal  Mood:  Euthymic  Affect:  Congruent  Thought Process:  Goal Directed  Orientation:  Full (Time, Place, and Person)  Thought Content: Logical   Suicidal Thoughts:  No  Homicidal Thoughts:  No  Memory:  Immediate;   Good Recent;   Good Remote;   Good  Judgement:  Good  Insight:  Good  Psychomotor Activity:  mild tremors in hand  Concentration:  Concentration: Good and Attention Span: Fair  Recall:  Good  Fund of Knowledge: Good  Language: Good  Akathisia:  No  Handed:  Right  AIMS (if indicated): not done  Assets:  Communication Skills Desire for Improvement Housing Resilience  ADL's:  Intact  Cognition: WNL  Sleep:  Good   Screenings: PHQ2-9     Office Visit from 11/15/2017 in [facility] Office Visit from 04/15/2017 in [facility] Office Visit from 12/23/2015 in [facility] Office Visit from 06/03/2015 in [facility] Counselor from 05/21/2015 in [facility]  PHQ-2 Total Score  0  0  0  3  1  PHQ-9 Total Score  -  -  -  5  -       Assessment and Plan: Major depressive disorder, recurrent.  Schizoaffective disorder, depressed type.  I reviewed blood work results and records from his primary care physician.  His Depakote level is therapeutic and his hemoglobin A1c is normal.  Discussed medication side effects and benefits.  Patient has blister on his ankle while he was walking on the sand.  He is getting better.  I will continue Nardil 45 mg twice a day, Trilafon 24 mg at bedtime, Cogentin 1 mg daily and Depakote 500 mg twice a day.  His Depakote level is therapeutic.  Discussed interaction of monoamide oxidase inhibitors and dietary restrictions.  Patient is aware about side effects.  I recommended to call us back if he has any question or any concern.  Follow-up in 3 months.  Patient is not interested in counseling.   Kathlee Nations, MD 11/16/2017, 10:04 AM

## 2017-12-09 ENCOUNTER — Other Ambulatory Visit: Payer: Self-pay | Admitting: Internal Medicine

## 2017-12-29 ENCOUNTER — Other Ambulatory Visit: Payer: Self-pay | Admitting: *Deleted

## 2017-12-29 MED ORDER — LEVOTHYROXINE SODIUM 100 MCG PO TABS: 100.0000 ug | ORAL_TABLET | Freq: Every day | ORAL | 3 refills | Status: DC

## 2017-12-29 NOTE — Telephone Encounter (Signed)
Fax received from pharmacy for patient. Sahiti Joswick,CMA

## 2018-01-04 ENCOUNTER — Other Ambulatory Visit (HOSPITAL_COMMUNITY): Payer: Self-pay | Admitting: Psychiatry

## 2018-01-04 DIAGNOSIS — F330 Major depressive disorder, recurrent, mild: Secondary | ICD-10-CM

## 2018-01-06 ENCOUNTER — Encounter (HOSPITAL_COMMUNITY): Payer: Self-pay | Admitting: Emergency Medicine

## 2018-01-06 ENCOUNTER — Emergency Department (HOSPITAL_COMMUNITY)
Admission: EM | Admit: 2018-01-06 | Discharge: 2018-01-07 | Disposition: A | Discharge status: Home / Self Care | Payer: Medicare Other | Attending: Emergency Medicine | Admitting: Emergency Medicine

## 2018-01-06 ENCOUNTER — Other Ambulatory Visit: Payer: Self-pay

## 2018-01-06 DIAGNOSIS — I10 Essential (primary) hypertension: Secondary | ICD-10-CM | POA: Insufficient documentation

## 2018-01-06 DIAGNOSIS — E119 Type 2 diabetes mellitus without complications: Secondary | ICD-10-CM | POA: Insufficient documentation

## 2018-01-06 DIAGNOSIS — Z7901 Long term (current) use of anticoagulants: Secondary | ICD-10-CM | POA: Diagnosis not present

## 2018-01-06 DIAGNOSIS — R079 Chest pain, unspecified: Secondary | ICD-10-CM | POA: Diagnosis not present

## 2018-01-06 DIAGNOSIS — R072 Precordial pain: Secondary | ICD-10-CM

## 2018-01-06 DIAGNOSIS — R0789 Other chest pain: Secondary | ICD-10-CM | POA: Diagnosis present

## 2018-01-06 DIAGNOSIS — E039 Hypothyroidism, unspecified: Secondary | ICD-10-CM | POA: Diagnosis not present

## 2018-01-06 DIAGNOSIS — R42 Dizziness and giddiness: Secondary | ICD-10-CM | POA: Insufficient documentation

## 2018-01-06 DIAGNOSIS — Z7984 Long term (current) use of oral hypoglycemic drugs: Secondary | ICD-10-CM | POA: Diagnosis not present

## 2018-01-06 NOTE — ED Triage Notes (Signed)
Pt from home.  Pt states his chest began hurting yesterday. Constant.  Dull, "shooting up left arm"  No nausea, shob.  No chills.

## 2018-01-07 ENCOUNTER — Emergency Department (HOSPITAL_COMMUNITY): Payer: Medicare Other

## 2018-01-07 DIAGNOSIS — R072 Precordial pain: Secondary | ICD-10-CM | POA: Diagnosis not present

## 2018-01-07 DIAGNOSIS — R42 Dizziness and giddiness: Secondary | ICD-10-CM | POA: Diagnosis not present

## 2018-01-07 DIAGNOSIS — R079 Chest pain, unspecified: Secondary | ICD-10-CM | POA: Diagnosis not present

## 2018-01-07 LAB — BASIC METABOLIC PANEL
Anion gap: 8 (ref 5–15)
BUN: 6 mg/dL (ref 6–20)
CO2: 30 mmol/L (ref 22–32)
Calcium: 9.1 mg/dL (ref 8.9–10.3)
Chloride: 101 mmol/L (ref 98–111)
Creatinine, Ser: 0.92 mg/dL (ref 0.61–1.24)
GFR calc Af Amer: >60 mL/min (ref >60)
GFR calc non Af Amer: >60 mL/min (ref >60)
Glucose, Bld: 116 mg/dL — ABNORMAL HIGH (ref 70–99)
Potassium: 4.1 mmol/L (ref 3.5–5.1)
Sodium: 139 mmol/L (ref 135–145)

## 2018-01-07 LAB — I-STAT TROPONIN, ED
Troponin i, poc: 0 ng/mL (ref 0.00–0.08)
Troponin i, poc: 0 ng/mL (ref 0.00–0.08)

## 2018-01-07 LAB — CBC
HCT: 49.4 % (ref 39.0–52.0)
Hemoglobin: 15.7 g/dL (ref 13.0–17.0)
MCH: 29.5 pg (ref 26.0–34.0)
MCHC: 31.8 g/dL (ref 30.0–36.0)
MCV: 92.9 fL (ref 78.0–100.0)
Platelets: 221 10*3/uL (ref 150–400)
RBC: 5.32 MIL/uL (ref 4.22–5.81)
RDW: 14.7 % (ref 11.5–15.5)
WBC: 8 10*3/uL (ref 4.0–10.5)

## 2018-01-07 MED ORDER — IOPAMIDOL (ISOVUE-370) INJECTION 76%
50.0000 mL | Freq: Once | INTRAVENOUS | Status: AC | PRN
  Administered 2018-01-07: 50 mL via INTRAVENOUS

## 2018-01-07 MED ORDER — IOPAMIDOL (ISOVUE-370) INJECTION 76%
INTRAVENOUS | Status: AC
  Filled 2018-01-07: qty 50

## 2018-01-07 NOTE — ED Notes (Signed)
Patient left at this time with all belongings. 

## 2018-01-07 NOTE — ED Provider Notes (Signed)
[facility] EMERGENCY DEPARTMENT Provider Note   CSN: [id number] Arrival date & time: 01/06/18  2349     History   Chief Complaint Chief Complaint  Patient presents with  . Chest Pain    HPI [name] is a 57 y.o. male.  Patient presents to the emergency department with a chief complaint of 3 complaints.  1.  Chest pain: Patient reports having constant dull chest pain that started yesterday that radiates to his left arm.  He denies any associated shortness of breath, nausea, or diaphoresis.  He states that he has felt gassy.  He tried drinking some ginger ale with no relief.  He has a history of A. fib, and is anticoagulated on Eliquis, he has been taking his medications.  2.  Dizziness: Patient reports having pain on the left side of his neck.  He states that when he bends his neck he feels very dizzy, but has no history of vertigo.  He denies headache.  Denies fevers or chills.  Denies numbness, weakness, or tingling.  3.  Abdominal pain: Patient reports feeling bloated and gassy for several days.  He has taken ginger ale with little relief.  He denies any nausea, vomiting, diarrhea.  Denies any fevers chills.  The history is provided by the patient. No language interpreter was used.    Past Medical History:  Diagnosis Date  . A-fib ([place])   . Arrhythmia   . Depression   . Depression   . Hypertension   . Low back pain   . Morbid obesity, BMI unknown ([place])   . Osteoarthritis    oa in bilateral knees  . Seizures ([place])   . Thyroid disease   . Varicose veins     Patient Active Problem List   Diagnosis Date Noted  . Sunburn 11/15/2017  . Cough 10/20/2017  . Left foot pain 04/16/2017  . Contact dermatitis 04/16/2017  . Varicose veins of bilateral lower extremities with other complications 78/93/8101  . Erectile dysfunction 04/26/2015  . Hypothyroidism   . Seizures ([place])   . Depression   . Persistent atrial fibrillation ([place])   . Obesity,  Class III, BMI 40-49.9 (morbid obesity) ([place])   . T2DM (type 2 diabetes mellitus) ([place]) 02/10/2007    Past Surgical History:  Procedure Laterality Date  . ACHILLES TENDON REPAIR  approx. 2004   right foot  . CATARACT EXTRACTION     bilateral  . stab phlebectomy  Right 11/09/2016   stab phlebectomy R leg by Tinnie Gens MD  . [place] Medications    Prior to Admission medications   Medication Sig Start Date End Date Taking? Authorizing Provider  allopurinol (ZYLOPRIM) 300 MG tablet TAKE 1 TABLET BY MOUTH EVERY DAY 12/10/17   Mayo, Pete Pelt, MD  apixaban (ELIQUIS) 5 MG TABS tablet Take 1 tablet (5 mg total) by mouth 2 (two) times daily. 04/12/17   Mayo, Pete Pelt, MD  benztropine (COGENTIN) 1 MG tablet Take 1 tablet (1 mg total) by mouth daily. 11/16/17   Arfeen, Arlyce Harman, MD  calamine lotion Apply 1 application topically 2 (two) times daily. 11/15/17   Mayo, Pete Pelt, MD  DEPAKOTE ER 250 MG 24 hr tablet Take 2 tab in am and 2 at bed time 11/16/17   Arfeen, Arlyce Harman, MD  desonide (DESOWEN) 0.05 % cream Apply 1 application topically daily as needed (dry skin).    [provider]  glipiZIDE (GLUCOTROL XL) 5 MG 24 hr tablet TAKE 1 TABLET BY MOUTH EVERY DAY WITH BREAKFAST 12/10/17   Mayo, Pete Pelt, MD  ketoconazole (NIZORAL) 2 % shampoo Apply 1 application topically 2 (two) times a week. 12/23/15   Vivi Barrack, MD  levothyroxine (SYNTHROID, LEVOTHROID) 100 MCG tablet Take 1 tablet (100 mcg total) by mouth daily. 12/29/17   Mayo, Pete Pelt, MD  mupirocin ointment Drue Stager) 2 % Please specify directions, refills and quantity 11/16/17   Mayo, Pete Pelt, MD  perphenazine (TRILAFON) 8 MG tablet Take 3 tablets (24 mg total) by mouth at bedtime. 11/16/17   Arfeen, Arlyce Harman, MD  phenelzine (NARDIL) 15 MG tablet Take 3 tablets (45 mg total) by mouth 2 (two) times daily. 11/16/17   Arfeen, Arlyce Harman, MD  silver sulfADIAZINE (SILVADENE) 1 % cream Apply 1 application topically daily  as needed (for rash). 06/11/17   Mayo, Pete Pelt, MD  triamcinolone ointment (KENALOG) 0.5 % Apply 1 application topically 2 (two) times daily. For moderate to severe eczema.  Do not use for more than 1 week at a time. 04/15/17   Mayo, Pete Pelt, MD    Family History Family History  Problem Relation Age of Onset  . Diabetes Father   . Heart disease Father   . Diabetes Brother   . OCD Mother     Social History Social History   Tobacco Use  . Smoking status: Never Smoker  . Smokeless tobacco: Never Used  Substance Use Topics  . Alcohol use: No    Alcohol/week: 0.0 oz  . Drug use: No     Allergies   Lamictal [lamotrigine] and Metformin and related   Review of Systems Review of Systems  All other systems reviewed and are negative.    Physical Exam Updated Vital Signs BP 118/82   Pulse 70   Resp 19   SpO2 95%   Physical Exam  Constitutional: He is oriented to person, place, and time. He appears well-developed and well-nourished.  HENT:  Head: Normocephalic and atraumatic.  Eyes: Pupils are equal, round, and reactive to light. Conjunctivae and EOM are normal. Right eye exhibits no discharge. Left eye exhibits no discharge. No scleral icterus.  Neck: Normal range of motion. Neck supple. No JVD present.  Cardiovascular: Normal rate, regular rhythm and normal heart sounds. Exam reveals no gallop and no friction rub.  No murmur heard. Pulmonary/Chest: Effort normal and breath sounds normal. No respiratory distress. He has no wheezes. He has no rales. He exhibits no tenderness.  Abdominal: Soft. He exhibits no distension and no mass. There is no tenderness. There is no rebound and no guarding.  Musculoskeletal: Normal range of motion. He exhibits no edema or tenderness.  Neurological: He is alert and oriented to person, place, and time.  Skin: Skin is warm and dry.  Psychiatric: He has a normal mood and affect. His behavior is normal. Judgment and thought content normal.    Nursing note and vitals reviewed.    ED Treatments / Results  Labs (all labs ordered are listed, but only abnormal results are displayed) Labs Reviewed  BASIC METABOLIC PANEL - Abnormal; Notable for the following components:      Result Value   Glucose, Bld 116 (*)    All other components within normal limits  CBC  I-STAT TROPONIN, ED    EKG None ED ECG REPORT  I personally interpreted this EKG   Date: 01/07/2018   Rate: 87  Rhythm:  atrial fibrillation  QRS Axis: normal  Intervals: normal  ST/T Wave abnormalities: normal  Conduction Disutrbances:none  Narrative Interpretation:   Old EKG Reviewed: none available   Radiology Dg Chest 2 View  Result Date: 01/07/2018 CLINICAL DATA:  Chest pain EXAM: CHEST - 2 VIEW COMPARISON:  03/25/2017 FINDINGS: No acute pulmonary opacity or pleural effusion. Stable cardiomediastinal silhouette. No pneumothorax. IMPRESSION: No active cardiopulmonary disease. Electronically Signed   By: Donavan Foil M.D.   On: 01/07/2018 00:17    Procedures Procedures (including critical care time)  Medications Ordered in ED Medications - No data to display   Initial Impression / Assessment and Plan / ED Course  I have reviewed the triage vital signs and the nursing notes.  Pertinent labs & imaging results that were available during my care of the patient were reviewed by me and considered in my medical decision making (see chart for details).     Patient with chest pain since yesterday.  Troponin is negative.  Pain is been constant.  EKG shows no ischemic changes.  Doubt ACS.    I am concerned about the patient's dizziness when he bends his neck.  Will check CT angio to evaluate cervical vasculature.  CT imaging is reassuring.  Laboratory work-up is also reassuring.  Delta troponin is negative.  No evidence of emergent etiology of the patient's symptoms.  Recommend primary care follow-up.  Patient understands and agrees with the plan.  He is  stable and ready for discharge.  Final Clinical Impressions(s) / ED Diagnoses   Final diagnoses:  Precordial pain  Dizziness    ED Discharge Orders    None       Montine Circle, PA-C 01/07/18 [place], April, MD 01/07/18 [phone]

## 2018-01-12 ENCOUNTER — Other Ambulatory Visit (HOSPITAL_COMMUNITY): Payer: Self-pay

## 2018-01-12 DIAGNOSIS — F330 Major depressive disorder, recurrent, mild: Secondary | ICD-10-CM

## 2018-01-12 MED ORDER — PERPHENAZINE 8 MG PO TABS: 24.0000 mg | ORAL_TABLET | Freq: Every day | ORAL | 0 refills | Status: DC

## 2018-02-16 ENCOUNTER — Ambulatory Visit (HOSPITAL_COMMUNITY): Payer: Self-pay | Admitting: Psychiatry

## 2018-02-23 ENCOUNTER — Encounter: Payer: Self-pay | Admitting: Family Medicine

## 2018-02-23 ENCOUNTER — Ambulatory Visit (INDEPENDENT_AMBULATORY_CARE_PROVIDER_SITE_OTHER): Payer: Medicare Other | Admitting: Family Medicine

## 2018-02-23 VITALS — BP 124/70 | HR 86 | Temp 97.9°F | Ht 71.0 in | Wt 275.6 lb

## 2018-02-23 DIAGNOSIS — R29898 Other symptoms and signs involving the musculoskeletal system: Secondary | ICD-10-CM

## 2018-02-23 DIAGNOSIS — E118 Type 2 diabetes mellitus with unspecified complications: Secondary | ICD-10-CM | POA: Diagnosis not present

## 2018-02-23 LAB — POCT GLYCOSYLATED HEMOGLOBIN (HGB A1C): HbA1c, POC (controlled diabetic range): 5.7 % (ref 0.0–7.0)

## 2018-02-23 NOTE — Patient Instructions (Addendum)
Good to see you today!  Thanks for coming in.  If your joints hurt - Tylenol as needed  For the weight - sweets - less - more veggies - smaller portions - 1-2 lbs a week   Make an appointment to Dr Pilar Plate for a follow up in the next 1-2 months  Consider a colonoscopy

## 2018-02-23 NOTE — Assessment & Plan Note (Signed)
Worsening.  Discussed approaches.  See after visit summary

## 2018-02-23 NOTE — Progress Notes (Signed)
Subjective  [name] is a 57 y.o. male is presenting with the following  JOINT PAIN Location: Intermittently hears popping in his left hip with sometimes mild discomfort. Sometimes his knees also pop and feel "unusual"  No specific tender area or soft tissue swelling or redness or limitation in activity.  His is concerned he may be heading for arthritis  Rash: no Fever:  no Did note he had been gaining weight   OBESITY Current weight/BMI : Body mass index is 38.44 kg/m.    How long have been obese:  years Course:  He did loose weight several years ago with exercise Problems or symptoms it causes:  Pain in his legs and arthritis   Things have tried to improve:  Exercise but has been too hot.  He feels he eats too many sweets.   He was on metformin for diabetes but stopped due to a rash.  Has not tried again   Chief Complaint noted Review of Symptoms - see HPI PMH - Smoking status noted.    No FHx of problems with arthritis   Objective Vital Signs reviewed BP 124/70 (BP Location: Right Arm, Patient Position: Sitting, Cuff Size: Normal)   Pulse 86   Temp 97.9 F (36.6 C) (Oral)   Ht 5\' 11"  (1.803 m)   Wt 275 lb 9.6 oz (125 kg)   SpO2 95%   BMI 38.44 kg/m  Able to walk on heels and toes and do 3 moderate deep knee bends without pain or problems FROM in hips knees and ankle without crepitus.  No effusions or redness or focal   Assessments/Plans  JOINT DISCOMFORT Normal exam and does not seem to cause limitations.  Discussed prevention with regular exercise and especially weight loss.  s  Obesity, Class III, BMI 40-49.9 (morbid obesity) Worsening.  Discussed approaches.  See after visit summary

## 2018-02-27 ENCOUNTER — Other Ambulatory Visit (HOSPITAL_COMMUNITY): Payer: Self-pay | Admitting: Psychiatry

## 2018-02-27 DIAGNOSIS — F330 Major depressive disorder, recurrent, mild: Secondary | ICD-10-CM

## 2018-03-03 ENCOUNTER — Other Ambulatory Visit (HOSPITAL_COMMUNITY): Payer: Self-pay

## 2018-03-03 DIAGNOSIS — F330 Major depressive disorder, recurrent, mild: Secondary | ICD-10-CM

## 2018-03-03 DIAGNOSIS — H524 Presbyopia: Secondary | ICD-10-CM | POA: Diagnosis not present

## 2018-03-03 DIAGNOSIS — E119 Type 2 diabetes mellitus without complications: Secondary | ICD-10-CM | POA: Diagnosis not present

## 2018-03-03 MED ORDER — BENZTROPINE MESYLATE 1 MG PO TABS: 1.0000 mg | ORAL_TABLET | Freq: Every day | ORAL | 0 refills | Status: DC

## 2018-03-08 ENCOUNTER — Other Ambulatory Visit: Payer: Self-pay

## 2018-03-11 MED ORDER — ALLOPURINOL 300 MG PO TABS: 300.0000 mg | ORAL_TABLET | Freq: Every day | ORAL | 3 refills | Status: DC

## 2018-03-11 MED ORDER — GLIPIZIDE ER 5 MG PO TB24: 5.0000 mg | ORAL_TABLET | Freq: Every day | ORAL | 3 refills | Status: DC

## 2018-04-06 ENCOUNTER — Ambulatory Visit (HOSPITAL_COMMUNITY): Payer: Self-pay | Admitting: Psychiatry

## 2018-04-08 ENCOUNTER — Encounter: Payer: Self-pay | Admitting: Family Medicine

## 2018-04-08 ENCOUNTER — Ambulatory Visit (INDEPENDENT_AMBULATORY_CARE_PROVIDER_SITE_OTHER): Payer: Medicare Other | Admitting: Family Medicine

## 2018-04-08 ENCOUNTER — Other Ambulatory Visit: Payer: Self-pay

## 2018-04-08 VITALS — BP 128/82 | HR 94 | Temp 98.0°F | Wt 271.4 lb

## 2018-04-08 DIAGNOSIS — E119 Type 2 diabetes mellitus without complications: Secondary | ICD-10-CM | POA: Diagnosis not present

## 2018-04-08 DIAGNOSIS — F251 Schizoaffective disorder, depressive type: Secondary | ICD-10-CM | POA: Diagnosis not present

## 2018-04-08 DIAGNOSIS — L304 Erythema intertrigo: Secondary | ICD-10-CM | POA: Insufficient documentation

## 2018-04-08 MED ORDER — NYSTATIN 100000 UNIT/GM EX POWD: Freq: Four times a day (QID) | CUTANEOUS | 0 refills | Status: AC

## 2018-04-08 NOTE — Assessment & Plan Note (Signed)
Patient's history and physical exam are consistent with intertrigo.  The does not appear to be significant yeast infection at this time but will treat for yeast empirically. -Nystatin powder.  Apply nystatin powder to the groin and beneath pannus 4 times daily for the next 3 days.  This will help with any potential fungal infection. -Discussed drying routine after showering which can be helpful.  We also mentioned the use of baby powder.  Also cotton lined underwear sensitive elastic lined underwear may reduce irritation.

## 2018-04-08 NOTE — Assessment & Plan Note (Signed)
Regarding her concern of her perphenazine: This would be a good issue to take up with your psychiatrist.  He will be more familiar with this medication in your past history of psychiatric medication.  It appears you have already started trying to schedule an appointment with him I would encourage you to follow-up.

## 2018-04-08 NOTE — Assessment & Plan Note (Addendum)
It appears that your last 3 A1c's have been 5.7, 5.7, 5.9.  This is a great indication to your diabetes is well controlled.  Do not restart glipizide or any other glucose lowering agents.  It sounds like you have been off of glipizide for 1 month so I would like to recheck your A1c in 2 months to assess your blood sugar as entirely off of medication.  At a future visit we can discuss possibility of using medications like statins, ACE-I, ASA, metformin.

## 2018-04-08 NOTE — Patient Instructions (Addendum)
Phenelzine: To better address this medication, meet with your psychiatrist who prescribes it and is more familiar with this medication.  Glipizide: Let's stop this medication for now and see how you're doing in 2 months.  Less medication is a good thing!  Intertrigo: Lot's of information below.  Intertrigo Intertrigo is skin irritation or inflammation (dermatitis) that occurs when folds of skin rub together. The irritation can cause a rash and make skin raw and itchy. This condition most commonly occurs in the skin folds of these areas:  Toes.  Armpits.  Groin.  Belly.  Breasts.  Buttocks.  Intertrigo is not passed from person to person (is not contagious). What are the causes? This condition is caused by heat, moisture, friction, and lack of air circulation. The condition can be made worse by:  Sweat.  Bacteria or a fungus, such as yeast.  What increases the risk? This condition is more likely to occur if you have moisture in your skin folds. It is also more likely to develop in people who:  Have diabetes.  Are overweight.  Are on bed rest.  Live in a warm and moist climate.  Wear splints, braces, or other medical devices.  Are not able to control their bowels or bladder (have incontinence).  What are the signs or symptoms? Symptoms of this condition include:  A pink or red skin rash.  Brown patches on the skin.  Raw or scaly skin.  Itchiness.  A burning feeling.  Bleeding.  Leaking fluid.  A bad smell.  How is this diagnosed? This condition is diagnosed with a medical history and physical exam. You may also have a skin swab to test for bacteria or a fungus, such as yeast. How is this treated? Treatment may include:  Cleaning and drying your skin.  An oral antibiotic medicine or antibiotic skin cream for a bacterial infection.  Antifungal cream or pills for an infection that was caused by a fungus, such as yeast.  Steroid ointment to relieve  itchiness and irritation.  Follow these instructions at home:  Keep the affected area clean and dry.  Do not scratch your skin.  Stay in a cool environment as much as possible. Use an air conditioner or fan, if available.  Apply over-the-counter and prescription medicines only as told by your health care provider.  If you were prescribed an antibiotic medicine, use it as told by your health care provider. Do not stop using the antibiotic even if your condition improves.  Keep all follow-up visits as told by your health care provider. This is important. How is this prevented?  Maintain a healthy weight.  Take care of your feet, especially if you have diabetes. Foot care includes: ? Wearing shoes that fit well. ? Keeping your feet dry. ? Wearing clean, breathable socks.  Protect the skin around your groin and buttocks, especially if you have incontinence. Skin protection includes: ? Following a regular cleaning routine. ? Using moisturizers and skin protectants. ? Changing protection pads frequently.  Do not wear tight clothes. Wear clothes that are loose and absorbent. Wear clothes that are made of cotton.  Wear a bra that gives good support, if needed.  Shower and dry yourself thoroughly after activity. Use a hair dryer on a cool setting to dry between skin folds, especially after you bathe.  If you have diabetes, keep your blood sugar under control. Contact a health care provider if:  Your symptoms do not improve with treatment.  Your symptoms get worse  or they spread.  You notice increased redness and warmth.  You have a fever. This information is not intended to replace advice given to you by your health care provider. Make sure you discuss any questions you have with your health care provider. Document Released: 06/22/2005 Document Revised: 11/28/2015 Document Reviewed: 12/24/2014 Elsevier Interactive Patient Education  2018 [employer].

## 2018-04-08 NOTE — Progress Notes (Signed)
Subjective:  [name] is a 57 y.o. male who presents to the [facility] today with a chief complaint of rash.   HPI: Perphenazine: [name] takes perphenazine for his psychiatric problems which include major depressive disorder and schizoaffective disorder.  He has been following with Dr. Adele Schilder and is working on scheduling an appointment soon. He is noticed that recently the manufacture of his perphenazine has changed.  The color and shape of his pill is now different.  He suspects that the perphenazine no longer works as well now that [employer] is different.  He would like to know if an different medication would be helpful.  Glipizide: He was recently told by a pharmacist that his glipizide may interact with another medication (he is not sure which) and cause dizziness.  Since that comment, about 1 month ago, he discontinued his glipizide.  He thinks that he previously had some dizziness but does not notice any significant improvement since the discontinuation of his glipizide.  His past A1c readings have been under 6.  Groin rash: He has noticed some mild itching and rash in his groin for the past several years.  Mostly under his pannus and on his inner thigh.  He notices that when he showers this area becomes itchy smells unpleasant.  He is previously been seen by dermatologist who recommended a cream (unsure which) which made the area more mushy but did not fix his problem.  He would now like to know if there is anything more that he can do to help if this rash.  He does not notice any significant drainage or scaling.  Problem  Intertrigo  Schizoaffective Disorder, Depressive Type (Hcc)  T2DM (type 2 diabetes mellitus) (HCC)   Qualifier: Diagnosis of  By: Jobe Igo MD, David        Objective:  Physical Exam: BP 128/82   Pulse 94   Temp 98 F (36.7 C) (Oral)   Wt 123.1 kg   SpO2 92%   BMI 37.85 kg/m   Gen: NAD, resting comfortably Pelvis: Pink patches under panus  and bilaterally along medial thigh. No erythema, no drainage, not malodorous, no scaling.    Assessment/Plan:  Intertrigo Patient's history and physical exam are consistent with intertrigo.  The does not appear to be significant yeast infection at this time but will treat for yeast empirically. -Nystatin powder.  Apply nystatin powder to the groin and beneath pannus 4 times daily for the next 3 days.  This will help with any potential fungal infection. -Discussed drying routine after showering which can be helpful.  We also mentioned the use of baby powder.  Also cotton lined underwear sensitive elastic lined underwear may reduce irritation.  Schizoaffective disorder, depressive type ([place]) Regarding her concern of her perphenazine: This would be a good issue to take up with your psychiatrist.  He will be more familiar with this medication in your past history of psychiatric medication.  It appears you have already started trying to schedule an appointment with him I would encourage you to follow-up.  T2DM (type 2 diabetes mellitus) ([place]) It appears that your last 3 A1c's have been 5.7, 5.7, 5.9.  This is a great indication to your diabetes is well controlled.  Do not restart glipizide or any other glucose lowering agents.  It sounds like you have been off of glipizide for 1 month so I would like to recheck your A1c in 2 months to assess your blood sugar as entirely off of medication.  At a future visit we can discuss possibility of using medications like statins, ACE-I, ASA, metformin.

## 2018-04-20 ENCOUNTER — Other Ambulatory Visit: Payer: Self-pay

## 2018-04-21 NOTE — Telephone Encounter (Signed)
2nd request received. Alisa Brake, RN ([facility] RN)  

## 2018-04-22 ENCOUNTER — Other Ambulatory Visit: Payer: Self-pay

## 2018-04-22 ENCOUNTER — Other Ambulatory Visit: Payer: Self-pay | Admitting: Family Medicine

## 2018-04-22 ENCOUNTER — Encounter: Payer: Self-pay | Admitting: Family Medicine

## 2018-04-22 ENCOUNTER — Ambulatory Visit (INDEPENDENT_AMBULATORY_CARE_PROVIDER_SITE_OTHER): Payer: Medicare Other | Admitting: Family Medicine

## 2018-04-22 VITALS — BP 110/80 | HR 87 | Temp 98.3°F | Wt 270.0 lb

## 2018-04-22 DIAGNOSIS — L719 Rosacea, unspecified: Secondary | ICD-10-CM | POA: Diagnosis not present

## 2018-04-22 DIAGNOSIS — H43811 Vitreous degeneration, right eye: Secondary | ICD-10-CM | POA: Diagnosis not present

## 2018-04-22 DIAGNOSIS — H524 Presbyopia: Secondary | ICD-10-CM | POA: Diagnosis not present

## 2018-04-22 DIAGNOSIS — H52223 Regular astigmatism, bilateral: Secondary | ICD-10-CM | POA: Diagnosis not present

## 2018-04-22 DIAGNOSIS — L219 Seborrheic dermatitis, unspecified: Secondary | ICD-10-CM | POA: Diagnosis not present

## 2018-04-22 MED ORDER — DESONIDE 0.05 % EX CREA: TOPICAL_CREAM | CUTANEOUS | 0 refills | Status: DC

## 2018-04-22 MED ORDER — METRONIDAZOLE 1 % EX GEL: Freq: Every day | CUTANEOUS | 0 refills | Status: DC

## 2018-04-22 NOTE — Progress Notes (Signed)
Subjective:     Patient ID: [name], male   DOB: 09/16/1960, 57 y.o.   MRN: [id number]  Rash  This is a chronic (Rash on his nose which is red and at times bleed) problem. Episode onset: More than 6 months. The problem has been waxing and waning since onset. Location: On and around his nose. The rash is characterized by redness and itchiness (Occasional bleeding if he touches it too much). He was exposed to nothing. Associated symptoms include coughing. Pertinent negatives include no eye pain, facial edema, fever or shortness of breath. (At times coughs in his sleep) Past treatments include moisturizer (Skin lotion). The treatment provided mild relief. There is no history of allergies, asthma or eczema.   Current Outpatient Medications on File Prior to Visit  Medication Sig Dispense Refill  . allopurinol (ZYLOPRIM) 300 MG tablet Take 1 tablet (300 mg total) by mouth daily. 90 tablet 3  . apixaban (ELIQUIS) 5 MG TABS tablet Take 1 tablet (5 mg total) by mouth 2 (two) times daily. 60 tablet 11  . benztropine (COGENTIN) 1 MG tablet Take 1 tablet (1 mg total) by mouth daily. 60 tablet 0  . DEPAKOTE ER 250 MG 24 hr tablet Take 2 tab in am and 2 at bed time 120 tablet 2  . desonide (DESOWEN) 0.05 % cream Apply 1 application topically daily as needed (dry skin).    Marland Kitchen ketoconazole (NIZORAL) 2 % shampoo Apply 1 application topically 2 (two) times a week. 120 mL 0  . levothyroxine (SYNTHROID, LEVOTHROID) 100 MCG tablet Take 1 tablet (100 mcg total) by mouth daily. 90 tablet 3  . mupirocin ointment (BACTROBAN) 2 % Please specify directions, refills and quantity 1 g 0  . perphenazine (TRILAFON) 8 MG tablet Take 3 tablets (24 mg total) by mouth at bedtime. 270 tablet 0  . phenelzine (NARDIL) 15 MG tablet Take 3 tablets (45 mg total) by mouth 2 (two) times daily. 540 tablet 0  . silver sulfADIAZINE (SILVADENE) 1 % cream Apply 1 application topically daily as needed (for rash). 50 g 2  . triamcinolone  ointment (KENALOG) 0.5 % Apply 1 application topically 2 (two) times daily. For moderate to severe eczema.  Do not use for more than 1 week at a time. 60 g 3   No current facility-administered medications on file prior to visit.    Past Medical History:  Diagnosis Date  . A-fib ([place])   . Arrhythmia   . Depression   . Depression   . Hypertension   . Low back pain   . Morbid obesity, BMI unknown ([place])   . Osteoarthritis    oa in bilateral knees  . Seizures ([place])   . Thyroid disease   . Varicose veins    Vitals:   04/22/18 1014  BP: 110/80  Pulse: 87  Temp: 98.3 F (36.8 C)  TempSrc: Oral  SpO2: 95%  Weight: 270 lb (122.5 kg)      Review of Systems  Constitutional: Negative for fever.  Eyes: Negative for pain.  Respiratory: Positive for cough. Negative for shortness of breath.   Cardiovascular: Negative.   Gastrointestinal: Negative.   Genitourinary: Negative.   Skin: Positive for rash.  All other systems reviewed and are negative.      Objective:   Physical Exam  Constitutional: He appears well-developed. No distress.  Cardiovascular: Normal rate, normal heart sounds and intact distal pulses.  No murmur heard. Pulmonary/Chest: Effort normal and breath sounds normal. No stridor.  No respiratory distress. He has no wheezes.  Skin: Rash noted.     Nursing note and vitals reviewed.        Assessment:     Rosacea Seborrheic dermatitis    Plan:     Start Metrogel and topical desonide. F/U soon with PCP if no improvement.

## 2018-04-22 NOTE — Patient Instructions (Signed)
Rosacea Rosacea is a long-term (chronic) condition that affects the skin of the face, including the cheeks, nose, brow, and chin. This condition can also affect the eyes. Rosacea causes blood vessels near the surface of the skin to get bigger (be enlarged), and that makes the skin red. There is no cure for this condition, but treatment can help to control your symptoms. Follow these instructions at home: Skin Care Take care of your skin as told by your doctor. Your doctor may tell you do these things:  Wash your skin gently two or more times each day.  Use mild soap.  Use a sunscreen or sunblock with SPF 30 or greater.  Use gentle cosmetics that are meant for sensitive skin.  Shave with an electric shaver instead of a blade.  Lifestyle  Try to keep track of what foods trigger this condition. Avoid any triggers. These may include: ? Spicy foods. ? Seafood. ? Cheese. ? Hot liquids. ? Nuts. ? Chocolate. ? Iodized salt.  Do not drink alcohol.  Avoid extremely cold or hot temperatures.  Try to reduce your stress. If you need help to do this, talk with your doctor.  When you exercise, do these things to stay cool: ? Limit your sun exposure. ? Use a fan. ? Exercise for a shorter time, and exercise more often. General instructions  Keep all follow-up visits as told by your doctor. This is important.  Take over-the-counter and prescription medicines only as told by your doctor.  If your eyelids are affected, press warm compresses to them. Do this as told by your doctor.  If you were prescribed an antibiotic medicine, apply or take it as told by your doctor. Do not stop using the antibiotic even if your condition improves. Contact a doctor if:  Your symptoms get worse.  Your symptoms do not improve after two months of treatment.  You have new symptoms.  You have any changes in vision or you have problems with your eyes, such as redness or itching.  You feel very sad  (depressed).  You lose your appetite.  You have trouble concentrating. This information is not intended to replace advice given to you by your health care provider. Make sure you discuss any questions you have with your health care provider. Document Released: 09/14/2011 Document Revised: 11/28/2015 Document Reviewed: 08/29/2014 Elsevier Interactive Patient Education  [name].

## 2018-04-24 ENCOUNTER — Other Ambulatory Visit: Payer: Self-pay | Admitting: Family Medicine

## 2018-04-25 ENCOUNTER — Other Ambulatory Visit (HOSPITAL_COMMUNITY): Payer: Self-pay | Admitting: Psychiatry

## 2018-04-25 DIAGNOSIS — F330 Major depressive disorder, recurrent, mild: Secondary | ICD-10-CM

## 2018-04-25 MED ORDER — APIXABAN 5 MG PO TABS: 5.0000 mg | ORAL_TABLET | Freq: Two times a day (BID) | ORAL | 11 refills | Status: DC

## 2018-04-26 NOTE — Telephone Encounter (Signed)
This Rx was prescribed by Dr. Gwendlyn Deutscher, can this be sent to her? I'm not sure what the next best option would be.

## 2018-04-27 ENCOUNTER — Other Ambulatory Visit (HOSPITAL_COMMUNITY): Payer: Self-pay | Admitting: Psychiatry

## 2018-04-27 DIAGNOSIS — F330 Major depressive disorder, recurrent, mild: Secondary | ICD-10-CM

## 2018-05-02 ENCOUNTER — Other Ambulatory Visit (HOSPITAL_COMMUNITY): Payer: Self-pay | Admitting: Psychiatry

## 2018-05-02 DIAGNOSIS — F330 Major depressive disorder, recurrent, mild: Secondary | ICD-10-CM

## 2018-05-03 ENCOUNTER — Other Ambulatory Visit (HOSPITAL_COMMUNITY): Payer: Self-pay

## 2018-05-03 ENCOUNTER — Telehealth (HOSPITAL_COMMUNITY): Payer: Self-pay

## 2018-05-03 DIAGNOSIS — F330 Major depressive disorder, recurrent, mild: Secondary | ICD-10-CM

## 2018-05-03 MED ORDER — PERPHENAZINE 8 MG PO TABS: 24.0000 mg | ORAL_TABLET | Freq: Every day | ORAL | 0 refills | Status: DC

## 2018-05-03 NOTE — Telephone Encounter (Signed)
Patient called requested a refill on Trilafon 8mg  tabs. Next appointment is 06-20-18.

## 2018-05-03 NOTE — Telephone Encounter (Signed)
Trlafon is already called by [employer].  Please inform patient to pick up the medication.

## 2018-05-03 NOTE — Telephone Encounter (Signed)
Called left voicemail for patient to go pick up medication

## 2018-05-06 ENCOUNTER — Other Ambulatory Visit (HOSPITAL_COMMUNITY): Payer: Self-pay | Admitting: Psychiatry

## 2018-05-06 DIAGNOSIS — F330 Major depressive disorder, recurrent, mild: Secondary | ICD-10-CM

## 2018-05-06 DIAGNOSIS — F251 Schizoaffective disorder, depressive type: Secondary | ICD-10-CM

## 2018-05-28 ENCOUNTER — Other Ambulatory Visit (HOSPITAL_COMMUNITY): Payer: Self-pay | Admitting: Psychiatry

## 2018-05-28 DIAGNOSIS — F330 Major depressive disorder, recurrent, mild: Secondary | ICD-10-CM

## 2018-05-31 ENCOUNTER — Other Ambulatory Visit (HOSPITAL_COMMUNITY): Payer: Self-pay | Admitting: Psychiatry

## 2018-05-31 DIAGNOSIS — F330 Major depressive disorder, recurrent, mild: Secondary | ICD-10-CM

## 2018-05-31 MED ORDER — PHENELZINE SULFATE 15 MG PO TABS: 45.0000 mg | ORAL_TABLET | Freq: Two times a day (BID) | ORAL | 0 refills | Status: DC

## 2018-06-01 ENCOUNTER — Other Ambulatory Visit (HOSPITAL_COMMUNITY): Payer: Self-pay | Admitting: Psychiatry

## 2018-06-01 DIAGNOSIS — F251 Schizoaffective disorder, depressive type: Secondary | ICD-10-CM

## 2018-06-01 DIAGNOSIS — F330 Major depressive disorder, recurrent, mild: Secondary | ICD-10-CM

## 2018-06-09 ENCOUNTER — Other Ambulatory Visit (HOSPITAL_COMMUNITY): Payer: Self-pay | Admitting: Psychiatry

## 2018-06-09 DIAGNOSIS — F330 Major depressive disorder, recurrent, mild: Secondary | ICD-10-CM

## 2018-06-20 ENCOUNTER — Encounter (HOSPITAL_COMMUNITY): Payer: Self-pay | Admitting: Psychiatry

## 2018-06-20 ENCOUNTER — Ambulatory Visit (INDEPENDENT_AMBULATORY_CARE_PROVIDER_SITE_OTHER): Payer: Medicare Other | Admitting: Psychiatry

## 2018-06-20 DIAGNOSIS — F251 Schizoaffective disorder, depressive type: Secondary | ICD-10-CM

## 2018-06-20 DIAGNOSIS — F330 Major depressive disorder, recurrent, mild: Secondary | ICD-10-CM | POA: Diagnosis not present

## 2018-06-20 MED ORDER — DEPAKOTE ER 250 MG PO TB24: ORAL_TABLET | ORAL | 2 refills | Status: DC

## 2018-06-20 MED ORDER — PHENELZINE SULFATE 15 MG PO TABS: 45.0000 mg | ORAL_TABLET | Freq: Two times a day (BID) | ORAL | 0 refills | Status: DC

## 2018-06-20 MED ORDER — PERPHENAZINE 8 MG PO TABS: ORAL_TABLET | ORAL | 0 refills | Status: DC

## 2018-06-20 MED ORDER — BENZTROPINE MESYLATE 1 MG PO TABS: 1.0000 mg | ORAL_TABLET | Freq: Every day | ORAL | 0 refills | Status: DC

## 2018-06-20 NOTE — Progress Notes (Signed)
BH MD/PA/NP OP Progress Note  06/20/2018 3:36 PM [name]  MRN:  [id number]  Chief Complaint: I am doing fine.  I had a good Thanksgiving.  HPI: Patient came for his follow-up appointment.  He is taking his medication as prescribed.  He had a good Thanksgiving.  He went to see his mother who lives an hour and a half away.  He is hoping to see his brother on Christmas.  He is going to visit his mother again and his brother will also come with his family.  Overall he describes his mood is good.  He denies any irritability, anger, mania or any psychosis.  He is currently not in any relationship and he does not want because previously history of did not went very well.  He is sleeping good.  He denies any crying spells or any feeling of hopelessness or worthlessness.  He has mild tremors however it does not interfere in his daily routine.  His energy level is good.  He is not interested in therapy.  I reviewed last blood work.  His last hemoglobin A1c was normal and Depakote level 72.  Visit Diagnosis:    ICD-10-CM   1. Major depressive disorder, recurrent episode, mild (HCC) F33.0 phenelzine (NARDIL) 15 MG tablet    perphenazine (TRILAFON) 8 MG tablet    DEPAKOTE ER 250 MG 24 hr tablet    benztropine (COGENTIN) 1 MG tablet  2. Schizoaffective disorder, depressive type (HCC) F25.1 benztropine (COGENTIN) 1 MG tablet    Past Psychiatric History: Reviewed. History of multiple hospitalization from 1979-1985 due to severe depression, suicidal thoughts.  No history of suicidal attempt.  Diagnosed with OCD, schizoaffective disorder and major depression.  Tried numerous medication including TCAs.  We tried Lamictal but he develop a rash.  Past Medical History:  Past Medical History:  Diagnosis Date  . A-fib ([place])   . Arrhythmia   . Depression   . Depression   . Hypertension   . Low back pain   . Morbid obesity, BMI unknown ([place])   . Osteoarthritis    oa in bilateral knees  . Seizures  ([place])   . Thyroid disease   . Varicose veins     Past Surgical History:  Procedure Laterality Date  . ACHILLES TENDON REPAIR  approx. 2004   right foot  . CATARACT EXTRACTION     bilateral  . stab phlebectomy  Right 11/09/2016   stab phlebectomy R leg by Tinnie Gens MD  . TOOTH EXTRACTION      Family Psychiatric History: Reviewed.  Family History:  Family History  Problem Relation Age of Onset  . Diabetes Father   . Heart disease Father   . Diabetes Brother   . OCD Mother     Social History:  Social History   Socioeconomic History  . Marital status: Single    Spouse name: Not on file  . Number of children: Not on file  . Years of education: Not on file  . Highest education level: Not on file  Occupational History  . Occupation: disabled  Social Needs  . Financial resource strain: Not on file  . Food insecurity:    Worry: Not on file    Inability: Not on file  . Transportation needs:    Medical: Not on file    Non-medical: Not on file  Tobacco Use  . Smoking status: Never Smoker  . Smokeless tobacco: Never Used  Substance and Sexual Activity  . Alcohol use:  No    Alcohol/week: 0.0 standard drinks  . Drug use: No  . Sexual activity: Never  Lifestyle  . Physical activity:    Days per week: Not on file    Minutes per session: Not on file  . Stress: Not on file  Relationships  . Social connections:    Talks on phone: Not on file    Gets together: Not on file    Attends religious service: Not on file    Active member of club or organization: Not on file    Attends meetings of clubs or organizations: Not on file    Relationship status: Not on file  Other Topics Concern  . Not on file  Social History Narrative  . Not on file    Allergies:  Allergies  Allergen Reactions  . Lamictal [Lamotrigine] Rash  . Metformin And Related Rash    Metabolic Disorder Labs: Lab Results  Component Value Date   HGBA1C 5.7 02/23/2018   MPG 146 (H) 04/24/2015    MPG 140 02/05/2007   No results found for: PROLACTIN Lab Results  Component Value Date   CHOL 206 (H) 07/16/2016   TRIG 299 (H) 07/16/2016   HDL 24 (L) 07/16/2016   CHOLHDL 8.6 (H) 07/16/2016   VLDL 60 (H) 07/16/2016   LDLCALC 122 (H) 07/16/2016   LDLCALC 112 04/30/2015   Lab Results  Component Value Date   TSH 1.23 12/23/2015   TSH 2.076 04/30/2015    Therapeutic Level Labs: No results found for: LITHIUM Lab Results  Component Value Date   VALPROATE 72 08/17/2017   VALPROATE 46 (L) 12/07/2016   No components found for:  CBMZ  Current Medications: Current Outpatient Medications  Medication Sig Dispense Refill  . allopurinol (ZYLOPRIM) 300 MG tablet Take 1 tablet (300 mg total) by mouth daily. 90 tablet 3  . apixaban (ELIQUIS) 5 MG TABS tablet Take 1 tablet (5 mg total) by mouth 2 (two) times daily. 60 tablet 11  . benztropine (COGENTIN) 1 MG tablet TAKE 1 TABLET BY MOUTH EVERY DAY 30 tablet 1  . DEPAKOTE ER 250 MG 24 hr tablet Take 2 tab in am and 2 at bed time 120 tablet 2  . ketoconazole (NIZORAL) 2 % shampoo Apply 1 application topically 2 (two) times a week. 120 mL 0  . levothyroxine (SYNTHROID, LEVOTHROID) 100 MCG tablet Take 1 tablet (100 mcg total) by mouth daily. 90 tablet 3  . metroNIDAZOLE (METROGEL) 1 % gel Apply topically daily. 45 g 0  . mupirocin ointment (BACTROBAN) 2 % Please specify directions, refills and quantity 1 g 0  . perphenazine (TRILAFON) 8 MG tablet TAKE 3 TABLETS BY MOUTH EVERY DAY AT BEDTIME 270 tablet 0  . perphenazine (TRILAFON) 8 MG tablet Take 3 tablets (24 mg total) by mouth at bedtime. 270 tablet 0  . phenelzine (NARDIL) 15 MG tablet Take 3 tablets (45 mg total) by mouth 2 (two) times daily. 540 tablet 0  . silver sulfADIAZINE (SILVADENE) 1 % cream Apply 1 application topically daily as needed (for rash). 50 g 2  . triamcinolone ointment (KENALOG) 0.5 % Apply 1 application topically 2 (two) times daily. For moderate to severe eczema.  Do  not use for more than 1 week at a time. 60 g 3   No current facility-administered medications for this visit.      Musculoskeletal: Strength & Muscle Tone: within normal limits Gait & Station: normal Patient leans: N/A  Psychiatric Specialty Exam: ROS  Blood  pressure 126/80, pulse 82, height 5\' 11"  (1.803 m), weight 270 lb (122.5 kg), SpO2 94 %.Body mass index is 37.66 kg/m.  General Appearance: Casual  Eye Contact:  Good  Speech:  Clear and Coherent  Volume:  Normal  Mood:  Euthymic  Affect:  Congruent  Thought Process:  Goal Directed  Orientation:  Full (Time, Place, and Person)  Thought Content: Logical   Suicidal Thoughts:  No  Homicidal Thoughts:  No  Memory:  Immediate;   Good Recent;   Good Remote;   Good  Judgement:  Good  Insight:  Good  Psychomotor Activity:  mild tremors in hand  Concentration:  Concentration: Fair and Attention Span: Fair  Recall:  Good  Fund of Knowledge: Good  Language: Good  Akathisia:  No  Handed:  Right  AIMS (if indicated): not done  Assets:  Communication Skills Desire for Improvement Housing Resilience  ADL's:  Intact  Cognition: WNL  Sleep:  Good   Screenings: PHQ2-9     Office Visit from 04/22/2018 in [place] Office Visit from 02/23/2018 in [place] Office Visit from 11/15/2017 in [place] Office Visit from 04/15/2017 in [place] Office Visit from 12/23/2015 in [place]  PHQ-2 Total Score  0  0  0  0  0       Assessment and Plan: Major depressive disorder, recurrent.  Schizoaffective disorder depressed type.  Patient is a stable on his current medication.  He has mild tremors in his hand but otherwise he is tolerating his medication very well.  He has been taking Trilafon and Nardil for more than 20 years.  He understand very well MOI inhibitors and dietary restrictions.  He is not interested in  therapy.  I will continue Nardil 45 mg twice a day, Trilafon 24 mg at bedtime, Cogentin 1 mg daily and Depakote 500 mg twice a day.  Recommended to call us back if he has any question or any concern.  Follow-up in 3 months.     Kathlee Nations, MD 06/20/2018, 3:36 PM

## 2018-07-15 DIAGNOSIS — S52532A Colles' fracture of left radius, initial encounter for closed fracture: Secondary | ICD-10-CM | POA: Diagnosis not present

## 2018-07-15 DIAGNOSIS — R0902 Hypoxemia: Secondary | ICD-10-CM | POA: Diagnosis not present

## 2018-07-15 DIAGNOSIS — I4891 Unspecified atrial fibrillation: Secondary | ICD-10-CM | POA: Diagnosis not present

## 2018-07-15 DIAGNOSIS — W19XXXA Unspecified fall, initial encounter: Secondary | ICD-10-CM | POA: Diagnosis not present

## 2018-07-15 DIAGNOSIS — I10 Essential (primary) hypertension: Secondary | ICD-10-CM | POA: Diagnosis not present

## 2018-07-15 DIAGNOSIS — R52 Pain, unspecified: Secondary | ICD-10-CM | POA: Diagnosis not present

## 2018-07-15 DIAGNOSIS — S6992XA Unspecified injury of left wrist, hand and finger(s), initial encounter: Secondary | ICD-10-CM | POA: Diagnosis not present

## 2018-07-16 ENCOUNTER — Other Ambulatory Visit: Payer: Self-pay

## 2018-07-16 ENCOUNTER — Encounter (HOSPITAL_COMMUNITY): Payer: Self-pay

## 2018-07-16 ENCOUNTER — Emergency Department (HOSPITAL_COMMUNITY): Payer: Medicare Other

## 2018-07-16 ENCOUNTER — Emergency Department (HOSPITAL_COMMUNITY)
Admission: EM | Admit: 2018-07-16 | Discharge: 2018-07-16 | Disposition: A | Discharge status: Home / Self Care | Payer: Medicare Other | Attending: Emergency Medicine | Admitting: Emergency Medicine

## 2018-07-16 DIAGNOSIS — Z7901 Long term (current) use of anticoagulants: Secondary | ICD-10-CM | POA: Insufficient documentation

## 2018-07-16 DIAGNOSIS — S0990XA Unspecified injury of head, initial encounter: Secondary | ICD-10-CM | POA: Diagnosis not present

## 2018-07-16 DIAGNOSIS — I10 Essential (primary) hypertension: Secondary | ICD-10-CM | POA: Diagnosis not present

## 2018-07-16 DIAGNOSIS — E079 Disorder of thyroid, unspecified: Secondary | ICD-10-CM | POA: Insufficient documentation

## 2018-07-16 DIAGNOSIS — Y929 Unspecified place or not applicable: Secondary | ICD-10-CM | POA: Diagnosis not present

## 2018-07-16 DIAGNOSIS — Y939 Activity, unspecified: Secondary | ICD-10-CM | POA: Diagnosis not present

## 2018-07-16 DIAGNOSIS — R51 Headache: Secondary | ICD-10-CM | POA: Diagnosis not present

## 2018-07-16 DIAGNOSIS — I4891 Unspecified atrial fibrillation: Secondary | ICD-10-CM | POA: Insufficient documentation

## 2018-07-16 DIAGNOSIS — Y999 Unspecified external cause status: Secondary | ICD-10-CM | POA: Diagnosis not present

## 2018-07-16 DIAGNOSIS — Z79899 Other long term (current) drug therapy: Secondary | ICD-10-CM | POA: Diagnosis not present

## 2018-07-16 DIAGNOSIS — S52532A Colles' fracture of left radius, initial encounter for closed fracture: Secondary | ICD-10-CM | POA: Diagnosis not present

## 2018-07-16 DIAGNOSIS — W109XXA Fall (on) (from) unspecified stairs and steps, initial encounter: Secondary | ICD-10-CM | POA: Diagnosis not present

## 2018-07-16 DIAGNOSIS — S52592A Other fractures of lower end of left radius, initial encounter for closed fracture: Secondary | ICD-10-CM | POA: Diagnosis not present

## 2018-07-16 DIAGNOSIS — S59912A Unspecified injury of left forearm, initial encounter: Secondary | ICD-10-CM | POA: Diagnosis present

## 2018-07-16 LAB — CBG MONITORING, ED: Glucose-Capillary: 162 mg/dL — ABNORMAL HIGH (ref 70–99)

## 2018-07-16 MED ORDER — ACETAMINOPHEN 500 MG PO TABS
1000.0000 mg | ORAL_TABLET | Freq: Once | ORAL | Status: AC
  Administered 2018-07-16: 1000 mg via ORAL
  Filled 2018-07-16: qty 2

## 2018-07-16 MED ORDER — OXYCODONE HCL 5 MG PO TABS
5.0000 mg | ORAL_TABLET | Freq: Once | ORAL | Status: AC
  Administered 2018-07-16: 5 mg via ORAL
  Filled 2018-07-16: qty 1

## 2018-07-16 MED ORDER — MORPHINE SULFATE 15 MG PO TABS: 15.0000 mg | ORAL_TABLET | ORAL | 0 refills | Status: DC | PRN

## 2018-07-16 NOTE — ED Provider Notes (Signed)
[facility] EMERGENCY DEPARTMENT Provider Note   CSN: [id number] Arrival date & time: 07/16/18  0001     History   Chief Complaint Chief Complaint  Patient presents with  . Fall    HPI [name] is a 58 y.o. male.  58 yo M with a chief complaint of a fall.  The patient was going up a flight of stairs and lost his balance and fell backwards down about 2-3 stairs.  Struck the back of his head on the ground and also landed on an outstretched left arm.  Complaining of pain to the left wrist and having mild pain to the back of the head.  He otherwise has mild aches and pains though is mostly concerned about his left wrist.  Denies chest pain abdominal pain neck pain.  Denies difficulty breathing.  Was ambulatory after the event.  The history is provided by the patient.  Fall  This is a new problem. The current episode started yesterday. The problem occurs constantly. The problem has not changed since onset.Associated symptoms include headaches. Pertinent negatives include no chest pain, no abdominal pain and no shortness of breath. Nothing aggravates the symptoms. Nothing relieves the symptoms. He has tried nothing for the symptoms. The treatment provided no relief.    Past Medical History:  Diagnosis Date  . A-fib ([place])   . Arrhythmia   . Depression   . Depression   . Hypertension   . Low back pain   . Morbid obesity, BMI unknown ([place])   . Osteoarthritis    oa in bilateral knees  . Seizures ([place])   . Thyroid disease   . Varicose veins     Patient Active Problem List   Diagnosis Date Noted  . Intertrigo 04/08/2018  . Schizoaffective disorder, depressive type ([place]) 04/08/2018  . Sunburn 11/15/2017  . Cough 10/20/2017  . Left foot pain 04/16/2017  . Contact dermatitis 04/16/2017  . Varicose veins of bilateral lower extremities with other complications 87/68/1157  . Erectile dysfunction 04/26/2015  . Hypothyroidism   . Seizures ([place])   .  Depression   . Persistent atrial fibrillation   . Obesity, Class III, BMI 40-49.9 (morbid obesity) ([place])   . T2DM (type 2 diabetes mellitus) ([place]) 02/10/2007    Past Surgical History:  Procedure Laterality Date  . ACHILLES TENDON REPAIR  approx. 2004   right foot  . CATARACT EXTRACTION     bilateral  . stab phlebectomy  Right 11/09/2016   stab phlebectomy R leg by Tinnie Gens MD  . [name] Medications    Prior to Admission medications   Medication Sig Start Date End Date Taking? Authorizing Provider  allopurinol (ZYLOPRIM) 300 MG tablet Take 1 tablet (300 mg total) by mouth daily. 03/11/18  Yes Matilde Haymaker, MD  apixaban (ELIQUIS) 5 MG TABS tablet Take 1 tablet (5 mg total) by mouth 2 (two) times daily. 04/25/18  Yes Matilde Haymaker, MD  benztropine (COGENTIN) 1 MG tablet Take 1 tablet (1 mg total) by mouth daily. 06/20/18  Yes Arfeen, Arlyce Harman, MD  DEPAKOTE ER 250 MG 24 hr tablet Take 2 tab in am and 2 at bed time Patient taking differently: Take 500 mg by mouth 2 (two) times daily.  06/20/18  Yes Arfeen, Arlyce Harman, MD  levothyroxine (SYNTHROID, LEVOTHROID) 100 MCG tablet Take 1 tablet (100 mcg total) by mouth daily. 12/29/17  Yes Mayo, Pete Pelt, MD  perphenazine (TRILAFON) 8 MG tablet TAKE 3 TABLETS BY MOUTH EVERY DAY AT BEDTIME Patient taking differently: Take 24 mg by mouth at bedtime.  06/20/18  Yes Arfeen, Arlyce Harman, MD  phenelzine (NARDIL) 15 MG tablet Take 3 tablets (45 mg total) by mouth 2 (two) times daily. 06/20/18  Yes Arfeen, Arlyce Harman, MD  ketoconazole (NIZORAL) 2 % shampoo Apply 1 application topically 2 (two) times a week. Patient not taking: Reported on 07/16/2018 12/23/15   Vivi Barrack, MD  metroNIDAZOLE (METROGEL) 1 % gel Apply topically daily. Patient not taking: Reported on 07/16/2018 04/22/18   Kinnie Feil, MD  morphine (MSIR) 15 MG tablet Take 1 tablet (15 mg total) by mouth every 4 (four) hours as needed for severe pain. 07/16/18   Deno Etienne, DO  silver sulfADIAZINE (SILVADENE) 1 % cream Apply 1 application topically daily as needed (for rash). Patient not taking: Reported on 07/16/2018 06/11/17   Mayo, Pete Pelt, MD  triamcinolone ointment (KENALOG) 0.5 % Apply 1 application topically 2 (two) times daily. For moderate to severe eczema.  Do not use for more than 1 week at a time. Patient not taking: Reported on 07/16/2018 04/15/17   Mayo, Pete Pelt, MD    Family History Family History  Problem Relation Age of Onset  . Diabetes Father   . Heart disease Father   . Diabetes Brother   . OCD Mother     Social History Social History   Tobacco Use  . Smoking status: Never Smoker  . Smokeless tobacco: Never Used  Substance Use Topics  . Alcohol use: No    Alcohol/week: 0.0 standard drinks  . Drug use: No     Allergies   Lamictal [lamotrigine] and Metformin and related   Review of Systems Review of Systems  Constitutional: Negative for chills and fever.  HENT: Negative for congestion and facial swelling.   Eyes: Negative for discharge and visual disturbance.  Respiratory: Negative for shortness of breath.   Cardiovascular: Negative for chest pain and palpitations.  Gastrointestinal: Negative for abdominal pain, diarrhea and vomiting.  Musculoskeletal: Positive for arthralgias. Negative for myalgias.  Skin: Negative for color change and rash.  Neurological: Positive for headaches. Negative for tremors and syncope.  Psychiatric/Behavioral: Negative for confusion and dysphoric mood.     Physical Exam Updated Vital Signs BP 140/90   Pulse 93   Temp 98.4 F (36.9 C) (Oral)   Resp 17   Ht 5\' 9"  (1.753 m)   Wt 122.5 kg   SpO2 100%   BMI 39.87 kg/m   Physical Exam Vitals signs and nursing note reviewed.  Constitutional:      Appearance: He is well-developed.  HENT:     Head: Normocephalic and atraumatic.  Eyes:     Pupils: Pupils are equal, round, and reactive to light.  Neck:     Musculoskeletal:  Normal range of motion and neck supple.     Vascular: No JVD.  Cardiovascular:     Rate and Rhythm: Normal rate and regular rhythm.     Heart sounds: No murmur. No friction rub. No gallop.   Pulmonary:     Effort: No respiratory distress.     Breath sounds: No wheezing.  Abdominal:     General: There is no distension.     Tenderness: There is no guarding or rebound.  Musculoskeletal: Normal range of motion.        General: Tenderness and deformity present.     Comments: Left  wrist edema.  PMS intact distally.   Skin:    Coloration: Skin is not pale.     Findings: No rash.  Neurological:     Mental Status: He is alert and oriented to person, place, and time.  Psychiatric:        Behavior: Behavior normal.      ED Treatments / Results  Labs (all labs ordered are listed, but only abnormal results are displayed) Labs Reviewed  CBG MONITORING, ED - Abnormal; Notable for the following components:      Result Value   Glucose-Capillary 162 (*)    All other components within normal limits    EKG EKG Interpretation  Date/Time:  Saturday July 16 2018 00:01:47 EST Ventricular Rate:  90 PR Interval:    QRS Duration: 74 QT Interval:  371 QTC Calculation: 420 R Axis:   4 Text Interpretation:  Atrial fibrillation Low voltage, extremity leads Minimal ST depression, inferior leads No significant change since last tracing Confirmed by Deno Etienne [phone]) on 07/16/2018 12:18:16 AM   Radiology Dg Wrist Complete Left  Result Date: 07/16/2018 CLINICAL DATA:  Left wrist pain EXAM: LEFT WRIST - COMPLETE 3+ VIEW COMPARISON:  None. FINDINGS: Acute displaced ulnar styloid process fracture. Acute comminuted intra-articular distal radius fracture with impaction and 1/4 bone with dorsal displacement of distal fracture fragment. Diffuse soft tissue swelling. Widened scapholunate interval consistent with ligamentous injury IMPRESSION: 1. Acute comminuted, impacted, and dorsally displaced  intra-articular distal radius fracture 2. Acute displaced ulnar styloid process fracture 3. Widened appearance of the scapholunate interval suspicious for ligamentous injury Electronically Signed   By: Donavan Foil M.D.   On: 07/16/2018 01:23   Ct Head Wo Contrast  Result Date: 07/16/2018 CLINICAL DATA:  Head trauma EXAM: CT HEAD WITHOUT CONTRAST TECHNIQUE: Contiguous axial images were obtained from the base of the skull through the vertex without intravenous contrast. COMPARISON:  CT 01/07/2018 FINDINGS: Brain: No acute territorial infarction, hemorrhage, or intracranial mass. Moderate atrophy. Mild small vessel ischemic changes of the white matter. Stable ventricle size. Vascular: No hyperdense vessels.  No unexpected calcification Skull: Normal. Negative for fracture or focal lesion. Sinuses/Orbits: No acute finding. Other: None IMPRESSION: 1. No CT evidence for acute intracranial abnormality. 2. Atrophy and mild small vessel ischemic changes of the white matter Electronically Signed   By: Donavan Foil M.D.   On: 07/16/2018 01:26    Procedures Procedures (including critical care time)  Medications Ordered in ED Medications  acetaminophen (TYLENOL) tablet 1,000 mg (1,000 mg Oral Given 07/16/18 0125)  oxyCODONE (Oxy IR/ROXICODONE) immediate release tablet 5 mg (5 mg Oral Given 07/16/18 0125)     Initial Impression / Assessment and Plan / ED Course  I have reviewed the triage vital signs and the nursing notes.  Pertinent labs & imaging results that were available during my care of the patient were reviewed by me and considered in my medical decision making (see chart for details).     58  Yo M with a chief complaints of a fall.  Sounds mechanical by history.  Patient complaining of left wrist pain.  Patient also struck his head is on Eliquis.  Will obtain a CT of the head.  Plain film of the left wrist.  Patient has a distal radius fracture on my view of the xray.  Placed in sugar tong.   Hand follow-up.   5:47 AM:  I have discussed the diagnosis/risks/treatment options with the patient and believe the pt to be eligible  for discharge home to follow-up with Hand. We also discussed returning to the ED immediately if new or worsening sx occur. We discussed the sx which are most concerning (e.g., sudden worsening pain, fever, inability to tolerate by mouth) that necessitate immediate return. Medications administered to the patient during their visit and any new prescriptions provided to the patient are listed below.  Medications given during this visit Medications  acetaminophen (TYLENOL) tablet 1,000 mg (1,000 mg Oral Given 07/16/18 0125)  oxyCODONE (Oxy IR/ROXICODONE) immediate release tablet 5 mg (5 mg Oral Given 07/16/18 0125)      The patient appears reasonably screen and/or stabilized for discharge and I doubt any other medical condition or other [facility] requiring further screening, evaluation, or treatment in the ED at this time prior to discharge.     Final Clinical Impressions(s) / ED Diagnoses   Final diagnoses:  Closed Colles' fracture of left radius, initial encounter    ED Discharge Orders         Ordered    morphine (MSIR) 15 MG tablet  Every 4 hours PRN     07/16/18 [place], [place], DO 07/16/18 [phone]

## 2018-07-16 NOTE — ED Triage Notes (Addendum)
Pt arrived via [facility]; pt from [facility] with c/o fall outside while attempting to bend over; pt was reported to have lost balance and fell bkwards hitting head on concrete per pt; pt has obvious deformity to L wrist with attempts to break fall. Pt is on blood thinner, Eliquis and has hx of Afib, Depression, Hypothyroid, Gout. Pt rec'd 178mcg of Fentanyl ; 154/96, 76, 94% on RA, no CBG

## 2018-07-16 NOTE — ED Notes (Signed)
Ortho tech re-paged for splint application and finger traction

## 2018-07-16 NOTE — ED Notes (Signed)
Pt discharged from ED; instructions provided and scripts given; Pt encouraged to return to ED if symptoms worsen and to f/u with PCP; Pt verbalized understanding of all instructions 

## 2018-07-16 NOTE — Progress Notes (Signed)
Orthopedic Tech Progress Note Patient Details:  [name] [birth year] [id number]  Ortho Devices Type of Ortho Device: Ace wrap, Sugartong splint, Sling immobilizer Ortho Device/Splint Interventions: Application   Post Interventions Patient Tolerated: Well Instructions Provided: Care of device   [place] Pink 07/16/2018, 5:31 AM

## 2018-07-16 NOTE — Discharge Instructions (Signed)

## 2018-07-16 NOTE — ED Notes (Signed)
Ortho tech paged and call returned; awaiting tech to apply pt splint and finger traction

## 2018-07-18 ENCOUNTER — Other Ambulatory Visit: Payer: Self-pay | Admitting: Orthopedic Surgery

## 2018-07-18 ENCOUNTER — Telehealth: Payer: Self-pay

## 2018-07-18 DIAGNOSIS — S52572A Other intraarticular fracture of lower end of left radius, initial encounter for closed fracture: Secondary | ICD-10-CM | POA: Diagnosis not present

## 2018-07-18 NOTE — Telephone Encounter (Signed)
I made multiple attempts to contact this pt or his mother without success. This is a good question but ultimately I would feel more comfortable talking to his surgeon about this as the surgeon is more familiar with the procedure and can help weight the risk/benefit of anticoagulation. His surgeon might make the decision about anticoagulation without my input, which would be ok.  But I would prefer not to weigh in without contacting the surgeon.  Please call [name] and explain what I have written above and ask if he has discussed this with his surgeon.  If he has, please follow his surgeon's recommendation.  Matilde Haymaker, MD

## 2018-07-18 NOTE — Telephone Encounter (Signed)
Patient having surgery this Thursday on hand fracture. Should he stop his Eliquis and if so, when?  Call back is [phone]  Danley Danker, RN [facility])

## 2018-07-18 NOTE — Progress Notes (Signed)
Patient sees resident at [facility] for A-fib-pt on on Eliquis and having wrist fracture surgery 1216-20 with Dr Fredna Dow. Need instructions if OK to hold for surgery and how long. Clinic RN at [facility] states she will contact Dr Matilde Haymaker for instructions.

## 2018-07-19 NOTE — Telephone Encounter (Signed)
Sula Soda RN for Dr. Fredna Dow at [facility] is calling and would like for Dr. Pilar Plate to call her concerning this patient and his medication management.   The best call back number is [phone]. Sula Soda said to ask to speak with her and they will direct the call.

## 2018-07-19 NOTE — Telephone Encounter (Signed)
I called and spoke with Sula Soda, RN and advised that 2 days of cessation of anticoagulation would be appropriate as this will be an involved surgery.  This will require moving the surgery to Friday(1/17).  Sula Soda was given my cell phone number to call back if there are additional concerns.  Matilde Haymaker, MD

## 2018-07-19 NOTE — Telephone Encounter (Signed)
Will forward to MD. Jago Carton,CMA  

## 2018-07-19 NOTE — Telephone Encounter (Signed)
Jeani Hawking calls back, pt appt is on Thursday and at this point they may need to reschedule.  Per Jeani Hawking, Dr. Fredna Dow is willing to speak with Dr. Pilar Plate.   Dr. Pilar Plate was paged and will give Dr. Fredna Dow / lynn a call today. Pearly Apachito, Salome Spotted, CMA

## 2018-07-20 ENCOUNTER — Encounter (HOSPITAL_BASED_OUTPATIENT_CLINIC_OR_DEPARTMENT_OTHER): Payer: Self-pay | Admitting: *Deleted

## 2018-07-20 NOTE — Progress Notes (Signed)
Patient with hx chronic a-fib and is on Eliquis. Patient's last dose was 07-18-18, OK with Dr Matilde Haymaker, resident with [facility].

## 2018-07-21 ENCOUNTER — Encounter (HOSPITAL_BASED_OUTPATIENT_CLINIC_OR_DEPARTMENT_OTHER)
Admission: RE | Disposition: A | Discharge status: Home / Self Care | Payer: Self-pay | Source: Home / Self Care | Attending: Orthopedic Surgery

## 2018-07-21 ENCOUNTER — Ambulatory Visit (HOSPITAL_BASED_OUTPATIENT_CLINIC_OR_DEPARTMENT_OTHER)
Admission: RE | Admit: 2018-07-21 | Discharge: 2018-07-21 | Disposition: A | Discharge status: Home / Self Care | Payer: Medicare Other | Attending: Orthopedic Surgery | Admitting: Orthopedic Surgery

## 2018-07-21 ENCOUNTER — Other Ambulatory Visit: Payer: Self-pay

## 2018-07-21 ENCOUNTER — Ambulatory Visit (HOSPITAL_BASED_OUTPATIENT_CLINIC_OR_DEPARTMENT_OTHER): Payer: Medicare Other | Admitting: Anesthesiology

## 2018-07-21 ENCOUNTER — Encounter (HOSPITAL_BASED_OUTPATIENT_CLINIC_OR_DEPARTMENT_OTHER): Payer: Self-pay | Admitting: Certified Registered"

## 2018-07-21 DIAGNOSIS — M545 Low back pain: Secondary | ICD-10-CM | POA: Diagnosis not present

## 2018-07-21 DIAGNOSIS — Z8249 Family history of ischemic heart disease and other diseases of the circulatory system: Secondary | ICD-10-CM | POA: Diagnosis not present

## 2018-07-21 DIAGNOSIS — Z9842 Cataract extraction status, left eye: Secondary | ICD-10-CM | POA: Insufficient documentation

## 2018-07-21 DIAGNOSIS — Z888 Allergy status to other drugs, medicaments and biological substances status: Secondary | ICD-10-CM | POA: Insufficient documentation

## 2018-07-21 DIAGNOSIS — R569 Unspecified convulsions: Secondary | ICD-10-CM | POA: Diagnosis not present

## 2018-07-21 DIAGNOSIS — Z9841 Cataract extraction status, right eye: Secondary | ICD-10-CM | POA: Diagnosis not present

## 2018-07-21 DIAGNOSIS — Z833 Family history of diabetes mellitus: Secondary | ICD-10-CM | POA: Insufficient documentation

## 2018-07-21 DIAGNOSIS — I4819 Other persistent atrial fibrillation: Secondary | ICD-10-CM | POA: Diagnosis not present

## 2018-07-21 DIAGNOSIS — S52572A Other intraarticular fracture of lower end of left radius, initial encounter for closed fracture: Secondary | ICD-10-CM | POA: Diagnosis not present

## 2018-07-21 DIAGNOSIS — I4891 Unspecified atrial fibrillation: Secondary | ICD-10-CM | POA: Diagnosis not present

## 2018-07-21 DIAGNOSIS — S52502A Unspecified fracture of the lower end of left radius, initial encounter for closed fracture: Secondary | ICD-10-CM | POA: Diagnosis not present

## 2018-07-21 DIAGNOSIS — Z818 Family history of other mental and behavioral disorders: Secondary | ICD-10-CM | POA: Diagnosis not present

## 2018-07-21 DIAGNOSIS — W19XXXA Unspecified fall, initial encounter: Secondary | ICD-10-CM | POA: Diagnosis not present

## 2018-07-21 DIAGNOSIS — E119 Type 2 diabetes mellitus without complications: Secondary | ICD-10-CM | POA: Diagnosis not present

## 2018-07-21 DIAGNOSIS — F329 Major depressive disorder, single episode, unspecified: Secondary | ICD-10-CM | POA: Diagnosis not present

## 2018-07-21 DIAGNOSIS — G8918 Other acute postprocedural pain: Secondary | ICD-10-CM | POA: Diagnosis not present

## 2018-07-21 DIAGNOSIS — F209 Schizophrenia, unspecified: Secondary | ICD-10-CM | POA: Diagnosis not present

## 2018-07-21 DIAGNOSIS — E039 Hypothyroidism, unspecified: Secondary | ICD-10-CM | POA: Diagnosis not present

## 2018-07-21 DIAGNOSIS — I8390 Asymptomatic varicose veins of unspecified lower extremity: Secondary | ICD-10-CM | POA: Insufficient documentation

## 2018-07-21 DIAGNOSIS — S52572K Other intraarticular fracture of lower end of left radius, subsequent encounter for closed fracture with nonunion: Secondary | ICD-10-CM | POA: Diagnosis present

## 2018-07-21 DIAGNOSIS — Z6841 Body Mass Index (BMI) 40.0 and over, adult: Secondary | ICD-10-CM | POA: Insufficient documentation

## 2018-07-21 DIAGNOSIS — Y939 Activity, unspecified: Secondary | ICD-10-CM | POA: Diagnosis not present

## 2018-07-21 DIAGNOSIS — M1990 Unspecified osteoarthritis, unspecified site: Secondary | ICD-10-CM | POA: Insufficient documentation

## 2018-07-21 HISTORY — DX: Hypothyroidism, unspecified: E03.9

## 2018-07-21 HISTORY — DX: Unspecified fracture of the lower end of left radius, initial encounter for closed fracture: S52.502A

## 2018-07-21 HISTORY — DX: Chronic atrial fibrillation, unspecified: I48.20

## 2018-07-21 HISTORY — DX: Schizophrenia, unspecified: F20.9

## 2018-07-21 HISTORY — PX: OPEN REDUCTION INTERNAL FIXATION (ORIF) DISTAL RADIAL FRACTURE: SHX5989

## 2018-07-21 SURGERY — OPEN REDUCTION INTERNAL FIXATION (ORIF) DISTAL RADIUS FRACTURE
Anesthesia: General | Site: Wrist | Laterality: Left

## 2018-07-21 MED ORDER — PROPOFOL 10 MG/ML IV BOLUS
INTRAVENOUS | Status: DC | PRN
  Administered 2018-07-21: 200 mg via INTRAVENOUS

## 2018-07-21 MED ORDER — FENTANYL CITRATE (PF) 100 MCG/2ML IJ SOLN
25.0000 ug | INTRAMUSCULAR | Status: DC | PRN
  Administered 2018-07-21: 50 ug via INTRAVENOUS
  Administered 2018-07-21: 25 ug via INTRAVENOUS

## 2018-07-21 MED ORDER — PROPOFOL 10 MG/ML IV BOLUS
INTRAVENOUS | Status: AC
  Filled 2018-07-21: qty 20

## 2018-07-21 MED ORDER — CEFAZOLIN SODIUM-DEXTROSE 2-4 GM/100ML-% IV SOLN
INTRAVENOUS | Status: AC
  Filled 2018-07-21: qty 100

## 2018-07-21 MED ORDER — ROPIVACAINE HCL 7.5 MG/ML IJ SOLN
INTRAMUSCULAR | Status: DC | PRN
  Administered 2018-07-21: 30 mL via PERINEURAL

## 2018-07-21 MED ORDER — PHENYLEPHRINE 40 MCG/ML (10ML) SYRINGE FOR IV PUSH (FOR BLOOD PRESSURE SUPPORT)
PREFILLED_SYRINGE | INTRAVENOUS | Status: AC
  Filled 2018-07-21: qty 20

## 2018-07-21 MED ORDER — LIDOCAINE 2% (20 MG/ML) 5 ML SYRINGE
INTRAMUSCULAR | Status: AC
  Filled 2018-07-21: qty 10

## 2018-07-21 MED ORDER — ESMOLOL HCL 100 MG/10ML IV SOLN
INTRAVENOUS | Status: DC | PRN
  Administered 2018-07-21 (×5): 20 mg via INTRAVENOUS

## 2018-07-21 MED ORDER — ONDANSETRON HCL 4 MG/2ML IJ SOLN
INTRAMUSCULAR | Status: DC | PRN
  Administered 2018-07-21: 4 mg via INTRAVENOUS

## 2018-07-21 MED ORDER — FENTANYL CITRATE (PF) 100 MCG/2ML IJ SOLN
INTRAMUSCULAR | Status: AC
  Filled 2018-07-21: qty 2

## 2018-07-21 MED ORDER — ACETAMINOPHEN 160 MG/5ML PO SOLN: 325.0000 mg | ORAL | Status: DC | PRN

## 2018-07-21 MED ORDER — HYDRALAZINE HCL 20 MG/ML IJ SOLN
INTRAMUSCULAR | Status: AC
  Filled 2018-07-21: qty 1

## 2018-07-21 MED ORDER — OXYCODONE-ACETAMINOPHEN 5-325 MG PO TABS: ORAL_TABLET | ORAL | 0 refills | Status: DC

## 2018-07-21 MED ORDER — SUGAMMADEX SODIUM 200 MG/2ML IV SOLN
INTRAVENOUS | Status: DC | PRN
  Administered 2018-07-21: 300 mg via INTRAVENOUS

## 2018-07-21 MED ORDER — OXYCODONE HCL 5 MG PO TABS: 5.0000 mg | ORAL_TABLET | Freq: Once | ORAL | Status: DC | PRN

## 2018-07-21 MED ORDER — MEPERIDINE HCL 25 MG/ML IJ SOLN: 6.2500 mg | INTRAMUSCULAR | Status: DC | PRN

## 2018-07-21 MED ORDER — HYDRALAZINE HCL 20 MG/ML IJ SOLN
10.0000 mg | Freq: Once | INTRAMUSCULAR | Status: AC
  Administered 2018-07-21: 10 mg via INTRAVENOUS

## 2018-07-21 MED ORDER — SUCCINYLCHOLINE CHLORIDE 20 MG/ML IJ SOLN
INTRAMUSCULAR | Status: DC | PRN
  Administered 2018-07-21: 160 mg via INTRAVENOUS

## 2018-07-21 MED ORDER — ROCURONIUM BROMIDE 100 MG/10ML IV SOLN
INTRAVENOUS | Status: DC | PRN
  Administered 2018-07-21: 30 mg via INTRAVENOUS

## 2018-07-21 MED ORDER — DEXTROSE 5 % IV SOLN
INTRAVENOUS | Status: DC | PRN
  Administered 2018-07-21: 3 g via INTRAVENOUS

## 2018-07-21 MED ORDER — CHLORHEXIDINE GLUCONATE 4 % EX LIQD: 60.0000 mL | Freq: Once | CUTANEOUS | Status: DC

## 2018-07-21 MED ORDER — SCOPOLAMINE 1 MG/3DAYS TD PT72: 1.0000 | MEDICATED_PATCH | Freq: Once | TRANSDERMAL | Status: DC | PRN

## 2018-07-21 MED ORDER — CLONIDINE HCL (ANALGESIA) 100 MCG/ML EP SOLN
EPIDURAL | Status: DC | PRN
  Administered 2018-07-21: 100 ug

## 2018-07-21 MED ORDER — CEFAZOLIN SODIUM-DEXTROSE 1-4 GM/50ML-% IV SOLN
INTRAVENOUS | Status: AC
  Filled 2018-07-21: qty 50

## 2018-07-21 MED ORDER — CEFAZOLIN SODIUM-DEXTROSE 2-4 GM/100ML-% IV SOLN: 2.0000 g | INTRAVENOUS | Status: DC

## 2018-07-21 MED ORDER — DEXAMETHASONE SODIUM PHOSPHATE 10 MG/ML IJ SOLN
INTRAMUSCULAR | Status: DC | PRN
  Administered 2018-07-21: 10 mg via INTRAVENOUS

## 2018-07-21 MED ORDER — FENTANYL CITRATE (PF) 100 MCG/2ML IJ SOLN
50.0000 ug | INTRAMUSCULAR | Status: DC | PRN
  Administered 2018-07-21: 50 ug via INTRAVENOUS

## 2018-07-21 MED ORDER — MIDAZOLAM HCL 2 MG/2ML IJ SOLN
1.0000 mg | INTRAMUSCULAR | Status: DC | PRN
  Administered 2018-07-21: 2 mg via INTRAVENOUS

## 2018-07-21 MED ORDER — ACETAMINOPHEN 325 MG PO TABS: 325.0000 mg | ORAL_TABLET | ORAL | Status: DC | PRN

## 2018-07-21 MED ORDER — MIDAZOLAM HCL 2 MG/2ML IJ SOLN
INTRAMUSCULAR | Status: AC
  Filled 2018-07-21: qty 2

## 2018-07-21 MED ORDER — OXYCODONE HCL 5 MG/5ML PO SOLN: 5.0000 mg | Freq: Once | ORAL | Status: DC | PRN

## 2018-07-21 MED ORDER — LIDOCAINE 2% (20 MG/ML) 5 ML SYRINGE
INTRAMUSCULAR | Status: DC | PRN
  Administered 2018-07-21: 80 mg via INTRAVENOUS

## 2018-07-21 MED ORDER — FENTANYL CITRATE (PF) 100 MCG/2ML IJ SOLN
INTRAMUSCULAR | Status: DC | PRN
  Administered 2018-07-21 (×2): 50 ug via INTRAVENOUS

## 2018-07-21 MED ORDER — LACTATED RINGERS IV SOLN
INTRAVENOUS | Status: DC
  Administered 2018-07-21: 13:00:00 via INTRAVENOUS

## 2018-07-21 MED ORDER — ONDANSETRON HCL 4 MG/2ML IJ SOLN: 4.0000 mg | Freq: Once | INTRAMUSCULAR | Status: DC | PRN

## 2018-07-21 SURGICAL SUPPLY — 63 items
BANDAGE ACE 3X5.8 VEL STRL LF (GAUZE/BANDAGES/DRESSINGS) ×2 IMPLANT
BIT DRILL 2.0 LNG QUCK RELEASE (BIT) IMPLANT
BIT DRILL 2.8 QUICK RELEASE (BIT) IMPLANT
BLADE SURG 15 STRL LF DISP TIS (BLADE) ×2 IMPLANT
BLADE SURG 15 STRL SS (BLADE) ×2
BNDG ESMARK 4X9 LF (GAUZE/BANDAGES/DRESSINGS) ×2 IMPLANT
BNDG GAUZE ELAST 4 BULKY (GAUZE/BANDAGES/DRESSINGS) ×2 IMPLANT
BNDG PLASTER X FAST 3X3 WHT LF (CAST SUPPLIES) ×20 IMPLANT
CHLORAPREP W/TINT 26ML (MISCELLANEOUS) ×2 IMPLANT
CORD BIPOLAR FORCEPS 12FT (ELECTRODE) ×2 IMPLANT
COVER BACK TABLE 60X90IN (DRAPES) ×2 IMPLANT
COVER MAYO STAND STRL (DRAPES) ×2 IMPLANT
COVER WAND RF STERILE (DRAPES) IMPLANT
CUFF TOURNIQUET SINGLE 18IN (TOURNIQUET CUFF) IMPLANT
CUFF TOURNIQUET SINGLE 24IN (TOURNIQUET CUFF) ×1 IMPLANT
DRAPE EXTREMITY T 121X128X90 (DISPOSABLE) ×2 IMPLANT
DRAPE OEC MINIVIEW 54X84 (DRAPES) ×2 IMPLANT
DRAPE SURG 17X23 STRL (DRAPES) ×2 IMPLANT
DRILL 2.0 LNG QUICK RELEASE (BIT) ×2
DRILL 2.8 QUICK RELEASE (BIT) ×2
GAUZE SPONGE 4X4 12PLY STRL (GAUZE/BANDAGES/DRESSINGS) ×2 IMPLANT
GAUZE XEROFORM 1X8 LF (GAUZE/BANDAGES/DRESSINGS) ×2 IMPLANT
GLOVE BIO SURGEON STRL SZ7.5 (GLOVE) ×2 IMPLANT
GLOVE BIOGEL PI IND STRL 8 (GLOVE) ×1 IMPLANT
GLOVE BIOGEL PI IND STRL 8.5 (GLOVE) IMPLANT
GLOVE BIOGEL PI INDICATOR 8 (GLOVE) ×1
GLOVE BIOGEL PI INDICATOR 8.5 (GLOVE) ×1
GLOVE SURG ORTHO 8.0 STRL STRW (GLOVE) ×1 IMPLANT
GLOVE SURG SS PI 6.5 STRL IVOR (GLOVE) ×1 IMPLANT
GLOVE SURG SYN 8.0 (GLOVE) ×4 IMPLANT
GLOVE SURG SYN 8.0 PF PI (GLOVE) IMPLANT
GOWN STRL REIN XL XLG (GOWN DISPOSABLE) ×1 IMPLANT
GOWN STRL REUS W/ TWL LRG LVL3 (GOWN DISPOSABLE) ×1 IMPLANT
GOWN STRL REUS W/TWL LRG LVL3 (GOWN DISPOSABLE) ×1
GOWN STRL REUS W/TWL XL LVL3 (GOWN DISPOSABLE) ×2 IMPLANT
GUIDEWIRE ORTHO 0.054X6 (WIRE) ×3 IMPLANT
NDL HYPO 25X1 1.5 SAFETY (NEEDLE) IMPLANT
NEEDLE HYPO 25X1 1.5 SAFETY (NEEDLE) IMPLANT
NS IRRIG 1000ML POUR BTL (IV SOLUTION) ×2 IMPLANT
PACK BASIN DAY SURGERY FS (CUSTOM PROCEDURE TRAY) ×2 IMPLANT
PAD CAST 3X4 CTTN HI CHSV (CAST SUPPLIES) ×1 IMPLANT
PADDING CAST COTTON 3X4 STRL (CAST SUPPLIES) ×1
PLATE DISTAL RADIUS L WIDE (Plate) ×1 IMPLANT
SCREW CORT FT 18X2.3XLCK HEX (Screw) IMPLANT
SCREW CORT FT 20X2.3XLCK HEX (Screw) IMPLANT
SCREW CORTICAL LOCKING 2.3X18M (Screw) ×1 IMPLANT
SCREW CORTICAL LOCKING 2.3X20M (Screw) ×3 IMPLANT
SCREW CORTICAL LOCKING 2.3X22M (Screw) ×3 IMPLANT
SCREW CORTICAL LOCKING 2.3X24M (Screw) ×1 IMPLANT
SCREW FX20X2.3XSMTH LCK NS CRT (Screw) IMPLANT
SCREW FX22X2.3XLCK SMTH NS CRT (Screw) IMPLANT
SCREW FX24X2.3XLCK SMTH NS CRT (Screw) IMPLANT
SCREW HEXALOBE NON-LOCK 3.5X14 (Screw) ×2 IMPLANT
SCREW NONLOCK HEX 3.5X12 (Screw) ×2 IMPLANT
SLEEVE SCD COMPRESS KNEE MED (MISCELLANEOUS) ×1 IMPLANT
SLING ARM FOAM STRAP XLG (SOFTGOODS) ×1 IMPLANT
STOCKINETTE 4X48 STRL (DRAPES) ×2 IMPLANT
SUT ETHILON 4 0 PS 2 18 (SUTURE) ×2 IMPLANT
SUT VICRYL 4-0 PS2 18IN ABS (SUTURE) ×2 IMPLANT
SYR BULB 3OZ (MISCELLANEOUS) ×2 IMPLANT
SYR CONTROL 10ML LL (SYRINGE) IMPLANT
TOWEL GREEN STERILE FF (TOWEL DISPOSABLE) ×4 IMPLANT
UNDERPAD 30X30 (UNDERPADS AND DIAPERS) ×2 IMPLANT

## 2018-07-21 NOTE — Op Note (Signed)
Intra-operative fluoroscopic images in the AP, lateral, and oblique views were taken and evaluated by myself.  Reduction and hardware placement were confirmed.  There was no intraarticular penetration of permanent hardware.  

## 2018-07-21 NOTE — Anesthesia Postprocedure Evaluation (Signed)
Anesthesia Post Note  Patient: [name]  Procedure(s) Performed: OPEN REDUCTION INTERNAL FIXATION (ORIF) DISTAL RADIAL FRACTURE (Left Wrist)     Patient location during evaluation: PACU Anesthesia Type: General Level of consciousness: awake and alert Pain management: pain level controlled Vital Signs Assessment: post-procedure vital signs reviewed and stable Respiratory status: spontaneous breathing, nonlabored ventilation, respiratory function stable and patient connected to nasal cannula oxygen Cardiovascular status: blood pressure returned to baseline and stable Postop Assessment: no apparent nausea or vomiting Anesthetic complications: no    Last Vitals:  Vitals:   07/21/18 1620 07/21/18 1635  BP:  (!) 154/96  Pulse:  98  Resp:  20  Temp:  36.4 C  SpO2: 92% 92%    Last Pain:  Vitals:   07/21/18 1635  TempSrc: Oral  PainSc: 4                  Oval Moralez

## 2018-07-21 NOTE — Discharge Instructions (Signed)

## 2018-07-21 NOTE — H&P (Signed)
  [name] is an 58 y.o. male.   Chief Complaint: left wrist fracture HPI: 58 yo male states he fell 07/16/18 injuring left wrist.  Seen at [facility] where XR revealed distal radius fracture.  Splinted and followed up in office.  He wishes to proceed with operative fixation.  Allergies:  Allergies  Allergen Reactions  . Lamictal [Lamotrigine] Rash  . Metformin And Related Rash    Past Medical History:  Diagnosis Date  . A-fib ([place])   . Arrhythmia   . Chronic a-fib   . Closed fracture of left distal radius   . Depression   . Depression   . Hypothyroidism   . Low back pain   . Morbid obesity, BMI unknown ([place])   . Osteoarthritis    oa in bilateral knees  . Schizophrenia ([place])   . Seizures ([place])   . Thyroid disease   . Varicose veins     Past Surgical History:  Procedure Laterality Date  . ACHILLES TENDON REPAIR  approx. 2004   right foot  . CATARACT EXTRACTION     bilateral  . stab phlebectomy  Right 11/09/2016   stab phlebectomy R leg by Tinnie Gens MD  . TOOTH EXTRACTION      Family History: Family History  Problem Relation Age of Onset  . Diabetes Father   . Heart disease Father   . Diabetes Brother   . OCD Mother     Social History:   reports that he has never smoked. He has never used smokeless tobacco. He reports that he does not drink alcohol or use drugs.  Medications: No medications prior to admission.    No results found for this or any previous visit (from the past 48 hour(s)).  No results found.   A comprehensive review of systems was negative.  Height 5\' 9"  (1.753 m), weight 122.5 kg.  General appearance: alert, cooperative and appears stated age Head: Normocephalic, without obvious abnormality, atraumatic Neck: supple, symmetrical, trachea midline Cardio: regular rate and rhythm Resp: clear to auscultation bilaterally Extremities: Intact sensation and capillary refill all digits.  +epl/fpl/io.  No wounds.  Pulses: 2+ and  symmetric Skin: Skin color, texture, turgor normal. No rashes or lesions Neurologic: Grossly normal Incision/Wound: none  Assessment/Plan Left distal radius fracture.  Non operative and operative treatment options have been discussed with the patient and patient wishes to proceed with operative treatment. Risks, benefits, and alternatives of surgery have been discussed and the patient agrees with the plan of care.   [name] 07/21/2018, 10:37 AM

## 2018-07-21 NOTE — Op Note (Signed)
07/21/2018 [facility]  Operative Note  Pre Op Diagnosis: Left comminuted intraarticular distal radius fracture  Post Op Diagnosis: Left comminuted intraarticular distal radius fracture  Procedure:  1. ORIF Left comminuted intraarticular distal radius fracture, 3 intraarticular fragments 2. Left brachioradialis release  Surgeon: [name], MD  Assistant: Daryll Brod, MD  Anesthesia: General with regional  Fluids: Per anesthesia flow sheet  EBL: minimal  Complications: None  Specimen: None  Tourniquet Time:  Total Tourniquet Time Documented: Upper Arm (Left) - 39 minutes Total: Upper Arm (Left) - 39 minutes   Disposition: Stable to PACU  INDICATIONS:  [name] is a 58 y.o. male fell from a standing height over the weekend injuring left wrist.  Seen in ED where XR revealed distal radius fracture.  Splinted and followed up in office.  We discussed nonoperative and operative treatment options.  He wished to proceed with operative fixation.  Risks, benefits, and alternatives of surgery were discussed including the risk of blood loss; infection; damage to nerves, vessels, tendons, ligaments, bone; failure of surgery; need for additional surgery; complications with wound healing; continued pain; nonunion; malunion; stiffness.  We also discussed the possible need for bone graft and the benefits and risks including the possibility of disease transmission.  He voiced understanding of these risks and elected to proceed.    OPERATIVE COURSE:  After being identified preoperatively by myself, the patient and I agreed upon the procedure and site of procedure.  Surgical site was marked.  The risks, benefits and alternatives of the surgery were reviewed and he wished to proceed.  Surgical consent had been signed.  He was given IV Ancef as preoperative antibiotic prophylaxis.  He was transferred to the operating room and placed on the operating room table in supine position  with the Left upper extremity on an armboard. General anesthesia was induced by the anesthesiologist.  A regional block had been performed by anesthesia in preoperative holding.  The Left upper extremity was prepped and draped in normal sterile orthopedic fashion.  A surgical pause was performed between the surgeons, anesthesia and operating room staff, and all were in agreement as to the patient, procedure and site of procedure.  Tourniquet at the proximal aspect of the extremity was inflated to 250 mmHg after exsanguination of the limb with an Esmarch bandage.  Standard volar Mallie Mussel approach was used.  The bipolar electrocautery was used to obtain hemostasis.  The superficial and deep portions of the FCR tendon sheath were incised, and the FCR and FPL were swept ulnarly to protect the palmar cutaneous branch of the median nerve.  The brachioradialis was released at the radial side of the radius.  The pronator quadratus was released and elevated with the periosteal elevator.  The fracture site was identified and cleared of soft tissue interposition and hematoma.  It was reduced under direct visualization.  There was intraarticular extension creating three intraarticular fragments.  An AcuMed volar distal radial locking plate was selected.  It was secured to the bone with the guidepins.  C-arm was used in AP and lateral projections to ensure appropriate reduction and position of the hardware and adjustments made as necessary.  Standard AO drilling and measuring technique was used.  A single screw was placed in the slotted hole in the shaft of the plate.  The distal holes were filled with locking pegs with the exception of the styloid holes, which were filled with locking screws.  The remaining holes in the shaft of  the plate were filled with nonlocking screws.  Good purchase was obtained.  C-arm was used in AP, lateral and oblique projections to ensure appropriate reduction and position of hardware, which was the  case.  There was no intra-articular penetration of hardware.  The wound was copiously irrigated with sterile saline.  Pronator quadratus was repaired back over top of the plate using 4-0 Vicryl suture.  Vicryl suture was placed in the subcutaneous tissues in an inverted interrupted fashion and the skin was closed with 4-0 nylon in a horizontal mattress fashion.  There was good pronation and supination of the wrist without crepitance.  The wound was then dressed with sterile Xeroform, 4x4s, and wrapped with a Kerlix bandage.  A volar splint was placed and wrapped with Kerlix and Ace bandage.  Tourniquet was deflated at 39 minutes.  Fingertips were pink with brisk capillary refill after deflation of the tourniquet.  Operative drapes were broken down.  The patient was awoken from anesthesia safely.  He was transferred back to the stretcher and taken to the PACU in stable condition.  I will see him back in the office in one week for postoperative followup.  I will give him a prescription for Percocet 5/325 1-2 tabs PO q6 hours prn pain, dispense # 30.    [name], MD Electronically signed, 07/21/18

## 2018-07-21 NOTE — Anesthesia Procedure Notes (Addendum)
Anesthesia Regional Block: Supraclavicular block   Pre-Anesthetic Checklist: ,, timeout performed, Correct Patient, Correct Site, Correct Laterality, Correct Procedure, Correct Position, site marked, Risks and benefits discussed,  Surgical consent,  Pre-op evaluation,  At surgeon's request and post-op pain management  Laterality: Left  Prep: chloraprep       Needles:  Injection technique: Single-shot  Needle Type: Echogenic Stimulator Needle     Needle Length: 5cm  Needle Gauge: 22     Additional Needles:   Procedures:, nerve stimulator,,, ultrasound used (permanent image in chart),,,,   Nerve Stimulator or Paresthesia:  Response: delt, 0.45 mA,   Additional Responses:   Narrative:  Start time: 07/21/2018 1:13 PM End time: 07/21/2018 1:22 PM Injection made incrementally with aspirations every 5 mL.  Performed by: Personally  Anesthesiologist: Janeece Riggers, MD  Additional Notes: Functioning IV was confirmed and monitors were applied.  A 8mm 22ga Arrow echogenic stimulator needle was used. Sterile prep and drape,hand hygiene and sterile gloves were used. Ultrasound guidance: relevant anatomy identified, needle position confirmed, local anesthetic spread visualized around nerve(s)., vascular puncture avoided.  Image printed for medical record. Negative aspiration and negative test dose prior to incremental administration of local anesthetic. The patient tolerated the procedure well.

## 2018-07-21 NOTE — Progress Notes (Signed)
Assisted Dr. Oddono with left, ultrasound guided, supraclavicular block. Side rails up, monitors on throughout procedure. See vital signs in flow sheet. Tolerated Procedure well. 

## 2018-07-21 NOTE — Anesthesia Procedure Notes (Signed)
Procedure Name: Intubation Date/Time: 07/21/2018 2:10 PM Performed by: Gwyndolyn Saxon, CRNA Pre-anesthesia Checklist: Patient identified, Emergency Drugs available, Suction available and Patient being monitored Patient Re-evaluated:Patient Re-evaluated prior to induction Oxygen Delivery Method: Circle system utilized Preoxygenation: Pre-oxygenation with 100% oxygen Induction Type: IV induction Ventilation: Mask ventilation without difficulty Laryngoscope Size: Glidescope and 3 Grade View: Grade I Tube type: Oral Tube size: 7.0 mm Number of attempts: 1 Airway Equipment and Method: Patient positioned with wedge pillow and Rigid stylet Placement Confirmation: ETT inserted through vocal cords under direct vision,  positive ETCO2 and breath sounds checked- equal and bilateral Secured at: 23 cm Tube secured with: Tape Dental Injury: Teeth and Oropharynx as per pre-operative assessment

## 2018-07-21 NOTE — Op Note (Signed)
I assisted Surgeon(s) and Role:    * [name], MD - Primary    Daryll Brod, MD - Assisting on the Procedure(s): OPEN REDUCTION INTERNAL FIXATION (ORIF) DISTAL RADIAL FRACTURE on 07/21/2018.  I provided assistance on this case as follows: setup, approach, isolation and debridement of the fracture, reduction, stabilization and fixation of the fracture, closure of the incision and application of the dressing and splint. Electronically signed by: Daryll Brod, MD Date: 07/21/2018 Time: 3:05 PM

## 2018-07-21 NOTE — Transfer of Care (Signed)
Immediate Anesthesia Transfer of Care Note  Patient: [name]  Procedure(s) Performed: OPEN REDUCTION INTERNAL FIXATION (ORIF) DISTAL RADIAL FRACTURE (Left Wrist)  Patient Location: PACU  Anesthesia Type:General  Level of Consciousness: drowsy and patient cooperative  Airway & Oxygen Therapy: Patient Spontanous Breathing and Patient connected to face mask oxygen  Post-op Assessment: Report given to RN and Post -op Vital signs reviewed and stable  Post vital signs: Reviewed and stable  Last Vitals:  Vitals Value Taken Time  BP 157/112 07/21/2018  3:15 PM  Temp    Pulse 92 07/21/2018  3:21 PM  Resp 19 07/21/2018  3:21 PM  SpO2 94 % 07/21/2018  3:21 PM  Vitals shown include unvalidated device data.  Last Pain:  Vitals:   07/21/18 1515  TempSrc:   PainSc: (P) Asleep         Complications: No apparent anesthesia complications

## 2018-07-21 NOTE — Anesthesia Preprocedure Evaluation (Addendum)
Anesthesia Evaluation  Patient identified by MRN, date of birth, ID band Patient awake    Reviewed: Allergy & Precautions, H&P , NPO status , Patient's Chart, lab work & pertinent test results, reviewed documented beta blocker date and time   Airway Mallampati: II  TM Distance: >3 FB Neck ROM: full    Dental no notable dental hx.    Pulmonary neg pulmonary ROS,    Pulmonary exam normal breath sounds clear to auscultation       Cardiovascular Exercise Tolerance: Good + dysrhythmias Atrial Fibrillation  Rhythm:regular Rate:Normal     Neuro/Psych Seizures -,  PSYCHIATRIC DISORDERS Depression Schizophrenia    GI/Hepatic negative GI ROS, Neg liver ROS,   Endo/Other  diabetes, Type 2, Oral Hypoglycemic AgentsHypothyroidism   Renal/GU negative Renal ROS  negative genitourinary   Musculoskeletal  (+) Arthritis ,   Abdominal   Peds  Hematology negative hematology ROS (+)   Anesthesia Other Findings   Reproductive/Obstetrics negative OB ROS                             Anesthesia Physical Anesthesia Plan  ASA: III  Anesthesia Plan: General   Post-op Pain Management: GA combined w/ Regional for post-op pain   Induction:   PONV Risk Score and Plan: 2 and Ondansetron, Treatment may vary due to age or medical condition and Dexamethasone  Airway Management Planned: Oral ETT and LMA  Additional Equipment:   Intra-op Plan:   Post-operative Plan: Extubation in OR  Informed Consent: I have reviewed the patients History and Physical, chart, labs and discussed the procedure including the risks, benefits and alternatives for the proposed anesthesia with the patient or authorized representative who has indicated his/her understanding and acceptance.     Dental Advisory Given  Plan Discussed with: CRNA, Anesthesiologist and Surgeon  Anesthesia Plan Comments: (Discussed both nerve block for pain  relief post-op and GA; including NV, sore throat, dental injury, and pulmonary complications)        Anesthesia Quick Evaluation

## 2018-07-22 ENCOUNTER — Encounter (HOSPITAL_BASED_OUTPATIENT_CLINIC_OR_DEPARTMENT_OTHER): Payer: Self-pay | Admitting: Orthopedic Surgery

## 2018-07-27 DIAGNOSIS — S52572A Other intraarticular fracture of lower end of left radius, initial encounter for closed fracture: Secondary | ICD-10-CM | POA: Diagnosis not present

## 2018-07-27 DIAGNOSIS — M25532 Pain in left wrist: Secondary | ICD-10-CM | POA: Diagnosis not present

## 2018-07-27 DIAGNOSIS — M25632 Stiffness of left wrist, not elsewhere classified: Secondary | ICD-10-CM | POA: Diagnosis not present

## 2018-07-28 DIAGNOSIS — W108XXD Fall (on) (from) other stairs and steps, subsequent encounter: Secondary | ICD-10-CM | POA: Diagnosis not present

## 2018-07-28 DIAGNOSIS — M25522 Pain in left elbow: Secondary | ICD-10-CM | POA: Diagnosis not present

## 2018-08-03 DIAGNOSIS — M25522 Pain in left elbow: Secondary | ICD-10-CM | POA: Diagnosis not present

## 2018-08-15 DIAGNOSIS — S52572D Other intraarticular fracture of lower end of left radius, subsequent encounter for closed fracture with routine healing: Secondary | ICD-10-CM | POA: Diagnosis not present

## 2018-08-15 DIAGNOSIS — M25532 Pain in left wrist: Secondary | ICD-10-CM | POA: Diagnosis not present

## 2018-08-15 DIAGNOSIS — M25632 Stiffness of left wrist, not elsewhere classified: Secondary | ICD-10-CM | POA: Diagnosis not present

## 2018-08-23 DIAGNOSIS — M25532 Pain in left wrist: Secondary | ICD-10-CM | POA: Diagnosis not present

## 2018-08-23 DIAGNOSIS — M25632 Stiffness of left wrist, not elsewhere classified: Secondary | ICD-10-CM | POA: Diagnosis not present

## 2018-08-23 DIAGNOSIS — S52572D Other intraarticular fracture of lower end of left radius, subsequent encounter for closed fracture with routine healing: Secondary | ICD-10-CM | POA: Diagnosis not present

## 2018-08-29 DIAGNOSIS — S52572D Other intraarticular fracture of lower end of left radius, subsequent encounter for closed fracture with routine healing: Secondary | ICD-10-CM | POA: Diagnosis not present

## 2018-09-09 ENCOUNTER — Ambulatory Visit (INDEPENDENT_AMBULATORY_CARE_PROVIDER_SITE_OTHER): Payer: Medicare Other | Admitting: Family Medicine

## 2018-09-09 ENCOUNTER — Other Ambulatory Visit: Payer: Self-pay

## 2018-09-09 ENCOUNTER — Encounter: Payer: Self-pay | Admitting: Family Medicine

## 2018-09-09 VITALS — BP 105/62 | HR 95 | Temp 97.6°F | Wt 265.0 lb

## 2018-09-09 DIAGNOSIS — R634 Abnormal weight loss: Secondary | ICD-10-CM | POA: Diagnosis not present

## 2018-09-09 DIAGNOSIS — L219 Seborrheic dermatitis, unspecified: Secondary | ICD-10-CM | POA: Diagnosis not present

## 2018-09-09 DIAGNOSIS — E119 Type 2 diabetes mellitus without complications: Secondary | ICD-10-CM

## 2018-09-09 DIAGNOSIS — Z1211 Encounter for screening for malignant neoplasm of colon: Secondary | ICD-10-CM | POA: Diagnosis not present

## 2018-09-09 LAB — POCT GLYCOSYLATED HEMOGLOBIN (HGB A1C): HbA1c, POC (controlled diabetic range): 6.9 % (ref 0.0–7.0)

## 2018-09-09 MED ORDER — KETOCONAZOLE 2 % EX CREA: 1.0000 "application " | TOPICAL_CREAM | Freq: Every day | CUTANEOUS | 0 refills | Status: DC

## 2018-09-09 NOTE — Assessment & Plan Note (Signed)
A1c today 6.9 up from 5.7. -Increase daily exercise to at least 30 minutes 5 times a week -Less soda, nutrition handout given -Recheck in 3 months.  At that time, consider starting metformin, statin.

## 2018-09-09 NOTE — Patient Instructions (Addendum)
It was great to see you again today, [name].  Here are the big points from today:  Diabetes -I put nutritional information below -Exercise! (30 minutes 5 times per week)  Nose -antifungal cream daily -follow up at next visit  Weight loss -probably a good thing -I will check a thyroid lab  Screening -Colonoscopy   Diabetes Mellitus and Nutrition, Adult When you have diabetes (diabetes mellitus), it is very important to have healthy eating habits because your blood sugar (glucose) levels are greatly affected by what you eat and drink. Eating healthy foods in the appropriate amounts, at about the same times every day, can help you:  Control your blood glucose.  Lower your risk of heart disease.  Improve your blood pressure.  Reach or maintain a healthy weight. Every person with diabetes is different, and each person has different needs for a meal plan. Your health care provider may recommend that you work with a diet and nutrition specialist (dietitian) to make a meal plan that is best for you. Your meal plan may vary depending on factors such as:  The calories you need.  The medicines you take.  Your weight.  Your blood glucose, blood pressure, and cholesterol levels.  Your activity level.  Other health conditions you have, such as heart or kidney disease. How do carbohydrates affect me? Carbohydrates, also called carbs, affect your blood glucose level more than any other type of food. Eating carbs naturally raises the amount of glucose in your blood. Carb counting is a method for keeping track of how many carbs you eat. Counting carbs is important to keep your blood glucose at a healthy level, especially if you use insulin or take certain oral diabetes medicines. It is important to know how many carbs you can safely have in each meal. This is different for every person. Your dietitian can help you calculate how many carbs you should have at each meal and for each  snack. Foods that contain carbs include:  Bread, cereal, rice, pasta, and crackers.  Potatoes and corn.  Peas, beans, and lentils.  Milk and yogurt.  Fruit and juice.  Desserts, such as cakes, cookies, ice cream, and candy. How does alcohol affect me? Alcohol can cause a sudden decrease in blood glucose (hypoglycemia), especially if you use insulin or take certain oral diabetes medicines. Hypoglycemia can be a life-threatening condition. Symptoms of hypoglycemia (sleepiness, dizziness, and confusion) are similar to symptoms of having too much alcohol. If your health care provider says that alcohol is safe for you, follow these guidelines:  Limit alcohol intake to no more than 1 drink per day for nonpregnant women and 2 drinks per day for men. One drink equals 12 oz of beer, 5 oz of wine, or 1 oz of hard liquor.  Do not drink on an empty stomach.  Keep yourself hydrated with water, diet soda, or unsweetened iced tea.  Keep in mind that regular soda, juice, and other mixers may contain a lot of sugar and must be counted as carbs. What are tips for following this plan?  Reading food labels  Start by checking the serving size on the "Nutrition Facts" label of packaged foods and drinks. The amount of calories, carbs, fats, and other nutrients listed on the label is based on one serving of the item. Many items contain more than one serving per package.  Check the total grams (g) of carbs in one serving. You can calculate the number of servings of carbs in  one serving by dividing the total carbs by 15. For example, if a food has 30 g of total carbs, it would be equal to 2 servings of carbs.  Check the number of grams (g) of saturated and trans fats in one serving. Choose foods that have low or no amount of these fats.  Check the number of milligrams (mg) of salt (sodium) in one serving. Most people should limit total sodium intake to less than 2,300 mg per day.  Always check the  nutrition information of foods labeled as "low-fat" or "nonfat". These foods may be higher in added sugar or refined carbs and should be avoided.  Talk to your dietitian to identify your daily goals for nutrients listed on the label. Shopping  Avoid buying canned, premade, or processed foods. These foods tend to be high in fat, sodium, and added sugar.  Shop around the outside edge of the grocery store. This includes fresh fruits and vegetables, bulk grains, fresh meats, and fresh dairy. Cooking  Use low-heat cooking methods, such as baking, instead of high-heat cooking methods like deep frying.  Cook using healthy oils, such as olive, canola, or sunflower oil.  Avoid cooking with butter, cream, or high-fat meats. Meal planning  Eat meals and snacks regularly, preferably at the same times every day. Avoid going long periods of time without eating.  Eat foods high in fiber, such as fresh fruits, vegetables, beans, and whole grains. Talk to your dietitian about how many servings of carbs you can eat at each meal.  Eat 4-6 ounces (oz) of lean protein each day, such as lean meat, chicken, fish, eggs, or tofu. One oz of lean protein is equal to: ? 1 oz of meat, chicken, or fish. ? 1 egg. ?  cup of tofu.  Eat some foods each day that contain healthy fats, such as avocado, nuts, seeds, and fish. Lifestyle  Check your blood glucose regularly.  Exercise regularly as told by your health care provider. This may include: ? 150 minutes of moderate-intensity or vigorous-intensity exercise each week. This could be brisk walking, biking, or water aerobics. ? Stretching and doing strength exercises, such as yoga or weightlifting, at least 2 times a week.  Take medicines as told by your health care provider.  Do not use any products that contain nicotine or tobacco, such as cigarettes and e-cigarettes. If you need help quitting, ask your health care provider.  Work with a Social worker or diabetes  educator to identify strategies to manage stress and any emotional and social challenges. Questions to ask a health care provider  Do I need to meet with a diabetes educator?  Do I need to meet with a dietitian?  What number can I call if I have questions?  When are the best times to check my blood glucose? Where to find more information:  American Diabetes Association: diabetes.org  Academy of Nutrition and Dietetics: www.eatright.[employer] of Diabetes and Digestive and Kidney Diseases (NIH): [employer] Summary  A healthy meal plan will help you control your blood glucose and maintain a healthy lifestyle.  Working with a diet and nutrition specialist (dietitian) can help you make a meal plan that is best for you.  Keep in mind that carbohydrates (carbs) and alcohol have immediate effects on your blood glucose levels. It is important to count carbs and to use alcohol carefully. This information is not intended to replace advice given to you by your health care provider. Make sure you discuss any  questions you have with your health care provider. Document Released: 03/19/2005 Document Revised: 01/20/2017 Document Reviewed: 07/27/2016 Elsevier Interactive Patient Education  2019 [employer].

## 2018-09-09 NOTE — Assessment & Plan Note (Signed)
Roughly 10 pound unintentional weight loss in the past year. -Follow-up TSH -GI referral for colonoscopy -Continue to monitor

## 2018-09-09 NOTE — Progress Notes (Signed)
    Subjective:  [name] is a 58 y.o. male who presents to the [facility] today to talk about skin peeling around his nose and diabetes.  HPI: Diabetes He reports that he has been eating more desserts and drinking more soda recently.  He also reported that he has not been exercising very much lately.  He did not report excessive thirst or urination.  He reports that he is eating had been particularly healthy through the holidays.  His A1c today is 6.9 in clinic increased from 5.7.  Seborrheic dermatitis He reports red peeling skin around his nose, forehead, mouth and chin seems to be worsening since last time he was seen.  He was last seen here and diagnosed with rosacea.  He notes that he has worsened pimples on his forehead and cheeks and is increasingly frustrated with his worsening symptoms.  Weight loss He noted that he lost 3 pounds since his last visit to clinic.  On chart review, he appears to have lost 10 pounds in the last 7 months.  He reports that he has not been trying to lose weight and has not been particularly vigilant with diet or exercise.   Chief Complaint noted Review of Symptoms - see HPI PMH - Smoking status noted.    Objective:  Physical Exam: BP 105/62   Pulse 95   Temp 97.6 F (36.4 C) (Oral)   Wt 265 lb (120.2 kg)   SpO2 94%   BMI 39.13 kg/m    Gen: NAD, resting comfortably CV: RRR with no murmurs appreciated HEENT: Significant erythema with dry flaking skin found over multiple areas including brow, nasal ala, below lower lip and beneath the chin.  No honey crusting noted no drainage noted.  Mild excoriation. Pulm: NWOB, CTAB with no crackles, wheezes, or rhonchi Cardiac: Irregular rate and rhythm.  No murmurs/rubs/gallops.  Media Information   Document Information   Photos    09/09/2018 14:47  Attached To:  Clydene Fake  Source Information   Matilde Haymaker, MD  [facility] Resident     Results for orders placed or performed in visit  on 09/09/18 (from the past 72 hour(s))  HgB A1c     Status: None   Collection Time: 09/09/18  2:34 PM  Result Value Ref Range   Hemoglobin A1C     HbA1c POC (<> result, manual entry)     HbA1c, POC (prediabetic range)     HbA1c, POC (controlled diabetic range) 6.9 0.0 - 7.0 %     Assessment/Plan:  T2DM (type 2 diabetes mellitus) (HCC) A1c today 6.9 up from 5.7. -Increase daily exercise to at least 30 minutes 5 times a week -Less soda, nutrition handout given -Recheck in 3 months.  At that time, consider starting metformin, statin.  Loss of weight Roughly 10 pound unintentional weight loss in the past year. -Follow-up TSH -GI referral for colonoscopy -Continue to monitor  Seborrheic dermatitis -Ketoconazole cream 2% -Follow-up -If no improvement, consider selenium sulfide

## 2018-09-09 NOTE — Assessment & Plan Note (Signed)
-  Ketoconazole cream 2% -Follow-up -If no improvement, consider selenium sulfide

## 2018-09-10 LAB — TSH: TSH: 2.24 u[IU]/mL (ref 0.450–4.500)

## 2018-09-12 ENCOUNTER — Ambulatory Visit (INDEPENDENT_AMBULATORY_CARE_PROVIDER_SITE_OTHER): Payer: Medicare Other | Admitting: Psychiatry

## 2018-09-12 ENCOUNTER — Encounter (HOSPITAL_COMMUNITY): Payer: Self-pay | Admitting: Psychiatry

## 2018-09-12 DIAGNOSIS — F251 Schizoaffective disorder, depressive type: Secondary | ICD-10-CM | POA: Diagnosis not present

## 2018-09-12 DIAGNOSIS — F330 Major depressive disorder, recurrent, mild: Secondary | ICD-10-CM | POA: Diagnosis not present

## 2018-09-12 MED ORDER — PHENELZINE SULFATE 15 MG PO TABS: 45.0000 mg | ORAL_TABLET | Freq: Two times a day (BID) | ORAL | 0 refills | Status: DC

## 2018-09-12 MED ORDER — BENZTROPINE MESYLATE 1 MG PO TABS: 1.0000 mg | ORAL_TABLET | Freq: Every day | ORAL | 0 refills | Status: DC

## 2018-09-12 MED ORDER — PERPHENAZINE 8 MG PO TABS: 24.0000 mg | ORAL_TABLET | Freq: Every day | ORAL | 0 refills | Status: DC

## 2018-09-12 MED ORDER — DEPAKOTE ER 250 MG PO TB24: 500.0000 mg | ORAL_TABLET | Freq: Two times a day (BID) | ORAL | 0 refills | Status: DC

## 2018-09-12 NOTE — Progress Notes (Signed)
BH MD/PA/NP OP Progress Note  09/12/2018 3:08 PM [name]  MRN:  [id number]  Chief Complaint: I have a lot of pain in my left wrist.  I fell and broke my arm and had a surgery.  HPI: Willaim came for his appointment.  He is taking his medication as prescribed.  He told he fell in January almost 6 weeks ago while climbing stairs and missed a step and broke his left arm.  He had a wrist fracture and had a surgery.  He is feeling better but he continues to have chronic pain.  He admitted lately more withdrawn and isolated but denies any suicidal thoughts.  He feels the current medicine working.  Recently had blood work and his hemoglobin A1c is high.  He was told to watch his calorie intake and lose weight.  He admitted careless eating on Christmas and Thanksgiving.  But now he is more vigilant about his eating habit.  He denies any mania, psychosis, hallucination.  He had a good Christmas as he was able to see his brother and his mother.  He is sleeping good.  He denies any suicidal thoughts.  He denies any feeling of hopelessness or worthlessness.  He has mild tremors but that does not interfere in his daily routine.  He is not interested in therapy.  Like to continue his current medication.  Visit Diagnosis:    ICD-10-CM   1. Major depressive disorder, recurrent episode, mild (HCC) F33.0 perphenazine (TRILAFON) 8 MG tablet    phenelzine (NARDIL) 15 MG tablet    benztropine (COGENTIN) 1 MG tablet    DEPAKOTE ER 250 MG 24 hr tablet  2. Schizoaffective disorder, depressive type (HCC) F25.1 benztropine (COGENTIN) 1 MG tablet    Past Psychiatric History: Reviewed. H/O multiple hospitalization from 1979-1985 due to severe depression, suicidal thoughts. No h/o suicidal attempt.  Diagnosed with OCD, schizoaffective disorder and major depression.  Tried numerous medication including TCAs.  We tried Lamictal but he develop a rash.  Past Medical History:  Past Medical History:  Diagnosis Date  .  A-fib ([place])   . Arrhythmia   . Chronic a-fib   . Closed fracture of left distal radius   . Depression   . Depression   . Hypothyroidism   . Low back pain   . Morbid obesity, BMI unknown ([place])   . Osteoarthritis    oa in bilateral knees  . Schizophrenia ([place])   . Seizures ([place])   . Thyroid disease   . Varicose veins     Past Surgical History:  Procedure Laterality Date  . ACHILLES TENDON REPAIR  approx. 2004   right foot  . CATARACT EXTRACTION     bilateral  . OPEN REDUCTION INTERNAL FIXATION (ORIF) DISTAL RADIAL FRACTURE Left 07/21/2018   Procedure: OPEN REDUCTION INTERNAL FIXATION (ORIF) DISTAL RADIAL FRACTURE;  Surgeon: [name], MD;  Location: [facility];  Service: Orthopedics;  Laterality: Left;  . stab phlebectomy  Right 11/09/2016   stab phlebectomy R leg by Tinnie Gens MD  . TOOTH EXTRACTION      Family Psychiatric History: Reviewed.  Family History:  Family History  Problem Relation Age of Onset  . Diabetes Father   . Heart disease Father   . Diabetes Brother   . OCD Mother     Social History:  Social History   Socioeconomic History  . Marital status: Single    Spouse name: Not on file  . Number of children: Not on  file  . Years of education: Not on file  . Highest education level: Not on file  Occupational History  . Occupation: disabled  Social Needs  . Financial resource strain: Not on file  . Food insecurity:    Worry: Not on file    Inability: Not on file  . Transportation needs:    Medical: Not on file    Non-medical: Not on file  Tobacco Use  . Smoking status: Never Smoker  . Smokeless tobacco: Never Used  Substance and Sexual Activity  . Alcohol use: No    Alcohol/week: 0.0 standard drinks  . Drug use: No  . Sexual activity: Never  Lifestyle  . Physical activity:    Days per week: Not on file    Minutes per session: Not on file  . Stress: Not on file  Relationships  . Social connections:    Talks on phone:  Not on file    Gets together: Not on file    Attends religious service: Not on file    Active member of club or organization: Not on file    Attends meetings of clubs or organizations: Not on file    Relationship status: Not on file  Other Topics Concern  . Not on file  Social History Narrative  . Not on file    Allergies:  Allergies  Allergen Reactions  . Lamictal [Lamotrigine] Rash  . Metformin And Related Rash    Metabolic Disorder Labs: Lab Results  Component Value Date   HGBA1C 6.9 09/09/2018   MPG 146 (H) 04/24/2015   MPG 140 02/05/2007   No results found for: PROLACTIN Lab Results  Component Value Date   CHOL 206 (H) 07/16/2016   TRIG 299 (H) 07/16/2016   HDL 24 (L) 07/16/2016   CHOLHDL 8.6 (H) 07/16/2016   VLDL 60 (H) 07/16/2016   LDLCALC 122 (H) 07/16/2016   LDLCALC 112 04/30/2015   Lab Results  Component Value Date   TSH 2.240 09/09/2018   TSH 1.23 12/23/2015    Therapeutic Level Labs: No results found for: LITHIUM Lab Results  Component Value Date   VALPROATE 72 08/17/2017   VALPROATE 46 (L) 12/07/2016   No components found for:  CBMZ  Current Medications: Current Outpatient Medications  Medication Sig Dispense Refill  . allopurinol (ZYLOPRIM) 300 MG tablet Take 1 tablet (300 mg total) by mouth daily. 90 tablet 3  . apixaban (ELIQUIS) 5 MG TABS tablet Take 1 tablet (5 mg total) by mouth 2 (two) times daily. 60 tablet 11  . benztropine (COGENTIN) 1 MG tablet Take 1 tablet (1 mg total) by mouth daily. 90 tablet 0  . DEPAKOTE ER 250 MG 24 hr tablet Take 2 tab in am and 2 at bed time (Patient taking differently: Take 500 mg by mouth 2 (two) times daily. ) 120 tablet 2  . ketoconazole (NIZORAL) 2 % cream Apply 1 application topically daily. 15 g 0  . levothyroxine (SYNTHROID, LEVOTHROID) 100 MCG tablet Take 1 tablet (100 mcg total) by mouth daily. 90 tablet 3  . oxyCODONE-acetaminophen (PERCOCET) 5-325 MG tablet 1-2 tabs PO q6 hours prn pain 30  tablet 0  . perphenazine (TRILAFON) 8 MG tablet TAKE 3 TABLETS BY MOUTH EVERY DAY AT BEDTIME (Patient taking differently: Take 24 mg by mouth at bedtime. ) 270 tablet 0  . phenelzine (NARDIL) 15 MG tablet Take 3 tablets (45 mg total) by mouth 2 (two) times daily. 540 tablet 0  . silver sulfADIAZINE (SILVADENE) 1 %  cream Apply 1 application topically daily as needed (for rash). 50 g 2  . triamcinolone ointment (KENALOG) 0.5 % Apply 1 application topically 2 (two) times daily. For moderate to severe eczema.  Do not use for more than 1 week at a time. 60 g 3   No current facility-administered medications for this visit.      Musculoskeletal: Strength & Muscle Tone: within normal limits Gait & Station: normal Patient leans: N/A  Psychiatric Specialty Exam: ROS  Blood pressure 138/80, pulse 74, height 5' 10.5" (1.791 m), weight 265 lb (120.2 kg).Body mass index is 37.49 kg/m.  General Appearance: Fairly Groomed  Eye Contact:  Fair  Speech:  Clear and Coherent and Slow  Volume:  Normal  Mood:  Anxious  Affect:  Congruent  Thought Process:  Descriptions of Associations: Intact  Orientation:  Full (Time, Place, and Person)  Thought Content: Logical   Suicidal Thoughts:  No  Homicidal Thoughts:  No  Memory:  Immediate;   Good Recent;   Good Remote;   Good  Judgement:  Good  Insight:  Good  Psychomotor Activity:  Tremor  Concentration:  Concentration: Fair and Attention Span: Fair  Recall:  Good  Fund of Knowledge: Good  Language: Good  Akathisia:  No  Handed:  Right  AIMS (if indicated): not done  Assets:  Communication Skills Desire for Improvement Housing Resilience Social Support  ADL's:  Intact  Cognition: WNL  Sleep:  Good   Screenings: PHQ2-9     Office Visit from 04/22/2018 in [facility] [facility] Office Visit from 02/23/2018 in [facility] Office Visit from 11/15/2017 in [facility] Office Visit from  04/15/2017 in [facility] Office Visit from 12/23/2015 in [facility]  PHQ-2 Total Score  0  0  0  0  0       Assessment and Plan: Major depressive disorder, recurrent.  Schizoaffective disorder, depressed type.  I reviewed his blood work results.  His hemoglobin A1c is 6.9 which is higher than before.  He is watching his calorie now and try to lose weight.  He like to continue his medication.  He understand very well MOI inhibitors and dietary restrictions.  I will continue Nardil 45 mg twice a day, Trilafon 24 mg at bedtime, Cogentin 1 mg daily, Depakote 500 mg twice a day.  We do not have Depakote level and we will do Depakote level today.  He is not interested in therapy.  Recommended to call us back if is any question or any concern.  Follow-up in 3 months.     Kathlee Nations, MD 09/12/2018, 3:08 PM

## 2018-09-13 DIAGNOSIS — S52572D Other intraarticular fracture of lower end of left radius, subsequent encounter for closed fracture with routine healing: Secondary | ICD-10-CM | POA: Diagnosis not present

## 2018-09-13 DIAGNOSIS — Z79899 Other long term (current) drug therapy: Secondary | ICD-10-CM | POA: Diagnosis not present

## 2018-09-14 ENCOUNTER — Other Ambulatory Visit (HOSPITAL_COMMUNITY): Payer: Self-pay

## 2018-09-14 DIAGNOSIS — Z79899 Other long term (current) drug therapy: Secondary | ICD-10-CM

## 2018-09-14 DIAGNOSIS — S52572D Other intraarticular fracture of lower end of left radius, subsequent encounter for closed fracture with routine healing: Secondary | ICD-10-CM | POA: Diagnosis not present

## 2018-09-14 DIAGNOSIS — M25632 Stiffness of left wrist, not elsewhere classified: Secondary | ICD-10-CM | POA: Diagnosis not present

## 2018-09-14 DIAGNOSIS — M25532 Pain in left wrist: Secondary | ICD-10-CM | POA: Diagnosis not present

## 2018-09-16 LAB — VALPROIC ACID LEVEL: Valproic Acid Lvl: 62 ug/mL (ref 50–100)

## 2018-09-21 DIAGNOSIS — M25632 Stiffness of left wrist, not elsewhere classified: Secondary | ICD-10-CM | POA: Diagnosis not present

## 2018-09-21 DIAGNOSIS — M25532 Pain in left wrist: Secondary | ICD-10-CM | POA: Diagnosis not present

## 2018-09-21 DIAGNOSIS — S52572D Other intraarticular fracture of lower end of left radius, subsequent encounter for closed fracture with routine healing: Secondary | ICD-10-CM | POA: Diagnosis not present

## 2018-10-10 DIAGNOSIS — S52572D Other intraarticular fracture of lower end of left radius, subsequent encounter for closed fracture with routine healing: Secondary | ICD-10-CM | POA: Diagnosis not present

## 2018-10-10 DIAGNOSIS — M25632 Stiffness of left wrist, not elsewhere classified: Secondary | ICD-10-CM | POA: Diagnosis not present

## 2018-10-10 DIAGNOSIS — R29898 Other symptoms and signs involving the musculoskeletal system: Secondary | ICD-10-CM | POA: Diagnosis not present

## 2018-10-10 DIAGNOSIS — M79645 Pain in left finger(s): Secondary | ICD-10-CM | POA: Diagnosis not present

## 2018-10-19 ENCOUNTER — Telehealth: Payer: Self-pay | Admitting: *Deleted

## 2018-10-19 NOTE — Telephone Encounter (Signed)
Pt states that the redness of his face has gotten worse since he ran out of the medication (ketoconazole).  He is requesting a refill unless Dr. Pilar Plate wants to "change the med or wants to have a virtual visit so he can see the redness"  Christen Bame, CMA

## 2018-10-20 ENCOUNTER — Other Ambulatory Visit (HOSPITAL_COMMUNITY): Payer: Self-pay | Admitting: Psychiatry

## 2018-10-20 DIAGNOSIS — F330 Major depressive disorder, recurrent, mild: Secondary | ICD-10-CM

## 2018-10-21 ENCOUNTER — Other Ambulatory Visit (HOSPITAL_COMMUNITY): Payer: Self-pay | Admitting: Psychiatry

## 2018-10-21 DIAGNOSIS — F330 Major depressive disorder, recurrent, mild: Secondary | ICD-10-CM

## 2018-10-24 ENCOUNTER — Telehealth (INDEPENDENT_AMBULATORY_CARE_PROVIDER_SITE_OTHER): Payer: Medicare Other | Admitting: Family Medicine

## 2018-10-24 ENCOUNTER — Other Ambulatory Visit: Payer: Self-pay

## 2018-10-24 ENCOUNTER — Other Ambulatory Visit: Payer: Self-pay | Admitting: Family Medicine

## 2018-10-24 ENCOUNTER — Ambulatory Visit: Payer: Self-pay | Admitting: Family Medicine

## 2018-10-24 DIAGNOSIS — L719 Rosacea, unspecified: Secondary | ICD-10-CM | POA: Diagnosis present

## 2018-10-24 DIAGNOSIS — L718 Other rosacea: Secondary | ICD-10-CM

## 2018-10-24 MED ORDER — METRONIDAZOLE 1 % EX GEL: Freq: Every day | CUTANEOUS | 0 refills | Status: DC

## 2018-10-24 NOTE — Progress Notes (Signed)
[facility] Telemedicine Visit  Patient consented to have virtual visit. Method of visit: Video  Encounter participants: Patient: [name] - located at home Provider: Patriciaann Clan - located at [facility] office  Others (if applicable): None   Chief Complaint: rash   HPI:  [name] is a 58 year old gentleman who presents via video encounter to discuss the following:   Facial Rash: Acute on Chronic.  Patient endorses worsening of facial rash, states the area has become more red, present mostly on his right cheek.  Rash been present intermittently for the past several months, but feels it worsened slowly over the past month.  Has additional erythema on his chin, nose.  Denies any flaking, peeling.  Endorses some "pimples" in the red region on his right cheek, skin is slightly thickened/raised in that area.  No associated itchiness or pain, however states the skin on his nose is a little tender if he pinches his nostrils.  Denies any new medications.  Has limited sun exposure. Not stressed above baseline. Has been using Eucerin lotion daily.  Previously seen by Dr. Pilar Plate and Dr. Gwendlyn Deutscher, diagnosed with Rosacea and seborrhea dermatitis in the past.  Recently saw Dr. Pilar Plate early last month, started ketoconazole cream. This lasted for about 2 weeks, used once daily, and did not feel that it made much of a difference.  Also prescribed MetroGel in the past, can remember if he used it or if it helped. He denies any associated fevers, rashes elsewhere, fatigue, or joint pains.     ROS: per HPI  Pertinent PMHx: Seborrheic dermatitis, rosacea  Exam: Via video encounter, low quality video. General: Alert older gentleman, no acute distress Derm: Slight erythema noted to chin, tip of nose, and right cheek, sparing the nasolabial fold.  Not well demarcated. No erythema over nasal bridge. Tip of nose appears slightly swollen, thickened. Patient reports areas are non-tender when  palpating, right cheek area raised. Scattered papules noted within erythematous cheek region, unable to characterize papules. Respiratory: No increased work of breathing, able to speak in full sentences Psych: Appropriate affect, can hold normal conversation  Assessment/Plan:  Rosacea Clinical presentation likely secondary to rosacea flare, with associated worsening nonpruritic, nonpainful erythema of his right cheek associated with papules, thickened and raised skin. No concerning systemic symptomatology.  Previously diagnosed with concurrent seborrheic dermatitis of his face, however without symptomatology or evidence of flaking or scaling, believe this is less likely contributing currently.  Considered differing etiology, such as sunburn, contact dermatitis, cellulitis, medication reaction, however reassured by history provided per patient that these are unlikely.  - Trial MetroGel 1% daily - Encourage continued routine skin care with daily Eucerin lotion and limited sun exposure, SPF if in sun - Follow-up in the next 2-3 weeks if not improving, or sooner if worsening  Discussed with Dr. Lunette Stands.   Time spent during visit with patient: 15 minutes  Patriciaann Clan, DO

## 2018-10-24 NOTE — Assessment & Plan Note (Addendum)
Clinical presentation likely secondary to rosacea flare, with associated worsening nonpruritic, nonpainful erythema of his right cheek associated with papules, thickened and raised skin. No concerning systemic symptomatology.  Previously diagnosed with concurrent seborrheic dermatitis of his face, however without symptomatology or evidence of flaking or scaling, believe this is less likely contributing currently.  Considered differing etiology, such as sunburn, contact dermatitis, cellulitis, medication reaction, however reassured by history provided per patient that these are unlikely.  - Trial MetroGel 1% daily - Encourage continued routine skin care with daily Eucerin lotion and limited sun exposure, SPF if in sun - Follow-up in the next 2-3 weeks if not improving, or sooner if worsening

## 2018-10-25 ENCOUNTER — Other Ambulatory Visit: Payer: Self-pay | Admitting: Family Medicine

## 2018-10-25 DIAGNOSIS — L719 Rosacea, unspecified: Secondary | ICD-10-CM

## 2018-10-26 ENCOUNTER — Encounter: Payer: Self-pay | Admitting: Family Medicine

## 2018-10-31 ENCOUNTER — Other Ambulatory Visit: Payer: Self-pay

## 2018-10-31 ENCOUNTER — Ambulatory Visit (INDEPENDENT_AMBULATORY_CARE_PROVIDER_SITE_OTHER): Payer: Medicare Other | Admitting: Family Medicine

## 2018-10-31 VITALS — BP 120/74 | HR 78 | Temp 97.8°F | Ht 69.0 in | Wt 262.5 lb

## 2018-10-31 DIAGNOSIS — L719 Rosacea, unspecified: Secondary | ICD-10-CM

## 2018-10-31 NOTE — Progress Notes (Signed)
  Subjective:   Patient ID: JOANNE BRANDER    DOB: [birth year], 58 y.o. male   MRN: [id number]  ORPHEUS HAYHURST is a 58 y.o. male with a history of T2DM, hypothyroidism, rosacea, seizures, depression, obesity, ED, schizoaffective d/o here for   Rash Red pustular rash present on cheeks (R>L) and nose since January. Reports has had issues with rash for years in nasolabial folds, previously been diagnosed with seborrheic dermatitis. Most concerning is the pimples and bleeding that occurs. Has tried ketoconazole cream but doesn't think this is working. Had telemedicine visit 4/20 for same, thought to be consistent with rosacea, was prescribed metrogel but couldn't afford. Has taken triamcinolone in the past but isn't sure what this was for. Denies new medications or antibiotics, fevers, new ticks/insects/pet exposure, new soaps or detergents. The rash does not itch nor is it painful. Denies facial swelling.  Review of Systems:  Per HPI.  [place], medications and smoking status reviewed.  Objective:   BP 120/74   Pulse 78   Temp 97.8 F (36.6 C) (Oral)   Ht 5\' 9"  (1.753 m)   Wt 262 lb 8 oz (119.1 kg)   SpO2 94%   BMI 38.76 kg/m  Vitals and nursing note reviewed.  General: obese male, in no acute distress with non-toxic appearance HEENT: normocephalic, atraumatic, moist mucous membranes Skin: warm, dry. Erythematous patches to bilateral cheeks (R>L) with pustules noted to bilateral cheeks and nose. Some inflammatory nodules noted to forehead. Extremities: warm and well perfused, normal tone MSK: ROM grossly intact, strength intact, gait normal Neuro: Alert and oriented, speech normal      Assessment & Plan:   Rosacea Agree with previous documentation at 4/20 telemedicine visit. History and exam most consistent with rosacea flare with erythematous thickening of skin with pustules in central location. Nasolabial folds spared without recent flaking or scaling, less likely seborrheic  dermatitis flare. As metrogel was too expensive, will trial metronidazole cream, already sent to pharmacy by PCP. Again discussed importance of UV protection with sunscreen to face and gentle daily cleansing regimen for face.  Return precautions given.  No orders of the defined types were placed in this encounter.  No orders of the defined types were placed in this encounter.   Rory Percy, DO PGY-2, [facility] 11/01/2018 1:21 PM

## 2018-10-31 NOTE — Patient Instructions (Signed)
It was great to see you!  Our plans for today:  - Call the pharmacy, you have metronidazole cream available there. Let us know if this is not well covered by your insurance. - Use gentle soap and water to clean your face each day, avoiding scrubbing too hard. Make sure you wear sun screen to protect your face from the sun.  Take care and seek immediate care sooner if you develop any concerns.   Dr. [facility]

## 2018-11-01 NOTE — Assessment & Plan Note (Signed)
Agree with previous documentation at 4/20 telemedicine visit. History and exam most consistent with rosacea flare with erythematous thickening of skin with pustules in central location. Nasolabial folds spared without recent flaking or scaling, less likely seborrheic dermatitis flare. As metrogel was too expensive, will trial metronidazole cream, already sent to pharmacy by PCP. Again discussed importance of UV protection with sunscreen to face and gentle daily cleansing regimen for face.  Return precautions given.

## 2018-11-03 NOTE — Telephone Encounter (Signed)
Refills sent to pharmacy. 

## 2018-11-19 ENCOUNTER — Other Ambulatory Visit (HOSPITAL_COMMUNITY): Payer: Self-pay | Admitting: Psychiatry

## 2018-11-19 DIAGNOSIS — F330 Major depressive disorder, recurrent, mild: Secondary | ICD-10-CM

## 2018-11-22 ENCOUNTER — Ambulatory Visit: Payer: Medicare Other | Admitting: Family Medicine

## 2018-11-24 ENCOUNTER — Encounter: Payer: Self-pay | Admitting: Family Medicine

## 2018-11-24 ENCOUNTER — Other Ambulatory Visit: Payer: Self-pay

## 2018-11-24 ENCOUNTER — Ambulatory Visit (INDEPENDENT_AMBULATORY_CARE_PROVIDER_SITE_OTHER): Payer: Medicare Other | Admitting: Family Medicine

## 2018-11-24 VITALS — BP 122/90 | Ht 69.0 in | Wt 261.5 lb

## 2018-11-24 DIAGNOSIS — F330 Major depressive disorder, recurrent, mild: Secondary | ICD-10-CM

## 2018-11-24 DIAGNOSIS — M109 Gout, unspecified: Secondary | ICD-10-CM | POA: Diagnosis not present

## 2018-11-24 DIAGNOSIS — L219 Seborrheic dermatitis, unspecified: Secondary | ICD-10-CM | POA: Diagnosis not present

## 2018-11-24 DIAGNOSIS — R569 Unspecified convulsions: Secondary | ICD-10-CM

## 2018-11-24 DIAGNOSIS — L719 Rosacea, unspecified: Secondary | ICD-10-CM

## 2018-11-24 DIAGNOSIS — W19XXXA Unspecified fall, initial encounter: Secondary | ICD-10-CM | POA: Insufficient documentation

## 2018-11-24 DIAGNOSIS — Y92009 Unspecified place in unspecified non-institutional (private) residence as the place of occurrence of the external cause: Secondary | ICD-10-CM

## 2018-11-24 MED ORDER — DEPAKOTE ER 250 MG PO TB24: 500.0000 mg | ORAL_TABLET | Freq: Two times a day (BID) | ORAL | 0 refills | Status: DC

## 2018-11-24 MED ORDER — AZELAIC ACID 20 % EX CREA: TOPICAL_CREAM | Freq: Two times a day (BID) | CUTANEOUS | 0 refills | Status: DC

## 2018-11-24 MED ORDER — ALLOPURINOL 300 MG PO TABS: 300.0000 mg | ORAL_TABLET | Freq: Every day | ORAL | 3 refills | Status: DC

## 2018-11-24 MED ORDER — KETOCONAZOLE 2 % EX CREA: TOPICAL_CREAM | Freq: Every day | CUTANEOUS | 1 refills | Status: DC

## 2018-11-24 NOTE — Patient Instructions (Addendum)
To active problems going on right now that we need to treat.  I have outlined to him below:  Seborrheic dermatitis-this is was causing the significant flaking of your face, forehead and hairline.  It is important to continue using the fluconazole cream once daily.  Additionally, start using 1 of the shampoos that we discussed ([place] Blue, head and shoulders) once daily.  When using this shampoo, you can lather your head put on your face as well, letting it sit there for about 5 minutes before rinsing out.  Rosacea-this is causing the redness and bumps on your cheeks and the front of your nose.  To treat this, stop using the metronidazole cream and begin using azelaic acid.  Apply the azelaic acid once daily to your cheeks and nose after you shower.  I have included some general guidelines below about rosacea.  Rosacea Rosacea is a long-term (chronic) condition that affects the skin of the face, including the cheeks, nose, forehead, and chin. This condition can also affect the eyes. Rosacea causes blood vessels near the surface of the skin to enlarge, which results in redness. What are the causes? The cause of this condition is not known. Certain triggers can make rosacea worse, including:  Hot baths.  Exercise.  Sunlight.  Very hot or cold temperatures.  Hot or spicy foods and drinks.  Drinking alcohol.  Stress.  Taking blood pressure medicine.  Long-term use of topical steroids on the face. What increases the risk? You are more likely to develop this condition if you:  Are older than 60 years of age.  Are a woman.  Have light-colored skin (light complexion).  Have a family history of rosacea. What are the signs or symptoms? Symptoms of this condition include:  Redness of the face.  Red bumps or pimples on the face.  A red, enlarged nose.  Blushing easily.  Red lines on the skin.  Irritated, burning, or itchy feeling in the eyes.  Swollen eyelids.  Drainage  from the eyes.  Feeling like there is something in your eye. How is this diagnosed? This condition is diagnosed with a medical history and physical exam. How is this treated? There is no cure for this condition, but treatment can help to control your symptoms. Your health care provider may recommend that you see a skin specialist (dermatologist). Treatment may include:  Medicines that are applied to the skin or taken by mouth (orally). This can include antibiotic medicines.  Laser treatment to improve the appearance of the skin.  Surgery. This is rare. Your health care provider will also recommend the best way to take care of your skin. Even after your skin improves, you will likely need to continue treatment to prevent your rosacea from coming back. Follow these instructions at home: Skin care Take care of your skin as told by your health care provider. You may be told to do these things:  Wash your skin gently two or more times each day.  Use mild soap.  Use a sunscreen or sunblock with SPF 30 or greater.  Use gentle cosmetics that are meant for sensitive skin.  Shave with an electric shaver instead of a blade. Lifestyle  Try to keep track of what foods trigger this condition. Avoid any triggers. These may include: ? Spicy foods. ? Seafood. ? Cheese. ? Hot liquids. ? Nuts. ? Chocolate. ? Iodized salt.  Do not drink alcohol.  Avoid extremely cold or hot temperatures.  Try to reduce your stress. If you  need help, talk with your health care provider.  When you exercise, do these things to stay cool: ? Limit sun exposure to your face. ? Use a fan. ? Do shorter and more frequent intervals of exercise. General instructions  Take and apply over-the-counter and prescription medicines only as told by your health care provider.  If you were prescribed an antibiotic medicine, apply it or take it as told by your health care provider. Do not stop using the antibiotic even if  your condition improves.  If your eyelids are affected, apply warm compresses to them. Do this as told by your health care provider.  Keep all follow-up visits as told by your health care provider. This is important. Contact a health care provider if:  Your symptoms get worse.  Your symptoms do not improve after 2 months of treatment.  You have new symptoms.  You have any changes in vision or you have problems with your eyes, such as redness or itching.  You feel depressed.  You lose your appetite.  You have trouble concentrating. Summary  Rosacea is a long-term (chronic) condition that affects the skin of the face, including the cheeks, nose, forehead, and chin.  Take care of your skin as told by your health care provider.  Take and apply over-the-counter and prescription medicines only as told by your health care provider.  Contact a health care provider if your symptoms get worse or if you have any changes in vision or other problems with your eyes, such as redness or itching.  Keep all follow-up visits as told by your health care provider. This is important. This information is not intended to replace advice given to you by your health care provider. Make sure you discuss any questions you have with your health care provider. Document Released: 07/30/2004 Document Revised: 11/24/2017 Document Reviewed: 11/24/2017 Elsevier Interactive Patient Education  2019 [employer].

## 2018-11-24 NOTE — Assessment & Plan Note (Signed)
-  Refilled valproic acid for 45 days -Encouraged him to follow-up with the providers prescribing this medication

## 2018-11-24 NOTE — Progress Notes (Signed)
    Subjective:  [name] is a 59 y.o. male who presents to the [facility] today with a chief complaint of worsening skin flaking.   HPI: Seborrheic dermatitis Was previously seen on 4/27.  Significant worsening today compared to the photo from 4/27.  He was significant dryness, flaking and erythema the nasolabial folds, brow line, hairline and scalp.  Similar presentation to his visit on 3/6.  He reports that he has not been eating using the ketoconazole cream and is primarily been using his rosacea treatment.  Acne rosacea Minor improvement to the redness over the front of his nose.  He notes that he does sometimes experience bumps with bleeding although he is not currently showing any bumps bleeding in his nose or cheeks.  He thinks that his cheeks seem about the same compared to his last visit.  He has been using metronidazole cream daily.  Behavioral health medicine He reports that he is running out of some of his medication which is typically monitored by his behavioral health doctor.  Reports that he has had trouble contacting the office and needs refills of his allopurinol and valproic acid.  Recent fall He reported a fall at home 4 days ago.  During his fall, he landed on his back/hip.  He denied hitting his head or losing consciousness.  He reports that he is currently having mild pain/bruising in his back and hips.  He reports that he is walking with only mild discomfort and can ambulate normally.  Chief Complaint noted Review of Symptoms - see HPI PMH - Smoking status noted.    Objective:  Physical Exam: BP 122/90   Ht 5\' 9"  (1.753 m)   Wt 261 lb 8 oz (118.6 kg)   BMI 38.62 kg/m    Gen: NAD, resting comfortably HEENT: Increased yellow flaking skin over her hairline, brow line, nasolabial folds and chin.  Increased erythema over previously mentioned locations.  No areas of increased erythema or drainage suspicious for infection.  Cheeks and bridge of nose notable for  increased erythema with nodules.  No active bleeding, papules, drainage.  Media Information   Document Information   Photos    11/24/2018 13:54  Attached To:  Office Visit on 11/24/18 with Matilde Haymaker, MD  Source Information   Matilde Haymaker, MD  [facility]     No results found for this or any previous visit (from the past 3 hour(s)).   Assessment/Plan:  Rosacea -Stop metronidazole cream -Start azelaic acid cream once daily after showers -Follow-up in 1 month  Seborrheic dermatitis -Refill ketoconazole 2% cream, apply once daily after shower.  The importance of this medication was reiterated. -Lather scalp and face with shampoo (Selsun Blue or head and shoulders) elicit for 5 minutes and rinse out -Follow-up in 1 month  Fall at home -Continue NSAIDs and Tylenol at home -Follow-up if new symptoms arise  Seizures -Refilled valproic acid for 45 days -Encouraged him to follow-up with the providers prescribing this medication

## 2018-11-24 NOTE — Assessment & Plan Note (Signed)
-  Stop metronidazole cream -Start azelaic acid cream once daily after showers -Follow-up in 1 month

## 2018-11-24 NOTE — Assessment & Plan Note (Signed)
-  Continue NSAIDs and Tylenol at home -Follow-up if new symptoms arise

## 2018-11-24 NOTE — Assessment & Plan Note (Signed)
-  Refill ketoconazole 2% cream, apply once daily after shower.  The importance of this medication was reiterated. -Lather scalp and face with shampoo (Selsun Blue or head and shoulders) elicit for 5 minutes and rinse out -Follow-up in 1 month

## 2018-11-29 ENCOUNTER — Telehealth: Payer: Self-pay | Admitting: *Deleted

## 2018-11-29 NOTE — Telephone Encounter (Signed)
Received fax from pharmacy, PA needed on Azelex cream.  Clinical questions submitted via Cover My Meds.  Waiting on response, could take up to 72 hours.  Cover My Meds info: Key: [id number]   Christen Bame, CMA

## 2018-11-30 NOTE — Telephone Encounter (Signed)
Med approved 08/31/2018 - 11/29/2019, pharmacy informed.  Christen Bame, CMA

## 2018-12-06 ENCOUNTER — Other Ambulatory Visit: Payer: Self-pay

## 2018-12-06 MED ORDER — LEVOTHYROXINE SODIUM 100 MCG PO TABS: 100.0000 ug | ORAL_TABLET | Freq: Every day | ORAL | 3 refills | Status: DC

## 2018-12-16 ENCOUNTER — Other Ambulatory Visit: Payer: Self-pay | Admitting: Family Medicine

## 2018-12-16 ENCOUNTER — Encounter: Payer: Self-pay | Admitting: Family Medicine

## 2018-12-16 ENCOUNTER — Other Ambulatory Visit: Payer: Self-pay

## 2018-12-16 ENCOUNTER — Ambulatory Visit (INDEPENDENT_AMBULATORY_CARE_PROVIDER_SITE_OTHER): Payer: Medicare Other | Admitting: Family Medicine

## 2018-12-16 VITALS — BP 128/78 | HR 78 | Wt 260.0 lb

## 2018-12-16 DIAGNOSIS — L719 Rosacea, unspecified: Secondary | ICD-10-CM | POA: Diagnosis not present

## 2018-12-16 DIAGNOSIS — R21 Rash and other nonspecific skin eruption: Secondary | ICD-10-CM

## 2018-12-16 DIAGNOSIS — E119 Type 2 diabetes mellitus without complications: Secondary | ICD-10-CM | POA: Diagnosis not present

## 2018-12-16 DIAGNOSIS — F330 Major depressive disorder, recurrent, mild: Secondary | ICD-10-CM

## 2018-12-16 LAB — POCT GLYCOSYLATED HEMOGLOBIN (HGB A1C): HbA1c, POC (controlled diabetic range): 6.6 % (ref 0.0–7.0)

## 2018-12-16 MED ORDER — AZELEX 20 % EX CREA: TOPICAL_CREAM | Freq: Two times a day (BID) | CUTANEOUS | 0 refills | Status: DC

## 2018-12-16 NOTE — Patient Instructions (Signed)
I will call you next week with your labs.   The dermatologist should call you with an appt time. You can use the cream until then.   The knot on your back is called a lipoma, nothing to worry about.   Diet Recommendations for Diabetes   Starchy (carb) foods: Bread, rice, pasta, potatoes, corn, cereal, grits, crackers, bagels, muffins, all baked goods.  (Fruits, milk, and yogurt also have carbohydrate, but most of these foods will not spike your blood sugar as most starchy foods will.)  A few fruits do cause high blood sugars; use small portions of bananas (limit to 1/2 at a time), grapes, watermelon, oranges, and most tropical fruits.    Protein foods: Meat, fish, poultry, eggs, dairy foods, and beans such as pinto and kidney beans (beans also provide carbohydrate).   1. Eat at least 3 meals and 1-2 snacks per day. Never go more than 4-5 hours while awake without eating. Eat breakfast within the first hour of getting up.   2. Limit starchy foods to TWO per meal and ONE per snack. ONE portion of a starchy  food is equal to the following:   - ONE slice of bread (or its equivalent, such as half of a hamburger bun).   - 1/2 cup of a "scoopable" starchy food such as potatoes or rice.   - 15 grams of Total Carbohydrate as shown on food label.  3. Include at every meal: a protein food, a carb food, and vegetables and/or fruit.   - Obtain twice the volume of veg's as protein or carbohydrate foods for both lunch and dinner.   - Fresh or frozen veg's are best.   - Keep frozen veg's on hand for a quick vegetable serving.

## 2018-12-16 NOTE — Progress Notes (Signed)
   CC: DM, facial redness  HPI  DM - has been cutting back on sugar more, but he has not been watching his carbs as much.   Rash - on his face, thinks its rosacea. azelex seems to be working. Needs a refill. He still has quite prominent redness of the R facial cheek. Says he had cyrotherapy of the R temple years ago, no surgery or trauma to R facial cheek.  Back knot - fell several weeks ago. Noticed a lump under skin. No discharge.   ROS: Denies CP, SOB, abdominal pain, dysuria, changes in BMs.   CC, SH/smoking status, and VS noted  Objective: BP 128/78   Pulse 78   Wt 260 lb (117.9 kg)   SpO2 94%   BMI 38.40 kg/m  Gen: NAD, alert, cooperative, and pleasant. HEENT: NCAT, EOMI, PERRL. No picture taken today due to COVID precautions, but R facial cheek is much more vascular appearing and violaceous. Paranasal scaling vastly improved.    CV: RRR, no murmur Resp: CTAB, no wheezes, non-labored Back: 1-2cm lipoma on R paralumbar area.  Neuro: Alert and oriented, Speech clear, No gross deficits  Assessment and plan:  T2DM (type 2 diabetes mellitus) ([place]) Improved control with lifestyle changes, gave DM diet info regarding carb modified diet. Encouraged him on progress. Recheck 3 months.   HLD: recheck lipids today.   Facial skin lesion: the R facial cheek is quite violaceous, refer to derm. Can use rosacea cream until their eval.   Lipoma - reassured.   Orders Placed This Encounter  Procedures  . Lipid panel  . Basic metabolic panel  . Ambulatory referral to Dermatology    Referral Priority:   Routine    Referral Type:   Consultation    Referral Reason:   Specialty Services Required    Requested Specialty:   Dermatology    Number of Visits Requested:   1  . HgB A1c    Meds ordered this encounter  Medications  . azelaic acid (AZELEX) 20 % cream    Sig: Apply topically 2 (two) times daily. After skin is thoroughly washed and patted dry, gently but thoroughly massage  a thin film of azelaic acid cream into the affected area twice daily, in the morning and evening.    Dispense:  30 g    Refill:  0    Ralene Ok, MD, PGY3 12/16/2018 2:39 PM

## 2018-12-16 NOTE — Assessment & Plan Note (Signed)
Improved control with lifestyle changes, gave DM diet info regarding carb modified diet. Encouraged him on progress. Recheck 3 months.

## 2018-12-17 LAB — LIPID PANEL
Chol/HDL Ratio: 6.7 ratio — ABNORMAL HIGH (ref 0.0–5.0)
Cholesterol, Total: 182 mg/dL (ref 100–199)
HDL: 27 mg/dL — ABNORMAL LOW (ref >39)
Triglycerides: 487 mg/dL — ABNORMAL HIGH (ref 0–149)

## 2018-12-17 LAB — BASIC METABOLIC PANEL
BUN/Creatinine Ratio: 14 (ref 10–24)
BUN: 15 mg/dL (ref 8–27)
CO2: 24 mmol/L (ref 20–29)
Calcium: 9.3 mg/dL (ref 8.6–10.2)
Chloride: 98 mmol/L (ref 96–106)
Creatinine, Ser: 1.05 mg/dL (ref 0.76–1.27)
GFR calc Af Amer: 89 mL/min/{1.73_m2} (ref >59)
GFR calc non Af Amer: 77 mL/min/{1.73_m2} (ref >59)
Glucose: 199 mg/dL — ABNORMAL HIGH (ref 65–99)
Potassium: 4.5 mmol/L (ref 3.5–5.2)
Sodium: 138 mmol/L (ref 134–144)

## 2018-12-19 ENCOUNTER — Telehealth: Payer: Self-pay | Admitting: Family Medicine

## 2018-12-19 NOTE — Telephone Encounter (Signed)
Called patient with lab results. He did not answer, left generic VM to return my call. If he calls back, I would like to discuss his elevated cholesterol and what to do about this.   For provider or PCP to discuss: Lipid panel shows elevated triglycerides, which are likely related to pre-diabetes. I asked Dr. Valentina Lucks, as some psych meds can affect lipid panel, but this is not likely the case with his listed psych meds.  We should consider starting a fiber if his triglycerides were over 500, which is not the case presently.  We should continue to encourage diet and exercise and consider starting 10 mg rosuvastatin for triglyceride reduction.  He could also start a fish oil such as Bassini but if his insurance will cover this.  We should also counsel him on any alcohol use as this can exacerbate his triglycerides.  If he does not reach me I would recommend that he has a virtual visit with his PCP sometime over the next month to discuss these lab results and plans going forward.  Those triglyceride findings suggest that his pancreas may not be functioning as well as his A1c suggests.

## 2018-12-22 ENCOUNTER — Other Ambulatory Visit (HOSPITAL_COMMUNITY): Payer: Self-pay | Admitting: Psychiatry

## 2018-12-22 DIAGNOSIS — F330 Major depressive disorder, recurrent, mild: Secondary | ICD-10-CM

## 2018-12-22 DIAGNOSIS — F251 Schizoaffective disorder, depressive type: Secondary | ICD-10-CM

## 2018-12-27 DIAGNOSIS — L718 Other rosacea: Secondary | ICD-10-CM | POA: Diagnosis not present

## 2018-12-27 DIAGNOSIS — L218 Other seborrheic dermatitis: Secondary | ICD-10-CM | POA: Diagnosis not present

## 2018-12-29 ENCOUNTER — Other Ambulatory Visit (HOSPITAL_COMMUNITY): Payer: Self-pay

## 2018-12-29 DIAGNOSIS — F330 Major depressive disorder, recurrent, mild: Secondary | ICD-10-CM

## 2018-12-29 DIAGNOSIS — F251 Schizoaffective disorder, depressive type: Secondary | ICD-10-CM

## 2018-12-29 MED ORDER — DEPAKOTE ER 250 MG PO TB24: 500.0000 mg | ORAL_TABLET | Freq: Two times a day (BID) | ORAL | 0 refills | Status: DC

## 2018-12-29 MED ORDER — PHENELZINE SULFATE 15 MG PO TABS: 45.0000 mg | ORAL_TABLET | Freq: Two times a day (BID) | ORAL | 0 refills | Status: DC

## 2018-12-29 MED ORDER — PERPHENAZINE 8 MG PO TABS: 24.0000 mg | ORAL_TABLET | Freq: Every day | ORAL | 0 refills | Status: DC

## 2018-12-29 MED ORDER — BENZTROPINE MESYLATE 1 MG PO TABS: 1.0000 mg | ORAL_TABLET | Freq: Every day | ORAL | 0 refills | Status: DC

## 2019-01-04 NOTE — Telephone Encounter (Signed)
Please reach out to Mr. Cape to ensure that he does not need this medication. I provided one month supply with the understanding that he would follow up with the provider who prescribes his Depakote.  If he requires a refill, he should follow up the provider who originally prescribed this medication.  Thank you, Matilde Haymaker, MD

## 2019-01-13 NOTE — Telephone Encounter (Signed)
Attempted to call pt to ensure that his depakote was fill by the original prescriber. No answer. VM not set up.  Matilde Haymaker, MD

## 2019-01-25 NOTE — Telephone Encounter (Signed)
Final attempt to call pt.  No answer. VM not set up.

## 2019-02-03 ENCOUNTER — Other Ambulatory Visit: Payer: Self-pay

## 2019-02-03 ENCOUNTER — Ambulatory Visit (INDEPENDENT_AMBULATORY_CARE_PROVIDER_SITE_OTHER): Payer: Medicare Other | Admitting: Psychiatry

## 2019-02-03 ENCOUNTER — Encounter (HOSPITAL_COMMUNITY): Payer: Self-pay | Admitting: Psychiatry

## 2019-02-03 DIAGNOSIS — F251 Schizoaffective disorder, depressive type: Secondary | ICD-10-CM

## 2019-02-03 DIAGNOSIS — F330 Major depressive disorder, recurrent, mild: Secondary | ICD-10-CM | POA: Diagnosis not present

## 2019-02-03 MED ORDER — PERPHENAZINE 8 MG PO TABS: 24.0000 mg | ORAL_TABLET | Freq: Every day | ORAL | 0 refills | Status: DC

## 2019-02-03 MED ORDER — DEPAKOTE ER 250 MG PO TB24: 500.0000 mg | ORAL_TABLET | Freq: Two times a day (BID) | ORAL | 0 refills | Status: DC

## 2019-02-03 MED ORDER — BENZTROPINE MESYLATE 1 MG PO TABS: 1.0000 mg | ORAL_TABLET | Freq: Every day | ORAL | 0 refills | Status: DC

## 2019-02-03 MED ORDER — PHENELZINE SULFATE 15 MG PO TABS: 45.0000 mg | ORAL_TABLET | Freq: Two times a day (BID) | ORAL | 0 refills | Status: DC

## 2019-02-03 NOTE — Progress Notes (Signed)
Virtual Visit via Telephone Note  I connected with [name] on 02/03/19 at 10:40 AM EDT by telephone and verified that I am speaking with the correct person using two identifiers.   I discussed the limitations, risks, security and privacy concerns of performing an evaluation and management service by telephone and the availability of in person appointments. I also discussed with the patient that there may be a patient responsible charge related to this service. The patient expressed understanding and agreed to proceed.   History of Present Illness: Patient was evaluated by phone session.  He is doing better on his current medication.  He is sleeping good.  He admitted some anxiety due to pandemic as he does not leave the house unless it is important.  He feels the current medicine is working and he does not have any paranoia, hallucination or any crying spells.  He is sleeping good.  Recently had a blood work and his hemoglobin A1c is slightly better than before.  He has mild tremors but it does not interfere in his daily routine.  His appetite is okay.  His energy level is good.  He reported his weight is stable.  He is concerned about his high triglyceride and hoping to change his diet plan.  He is not interested in therapy.  He like to continue his current medication.   Past Psychiatric History: Reviewed. H/O multiple hospitalization from 1979-1985 due to severe depression, suicidal thoughts. No h/o suicidal attempt. Diagnosed with OCD, schizoaffective disorder and major depression. Tried numerous medication including TCAs. We tried Lamictal but he develop a rash.  Recent Results (from the past 2160 hour(s))  HgB A1c     Status: None   Collection Time: 12/16/18 10:55 AM  Result Value Ref Range   Hemoglobin A1C     HbA1c POC (<> result, manual entry)     HbA1c, POC (prediabetic range)     HbA1c, POC (controlled diabetic range) 6.6 0.0 - 7.0 %  Lipid panel     Status: Abnormal   Collection Time: 12/16/18 11:16 AM  Result Value Ref Range   Cholesterol, Total 182 100 - 199 mg/dL   Triglycerides 487 (H) 0 - 149 mg/dL   HDL 27 (L) >39 mg/dL   VLDL Cholesterol Cal Comment 5 - 40 mg/dL    Comment: The calculation for the VLDL cholesterol is not valid when triglyceride level is >400 mg/dL.    LDL Calculated Comment 0 - 99 mg/dL    Comment: Triglyceride result indicated is too high for an accurate LDL cholesterol estimation.    Chol/HDL Ratio 6.7 (H) 0.0 - 5.0 ratio    Comment:                                   T. Chol/HDL Ratio                                             Men  Women                               1/2 Avg.Risk  3.4    3.3  Avg.Risk  5.0    4.4                                2X Avg.Risk  9.6    7.1                                3X Avg.Risk 23.4   24.2   Basic metabolic panel     Status: Abnormal   Collection Time: 12/16/18 11:16 AM  Result Value Ref Range   Glucose 199 (H) 65 - 99 mg/dL   BUN 15 8 - 27 mg/dL   Creatinine, Ser 1.05 0.76 - 1.27 mg/dL   GFR calc non Af Amer 77 >59 mL/min/1.73   GFR calc Af Amer 89 >59 mL/min/1.73   BUN/Creatinine Ratio 14 10 - 24   Sodium 138 134 - 144 mmol/L   Potassium 4.5 3.5 - 5.2 mmol/L   Chloride 98 96 - 106 mmol/L   CO2 24 20 - 29 mmol/L   Calcium 9.3 8.6 - 10.2 mg/dL   Psychiatric Specialty Exam: Physical Exam  ROS  There were no vitals taken for this visit.There is no height or weight on file to calculate BMI.  General Appearance: NA  Eye Contact:  NA  Speech:  Clear and Coherent and Slow  Volume:  Normal  Mood:  Anxious  Affect:  NA  Thought Process:  Descriptions of Associations: Intact  Orientation:  Full (Time, Place, and Person)  Thought Content:  WDL and Logical  Suicidal Thoughts:  No  Homicidal Thoughts:  No  Memory:  Immediate;   Good Recent;   Good Remote;   Good  Judgement:  Good  Insight:  Present  Psychomotor Activity:  NA  Concentration:   Concentration: Fair and Attention Span: Fair  Recall:  Good  Fund of Knowledge:  Good  Language:  Good  Akathisia:  mild tremors in hand  Handed:  Right  AIMS (if indicated):     Assets:  Communication Skills Desire for Improvement Housing Resilience  ADL's:  Intact  Cognition:  WNL  Sleep:   good      Assessment and Plan: Major depressive disorder, recurrent.  Schizoaffective disorder, depressed type.  I reviewed his blood work results.  His hemoglobin A1c is slightly improved from the past.  Encourage healthy lifestyle and regular exercise.  Patient does not want to change his medication.  He is on MAOI inhibitors for a long time and now his dietary restriction.  Continue Trilafon 24 mg at bedtime, Nardil 45 mg twice a day, Cogentin 1 mg daily and Depakote 500 mg twice a day.  His last Depakote level was 62 which was done in March.  Discussed medication side effects and benefits.  Recommended to call us back if is any question or any concern.  Follow-up in 3 months.  Follow Up Instructions:    I discussed the assessment and treatment plan with the patient. The patient was provided an opportunity to ask questions and all were answered. The patient agreed with the plan and demonstrated an understanding of the instructions.   The patient was advised to call back or seek an in-person evaluation if the symptoms worsen or if the condition fails to improve as anticipated.  I provided 20 minutes of non-face-to-face time during this encounter.   Kathlee Nations, MD

## 2019-02-21 ENCOUNTER — Other Ambulatory Visit (HOSPITAL_COMMUNITY): Payer: Self-pay

## 2019-02-21 DIAGNOSIS — F330 Major depressive disorder, recurrent, mild: Secondary | ICD-10-CM

## 2019-02-21 MED ORDER — DEPAKOTE ER 250 MG PO TB24: 500.0000 mg | ORAL_TABLET | Freq: Two times a day (BID) | ORAL | 0 refills | Status: DC

## 2019-02-27 DIAGNOSIS — L718 Other rosacea: Secondary | ICD-10-CM | POA: Diagnosis not present

## 2019-02-27 DIAGNOSIS — L218 Other seborrheic dermatitis: Secondary | ICD-10-CM | POA: Diagnosis not present

## 2019-04-28 ENCOUNTER — Other Ambulatory Visit: Payer: Self-pay

## 2019-04-28 ENCOUNTER — Emergency Department (HOSPITAL_BASED_OUTPATIENT_CLINIC_OR_DEPARTMENT_OTHER)
Admission: EM | Admit: 2019-04-28 | Discharge: 2019-04-28 | Disposition: A | Discharge status: Home / Self Care | Payer: Medicare Other | Attending: Emergency Medicine | Admitting: Emergency Medicine

## 2019-04-28 ENCOUNTER — Encounter (HOSPITAL_BASED_OUTPATIENT_CLINIC_OR_DEPARTMENT_OTHER): Payer: Self-pay | Admitting: Emergency Medicine

## 2019-04-28 ENCOUNTER — Emergency Department (HOSPITAL_BASED_OUTPATIENT_CLINIC_OR_DEPARTMENT_OTHER): Payer: Medicare Other

## 2019-04-28 DIAGNOSIS — R519 Headache, unspecified: Secondary | ICD-10-CM | POA: Diagnosis not present

## 2019-04-28 DIAGNOSIS — Z79899 Other long term (current) drug therapy: Secondary | ICD-10-CM | POA: Insufficient documentation

## 2019-04-28 DIAGNOSIS — S0990XA Unspecified injury of head, initial encounter: Secondary | ICD-10-CM | POA: Diagnosis not present

## 2019-04-28 DIAGNOSIS — Y999 Unspecified external cause status: Secondary | ICD-10-CM | POA: Insufficient documentation

## 2019-04-28 DIAGNOSIS — G40909 Epilepsy, unspecified, not intractable, without status epilepticus: Secondary | ICD-10-CM | POA: Insufficient documentation

## 2019-04-28 DIAGNOSIS — E039 Hypothyroidism, unspecified: Secondary | ICD-10-CM | POA: Diagnosis not present

## 2019-04-28 DIAGNOSIS — W06XXXA Fall from bed, initial encounter: Secondary | ICD-10-CM | POA: Diagnosis not present

## 2019-04-28 DIAGNOSIS — I482 Chronic atrial fibrillation, unspecified: Secondary | ICD-10-CM | POA: Diagnosis not present

## 2019-04-28 DIAGNOSIS — Y92003 Bedroom of unspecified non-institutional (private) residence as the place of occurrence of the external cause: Secondary | ICD-10-CM | POA: Diagnosis not present

## 2019-04-28 DIAGNOSIS — Z888 Allergy status to other drugs, medicaments and biological substances status: Secondary | ICD-10-CM | POA: Diagnosis not present

## 2019-04-28 DIAGNOSIS — Z7901 Long term (current) use of anticoagulants: Secondary | ICD-10-CM | POA: Insufficient documentation

## 2019-04-28 DIAGNOSIS — Y9384 Activity, sleeping: Secondary | ICD-10-CM | POA: Insufficient documentation

## 2019-04-28 NOTE — ED Triage Notes (Signed)
Patient states took 2 ibuprofen pta.

## 2019-04-28 NOTE — ED Triage Notes (Signed)
Patient presents with complaints of headache; states that he rolled out of the bed yesterday morning striking the nightstand with his head; states onset of headache later yesterday evening; denies loc; alert and oriented; ambulatory with steady gait.

## 2019-04-28 NOTE — ED Provider Notes (Signed)
[place] DEPT [facility] Provider Note: Georgena Spurling, MD, FACEP  CSN: [id number] MRN: [id number] ARRIVAL: 04/28/19 at [place]: [place]  Headache   HISTORY OF PRESENT ILLNESS  04/28/19 2:05 AM [name] is a 58 y.o. male who rolled out of bed during a dream yesterday morning striking his left parietal area on the nightstand.  He did not lose consciousness.  He has not been vomiting or had nausea.  He is having a headache centered around the location he struck.  He rates the pain as a 7 out of 10, worse with palpation.  The pain is aching in nature.  He also has felt lightheaded.  He denies other injury.  He is on Eliquis for chronic atrial fibrillation.  He has a known resting tremor.   Past Medical History:  Diagnosis Date  . A-fib ([place])   . Arrhythmia   . Chronic a-fib ([place])   . Closed fracture of left distal radius   . Depression   . Depression   . Hypothyroidism   . Low back pain   . Morbid obesity, BMI unknown ([place])   . Osteoarthritis    oa in bilateral knees  . Schizophrenia ([place])   . Seizures ([place])   . Thyroid disease   . Varicose veins     Past Surgical History:  Procedure Laterality Date  . ACHILLES TENDON REPAIR  approx. 2004   right foot  . CATARACT EXTRACTION     bilateral  . OPEN REDUCTION INTERNAL FIXATION (ORIF) DISTAL RADIAL FRACTURE Left 07/21/2018   Procedure: OPEN REDUCTION INTERNAL FIXATION (ORIF) DISTAL RADIAL FRACTURE;  Surgeon: [name], MD;  Location: [facility];  Service: Orthopedics;  Laterality: Left;  . stab phlebectomy  Right 11/09/2016   stab phlebectomy R leg by Tinnie Gens MD  . TOOTH EXTRACTION      Family History  Problem Relation Age of Onset  . Diabetes Father   . Heart disease Father   . Diabetes Brother   . OCD Mother     Social History   Tobacco Use  . Smoking status: Never Smoker  . Smokeless tobacco: Never Used  Substance Use Topics  . Alcohol use: No   Alcohol/week: 0.0 standard drinks  . Drug use: No    Prior to Admission medications   Medication Sig Start Date End Date Taking? Authorizing Provider  allopurinol (ZYLOPRIM) 300 MG tablet Take 1 tablet (300 mg total) by mouth daily. 11/24/18   Matilde Haymaker, MD  apixaban (ELIQUIS) 5 MG TABS tablet Take 1 tablet (5 mg total) by mouth 2 (two) times daily. 04/25/18   Matilde Haymaker, MD  azelaic acid (AZELEX) 20 % cream Apply topically 2 (two) times daily. After skin is thoroughly washed and patted dry, gently but thoroughly massage a thin film of azelaic acid cream into the affected area twice daily, in the morning and evening. 12/16/18   Glenis Smoker, MD  benztropine (COGENTIN) 1 MG tablet Take 1 tablet (1 mg total) by mouth daily. 02/03/19   Arfeen, Arlyce Harman, MD  DEPAKOTE ER 250 MG 24 hr tablet Take 2 tablets (500 mg total) by mouth 2 (two) times daily. 02/21/19   Arfeen, Arlyce Harman, MD  ketoconazole (NIZORAL) 2 % cream Apply topically daily. 11/24/18   Matilde Haymaker, MD  levothyroxine (SYNTHROID) 100 MCG tablet Take 1 tablet (100 mcg total) by mouth daily. 12/06/18   Matilde Haymaker, MD  perphenazine (TRILAFON) 8 MG tablet Take  3 tablets (24 mg total) by mouth at bedtime. 02/03/19   Arfeen, Arlyce Harman, MD  phenelzine (NARDIL) 15 MG tablet Take 3 tablets (45 mg total) by mouth 2 (two) times daily. 02/03/19   Arfeen, Arlyce Harman, MD  tacrolimus (PROTOPIC) 0.1 % ointment APPLY TO AFFECTED AREA EVERY DAY AT NIGHT 12/27/18   [provider]    Allergies Lamictal [lamotrigine] and Metformin and related   REVIEW OF SYSTEMS  Negative except as noted here or in the History of Present Illness.   PHYSICAL EXAMINATION  Initial Vital Signs Blood pressure (!) 158/103, pulse 96, temperature 98.8 F (37.1 C), temperature source Oral, resp. rate 18, height 5\' 10"  (1.778 m), weight 117.9 kg, SpO2 96 %.  Examination General: Well-developed, well-nourished male in no acute distress; appearance consistent with age of  record HENT: normocephalic; cauliflower deformity of ears; mild tenderness of left parietal scalp Eyes: pupils equal, round and reactive to light; extraocular muscles intact Neck: supple; nontender Heart: Irregular rhythm Lungs: clear to auscultation bilaterally Abdomen: soft; nondistended; nontender; bowel sounds present Extremities: No deformity; full range of motion; pulses normal; trace edema of lower legs Neurologic: Awake, alert and oriented; motor function intact in all extremities and symmetric; no facial droop; resting tremor of upper extremities Skin: Warm and dry; facial rash consistent with rosacea Psychiatric: Normal mood and affect   RESULTS  Summary of this visit's results, reviewed and interpreted by myself:   EKG Interpretation  Date/Time:    Ventricular Rate:    PR Interval:    QRS Duration:   QT Interval:    QTC Calculation:   R Axis:     Text Interpretation:        Laboratory Studies: No results found for this or any previous visit (from the past 24 hour(s)). Imaging Studies: Ct Head Wo Contrast  Result Date: 04/28/2019 CLINICAL DATA:  Fall from bed with head injury. Rolled out of bed 2 nights ago striking left side head on night stand. Headache today. EXAM: CT HEAD WITHOUT CONTRAST TECHNIQUE: Contiguous axial images were obtained from the base of the skull through the vertex without intravenous contrast. COMPARISON:  07/16/2018 FINDINGS: Brain: Stable but age advanced atrophy. Unchanged chronic small vessel ischemia. No intracranial hemorrhage, mass effect, or midline shift. No hydrocephalus. The basilar cisterns are patent. No evidence of territorial infarct or acute ischemia. No extra-axial or intracranial fluid collection. Vascular: No hyperdense vessel. Skull: No fracture or focal lesion. Sinuses/Orbits: Paranasal sinuses and mastoid air cells are clear. The visualized orbits are unremarkable. Bilateral cataract resection. Other: None. IMPRESSION: 1. No  acute intracranial abnormality. No skull fracture. 2. Stable atrophy and chronic small vessel ischemia. Electronically Signed   By: Keith Rake M.D.   On: 04/28/2019 02:58    ED COURSE and MDM  Nursing notes, initial and subsequent vitals signs, including pulse oximetry, reviewed and interpreted by myself.  Vitals:   04/28/19 0202  BP: (!) 158/103  Pulse: 96  Resp: 18  Temp: 98.8 F (37.1 C)  TempSrc: Oral  SpO2: 96%  Weight: 117.9 kg  Height: 5\' 10"  (1.778 m)   Medications - No data to display  No evidence of significant intracranial injury. Patient may have a mild concussion causing his dizziness.  PROCEDURES  Procedures   ED DIAGNOSES     ICD-10-CM   1. Fall from bed, initial encounter  W06.XXXA   2. Minor head injury without loss of consciousness, initial encounter  S09.90XA  Zailey Audia, Jenny Reichmann, MD 04/28/19 [phone]

## 2019-05-05 ENCOUNTER — Other Ambulatory Visit: Payer: Self-pay

## 2019-05-05 ENCOUNTER — Ambulatory Visit (HOSPITAL_COMMUNITY): Payer: Medicare Other | Admitting: Psychiatry

## 2019-05-14 ENCOUNTER — Other Ambulatory Visit (HOSPITAL_COMMUNITY): Payer: Self-pay | Admitting: Psychiatry

## 2019-05-14 DIAGNOSIS — F330 Major depressive disorder, recurrent, mild: Secondary | ICD-10-CM

## 2019-05-19 ENCOUNTER — Other Ambulatory Visit: Payer: Self-pay

## 2019-05-19 ENCOUNTER — Ambulatory Visit (INDEPENDENT_AMBULATORY_CARE_PROVIDER_SITE_OTHER): Payer: Medicare Other | Admitting: Family Medicine

## 2019-05-19 VITALS — BP 134/82 | HR 78 | Wt 258.0 lb

## 2019-05-19 DIAGNOSIS — L03011 Cellulitis of right finger: Secondary | ICD-10-CM | POA: Diagnosis not present

## 2019-05-19 DIAGNOSIS — E119 Type 2 diabetes mellitus without complications: Secondary | ICD-10-CM

## 2019-05-19 DIAGNOSIS — L03012 Cellulitis of left finger: Secondary | ICD-10-CM | POA: Diagnosis not present

## 2019-05-19 DIAGNOSIS — W19XXXA Unspecified fall, initial encounter: Secondary | ICD-10-CM

## 2019-05-19 DIAGNOSIS — L989 Disorder of the skin and subcutaneous tissue, unspecified: Secondary | ICD-10-CM

## 2019-05-19 DIAGNOSIS — Y92009 Unspecified place in unspecified non-institutional (private) residence as the place of occurrence of the external cause: Secondary | ICD-10-CM

## 2019-05-19 LAB — POCT GLYCOSYLATED HEMOGLOBIN (HGB A1C): HbA1c, POC (controlled diabetic range): 6.3 % (ref 0.0–7.0)

## 2019-05-19 MED ORDER — MUPIROCIN 2 % EX OINT: 1.0000 "application " | TOPICAL_OINTMENT | Freq: Two times a day (BID) | CUTANEOUS | 0 refills | Status: DC

## 2019-05-19 NOTE — Assessment & Plan Note (Signed)
Well-controlled with diet.  A1c 6.3 today.  Encouraged routine follow-up with his primary care provider, Dr. Pilar Plate.  Due for foot and ophthalmology exam.

## 2019-05-19 NOTE — Assessment & Plan Note (Signed)
Several falls in the past year, likely multifactorial but largely mechanical in nature.  Reassuringly no pre-/syncopal episodes prior to falls to suggest alternative etiology.  Reasonable gait instability in the office today, however offered referral to physical therapy for further strengthening and gait training, but he declined at this time. He is on quite a few CNS activating medications which may be contributing.  Discussed general precautions including being mindful of surroundings, appropriate footwear, and taking time between sitting/standing.  Additionally, will have to continue reevaluating Eliquis over the course of time if he continues to have persistent falls.  Instructed to follow-up if he has another fall, he may need further in-depth evaluation with Dr. Wendy Poet in the future.

## 2019-05-19 NOTE — Progress Notes (Signed)
Subjective:    Patient ID: [name], male    DOB: 09/07/1960, 58 y.o.   MRN: [id number]  CC: Falls  HPI: Mr. Hilbert is a 58 year old gentleman with persistent A. fib, schizoaffective disorder, well-controlled type 2 diabetes, hypothyroidism, rosacea, seizures, and elevated BMI discussed the following:  Falls: States his January he is fallen about 5 times.  His initial fall was in January when he tripped walking up his steps and had to have surgical repair of a broken wrist on the left.  After that time, states he fell 1-2 times while taking oxycodone.  Approximately 1-2 months ago he was vacuuming his house and tripped over the cord and fell.  Most recently on 10/23, states he must been having a bad dream because he rolled out of his bed and hit his head.  He was evaluated at urgent care at that time who performed a CT head which was unremarkable.  Denies any precipitating lightheadedness/dizziness, seizures, tunnel vision, chest pain, shortness of breath, palpitations prior to these falls.  Denies any LOC with falls.  Has occasional back pain but otherwise feels well.  Does not feel that his balance is off and walks without a problem, says him and his dad have been called "clumsy" by their family.  He still works out occasionally at [employer].  Lives on an upper floor in his apartment complex, apartment is all carpeted without any movable rugs.  Wears well fitted shoes.  Nail lesion: Occasionally picks at the proximal portion of his nail bed, noticed skin peeling and dried blood collection on his left index finger for the past several weeks.  The area is puffed up a little bit and he is seeing some inflammation surrounding it.  Denies any fever or chills, or lesions elsewhere.  He has not tried anything for it.   Type 2 diabetes: Would like his A1c checked.  Has not had his A1c checked since 12/2018, 6.6 at that time.  Diet controlled.   Smoking status reviewed  Review of Systems Per  HPI    Objective:  BP 134/82   Pulse 78   Wt 258 lb (117 kg)   SpO2 96%   BMI 37.02 kg/m  Vitals and nursing note reviewed  General: NAD, pleasant Cardiac: RRR, normal heart sounds, no murmurs Respiratory: CTAB, normal effort Extremities: Normal stable gait walking in the office.  Left index finger with minor swelling and inflammation at proximal/lateral nail fold with dried blood.  Mildly tender to palpation. Skin: warm and dry, no rashes noted MSK: Full ROM and 5/5 muscle strength in the bilateral lower extremity.  No pain in hip joint with logroll or FABER/FADIR bilaterally.  No tenderness to palpation of lumbar spinous processes or paraspinal musculature. Neuro: alert and oriented, no focal deficits, alert and oriented x3, able to follow commands and follow normal conversation. Psych: normal affect  Assessment & Plan:   Fall at home Several falls in the past year, likely multifactorial but largely mechanical in nature.  Reassuringly no pre-/syncopal episodes prior to falls to suggest alternative etiology.  Reasonable gait instability in the office today, however offered referral to physical therapy for further strengthening and gait training, but he declined at this time. He is on quite a few CNS activating medications which may be contributing.  Discussed general precautions including being mindful of surroundings, appropriate footwear, and taking time between sitting/standing.  Additionally, will have to continue reevaluating Eliquis over the course of time if he continues  to have persistent falls.  Instructed to follow-up if he has another fall, he may need further in-depth evaluation with Dr. Wendy Poet in the future.  T2DM (type 2 diabetes mellitus) ([place]) Well-controlled with diet.  A1c 6.3 today.  Encouraged routine follow-up with his primary care provider, Dr. Pilar Plate.  Due for foot and ophthalmology exam.  Paronychia of left index finger Mild erythema and swelling at  proximal/lateral nail fold consistent with paronychia.  Low concern for felon given history and presentation.  Reassuringly nail plate itself appears unaffected, thus less concern for nailbed pathology.  Recommended warm soaks at least twice daily for 10-15 minutes followed by mupirocin ointment.  May cover with a Band-Aid.  Instructed to follow-up if not improving the next several weeks or sooner if worsening including spreading erythema, fever, increased swelling, pain.   Case discussed with Dr. Juleen China.  Follow-up in the next month/sooner if needed with his PCP for chronic conditions.  [facility] Resident PGY-2

## 2019-05-19 NOTE — Assessment & Plan Note (Addendum)
Mild erythema and swelling at proximal/lateral nail fold consistent with paronychia.  Low concern for felon given history and presentation.  Reassuringly nail plate itself appears unaffected, thus less concern for nailbed pathology.  Recommended warm soaks at least twice daily for 10-15 minutes followed by mupirocin ointment.  May cover with a Band-Aid.  Instructed to follow-up if not improving over the next several weeks or sooner if worsening including spreading erythema, fever, increased swelling, pain.

## 2019-05-19 NOTE — Patient Instructions (Signed)
It was wonderful to see you today.  Be careful with quick movements and make sure that you do not have anything on your floors that can be slippery.  Please let me know if you change your mind about physical therapy and we can always put that referral in to help overall strengthening.  Additionally, if you have any further falls we absolutely need to know about this and consider further evaluation.

## 2019-05-29 ENCOUNTER — Telehealth (HOSPITAL_COMMUNITY): Payer: Self-pay | Admitting: *Deleted

## 2019-05-29 NOTE — Telephone Encounter (Signed)
Pt requesting refills on Cogentin and Depakote. Pt has an appointment on 06/13/19.

## 2019-06-13 ENCOUNTER — Ambulatory Visit (INDEPENDENT_AMBULATORY_CARE_PROVIDER_SITE_OTHER): Payer: Medicare Other | Admitting: Psychiatry

## 2019-06-13 ENCOUNTER — Encounter (HOSPITAL_COMMUNITY): Payer: Self-pay | Admitting: Psychiatry

## 2019-06-13 ENCOUNTER — Other Ambulatory Visit: Payer: Self-pay

## 2019-06-13 DIAGNOSIS — F251 Schizoaffective disorder, depressive type: Secondary | ICD-10-CM

## 2019-06-13 DIAGNOSIS — F330 Major depressive disorder, recurrent, mild: Secondary | ICD-10-CM

## 2019-06-13 MED ORDER — BENZTROPINE MESYLATE 1 MG PO TABS: 1.0000 mg | ORAL_TABLET | Freq: Every day | ORAL | 0 refills | Status: DC

## 2019-06-13 MED ORDER — DEPAKOTE ER 250 MG PO TB24: 500.0000 mg | ORAL_TABLET | Freq: Two times a day (BID) | ORAL | 0 refills | Status: DC

## 2019-06-13 MED ORDER — PERPHENAZINE 8 MG PO TABS: 24.0000 mg | ORAL_TABLET | Freq: Every day | ORAL | 0 refills | Status: DC

## 2019-06-13 MED ORDER — PHENELZINE SULFATE 15 MG PO TABS: 45.0000 mg | ORAL_TABLET | Freq: Two times a day (BID) | ORAL | 0 refills | Status: DC

## 2019-06-13 NOTE — Progress Notes (Signed)
Virtual Visit via Telephone Note  I connected with [name] on 06/13/19 at 10:20 AM EST by telephone and verified that I am speaking with the correct person using two identifiers.   I discussed the limitations, risks, security and privacy concerns of performing an evaluation and management service by telephone and the availability of in person appointments. I also discussed with the patient that there may be a patient responsible charge related to this service. The patient expressed understanding and agreed to proceed.   History of Present Illness: Patient was evaluated by phone session.  He had a fall 8 October when he tripped himself but he is doing much better now.  Overall he described his mood is stable.  He denies any hallucination, paranoia or any suicidal thoughts.  He had a good Thanksgiving.  His brother came and visited his mother.  He is also happy his hemoglobin A1c further dropped from 6.6-6.3.  He is sleeping good.  Denies any irritability or any crying spells.  He denies any concern from the medication he like to keep his current medication dosage.  He is not interested in therapy.  He reported his appetite and weight is unchanged from the past.  Past Psychiatric History:Reviewed. H/Omultiple hospitalization from 1979-1985 due to severe depression, suicidal thoughts. Noh/osuicidal attempt. Diagnosed with OCD, schizoaffective disorder and major depression. Tried numerous medication including TCAs. We tried Lamictal but he develop a rash.  Recent Results (from the past 2160 hour(s))  HgB A1c     Status: None   Collection Time: 05/19/19  9:07 AM  Result Value Ref Range   Hemoglobin A1C     HbA1c POC (<> result, manual entry)     HbA1c, POC (prediabetic range)     HbA1c, POC (controlled diabetic range) 6.3 0.0 - 7.0 %      Psychiatric Specialty Exam: Physical Exam  ROS  There were no vitals taken for this visit.There is no height or weight on file to calculate  BMI.  General Appearance: NA  Eye Contact:  NA  Speech:  Clear and Coherent  Volume:  Normal  Mood:  Euthymic  Affect:  NA  Thought Process:  Goal Directed  Orientation:  Full (Time, Place, and Person)  Thought Content:  WDL  Suicidal Thoughts:  No  Homicidal Thoughts:  No  Memory:  Immediate;   Fair Recent;   Fair Remote;   Good  Judgement:  Intact  Insight:  Present  Psychomotor Activity:  NA  Concentration:  Concentration: Fair and Attention Span: Fair  Recall:  [employer] of Knowledge:  Good  Language:  Good  Akathisia:  No  Handed:  Right  AIMS (if indicated):     Assets:  Communication Skills Desire for Improvement Housing Resilience Social Support  ADL's:  Intact  Cognition:  WNL  Sleep:   ok      Assessment and Plan: Major depressive disorder, recurrent.  Schizoaffective disorder, depressed type.  I reviewed his blood work results.  His hemoglobin A1c is further improved from the past.  Patient is on MAOI inhibitors for a long time and he knows very well his dietary restriction.  He like to keep his current medication.  His last Depakote level was 62 which was done in March.  I will continue Trilafon 24 mg at bedtime, Nardil 45 mg twice a day and Cogentin 1 mg daily.  Recommended to call us back if is any question or any concern.  Follow-up in 3 months.  Follow Up Instructions:    I discussed the assessment and treatment plan with the patient. The patient was provided an opportunity to ask questions and all were answered. The patient agreed with the plan and demonstrated an understanding of the instructions.   The patient was advised to call back or seek an in-person evaluation if the symptoms worsen or if the condition fails to improve as anticipated.  I provided 20 minutes of non-face-to-face time during this encounter.   Kathlee Nations, MD

## 2019-06-16 ENCOUNTER — Other Ambulatory Visit: Payer: Self-pay | Admitting: *Deleted

## 2019-06-16 MED ORDER — APIXABAN 5 MG PO TABS: 5.0000 mg | ORAL_TABLET | Freq: Two times a day (BID) | ORAL | 11 refills | Status: DC

## 2019-06-23 ENCOUNTER — Encounter: Payer: Self-pay | Admitting: Family Medicine

## 2019-06-23 ENCOUNTER — Ambulatory Visit (INDEPENDENT_AMBULATORY_CARE_PROVIDER_SITE_OTHER): Payer: Medicare Other | Admitting: Family Medicine

## 2019-06-23 ENCOUNTER — Other Ambulatory Visit: Payer: Self-pay

## 2019-06-23 VITALS — BP 128/80 | HR 78 | Wt 259.4 lb

## 2019-06-23 DIAGNOSIS — E119 Type 2 diabetes mellitus without complications: Secondary | ICD-10-CM

## 2019-06-23 DIAGNOSIS — L719 Rosacea, unspecified: Secondary | ICD-10-CM

## 2019-06-23 DIAGNOSIS — W19XXXD Unspecified fall, subsequent encounter: Secondary | ICD-10-CM

## 2019-06-23 DIAGNOSIS — Y92009 Unspecified place in unspecified non-institutional (private) residence as the place of occurrence of the external cause: Secondary | ICD-10-CM

## 2019-06-23 NOTE — Assessment & Plan Note (Signed)
No falls in the past month.  Continues to have mild hip pain from his fall over 1 month ago.  Largely consistent with recovering musculoskeletal injury.  Low suspicion for hip fracture, bursitis, neurogenic etiology.  He notes mild tenderness with percussion of the lumbar spine.  Mild weakness of hip flexors noted on exam today.  Will assess lumbar spine and hips with x-ray today. -Follow-up lumbar spine and hip x-rays -Ambulatory referral to physical therapy

## 2019-06-23 NOTE — Patient Instructions (Addendum)
Here is a quick summary of the things we talked about today:  Hip pain: We will get an x-ray just to make sure that you do not have a fracture.  I think it is unlikely you have a fracture.  I have also put in a referral to physical therapy.  Please give our office a call if you have not received a phone call in the next 2 weeks to set up an appointment.  I recommend taking Tylenol for your pain.  Better to avoid ibuprofen because this can act as a blood thinner and increase your risk of bleeding because you are already on blood thinners.  Diabetes: Your feet look good today.   Wrist pain: Please follow-up with your orthopedist  Rosacea/seborrhea: Please follow-up with your dermatologist

## 2019-06-23 NOTE — Assessment & Plan Note (Signed)
Is rosacea is persistent despite treatment prescribed by his dermatologist and improvement of his seborrhea.  He was encouraged to follow-up with his dermatologist for appropriate treatment.

## 2019-06-23 NOTE — Progress Notes (Signed)
Subjective:  [name] is a 58 y.o. male who presents to the [facility] today with a chief complaint of hip pain.   HPI:  Falls at home [name] has been seen multiple times in clinic for recent falls at home.  He was last seen about 1 month ago after a fall at home.  At that time, he had tangled his foot in the cord from his vacuum cleaner and he fell with his right hip hitting the vacuum cleaner.  No imaging was performed at that time and he is generally shown good improvement but he continues to note pain at night while lying in bed when he turns on his right hip.  He reports this is usually a sharp pain which stays local to the outside of his right hip.  Does not notice any radiation or sharp shooting pains.  This does not seem to be exacerbated by walking or any other specific activities.  He is able to walk up stairs although he is hesitant because of his recent falls.  He currently lives in a second story walk up in plans to look for a ground level apartment once issues related to Covid have resolved.  He would like to know if would be appropriate to do x-rays today or what more could be done for his hip pain.  Diabetes Last A1c 6.3 about 1 month ago.  Continues to be well controlled with diet and exercise.  Wrist mobility He notes impaired turning of the left wrist.  He is previously had surgery on this wrist due to injury from a fall.  He denies weakness, numbness, tingling, cold hands.  Rosacea/seborrhea He has previously been seen in our clinic for the treatment of his rosacea and seborrhea.  He is currently being treated by dermatologist for these issues.  He reports that his skin flaking and dryness have significantly improved but he continues to note some redness around his nose, upper lip especially the right side of his face where he is noted numerous papules.  These are nonitchy.  He has been following the treatment prescribed by his dermatologist.  Chief Complaint  noted Review of Symptoms - see HPI PMH - Smoking status noted.    Objective:  Physical Exam: BP 128/80   Pulse 78   Wt 259 lb 6.4 oz (117.7 kg)   SpO2 95%   BMI 37.22 kg/m    HEENT: Please see attached pictures MSK: Slow gait with short stride length noted around the office.  Significant time required to step up to the exam table.  Low confidence in his lower extremity strength.  Mildly decreased strength with hip flexion (4/5).  5/5 strength with knee extension and flexion. Wrist: 5/5 strength for finger flexion.  Neurovascularly intact.  Warm well perfused.  Good cap refill. Skin: warm, dry Neuro: Patellar and Achilles tendon reflexes intact. Psych: Normal affect and thought content  Diabetic Foot Exam - Simple   Simple Foot Form Visual Inspection No deformities, no ulcerations, no other skin breakdown bilaterally: Yes Sensation Testing Intact to touch and monofilament testing bilaterally: Yes Pulse Check See comments: Yes Comments Warm feet but difficult to palpate pulses.      Media Information   Document Information  Photos    06/23/2019 11:20  Attached To:  Office Visit on 06/23/19 with Matilde Haymaker, MD  Source Information  Matilde Haymaker, MD  [facility] Resident     No results found for this or any previous visit (  from the past 72 hour(s)).   Assessment/Plan:  T2DM (type 2 diabetes mellitus) ([facility]) Last A1c 6.3. -Continue current regimen of diet and exercise -Normal foot exam today -Follow-up in 3 months  Rosacea Is rosacea is persistent despite treatment prescribed by his dermatologist and improvement of his seborrhea.  He was encouraged to follow-up with his dermatologist for appropriate treatment.  Fall at home No falls in the past month.  Continues to have mild hip pain from his fall over 1 month ago.  Largely consistent with recovering musculoskeletal injury.  Low suspicion for hip fracture, bursitis, neurogenic etiology.  He notes mild  tenderness with percussion of the lumbar spine.  Mild weakness of hip flexors noted on exam today.  Will assess lumbar spine and hips with x-ray today. -Follow-up lumbar spine and hip x-rays -Ambulatory referral to physical therapy   Poor wrist mobility Likely secondary to his recent surgery.  No evidence of neurovascular compromise. -Follow-up with orthopedist

## 2019-06-23 NOTE — Assessment & Plan Note (Signed)
Last A1c 6.3. -Continue current regimen of diet and exercise -Normal foot exam today -Follow-up in 3 months

## 2019-09-01 ENCOUNTER — Other Ambulatory Visit (HOSPITAL_COMMUNITY): Payer: Self-pay | Admitting: Psychiatry

## 2019-09-01 DIAGNOSIS — F251 Schizoaffective disorder, depressive type: Secondary | ICD-10-CM

## 2019-09-01 DIAGNOSIS — F330 Major depressive disorder, recurrent, mild: Secondary | ICD-10-CM

## 2019-09-02 ENCOUNTER — Encounter (HOSPITAL_BASED_OUTPATIENT_CLINIC_OR_DEPARTMENT_OTHER): Payer: Self-pay | Admitting: Emergency Medicine

## 2019-09-02 ENCOUNTER — Other Ambulatory Visit: Payer: Self-pay

## 2019-09-02 ENCOUNTER — Emergency Department (HOSPITAL_BASED_OUTPATIENT_CLINIC_OR_DEPARTMENT_OTHER): Payer: Medicare Other

## 2019-09-02 ENCOUNTER — Emergency Department (HOSPITAL_BASED_OUTPATIENT_CLINIC_OR_DEPARTMENT_OTHER)
Admission: EM | Admit: 2019-09-02 | Discharge: 2019-09-02 | Disposition: A | Discharge status: Home / Self Care | Payer: Medicare Other | Attending: Emergency Medicine | Admitting: Emergency Medicine

## 2019-09-02 DIAGNOSIS — Z7901 Long term (current) use of anticoagulants: Secondary | ICD-10-CM | POA: Insufficient documentation

## 2019-09-02 DIAGNOSIS — R102 Pelvic and perineal pain: Secondary | ICD-10-CM | POA: Diagnosis not present

## 2019-09-02 DIAGNOSIS — E039 Hypothyroidism, unspecified: Secondary | ICD-10-CM | POA: Diagnosis not present

## 2019-09-02 DIAGNOSIS — M545 Low back pain: Secondary | ICD-10-CM | POA: Diagnosis not present

## 2019-09-02 DIAGNOSIS — Z79899 Other long term (current) drug therapy: Secondary | ICD-10-CM | POA: Diagnosis not present

## 2019-09-02 DIAGNOSIS — M160 Bilateral primary osteoarthritis of hip: Secondary | ICD-10-CM

## 2019-09-02 DIAGNOSIS — M25551 Pain in right hip: Secondary | ICD-10-CM | POA: Diagnosis present

## 2019-09-02 DIAGNOSIS — M1612 Unilateral primary osteoarthritis, left hip: Secondary | ICD-10-CM | POA: Diagnosis not present

## 2019-09-02 MED ORDER — MELOXICAM 7.5 MG PO TABS: ORAL_TABLET | ORAL | 0 refills | Status: DC

## 2019-09-02 NOTE — ED Triage Notes (Signed)
Pt states he is having pain in his lower back and pelvic pain  Pt states the pain has been going on for about a month but is getting worse  Pt states he feels like he has a knot on his vertebrae  Pt also states his right arm has been popping and he has sore muscles  Pt denies any flu like sxs

## 2019-09-02 NOTE — ED Provider Notes (Signed)
[facility] DEPT [facility] Provider Note: Georgena Spurling, MD, FACEP  CSN: [id number] MRN: [id number] ARRIVAL: 09/02/19 at [place]: [place]  Back Pain   HISTORY OF PRESENT ILLNESS  09/02/19 3:22 AM [name] is a 59 y.o. male who has had 3 falls over the last year, 1 of which resulted in a left wrist fracture for which she had surgery.  He is here complaining of pain in his hips, primarily the right, which he attributes to a fall about 3 weeks ago.  He rates the pain as a 10 out of 10, worse with ambulation.  He states the pain is worsened despite new injury.  He has not been taking anything prescription for it.  He also has had generalized muscle aches for the past several days but denies fever or chills.   Past Medical History:  Diagnosis Date  . A-fib ([place])   . Arrhythmia   . Chronic a-fib ([place])   . Closed fracture of left distal radius   . Depression   . Depression   . Hypothyroidism   . Low back pain   . Morbid obesity, BMI unknown ([name])   . Osteoarthritis    oa in bilateral knees  . Schizophrenia ([place])   . Seizures ([place])   . Thyroid disease   . Varicose veins     Past Surgical History:  Procedure Laterality Date  . ACHILLES TENDON REPAIR  approx. 2004   right foot  . CATARACT EXTRACTION     bilateral  . OPEN REDUCTION INTERNAL FIXATION (ORIF) DISTAL RADIAL FRACTURE Left 07/21/2018   Procedure: OPEN REDUCTION INTERNAL FIXATION (ORIF) DISTAL RADIAL FRACTURE;  Surgeon: [name], MD;  Location: [facility];  Service: Orthopedics;  Laterality: Left;  . stab phlebectomy  Right 11/09/2016   stab phlebectomy R leg by Tinnie Gens MD  . TOOTH EXTRACTION      Family History  Problem Relation Age of Onset  . Diabetes Father   . Heart disease Father   . Diabetes Brother   . OCD Mother     Social History   Tobacco Use  . Smoking status: Never Smoker  . Smokeless tobacco: Never Used  Substance Use Topics  . Alcohol use:  No    Alcohol/week: 0.0 standard drinks  . Drug use: No    Prior to Admission medications   Medication Sig Start Date End Date Taking? Authorizing Provider  allopurinol (ZYLOPRIM) 300 MG tablet Take 1 tablet (300 mg total) by mouth daily. 11/24/18   Matilde Haymaker, MD  apixaban (ELIQUIS) 5 MG TABS tablet Take 1 tablet (5 mg total) by mouth 2 (two) times daily. 06/16/19   Matilde Haymaker, MD  azelaic acid (AZELEX) 20 % cream Apply topically 2 (two) times daily. After skin is thoroughly washed and patted dry, gently but thoroughly massage a thin film of azelaic acid cream into the affected area twice daily, in the morning and evening. 12/16/18   Glenis Smoker, MD  benztropine (COGENTIN) 1 MG tablet Take 1 tablet (1 mg total) by mouth daily. 06/13/19   Arfeen, Arlyce Harman, MD  DEPAKOTE ER 250 MG 24 hr tablet Take 2 tablets (500 mg total) by mouth 2 (two) times daily. 06/13/19   Arfeen, Arlyce Harman, MD  ketoconazole (NIZORAL) 2 % cream Apply topically daily. 11/24/18   Matilde Haymaker, MD  levothyroxine (SYNTHROID) 100 MCG tablet Take 1 tablet (100 mcg total) by mouth daily. 12/06/18   Matilde Haymaker,  MD  meloxicam (MOBIC) 7.5 MG tablet Take 1 tablet daily for hip pain. 09/02/19   Girlie Veltri, MD  mupirocin ointment (BACTROBAN) 2 % Apply 1 application topically 2 (two) times daily. After warm soaking finger for 10-15 minutes. 05/19/19   Patriciaann Clan, DO  perphenazine (TRILAFON) 8 MG tablet Take 3 tablets (24 mg total) by mouth at bedtime. 06/13/19   Arfeen, Arlyce Harman, MD  phenelzine (NARDIL) 15 MG tablet Take 3 tablets (45 mg total) by mouth 2 (two) times daily. 06/13/19   Arfeen, Arlyce Harman, MD  tacrolimus (PROTOPIC) 0.1 % ointment APPLY TO AFFECTED AREA EVERY DAY AT NIGHT 12/27/18   [provider]    Allergies Lamictal [lamotrigine] and Metformin and related   REVIEW OF SYSTEMS  Negative except as noted here or in the History of Present Illness.   PHYSICAL EXAMINATION  Initial Vital Signs Blood  pressure (!) 147/107, pulse 85, temperature 98 F (36.7 C), temperature source Oral, resp. rate 20, height 5' 10.5" (1.791 m), weight 114.8 kg, SpO2 95 %.  Examination General: Well-developed, well-nourished male in no acute distress; appearance consistent with age of record HENT: normocephalic; atraumatic Eyes: pupils equal, round and reactive to light; extraocular muscles intact bilateral pseudophakia Neck: supple Heart: Irregular rhythm Lungs: clear to auscultation bilaterally Abdomen: soft; nondistended; nontender; bowel sounds present Back: Mild midline upper sacral tenderness Extremities: No deformity; pulses normal; trace edema of lower legs; tenderness of right hip Neurologic: Awake, alert and oriented; motor function intact in all extremities and symmetric; no facial droop; tremor of right hand  Skin: Warm and dry Psychiatric: Normal mood and affect   RESULTS  Summary of this visit's results, reviewed and interpreted by myself:   EKG Interpretation  Date/Time:    Ventricular Rate:    PR Interval:    QRS Duration:   QT Interval:    QTC Calculation:   R Axis:     Text Interpretation:        Laboratory Studies: No results found for this or any previous visit (from the past 24 hour(s)). Imaging Studies: DG Hips Bilat W or Wo Pelvis 3-4 Views  Result Date: 09/02/2019 CLINICAL DATA:  Low back and pelvic pain. EXAM: DG HIP (WITH OR WITHOUT PELVIS) 3-4V BILAT COMPARISON:  None. FINDINGS: Frontal pelvis shows no fracture. SI joints and symphysis pubis unremarkable. No worrisome lytic or sclerotic osseous abnormality. AP and frog-leg lateral views of the right hip show mild degenerative changes in the femoral head and acetabulum. No evidence of fracture. AP and frog-leg lateral views of the left hip also show mild degenerative change without acute bony abnormality. IMPRESSION: Mild degenerative changes in the hips, right greater than left, without acute bony abnormality.  Electronically Signed   By: Misty Stanley M.D.   On: 09/02/2019 04:15    ED COURSE and MDM  Nursing notes, initial and subsequent vitals signs, including pulse oximetry, reviewed and interpreted by myself.  Vitals:   09/02/19 0314 09/02/19 0317  BP:  (!) 147/107  Pulse:  85  Resp:  20  Temp:  98 F (36.7 C)  TempSrc:  Oral  SpO2:  95%  Weight: 114.8 kg   Height: 5' 10.5" (1.791 m)    Medications - No data to display  The patient has been referred to an orthopedist by his PCP but has not yet followed up.  We will start him on a low-dose of Mobic in the meantime.  X-rays are consistent with degenerative changes in  the hips, right greater than left which is consistent with his clinical presentation.  PROCEDURES  Procedures   ED DIAGNOSES     ICD-10-CM   1. Primary osteoarthritis of both hips  M16.0        Corine Solorio, MD 09/02/19 [phone]

## 2019-09-03 ENCOUNTER — Other Ambulatory Visit (HOSPITAL_COMMUNITY): Payer: Self-pay | Admitting: Psychiatry

## 2019-09-03 DIAGNOSIS — F330 Major depressive disorder, recurrent, mild: Secondary | ICD-10-CM

## 2019-09-11 ENCOUNTER — Encounter (HOSPITAL_COMMUNITY): Payer: Self-pay | Admitting: Psychiatry

## 2019-09-11 ENCOUNTER — Other Ambulatory Visit: Payer: Self-pay

## 2019-09-11 ENCOUNTER — Ambulatory Visit (INDEPENDENT_AMBULATORY_CARE_PROVIDER_SITE_OTHER): Payer: Medicare Other | Admitting: Psychiatry

## 2019-09-11 ENCOUNTER — Other Ambulatory Visit: Payer: Self-pay | Admitting: *Deleted

## 2019-09-11 DIAGNOSIS — F251 Schizoaffective disorder, depressive type: Secondary | ICD-10-CM

## 2019-09-11 DIAGNOSIS — F330 Major depressive disorder, recurrent, mild: Secondary | ICD-10-CM

## 2019-09-11 MED ORDER — PHENELZINE SULFATE 15 MG PO TABS: 45.0000 mg | ORAL_TABLET | Freq: Two times a day (BID) | ORAL | 0 refills | Status: DC

## 2019-09-11 MED ORDER — LEVOTHYROXINE SODIUM 100 MCG PO TABS: 100.0000 ug | ORAL_TABLET | Freq: Every day | ORAL | 3 refills | Status: DC

## 2019-09-11 MED ORDER — BENZTROPINE MESYLATE 1 MG PO TABS: 1.0000 mg | ORAL_TABLET | Freq: Every day | ORAL | 0 refills | Status: DC

## 2019-09-11 MED ORDER — DEPAKOTE ER 250 MG PO TB24: 500.0000 mg | ORAL_TABLET | Freq: Two times a day (BID) | ORAL | 0 refills | Status: DC

## 2019-09-11 MED ORDER — PERPHENAZINE 8 MG PO TABS: 24.0000 mg | ORAL_TABLET | Freq: Every day | ORAL | 0 refills | Status: DC

## 2019-09-11 NOTE — Progress Notes (Signed)
Virtual Visit via Telephone Note  I connected with [name] on 09/11/19 at 10:20 AM EST by telephone and verified that I am speaking with the correct person using two identifiers.   I discussed the limitations, risks, security and privacy concerns of performing an evaluation and management service by telephone and the availability of in person appointments. I also discussed with the patient that there may be a patient responsible charge related to this service. The patient expressed understanding and agreed to proceed.   History of Present Illness: Patient was evaluated by phone session.  He was recently seen in the emergency room because of back pain.  He was told he had C orthopedic.  He was given meloxicam but he has not started because he could not afford.  Overall he described his mood is stable.  He is sleeping good.  He denies any paranoia, hallucination, irritability or any crying spells.  His mother lives in [place] who usually comes to visit him and sometimes he goes to [place] to visit her.  He does not want to change medication since they are working well.  He has no tremors, shakes or any EPS.  His last hemoglobin A1c was 6.3.  He has no Depakote level in a while.  He has not schedule appointment with his PCP but hoping to make appointment very soon.  I recommend to have his blood work including Depakote level which he like to have drawn when he had a visit with his PCP.  Patient denies drinking or using any illegal substances.  His energy level is okay.   Past Psychiatric History:Reviewed. H/Omultiple inpatient from [phone] for severe depression, suicidal thoughts. Noh/osuicidal attempt. H/O OCD, schizoaffective disorder and major depression. Tried numerous medication including TCAs. We tried Lamictal but he develop a rash.   Psychiatric Specialty Exam: Physical Exam  Review of Systems  There were no vitals taken for this visit.There is no height or weight on file to  calculate BMI.  General Appearance: NA  Eye Contact:  NA  Speech:  Clear and Coherent and Slow  Volume:  Normal  Mood:  Euthymic  Affect:  NA  Thought Process:  Goal Directed  Orientation:  Full (Time, Place, and Person)  Thought Content:  WDL  Suicidal Thoughts:  No  Homicidal Thoughts:  No  Memory:  Immediate;   Fair Recent;   Fair Remote;   Good  Judgement:  Intact  Insight:  Present  Psychomotor Activity:  NA  Concentration:  Concentration: Fair and Attention Span: Fair  Recall:  [employer] of Knowledge:  Good  Language:  Good  Akathisia:  No  Handed:  Right  AIMS (if indicated):     Assets:  Communication Skills Desire for Improvement Housing Social Support  ADL's:  Intact  Cognition:  WNL  Sleep:   ok      Assessment and Plan: Major depressive disorder, recurrent.  Schizoaffective disorder, depressed type.  Patient is a stable on his current medication.  Patient is on MAOI inhibitors for a long time and he knows how to take the medicine with his dietary restriction.  He does not want to change medication since they are okay.  His last Depakote level was 62 which was done in March.  Continue Trilafon 24 mg at bedtime, Nardil 45 mg twice a day and Cogentin 1 mg daily.  I will forward my note to his primary care for the Depakote level since patient going keep scheduled appointment very soon.  Discussed medication side effects and benefits.  Recommended to call us back if he has any question or any concern.  Follow-up in 3 months.  Follow Up Instructions:    I discussed the assessment and treatment plan with the patient. The patient was provided an opportunity to ask questions and all were answered. The patient agreed with the plan and demonstrated an understanding of the instructions.   The patient was advised to call back or seek an in-person evaluation if the symptoms worsen or if the condition fails to improve as anticipated.  I provided 20 minutes of  non-face-to-face time during this encounter.   Kathlee Nations, MD

## 2019-09-26 ENCOUNTER — Telehealth (HOSPITAL_COMMUNITY): Payer: Self-pay | Admitting: *Deleted

## 2019-09-26 NOTE — Telephone Encounter (Signed)
Writer returned pt message left 3/22, regarding out of refills on all psych meds. Writer informed pt that all rx's written on 09/11/19 were for 90 days and encouraged pt to speak with pharmacy, [facility] [address] or call writer back with any other questions or concerns.

## 2019-09-29 ENCOUNTER — Other Ambulatory Visit (HOSPITAL_COMMUNITY): Payer: Self-pay | Admitting: Psychiatry

## 2019-10-02 ENCOUNTER — Telehealth (HOSPITAL_COMMUNITY): Payer: Self-pay | Admitting: *Deleted

## 2019-10-02 NOTE — Telephone Encounter (Signed)
Writer spoke with pt informing him that Dr. Adele Schilder says he's been using generic Depaokte Er, not specifically namebrand. Pt was confused as to whether or not he has been getting the brand name only. Writer clarified that, per [facility], 90 day supply is ready for him. Writer suggested pt call pharmacy to fill as it may have gone back on shelf when he didn't pick up. Pt verbalizes understanding.

## 2019-10-02 NOTE — Telephone Encounter (Signed)
Please call the pharmacy to clarify since we are not giving brand name.  I have given a printout on Friday when he came to office as he was very nervous about running out from Depakote.  As per epic we have not switched to brand name and he should have refill for Depakote.  Please let me know why pharmacy is not giving him Depakote.  Thanks

## 2019-10-02 NOTE — Telephone Encounter (Signed)
Writer returned pt call from this a.m. asking for refill of the Depakote ER 250mg . Per pt pharmacy [facility], pt has a 3 months supply waiting toi be picked up, but there also is a PA for name brand. Pt states that he doesn't remember if he's taking generic or name brand. Doesn't remember why he would be taking brand name only. Nothing marked in Rx to indicate.please review as pt is very close to being out og med completely and wants to hear from doctor, via this nurse.

## 2019-10-05 NOTE — Telephone Encounter (Signed)
We spoke with the patient and the pharmacy regarding his Depakote ER 250MG . Patient agreed to take generic brand; therefore, pharmacy is filling medication generically

## 2019-10-11 ENCOUNTER — Telehealth (HOSPITAL_COMMUNITY): Payer: Self-pay | Admitting: *Deleted

## 2019-10-11 ENCOUNTER — Telehealth (HOSPITAL_COMMUNITY): Payer: Self-pay

## 2019-10-11 NOTE — Telephone Encounter (Signed)
I agree. Needs to take generic until PA approved.

## 2019-10-11 NOTE — Telephone Encounter (Signed)
Received t/c from pt c/o that the generic Depakote ER, or Valproic Acid, is not working for him and that's the reason he has been taking brand name for "over 20 years". Pt stated that he feels lethargic and irritable and states "it's not working" and I don't feel I had a choice in this decision. Writer saw pt on 10/06/19 and discussed this issue at length. Pt was encouraged to try generic, because he stated he was completely out of medication, at least until office submitted PA for brand name, which may or may be approved. Pt agreed to give the generic a try. Writer was very clear and repeated this several times. Pt agreed. PA now submitted, waiting on review.. Pt is aware. Will contact pt with decision.

## 2019-10-11 NOTE — Telephone Encounter (Signed)
Prior authorization sent for Depakote ER. Awaiting response. PA# [id number] Key: [id number]

## 2019-10-11 NOTE — Telephone Encounter (Signed)
Pt came to office on 10/06/19 demanding to talk about his medication. Pt was upset because his pharmacy told him there was a PA sent out for name brand Depakote ER. Writer had spoken with pt earlier in the week about this. Pt was angry and upset and did not seem to remember conversation. Pt stated he was completely out of the Depakote. Pt could not verbalize why he needed brand name and writer could  not find anything documented as such nor could provider.  This nurse explained that generic brands use different fillers mainly and that that can cause some people to have mild side effects. Nurse explained valproic acid is the same medication and less expensive. Writer reinforced this several times. Spoke with [facility]Shirlean Mylar[facility] and she stated there was a Rx waiting for pt but he was refusing it because it is generic. Writer spoke with pt again and encouraged pt to at least take generic until PA could be started. Pt agreed to try the generic version. Pt calm and cooperative and verbalized understanding.

## 2019-10-12 ENCOUNTER — Other Ambulatory Visit (HOSPITAL_COMMUNITY): Payer: Self-pay | Admitting: *Deleted

## 2019-10-12 ENCOUNTER — Telehealth (HOSPITAL_COMMUNITY): Payer: Self-pay | Admitting: *Deleted

## 2019-10-12 DIAGNOSIS — F330 Major depressive disorder, recurrent, mild: Secondary | ICD-10-CM

## 2019-10-12 NOTE — Telephone Encounter (Signed)
Prior authorization approved through [place] [employer]. Approved 07/13/2019-10/10/2020.

## 2019-10-12 NOTE — Telephone Encounter (Signed)
Thanks

## 2019-10-12 NOTE — Telephone Encounter (Signed)
Writer left VM for pt mother Hinton Dyer, informing her that PA for Depakote ER has been approved thru 4/22.

## 2019-11-02 ENCOUNTER — Other Ambulatory Visit: Payer: Self-pay

## 2019-11-02 ENCOUNTER — Telehealth: Payer: Self-pay

## 2019-11-02 ENCOUNTER — Ambulatory Visit (INDEPENDENT_AMBULATORY_CARE_PROVIDER_SITE_OTHER): Payer: Medicare Other | Admitting: Family Medicine

## 2019-11-02 VITALS — BP 110/70 | HR 84 | Ht 70.0 in | Wt 255.2 lb

## 2019-11-02 DIAGNOSIS — E039 Hypothyroidism, unspecified: Secondary | ICD-10-CM

## 2019-11-02 DIAGNOSIS — R42 Dizziness and giddiness: Secondary | ICD-10-CM | POA: Diagnosis not present

## 2019-11-02 DIAGNOSIS — E119 Type 2 diabetes mellitus without complications: Secondary | ICD-10-CM | POA: Diagnosis not present

## 2019-11-02 DIAGNOSIS — I4819 Other persistent atrial fibrillation: Secondary | ICD-10-CM

## 2019-11-02 DIAGNOSIS — F251 Schizoaffective disorder, depressive type: Secondary | ICD-10-CM | POA: Diagnosis not present

## 2019-11-02 LAB — POCT GLYCOSYLATED HEMOGLOBIN (HGB A1C): HbA1c, POC (controlled diabetic range): 6.3 % (ref 0.0–7.0)

## 2019-11-02 NOTE — Telephone Encounter (Signed)
Patient calls nurse line reporting new onset dizziness. Patient reports yesterday evening he was awoken from sleep, feeling dizzy. Patient does report increased dizziness with movement of head. Patient also reports some "mild confusion". Patient states he feels "fuzzy". Patient is alert and oriented x4. Patient states he is type II diabetic, however, does not have glucometer to check blood sugar. Patient reports drinking juice and feeling slightly bette. Most recent BP 135/90   Patient denies having CP, SHOB, arm tingling/numbness, facial drooping, slurred speech, or difficulties walking. Patient is appropriate and is able to speak in complete sentences.   Strict ED precautions given. Patient does not appear to be in distress. Scheduled appointment this afternoon with ATC, after precepting with Dr. Dione Plover.   To PCP and Dr. Shan Levans (ATC provider)

## 2019-11-02 NOTE — Progress Notes (Signed)
    SUBJECTIVE:   CHIEF COMPLAINT / HPI:   Dizziness Mr. Kuethe reports that in the middle of the night last night, he felt a sudden wave of dizziness while he was lying in bed.  He says that there was no trigger.  He felt somewhat nauseated as well.  He got up out of bed and went to sit in the recliner, and the dizziness faded.  He says that it felt like the room was spinning.  He denied any weakness, vision changes, did report a slight headache.  He has not felt dizzy since.  He does say that he sometimes feels lightheaded whenever he rises from a seated position.  He has been taking all of his medications as prescribed, including his blood thinner.  He does not currently take aspirin.  He feels well currently.  PERTINENT  PMH / PSH: Paroxysmal atrial fibrillation, type 2 diabetes, hypothyroidism  OBJECTIVE:   BP 110/70   Pulse 84   Ht 5\' 10"  (1.778 m)   Wt 255 lb 4 oz (115.8 kg)   SpO2 96%   BMI 36.62 kg/m   General: well appearing, appears stated age Cardiac: Regular rhythm, normal rate, no MRG Respiratory: CTAB, no rhonchi, rales, or wheezing, normal work of breathing Skin: no rashes or other lesions, warm and well perfused Neuro: CN II through XII intact, 5/5 equally on bilateral upper and lower extremities, sensation equal bilaterally, normal gait, normal speech Psych: appropriate mood and affect  Orthostatic VS for the past 24 hrs:  BP- Lying Pulse- Lying BP- Sitting Pulse- Sitting BP- Standing at 0 minutes Pulse- Standing at 0 minutes  11/02/19 1609 126/80 95 130/90 83 100/70 81     ASSESSMENT/PLAN:   Dizziness Possibly BPPV or vertigo, although TIA is also a possibility.  Normal neuro exam and resolution of symptoms are reassuring signs that this is not a CVA.  Patient's orthostatic vital signs could indicate some element of orthostatic hypotension, but this would not fit with onset of dizziness while he is supine.  Since he is on a blood thinner, we will check a CBC  today.  We will also check a BMP and TSH.  Counseled him on the importance of getting a colonoscopy, and he will consider this.  He was counseled on the signs and symptoms of a CVA and told to call our clinic or go to the ED if he has another spell of dizziness that does not resolve weekly.  T2DM (type 2 diabetes mellitus) ([place]) Well-controlled today with a hemoglobin A1c of 6.3.  This is likely not contributing to his episode of dizziness.  We will also check a lipid panel today.  Persistent atrial fibrillation (HCC) Regular rhythm today, this could be a contributing factor to his episode of dizziness, but difficult to say for certain, especially since the patient cannot tell when he is in A. fib usually.  Schizoaffective disorder, depressive type ([place]) We will check a valproic acid level today.       Kathrene Alu, MD [place]

## 2019-11-02 NOTE — Patient Instructions (Addendum)
It was nice meeting you today [name]!  Today, we are checking your blood count, electrolytes and kidney function, thyroid function, medication level, and cholesterol.  We also checked your blood pressure in different positions.  I think that you may have had a spell of vertigo, which does not need to be treated currently.  However, please let us know if it occurs again.  Please also call 911 if you ever experience sudden weakness, change in speech, or change in vision.  Your A1c was 6.3 today, so your diabetes is likely not causing your symptoms.  If you have any questions or concerns, please feel free to call the clinic.   Be well,  Dr. Shan Levans

## 2019-11-03 ENCOUNTER — Other Ambulatory Visit: Payer: Self-pay | Admitting: Family Medicine

## 2019-11-03 DIAGNOSIS — R42 Dizziness and giddiness: Secondary | ICD-10-CM

## 2019-11-03 HISTORY — DX: Dizziness and giddiness: R42

## 2019-11-03 LAB — BASIC METABOLIC PANEL
BUN/Creatinine Ratio: 14 (ref 10–24)
BUN: 13 mg/dL (ref 8–27)
CO2: 27 mmol/L (ref 20–29)
Calcium: 9.4 mg/dL (ref 8.6–10.2)
Chloride: 97 mmol/L (ref 96–106)
Creatinine, Ser: 0.92 mg/dL (ref 0.76–1.27)
GFR calc Af Amer: 103 mL/min/{1.73_m2} (ref >59)
GFR calc non Af Amer: 89 mL/min/{1.73_m2} (ref >59)
Glucose: 112 mg/dL — ABNORMAL HIGH (ref 65–99)
Potassium: 4.4 mmol/L (ref 3.5–5.2)
Sodium: 139 mmol/L (ref 134–144)

## 2019-11-03 LAB — LIPID PANEL
Chol/HDL Ratio: 6.7 ratio — ABNORMAL HIGH (ref 0.0–5.0)
Cholesterol, Total: 207 mg/dL — ABNORMAL HIGH (ref 100–199)
HDL: 31 mg/dL — ABNORMAL LOW (ref >39)
LDL Chol Calc (NIH): 138 mg/dL — ABNORMAL HIGH (ref 0–99)
Triglycerides: 212 mg/dL — ABNORMAL HIGH (ref 0–149)
VLDL Cholesterol Cal: 38 mg/dL (ref 5–40)

## 2019-11-03 LAB — CBC
Hematocrit: 46.5 % (ref 37.5–51.0)
Hemoglobin: 15.6 g/dL (ref 13.0–17.7)
MCH: 30.2 pg (ref 26.6–33.0)
MCHC: 33.5 g/dL (ref 31.5–35.7)
MCV: 90 fL (ref 79–97)
Platelets: 187 10*3/uL (ref 150–450)
RBC: 5.17 x10E6/uL (ref 4.14–5.80)
RDW: 14 % (ref 11.6–15.4)
WBC: 8.2 10*3/uL (ref 3.4–10.8)

## 2019-11-03 LAB — TSH: TSH: 1.96 u[IU]/mL (ref 0.450–4.500)

## 2019-11-03 LAB — VALPROIC ACID LEVEL: Valproic Acid Lvl: 78 ug/mL (ref 50–100)

## 2019-11-03 MED ORDER — ATORVASTATIN CALCIUM 40 MG PO TABS: 40.0000 mg | ORAL_TABLET | Freq: Every day | ORAL | 11 refills | Status: DC

## 2019-11-03 NOTE — Assessment & Plan Note (Signed)
Regular rhythm today, this could be a contributing factor to his episode of dizziness, but difficult to say for certain, especially since the patient cannot tell when he is in A. fib usually.

## 2019-11-03 NOTE — Assessment & Plan Note (Signed)
We will check a valproic acid level today.

## 2019-11-03 NOTE — Assessment & Plan Note (Addendum)
Well-controlled today with a hemoglobin A1c of 6.3.  This is likely not contributing to his episode of dizziness.  We will also check a lipid panel today.

## 2019-11-03 NOTE — Assessment & Plan Note (Addendum)
Possibly BPPV or vertigo, although TIA is also a possibility.  Normal neuro exam and resolution of symptoms are reassuring signs that this is not a CVA.  Patient's orthostatic vital signs could indicate some element of orthostatic hypotension, but this would not fit with onset of dizziness while he is supine.  Since he is on a blood thinner, we will check a CBC today.  We will also check a BMP and TSH.  Counseled him on the importance of getting a colonoscopy, and he will consider this.  He was counseled on the signs and symptoms of a CVA and told to call our clinic or go to the ED if he has another spell of dizziness that does not resolve weekly.

## 2019-11-03 NOTE — Progress Notes (Signed)
Call patient to inform him of his normal lab results except for his elevated cholesterol.  We discussed that elevated cholesterol can increase his risk of heart attack and stroke, so I recommended starting a statin.  Patient was amenable to this.  We will start atorvastatin 40 mg once daily since this seems to be covered by his insurance.  I asked him to call if he experiences any difficulty with this medication.

## 2019-11-06 DIAGNOSIS — L218 Other seborrheic dermatitis: Secondary | ICD-10-CM | POA: Diagnosis not present

## 2019-11-06 DIAGNOSIS — L718 Other rosacea: Secondary | ICD-10-CM | POA: Diagnosis not present

## 2019-11-17 ENCOUNTER — Telehealth (HOSPITAL_COMMUNITY): Payer: Self-pay | Admitting: *Deleted

## 2019-11-17 NOTE — Telephone Encounter (Signed)
I tried to call him but got voicemail and left a message.  He may consider therapy to help his intrusive thoughts.  His Depakote level is therapeutic.

## 2019-11-17 NOTE — Telephone Encounter (Signed)
Pt called c/o intrusive, obsessive, compulsive thoughts and would like advice from you. Please review and advise. Next appointment 12/12/19.

## 2019-11-20 ENCOUNTER — Other Ambulatory Visit (HOSPITAL_COMMUNITY): Payer: Self-pay | Admitting: Psychiatry

## 2019-11-20 DIAGNOSIS — F330 Major depressive disorder, recurrent, mild: Secondary | ICD-10-CM

## 2019-12-06 ENCOUNTER — Other Ambulatory Visit (HOSPITAL_COMMUNITY): Payer: Self-pay | Admitting: Psychiatry

## 2019-12-06 DIAGNOSIS — F251 Schizoaffective disorder, depressive type: Secondary | ICD-10-CM

## 2019-12-06 DIAGNOSIS — F330 Major depressive disorder, recurrent, mild: Secondary | ICD-10-CM

## 2019-12-07 ENCOUNTER — Other Ambulatory Visit (HOSPITAL_COMMUNITY): Payer: Self-pay | Admitting: Psychiatry

## 2019-12-07 ENCOUNTER — Telehealth (HOSPITAL_COMMUNITY): Payer: Self-pay | Admitting: *Deleted

## 2019-12-07 DIAGNOSIS — F330 Major depressive disorder, recurrent, mild: Secondary | ICD-10-CM

## 2019-12-07 NOTE — Telephone Encounter (Signed)
Thank you :)

## 2019-12-07 NOTE — Telephone Encounter (Signed)
Pt has called several times this morning c/o that he needs a refill on the Trilafon and had to get "loaner pills" from the pharmacy. This nurse has not received any refill requests from pharmacy. Rx last ordered on 09/11/19 for #240 so he would be short 30 pills. Do ou want me to refill for 270 (90 days) or for enough to get him through to next appointment? Pt has an appointment upcoming on 6/8.

## 2019-12-07 NOTE — Telephone Encounter (Signed)
I called Trilafon for 90 days supply.

## 2019-12-08 ENCOUNTER — Ambulatory Visit (INDEPENDENT_AMBULATORY_CARE_PROVIDER_SITE_OTHER): Payer: Medicare Other | Admitting: Family Medicine

## 2019-12-08 ENCOUNTER — Other Ambulatory Visit: Payer: Self-pay

## 2019-12-08 ENCOUNTER — Encounter: Payer: Self-pay | Admitting: Family Medicine

## 2019-12-08 VITALS — BP 112/62 | HR 84 | Wt 260.4 lb

## 2019-12-08 DIAGNOSIS — M5416 Radiculopathy, lumbar region: Secondary | ICD-10-CM | POA: Insufficient documentation

## 2019-12-08 DIAGNOSIS — E785 Hyperlipidemia, unspecified: Secondary | ICD-10-CM

## 2019-12-08 DIAGNOSIS — E1169 Type 2 diabetes mellitus with other specified complication: Secondary | ICD-10-CM

## 2019-12-08 NOTE — Assessment & Plan Note (Signed)
No neurologic deficit on exam. Given intermittent weakness of his LL, I recommended MRI instead of an xray. MRI ordered and was scheduled during this visit. PT and orthopedic referral placed. Use tylenol as needed for pain. F/U with PCP soon.

## 2019-12-08 NOTE — Assessment & Plan Note (Addendum)
I reviewed his record. Record and lab reviewed. This was restarted in April by Dr. Shan Levans given his abnormal FLP.  He has refills on file. He will go to the pharmacy to pick up his meds.

## 2019-12-08 NOTE — Progress Notes (Signed)
    SUBJECTIVE:   CHIEF COMPLAINT / HPI:   Back Pain This is a chronic problem. Episode onset: Started few months ago. The problem has been gradually worsening since onset. The pain is present in the lumbar spine. Quality: dull aching pain. Radiates to: Radiate to the right hip. The pain is at a severity of 5/10. The pain is moderate. The symptoms are aggravated by bending, sitting and lying down. Associated symptoms include paresthesias and weakness. Pertinent negatives include no bladder incontinence, bowel incontinence, fever or numbness. He has tried nothing for the symptoms.  Right leg sometimes falls asleep, he has falling multiple times.   HLD: Not on Lipitor. He stated, he didn't know he was meant to take it. He requested refills.   PERTINENT  PMH / PSH: PMX reviewed.  OBJECTIVE:   BP 112/62   Pulse 84   Wt 260 lb 6.4 oz (118.1 kg)   SpO2 95%   BMI 37.36 kg/m   Physical Exam Vitals and nursing note reviewed.  Cardiovascular:     Pulses: Normal pulses.     Heart sounds: Normal heart sounds. No murmur.  Pulmonary:     Effort: Pulmonary effort is normal. No respiratory distress.     Breath sounds: Normal breath sounds. No wheezing or rhonchi.  Abdominal:     General: Bowel sounds are normal.     Palpations: Abdomen is soft.     Tenderness: There is no abdominal tenderness.     Comments: Obese  Musculoskeletal:     Right lower leg: No edema.     Left lower leg: No edema.      ASSESSMENT/PLAN:   Chronic lumbar radiculopathy No neurologic deficit on exam. Given intermittent weakness of his LL, I recommended MRI instead of an xray. MRI ordered and was scheduled during this visit. PT and orthopedic referral placed. Use tylenol as needed for pain. F/U with PCP soon.  Hyperlipidemia associated with type 2 diabetes mellitus ([place]) I reviewed his record. Record and lab reviewed. This was restarted in April by Dr. Shan Levans given his abnormal FLP.  He has refills on  file. He will go to the pharmacy to pick up his meds.     Andrena Mews, MD [place]

## 2019-12-08 NOTE — Patient Instructions (Signed)

## 2019-12-12 ENCOUNTER — Telehealth (INDEPENDENT_AMBULATORY_CARE_PROVIDER_SITE_OTHER): Payer: Medicare Other | Admitting: Psychiatry

## 2019-12-12 ENCOUNTER — Encounter (HOSPITAL_COMMUNITY): Payer: Self-pay | Admitting: Psychiatry

## 2019-12-12 ENCOUNTER — Other Ambulatory Visit: Payer: Self-pay

## 2019-12-12 DIAGNOSIS — F330 Major depressive disorder, recurrent, mild: Secondary | ICD-10-CM | POA: Diagnosis not present

## 2019-12-12 DIAGNOSIS — F251 Schizoaffective disorder, depressive type: Secondary | ICD-10-CM | POA: Diagnosis not present

## 2019-12-12 MED ORDER — DEPAKOTE ER 250 MG PO TB24: 500.0000 mg | ORAL_TABLET | Freq: Two times a day (BID) | ORAL | 0 refills | Status: DC

## 2019-12-12 MED ORDER — BENZTROPINE MESYLATE 1 MG PO TABS: 1.0000 mg | ORAL_TABLET | Freq: Every day | ORAL | 0 refills | Status: DC

## 2019-12-12 MED ORDER — PERPHENAZINE 8 MG PO TABS: 24.0000 mg | ORAL_TABLET | Freq: Every day | ORAL | 0 refills | Status: DC

## 2019-12-12 MED ORDER — PHENELZINE SULFATE 15 MG PO TABS: 45.0000 mg | ORAL_TABLET | Freq: Two times a day (BID) | ORAL | 0 refills | Status: DC

## 2019-12-12 NOTE — Progress Notes (Signed)
Virtual Visit via Telephone Note  I connected with [name] on 12/12/19 at 10:20 AM EDT by telephone and verified that I am speaking with the correct person using two identifiers.   I discussed the limitations, risks, security and privacy concerns of performing an evaluation and management service by telephone and the availability of in person appointments. I also discussed with the patient that there may be a patient responsible charge related to this service. The patient expressed understanding and agreed to proceed.  Patient location; home Provider location; home office  History of Present Illness: Patient is evaluated by phone session.  She is taking the medication but admitted sometimes get frustrated with her pharmacy who does not provide medication on time.  I saw his notes his medicines were given on time and he should have refills remaining.  He has not picked up the Lipitor from his PCP because he told the pharmacy did not give him the medicine.  However he feels the current psych medicine is working very well.  He is sleeping good.  He denies any paranoia, hallucination, crying spells and feeling of hopelessness.  He reported no side effects from the medication.  He does not want to change the medication.  Recently he had blood work and his Depakote level was 78.  His hemoglobin A1c is 6.3.  His BUN/creatinine is normal.  His triglycerides and cholesterol is high.  He also gained weight which he described the reason as not being as active.  He does visit his mother who lives in [place] who recently had surgery.  Patient denies drinking or using any illegal substances.  Like to keep his current medication.     Past Psychiatric History:Reviewed. H/Omultiple inpatient from [phone] for severe depression, suicidal thoughts. Noh/osuicidal attempt. H/O OCD, schizoaffective disorder and major depression. Tried numerous medication including TCAs. We tried Lamictal but he develop a  rash.   Recent Results (from the past 2160 hour(s))  HgB A1c     Status: None   Collection Time: 11/02/19  3:45 PM  Result Value Ref Range   Hemoglobin A1C     HbA1c POC (<> result, manual entry)     HbA1c, POC (prediabetic range)     HbA1c, POC (controlled diabetic range) 6.3 0.0 - 7.0 %  Valproic acid level     Status: None   Collection Time: 11/02/19  5:11 PM  Result Value Ref Range   Valproic Acid Lvl 78 50 - 100 ug/mL    Comment:                                 Detection Limit = 4                            <4 indicates None Detected Toxicity may occur at levels of 100-500. Measurements of free unbound valproic acid may improve the assess- ment of clinical response.   CBC     Status: None   Collection Time: 11/02/19  5:11 PM  Result Value Ref Range   WBC 8.2 3.4 - 10.8 x10E3/uL   RBC 5.17 4.14 - 5.80 x10E6/uL   Hemoglobin 15.6 13.0 - 17.7 g/dL   Hematocrit 46.5 37.5 - 51.0 %   MCV 90 79 - 97 fL   MCH 30.2 26.6 - 33.0 pg   MCHC 33.5 31.5 - 35.7 g/dL   RDW 14.0  11.6 - 15.4 %   Platelets 187 150 - 450 [id number]/GU  Basic Metabolic Panel     Status: Abnormal   Collection Time: 11/02/19  5:11 PM  Result Value Ref Range   Glucose 112 (H) 65 - 99 mg/dL   BUN 13 8 - 27 mg/dL   Creatinine, Ser 0.92 0.76 - 1.27 mg/dL   GFR calc non Af Amer 89 >59 mL/min/1.73   GFR calc Af Amer 103 >59 mL/min/1.73    Comment: **[facility] currently reports eGFR in compliance with the current**   recommendations of the [employer]. [facility] will   update reporting as new guidelines are published from the NKF-ASN   Task force.    BUN/Creatinine Ratio 14 10 - 24   Sodium 139 134 - 144 mmol/L   Potassium 4.4 3.5 - 5.2 mmol/L   Chloride 97 96 - 106 mmol/L   CO2 27 20 - 29 mmol/L   Calcium 9.4 8.6 - 10.2 mg/dL  TSH     Status: None   Collection Time: 11/02/19  5:11 PM  Result Value Ref Range   TSH 1.960 0.450 - 4.500 uIU/mL  Lipid Panel     Status: Abnormal   Collection Time:  11/02/19  5:11 PM  Result Value Ref Range   Cholesterol, Total 207 (H) 100 - 199 mg/dL   Triglycerides 212 (H) 0 - 149 mg/dL   HDL 31 (L) >39 mg/dL   VLDL Cholesterol Cal 38 5 - 40 mg/dL   LDL Chol Calc (NIH) 138 (H) 0 - 99 mg/dL   Chol/HDL Ratio 6.7 (H) 0.0 - 5.0 ratio    Comment:                                   T. Chol/HDL Ratio                                             Men  Women                               1/2 Avg.Risk  3.4    3.3                                   Avg.Risk  5.0    4.4                                2X Avg.Risk  9.6    7.1                                3X Avg.Risk 23.4   11.0      Psychiatric Specialty Exam: Physical Exam  Review of Systems  Weight 260 lb (117.9 kg).There is no height or weight on file to calculate BMI.  General Appearance: NA  Eye Contact:  NA  Speech:  Slow  Volume:  Decreased  Mood:  Euthymic  Affect:  NA  Thought Process:  Goal Directed  Orientation:  Full (Time, Place, and Person)  Thought Content:  Rumination  Suicidal Thoughts:  No  Homicidal Thoughts:  No  Memory:  Immediate;   Fair Recent;   Fair Remote;   Fair  Judgement:  Fair  Insight:  Fair  Psychomotor Activity:  NA  Concentration:  Concentration: Fair and Attention Span: Fair  Recall:  [employer] of Knowledge:  Fair  Language:  Good  Akathisia:  No  Handed:  Right  AIMS (if indicated):     Assets:  Communication Skills Desire for Improvement Housing Social Support  ADL's:  Intact  Cognition:  WNL  Sleep:   ok      Assessment and Plan: Major depressive disorder, recurrent.  Schizoaffective disorder, depressed type.  I reviewed blood work results.  His hemoglobin A1c is stable.  His Depakote level is 78.  Reinforced that he should talk to the pharmacist about medication refills since he has trouble getting the medication on time.  I encouraged healthy lifestyle and start walking every day which she promised to do it.  He does not want to change  medication.  I will continue Trilafon 24 mg at bedtime, Nardil 45 mg twice a day and Cogentin 1 mg daily, Depakote 500 mg twice a day.  Discussed medication side effects and benefits.  Patient is on MRI for a long time and he knows medication side effects and their diet treats restrictions.  Patient is not interested in therapy.  Recommended to call us back if is any question or any concern.  Follow-up in 3 months.   Follow Up Instructions:    I discussed the assessment and treatment plan with the patient. The patient was provided an opportunity to ask questions and all were answered. The patient agreed with the plan and demonstrated an understanding of the instructions.   The patient was advised to call back or seek an in-person evaluation if the symptoms worsen or if the condition fails to improve as anticipated.  I provided 20 minutes of non-face-to-face time during this encounter.   Kathlee Nations, MD

## 2019-12-19 ENCOUNTER — Other Ambulatory Visit: Payer: Self-pay

## 2019-12-19 ENCOUNTER — Ambulatory Visit (HOSPITAL_COMMUNITY)
Admission: RE | Admit: 2019-12-19 | Discharge: 2019-12-19 | Disposition: A | Discharge status: Home / Self Care | Payer: Medicare Other | Source: Ambulatory Visit | Attending: Family Medicine | Admitting: Family Medicine

## 2019-12-19 DIAGNOSIS — M5416 Radiculopathy, lumbar region: Secondary | ICD-10-CM | POA: Diagnosis not present

## 2019-12-19 DIAGNOSIS — M545 Low back pain: Secondary | ICD-10-CM | POA: Diagnosis not present

## 2019-12-20 ENCOUNTER — Telehealth: Payer: Self-pay | Admitting: Family Medicine

## 2019-12-20 NOTE — Telephone Encounter (Signed)
HIPAA compliant callback message left.   NB: If he calls back, try to connect him with me to discuss his MRI report or have him f/u soon with his PCP.  He does have orthopedic appointment coming up which is great.  Advise ED visit if back pain worsens or if he develops new leg weakness or numbness.

## 2019-12-25 ENCOUNTER — Telehealth: Payer: Self-pay | Admitting: Family Medicine

## 2019-12-25 NOTE — Telephone Encounter (Signed)
I discussed MRI lumbar spine with him.  He denies any acute change in his symptoms. No Bowel or bladder dysfunction.  I reminded him of his orthopedic appointment for 6/29.  He said he will be out of town to help his mother who is undergoing surgery for two weeks.  I advised him to call his orthopedic's office to reschedule his appointment.  I discussed PT referral placed on 12/08/19. It appears that they called him to schedule an appointment and LVM for him to call for scheduling. He was advised to schedule PT as well.  ED precaution discussed.  F/U with PCP discussed. Appointment made for him.  He was able to read back all information provided during this telephone conversation.

## 2020-01-02 ENCOUNTER — Ambulatory Visit: Payer: Medicare Other | Admitting: Orthopaedic Surgery

## 2020-01-15 ENCOUNTER — Ambulatory Visit: Payer: Medicare Other | Admitting: Family Medicine

## 2020-01-16 ENCOUNTER — Ambulatory Visit (INDEPENDENT_AMBULATORY_CARE_PROVIDER_SITE_OTHER): Payer: Medicare Other | Admitting: Orthopaedic Surgery

## 2020-01-16 VITALS — BP 138/105 | HR 66 | Ht 70.0 in | Wt 260.0 lb

## 2020-01-16 DIAGNOSIS — M5416 Radiculopathy, lumbar region: Secondary | ICD-10-CM

## 2020-01-16 DIAGNOSIS — M48061 Spinal stenosis, lumbar region without neurogenic claudication: Secondary | ICD-10-CM | POA: Insufficient documentation

## 2020-01-16 NOTE — Progress Notes (Signed)
Office Visit Note   Patient: [name]           Date of Birth: 21-Jul-1960           MRN: [id number] Visit Date: 01/16/2020              Requested by: Kinnie Feil, MD [address] [place],  [place] [address] PCP: Matilde Haymaker, MD   Assessment & Plan: Visit Diagnoses:  1. Chronic lumbar radiculopathy   2. Spinal stenosis of lumbar region without neurogenic claudication   The patient meets the AMA guidelines for Morbid (severe) obesity with a BMI > 40.0 and I have recommended weight loss.   Plan: We will set patient up for some physical therapy to work on lower extremity weakness with history of falls.  MRI scan results were reviewed he does have some moderate stenosis at L2-3.  We can recheck him in 6 weeks.  Follow-Up Instructions: Return in about 6 weeks (around 02/27/2020).   Orders:  No orders of the defined types were placed in this encounter.  No orders of the defined types were placed in this encounter.     Procedures: No procedures performed   Clinical Data: No additional findings.   Subjective: Chief Complaint  Patient presents with  . Lower Back - Pain    HPI 59 year old male complains of back pain and bilateral leg radicular symptoms.  He states he is fallen several times in the past year he states his legs tend to go numb a lot does not seem to really correlate with claudication.  He states his right hip is been popping a lot he also thinks he has rotator cuff tears since he has discomfort and weakness with overhead activity.  No history of specific injuries to his shoulders although he has had the falls.  Last A1c in May was 6.4.  Patient has never had any scans for his rotator cuff.  Legs and back giving more problems than his shoulders currently.  Patient has diabetes with hyperlipidemia and obesity BMI greater than 40.  Also positive for schizoaffective disorder depression type.  Review of Systems all other systems are negative as they  pertain to HPI.  Of note is history of atrial fibrillation.   Objective: Vital Signs: BP (!) 138/105   Pulse 66   Ht 5\' 10"  (1.778 m)   Wt 260 lb (117.9 kg)   BMI 37.31 kg/m   Physical Exam Constitutional:      Appearance: He is well-developed.  HENT:     Head: Normocephalic and atraumatic.  Eyes:     Pupils: Pupils are equal, round, and reactive to light.  Neck:     Thyroid: No thyromegaly.     Trachea: No tracheal deviation.  Cardiovascular:     Rate and Rhythm: Normal rate.  Pulmonary:     Effort: Pulmonary effort is normal.     Breath sounds: No wheezing.  Abdominal:     General: Bowel sounds are normal.     Palpations: Abdomen is soft.  Skin:    General: Skin is warm and dry.     Capillary Refill: Capillary refill takes less than 2 seconds.  Neurological:     Mental Status: He is alert and oriented to person, place, and time.  Psychiatric:        Behavior: Behavior normal.        Thought Content: Thought content normal.        Judgment: Judgment normal.  Ortho Exam patient has some difficulty with overhead reaching with positive impingement both right and left mild to moderate.  No subscap weakness.  Good cervical range of motion.  Good quad strength and abductor strength.  He has some weakness quads with single step up casting.  Specialty Comments:  No specialty comments available.  Imaging: Narrative & Impression  CLINICAL DATA:  Chronic low back pain and bilateral lower extremity discomfort.  EXAM: MRI LUMBAR SPINE WITHOUT CONTRAST  TECHNIQUE: Multiplanar, multisequence MR imaging of the lumbar spine was performed. No intravenous contrast was administered.  COMPARISON:  CT abdomen and pelvis 03/24/2015.  FINDINGS: Segmentation:  Standard.  Alignment:  Maintained.  Vertebrae: No fracture, evidence of discitis, or bone lesion. Bulky ossification of the anterior longitudinal ligament at multiple levels consistent with DISH  noted.  Conus medullaris and cauda equina: Conus extends to the L1 level. Conus and cauda equina appear normal.  Paraspinal and other soft tissues: Right renal cysts noted.  Disc levels:  T11-12 is imaged in the sagittal plane only and negative.  T12-L1: Negative.  L1-2: Negative.  L2-3: There is bulky ligamentum flavum thickening, moderate facet arthropathy and a minimal disc bulge. Moderate central canal stenosis is mainly due to ligamentum flavum thickening. The foramina are open.  L3-4: There appears to be autologous fusion across the disc interspace. No stenosis. Mild to moderate facet arthropathy noted.  L4-5: Minimal disc bulge and moderate facet degenerative change without stenosis.  L5-S1: Moderate to moderately severe facet arthropathy. There is a shallow central protrusion and annular fissure but no stenosis.  IMPRESSION: Moderate central canal stenosis at L2-3 is mainly due to ligamentum flavum thickening with a shallow disc bulge also identified.  Shallow central protrusion at L5-S1 without stenosis.  DISH      PMFS History: Patient Active Problem List   Diagnosis Date Noted  . Spinal stenosis of lumbar region 01/16/2020  . Chronic lumbar radiculopathy 12/08/2019  . Hyperlipidemia associated with type 2 diabetes mellitus ([place]) 12/08/2019  . Dizziness 11/03/2019  . Paronychia of left index finger 05/19/2019  . Fall at home 11/24/2018  . Rosacea 10/24/2018  . Loss of weight 09/09/2018  . Intertrigo 04/08/2018  . Schizoaffective disorder, depressive type ([place]) 04/08/2018  . Cough 10/20/2017  . Left foot pain 04/16/2017  . Contact dermatitis 04/16/2017  . Varicose veins of bilateral lower extremities with other complications 16/04/9603  . Erectile dysfunction 04/26/2015  . Hypothyroidism   . Seizures ([place])   . Depression   . Persistent atrial fibrillation ([place])   . Obesity, Class III, BMI 40-49.9 (morbid obesity) ([place])   .  Seborrheic dermatitis 02/10/2007  . T2DM (type 2 diabetes mellitus) ([place]) 02/10/2007   Past Medical History:  Diagnosis Date  . A-fib ([place])   . Arrhythmia   . Chronic a-fib ([place])   . Closed fracture of left distal radius   . Depression   . Depression   . Hypothyroidism   . Low back pain   . Morbid obesity, BMI unknown ([place])   . Osteoarthritis    oa in bilateral knees  . Schizophrenia ([place])   . Seizures ([place])   . Thyroid disease   . Varicose veins     Family History  Problem Relation Age of Onset  . Diabetes Father   . Heart disease Father   . Diabetes Brother   . OCD Mother     Past Surgical History:  Procedure Laterality Date  . ACHILLES TENDON REPAIR  approx. 2004  right foot  . CATARACT EXTRACTION     bilateral  . OPEN REDUCTION INTERNAL FIXATION (ORIF) DISTAL RADIAL FRACTURE Left 07/21/2018   Procedure: OPEN REDUCTION INTERNAL FIXATION (ORIF) DISTAL RADIAL FRACTURE;  Surgeon: [name], MD;  Location: [facility];  Service: Orthopedics;  Laterality: Left;  . stab phlebectomy  Right 11/09/2016   stab phlebectomy R leg by Tinnie Gens MD  . [place] History   Occupational History  . Occupation: disabled  Tobacco Use  . Smoking status: Never Smoker  . Smokeless tobacco: Never Used  Vaping Use  . Vaping Use: Never used  Substance and Sexual Activity  . Alcohol use: No    Alcohol/week: 0.0 standard drinks  . Drug use: No  . Sexual activity: Never

## 2020-01-25 ENCOUNTER — Encounter: Payer: Self-pay | Admitting: Family Medicine

## 2020-01-25 ENCOUNTER — Ambulatory Visit (INDEPENDENT_AMBULATORY_CARE_PROVIDER_SITE_OTHER): Payer: Medicare Other | Admitting: Family Medicine

## 2020-01-25 ENCOUNTER — Other Ambulatory Visit: Payer: Self-pay

## 2020-01-25 VITALS — BP 132/92 | HR 94 | Ht 70.0 in | Wt 262.4 lb

## 2020-01-25 DIAGNOSIS — M5416 Radiculopathy, lumbar region: Secondary | ICD-10-CM | POA: Diagnosis not present

## 2020-01-25 DIAGNOSIS — M25551 Pain in right hip: Secondary | ICD-10-CM | POA: Diagnosis not present

## 2020-01-25 DIAGNOSIS — L0291 Cutaneous abscess, unspecified: Secondary | ICD-10-CM

## 2020-01-25 DIAGNOSIS — M25559 Pain in unspecified hip: Secondary | ICD-10-CM | POA: Diagnosis not present

## 2020-01-25 DIAGNOSIS — E119 Type 2 diabetes mellitus without complications: Secondary | ICD-10-CM

## 2020-01-25 LAB — POCT GLYCOSYLATED HEMOGLOBIN (HGB A1C): HbA1c, POC (controlled diabetic range): 6.2 % (ref 0.0–7.0)

## 2020-01-25 MED ORDER — DICLOFENAC SODIUM 1 % EX GEL: 4.0000 g | Freq: Four times a day (QID) | CUTANEOUS | 0 refills | Status: DC

## 2020-01-25 NOTE — Progress Notes (Signed)
    SUBJECTIVE:   CHIEF COMPLAINT / HPI:   Big Sore Under Armpit  Patient reports he saw this sore a couple of days ago. He noticed it first in the mirror. He reports never having this previously.  No history of abscess or boils.  Does report pain with palpation but otherwise does not cause much discomfort.  No fevers, chills, nausea, vomiting.  Has not increased greatly in size.  Right hip issue Patient has been seen previously for this.  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  Additionally, in bed, he has pain in his lateral right hip when he lays on it.  Sometimes he feels like there is something pop being back-and-forth when moving it.   PERTINENT  PMH / PSH: A fib, T2DM, HLD, hypothyroidism, seizures, history of Achilles tendon procedure  OBJECTIVE:   BP (!) 132/92   Pulse 94   Ht 5\' 10"  (1.778 m)   Wt (!) 262 lb 6.4 oz (119 kg)   SpO2 95%   BMI 37.65 kg/m   General: Well-appearing male, no acute distress, elevated BMI Skin: 1 cm by half centimeter oblong palpable mass that spontaneously drained serosanguineous fluid on posterior axillary line on right side just inferior of to axilla. No surrounding erythema or evidence of cellulitis. Hips: Pain with palpation directly to right greater trochanter.  Patient does not have any other tenderness to palpation of bony structures or large muscular groups.  Patient has good active and passive range of motion at hips.  Strength symmetric and full bilaterally.  ASSESSMENT/PLAN:   Chronic lumbar radiculopathy Patient is reporting right lower extremity issues after driving and sitting in the car for about 30 minutes.  He does have to pull over as his leg falls asleep.  Patient recently had MRI given his chronic pain which showed some disc herniation.  Patient would benefit from physical therapy for disc herniation.  There is no  obvious weakness in the lower extremities or muscular atrophy making myelopathy unlikely.  Right hip pain Patient reports lateral right hip pain and PE is consistent with greater trochanteric bursitis.  Patient also complains about bilateral shoulder issues.  Given multiple sports medicine complaints, will refer patient to sports medicine for further evaluation and management of symptoms. Additionally, will provide voltaren gel for pain. Patient's kidney function wnl and should take tylenol with aleve sparingly.   Abscess Abscess located on right posterior axillary line just inferior of axilla.  The lesion is self draining without any evidence of further infection or cellulitis.  No lymphadenopathy in the area.  Will treat conservatively with warm compress.  Patient given return precautions.  No antibiotics needed at this time.  T2DM (type 2 diabetes mellitus) ([place]) Patient requested to have hemoglobin A1c drawn today.  He is aware that we we do not have time to discuss this with his other acute issues today.  He is understanding.  He will make an appointment with Dr. Pilar Plate for further chronic disease follow-up as he has multiple comorbidities.    Wilber Oliphant, MD [facility]

## 2020-01-25 NOTE — Patient Instructions (Signed)
For the sore under your right arm, please place a warm compress on to this multiple times a day to help it drain.  Once it drains completely, it will likely heal on its own. If it appears to be getting more red or bigger, please let us know so that we can manage it appropriately.    Please make a follow-up visit with Dr. Pilar Plate for your chronic medical conditions.  We change her A1c today and we will call you with the results.  For the hip pain and shoulder pain, I think you would benefit greatly from the sports medicine doctors.  I am going to send you a referral over there.  It is likely that you have some bursitis in your right hip where we passed on today and you are feeling pain.  That is called trochanteric bursitis.  For now, you can use Aleve over-the-counter, however I do not want you taking this multiple times a day for several weeks.  Please limit this to when you have pain.  You can also add on Tylenol to help with pain.  For the bulging discs in your back.  I am going to send you to physical therapy to help with this.

## 2020-01-26 ENCOUNTER — Encounter: Payer: Self-pay | Admitting: Family Medicine

## 2020-01-26 DIAGNOSIS — M25551 Pain in right hip: Secondary | ICD-10-CM | POA: Insufficient documentation

## 2020-01-26 DIAGNOSIS — M25559 Pain in unspecified hip: Secondary | ICD-10-CM | POA: Insufficient documentation

## 2020-01-26 DIAGNOSIS — L0291 Cutaneous abscess, unspecified: Secondary | ICD-10-CM | POA: Insufficient documentation

## 2020-01-26 NOTE — Assessment & Plan Note (Signed)
Abscess located on right posterior axillary line just inferior of axilla.  The lesion is self draining without any evidence of further infection or cellulitis.  No lymphadenopathy in the area.  Will treat conservatively with warm compress.  Patient given return precautions.  No antibiotics needed at this time.

## 2020-01-26 NOTE — Assessment & Plan Note (Signed)
Patient is reporting right lower extremity issues after driving and sitting in the car for about 30 minutes.  He does have to pull over as his leg falls asleep.  Patient recently had MRI given his chronic pain which showed some disc herniation.  Patient would benefit from physical therapy for disc herniation.  There is no obvious weakness in the lower extremities or muscular atrophy making myelopathy unlikely.

## 2020-01-26 NOTE — Assessment & Plan Note (Signed)
Patient requested to have hemoglobin A1c drawn today.  He is aware that we we do not have time to discuss this with his other acute issues today.  He is understanding.  He will make an appointment with Dr. Pilar Plate for further chronic disease follow-up as he has multiple comorbidities.

## 2020-01-26 NOTE — Assessment & Plan Note (Addendum)
Patient reports lateral right hip pain and PE is consistent with greater trochanteric bursitis.  Patient also complains about bilateral shoulder issues.  Given multiple sports medicine complaints, will refer patient to sports medicine for further evaluation and management of symptoms. Additionally, will provide voltaren gel for pain. Patient's kidney function wnl and should take tylenol with aleve sparingly.

## 2020-01-27 ENCOUNTER — Other Ambulatory Visit: Payer: Self-pay | Admitting: Family Medicine

## 2020-01-27 DIAGNOSIS — M109 Gout, unspecified: Secondary | ICD-10-CM

## 2020-02-02 ENCOUNTER — Encounter: Payer: Self-pay | Admitting: Family Medicine

## 2020-02-02 ENCOUNTER — Ambulatory Visit (INDEPENDENT_AMBULATORY_CARE_PROVIDER_SITE_OTHER): Payer: Medicare Other | Admitting: Family Medicine

## 2020-02-02 ENCOUNTER — Other Ambulatory Visit: Payer: Self-pay

## 2020-02-02 VITALS — BP 125/88 | Ht 70.5 in | Wt 262.0 lb

## 2020-02-02 DIAGNOSIS — M7062 Trochanteric bursitis, left hip: Secondary | ICD-10-CM

## 2020-02-02 DIAGNOSIS — M7061 Trochanteric bursitis, right hip: Secondary | ICD-10-CM

## 2020-02-02 DIAGNOSIS — M25562 Pain in left knee: Secondary | ICD-10-CM | POA: Diagnosis not present

## 2020-02-02 DIAGNOSIS — M79671 Pain in right foot: Secondary | ICD-10-CM | POA: Diagnosis not present

## 2020-02-02 DIAGNOSIS — M25561 Pain in right knee: Secondary | ICD-10-CM | POA: Diagnosis not present

## 2020-02-02 NOTE — Patient Instructions (Signed)
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 Avoid painful activities as much as possible. Ice over area of pain 3-4 times a day for 15 minutes at a time Hip side raise exercise 3 sets of 10 once a day - add weights if this becomes too easy. Stretches - pick 2-3 and hold for 20-30 seconds x 3 - do once or twice a day. Voltaren gel up to 4 times a day. Start physical therapy and do home exercises on days you don't go to therapy. If not improving, can consider steroid injection. Follow up with me in 6 weeks.  Your ankle pain is due to extensor tendinopathy but this seems to be improving.

## 2020-02-02 NOTE — Progress Notes (Signed)
Office Visit Note   Patient: [name]           Date of Birth: Feb 06, 1961           MRN: [id number] Visit Date: 02/02/2020 Requested by: Zenia Resides, MD [address] [place],  [place] [address] PCP: Matilde Haymaker, MD  Subjective: Bilateral hip pain, Right foot pain  HPI: 59 year old male presenting to clinic today with concerns of 1 year of bilateral hip pain, R>L, as well as right foot pain. Patient states that his hip pain has slowly gotten worse over the past year, and is now occasionally associated with a "popping" sensation on the outside of his hip.  Pain is worsened by walking up and down stairs, and he lives on the upper level of an apartment building which does not have an elevator.  He also states that his pain is significant when laying down at night, and he is now unable to lay on his right side at all to go to sleep.  He has not tried anything for this pain thus far.  He states that previously he would try and walk during the day, but he has stopped this because he was scared that the popping meant that he was going to break something within his hip.  He states the popping itself is not painful.  States the pain is located primarily on the outside of his hip, and does not radiate deep into his groin.  Denies any trauma around the onset of his pain, though he does have a history of several falls in the past. Per his right foot, patient states that several weeks ago, his right foot was severely painful and "nearly hobbled me."  He states that now he has very little pain in this foot, and indeed was not sure which foot it was that was painful.  States that at its peak, it hurt to pull his foot up towards his head.  This slowly improved on its own.  No trauma to his foot, no swelling or bruising.              ROS:   All other systems were reviewed and are negative.  Objective: Vital Signs: BP (!) 125/88   Ht 5' 10.5" (1.791 m)   Wt (!) 262 lb (118.8 kg)   BMI  37.06 kg/m   Physical Exam:  General:  Alert and oriented, in no acute distress. Cardiac: Appears well perfused. Pulm:  Breathing unlabored. Psy:  Normal mood, congruent affect. Skin: No rashes or bruising. Normal gait.  Normal spinal curvature. No pain with logroll on either hip.  Does endorse low back pain with extreme hip flexion, but this does not travel into his groin.  No groin pain with FADIR/FABER.  Mild crepitus appreciated with compression of hip, internal and external rotation.  This is nonpainful. Tenderness to palpation amongst gluteal musculature, R>L. Significant tenderness to palpation over right greater trochanter, additionally with some tenderness to palpation over left greater trochanter but less so. Tenderness to palpation at bilateral iliotibial band insertion site at lateral knee.  Right foot with no obvious deformity.  No swelling or bruising.  Tenderness to palpation along extensor tendons, which is worsened with resisted foot dorsiflexion.  Full range of motion at the ankle.  No tenderness to palpation amongst medial deltoid ligament complex, or lateral ligament complex.  No bony tenderness to palpation over medial or lateral malleoli.  Imaging: No results found.  Assessment & Plan: 59 year old  male presenting to clinic today with bilateral chronic hip pain and resolving right foot pain.  Examination as above which was consistent with painful gluteal musculature, tight IT bands, and greater trochanteric pain syndrome bilaterally.  Discussed that this is very amenable to stretching and strengthening exercises, and patient is agreeable to physical therapy.  Will place physical therapy referral. Patient's right foot is reportedly significantly improved today.  Indeed, patient initially could not remember which foot was causing him pain.  Discussed that this should continue to improve with more time.  Can apply ice if needed, and encouraged to return to clinic should it  worsen.  Patient had no further questions or concerns at the completion of his visit today.     Procedures: No procedures performed  No notes on file

## 2020-02-09 DIAGNOSIS — L718 Other rosacea: Secondary | ICD-10-CM | POA: Diagnosis not present

## 2020-02-09 DIAGNOSIS — L218 Other seborrheic dermatitis: Secondary | ICD-10-CM | POA: Diagnosis not present

## 2020-02-27 ENCOUNTER — Encounter: Payer: Self-pay | Admitting: Orthopaedic Surgery

## 2020-02-27 ENCOUNTER — Ambulatory Visit (INDEPENDENT_AMBULATORY_CARE_PROVIDER_SITE_OTHER): Payer: Medicare Other | Admitting: Orthopaedic Surgery

## 2020-02-27 VITALS — Ht 70.5 in | Wt 262.0 lb

## 2020-02-27 DIAGNOSIS — M48061 Spinal stenosis, lumbar region without neurogenic claudication: Secondary | ICD-10-CM

## 2020-02-27 DIAGNOSIS — M5416 Radiculopathy, lumbar region: Secondary | ICD-10-CM

## 2020-02-27 NOTE — Progress Notes (Signed)
Office Visit Note   Patient: [name]           Date of Birth: March 18, 1961           MRN: [id number] Visit Date: 02/27/2020              Requested by: Matilde Haymaker, MD [address] [place],   [address] PCP: Matilde Haymaker, MD   Assessment & Plan: Visit Diagnoses:  1. Chronic lumbar radiculopathy   2. Spinal stenosis of lumbar region without neurogenic claudication     Plan: Patient has some moderate stenosis at L2-3 which could be responsible for his quad weakness.  Some of this may be an activity.  We will send in a request again for therapy and I plan to recheck him again in 3 months.  Follow-Up Instructions: Return in about 3 months (around 05/29/2020).   Orders:  Orders Placed This Encounter  Procedures  . Ambulatory referral to Physical Therapy   No orders of the defined types were placed in this encounter.     Procedures: No procedures performed   Clinical Data: No additional findings.   Subjective: Chief Complaint  Patient presents with  . Lower Back - Follow-up    HPI 59 year old male returns with ongoing problems with back pain.  Patient had been scheduled physical therapy at the [facility].  Has history of schizoaffective disorder and somehow messed up in did not make any of his appointments.  He continues to have problems with pain in his back with stiffness difficulty when she first gets up.  He states once he is up and walking he actually moves a little bit better for period of time and then has increased symptoms with increased ambulation.  He has not had any falls since his last visit with me but prior to that visit he had several falls.  Review of Systems 14 point system update unchanged from 01/16/2020 office visit.  Negative for smokingor hypertension, negative for diabetes.  Objective: Vital Signs: Ht 5' 10.5" (1.791 m)   Wt 262 lb (118.8 kg)   BMI 37.06 kg/m   Physical Exam Constitutional:       Appearance: He is well-developed.  HENT:     Head: Normocephalic and atraumatic.  Eyes:     Pupils: Pupils are equal, round, and reactive to light.  Neck:     Thyroid: No thyromegaly.     Trachea: No tracheal deviation.  Cardiovascular:     Rate and Rhythm: Normal rate.  Pulmonary:     Effort: Pulmonary effort is normal.     Breath sounds: No wheezing.  Abdominal:     General: Bowel sounds are normal.     Palpations: Abdomen is soft.  Skin:    General: Skin is warm and dry.     Capillary Refill: Capillary refill takes less than 2 seconds.  Neurological:     Mental Status: He is alert and oriented to person, place, and time.  Psychiatric:        Behavior: Behavior normal.        Thought Content: Thought content normal.        Judgment: Judgment normal.     Ortho Exam negative logroll to the hips no swelling in the knees negative straight leg raising.  Right left quad testing shows right left quad weakness.  He has problems with single step step up with quad weakness.  Specialty Comments:  No specialty comments available.  Imaging: CLINICAL  DATA:  Chronic low back pain and bilateral lower extremity discomfort.  EXAM: MRI LUMBAR SPINE WITHOUT CONTRAST  TECHNIQUE: Multiplanar, multisequence MR imaging of the lumbar spine was performed. No intravenous contrast was administered.  COMPARISON:  CT abdomen and pelvis 03/24/2015.  FINDINGS: Segmentation:  Standard.  Alignment:  Maintained.  Vertebrae: No fracture, evidence of discitis, or bone lesion. Bulky ossification of the anterior longitudinal ligament at multiple levels consistent with DISH noted.  Conus medullaris and cauda equina: Conus extends to the L1 level. Conus and cauda equina appear normal.  Paraspinal and other soft tissues: Right renal cysts noted.  Disc levels:  T11-12 is imaged in the sagittal plane only and negative.  T12-L1: Negative.  L1-2: Negative.  L2-3: There is bulky  ligamentum flavum thickening, moderate facet arthropathy and a minimal disc bulge. Moderate central canal stenosis is mainly due to ligamentum flavum thickening. The foramina are open.  L3-4: There appears to be autologous fusion across the disc interspace. No stenosis. Mild to moderate facet arthropathy noted.  L4-5: Minimal disc bulge and moderate facet degenerative change without stenosis.  L5-S1: Moderate to moderately severe facet arthropathy. There is a shallow central protrusion and annular fissure but no stenosis.  IMPRESSION: Moderate central canal stenosis at L2-3 is mainly due to ligamentum flavum thickening with a shallow disc bulge also identified.  Shallow central protrusion at L5-S1 without stenosis.  DISH.   Electronically Signed   By: Inge Rise M.D.   On: 12/20/2019 09:36    PMFS History: Patient Active Problem List   Diagnosis Date Noted  . Right hip pain 01/26/2020  . Abscess 01/26/2020  . Spinal stenosis of lumbar region 01/16/2020  . Chronic lumbar radiculopathy 12/08/2019  . Hyperlipidemia associated with type 2 diabetes mellitus ([place]) 12/08/2019  . Dizziness 11/03/2019  . Paronychia of left index finger 05/19/2019  . Fall at home 11/24/2018  . Rosacea 10/24/2018  . Loss of weight 09/09/2018  . Intertrigo 04/08/2018  . Schizoaffective disorder, depressive type ([place]) 04/08/2018  . Cough 10/20/2017  . Left foot pain 04/16/2017  . Contact dermatitis 04/16/2017  . Varicose veins of bilateral lower extremities with other complications 73/71/0626  . Erectile dysfunction 04/26/2015  . Hypothyroidism   . Seizures ([place])   . Depression   . Persistent atrial fibrillation ([place])   . Obesity, Class III, BMI 40-49.9 (morbid obesity) ([place])   . Seborrheic dermatitis 02/10/2007  . T2DM (type 2 diabetes mellitus) ([place]) 02/10/2007   Past Medical History:  Diagnosis Date  . A-fib ([place])   . Arrhythmia   . Chronic a-fib ([place])   . Closed  fracture of left distal radius   . Depression   . Depression   . Hypothyroidism   . Low back pain   . Morbid obesity, BMI unknown ([place])   . Osteoarthritis    oa in bilateral knees  . Schizophrenia ([place])   . Seizures ([place])   . Thyroid disease   . Varicose veins     Family History  Problem Relation Age of Onset  . Diabetes Father   . Heart disease Father   . Diabetes Brother   . OCD Mother     Past Surgical History:  Procedure Laterality Date  . ACHILLES TENDON REPAIR  approx. 2004   right foot  . CATARACT EXTRACTION     bilateral  . OPEN REDUCTION INTERNAL FIXATION (ORIF) DISTAL RADIAL FRACTURE Left 07/21/2018   Procedure: OPEN REDUCTION INTERNAL FIXATION (ORIF) DISTAL RADIAL FRACTURE;  Surgeon: Fredna Dow,  Lennette Bihari, MD;  Location: [facility];  Service: Orthopedics;  Laterality: Left;  . stab phlebectomy  Right 11/09/2016   stab phlebectomy R leg by Tinnie Gens MD  . [place] History   Occupational History  . Occupation: disabled  Tobacco Use  . Smoking status: Never Smoker  . Smokeless tobacco: Never Used  Vaping Use  . Vaping Use: Never used  Substance and Sexual Activity  . Alcohol use: No    Alcohol/week: 0.0 standard drinks  . Drug use: No  . Sexual activity: Never

## 2020-03-13 ENCOUNTER — Encounter (HOSPITAL_COMMUNITY): Payer: Self-pay | Admitting: Psychiatry

## 2020-03-13 ENCOUNTER — Telehealth (INDEPENDENT_AMBULATORY_CARE_PROVIDER_SITE_OTHER): Payer: Medicare Other | Admitting: Psychiatry

## 2020-03-13 ENCOUNTER — Other Ambulatory Visit: Payer: Self-pay

## 2020-03-13 DIAGNOSIS — F251 Schizoaffective disorder, depressive type: Secondary | ICD-10-CM

## 2020-03-13 DIAGNOSIS — F330 Major depressive disorder, recurrent, mild: Secondary | ICD-10-CM | POA: Diagnosis not present

## 2020-03-13 MED ORDER — PHENELZINE SULFATE 15 MG PO TABS: 45.0000 mg | ORAL_TABLET | Freq: Two times a day (BID) | ORAL | 0 refills | Status: DC

## 2020-03-13 MED ORDER — PERPHENAZINE 8 MG PO TABS: 24.0000 mg | ORAL_TABLET | Freq: Every day | ORAL | 0 refills | Status: DC

## 2020-03-13 MED ORDER — DEPAKOTE ER 250 MG PO TB24: 500.0000 mg | ORAL_TABLET | Freq: Two times a day (BID) | ORAL | 0 refills | Status: DC

## 2020-03-13 MED ORDER — BENZTROPINE MESYLATE 1 MG PO TABS: 1.0000 mg | ORAL_TABLET | Freq: Every day | ORAL | 0 refills | Status: DC

## 2020-03-13 NOTE — Progress Notes (Signed)
Virtual Visit via Telephone Note  I connected with [name] on 03/13/20 at 10:20 AM EDT by telephone and verified that I am speaking with the correct person using two identifiers.  Location: Patient: home Provider: home office   I discussed the limitations, risks, security and privacy concerns of performing an evaluation and management service by telephone and the availability of in person appointments. I also discussed with the patient that there may be a patient responsible charge related to this service. The patient expressed understanding and agreed to proceed.   History of Present Illness: Patient is evaluated by phone session.  He is taking his medication as prescribed.  Sometimes he gets frustrated with the pharmacy when his pharmacy does not provide brand name Depakote.  He is sleeping okay.  Last week he fell when he was coming down from stairs and hurt his back.  Likely no major injury but he is scheduled to have physical therapy next week.  He feels the current medicine is working and denies any paranoia, voices, hallucination, anger or any crying spells.  Energy level is fair.  Recently he visited his mother who lives in [name].  Patient told her mother also not doing well and recently had a fall.  Patient is trying to lose weight and he just started 3 days ago so he is hoping in few weeks he may lose weight.  Recently he had blood work and his hemoglobin A1c is 6.2.  Patient does not want to change or reduce his medication since it.  They are working very well.  He denies any anger, paranoia or any suicidal thoughts.  He denies any major side effects.  He is very much aware about his dietary restriction.  Denies drinking or using any illegal substances.  He lives by himself.     Past Psychiatric History:Reviewed. H/Omultipleinpatientfrom 1979-1976forsevere depression, suicidal thoughts. Noh/osuicidal attempt.H/OOCD, schizoaffective disorder and major depression.  Tried numerous medication including TCAs. We tried Lamictal but he develop a rash.  Recent Results (from the past 2160 hour(s))  POCT glycosylated hemoglobin (Hb A1C)     Status: None   Collection Time: 01/25/20  4:58 PM  Result Value Ref Range   Hemoglobin A1C     HbA1c POC (<> result, manual entry)     HbA1c, POC (prediabetic range)     HbA1c, POC (controlled diabetic range) 6.2 0.0 - 7.0 %     Psychiatric Specialty Exam: Physical Exam  Review of Systems  Weight 262 lb (118.8 kg).There is no height or weight on file to calculate BMI.  General Appearance: NA  Eye Contact:  NA  Speech:  Slow  Volume:  Decreased  Mood:  Euthymic  Affect:  NA  Thought Process:  Descriptions of Associations: Intact  Orientation:  Full (Time, Place, and Person)  Thought Content:  Rumination  Suicidal Thoughts:  No  Homicidal Thoughts:  No  Memory:  Immediate;   Fair Recent;   Fair Remote;   Fair  Judgement:  Intact  Insight:  Shallow  Psychomotor Activity:  NA  Concentration:  Concentration: Fair and Attention Span: Fair  Recall:  Good  Fund of Knowledge:  Good  Language:  Good  Akathisia:  No  Handed:  Right  AIMS (if indicated):     Assets:  Communication Skills Desire for Improvement Housing Resilience  ADL's:  Intact  Cognition:  WNL  Sleep:   ok      Assessment and Plan: Schizoaffective disorder, depressed type.  Major  depressive disorder.  I reviewed blood work results.  His hemoglobin A1c is 7.2.  Discussed medication side effects.  Patient is started his diet and hoping that he will lose weight in few weeks.  He does not want to change medication since it is working.  He preferred Depakote brand name.  I will continue Depakote ER 350 mg 2 tablet in the morning and 2 tablets at bedtime, Cogentin 1 mg at bedtime, Trilafon 24 mg at bedtime and Nardil 25 mg twice a day.  Recommended to call us back if he has any questions or any concerns.  Follow-up in 3 months.   Follow Up  Instructions:    I discussed the assessment and treatment plan with the patient. The patient was provided an opportunity to ask questions and all were answered. The patient agreed with the plan and demonstrated an understanding of the instructions.   The patient was advised to call back or seek an in-person evaluation if the symptoms worsen or if the condition fails to improve as anticipated.  I provided 18 minutes of non-face-to-face time during this encounter.   Kathlee Nations, MD

## 2020-03-18 DIAGNOSIS — L718 Other rosacea: Secondary | ICD-10-CM | POA: Diagnosis not present

## 2020-03-18 DIAGNOSIS — L304 Erythema intertrigo: Secondary | ICD-10-CM | POA: Diagnosis not present

## 2020-03-18 DIAGNOSIS — L509 Urticaria, unspecified: Secondary | ICD-10-CM | POA: Diagnosis not present

## 2020-03-18 DIAGNOSIS — L218 Other seborrheic dermatitis: Secondary | ICD-10-CM | POA: Diagnosis not present

## 2020-03-19 ENCOUNTER — Ambulatory Visit: Payer: Medicare Other | Attending: Orthopaedic Surgery

## 2020-03-19 ENCOUNTER — Other Ambulatory Visit: Payer: Self-pay

## 2020-03-19 DIAGNOSIS — M6281 Muscle weakness (generalized): Secondary | ICD-10-CM | POA: Diagnosis not present

## 2020-03-19 DIAGNOSIS — G8929 Other chronic pain: Secondary | ICD-10-CM | POA: Diagnosis not present

## 2020-03-19 DIAGNOSIS — M5441 Lumbago with sciatica, right side: Secondary | ICD-10-CM | POA: Diagnosis not present

## 2020-03-19 NOTE — Patient Instructions (Addendum)
Access Code: [email or web address] URL: [email or web address] Date: 03/19/2020 Prepared by: Claiborne Billings  Exercises Supine Lower Trunk Rotation - 3 x daily - 7 x weekly - 3 reps - 1 sets - 20 hold Seated Hamstring Stretch - 3 x daily - 7 x weekly - 3 reps - 1 sets - 20 hold Sit to Stand - 2 x daily - 7 x weekly - 1 sets - 10 reps Seated Long Arc Quad - 3 x daily - 7 x weekly - 2 sets - 10 reps

## 2020-03-19 NOTE — Therapy (Signed)
[facility] [address], [place], [place], [address] Phone: [phone]   Fax:  [phone]  Physical Therapy Evaluation  Patient Details  Name: RAYVEN HENDRICKSON MRN: [id number] Date of Birth: 1960/11/07 Referring Provider (PT): Rodell Perna, MD   Encounter Date: 03/19/2020   PT End of Session - 03/19/20 1332    Visit Number 1    Date for PT Re-Evaluation 05/14/20    Authorization Type Medicare    Progress Note Due on Visit 10    PT Start Time 1234    PT Stop Time 1316    PT Time Calculation (min) 42 min    Activity Tolerance Patient tolerated treatment well    Behavior During Therapy [facility] for tasks assessed/performed           Past Medical History:  Diagnosis Date  . A-fib ([place])   . Arrhythmia   . Chronic a-fib ([place])   . Closed fracture of left distal radius   . Depression   . Depression   . Hypothyroidism   . Low back pain   . Morbid obesity, BMI unknown ([place])   . Osteoarthritis    oa in bilateral knees  . Schizophrenia ([place])   . Seizures ([place])   . Thyroid disease   . Varicose veins     Past Surgical History:  Procedure Laterality Date  . ACHILLES TENDON REPAIR  approx. 2004   right foot  . CATARACT EXTRACTION     bilateral  . OPEN REDUCTION INTERNAL FIXATION (ORIF) DISTAL RADIAL FRACTURE Left 07/21/2018   Procedure: OPEN REDUCTION INTERNAL FIXATION (ORIF) DISTAL RADIAL FRACTURE;  Surgeon: [name], MD;  Location: [facility];  Service: Orthopedics;  Laterality: Left;  . stab phlebectomy  Right 11/09/2016   stab phlebectomy R leg by Tinnie Gens MD  . TOOTH EXTRACTION      There were no vitals filed for this visit.    Subjective Assessment - 03/19/20 1239    Subjective Pt presents to PT with LBP and chronic LE weakness.  Pt has been diagnosed with lumbar spinal stenosis at L2-3.  Pt reports that he has had 2 falls in the past 6 months related to LE weakness.    Pertinent  History drepression, spinal stenosis, schizophrenia    Limitations Walking    How long can you sit comfortably? 30 minutes    How long can you stand comfortably? 20 minutes    How long can you walk comfortably? limited 10-15 minutes    Diagnostic tests MRI: spinal stenosis L2-3 and central disc bulge L5-S1    Patient Stated Goals improve ability to sit, stand and walk with less pain    Currently in Pain? Yes    Pain Score 0-No pain   "not really a pain"- just numbness in the Rt LE.  Weakness in the LEs   Pain Location Leg    Pain Orientation Right    Pain Descriptors / Indicators Numbness;Stabbing    Pain Type Chronic pain    Pain Onset More than a month ago    Pain Frequency Constant    Aggravating Factors  driving, sitting, standing, walking    Pain Relieving Factors change of position, rubbing leg              [facility] PT Assessment - 03/19/20 0001      Assessment   Medical Diagnosis chronic lumbar radiculoapthy    Referring Provider (PT) Rodell Perna, MD  Onset Date/Surgical Date 03/20/19    Next MD Visit none    Prior Therapy none      Precautions   Precautions Fall      Restrictions   Weight Bearing Restrictions No      Balance Screen   Has the patient fallen in the past 6 months Yes    How many times? 2   PT will treat LEs and balance    Has the patient had a decrease in activity level because of a fear of falling?  No    Is the patient reluctant to leave their home because of a fear of falling?  No      Home Environment   Living Environment Private residence    Living Arrangements Alone    Type of [place] to enter    [place] One level      Prior Function   Level of [place] On disability    Leisure attend hockey games       Cognition   Overall Cognitive Status Within Functional Limits for tasks assessed      Observation/Other Assessments   Focus on Therapeutic Outcomes (FOTO)  51% limitation       Posture/Postural Control   Posture/Postural Control Postural limitations    Postural Limitations Rounded Shoulders;Forward head;Flexed trunk;Weight shift left      ROM / Strength   AROM / PROM / Strength AROM;PROM;Strength      AROM   Overall AROM  Deficits    Overall AROM Comments reduced lumbar A/ROM in all directions by 50%, hip flexibility limited by 50%       PROM   Overall PROM  Deficits    Overall PROM Comments hip IR limited by 50%, hamstring flexibility limited by 50% without pain      Strength   Overall Strength Deficits    Overall Strength Comments Bil hip 4/5, Rt knee 4/5, Lt knee 4+/5, bil ankle 5/5      Palpation   Palpation comment no palpable tenderness today.  Tension in bil lumbar paraspinals and gluteals without pain      Special Tests    Special Tests Lumbar    Lumbar Tests Slump Test;Straight Leg Raise      Slump test   Findings Negative    Side Right      Straight Leg Raise   Findings Negative    Side  Right      Transfers   Transfers Stand to Sit;Sit to Stand    Sit to Stand With upper extremity assist    Stand to Sit With upper extremity assist      Ambulation/Gait   Ambulation/Gait Yes    Ambulation/Gait Assistance 6: Modified independent (Device/Increase time)    Gait Pattern Step-through pattern;Lateral hip instability;Decreased trunk rotation                      Objective measurements completed on examination: See above findings.               PT Education - 03/19/20 1257    Education Details Access Code: [email or web address]    Person(s) Educated Patient    Methods Explanation;Demonstration;Handout    Comprehension Verbalized understanding;Returned demonstration            PT Short Term Goals - 03/19/20 1330      PT SHORT TERM GOAL #1   Title be independent in initial  HEP    Time 4    Period Weeks    Status New    Target Date 04/16/20      PT SHORT TERM GOAL #2   Title improve strength to perform 5x  sit to stand in < or = to 17 seconds to improve safety    Time 4    Period Weeks    Status New    Target Date 04/16/20      PT SHORT TERM GOAL #3   Title improve strength to stand and walk for > or =to 20 minutes without limitation    Time 4    Period Weeks    Status New    Target Date 04/16/20             PT Long Term Goals - 03/19/20 1253      PT LONG TERM GOAL #1   Title be independent in advanced HEP    Time 8    Period Weeks    Status New    Target Date 05/14/20      PT LONG TERM GOAL #2   Title reduce FOTO to < or = to 41% limitation    Time 8    Period Weeks    Status New    Target Date 05/14/20      PT LONG TERM GOAL #3   Title improve LE strength and endurance to walk for 30 minutes to improve endurance in the community    Time 8    Period Weeks    Status New    Target Date 05/14/20      PT LONG TERM GOAL #4   Title improve 5x sit to stand to < or = to 15 seconds to reduce falls risk    Time 8    Period Weeks    Status New    Target Date 05/14/20      PT LONG TERM GOAL #5   Title improve LE strength to perform sit to stand transition without UE support and demonstrate control with descent    Time 8    Period Weeks    Status New    Target Date 05/14/20                  Plan - 03/19/20 1258    Clinical Impression Statement Pt presents to PT with LPB and Rt LE weakness due to spinal stenosis.  Pt has had 2 falls in the past 6 months due to Rt LE weakness.  Pt had recent MRI that showed spinal stenosis at L2-3 and L5-S1 central disc herniation.  Pt denies any pain, just Rt LE radiculopathy.  Pt is limited to sitting 30 minutes, standing 20 minutes and walking 10-15 minutes due to Rt LE symptoms and weakness with these activities.  Pt demonstrates limited lumbar and hip A/ROM and P/ROM without pain and has palpable tension without pain in bil lumbar spine and gluteals.  Pt with reduced hip strength and Rt>Lt knee strength bilaterally and requires  max UE support with sit to stand transition with control.  5x sit to stand is 21.73 seconds without UE support and demonstrates poor control and use of momentum to complete.  Pt will benefit from skilled PT to address weakness, balance, endurance and Rt LE radiculopathy.    Personal Factors and Comorbidities Comorbidity 2    Comorbidities spinal stenosis, depression, lives alone, schizophrenic disorder    Examination-Activity Limitations Stand;Squat;Sit;Locomotion Level;Transfers    Examination-Participation Restrictions [employer]  Activity;Driving;Yard Work;Shop    Stability/Clinical Decision Making Evolving/Moderate complexity    Clinical Decision Making Moderate    Rehab Potential Good    PT Frequency 2x / week    PT Duration 8 weeks    PT Treatment/Interventions ADLs/Self Care Home Management;Cryotherapy;Electrical Stimulation;Moist Heat;Gait training;Stair training;Functional mobility training;Therapeutic activities;Therapeutic exercise;Balance training;Neuromuscular re-education;Manual techniques;Patient/family education;Dry needling;Spinal Manipulations;Joint Manipulations;Taping    PT Next Visit Plan LE strength, balance, NuStep, work on sit to stand    PT Home Exercise Plan Access Code: [email or web address]    Consulted and Agree with Plan of Care Patient           Patient will benefit from skilled therapeutic intervention in order to improve the following deficits and impairments:  Abnormal gait, Decreased activity tolerance, Postural dysfunction, Decreased strength, Improper body mechanics, Impaired flexibility, Decreased balance, Pain, Increased muscle spasms, Decreased endurance, Decreased range of motion  Visit Diagnosis: Chronic bilateral low back pain with right-sided sciatica - Plan: PT plan of care cert/re-cert  Muscle weakness (generalized) - Plan: PT plan of care cert/re-cert     Problem List Patient Active Problem List   Diagnosis Date Noted  . Right hip pain 01/26/2020  .  Abscess 01/26/2020  . Spinal stenosis of lumbar region 01/16/2020  . Chronic lumbar radiculopathy 12/08/2019  . Hyperlipidemia associated with type 2 diabetes mellitus ([place]) 12/08/2019  . Dizziness 11/03/2019  . Paronychia of left index finger 05/19/2019  . Fall at home 11/24/2018  . Rosacea 10/24/2018  . Loss of weight 09/09/2018  . Intertrigo 04/08/2018  . Schizoaffective disorder, depressive type ([place]) 04/08/2018  . Cough 10/20/2017  . Left foot pain 04/16/2017  . Contact dermatitis 04/16/2017  . Varicose veins of bilateral lower extremities with other complications 59/56/3875  . Erectile dysfunction 04/26/2015  . Hypothyroidism   . Seizures ([place])   . Depression   . Persistent atrial fibrillation ([place])   . Obesity, Class III, BMI 40-49.9 (morbid obesity) ([place])   . Seborrheic dermatitis 02/10/2007  . T2DM (type 2 diabetes mellitus) ([place]) 02/10/2007   Sigurd Sos, PT 03/19/20 1:35 PM  [facility] [address], [place], [place], [address] Phone: [phone]   Fax:  [phone]  Name: JONTUE CRUMPACKER MRN: [id number] Date of Birth: Jun 27, 1961

## 2020-03-24 ENCOUNTER — Other Ambulatory Visit: Payer: Self-pay

## 2020-03-24 ENCOUNTER — Emergency Department (HOSPITAL_BASED_OUTPATIENT_CLINIC_OR_DEPARTMENT_OTHER)
Admission: EM | Admit: 2020-03-24 | Discharge: 2020-03-24 | Disposition: A | Discharge status: Home / Self Care | Payer: Medicare Other | Attending: Emergency Medicine | Admitting: Emergency Medicine

## 2020-03-24 ENCOUNTER — Encounter (HOSPITAL_BASED_OUTPATIENT_CLINIC_OR_DEPARTMENT_OTHER): Payer: Self-pay | Admitting: Emergency Medicine

## 2020-03-24 ENCOUNTER — Emergency Department (HOSPITAL_BASED_OUTPATIENT_CLINIC_OR_DEPARTMENT_OTHER): Payer: Medicare Other

## 2020-03-24 DIAGNOSIS — S79912A Unspecified injury of left hip, initial encounter: Secondary | ICD-10-CM | POA: Diagnosis not present

## 2020-03-24 DIAGNOSIS — E119 Type 2 diabetes mellitus without complications: Secondary | ICD-10-CM | POA: Insufficient documentation

## 2020-03-24 DIAGNOSIS — Z79899 Other long term (current) drug therapy: Secondary | ICD-10-CM | POA: Diagnosis not present

## 2020-03-24 DIAGNOSIS — S79911A Unspecified injury of right hip, initial encounter: Secondary | ICD-10-CM | POA: Diagnosis not present

## 2020-03-24 DIAGNOSIS — I6782 Cerebral ischemia: Secondary | ICD-10-CM | POA: Diagnosis not present

## 2020-03-24 DIAGNOSIS — S0990XA Unspecified injury of head, initial encounter: Secondary | ICD-10-CM | POA: Diagnosis not present

## 2020-03-24 DIAGNOSIS — E039 Hypothyroidism, unspecified: Secondary | ICD-10-CM | POA: Diagnosis not present

## 2020-03-24 DIAGNOSIS — M160 Bilateral primary osteoarthritis of hip: Secondary | ICD-10-CM | POA: Insufficient documentation

## 2020-03-24 DIAGNOSIS — M25551 Pain in right hip: Secondary | ICD-10-CM | POA: Diagnosis not present

## 2020-03-24 DIAGNOSIS — Z043 Encounter for examination and observation following other accident: Secondary | ICD-10-CM | POA: Diagnosis not present

## 2020-03-24 DIAGNOSIS — Z7901 Long term (current) use of anticoagulants: Secondary | ICD-10-CM | POA: Diagnosis not present

## 2020-03-24 DIAGNOSIS — Z79890 Hormone replacement therapy (postmenopausal): Secondary | ICD-10-CM | POA: Diagnosis not present

## 2020-03-24 DIAGNOSIS — Y92009 Unspecified place in unspecified non-institutional (private) residence as the place of occurrence of the external cause: Secondary | ICD-10-CM | POA: Insufficient documentation

## 2020-03-24 DIAGNOSIS — W19XXXA Unspecified fall, initial encounter: Secondary | ICD-10-CM | POA: Diagnosis not present

## 2020-03-24 DIAGNOSIS — M25552 Pain in left hip: Secondary | ICD-10-CM | POA: Diagnosis not present

## 2020-03-24 DIAGNOSIS — R42 Dizziness and giddiness: Secondary | ICD-10-CM | POA: Insufficient documentation

## 2020-03-24 DIAGNOSIS — M549 Dorsalgia, unspecified: Secondary | ICD-10-CM | POA: Diagnosis present

## 2020-03-24 DIAGNOSIS — M545 Low back pain: Secondary | ICD-10-CM | POA: Diagnosis not present

## 2020-03-24 NOTE — ED Provider Notes (Signed)
[facility] EMERGENCY DEPARTMENT Provider Note   CSN: [id number] Arrival date & time: 03/24/20  0042     History Chief Complaint  Patient presents with  . Fall    ERROLL WILBOURNE is a 59 y.o. male.  The history is provided by the patient.  Fall This is a new problem. The current episode started yesterday. The problem occurs rarely. The problem has been resolved. Pertinent negatives include no chest pain, no abdominal pain, no headaches and no shortness of breath. Nothing aggravates the symptoms. Nothing relieves the symptoms. He has tried nothing for the symptoms. The treatment provided significant relief.  [address] and it was hard to get up and wanted to come in for a check.       Past Medical History:  Diagnosis Date  . A-fib ([place])   . Arrhythmia   . Chronic a-fib ([place])   . Closed fracture of left distal radius   . Depression   . Depression   . Hypothyroidism   . Low back pain   . Morbid obesity, BMI unknown ([place])   . Osteoarthritis    oa in bilateral knees  . Schizophrenia ([place])   . Seizures ([place])   . Thyroid disease   . Varicose veins     Patient Active Problem List   Diagnosis Date Noted  . Right hip pain 01/26/2020  . Abscess 01/26/2020  . Spinal stenosis of lumbar region 01/16/2020  . Chronic lumbar radiculopathy 12/08/2019  . Hyperlipidemia associated with type 2 diabetes mellitus ([place]) 12/08/2019  . Dizziness 11/03/2019  . Paronychia of left index finger 05/19/2019  . Fall at home 11/24/2018  . Rosacea 10/24/2018  . Loss of weight 09/09/2018  . Intertrigo 04/08/2018  . Schizoaffective disorder, depressive type ([place]) 04/08/2018  . Cough 10/20/2017  . Left foot pain 04/16/2017  . Contact dermatitis 04/16/2017  . Varicose veins of bilateral lower extremities with other complications 78/24/2353  . Erectile dysfunction 04/26/2015  . Hypothyroidism   . Seizures ([place])   . Depression   . Persistent atrial fibrillation ([place])   . Obesity, Class  III, BMI 40-49.9 (morbid obesity) ([place])   . Seborrheic dermatitis 02/10/2007  . T2DM (type 2 diabetes mellitus) ([place]) 02/10/2007    Past Surgical History:  Procedure Laterality Date  . ACHILLES TENDON REPAIR  approx. 2004   right foot  . CATARACT EXTRACTION     bilateral  . OPEN REDUCTION INTERNAL FIXATION (ORIF) DISTAL RADIAL FRACTURE Left 07/21/2018   Procedure: OPEN REDUCTION INTERNAL FIXATION (ORIF) DISTAL RADIAL FRACTURE;  Surgeon: [name], MD;  Location: [facility];  Service: Orthopedics;  Laterality: Left;  . stab phlebectomy  Right 11/09/2016   stab phlebectomy R leg by Tinnie Gens MD  . TOOTH EXTRACTION         Family History  Problem Relation Age of Onset  . Diabetes Father   . Heart disease Father   . Diabetes Brother   . OCD Mother     Social History   Tobacco Use  . Smoking status: Never Smoker  . Smokeless tobacco: Never Used  Vaping Use  . Vaping Use: Never used  Substance Use Topics  . Alcohol use: No    Alcohol/week: 0.0 standard drinks  . Drug use: No    Home Medications Prior to Admission medications   Medication Sig Start Date End Date Taking? Authorizing Provider  allopurinol (ZYLOPRIM) 300 MG tablet TAKE 1 TABLET BY MOUTH EVERY DAY 01/29/20   Matilde Haymaker,  MD  apixaban (ELIQUIS) 5 MG TABS tablet Take 1 tablet (5 mg total) by mouth 2 (two) times daily. 06/16/19   Matilde Haymaker, MD  atorvastatin (LIPITOR) 40 MG tablet Take 1 tablet (40 mg total) by mouth daily. 11/03/19   Kathrene Alu, MD  benztropine (COGENTIN) 1 MG tablet Take 1 tablet (1 mg total) by mouth daily. 03/13/20   Arfeen, Arlyce Harman, MD  DEPAKOTE ER 250 MG 24 hr tablet Take 2 tablets (500 mg total) by mouth 2 (two) times daily. 03/13/20   Arfeen, Arlyce Harman, MD  diclofenac Sodium (VOLTAREN) 1 % GEL Apply 4 g topically 4 (four) times daily. 01/25/20   Wilber Oliphant, MD  levothyroxine (SYNTHROID) 100 MCG tablet Take 1 tablet (100 mcg total) by mouth daily. 09/11/19   Matilde Haymaker, MD  perphenazine (TRILAFON) 8 MG tablet Take 3 tablets (24 mg total) by mouth at bedtime. 03/13/20   Arfeen, Arlyce Harman, MD  phenelzine (NARDIL) 15 MG tablet Take 3 tablets (45 mg total) by mouth 2 (two) times daily. 03/13/20   Arfeen, Arlyce Harman, MD  tacrolimus (PROTOPIC) 0.1 % ointment APPLY TO AFFECTED AREA EVERY DAY AT NIGHT 12/27/18   [provider]    Allergies    Lamictal [lamotrigine] and Metformin and related  Review of Systems   Review of Systems  Constitutional: Negative for fever.  HENT: Negative for congestion.   Eyes: Negative for visual disturbance.  Respiratory: Negative for shortness of breath.   Cardiovascular: Negative for chest pain.  Gastrointestinal: Negative for abdominal pain.  Genitourinary: Negative for difficulty urinating.  Musculoskeletal: Positive for arthralgias.  Neurological: Negative for seizures, weakness, numbness and headaches.  Psychiatric/Behavioral: Negative for agitation.  All other systems reviewed and are negative.   Physical Exam Updated Vital Signs BP (!) 134/98   Pulse (!) 103   Temp 98.4 F (36.9 C)   Resp 20   Wt 119.3 kg   SpO2 96%   BMI 37.19 kg/m   Physical Exam Vitals and nursing note reviewed.  Constitutional:      General: He is not in acute distress.    Appearance: Normal appearance.  HENT:     Head: Normocephalic and atraumatic.     Nose: Nose normal.  Eyes:     Conjunctiva/sclera: Conjunctivae normal.     Pupils: Pupils are equal, round, and reactive to light.  Cardiovascular:     Rate and Rhythm: Normal rate and regular rhythm.     Pulses: Normal pulses.     Heart sounds: Normal heart sounds.  Pulmonary:     Effort: Pulmonary effort is normal.     Breath sounds: Normal breath sounds.  Abdominal:     General: Abdomen is flat. Bowel sounds are normal.     Palpations: Abdomen is soft.     Tenderness: There is no abdominal tenderness. There is no guarding.  Musculoskeletal:        General: Normal  range of motion.     Cervical back: Normal range of motion and neck supple.  Skin:    General: Skin is warm and dry.     Capillary Refill: Capillary refill takes less than 2 seconds.  Neurological:     General: No focal deficit present.     Mental Status: He is alert and oriented to person, place, and time.     Deep Tendon Reflexes: Reflexes normal.  Psychiatric:        Mood and Affect: Mood normal.  Behavior: Behavior normal.     ED Results / Procedures / Treatments   Labs (all labs ordered are listed, but only abnormal results are displayed) Labs Reviewed - No data to display  EKG None  Radiology DG Lumbar Spine Complete  Result Date: 03/24/2020 CLINICAL DATA:  Tripped and fell, low back pain EXAM: LUMBAR SPINE - COMPLETE 4+ VIEW COMPARISON:  12/19/2019 FINDINGS: Frontal, bilateral oblique, lateral views of the lumbar spine are obtained. No fracture. Findings compatible with DISH, with extensive flowing anterior osteophytes and preservation of disc spaces. There is extensive facet hypertrophy throughout the visualized lumbar spine. Sacroiliac joints are normal. IMPRESSION: 1. No acute bony abnormality. 2. Prominent multilevel osteophytes and facet hypertrophy as above, appearance compatible with DISH. Electronically Signed   By: Randa Ngo M.D.   On: 03/24/2020 02:48   CT Head Wo Contrast  Result Date: 03/24/2020 CLINICAL DATA:  [place] yesterday EXAM: CT HEAD WITHOUT CONTRAST TECHNIQUE: Contiguous axial images were obtained from the base of the skull through the vertex without intravenous contrast. COMPARISON:  04/28/2019 FINDINGS: Brain: No acute infarct or hemorrhage. Chronic small vessel ischemic changes are seen within the periventricular white matter and bilateral basal ganglia. Lateral ventricles and remaining midline structures are unremarkable. No acute extra-axial fluid collections. No mass effect. Vascular: No hyperdense vessel or unexpected calcification. Skull:  Normal. Negative for fracture or focal lesion. Sinuses/Orbits: No acute finding. Other: None. IMPRESSION: 1. Stable chronic small vessel ischemic changes. No acute intracranial process. Electronically Signed   By: Randa Ngo M.D.   On: 03/24/2020 02:50   DG Hips Bilat W or Wo Pelvis 5 Views  Result Date: 03/24/2020 CLINICAL DATA:  Tripped and fell yesterday, bilateral left greater than right hip pain EXAM: DG HIP (WITH OR WITHOUT PELVIS) 5+V BILAT COMPARISON:  09/02/2019 FINDINGS: Frontal view of the pelvis as well as frontal and frogleg lateral views of both hips are obtained. There are no acute displaced fractures. Moderate bilateral hip osteoarthritis is again noted with joint space narrowing and marked osteophyte formation. Sacroiliac joints are normal. Soft tissues are unremarkable. IMPRESSION: 1. Stable moderate bilateral hip osteoarthritis.  No acute fracture. Electronically Signed   By: Randa Ngo M.D.   On: 03/24/2020 02:49    Procedures Procedures (including critical care time)  Medications Ordered in ED Medications - No data to display  ED Course  I have reviewed the triage vital signs and the nursing notes.  Pertinent labs & imaging results that were available during my care of the patient were reviewed by me and considered in my medical decision making (see chart for details).    No injuries from the fall, has OA in B hips.  Continue PT as you have been.  May take tylenol for pain and ice areas of concern.    KASHAWN DIRR was evaluated in Emergency Department on 03/24/2020 for the symptoms described in the history of present illness. He was evaluated in the context of the global COVID-19 pandemic, which necessitated consideration that the patient might be at risk for infection with the SARS-CoV-2 virus that causes COVID-19. Institutional protocols and algorithms that pertain to the evaluation of patients at risk for COVID-19 are in a state of rapid change based on  information released by regulatory bodies including the CDC and federal and state organizations. These policies and algorithms were followed during the patient's care in the ED.  Final Clinical Impression(s) / ED Diagnoses Return for intractable cough, coughing up blood,fevers >100.4 unrelieved by  medication, shortness of breath, intractable vomiting, chest pain, shortness of breath, weakness,numbness, changes in speech, facial asymmetry,abdominal pain, passing out,Inability to tolerate liquids or food, cough, altered mental status or any concerns. No signs of systemic illness or infection. The patient is nontoxic-appearing on exam and vital signs are within normal limits.   I have reviewed the triage vital signs and the nursing notes. Pertinent labs &imaging results that were available during my care of the patient were reviewed by me and considered in my medical decision making (see chart for details).After history, exam, and medical workup I feel the patient has beenappropriately medically screened and is safe for discharge home. Pertinent diagnoses were discussed with the patient. Patient was given return precautions.    Matthias Bogus, MD 03/24/20 2836

## 2020-03-24 NOTE — ED Triage Notes (Signed)
Fall yesterday at home. States back pain/left buttock, reports some trouble getting up from fall. Ambulatory.

## 2020-03-28 ENCOUNTER — Ambulatory Visit: Payer: Medicare Other

## 2020-03-28 ENCOUNTER — Other Ambulatory Visit: Payer: Self-pay

## 2020-03-28 DIAGNOSIS — M6281 Muscle weakness (generalized): Secondary | ICD-10-CM

## 2020-03-28 DIAGNOSIS — M5441 Lumbago with sciatica, right side: Secondary | ICD-10-CM

## 2020-03-28 DIAGNOSIS — G8929 Other chronic pain: Secondary | ICD-10-CM

## 2020-03-28 NOTE — Therapy (Signed)
[facility] [address] [place], [place], [address] Phone: [phone]   Fax:  [phone]  Physical Therapy Treatment  Patient Details  Name: [name] MRN: [id number] Date of Birth: September 26, 1960 Referring Provider (PT): Rodell Perna, MD   Encounter Date: 03/28/2020   PT End of Session - 03/28/20 1309    Visit Number 2    Date for PT Re-Evaluation 05/14/20    Authorization Type Medicare    Progress Note Due on Visit 10    PT Start Time 1231    PT Stop Time 1321    PT Time Calculation (min) 50 min    Activity Tolerance Patient tolerated treatment well    Behavior During Therapy [facility] for tasks assessed/performed           Past Medical History:  Diagnosis Date  . A-fib ([place])   . Arrhythmia   . Chronic a-fib ([place])   . Closed fracture of left distal radius   . Depression   . Depression   . Hypothyroidism   . Low back pain   . Morbid obesity, BMI unknown ([place])   . Osteoarthritis    oa in bilateral knees  . Schizophrenia ([place])   . Seizures ([place])   . Thyroid disease   . Varicose veins     Past Surgical History:  Procedure Laterality Date  . ACHILLES TENDON REPAIR  approx. 2004   right foot  . CATARACT EXTRACTION     bilateral  . OPEN REDUCTION INTERNAL FIXATION (ORIF) DISTAL RADIAL FRACTURE Left 07/21/2018   Procedure: OPEN REDUCTION INTERNAL FIXATION (ORIF) DISTAL RADIAL FRACTURE;  Surgeon: [name], MD;  Location: [place];  Service: Orthopedics;  Laterality: Left;  . stab phlebectomy  Right 11/09/2016   stab phlebectomy R leg by Tinnie Gens MD  . TOOTH EXTRACTION      There were no vitals filed for this visit.   Subjective Assessment - 03/28/20 1229    Subjective I had a fall 03/24/20 when I was getting out of bed. I am really hurting in my low back.    Patient Stated Goals improve ability to sit, stand and walk with less pain    Pain Score 6     Pain Location Back     Pain Orientation Right;Left;Lower    Pain Descriptors / Indicators Aching;Sore    Pain Type Acute pain    Pain Onset More than a month ago    Pain Frequency Constant    Aggravating Factors  rolling in bed, movement    Pain Relieving Factors ice, heat                             [facility] Adult PT Treatment/Exercise - 03/28/20 0001      Bed Mobility   Bed Mobility Rolling Right;Rolling Left      Exercises   Exercises Knee/Hip;Lumbar      Lumbar Exercises: Stretches   Active Hamstring Stretch Left;Right;2 reps;20 seconds    Active Hamstring Stretch Limitations not able to achieve full knee extension    Lower Trunk Rotation 3 reps;20 seconds    Lower Trunk Rotation Limitations tactile and verbal cues required by PT      Lumbar Exercises: Aerobic   Nustep Level 1x 8 minutes    PT present to discuss recent fall     Modalities   Modalities Electrical Stimulation;Moist Heat  Moist Heat Therapy   Number Minutes Moist Heat 15 Minutes    Moist Heat Location Lumbar Spine      Electrical Stimulation   Electrical Stimulation Location bil low back    Electrical Stimulation Action IFC    Electrical Stimulation Parameters 15 minutes     Electrical Stimulation Goals Pain                  PT Education - 03/28/20 1311    Education Details log roll technique, home TENS info    Person(s) Educated Patient    Methods Explanation;Handout    Comprehension Verbalized understanding            PT Short Term Goals - 03/19/20 1330      PT SHORT TERM GOAL #1   Title be independent in initial HEP    Time 4    Period Weeks    Status New    Target Date 04/16/20      PT SHORT TERM GOAL #2   Title improve strength to perform 5x sit to stand in < or = to 17 seconds to improve safety    Time 4    Period Weeks    Status New    Target Date 04/16/20      PT SHORT TERM GOAL #3   Title improve strength to stand and walk for > or =to 20 minutes without limitation      Time 4    Period Weeks    Status New    Target Date 04/16/20             PT Long Term Goals - 03/19/20 1253      PT LONG TERM GOAL #1   Title be independent in advanced HEP    Time 8    Period Weeks    Status New    Target Date 05/14/20      PT LONG TERM GOAL #2   Title reduce FOTO to < or = to 41% limitation    Time 8    Period Weeks    Status New    Target Date 05/14/20      PT LONG TERM GOAL #3   Title improve LE strength and endurance to walk for 30 minutes to improve endurance in the community    Time 8    Period Weeks    Status New    Target Date 05/14/20      PT LONG TERM GOAL #4   Title improve 5x sit to stand to < or = to 15 seconds to reduce falls risk    Time 8    Period Weeks    Status New    Target Date 05/14/20      PT LONG TERM GOAL #5   Title improve LE strength to perform sit to stand transition without UE support and demonstrate control with descent    Time 8    Period Weeks    Status New    Target Date 05/14/20                 Plan - 03/28/20 1308    Clinical Impression Statement Pt with first time follow-up after evaluation.  Pt had a fall getting out of bed on 03/24/20 and reports increased pain in the low back since this time.  Pt has not been exercising due of not sure what to do.  PT spent session reviewing HEP and gentle flexibility exercises.  PT issued information  regarding home TENs unit and tried electrical stimulation in the clinic to determine if this helped the pain.  PT instructed on log roll with abdominal bracing for bed mobility at home.  Pt required max verbal and tactile cueing for this. Pt will continue to benefit from skilled PT to address chronic LBP and Rt LE weakness.    PT Frequency 2x / week    PT Duration 8 weeks    PT Treatment/Interventions ADLs/Self Care Home Management;Cryotherapy;Electrical Stimulation;Moist Heat;Gait training;Stair training;Functional mobility training;Therapeutic activities;Therapeutic  exercise;Balance training;Neuromuscular re-education;Manual techniques;Patient/family education;Dry needling;Spinal Manipulations;Joint Manipulations;Taping    PT Next Visit Plan review log roll, e-stim for pain, gentle mobility as LBP allows    PT Home Exercise Plan Access Code: [email or web address]    Consulted and Agree with Plan of Care Patient           Patient will benefit from skilled therapeutic intervention in order to improve the following deficits and impairments:  Abnormal gait, Decreased activity tolerance, Postural dysfunction, Decreased strength, Improper body mechanics, Impaired flexibility, Decreased balance, Pain, Increased muscle spasms, Decreased endurance, Decreased range of motion  Visit Diagnosis: Chronic bilateral low back pain with right-sided sciatica  Muscle weakness (generalized)     Problem List Patient Active Problem List   Diagnosis Date Noted  . Right hip pain 01/26/2020  . Abscess 01/26/2020  . Spinal stenosis of lumbar region 01/16/2020  . Chronic lumbar radiculopathy 12/08/2019  . Hyperlipidemia associated with type 2 diabetes mellitus ([place]) 12/08/2019  . Dizziness 11/03/2019  . Paronychia of left index finger 05/19/2019  . Fall at home 11/24/2018  . Rosacea 10/24/2018  . Loss of weight 09/09/2018  . Intertrigo 04/08/2018  . Schizoaffective disorder, depressive type ([place]) 04/08/2018  . Cough 10/20/2017  . Left foot pain 04/16/2017  . Contact dermatitis 04/16/2017  . Varicose veins of bilateral lower extremities with other complications 99/37/1696  . Erectile dysfunction 04/26/2015  . Hypothyroidism   . Seizures ([place])   . Depression   . Persistent atrial fibrillation ([place])   . Obesity, Class III, BMI 40-49.9 (morbid obesity) ([place])   . Seborrheic dermatitis 02/10/2007  . T2DM (type 2 diabetes mellitus) ([place]) 02/10/2007     Sigurd Sos, PT 03/28/20 1:11 PM  [facility] [address],  [place], [place], [address] Phone: [phone]   Fax:  [phone]  Name: [name] MRN: [id number] Date of Birth: 08-03-1960

## 2020-04-03 ENCOUNTER — Other Ambulatory Visit: Payer: Self-pay

## 2020-04-03 ENCOUNTER — Ambulatory Visit: Payer: Medicare Other

## 2020-04-03 DIAGNOSIS — M5441 Lumbago with sciatica, right side: Secondary | ICD-10-CM

## 2020-04-03 DIAGNOSIS — G8929 Other chronic pain: Secondary | ICD-10-CM | POA: Diagnosis not present

## 2020-04-03 DIAGNOSIS — M6281 Muscle weakness (generalized): Secondary | ICD-10-CM | POA: Diagnosis not present

## 2020-04-03 NOTE — Therapy (Signed)
[facility] [address] [place], [place], [address] Phone: [phone]   Fax:  [phone]  Physical Therapy Treatment  Patient Details  Name: [name] MRN: [id number] Date of Birth: 02-Apr-1961 Referring Provider (PT): Rodell Perna, MD   Encounter Date: 04/03/2020   PT End of Session - 04/03/20 1527    Visit Number 3    Date for PT Re-Evaluation 05/14/20    Authorization Type Medicare    Progress Note Due on Visit 10    PT Start Time 1446    PT Stop Time 1540    PT Time Calculation (min) 54 min    Activity Tolerance Patient tolerated treatment well    Behavior During Therapy [facility] for tasks assessed/performed           Past Medical History:  Diagnosis Date  . A-fib ([place])   . Arrhythmia   . Chronic a-fib ([place])   . Closed fracture of left distal radius   . Depression   . Depression   . Hypothyroidism   . Low back pain   . Morbid obesity, BMI unknown ([place])   . Osteoarthritis    oa in bilateral knees  . Schizophrenia ([place])   . Seizures ([place])   . Thyroid disease   . Varicose veins     Past Surgical History:  Procedure Laterality Date  . ACHILLES TENDON REPAIR  approx. 2004   right foot  . CATARACT EXTRACTION     bilateral  . OPEN REDUCTION INTERNAL FIXATION (ORIF) DISTAL RADIAL FRACTURE Left 07/21/2018   Procedure: OPEN REDUCTION INTERNAL FIXATION (ORIF) DISTAL RADIAL FRACTURE;  Surgeon: [name], MD;  Location: [place];  Service: Orthopedics;  Laterality: Left;  . stab phlebectomy  Right 11/09/2016   stab phlebectomy R leg by Tinnie Gens MD  . TOOTH EXTRACTION      There were no vitals filed for this visit.   Subjective Assessment - 04/03/20 1449    Subjective I looked into the TENS unit and I am still looking for one with good reviews.  I think it helped.    Patient Stated Goals improve ability to sit, stand and walk with less pain    Currently in Pain? Yes    Pain  Score 2     Pain Location Back    Pain Orientation Right;Left;Lower    Pain Descriptors / Indicators Aching;Sore    Pain Type Acute pain    Pain Onset More than a month ago    Pain Frequency Constant    Aggravating Factors  too much activity, movement    Pain Relieving Factors ice, heat                             [facility] Adult PT Treatment/Exercise - 04/03/20 0001      Lumbar Exercises: Aerobic   Nustep Level 1x 8 minutes    PT present to discuss recent fall     Lumbar Exercises: Standing   Row Strengthening;Both;20 reps;Theraband    Theraband Level (Row) Level 1 (Yellow)    Row Limitations abdominal bracing- performed in sitting      Knee/Hip Exercises: Standing   Rocker Board 3 minutes      Knee/Hip Exercises: Seated   Long Arc Quad Strengthening;Both;2 sets;10 reps    [employer] 5" hold x 15       Modalities   Modalities Electrical Stimulation;Moist Heat  Moist Heat Therapy   Number Minutes Moist Heat 15 Minutes    Moist Heat Location Lumbar Spine      Electrical Stimulation   Electrical Stimulation Location bil low back    Electrical Stimulation Action IFC    Electrical Stimulation Parameters 15 minutes    Electrical Stimulation Goals Pain                    PT Short Term Goals - 03/19/20 1330      PT SHORT TERM GOAL #1   Title be independent in initial HEP    Time 4    Period Weeks    Status New    Target Date 04/16/20      PT SHORT TERM GOAL #2   Title improve strength to perform 5x sit to stand in < or = to 17 seconds to improve safety    Time 4    Period Weeks    Status New    Target Date 04/16/20      PT SHORT TERM GOAL #3   Title improve strength to stand and walk for > or =to 20 minutes without limitation    Time 4    Period Weeks    Status New    Target Date 04/16/20             PT Long Term Goals - 03/19/20 1253      PT LONG TERM GOAL #1   Title be independent in advanced HEP    Time 8    Period  Weeks    Status New    Target Date 05/14/20      PT LONG TERM GOAL #2   Title reduce FOTO to < or = to 41% limitation    Time 8    Period Weeks    Status New    Target Date 05/14/20      PT LONG TERM GOAL #3   Title improve LE strength and endurance to walk for 30 minutes to improve endurance in the community    Time 8    Period Weeks    Status New    Target Date 05/14/20      PT LONG TERM GOAL #4   Title improve 5x sit to stand to < or = to 15 seconds to reduce falls risk    Time 8    Period Weeks    Status New    Target Date 05/14/20      PT LONG TERM GOAL #5   Title improve LE strength to perform sit to stand transition without UE support and demonstrate control with descent    Time 8    Period Weeks    Status New    Target Date 05/14/20                 Plan - 04/03/20 1509    Clinical Impression Statement Pt arrives with less pain today overall.  Pt was able to participate in low level exercise with improved overall mobility and tolerance for activity.  Pt with significant tightness in posterior chain and requires substitution with long arc quads and hamstring stretch, not able to achieve full knee extension with this.  Pt requires verbal and tactile cues to relax shoulders during exercise today.  Pt will continue to benefit from skilled PT to address LBP, balance and LE weakness.    Rehab Potential Good    PT Frequency 2x / week    PT Duration  8 weeks    PT Treatment/Interventions ADLs/Self Care Home Management;Cryotherapy;Electrical Stimulation;Moist Heat;Gait training;Stair training;Functional mobility training;Therapeutic activities;Therapeutic exercise;Balance training;Neuromuscular re-education;Manual techniques;Patient/family education;Dry needling;Spinal Manipulations;Joint Manipulations;Taping    PT Next Visit Plan e-stim for pain, gentle exercise for strength, endurance and flexibility as pt tolerates    PT Home Exercise Plan Access Code: [email or web address]     Recommended Other Services initial cert is signed    Consulted and Agree with Plan of Care Patient           Patient will benefit from skilled therapeutic intervention in order to improve the following deficits and impairments:  Abnormal gait, Decreased activity tolerance, Postural dysfunction, Decreased strength, Improper body mechanics, Impaired flexibility, Decreased balance, Pain, Increased muscle spasms, Decreased endurance, Decreased range of motion  Visit Diagnosis: Chronic bilateral low back pain with right-sided sciatica  Muscle weakness (generalized)     Problem List Patient Active Problem List   Diagnosis Date Noted  . Right hip pain 01/26/2020  . Abscess 01/26/2020  . Spinal stenosis of lumbar region 01/16/2020  . Chronic lumbar radiculopathy 12/08/2019  . Hyperlipidemia associated with type 2 diabetes mellitus ([place]) 12/08/2019  . Dizziness 11/03/2019  . Paronychia of left index finger 05/19/2019  . Fall at home 11/24/2018  . Rosacea 10/24/2018  . Loss of weight 09/09/2018  . Intertrigo 04/08/2018  . Schizoaffective disorder, depressive type ([place]) 04/08/2018  . Cough 10/20/2017  . Left foot pain 04/16/2017  . Contact dermatitis 04/16/2017  . Varicose veins of bilateral lower extremities with other complications 81/18/8677  . Erectile dysfunction 04/26/2015  . Hypothyroidism   . Seizures ([place])   . Depression   . Persistent atrial fibrillation ([place])   . Obesity, Class III, BMI 40-49.9 (morbid obesity) ([place])   . Seborrheic dermatitis 02/10/2007  . T2DM (type 2 diabetes mellitus) ([place]) 02/10/2007     Sigurd Sos, PT 04/03/20 3:28 PM  [facility] [address], [place], [place], [address] Phone: [phone]   Fax:  [phone]  Name: [name] MRN: [id number] Date of Birth: 14-Jun-1961

## 2020-04-08 ENCOUNTER — Ambulatory Visit: Payer: Medicare Other | Attending: Family Medicine | Admitting: Physical Therapy

## 2020-04-08 ENCOUNTER — Encounter: Payer: Self-pay | Admitting: Physical Therapy

## 2020-04-08 ENCOUNTER — Other Ambulatory Visit: Payer: Self-pay

## 2020-04-08 DIAGNOSIS — G8929 Other chronic pain: Secondary | ICD-10-CM | POA: Insufficient documentation

## 2020-04-08 DIAGNOSIS — M6281 Muscle weakness (generalized): Secondary | ICD-10-CM | POA: Diagnosis not present

## 2020-04-08 DIAGNOSIS — M5441 Lumbago with sciatica, right side: Secondary | ICD-10-CM | POA: Diagnosis not present

## 2020-04-08 NOTE — Therapy (Signed)
[facility] [address] [name], [place], [address] Phone: [phone]   Fax:  [phone]  Physical Therapy Treatment  Patient Details  Name: [name] MRN: [id number] Date of Birth: May 14, 1961 Referring Provider (PT): Rodell Perna, MD   Encounter Date: 04/08/2020   PT End of Session - 04/08/20 1445    Visit Number 4    Authorization Type Medicare    Progress Note Due on Visit 10    PT Start Time 9798    PT Stop Time 1530    PT Time Calculation (min) 45 min    Activity Tolerance Patient tolerated treatment well    Behavior During Therapy [facility] for tasks assessed/performed           Past Medical History:  Diagnosis Date  . A-fib ([place])   . Arrhythmia   . Chronic a-fib ([place])   . Closed fracture of left distal radius   . Depression   . Depression   . Hypothyroidism   . Low back pain   . Morbid obesity, BMI unknown ([place])   . Osteoarthritis    oa in bilateral knees  . Schizophrenia ([place])   . Seizures ([place])   . Thyroid disease   . Varicose veins     Past Surgical History:  Procedure Laterality Date  . ACHILLES TENDON REPAIR  approx. 2004   right foot  . CATARACT EXTRACTION     bilateral  . OPEN REDUCTION INTERNAL FIXATION (ORIF) DISTAL RADIAL FRACTURE Left 07/21/2018   Procedure: OPEN REDUCTION INTERNAL FIXATION (ORIF) DISTAL RADIAL FRACTURE;  Surgeon: [name], MD;  Location: [place];  Service: Orthopedics;  Laterality: Left;  . stab phlebectomy  Right 11/09/2016   stab phlebectomy R leg by Tinnie Gens MD  . TOOTH EXTRACTION      There were no vitals filed for this visit.   Subjective Assessment - 04/08/20 1448    Subjective Not a good day: pain, leg numbness, stiffness.    Pertinent History drepression, spinal stenosis, schizophrenia    Currently in Pain? Yes    Pain Score 6     Pain Location Back    Pain Orientation Right;Left;Lower    Pain Descriptors /  Indicators Sore;Numbness                             [facility] Adult PT Treatment/Exercise - 04/08/20 0001      Lumbar Exercises: Stretches   Active Hamstring Stretch Left;Right;2 reps;20 seconds    Gastroc Stretch --   at stairs: hip flexor/gastroc stretch 5x each side 10 sec ho     Lumbar Exercises: Aerobic   Nustep Level 1x 8 minutes    PTA present to discuss status     Moist Heat Therapy   Moist Heat Location Lumbar Spine      Electrical Stimulation   Electrical Stimulation Location bil low back    Electrical Stimulation Action IFC    Electrical Stimulation Parameters 15 min    Electrical Stimulation Goals Pain                    PT Short Term Goals - 03/19/20 1330      PT SHORT TERM GOAL #1   Title be independent in initial HEP    Time 4    Period Weeks    Status New    Target Date 04/16/20  PT SHORT TERM GOAL #2   Title improve strength to perform 5x sit to stand in < or = to 17 seconds to improve safety    Time 4    Period Weeks    Status New    Target Date 04/16/20      PT SHORT TERM GOAL #3   Title improve strength to stand and walk for > or =to 20 minutes without limitation    Time 4    Period Weeks    Status New    Target Date 04/16/20             PT Long Term Goals - 03/19/20 1253      PT LONG TERM GOAL #1   Title be independent in advanced HEP    Time 8    Period Weeks    Status New    Target Date 05/14/20      PT LONG TERM GOAL #2   Title reduce FOTO to < or = to 41% limitation    Time 8    Period Weeks    Status New    Target Date 05/14/20      PT LONG TERM GOAL #3   Title improve LE strength and endurance to walk for 30 minutes to improve endurance in the community    Time 8    Period Weeks    Status New    Target Date 05/14/20      PT LONG TERM GOAL #4   Title improve 5x sit to stand to < or = to 15 seconds to reduce falls risk    Time 8    Period Weeks    Status New    Target Date 05/14/20       PT LONG TERM GOAL #5   Title improve LE strength to perform sit to stand transition without UE support and demonstrate control with descent    Time 8    Period Weeks    Status New    Target Date 05/14/20                 Plan - 04/08/20 1451    Clinical Impression Statement Pt arrives with reports of 'not having a good day." Pt reportsback pain, stiffness, and intermittent numbness in his legs.pt with antalgic gait, very little movement in his hips.it appears pt is getting better knee extension with his hamstring stretches. pt main request was for electric stimulation to his low back as this gives him some lasting relief from his pain.    Personal Factors and Comorbidities Comorbidity 2    Comorbidities spinal stenosis, depression, lives alone, schizophrenic disorder    Examination-Activity Limitations Stand;Squat;Sit;Locomotion Level;Transfers    Examination-Participation Restrictions Community Activity;Driving;Yard Work;Shop    Stability/Clinical Decision Making Evolving/Moderate complexity    Rehab Potential Good    PT Frequency 2x / week    PT Duration 8 weeks    PT Treatment/Interventions ADLs/Self Care Home Management;Cryotherapy;Electrical Stimulation;Moist Heat;Gait training;Stair training;Functional mobility training;Therapeutic activities;Therapeutic exercise;Balance training;Neuromuscular re-education;Manual techniques;Patient/family education;Dry needling;Spinal Manipulations;Joint Manipulations;Taping    PT Next Visit Plan e-stim for pain, gentle exercise for strength, endurance and flexibility as pt tolerates    PT Home Exercise Plan Access Code: [email or web address]    Consulted and Agree with Plan of Care Patient           Patient will benefit from skilled therapeutic intervention in order to improve the following deficits and impairments:  Abnormal gait, Decreased activity tolerance, Postural dysfunction, Decreased strength,  Improper body mechanics, Impaired flexibility,  Decreased balance, Pain, Increased muscle spasms, Decreased endurance, Decreased range of motion  Visit Diagnosis: Chronic bilateral low back pain with right-sided sciatica  Muscle weakness (generalized)     Problem List Patient Active Problem List   Diagnosis Date Noted  . Right hip pain 01/26/2020  . Abscess 01/26/2020  . Spinal stenosis of lumbar region 01/16/2020  . Chronic lumbar radiculopathy 12/08/2019  . Hyperlipidemia associated with type 2 diabetes mellitus ([place]) 12/08/2019  . Dizziness 11/03/2019  . Paronychia of left index finger 05/19/2019  . Fall at home 11/24/2018  . Rosacea 10/24/2018  . Loss of weight 09/09/2018  . Intertrigo 04/08/2018  . Schizoaffective disorder, depressive type ([place]) 04/08/2018  . Cough 10/20/2017  . Left foot pain 04/16/2017  . Contact dermatitis 04/16/2017  . Varicose veins of bilateral lower extremities with other complications 48/07/6551  . Erectile dysfunction 04/26/2015  . Hypothyroidism   . Seizures ([place])   . Depression   . Persistent atrial fibrillation ([place])   . Obesity, Class III, BMI 40-49.9 (morbid obesity) ([place])   . Seborrheic dermatitis 02/10/2007  . T2DM (type 2 diabetes mellitus) ([place]) 02/10/2007    Madi Bonfiglio, PTA 04/08/2020, 3:19 PM  [facility] [address], [place], [place], [address] Phone: [phone]   Fax:  [phone]  Name: KARION CUDD MRN: [id number] Date of Birth: 03/01/1961

## 2020-04-23 ENCOUNTER — Encounter (HOSPITAL_BASED_OUTPATIENT_CLINIC_OR_DEPARTMENT_OTHER): Payer: Self-pay | Admitting: Emergency Medicine

## 2020-04-23 ENCOUNTER — Emergency Department (HOSPITAL_BASED_OUTPATIENT_CLINIC_OR_DEPARTMENT_OTHER)
Admission: EM | Admit: 2020-04-23 | Discharge: 2020-04-23 | Disposition: A | Discharge status: Home / Self Care | Payer: Medicare Other | Attending: Emergency Medicine | Admitting: Emergency Medicine

## 2020-04-23 ENCOUNTER — Other Ambulatory Visit: Payer: Self-pay

## 2020-04-23 ENCOUNTER — Emergency Department (HOSPITAL_BASED_OUTPATIENT_CLINIC_OR_DEPARTMENT_OTHER): Payer: Medicare Other

## 2020-04-23 DIAGNOSIS — E039 Hypothyroidism, unspecified: Secondary | ICD-10-CM | POA: Insufficient documentation

## 2020-04-23 DIAGNOSIS — M1712 Unilateral primary osteoarthritis, left knee: Secondary | ICD-10-CM | POA: Diagnosis not present

## 2020-04-23 DIAGNOSIS — M545 Low back pain, unspecified: Secondary | ICD-10-CM | POA: Insufficient documentation

## 2020-04-23 DIAGNOSIS — E119 Type 2 diabetes mellitus without complications: Secondary | ICD-10-CM | POA: Diagnosis not present

## 2020-04-23 DIAGNOSIS — Z043 Encounter for examination and observation following other accident: Secondary | ICD-10-CM | POA: Diagnosis not present

## 2020-04-23 DIAGNOSIS — W19XXXA Unspecified fall, initial encounter: Secondary | ICD-10-CM | POA: Insufficient documentation

## 2020-04-23 DIAGNOSIS — Z7901 Long term (current) use of anticoagulants: Secondary | ICD-10-CM | POA: Diagnosis not present

## 2020-04-23 DIAGNOSIS — M25562 Pain in left knee: Secondary | ICD-10-CM | POA: Insufficient documentation

## 2020-04-23 DIAGNOSIS — Z79899 Other long term (current) drug therapy: Secondary | ICD-10-CM | POA: Diagnosis not present

## 2020-04-23 MED ORDER — IBUPROFEN 800 MG PO TABS: 800.0000 mg | ORAL_TABLET | Freq: Once | ORAL | Status: DC

## 2020-04-23 MED ORDER — ACETAMINOPHEN 500 MG PO TABS
1000.0000 mg | ORAL_TABLET | Freq: Once | ORAL | Status: AC
  Administered 2020-04-23: 1000 mg via ORAL
  Filled 2020-04-23: qty 2

## 2020-04-23 MED ORDER — LIDOCAINE 5 % EX PTCH
2.0000 | MEDICATED_PATCH | CUTANEOUS | Status: DC
  Administered 2020-04-23: 2 via TRANSDERMAL
  Filled 2020-04-23: qty 2

## 2020-04-23 NOTE — ED Triage Notes (Signed)
Pt fell x 3 days ago due to uneven ground. Pt c/o lower back pain and left knee pain.

## 2020-04-23 NOTE — ED Provider Notes (Signed)
[facility] EMERGENCY DEPARTMENT Provider Note   CSN: [id number] Arrival date & time: 04/23/20  0406     History Chief Complaint  Patient presents with  . Fall    [name] is a 59 y.o. male.  The history is provided by the patient.  Fall This is a new problem. The current episode started more than 2 days ago (3 days ). The problem occurs rarely. The problem has been resolved. Pertinent negatives include no chest pain, no abdominal pain, no headaches and no shortness of breath. Nothing aggravates the symptoms. Nothing relieves the symptoms. He has tried nothing for the symptoms. The treatment provided no relief.  Wants his left knee and low back examined post fall on Saturday. No weakness nor numbness.       Past Medical History:  Diagnosis Date  . A-fib ([place])   . Arrhythmia   . Chronic a-fib ([place])   . Closed fracture of left distal radius   . Depression   . Depression   . Hypothyroidism   . Low back pain   . Morbid obesity, BMI unknown ([place])   . Osteoarthritis    oa in bilateral knees  . Schizophrenia ([place])   . Seizures ([place])   . Thyroid disease   . Varicose veins     Patient Active Problem List   Diagnosis Date Noted  . Right hip pain 01/26/2020  . Abscess 01/26/2020  . Spinal stenosis of lumbar region 01/16/2020  . Chronic lumbar radiculopathy 12/08/2019  . Hyperlipidemia associated with type 2 diabetes mellitus ([place]) 12/08/2019  . Dizziness 11/03/2019  . Paronychia of left index finger 05/19/2019  . Fall at home 11/24/2018  . Rosacea 10/24/2018  . Loss of weight 09/09/2018  . Intertrigo 04/08/2018  . Schizoaffective disorder, depressive type ([place]) 04/08/2018  . Cough 10/20/2017  . Left foot pain 04/16/2017  . Contact dermatitis 04/16/2017  . Varicose veins of bilateral lower extremities with other complications 40/97/3532  . Erectile dysfunction 04/26/2015  . Hypothyroidism   . Seizures ([place])   . Depression   . Persistent atrial  fibrillation ([place])   . Obesity, Class III, BMI 40-49.9 (morbid obesity) ([place])   . Seborrheic dermatitis 02/10/2007  . T2DM (type 2 diabetes mellitus) ([place]) 02/10/2007    Past Surgical History:  Procedure Laterality Date  . ACHILLES TENDON REPAIR  approx. 2004   right foot  . CATARACT EXTRACTION     bilateral  . OPEN REDUCTION INTERNAL FIXATION (ORIF) DISTAL RADIAL FRACTURE Left 07/21/2018   Procedure: OPEN REDUCTION INTERNAL FIXATION (ORIF) DISTAL RADIAL FRACTURE;  Surgeon: [name], MD;  Location: [facility];  Service: Orthopedics;  Laterality: Left;  . stab phlebectomy  Right 11/09/2016   stab phlebectomy R leg by Tinnie Gens MD  . TOOTH EXTRACTION         Family History  Problem Relation Age of Onset  . Diabetes Father   . Heart disease Father   . Diabetes Brother   . OCD Mother     Social History   Tobacco Use  . Smoking status: Never Smoker  . Smokeless tobacco: Never Used  Vaping Use  . Vaping Use: Never used  Substance Use Topics  . Alcohol use: No    Alcohol/week: 0.0 standard drinks  . Drug use: No    Home Medications Prior to Admission medications   Medication Sig Start Date End Date Taking? Authorizing Provider  allopurinol (ZYLOPRIM) 300 MG tablet TAKE 1 TABLET BY MOUTH  EVERY DAY 01/29/20   Matilde Haymaker, MD  apixaban (ELIQUIS) 5 MG TABS tablet Take 1 tablet (5 mg total) by mouth 2 (two) times daily. 06/16/19   Matilde Haymaker, MD  atorvastatin (LIPITOR) 40 MG tablet Take 1 tablet (40 mg total) by mouth daily. 11/03/19   Kathrene Alu, MD  benztropine (COGENTIN) 1 MG tablet Take 1 tablet (1 mg total) by mouth daily. 03/13/20   Arfeen, Arlyce Harman, MD  DEPAKOTE ER 250 MG 24 hr tablet Take 2 tablets (500 mg total) by mouth 2 (two) times daily. 03/13/20   Arfeen, Arlyce Harman, MD  diclofenac Sodium (VOLTAREN) 1 % GEL Apply 4 g topically 4 (four) times daily. 01/25/20   Wilber Oliphant, MD  levothyroxine (SYNTHROID) 100 MCG tablet Take 1 tablet (100 mcg  total) by mouth daily. 09/11/19   Matilde Haymaker, MD  perphenazine (TRILAFON) 8 MG tablet Take 3 tablets (24 mg total) by mouth at bedtime. 03/13/20   Arfeen, Arlyce Harman, MD  phenelzine (NARDIL) 15 MG tablet Take 3 tablets (45 mg total) by mouth 2 (two) times daily. 03/13/20   Arfeen, Arlyce Harman, MD  tacrolimus (PROTOPIC) 0.1 % ointment APPLY TO AFFECTED AREA EVERY DAY AT NIGHT 12/27/18   [provider]    Allergies    Lamictal [lamotrigine] and Metformin and related  Review of Systems   Review of Systems  Constitutional: Negative for fever.  HENT: Negative for congestion.   Eyes: Negative for visual disturbance.  Respiratory: Negative for shortness of breath.   Cardiovascular: Negative for chest pain.  Gastrointestinal: Negative for abdominal pain.  Genitourinary: Negative for difficulty urinating.  Musculoskeletal: Positive for arthralgias. Negative for gait problem.  Skin: Negative for rash.  Neurological: Negative for weakness, numbness and headaches.  Psychiatric/Behavioral: Negative for agitation.  All other systems reviewed and are negative.   Physical Exam Updated Vital Signs BP (!) 138/97   Pulse 95   Temp 97.9 F (36.6 C) (Oral)   Resp 18   Ht 5' 10.5" (1.791 m)   Wt 119.3 kg   SpO2 95%   BMI 37.20 kg/m   Physical Exam Vitals and nursing note reviewed.  Constitutional:      General: He is not in acute distress.    Appearance: Normal appearance.  HENT:     Head: Normocephalic and atraumatic.     Nose: Nose normal.  Eyes:     Conjunctiva/sclera: Conjunctivae normal.     Pupils: Pupils are equal, round, and reactive to light.  Cardiovascular:     Rate and Rhythm: Normal rate and regular rhythm.     Pulses: Normal pulses.     Heart sounds: Normal heart sounds.  Pulmonary:     Effort: Pulmonary effort is normal.     Breath sounds: Normal breath sounds.  Abdominal:     General: Abdomen is flat. Bowel sounds are normal.     Palpations: Abdomen is soft.      Tenderness: There is no abdominal tenderness. There is no guarding or rebound.  Musculoskeletal:        General: Normal range of motion.     Cervical back: Normal, normal range of motion and neck supple.     Thoracic back: Normal.     Lumbar back: Normal.     Right knee: Normal.     Left knee: Normal.  Skin:    General: Skin is warm and dry.     Capillary Refill: Capillary refill takes less than 2 seconds.  Neurological:     General: No focal deficit present.     Mental Status: He is alert and oriented to person, place, and time.     Deep Tendon Reflexes: Reflexes normal.  Psychiatric:        Mood and Affect: Mood normal.        Behavior: Behavior normal.     ED Results / Procedures / Treatments   Labs (all labs ordered are listed, but only abnormal results are displayed) Labs Reviewed - No data to display  EKG None  Radiology DG Lumbar Spine Complete  Result Date: 04/23/2020 CLINICAL DATA:  Back pain after a fall 3 days ago. EXAM: LUMBAR SPINE - COMPLETE 4+ VIEW COMPARISON:  03/24/2020 FINDINGS: Five lumbar type vertebral bodies. Diffuse degenerative change with prominent bridging anterior osteophytes throughout the lumbar spine. No vertebral compression deformities. No focal bone lesion or bone destruction. Visualized sacrum appears intact. IMPRESSION: Degenerative changes in the lumbar spine. No acute displaced fractures identified. Electronically Signed   By: Lucienne Capers M.D.   On: 04/23/2020 04:42   DG Knee Complete 4 Views Left  Result Date: 04/23/2020 CLINICAL DATA:  Fall 3 days ago.  Low back and left knee pain. EXAM: LEFT KNEE - COMPLETE 4+ VIEW COMPARISON:  None. FINDINGS: Mild degenerative changes with medial and lateral compartment narrowing and small osteophyte formation in all 3 compartments. Prominent enthesophytes on the patella and tibial tubercle. No evidence of acute fracture or dislocation. No focal bone lesions. No significant effusions. IMPRESSION:  Mild degenerative changes in the left knee. No acute bony abnormalities. Electronically Signed   By: Lucienne Capers M.D.   On: 04/23/2020 04:41    Procedures Procedures (including critical care time)  Medications Ordered in ED Medications  acetaminophen (TYLENOL) tablet 1,000 mg (has no administration in time range)  lidocaine (LIDODERM) 5 % 2 patch (has no administration in time range)    ED Course  I have reviewed the triage vital signs and the nursing notes.  Pertinent labs & imaging results that were available during my care of the patient were reviewed by me and considered in my medical decision making (see chart for details).    Tylenol and volatern gel for pain.  Ice as needed.  Exam is benign and reassuring as are xrays.    [name] was evaluated in Emergency Department on 04/23/2020 for the symptoms described in the history of present illness. He was evaluated in the context of the global COVID-19 pandemic, which necessitated consideration that the patient might be at risk for infection with the SARS-CoV-2 virus that causes COVID-19. Institutional protocols and algorithms that pertain to the evaluation of patients at risk for COVID-19 are in a state of rapid change based on information released by regulatory bodies including the CDC and federal and state organizations. These policies and algorithms were followed during the patient's care in the ED.  Final Clinical Impression(s) / ED Diagnoses Return for intractable cough, coughing up blood,fevers >100.4 unrelieved by medication, shortness of breath, intractable vomiting, chest pain, shortness of breath, weakness,numbness, changes in speech, facial asymmetry,abdominal pain, passing out,Inability to tolerate liquids or food, cough, altered mental status or any concerns. No signs of systemic illness or infection. The patient is nontoxic-appearing on exam and vital signs are within normal limits.   I have reviewed the  triage vital signs and the nursing notes. Pertinent labs &imaging results that were available during my care of the patient were reviewed by me and considered  in my medical decision making (see chart for details).After history, exam, and medical workup I feel the patient has beenappropriately medically screened and is safe for discharge home. Pertinent diagnoses were discussed with the patient. Patient was given return precautions.    Wood Novacek, MD 04/23/20 3358

## 2020-04-24 ENCOUNTER — Other Ambulatory Visit: Payer: Self-pay

## 2020-04-24 ENCOUNTER — Encounter: Payer: Self-pay | Admitting: Physical Therapy

## 2020-04-24 ENCOUNTER — Ambulatory Visit: Payer: Medicare Other | Admitting: Physical Therapy

## 2020-04-24 DIAGNOSIS — M6281 Muscle weakness (generalized): Secondary | ICD-10-CM

## 2020-04-24 DIAGNOSIS — G8929 Other chronic pain: Secondary | ICD-10-CM | POA: Diagnosis not present

## 2020-04-24 DIAGNOSIS — M5441 Lumbago with sciatica, right side: Secondary | ICD-10-CM | POA: Diagnosis not present

## 2020-04-24 NOTE — Therapy (Signed)
[facility] [address] [place], [place], [address] Phone: [phone]   Fax:  [phone]  Physical Therapy Treatment  Patient Details  Name: [name] MRN: [id number] Date of Birth: Dec 23, 1960 Referring Provider (PT): Rodell Perna, MD   Encounter Date: 04/24/2020   PT End of Session - 04/24/20 1444    Visit Number 5    Date for PT Re-Evaluation 05/14/20    Authorization Type Medicare    Progress Note Due on Visit 10    PT Start Time 1445    PT Stop Time 1535    PT Time Calculation (min) 50 min    Activity Tolerance Patient tolerated treatment well    Behavior During Therapy [facility] for tasks assessed/performed           Past Medical History:  Diagnosis Date  . A-fib ([place])   . Arrhythmia   . Chronic a-fib ([place])   . Closed fracture of left distal radius   . Depression   . Depression   . Hypothyroidism   . Low back pain   . Morbid obesity, BMI unknown ([place])   . Osteoarthritis    oa in bilateral knees  . Schizophrenia ([place])   . Seizures ([place])   . Thyroid disease   . Varicose veins     Past Surgical History:  Procedure Laterality Date  . ACHILLES TENDON REPAIR  approx. 2004   right foot  . CATARACT EXTRACTION     bilateral  . OPEN REDUCTION INTERNAL FIXATION (ORIF) DISTAL RADIAL FRACTURE Left 07/21/2018   Procedure: OPEN REDUCTION INTERNAL FIXATION (ORIF) DISTAL RADIAL FRACTURE;  Surgeon: [name], MD;  Location: [facility];  Service: Orthopedics;  Laterality: Left;  . stab phlebectomy  Right 11/09/2016   stab phlebectomy R leg by Tinnie Gens MD  . TOOTH EXTRACTION      There were no vitals filed for this visit.   Subjective Assessment - 04/24/20 1445    Subjective I had a fall stepping on a mound of dirt laterally. I fell ono the pavement. I am nervous about walking and afraid of falling. I did really good after you did the electric thing on my back.    Pertinent History  drepression, spinal stenosis, schizophrenia    Currently in Pain? Yes    Pain Score 5     Pain Location Back    Pain Orientation Lower;Left    Pain Descriptors / Indicators Sore    Aggravating Factors  After I fell my back hurt more    Pain Relieving Factors pain patches I got from the ER    Multiple Pain Sites No                             [facility] Adult PT Treatment/Exercise - 04/24/20 0001      Lumbar Exercises: Stretches   Active Hamstring Stretch Left;Right;2 reps;20 seconds    Single Knee to Chest Stretch Right;Left;3 reps;10 seconds    Lower Trunk Rotation 2 reps;10 seconds   Bil     Lumbar Exercises: Aerobic   Nustep L2 x 10 min   PTA present to monitor pain     Lumbar Exercises: Supine   Bridge 5 reps;2 seconds      Electrical Stimulation   Electrical Stimulation Location bil low back    Electrical Stimulation Action IFC    Electrical Stimulation Parameters 15 min  Electrical Stimulation Goals Pain                    PT Short Term Goals - 04/24/20 1510      PT SHORT TERM GOAL #1   Title be independent in initial HEP    Time 4    Period Weeks    Status Achieved    Target Date 04/16/20             PT Long Term Goals - 03/19/20 1253      PT LONG TERM GOAL #1   Title be independent in advanced HEP    Time 8    Period Weeks    Status New    Target Date 05/14/20      PT LONG TERM GOAL #2   Title reduce FOTO to < or = to 41% limitation    Time 8    Period Weeks    Status New    Target Date 05/14/20      PT LONG TERM GOAL #3   Title improve LE strength and endurance to walk for 30 minutes to improve endurance in the community    Time 8    Period Weeks    Status New    Target Date 05/14/20      PT LONG TERM GOAL #4   Title improve 5x sit to stand to < or = to 15 seconds to reduce falls risk    Time 8    Period Weeks    Status New    Target Date 05/14/20      PT LONG TERM GOAL #5   Title improve LE strength to  perform sit to stand transition without UE support and demonstrate control with descent    Time 8    Period Weeks    Status New    Target Date 05/14/20                 Plan - 04/24/20 1507    Clinical Impression Statement Pt reports he was seen in the ED after sustaining a fall. His back pain has been worse since the fall and he reports he very afraid and anxious walking becasue he has fallen a few times. Back pain was improved with a few stretches and pt really enjoys doing the Nustep.Marland Kitchen Pt requested trying the "electric thing" again as he had a few really good day safter doing that at his last session.    Personal Factors and Comorbidities Comorbidity 2    Comorbidities spinal stenosis, depression, lives alone, schizophrenic disorder    Examination-Activity Limitations Stand;Squat;Sit;Locomotion Level;Transfers    Examination-Participation Restrictions Community Activity;Driving;Yard Work;Shop    Stability/Clinical Decision Making Evolving/Moderate complexity    Rehab Potential Good    PT Frequency 2x / week    PT Duration 8 weeks    PT Treatment/Interventions ADLs/Self Care Home Management;Cryotherapy;Electrical Stimulation;Moist Heat;Gait training;Stair training;Functional mobility training;Therapeutic activities;Therapeutic exercise;Balance training;Neuromuscular re-education;Manual techniques;Patient/family education;Dry needling;Spinal Manipulations;Joint Manipulations;Taping    PT Next Visit Plan e-stim for pain, gentle exercise for strength, endurance and flexibility as pt tolerates    PT Home Exercise Plan Access Code: [email or web address]    Consulted and Agree with Plan of Care Patient           Patient will benefit from skilled therapeutic intervention in order to improve the following deficits and impairments:  Abnormal gait, Decreased activity tolerance, Postural dysfunction, Decreased strength, Improper body mechanics, Impaired flexibility, Decreased balance, Pain, Increased  muscle spasms, Decreased  endurance, Decreased range of motion  Visit Diagnosis: Chronic bilateral low back pain with right-sided sciatica  Muscle weakness (generalized)     Problem List Patient Active Problem List   Diagnosis Date Noted  . Right hip pain 01/26/2020  . Abscess 01/26/2020  . Spinal stenosis of lumbar region 01/16/2020  . Chronic lumbar radiculopathy 12/08/2019  . Hyperlipidemia associated with type 2 diabetes mellitus ([place]) 12/08/2019  . Dizziness 11/03/2019  . Paronychia of left index finger 05/19/2019  . Fall at home 11/24/2018  . Rosacea 10/24/2018  . Loss of weight 09/09/2018  . Intertrigo 04/08/2018  . Schizoaffective disorder, depressive type ([place]) 04/08/2018  . Cough 10/20/2017  . Left foot pain 04/16/2017  . Contact dermatitis 04/16/2017  . Varicose veins of bilateral lower extremities with other complications 09/81/1914  . Erectile dysfunction 04/26/2015  . Hypothyroidism   . Seizures ([place])   . Depression   . Persistent atrial fibrillation ([place])   . Obesity, Class III, BMI 40-49.9 (morbid obesity) ([place])   . Seborrheic dermatitis 02/10/2007  . T2DM (type 2 diabetes mellitus) ([place]) 02/10/2007    Maleigh Bagot,PTA 04/24/2020, 3:20 PM  [facility] [address], [place], [place], [address] Phone: [phone]   Fax:  [phone]  Name: [name] MRN: [id number] Date of Birth: 08-05-1960

## 2020-05-06 ENCOUNTER — Other Ambulatory Visit: Payer: Self-pay

## 2020-05-06 ENCOUNTER — Ambulatory Visit: Payer: Medicare Other | Attending: Family Medicine

## 2020-05-06 DIAGNOSIS — G8929 Other chronic pain: Secondary | ICD-10-CM

## 2020-05-06 DIAGNOSIS — M6281 Muscle weakness (generalized): Secondary | ICD-10-CM | POA: Diagnosis not present

## 2020-05-06 DIAGNOSIS — M5441 Lumbago with sciatica, right side: Secondary | ICD-10-CM | POA: Insufficient documentation

## 2020-05-06 NOTE — Therapy (Signed)
[facility] [address], [place], [place], [address] Phone: [phone]   Fax:  [phone]  Physical Therapy Treatment  Patient Details  Name: [name] MRN: [id number] Date of Birth: 1961/02/14 Referring Provider (PT): Rodell Perna, MD   Encounter Date: 05/06/2020   PT End of Session - 05/06/20 1140    Visit Number 6    Date for PT Re-Evaluation 05/14/20    Authorization Type Medicare    Progress Note Due on Visit 10    PT Start Time 1109    PT Stop Time 1159    PT Time Calculation (min) 50 min    Activity Tolerance Patient tolerated treatment well    Behavior During Therapy [facility] for tasks assessed/performed           Past Medical History:  Diagnosis Date  . A-fib ([place])   . Arrhythmia   . Chronic a-fib ([place])   . Closed fracture of left distal radius   . Depression   . Depression   . Hypothyroidism   . Low back pain   . Morbid obesity, BMI unknown ([place])   . Osteoarthritis    oa in bilateral knees  . Schizophrenia ([place])   . Seizures ([place])   . Thyroid disease   . Varicose veins     Past Surgical History:  Procedure Laterality Date  . ACHILLES TENDON REPAIR  approx. 2004   right foot  . CATARACT EXTRACTION     bilateral  . OPEN REDUCTION INTERNAL FIXATION (ORIF) DISTAL RADIAL FRACTURE Left 07/21/2018   Procedure: OPEN REDUCTION INTERNAL FIXATION (ORIF) DISTAL RADIAL FRACTURE;  Surgeon: [name], MD;  Location: [place];  Service: Orthopedics;  Laterality: Left;  . stab phlebectomy  Right 11/09/2016   stab phlebectomy R leg by Tinnie Gens MD  . TOOTH EXTRACTION      There were no vitals filed for this visit.   Subjective Assessment - 05/06/20 1115    Subjective I had so much back pain on Saturday that I almost went to the emergency room.    Currently in Pain? No/denies   up to 10/10 this weekend             [facility] PT Assessment - 05/06/20 0001      Transfers    Five time sit to stand comments  16.93 seconds without hands                          [facility] Adult PT Treatment/Exercise - 05/06/20 0001      Lumbar Exercises: Stretches   Active Hamstring Stretch Left;Right;2 reps;20 seconds    Single Knee to Chest Stretch Right;Left;3 reps;10 seconds    Lower Trunk Rotation 2 reps;10 seconds   Bil     Lumbar Exercises: Aerobic   Nustep L2 x 10 min   PT present to monitor pain     Lumbar Exercises: Supine   Ab Set 10 reps    AB Set Limitations with ball squeeze       Knee/Hip Exercises: Seated   Sit to Sand 2 sets;5 reps;without UE support      Moist Heat Therapy   Number Minutes Moist Heat 15 Minutes    Moist Heat Location Lumbar Spine      Electrical Stimulation   Electrical Stimulation Location bil low back    Electrical Stimulation Action IFC    Electrical Stimulation Parameters 15 minutes  Electrical Stimulation Goals Pain                    PT Short Term Goals - 05/06/20 1115      PT SHORT TERM GOAL #3   Title improve strength to stand and walk for > or =to 20 minutes without limitation    Baseline able to walk a mile to a hockey game and then had a lot of LBP    Status On-going             PT Long Term Goals - 03/19/20 1253      PT LONG TERM GOAL #1   Title be independent in advanced HEP    Time 8    Period Weeks    Status New    Target Date 05/14/20      PT LONG TERM GOAL #2   Title reduce FOTO to < or = to 41% limitation    Time 8    Period Weeks    Status New    Target Date 05/14/20      PT LONG TERM GOAL #3   Title improve LE strength and endurance to walk for 30 minutes to improve endurance in the community    Time 8    Period Weeks    Status New    Target Date 05/14/20      PT LONG TERM GOAL #4   Title improve 5x sit to stand to < or = to 15 seconds to reduce falls risk    Time 8    Period Weeks    Status New    Target Date 05/14/20      PT LONG TERM GOAL #5   Title  improve LE strength to perform sit to stand transition without UE support and demonstrate control with descent    Time 8    Period Weeks    Status New    Target Date 05/14/20                 Plan - 05/06/20 1132    Clinical Impression Statement Pt arrived without pain today.  Low back pain 2 days ago was severe and he had to lay on heating pad and considered going to the emergency room.  Pt had a fall 2 weeks ago that exacerbated his symptoms.  Pt is able to participate in low level exercise in the clinic without limitation.  5x sit to stand is 16.5 seconds indicating that he is at a falls risk.  Pt responds well to electrical stimulation post-session for pain management induced by activity.  Pt will continue to benefit from skilled PT to address chronic pain, functional strength deficits and flexibility.    PT Frequency 2x / week    PT Duration 8 weeks    PT Treatment/Interventions ADLs/Self Care Home Management;Cryotherapy;Electrical Stimulation;Moist Heat;Gait training;Stair training;Functional mobility training;Therapeutic activities;Therapeutic exercise;Balance training;Neuromuscular re-education;Manual techniques;Patient/family education;Dry needling;Spinal Manipulations;Joint Manipulations;Taping    PT Next Visit Plan e-stim for pain, gentle exercise for strength, endurance and flexibility as pt tolerates    PT Home Exercise Plan Access Code: [email or web address]    Consulted and Agree with Plan of Care Patient           Patient will benefit from skilled therapeutic intervention in order to improve the following deficits and impairments:  Abnormal gait, Decreased activity tolerance, Postural dysfunction, Decreased strength, Improper body mechanics, Impaired flexibility, Decreased balance, Pain, Increased muscle spasms, Decreased endurance, Decreased range of motion  Visit  Diagnosis: Chronic bilateral low back pain with right-sided sciatica  Muscle weakness (generalized)     Problem  List Patient Active Problem List   Diagnosis Date Noted  . Right hip pain 01/26/2020  . Abscess 01/26/2020  . Spinal stenosis of lumbar region 01/16/2020  . Chronic lumbar radiculopathy 12/08/2019  . Hyperlipidemia associated with type 2 diabetes mellitus ([place]) 12/08/2019  . Dizziness 11/03/2019  . Paronychia of left index finger 05/19/2019  . Fall at home 11/24/2018  . Rosacea 10/24/2018  . Loss of weight 09/09/2018  . Intertrigo 04/08/2018  . Schizoaffective disorder, depressive type ([place]) 04/08/2018  . Cough 10/20/2017  . Left foot pain 04/16/2017  . Contact dermatitis 04/16/2017  . Varicose veins of bilateral lower extremities with other complications 50/09/7046  . Erectile dysfunction 04/26/2015  . Hypothyroidism   . Seizures ([place])   . Depression   . Persistent atrial fibrillation ([place])   . Obesity, Class III, BMI 40-49.9 (morbid obesity) ([place])   . Seborrheic dermatitis 02/10/2007  . T2DM (type 2 diabetes mellitus) ([place]) 02/10/2007     Sigurd Sos, PT 05/06/20 11:47 AM  [facility] [address] [place], [place], [address] Phone: [phone]   Fax:  [phone]  Name: [name] MRN: [id number] Date of Birth: 1960/09/11

## 2020-05-08 ENCOUNTER — Ambulatory Visit: Payer: Medicare Other | Admitting: Physical Therapy

## 2020-05-08 ENCOUNTER — Encounter: Payer: Self-pay | Admitting: Physical Therapy

## 2020-05-08 ENCOUNTER — Other Ambulatory Visit: Payer: Self-pay

## 2020-05-08 DIAGNOSIS — M6281 Muscle weakness (generalized): Secondary | ICD-10-CM

## 2020-05-08 DIAGNOSIS — M5441 Lumbago with sciatica, right side: Secondary | ICD-10-CM | POA: Diagnosis not present

## 2020-05-08 DIAGNOSIS — G8929 Other chronic pain: Secondary | ICD-10-CM

## 2020-05-08 NOTE — Therapy (Signed)
[facility] [address] [place], [place], [address] Phone: [phone]   Fax:  [phone]  Physical Therapy Treatment  Patient Details  Name: [name] MRN: [id number] Date of Birth: 1961/06/01 Referring Provider (PT): Rodell Perna, MD   Encounter Date: 05/08/2020   PT End of Session - 05/08/20 1228    Visit Number 7    Date for PT Re-Evaluation 05/14/20    Authorization Type Medicare    Progress Note Due on Visit 10    PT Start Time 1226    PT Stop Time 1307    PT Time Calculation (min) 41 min    Activity Tolerance Patient tolerated treatment well    Behavior During Therapy [facility] for tasks assessed/performed           Past Medical History:  Diagnosis Date  . A-fib ([place])   . Arrhythmia   . Chronic a-fib ([place])   . Closed fracture of left distal radius   . Depression   . Depression   . Hypothyroidism   . Low back pain   . Morbid obesity, BMI unknown ([place])   . Osteoarthritis    oa in bilateral knees  . Schizophrenia ([place])   . Seizures ([place])   . Thyroid disease   . Varicose veins     Past Surgical History:  Procedure Laterality Date  . ACHILLES TENDON REPAIR  approx. 2004   right foot  . CATARACT EXTRACTION     bilateral  . OPEN REDUCTION INTERNAL FIXATION (ORIF) DISTAL RADIAL FRACTURE Left 07/21/2018   Procedure: OPEN REDUCTION INTERNAL FIXATION (ORIF) DISTAL RADIAL FRACTURE;  Surgeon: [name], MD;  Location: [place];  Service: Orthopedics;  Laterality: Left;  . stab phlebectomy  Right 11/09/2016   stab phlebectomy R leg by Tinnie Gens MD  . TOOTH EXTRACTION      There were no vitals filed for this visit.   Subjective Assessment - 05/08/20 1230    Subjective Had knee pain upon waking but is better now. LBP low level this AM.    Pertinent History drepression, spinal stenosis, schizophrenia    Limitations Walking    Currently in Pain? Yes    Pain Location Back     Pain Orientation Lower    Pain Descriptors / Indicators Dull    Aggravating Factors  Random    Pain Relieving Factors Maybe stretching                             [facility] Adult PT Treatment/Exercise - 05/08/20 0001      Lumbar Exercises: Stretches   Active Hamstring Stretch Left;Right;2 reps;20 seconds    Active Hamstring Stretch Limitations seated      Lumbar Exercises: Aerobic   Nustep L2 x 10 min   PTA present to monitor pain     Lumbar Exercises: Seated   Other Seated Lumbar Exercises trunk rotation 10x with cane    VC to rotate cervical:      Knee/Hip Exercises: Standing   Knee Flexion Strengthening;Both;1 set;10 reps    Knee Flexion Limitations 2#    Hip Flexion Stengthening;Both;1 set;10 reps;Knee bent    Hip Flexion Limitations 2#    Hip Abduction Stengthening;Both;1 set;10 reps;Knee straight    Abduction Limitations 2#      Knee/Hip Exercises: Seated   Long Arc Quad AAROM;Both;1 set;10 reps;Weights    Long Arc Quad Weight 2 lbs.  Clamshell with [employer] --   yellow loop 10x2   Sit to Sand 2 sets;5 reps;without UE support                    PT Short Term Goals - 05/06/20 1115      PT SHORT TERM GOAL #3   Title improve strength to stand and walk for > or =to 20 minutes without limitation    Baseline able to walk a mile to a hockey game and then had a lot of LBP    Status On-going             PT Long Term Goals - 03/19/20 1253      PT LONG TERM GOAL #1   Title be independent in advanced HEP    Time 8    Period Weeks    Status New    Target Date 05/14/20      PT LONG TERM GOAL #2   Title reduce FOTO to < or = to 41% limitation    Time 8    Period Weeks    Status New    Target Date 05/14/20      PT LONG TERM GOAL #3   Title improve LE strength and endurance to walk for 30 minutes to improve endurance in the community    Time 8    Period Weeks    Status New    Target Date 05/14/20      PT LONG TERM GOAL #4   Title  improve 5x sit to stand to < or = to 15 seconds to reduce falls risk    Time 8    Period Weeks    Status New    Target Date 05/14/20      PT LONG TERM GOAL #5   Title improve LE strength to perform sit to stand transition without UE support and demonstrate control with descent    Time 8    Period Weeks    Status New    Target Date 05/14/20                 Plan - 05/08/20 1228    Clinical Impression Statement Pt arrives with low level back pain. Pt was bale to do a combination of seated and standing stretches and strengthening. Pt was also able to add light resisatnce for hip & knee strengthening exercises, which next session can probably be increased. Pt's back did get fatigued sitting edge of mat for most of todays session. PTA advised pt to rest with some heat to his back when he got home.    Personal Factors and Comorbidities Comorbidity 2    Comorbidities spinal stenosis, depression, lives alone, schizophrenic disorder    Examination-Activity Limitations Stand;Squat;Sit;Locomotion Level;Transfers    Examination-Participation Restrictions Community Activity;Driving;Yard Work;Shop    Stability/Clinical Decision Making Evolving/Moderate complexity    Rehab Potential Good    PT Frequency 2x / week    PT Duration 8 weeks    PT Treatment/Interventions ADLs/Self Care Home Management;Cryotherapy;Electrical Stimulation;Moist Heat;Gait training;Stair training;Functional mobility training;Therapeutic activities;Therapeutic exercise;Balance training;Neuromuscular re-education;Manual techniques;Patient/family education;Dry needling;Spinal Manipulations;Joint Manipulations;Taping    PT Next Visit Plan gentle exercise for strength, endurance and flexibility as pt tolerates    PT Home Exercise Plan Access Code: [email or web address]    Consulted and Agree with Plan of Care Patient           Patient will benefit from skilled therapeutic intervention in order to improve the following deficits and  impairments:  Abnormal gait, Decreased activity tolerance, Postural dysfunction, Decreased strength, Improper body mechanics, Impaired flexibility, Decreased balance, Pain, Increased muscle spasms, Decreased endurance, Decreased range of motion  Visit Diagnosis: Chronic bilateral low back pain with right-sided sciatica  Muscle weakness (generalized)     Problem List Patient Active Problem List   Diagnosis Date Noted  . Right hip pain 01/26/2020  . Abscess 01/26/2020  . Spinal stenosis of lumbar region 01/16/2020  . Chronic lumbar radiculopathy 12/08/2019  . Hyperlipidemia associated with type 2 diabetes mellitus ([place]) 12/08/2019  . Dizziness 11/03/2019  . Paronychia of left index finger 05/19/2019  . Fall at home 11/24/2018  . Rosacea 10/24/2018  . Loss of weight 09/09/2018  . Intertrigo 04/08/2018  . Schizoaffective disorder, depressive type ([place]) 04/08/2018  . Cough 10/20/2017  . Left foot pain 04/16/2017  . Contact dermatitis 04/16/2017  . Varicose veins of bilateral lower extremities with other complications 56/38/9373  . Erectile dysfunction 04/26/2015  . Hypothyroidism   . Seizures ([place])   . Depression   . Persistent atrial fibrillation ([place])   . Obesity, Class III, BMI 40-49.9 (morbid obesity) ([place])   . Seborrheic dermatitis 02/10/2007  . T2DM (type 2 diabetes mellitus) ([place]) 02/10/2007    Scott Vanderveer, PTA 05/08/2020, 1:08 PM  [facility] [address], [place], [place], [address] Phone: [phone]   Fax:  [phone]  Name: PETRO TALENT MRN: [id number] Date of Birth: May 18, 1961

## 2020-05-13 ENCOUNTER — Other Ambulatory Visit: Payer: Self-pay

## 2020-05-13 ENCOUNTER — Ambulatory Visit: Payer: Medicare Other | Admitting: Physical Therapy

## 2020-05-13 ENCOUNTER — Encounter: Payer: Self-pay | Admitting: Physical Therapy

## 2020-05-13 DIAGNOSIS — M5441 Lumbago with sciatica, right side: Secondary | ICD-10-CM | POA: Diagnosis not present

## 2020-05-13 DIAGNOSIS — M6281 Muscle weakness (generalized): Secondary | ICD-10-CM

## 2020-05-13 DIAGNOSIS — G8929 Other chronic pain: Secondary | ICD-10-CM

## 2020-05-13 NOTE — Therapy (Signed)
[facility] [address] [place], [place], [address] Phone: [phone]   Fax:  [phone]  Physical Therapy Treatment  Patient Details  Name: [name] MRN: [id number] Date of Birth: Aug 17, 1960 Referring Provider (PT): Rodell Perna, MD   Encounter Date: 05/13/2020   PT End of Session - 05/13/20 1243    Visit Number 8    Date for PT Re-Evaluation 05/14/20    Authorization Type Medicare    Progress Note Due on Visit 10    PT Start Time 1242   pt 12 min  late   PT Stop Time 1310    PT Time Calculation (min) 28 min    Activity Tolerance Patient tolerated treatment well    Behavior During Therapy [facility] for tasks assessed/performed           Past Medical History:  Diagnosis Date  . A-fib ([place])   . Arrhythmia   . Chronic a-fib ([place])   . Closed fracture of left distal radius   . Depression   . Depression   . Hypothyroidism   . Low back pain   . Morbid obesity, BMI unknown ([place])   . Osteoarthritis    oa in bilateral knees  . Schizophrenia ([place])   . Seizures ([place])   . Thyroid disease   . Varicose veins     Past Surgical History:  Procedure Laterality Date  . ACHILLES TENDON REPAIR  approx. 2004   right foot  . CATARACT EXTRACTION     bilateral  . OPEN REDUCTION INTERNAL FIXATION (ORIF) DISTAL RADIAL FRACTURE Left 07/21/2018   Procedure: OPEN REDUCTION INTERNAL FIXATION (ORIF) DISTAL RADIAL FRACTURE;  Surgeon: [name], MD;  Location: [place];  Service: Orthopedics;  Laterality: Left;  . stab phlebectomy  Right 11/09/2016   stab phlebectomy R leg by Tinnie Gens MD  . TOOTH EXTRACTION      There were no vitals filed for this visit.   Subjective Assessment - 05/13/20 1244    Subjective Sorry i am late, I didn't want to get out of bed. I am just a stiff this morning.    Pertinent History drepression, spinal stenosis, schizophrenia    Currently in Pain? No/denies     Aggravating Factors  Random    Pain Relieving Factors Stretching    Multiple Pain Sites No                             [facility] Adult PT Treatment/Exercise - 05/13/20 0001      Lumbar Exercises: Stretches   Active Hamstring Stretch Right;Left;2 reps;30 seconds    Active Hamstring Stretch Limitations seated      Lumbar Exercises: Aerobic   Nustep L2 x 10 min at end of session      Knee/Hip Exercises: Standing   Heel Raises Both;1 set;10 reps    Heel Raises Limitations 3#    Knee Flexion Strengthening;Both;1 set;10 reps    Knee Flexion Limitations 3#    Hip Flexion Stengthening;Both;1 set;10 reps;Knee bent    Hip Flexion Limitations 3#    Hip Abduction Stengthening;Both;1 set;10 reps;Knee straight    Abduction Limitations 3#      Knee/Hip Exercises: Seated   Long Arc Quad Strengthening;Both;1 set;10 reps;Weights    Long Arc Quad Weight 3 lbs.   Sitting on 2 black pads  PT Short Term Goals - 05/06/20 1115      PT SHORT TERM GOAL #3   Title improve strength to stand and walk for > or =to 20 minutes without limitation    Baseline able to walk a mile to a hockey game and then had a lot of LBP    Status On-going             PT Long Term Goals - 05/13/20 1301      PT LONG TERM GOAL #1   Title be independent in advanced HEP    Time 8    Period Weeks    Status On-going                 Plan - 05/13/20 1243    Clinical Impression Statement Pt arrives 12 min late today, treatment time limited. Pt responding well to LE strengthening with resisatnce. Pt has not had any falls to report and feels after his PT sessions his walking really improves. Getting out of a chair is 50% easier.    Personal Factors and Comorbidities Comorbidity 2    Comorbidities spinal stenosis, depression, lives alone, schizophrenic disorder    Examination-Activity Limitations Stand;Squat;Sit;Locomotion Level;Transfers    Examination-Participation  Restrictions Community Activity;Driving;Yard Work;Shop    Stability/Clinical Decision Making Evolving/Moderate complexity    Rehab Potential Good    PT Frequency 2x / week    PT Duration 8 weeks    PT Treatment/Interventions ADLs/Self Care Home Management;Cryotherapy;Electrical Stimulation;Moist Heat;Gait training;Stair training;Functional mobility training;Therapeutic activities;Therapeutic exercise;Balance training;Neuromuscular re-education;Manual techniques;Patient/family education;Dry needling;Spinal Manipulations;Joint Manipulations;Taping    PT Next Visit Plan gentle exercise for strength, endurance and flexibility as pt tolerates: check sit to stand goal.    PT Home Exercise Plan Access Code: [email or web address]    Consulted and Agree with Plan of Care Patient           Patient will benefit from skilled therapeutic intervention in order to improve the following deficits and impairments:  Abnormal gait, Decreased activity tolerance, Postural dysfunction, Decreased strength, Improper body mechanics, Impaired flexibility, Decreased balance, Pain, Increased muscle spasms, Decreased endurance, Decreased range of motion  Visit Diagnosis: Chronic bilateral low back pain with right-sided sciatica  Muscle weakness (generalized)     Problem List Patient Active Problem List   Diagnosis Date Noted  . Right hip pain 01/26/2020  . Abscess 01/26/2020  . Spinal stenosis of lumbar region 01/16/2020  . Chronic lumbar radiculopathy 12/08/2019  . Hyperlipidemia associated with type 2 diabetes mellitus ([place]) 12/08/2019  . Dizziness 11/03/2019  . Paronychia of left index finger 05/19/2019  . Fall at home 11/24/2018  . Rosacea 10/24/2018  . Loss of weight 09/09/2018  . Intertrigo 04/08/2018  . Schizoaffective disorder, depressive type ([place]) 04/08/2018  . Cough 10/20/2017  . Left foot pain 04/16/2017  . Contact dermatitis 04/16/2017  . Varicose veins of bilateral lower extremities with other  complications 35/59/7416  . Erectile dysfunction 04/26/2015  . Hypothyroidism   . Seizures ([place])   . Depression   . Persistent atrial fibrillation ([place])   . Obesity, Class III, BMI 40-49.9 (morbid obesity) ([place])   . Seborrheic dermatitis 02/10/2007  . T2DM (type 2 diabetes mellitus) ([place]) 02/10/2007    Gian Ybarra, PTA 05/13/2020, 1:02 PM  [facility] [address], [place], [place], [address] Phone: [phone]   Fax:  [phone]  Name: [name] MRN: [id number] Date of Birth: 08/27/1960

## 2020-05-15 ENCOUNTER — Ambulatory Visit: Payer: Medicare Other

## 2020-05-15 ENCOUNTER — Other Ambulatory Visit: Payer: Self-pay

## 2020-05-15 DIAGNOSIS — G8929 Other chronic pain: Secondary | ICD-10-CM | POA: Diagnosis not present

## 2020-05-15 DIAGNOSIS — M6281 Muscle weakness (generalized): Secondary | ICD-10-CM

## 2020-05-15 DIAGNOSIS — M5441 Lumbago with sciatica, right side: Secondary | ICD-10-CM | POA: Diagnosis not present

## 2020-05-15 NOTE — Therapy (Signed)
[facility] [address], [place], [place], [address] Phone: [phone]   Fax:  [phone]  Physical Therapy Treatment  Patient Details  Name: [name] MRN: [id number] Date of Birth: [birth year] Referring Provider (PT): Rodell Perna, MD   Encounter Date: 05/15/2020   PT End of Session - 05/15/20 1317    Visit Number 9    Date for PT Re-Evaluation 07/10/20    Authorization Type Medicare    Progress Note Due on Visit 10    PT Start Time 1232    PT Stop Time 1310   pt had a break in the bathroom so shortened session   PT Time Calculation (min) 38 min    Activity Tolerance Patient tolerated treatment well    Behavior During Therapy [facility] for tasks assessed/performed           Past Medical History:  Diagnosis Date  . A-fib ([place])   . Arrhythmia   . Chronic a-fib ([place])   . Closed fracture of left distal radius   . Depression   . Depression   . Hypothyroidism   . Low back pain   . Morbid obesity, BMI unknown ([place])   . Osteoarthritis    oa in bilateral knees  . Schizophrenia ([place])   . Seizures ([place])   . Thyroid disease   . Varicose veins     Past Surgical History:  Procedure Laterality Date  . ACHILLES TENDON REPAIR  approx. 2004   right foot  . CATARACT EXTRACTION     bilateral  . OPEN REDUCTION INTERNAL FIXATION (ORIF) DISTAL RADIAL FRACTURE Left 07/21/2018   Procedure: OPEN REDUCTION INTERNAL FIXATION (ORIF) DISTAL RADIAL FRACTURE;  Surgeon: [name], MD;  Location: [place];  Service: Orthopedics;  Laterality: Left;  . stab phlebectomy  Right 11/09/2016   stab phlebectomy R leg by Tinnie Gens MD  . TOOTH EXTRACTION      There were no vitals filed for this visit.   Subjective Assessment - 05/15/20 1239    Subjective I feel 70% overall improvement since the start of care.    Patient Stated Goals improve ability to sit, stand and walk with less pain    Currently in Pain?  Yes    Pain Score 2     Pain Location Back    Pain Orientation Lower;Left;Right    Pain Descriptors / Indicators Dull    Pain Type Acute pain    Pain Onset More than a month ago    Pain Frequency Constant              [facility] PT Assessment - 05/15/20 0001      Assessment   Medical Diagnosis chronic lumbar radiculoapthy    Referring Provider (PT) Rodell Perna, MD    Onset Date/Surgical Date 03/20/19      Home Environment   Living Environment Private residence      Observation/Other Assessments   Focus on Therapeutic Outcomes (FOTO)  53% limitation      Transfers   Five time sit to stand comments  20.6 seconds with uncontrolled descent                         [facility] Adult PT Treatment/Exercise - 05/15/20 0001      Lumbar Exercises: Stretches   Active Hamstring Stretch Right;Left;2 reps;30 seconds    Active Hamstring Stretch Limitations seated      Lumbar Exercises: Aerobic  Nustep L1 x 10 min at begining of session- reduced to Level 1 due to Rt knee pain today      Knee/Hip Exercises: Standing   Heel Raises Both;1 set;10 reps    Heel Raises Limitations 3#    Knee Flexion Strengthening;Both;1 set;10 reps    Knee Flexion Limitations 3#    Hip Flexion Stengthening;Both;1 set;10 reps;Knee bent    Hip Flexion Limitations 3#    Hip Abduction Stengthening;Both;1 set;10 reps;Knee straight    Abduction Limitations 3#      Knee/Hip Exercises: Seated   Long Arc Quad Strengthening;Both;1 set;10 reps;Weights    Long Arc Quad Weight 3 lbs.   Sitting on 2 black pads   Sit to Sand 1 set;5 reps                    PT Short Term Goals - 05/06/20 1115      PT SHORT TERM GOAL #3   Title improve strength to stand and walk for > or =to 20 minutes without limitation    Baseline able to walk a mile to a hockey game and then had a lot of LBP    Status On-going             PT Long Term Goals - 05/15/20 1241      PT LONG TERM GOAL #1   Title be  independent in advanced HEP    Time 8    Period Weeks    Status On-going    Target Date 07/10/20      PT LONG TERM GOAL #2   Title reduce FOTO to < or = to 41% limitation    Baseline 53% limitation- worse from evaluation, pt reports 70% overall improvement    Time 8    Period Weeks    Status On-going    Target Date 07/10/20      PT LONG TERM GOAL #3   Title improve LE strength and endurance to walk for 30 minutes to improve endurance in the community    Baseline 20 minutes max    Time 8    Period Weeks    Status On-going      PT LONG TERM GOAL #4   Title improve 5x sit to stand to < or = to 15 seconds to reduce falls risk    Baseline 20.23 seconds    Time 8    Period Weeks    Status On-going    Target Date 07/10/20      PT LONG TERM GOAL #5   Title improve LE strength to perform sit to stand transition without UE support and demonstrate control with descent    Baseline minor use of hands    Time 8    Period Weeks    Status On-going    Target Date 07/10/20                 Plan - 05/15/20 1257    Clinical Impression Statement Pt reports 70% overall improvement in symptoms and endurance since the start of care.  Pt is now able to walk for 20 minutes at a slow pace without limitation.  Pt has improved LE functional strength to perform sit to stand with minimal UE support and demonstrates uncontrolled descent.   5x sit to stand is improved from 21.73 seconds to 20.23 seconds without UE support.  This was 16 seconds last week and might have been longer today due to knee pain.  Pt has  advanced to ankle weights with exercise in the clinic and was monitored for fatigue and verbal cues provided for technique.  Pt will continue to benefit from skilled PT to address chronic pain and limitations in strength, flexibility and endurance.    Rehab Potential Good    PT Frequency 2x / week    PT Duration 8 weeks    PT Treatment/Interventions ADLs/Self Care Home  Management;Cryotherapy;Electrical Stimulation;Moist Heat;Gait training;Stair training;Functional mobility training;Therapeutic activities;Therapeutic exercise;Balance training;Neuromuscular re-education;Manual techniques;Patient/family education;Dry needling;Spinal Manipulations;Joint Manipulations;Taping    PT Next Visit Plan gentle exercise for strength, endurance and flexibility as pt tolerates    PT Home Exercise Plan Access Code: [email or web address]    Recommended Other Services recert sent 82/95/62    Consulted and Agree with Plan of Care Patient           Patient will benefit from skilled therapeutic intervention in order to improve the following deficits and impairments:  Abnormal gait, Decreased activity tolerance, Postural dysfunction, Decreased strength, Improper body mechanics, Impaired flexibility, Decreased balance, Pain, Increased muscle spasms, Decreased endurance, Decreased range of motion  Visit Diagnosis: Chronic bilateral low back pain with right-sided sciatica - Plan: PT plan of care cert/re-cert  Muscle weakness (generalized) - Plan: PT plan of care cert/re-cert     Problem List Patient Active Problem List   Diagnosis Date Noted  . Right hip pain 01/26/2020  . Abscess 01/26/2020  . Spinal stenosis of lumbar region 01/16/2020  . Chronic lumbar radiculopathy 12/08/2019  . Hyperlipidemia associated with type 2 diabetes mellitus ([place]) 12/08/2019  . Dizziness 11/03/2019  . Paronychia of left index finger 05/19/2019  . Fall at home 11/24/2018  . Rosacea 10/24/2018  . Loss of weight 09/09/2018  . Intertrigo 04/08/2018  . Schizoaffective disorder, depressive type ([place]) 04/08/2018  . Cough 10/20/2017  . Left foot pain 04/16/2017  . Contact dermatitis 04/16/2017  . Varicose veins of bilateral lower extremities with other complications 13/02/6577  . Erectile dysfunction 04/26/2015  . Hypothyroidism   . Seizures ([place])   . Depression   . Persistent atrial fibrillation ([place])     . Obesity, Class III, BMI 40-49.9 (morbid obesity) ([place])   . Seborrheic dermatitis 02/10/2007  . T2DM (type 2 diabetes mellitus) ([place]) 02/10/2007     [name], PT 05/15/20 1:19 PM  [facility] [address] [place], [place], [address] Phone: [phone]   Fax:  [phone]  Name: [name] MRN: [id number] Date of Birth: Feb 21, 1961

## 2020-05-20 ENCOUNTER — Ambulatory Visit: Payer: Medicare Other | Admitting: Physical Therapy

## 2020-05-20 ENCOUNTER — Other Ambulatory Visit: Payer: Self-pay

## 2020-05-20 DIAGNOSIS — M6281 Muscle weakness (generalized): Secondary | ICD-10-CM

## 2020-05-20 DIAGNOSIS — G8929 Other chronic pain: Secondary | ICD-10-CM | POA: Diagnosis not present

## 2020-05-20 DIAGNOSIS — M5441 Lumbago with sciatica, right side: Secondary | ICD-10-CM | POA: Diagnosis not present

## 2020-05-20 NOTE — Therapy (Addendum)
[facility] [address] [place], [place], [address] Phone: [phone]   Fax:  [phone]  Physical Therapy Treatment  Patient Details  Name: [name] MRN: [id number] Date of Birth: 01/03/1961 Referring Provider (PT): Rodell Perna, MD   Encounter Date: 05/20/2020 Progress Note Reporting Period 03/19/2020 to 05/20/2020  See note below for Objective Data and Assessment of Progress/Goals.  Pt reports 70% overall improvement in symptoms and endurance since the start of care.  Pt is now able to walk for 20 minutes at a slow pace without limitation.  Pt has improved LE functional strength to perform sit to stand with minimal UE support and demonstrates uncontrolled descent.   5x sit to stand is improved from 21.73 seconds to 20.23 seconds without UE support.  This was 16 seconds last week and might have been longer  due to knee pain.  Pt has advanced to ankle weights with exercise in the clinic.  Progress has been slow due to chronic nature of condition and comorbidities.    Sigurd Sos, PT 05/21/20 7:16 AM       PT End of Session - 05/20/20 1231    Visit Number 10    Date for PT Re-Evaluation 07/10/20    Authorization Type Medicare    Progress Note Due on Visit 10    PT Start Time 1230    PT Stop Time 1308    PT Time Calculation (min) 38 min    Activity Tolerance Patient tolerated treatment well    Behavior During Therapy [facility] for tasks assessed/performed           Past Medical History:  Diagnosis Date  . A-fib ([place])   . Arrhythmia   . Chronic a-fib ([place])   . Closed fracture of left distal radius   . Depression   . Depression   . Hypothyroidism   . Low back pain   . Morbid obesity, BMI unknown ([place])   . Osteoarthritis    oa in bilateral knees  . Schizophrenia ([place])   . Seizures ([place])   . Thyroid disease   . Varicose veins     Past Surgical History:  Procedure Laterality Date  . ACHILLES  TENDON REPAIR  approx. 2004   right foot  . CATARACT EXTRACTION     bilateral  . OPEN REDUCTION INTERNAL FIXATION (ORIF) DISTAL RADIAL FRACTURE Left 07/21/2018   Procedure: OPEN REDUCTION INTERNAL FIXATION (ORIF) DISTAL RADIAL FRACTURE;  Surgeon: [name], MD;  Location: [place];  Service: Orthopedics;  Laterality: Left;  . stab phlebectomy  Right 11/09/2016   stab phlebectomy R leg by Tinnie Gens MD  . TOOTH EXTRACTION      There were no vitals filed for this visit.   Subjective Assessment - 05/20/20 1234    Subjective i am  a little drousey, just got up not too long ago.    Pertinent History drepression, spinal stenosis, schizophrenia    Diagnostic tests MRI: spinal stenosis L2-3 and central disc bulge L5-S1    Currently in Pain? No/denies    Multiple Pain Sites No              [facility] PT Assessment - 05/20/20 0001      Assessment   Medical Diagnosis chronic lumbar radiculoapthy    Referring Provider (PT) Rodell Perna, MD    Onset Date/Surgical Date 03/20/19      Observation/Other Assessments   Focus on Therapeutic Outcomes (FOTO)  53% limitation  Transfers   Five time sit to stand comments  20.6 seconds with uncontrolled descent                         [facility] Adult PT Treatment/Exercise - 05/20/20 0001      Lumbar Exercises: Stretches   Active Hamstring Stretch Right;Left;2 reps;30 seconds    Active Hamstring Stretch Limitations seated      Knee/Hip Exercises: Standing   Heel Raises Both;1 set;15 reps    Knee Flexion Strengthening;Both;1 set;15 reps    Knee Flexion Limitations 3#    Hip Flexion Stengthening;Both;1 set;15 reps;Knee bent    Hip Flexion Limitations 3#    Hip Abduction Stengthening;Both;1 set;15 reps;Knee straight    Abduction Limitations 3#      Knee/Hip Exercises: Seated   Long Arc Quad Strengthening;Both;1 set;15 reps;Weights    Long Arc Quad Weight 3 lbs.   Sitting on 2 black pads   Sit to Sand 1 set;10  reps                    PT Short Term Goals - 05/06/20 1115      PT SHORT TERM GOAL #3   Title improve strength to stand and walk for > or =to 20 minutes without limitation    Baseline able to walk a mile to a hockey game and then had a lot of LBP    Status On-going             PT Long Term Goals - 05/20/20 1232      PT LONG TERM GOAL #1   Title be independent in advanced HEP    Time 8    Period Weeks    Status On-going      PT LONG TERM GOAL #2   Title reduce FOTO to < or = to 41% limitation    Baseline 53% limitation- worse from evaluation, pt reports 70% overall improvement    Time 8    Status On-going      PT LONG TERM GOAL #3   Title improve LE strength and endurance to walk for 30 minutes to improve endurance in the community    Baseline 20 minutes max    Time 8    Period Weeks    Status On-going      PT LONG TERM GOAL #4   Title improve 5x sit to stand to < or = to 15 seconds to reduce falls risk    Baseline 20.23 seconds    Time 8    Period Weeks    Status On-going      PT LONG TERM GOAL #5   Title improve LE strength to perform sit to stand transition without UE support and demonstrate control with descent    Time 8    Period Weeks    Status On-going                 Plan - 05/20/20 1231    Clinical Impression Statement Pt arrives with no pain but low energy. This did improve some as pt got further into the exercises but pt was noticably slower in everything he did. Pt did increase his reps with same resistance.    Personal Factors and Comorbidities Comorbidity 2    Comorbidities spinal stenosis, depression, lives alone, schizophrenic disorder    Examination-Activity Limitations Stand;Squat;Sit;Locomotion Level;Transfers    Examination-Participation Restrictions Community Activity;Driving;Yard Work;Shop    Stability/Clinical Decision Making Evolving/Moderate complexity  Rehab Potential Good    PT Frequency 2x / week    PT  Duration 8 weeks    PT Treatment/Interventions ADLs/Self Care Home Management;Cryotherapy;Electrical Stimulation;Moist Heat;Gait training;Stair training;Functional mobility training;Therapeutic activities;Therapeutic exercise;Balance training;Neuromuscular re-education;Manual techniques;Patient/family education;Dry needling;Spinal Manipulations;Joint Manipulations;Taping    PT Next Visit Plan gentle exercise for strength, endurance and flexibility as pt tolerates    PT Home Exercise Plan Access Code: [email or web address]    Consulted and Agree with Plan of Care Patient           Patient will benefit from skilled therapeutic intervention in order to improve the following deficits and impairments:  Abnormal gait, Decreased activity tolerance, Postural dysfunction, Decreased strength, Improper body mechanics, Impaired flexibility, Decreased balance, Pain, Increased muscle spasms, Decreased endurance, Decreased range of motion  Visit Diagnosis: Chronic bilateral low back pain with right-sided sciatica  Muscle weakness (generalized)     Problem List Patient Active Problem List   Diagnosis Date Noted  . Right hip pain 01/26/2020  . Abscess 01/26/2020  . Spinal stenosis of lumbar region 01/16/2020  . Chronic lumbar radiculopathy 12/08/2019  . Hyperlipidemia associated with type 2 diabetes mellitus ([place]) 12/08/2019  . Dizziness 11/03/2019  . Paronychia of left index finger 05/19/2019  . Fall at home 11/24/2018  . Rosacea 10/24/2018  . Loss of weight 09/09/2018  . Intertrigo 04/08/2018  . Schizoaffective disorder, depressive type ([place]) 04/08/2018  . Cough 10/20/2017  . Left foot pain 04/16/2017  . Contact dermatitis 04/16/2017  . Varicose veins of bilateral lower extremities with other complications 81/07/7508  . Erectile dysfunction 04/26/2015  . Hypothyroidism   . Seizures ([place])   . Depression   . Persistent atrial fibrillation ([place])   . Obesity, Class III, BMI 40-49.9 (morbid obesity) ([place])    . Seborrheic dermatitis 02/10/2007  . T2DM (type 2 diabetes mellitus) ([place]) 02/10/2007    Myrene Galas, PTA 05/20/20 1:06 PM  [facility] [address] [place], [place], [address] Phone: [phone]   Fax:  [phone]  Name: CINQUE BEGLEY MRN: [id number] Date of Birth: 18-Jan-1961

## 2020-05-22 ENCOUNTER — Other Ambulatory Visit: Payer: Self-pay

## 2020-05-22 ENCOUNTER — Ambulatory Visit: Payer: Medicare Other

## 2020-05-22 DIAGNOSIS — M6281 Muscle weakness (generalized): Secondary | ICD-10-CM | POA: Diagnosis not present

## 2020-05-22 DIAGNOSIS — G8929 Other chronic pain: Secondary | ICD-10-CM

## 2020-05-22 DIAGNOSIS — M5441 Lumbago with sciatica, right side: Secondary | ICD-10-CM | POA: Diagnosis not present

## 2020-05-22 NOTE — Therapy (Addendum)
[facility] [address] [place], [place], [address] Phone: [phone]   Fax:  [phone]  Physical Therapy Treatment  Patient Details  Name: [name] MRN: [id number] Date of Birth: Feb 10, 1961 Referring Provider (PT): Annell Greening, MD   Encounter Date: 05/22/2020   PT End of Session - 05/22/20 1322    Visit Number 11    Date for PT Re-Evaluation 07/10/20    Authorization Type Medicare    Progress Note Due on Visit 20    PT Start Time 1248    PT Stop Time 1316    PT Time Calculation (min) 28 min    Activity Tolerance Patient tolerated treatment well    Behavior During Therapy [facility] for tasks assessed/performed           Past Medical History:  Diagnosis Date  . A-fib ([facility])   . Arrhythmia   . Chronic a-fib ([facility])   . Closed fracture of left distal radius   . Depression   . Depression   . Hypothyroidism   . Low back pain   . Morbid obesity, BMI unknown ([facility])   . Osteoarthritis    oa in bilateral knees  . Schizophrenia ([facility])   . Seizures ([facility])   . Thyroid disease   . Varicose veins     Past Surgical History:  Procedure Laterality Date  . ACHILLES TENDON REPAIR  approx. 2004   right foot  . CATARACT EXTRACTION     bilateral  . OPEN REDUCTION INTERNAL FIXATION (ORIF) DISTAL RADIAL FRACTURE Left 07/21/2018   Procedure: OPEN REDUCTION INTERNAL FIXATION (ORIF) DISTAL RADIAL FRACTURE;  Surgeon: Betha Loa, MD;  Location: [facility];  Service: Orthopedics;  Laterality: Left;  . stab phlebectomy  Right 11/09/2016   stab phlebectomy R leg by Josephina Gip MD  . TOOTH EXTRACTION      There were no vitals filed for this visit.   Subjective Assessment - 05/22/20 1251    Subjective Pt is 18 minutes late- "I dozed off"  I'm feeling better since i strained my back last week.    Currently in Pain? Yes    Pain Score 3     Pain Location Back    Pain Orientation Lower;Right;Left    Pain  Descriptors / Indicators Dull    Pain Type Acute pain    Pain Onset More than a month ago    Pain Frequency Constant    Aggravating Factors  activity    Pain Relieving Factors stretching                             [facility] Adult PT Treatment/Exercise - 05/22/20 0001      Lumbar Exercises: Aerobic   Nustep Level 2x 10 minutes    0.46 miles     Knee/Hip Exercises: Seated   Long Arc Quad Strengthening;Both;1 set;15 reps;Weights    Long Arc Quad Weight 3 lbs.   Sitting on 2 black pads   Marching Strengthening;Both;2 sets;10 reps;Weights    Marching Weights 3 lbs.      Manual Therapy   Manual Therapy Soft tissue mobilization    Manual therapy comments Addaday to Lt lumbar spine                    PT Short Term Goals - 05/06/20 1115      PT SHORT TERM GOAL #3   Title improve strength to  stand and walk for > or =to 20 minutes without limitation    Baseline able to walk a mile to a hockey game and then had a lot of LBP    Status On-going             PT Long Term Goals - 05/20/20 1232      PT LONG TERM GOAL #1   Title be independent in advanced HEP    Time 8    Period Weeks    Status On-going      PT LONG TERM GOAL #2   Title reduce FOTO to < or = to 41% limitation    Baseline 53% limitation- worse from evaluation, pt reports 70% overall improvement    Time 8    Status On-going      PT LONG TERM GOAL #3   Title improve LE strength and endurance to walk for 30 minutes to improve endurance in the community    Baseline 20 minutes max    Time 8    Period Weeks    Status On-going      PT LONG TERM GOAL #4   Title improve 5x sit to stand to < or = to 15 seconds to reduce falls risk    Baseline 20.23 seconds    Time 8    Period Weeks    Status On-going      PT LONG TERM GOAL #5   Title improve LE strength to perform sit to stand transition without UE support and demonstrate control with descent    Time 8    Period Weeks    Status  On-going                 Plan - 05/22/20 1256    Clinical Impression Statement Pt reports 70% overall improvement in symptoms and endurance since the start of care.  Pt is now able to walk for 20 minutes at a slow pace without limitation. Pt didn't do any walking this weekend due to increased LBP with an exercise last session. PT encouraged pt to do shorter distances until pain has resolved.  Pt arrived late for his appointment so limited time for exercise.   Pt has advanced to ankle weights with exercise in the clinic.  Progress has been slow due to chronic nature of condition and comorbidities.    Rehab Potential Good    PT Frequency 2x / week    PT Duration 8 weeks    PT Treatment/Interventions ADLs/Self Care Home Management;Cryotherapy;Electrical Stimulation;Moist Heat;Gait training;Stair training;Functional mobility training;Therapeutic activities;Therapeutic exercise;Balance training;Neuromuscular re-education;Manual techniques;Patient/family education;Dry needling;Spinal Manipulations;Joint Manipulations;Taping    PT Next Visit Plan gentle exercise for strength, endurance and flexibility as pt tolerates    PT Home Exercise Plan Access Code: [email or web address]    Consulted and Agree with Plan of Care Patient           Patient will benefit from skilled therapeutic intervention in order to improve the following deficits and impairments:  Abnormal gait, Decreased activity tolerance, Postural dysfunction, Decreased strength, Improper body mechanics, Impaired flexibility, Decreased balance, Pain, Increased muscle spasms, Decreased endurance, Decreased range of motion  Visit Diagnosis: Chronic bilateral low back pain with right-sided sciatica  Muscle weakness (generalized)     Problem List Patient Active Problem List   Diagnosis Date Noted  . Right hip pain 01/26/2020  . Abscess 01/26/2020  . Spinal stenosis of lumbar region 01/16/2020  . Chronic lumbar radiculopathy 12/08/2019  .  Hyperlipidemia associated with type 2  diabetes mellitus ([place]) 12/08/2019  . Dizziness 11/03/2019  . Paronychia of left index finger 05/19/2019  . Fall at home 11/24/2018  . Rosacea 10/24/2018  . Loss of weight 09/09/2018  . Intertrigo 04/08/2018  . Schizoaffective disorder, depressive type ([place]) 04/08/2018  . Cough 10/20/2017  . Left foot pain 04/16/2017  . Contact dermatitis 04/16/2017  . Varicose veins of bilateral lower extremities with other complications 24/05/4642  . Erectile dysfunction 04/26/2015  . Hypothyroidism   . Seizures ([place])   . Depression   . Persistent atrial fibrillation ([place])   . Obesity, Class III, BMI 40-49.9 (morbid obesity) ([place])   . Seborrheic dermatitis 02/10/2007  . T2DM (type 2 diabetes mellitus) ([place]) 02/10/2007    Sigurd Sos, PT 05/22/20 1:24 PM PHYSICAL THERAPY DISCHARGE SUMMARY  Visits from Start of Care: 11  Current functional level related to goals / functional outcomes: See above for most current PT status.  Pt didn't return after 05/12/20.    Remaining deficits: See above for most current PT status.    Education / Equipment: HEP, posture/mechanics Plan: Patient agrees to discharge.  Patient goals were partially met. Patient is being discharged due to not returning since the last visit.  ?????    Sigurd Sos, PT 08/30/20 9:30 AM  [facility] [address] [place], [place], [address] Phone: [phone]   Fax:  [phone]  Name: [name] MRN: [id number] Date of Birth: Oct 06, 1960

## 2020-06-12 ENCOUNTER — Telehealth (INDEPENDENT_AMBULATORY_CARE_PROVIDER_SITE_OTHER): Payer: Medicare Other | Admitting: Psychiatry

## 2020-06-12 ENCOUNTER — Other Ambulatory Visit: Payer: Self-pay

## 2020-06-12 ENCOUNTER — Encounter (HOSPITAL_COMMUNITY): Payer: Self-pay | Admitting: Psychiatry

## 2020-06-12 DIAGNOSIS — F251 Schizoaffective disorder, depressive type: Secondary | ICD-10-CM | POA: Diagnosis not present

## 2020-06-12 DIAGNOSIS — F330 Major depressive disorder, recurrent, mild: Secondary | ICD-10-CM

## 2020-06-12 DIAGNOSIS — L718 Other rosacea: Secondary | ICD-10-CM | POA: Diagnosis not present

## 2020-06-12 DIAGNOSIS — L308 Other specified dermatitis: Secondary | ICD-10-CM | POA: Diagnosis not present

## 2020-06-12 DIAGNOSIS — L218 Other seborrheic dermatitis: Secondary | ICD-10-CM | POA: Diagnosis not present

## 2020-06-12 MED ORDER — DEPAKOTE ER 250 MG PO TB24: 500.0000 mg | ORAL_TABLET | Freq: Two times a day (BID) | ORAL | 0 refills | Status: DC

## 2020-06-12 MED ORDER — PHENELZINE SULFATE 15 MG PO TABS: 45.0000 mg | ORAL_TABLET | Freq: Two times a day (BID) | ORAL | 0 refills | Status: DC

## 2020-06-12 MED ORDER — PERPHENAZINE 8 MG PO TABS: 24.0000 mg | ORAL_TABLET | Freq: Every day | ORAL | 0 refills | Status: DC

## 2020-06-12 MED ORDER — BENZTROPINE MESYLATE 1 MG PO TABS: 1.0000 mg | ORAL_TABLET | Freq: Every day | ORAL | 0 refills | Status: DC

## 2020-06-12 NOTE — Progress Notes (Signed)
Virtual Visit via Telephone Note  I connected with [name] on 06/12/20 at 10:20 AM EST by telephone and verified that I am speaking with the correct person using two identifiers.  Location: Patient: Home Provider: Home Office   I discussed the limitations, risks, security and privacy concerns of performing an evaluation and management service by telephone and the availability of in person appointments. I also discussed with the patient that there may be a patient responsible charge related to this service. The patient expressed understanding and agreed to proceed.   History of Present Illness: Patient is evaluated by phone session.  He is on the phone by himself.  He is taking his medication as prescribed.  So far he has no issue getting medication from the pharmacy.  He is trying to help his mother and does visit her more frequently.  Recently he has diagnosed with rosacea and seeing dermatologist.  He admitted increased appetite and possible weight gain because he is eating snacks.  He promised to start dieting after the holidays. He is looking forward for Christmas as he already purchased the gifts.  He will spend Christmas with his mother and his brother were also  joined them.  Denies any paranoia, hallucination, anger or any irritability.  He has no tremors shakes or any EPS.  He is aware about his dietary restriction since he is taking monoamide oxidase inhibitors.  He lives by himself.  His mother lives in [place].  Past Psychiatric History:Reviewed. H/Omultipleinpatientfrom 1979-1946forsevere depression, suicidal thoughts. Noh/osuicidal attempt.H/OOCD, schizoaffective disorder and major depression. Tried numerous medication including TCAs. We tried Lamictal but he develop a rash.   Psychiatric Specialty Exam: Physical Exam  Review of Systems  Weight 263 lb (119.3 kg).There is no height or weight on file to calculate BMI.  General Appearance: NA  Eye Contact:  NA   Speech:  Slow  Volume:  Decreased  Mood:  Euthymic  Affect:  NA  Thought Process:  Descriptions of Associations: Intact  Orientation:  Full (Time, Place, and Person)  Thought Content:  WDL  Suicidal Thoughts:  No  Homicidal Thoughts:  No  Memory:  Immediate;   Fair Recent;   Fair Remote;   Fair  Judgement:  Good  Insight:  Present  Psychomotor Activity:  NA  Concentration:  Concentration: Fair and Attention Span: Fair  Recall:  Good  Fund of Knowledge:  Good  Language:  Good  Akathisia:  No  Handed:  Right  AIMS (if indicated):     Assets:  Communication Skills Desire for Improvement Housing Resilience  ADL's:  Intact  Cognition:  WNL  Sleep:   ok      Assessment and Plan: Schizoaffective disorder, depressed type.  Major depressive disorder, recurrent.   Patient is a stable on his current medication.  Discussed watching his diet and exercise.  His last hemoglobin A1c is 7.2 which was done in July.  We will do Depakote level on his next appointment.  Patient does not want to change the medication.  Continue Depakote 250 mg 2 tablet in the morning and 2 tablet at bedtime, Cogentin 1 mg at bedtime, Trilafon 24 mg at bedtime and Nardil 25 mg twice a day.  Discussed medication side effects and benefits.  Recommended to call us back if he has any question or any concern.  Follow-up in 3 months.  Follow Up Instructions:    I discussed the assessment and treatment plan with the patient. The patient was provided an opportunity  to ask questions and all were answered. The patient agreed with the plan and demonstrated an understanding of the instructions.   The patient was advised to call back or seek an in-person evaluation if the symptoms worsen or if the condition fails to improve as anticipated.  I provided 15 minutes of non-face-to-face time during this encounter.   Kathlee Nations, MD

## 2020-06-19 ENCOUNTER — Ambulatory Visit: Payer: Medicare Other | Admitting: Physical Therapy

## 2020-06-24 ENCOUNTER — Encounter: Payer: Medicare Other | Admitting: Physical Therapy

## 2020-06-27 DIAGNOSIS — Z23 Encounter for immunization: Secondary | ICD-10-CM | POA: Diagnosis not present

## 2020-07-03 ENCOUNTER — Encounter: Payer: Medicare Other | Admitting: Physical Therapy

## 2020-07-23 ENCOUNTER — Ambulatory Visit (INDEPENDENT_AMBULATORY_CARE_PROVIDER_SITE_OTHER): Payer: Medicare Other | Admitting: Family Medicine

## 2020-07-23 ENCOUNTER — Other Ambulatory Visit: Payer: Self-pay

## 2020-07-23 VITALS — BP 138/92 | HR 70 | Wt 259.0 lb

## 2020-07-23 DIAGNOSIS — E119 Type 2 diabetes mellitus without complications: Secondary | ICD-10-CM

## 2020-07-23 DIAGNOSIS — M545 Low back pain, unspecified: Secondary | ICD-10-CM

## 2020-07-23 DIAGNOSIS — Z1211 Encounter for screening for malignant neoplasm of colon: Secondary | ICD-10-CM

## 2020-07-23 DIAGNOSIS — M25562 Pain in left knee: Secondary | ICD-10-CM | POA: Diagnosis not present

## 2020-07-23 LAB — POCT GLYCOSYLATED HEMOGLOBIN (HGB A1C): Hemoglobin A1C: 6.8 % — AB (ref 4.0–5.6)

## 2020-07-23 NOTE — Progress Notes (Signed)
    SUBJECTIVE:   CHIEF COMPLAINT / HPI:   Low back pain, acute [name] noticed that he for started having a pinching back pain about 4 days ago.  He notices this most when rolling around in bed but he also has some discomfort with walking.  He has not noted any movement or radiation of this back pain.  He specifically denies stabbing/shooting pains down his back or legs.  He does not recall any recent trauma or injury.  He does have a history of recent falls but none recent.  He reports that his last fall was about 1 month ago.  He specifically denies fecal or urinary incontinence.  He denies any pelvic numbness.  Left knee pain He notes some mild, achy left knee pain.  This is quite minor.  His primary concern today is his back pain.  He notes that he did have some falls over 1 month ago and landed on his right knee which is not currently bothering him.  There are no specific activities that bother his knee.  PERTINENT  PMH / PSH: Lumbar spinal stenosis  OBJECTIVE:   BP (!) 138/92   Pulse 70   Wt 259 lb (117.5 kg)   SpO2 96%   BMI 36.64 kg/m    General: Alert and cooperative and appears to be in no acute distress Respiratory: Breathing comfortably on room air.  No respiratory distress Back: No tenderness with percussion of the thoracic or lumbar spine.  No paraspinal muscle tenderness.  Minimal tenderness of the right SI joint.  No tenderness of the left SI joint with palpation.  Straight leg raise negative.  FABER and FADIR testing negative.  No tenderness of the greater trochanters bilaterally. Left knee Inspection: No gross abnormality or deformity Palpation: No tenderness with palpation of the tibial plateau or with movement of the patella. ROM: Full passive range of motion Neurovascularly intact Negative Thessaly test  ASSESSMENT/PLAN:   Acute right-sided low back pain This appears to be more acute, MSK etiology.  He does have a history of lumbar spinal stenosis.  This  does not appear to be an exacerbation of his stenosis.  He does note that he does feel more comfortable walking in a hunched over position with a grocery cart but that seems separate and apart from his current, acute issue.  Based on his age, I will get lumbar imaging for this new low back pain to ensure no compression fracture. -Tylenol for pain, no NSAIDs due to Eliquis -Currently seeing physical therapy, continue to see physical therapy -Return to clinic in 1 month for reevaluation  Left knee pain He mentioned his knee pain at the beginning of the visit although it seemed to be very minor compared to his back pain.  His main concern was evaluation of his back pain.  The evaluation of his knee was entirely benign.  I do not think any imaging is warranted for now. -Tylenol as needed   Health maintenance -Placed referral for colonoscopy -Follow-up A1c  Matilde Haymaker, MD [place]

## 2020-07-23 NOTE — Assessment & Plan Note (Signed)
He mentioned his knee pain at the beginning of the visit although it seemed to be very minor compared to his back pain.  His main concern was evaluation of his back pain.  The evaluation of his knee was entirely benign.  I do not think any imaging is warranted for now. -Tylenol as needed

## 2020-07-23 NOTE — Patient Instructions (Signed)
It was great to see you today.  Here is a quick review of the things we talked about:  Low back pain: I think this low back pain is most likely a small muscle strain in her back that will get better with time.  We will get an x-ray of your lower back just to make sure there is no bony problem.  I recommend that you use Tylenol for pain control.  Please come back to clinic if you have not noticed any improvement in the next 3-4 weeks.  Knee pain: Your knee pain seems to be very mild today.  Do not think that there is anything we need to do for further work-up or evaluation of this pain.

## 2020-07-23 NOTE — Assessment & Plan Note (Signed)
This appears to be more acute, MSK etiology.  He does have a history of lumbar spinal stenosis.  This does not appear to be an exacerbation of his stenosis.  He does note that he does feel more comfortable walking in a hunched over position with a grocery cart but that seems separate and apart from his current, acute issue.  Based on his age, I will get lumbar imaging for this new low back pain to ensure no compression fracture. -Tylenol for pain, no NSAIDs due to Eliquis -Currently seeing physical therapy, continue to see physical therapy -Return to clinic in 1 month for reevaluation

## 2020-07-25 ENCOUNTER — Telehealth (HOSPITAL_COMMUNITY): Payer: Self-pay | Admitting: *Deleted

## 2020-07-25 NOTE — Telephone Encounter (Signed)
Patient called and stated he had been having increased OCD symptoms.  He had recently been on an antibiotic that he thought may have interfed with his psych meds and caused the issues.  He stopped antibiotic when he finished the course and said his OCD had improved some.  Patient to call us if he is not back to base level in a week.  FYI

## 2020-08-06 ENCOUNTER — Other Ambulatory Visit: Payer: Self-pay | Admitting: Family Medicine

## 2020-08-06 ENCOUNTER — Telehealth: Payer: Self-pay | Admitting: Radiology

## 2020-08-06 DIAGNOSIS — M25562 Pain in left knee: Secondary | ICD-10-CM

## 2020-08-06 DIAGNOSIS — G8929 Other chronic pain: Secondary | ICD-10-CM

## 2020-08-06 DIAGNOSIS — M5416 Radiculopathy, lumbar region: Secondary | ICD-10-CM

## 2020-08-06 NOTE — Telephone Encounter (Signed)
  Referral for PT entered.     Marybelle Killings, MD  Carmine Savoy, RT [place] send PT referral thanks        Previous Messages   ----- Message -----  From: Wendall Papa, [place]  Sent: 08/06/2020 10:42 AM EST  To: Marybelle Killings, MD  Subject: Patient referral                 Hi Dr. Lorin Mercy,   This patient came in into the clinic this morning, asking to return to physical therapy. We do not have a current referral on file for his back and his knees.   We have scheduled him for 09/12/20 and need a new referral for him to be seen. Could you help assist me with that?   I informed the patient to call your office, and inform them he wanted to continue therapy just in case he needed to schedule an appointment to come see you for an update.   Regards,  Caryl Pina

## 2020-08-20 ENCOUNTER — Other Ambulatory Visit: Payer: Self-pay

## 2020-08-20 MED ORDER — LEVOTHYROXINE SODIUM 100 MCG PO TABS
100.0000 ug | ORAL_TABLET | Freq: Every day | ORAL | 3 refills | Status: DC
Start: 2020-08-20 — End: 2021-08-01

## 2020-09-03 ENCOUNTER — Ambulatory Visit: Payer: Medicare Other | Admitting: Physical Therapy

## 2020-09-04 ENCOUNTER — Ambulatory Visit: Payer: Medicare Other | Attending: Orthopaedic Surgery | Admitting: Physical Therapy

## 2020-09-04 ENCOUNTER — Other Ambulatory Visit: Payer: Self-pay

## 2020-09-04 ENCOUNTER — Encounter: Payer: Self-pay | Admitting: Physical Therapy

## 2020-09-04 DIAGNOSIS — M25561 Pain in right knee: Secondary | ICD-10-CM | POA: Diagnosis present

## 2020-09-04 DIAGNOSIS — G8929 Other chronic pain: Secondary | ICD-10-CM | POA: Insufficient documentation

## 2020-09-04 DIAGNOSIS — M6281 Muscle weakness (generalized): Secondary | ICD-10-CM | POA: Insufficient documentation

## 2020-09-04 DIAGNOSIS — M25562 Pain in left knee: Secondary | ICD-10-CM | POA: Insufficient documentation

## 2020-09-04 DIAGNOSIS — M5441 Lumbago with sciatica, right side: Secondary | ICD-10-CM | POA: Insufficient documentation

## 2020-09-04 DIAGNOSIS — R2689 Other abnormalities of gait and mobility: Secondary | ICD-10-CM | POA: Insufficient documentation

## 2020-09-04 NOTE — Patient Instructions (Signed)
Access Code: [email or web address] URL: [email or web address] Date: 09/04/2020 Prepared by: Venetia Night Jovaughn Wojtaszek  Exercises Supine Lower Trunk Rotation - 3 x daily - 7 x weekly - 3 reps - 1 sets - 20 hold Seated Hamstring Stretch - 3 x daily - 7 x weekly - 3 reps - 1 sets - 20 hold Sit to Stand - 2 x daily - 7 x weekly - 1 sets - 10 reps Seated Long Arc Quad - 3 x daily - 7 x weekly - 2 sets - 10 reps Seated Heel Toe Raises - 1 x daily - 7 x weekly - 3 sets - 10 reps

## 2020-09-04 NOTE — Therapy (Signed)
[facility] [address], [place], [place], [address] Phone: [phone]   Fax:  [phone]  Physical Therapy Evaluation  Patient Details  Name: [name] MRN: [id number] Date of Birth: 1960/12/05 Referring Provider (PT): Marybelle Killings, MD   Encounter Date: 09/04/2020   PT End of Session - 09/04/20 1327    Visit Number 1    Date for PT Re-Evaluation 11/27/20    Authorization Type Medicare Part A and B    Progress Note Due on Visit 10    PT Start Time 1232    PT Stop Time 1315    PT Time Calculation (min) 43 min    Activity Tolerance Patient tolerated treatment well    Behavior During Therapy [facility] for tasks assessed/performed           Past Medical History:  Diagnosis Date  . A-fib ([place])   . Arrhythmia   . Chronic a-fib ([place])   . Closed fracture of left distal radius   . Depression   . Depression   . Hypothyroidism   . Low back pain   . Morbid obesity, BMI unknown ([place])   . Osteoarthritis    oa in bilateral knees  . Schizophrenia ([place])   . Seizures ([place])   . Thyroid disease   . Varicose veins     Past Surgical History:  Procedure Laterality Date  . ACHILLES TENDON REPAIR  approx. 2004   right foot  . CATARACT EXTRACTION     bilateral  . OPEN REDUCTION INTERNAL FIXATION (ORIF) DISTAL RADIAL FRACTURE Left 07/21/2018   Procedure: OPEN REDUCTION INTERNAL FIXATION (ORIF) DISTAL RADIAL FRACTURE;  Surgeon: [name], MD;  Location: [place];  Service: Orthopedics;  Laterality: Left;  . stab phlebectomy  Right 11/09/2016   stab phlebectomy R leg by Tinnie Gens MD  . TOOTH EXTRACTION      There were no vitals filed for this visit.    Subjective Assessment - 09/04/20 1235    Subjective Pt is a returning Pt with ongoing chronic LBP and bil leg pain.  He reports pain is worse than when he was last attending PT.  Pt reports he is very weak and out of shape.  "My feet sometimes  feel like they are crumbling."  I may join a gym [employer]) but I am trying not to do too much impact.  I liked the NuStep when I was here last time.    Pertinent History PMH: depression, spinal stenosis L2/3, schizophrenia, history of falls    Limitations Standing;Walking;Other (comment)   steps   How long can you stand comfortably? 30 min    How long can you walk comfortably? 30 min    Diagnostic tests M54.16 (ICD-10-CM) - Chronic lumbar radiculopathy  M25.561,M25.562,G89.29 (ICD-10-CM) - Chronic pain of both knees    Patient Stated Goals decrease back pain with bed mobility, balance/strength, stairs    Currently in Pain? Yes    Pain Score 7    gets up to a 9/10, morning is 5/10   Pain Location Back    Pain Orientation Left;Right;Lower    Pain Descriptors / Indicators Sharp    Pain Type Chronic pain    Pain Radiating Towards both legs, hips, knees, feet    Pain Onset More than a month ago    Pain Frequency Constant   varied in intensity   Aggravating Factors  rolling over from side to side in bed, prolonged standing  and walking > 30'    Pain Relieving Factors sitting              [facility] PT Assessment - 09/04/20 0001      Assessment   Medical Diagnosis M54.16 (ICD-10-CM) - Chronic lumbar radiculopathy  M25.561,M25.562,G89.29 (ICD-10-CM) - Chronic pain of both knees    Referring Provider (PT) Marybelle Killings, MD    Onset Date/Surgical Date --   years   Hand Dominance Right    Next MD Visit needs to schedule    Prior Therapy yes at this facility      Precautions   Precautions Fall      Balance Screen   Has the patient fallen in the past 6 months Yes    How many times? 6   trips and falls forward   Has the patient had a decrease in activity level because of a fear of falling?  Yes    Is the patient reluctant to leave their home because of a fear of falling?  No      Home Environment   Living Environment Private residence    Living Arrangements Alone    Type of [place] to enter    Entrance Stairs-Number of Steps 13    Entrance Stairs-Rails Left    [place] One level      Prior Function   Level of [place] On disability    Leisure goes to hockey games      Observation/Other Assessments   Focus on Therapeutic Outcomes (FOTO)  next visit, ran out of time      Functional Tests   Functional tests Sit to Stand      Sit to Stand   Comments able to perform without UE assist and improved controlled descent from last PT episode of care      ROM / Strength   AROM / PROM / Strength AROM;PROM;Strength      AROM   Overall AROM Comments trunk ROM limited flexion 50%, bil SB 5 deg with pain, knee ROM WFL without pain on A/ROM assessment      PROM   Overall PROM Comments hips limited bil 50%      Strength   Overall Strength Comments LEs 4/5 thorughout      Flexibility   Soft Tissue Assessment /Muscle Length yes    Hamstrings limited 50% bil      Palpation   Palpation comment tender bil lumbar paraspinals, bil SI joints, bil gluts      Bed Mobility   Bed Mobility Rolling Right;Rolling Left;Right Sidelying to Sit;Sit to Sidelying Right   slow, uses headboard and nightstand to pull to assist bed mobility     Ambulation/Gait   Assistive device None    Gait Pattern Step-through pattern;Decreased step length - left;Decreased step length - right;Lateral hip instability;Decreased trunk rotation    Stairs Yes    Stairs Assistance 6: Modified independent (Device/Increase time)    Stair Management Technique One rail Left;Step to pattern;Alternating pattern   alt going up, step to coming down (Rt down first)   Number of Stairs 4    Height of Stairs 6      Standardized Balance Assessment   Standardized Balance Assessment Five Times Sit to Stand;Timed Up and Go Test    Five times sit to stand comments  21 sec no UEs some momentum      Timed Up and Go Test  TUG Normal TUG    Normal TUG  (seconds) 16                      Objective measurements completed on examination: See above findings.       [facility] Adult PT Treatment/Exercise - 09/04/20 0001      Self-Care   Self-Care Other Self-Care Comments    Other Self-Care Comments  bed mobility review, pillow use between knees in SL for sleep and rolling in bed, review of HEP from previous episode to reinstate, added seated heel/toe raises                  PT Education - 09/04/20 1313    Education Details Access Code: [email or web address]    Person(s) Educated Patient    Methods Explanation;Demonstration;Handout    Comprehension Verbalized understanding;Returned demonstration            PT Short Term Goals - 09/04/20 1339      PT SHORT TERM GOAL #1   Title be independent in initial HEP    Time 4    Period Weeks    Status New    Target Date 10/02/20      PT SHORT TERM GOAL #2   Title improve strength to perform 5x sit to stand in < or = to 18 seconds to improve safety    Time 4    Period Weeks    Status New    Target Date 10/02/20      PT SHORT TERM GOAL #3   Title Improve TUG test to 13 sec or less for improved safety and reduced fall risk    Baseline 16 sec    Time 4    Period Weeks    Status New    Target Date 10/16/20             PT Long Term Goals - 09/04/20 1340      PT LONG TERM GOAL #1   Title be independent in advanced HEP    Time 10    Period Weeks    Status New    Target Date 11/13/20      PT LONG TERM GOAL #2   Title Pt will be able to perform multi-directional stepping and reaching strategies without LOB    Baseline -    Time 12    Period Weeks    Status New    Target Date 11/27/20      PT LONG TERM GOAL #3   Title improve LE strength and endurance to walk for 30 minutes with min symptom aggravation to improve endurance in the community    Baseline 30 min but with signif pain and fatigue      PT LONG TERM GOAL #4   Title improve 5x sit to stand to < or = to 16  seconds to reduce falls risk    Baseline 21    Time 12    Period Weeks    Status New    Target Date 11/27/20      PT LONG TERM GOAL #5   Title Pt will demo improved bed mobility with min use of UEs to reduce pain with rolling Lt/Rt in bed at night.    Baseline pulls on headboard and nightstand, signif pain    Time 12    Period Weeks    Status New    Target Date 11/27/20  Plan - 09/04/20 1328    Clinical Impression Statement Pt is a pleasant 59yo previous patient who returns with ongoing chronic LBP and bil LE pain and weakness.  He has signif history of falls x 6 over past 6 months.  He has been diagnosed with L2/3 spinal stenosis and OA of lumbar spine and Lt knee.  He has bil hip, knee and foot pain.  Pt is on disability.  He has slow, short stride gait without use of AD.  He depends on railings for stairs and bed headboard/furniture for bed mobility which is very painful.  He demo'd improved functional strength maintained from last PT episode with ability to perform sit to stand and stand to sit with good control and without use of UEs.  Pt has 4/5 LE strength, poor core strength, and signif limited trunk and hip mobility with pain.  Bil knee ROM is WFL without pain on motion assessment.  Pt is able to sit for relief; standing and walking is limited to 30'.  Pt performs 5x sit to stand in 21 sec and TUG in 16 sec.  PT reviewed bed mobility and reinstated initial HEP from last round of PT today with good tolerance.  Pt will benefit from strength and balance focus for improved function and safety, non-impact cardiovascular exercise, and gait/stair training.  Functional mobility including bed mobility with less dependence on pulling with UEs will be included in goals as this was noted as one of Pt's most painful activities.    Personal Factors and Comorbidities Comorbidity 1;Comorbidity 2;Comorbidity 3+;Age;Fitness    Comorbidities depression, spinal stenosis L2/3,  schizophrenia, history of falls    Examination-Activity Limitations Locomotion Level;Transfers;Bed Mobility;Bend;Stairs;Stand;Squat;Lift    Examination-Participation Restrictions Community Activity;Shop;Cleaning    Stability/Clinical Decision Making Stable/Uncomplicated    Clinical Decision Making Low    Rehab Potential Good    PT Frequency 2x / week    PT Duration 12 weeks    PT Treatment/Interventions ADLs/Self Care Home Management;Aquatic Therapy;Cryotherapy;Electrical Stimulation;Moist Heat;Iontophoresis 4mg /ml Dexamethasone;Neuromuscular re-education;Balance training;Therapeutic exercise;Therapeutic activities;Functional mobility training;Stair training;Gait training;Patient/family education;Manual techniques;Passive range of motion;Dry needling;Joint Manipulations;Spinal Manipulations    PT Next Visit Plan NuStep, review HEP, work on bed mobility/core for turning over in bed with pillow b/w knees, functional strength and balance    PT Home Exercise Plan Access Code: [email or web address]    Consulted and Agree with Plan of Care Patient           Patient will benefit from skilled therapeutic intervention in order to improve the following deficits and impairments:  Abnormal gait,Decreased range of motion,Difficulty walking,Obesity,Decreased endurance,Decreased activity tolerance,Pain,Decreased balance,Impaired flexibility,Improper body mechanics,Decreased mobility,Decreased strength  Visit Diagnosis: Chronic bilateral low back pain with right-sided sciatica - Plan: PT plan of care cert/re-cert  Muscle weakness (generalized) - Plan: PT plan of care cert/re-cert  Other abnormalities of gait and mobility - Plan: PT plan of care cert/re-cert  Chronic pain of left knee - Plan: PT plan of care cert/re-cert  Chronic pain of right knee - Plan: PT plan of care cert/re-cert     Problem List Patient Active Problem List   Diagnosis Date Noted  . Left knee pain 07/23/2020  . Spinal stenosis of lumbar  region 01/16/2020  . Chronic lumbar radiculopathy 12/08/2019  . Hyperlipidemia associated with type 2 diabetes mellitus ([place]) 12/08/2019  . Dizziness 11/03/2019  . Paronychia of left index finger 05/19/2019  . Fall at home 11/24/2018  . Rosacea 10/24/2018  . Loss of weight 09/09/2018  . Schizoaffective disorder, depressive  type ([place]) 04/08/2018  . Cough 10/20/2017  . Contact dermatitis 04/16/2017  . Varicose veins of bilateral lower extremities with other complications 61/16/4353  . Erectile dysfunction 04/26/2015  . Hypothyroidism   . Seizures ([place])   . Persistent atrial fibrillation ([place])   . Obesity, Class III, BMI 40-49.9 (morbid obesity) ([place])   . Acute right-sided low back pain   . Seborrheic dermatitis 02/10/2007  . T2DM (type 2 diabetes mellitus) ([place]) 02/10/2007    [name] 09/04/2020, 1:45 PM  [facility] [address], [place], [place], [address] Phone: [phone]   Fax:  [phone]  Name: [name] MRN: [id number] Date of Birth: 03/31/1961

## 2020-09-05 ENCOUNTER — Encounter (HOSPITAL_COMMUNITY): Payer: Self-pay | Admitting: Psychiatry

## 2020-09-05 ENCOUNTER — Telehealth (INDEPENDENT_AMBULATORY_CARE_PROVIDER_SITE_OTHER): Payer: Medicare Other | Admitting: Psychiatry

## 2020-09-05 VITALS — Wt 258.0 lb

## 2020-09-05 DIAGNOSIS — F419 Anxiety disorder, unspecified: Secondary | ICD-10-CM

## 2020-09-05 DIAGNOSIS — F339 Major depressive disorder, recurrent, unspecified: Secondary | ICD-10-CM | POA: Diagnosis not present

## 2020-09-05 MED ORDER — PERPHENAZINE 8 MG PO TABS: 24.0000 mg | ORAL_TABLET | Freq: Every day | ORAL | 0 refills | Status: DC

## 2020-09-05 MED ORDER — PHENELZINE SULFATE 15 MG PO TABS: 45.0000 mg | ORAL_TABLET | Freq: Two times a day (BID) | ORAL | 0 refills | Status: DC

## 2020-09-05 MED ORDER — DEPAKOTE ER 250 MG PO TB24: 500.0000 mg | ORAL_TABLET | Freq: Two times a day (BID) | ORAL | 0 refills | Status: DC

## 2020-09-05 MED ORDER — BENZTROPINE MESYLATE 1 MG PO TABS: 1.0000 mg | ORAL_TABLET | Freq: Every day | ORAL | 0 refills | Status: DC

## 2020-09-05 NOTE — Progress Notes (Signed)
Virtual Visit via Telephone Note  I connected with [name] on 09/05/20 at 10:20 AM EST by telephone and verified that I am speaking with the correct person using two identifiers.  Location: Patient: Home Provider: Home Office   I discussed the limitations, risks, security and privacy concerns of performing an evaluation and management service by telephone and the availability of in person appointments. I also discussed with the patient that there may be a patient responsible charge related to this service. The patient expressed understanding and agreed to proceed.   History of Present Illness: Patient is evaluated by phone session.  He is stable on his medication but lately he had fell few times and now doing physical therapy twice a week.  He noticed improvement with the physical therapy and like to continue for another 12 weeks.  He had blood work and his hemoglobin A1c is slightly increased.  He is trying to lose weight and he had lost 3 pounds since the last visit.  He is also looking for a part-time job.  He reported his medicine working as he denies any crying spells, feeling of hopelessness or worthlessness.  He denies any hallucination or any paranoia.  He does help his mother and try to visit her more frequently.  He is complaining of pain in his hip, knee and feet.  He is hoping with the physical therapy he is feeling better with his pain.  He lives by himself.  His mother lives in [place].  Patient denies any agitation, anger, mania.  He is taking monoamide oxidase and he is aware about his dietary restrictions.  He does not want to change the medication.   Past Psychiatric History:Reviewed. H/Omultipleinpatientfrom 1979-1939forsevere depression, suicidal thoughts. Noh/osuicidal attempt.H/OOCD, schizoaffective disorder and major depression. Tried numerous medication including TCAs. We tried Lamictal but he develop a rash.   Recent Results (from the past 2160  hour(s))  HgB A1c     Status: Abnormal   Collection Time: 07/23/20  4:50 PM  Result Value Ref Range   Hemoglobin A1C 6.8 (A) 4.0 - 5.6 %   HbA1c POC (<> result, manual entry)     HbA1c, POC (prediabetic range)     HbA1c, POC (controlled diabetic range)      Psychiatric Specialty Exam: Physical Exam  Review of Systems  Weight 258 lb (117 kg).There is no height or weight on file to calculate BMI.  General Appearance: NA  Eye Contact:  NA  Speech:  Slow  Volume:  Decreased  Mood:  Euthymic  Affect:  NA  Thought Process:  Goal Directed  Orientation:  Full (Time, Place, and Person)  Thought Content:  WDL  Suicidal Thoughts:  No  Homicidal Thoughts:  No  Memory:  Immediate;   Good Recent;   Fair Remote;   Fair  Judgement:  Intact  Insight:  Present  Psychomotor Activity:  NA  Concentration:  Concentration: Fair and Attention Span: Fair  Recall:  [employer] of Knowledge:  Good  Language:  Good  Akathisia:  No  Handed:  Right  AIMS (if indicated):     Assets:  Communication Skills Desire for Improvement Housing Resilience Social Support  ADL's:  Intact  Cognition:  WNL  Sleep:   ok     Assessment and Plan: Major depressive disorder, recurrent with atypical features.  Anxiety.  Discussed his blood work and frequent falls.  Patient is feeling better since started physical therapy.  He admitted not compliant with his diet  and lately eating unhealthy food but now focus on his weight loss.  He has appointment with his PCP in 1 month.  We talked about Depakote level and he is agreed to have a blood work with his PCP on his next visit.  I will send my note to his PCP Dr. Matilde Haymaker for the follow-up work.  He will require Depakote level.  He does not want to change the medication.  He has no tremors issues or any concern.  Continue Trilafon 24 mg at bedtime, Nardil 25 mg twice a day, Depakote 250 mg 2 tablet in the morning and 2000 at bedtime and Cogentin 1 mg at bedtime.   Discussed medication side effects and benefits specially monoamide oxidase dietary restrictions.  Recommended to call us back if is any question or any concern.  Follow-up in 3 months.  Follow Up Instructions:    I discussed the assessment and treatment plan with the patient. The patient was provided an opportunity to ask questions and all were answered. The patient agreed with the plan and demonstrated an understanding of the instructions.   The patient was advised to call back or seek an in-person evaluation if the symptoms worsen or if the condition fails to improve as anticipated.  I provided 18 minutes of non-face-to-face time during this encounter.   Kathlee Nations, MD

## 2020-09-16 ENCOUNTER — Other Ambulatory Visit: Payer: Self-pay

## 2020-09-16 ENCOUNTER — Ambulatory Visit: Payer: Medicare Other | Admitting: Physical Therapy

## 2020-09-16 ENCOUNTER — Encounter: Payer: Self-pay | Admitting: Physical Therapy

## 2020-09-16 DIAGNOSIS — M5441 Lumbago with sciatica, right side: Secondary | ICD-10-CM

## 2020-09-16 DIAGNOSIS — M6281 Muscle weakness (generalized): Secondary | ICD-10-CM

## 2020-09-16 DIAGNOSIS — G8929 Other chronic pain: Secondary | ICD-10-CM

## 2020-09-16 DIAGNOSIS — R2689 Other abnormalities of gait and mobility: Secondary | ICD-10-CM

## 2020-09-16 NOTE — Therapy (Signed)
[facility] [address] [place], [place], [address] Phone: [phone]   Fax:  [phone]  Physical Therapy Treatment  Patient Details  Name: [name] MRN: [id number] Date of Birth: 1961/06/27 Referring Provider (PT): Marybelle Killings, MD   Encounter Date: 09/16/2020   PT End of Session - 09/16/20 1705    Visit Number 2    Date for PT Re-Evaluation 11/27/20    Authorization Type Medicare Part A and B    Progress Note Due on Visit 10    PT Start Time 1616    PT Stop Time 1700    PT Time Calculation (min) 44 min           Past Medical History:  Diagnosis Date  . A-fib ([place])   . Arrhythmia   . Chronic a-fib ([place])   . Closed fracture of left distal radius   . Depression   . Depression   . Hypothyroidism   . Low back pain   . Morbid obesity, BMI unknown ([place])   . Osteoarthritis    oa in bilateral knees  . Schizophrenia ([place])   . Seizures ([place])   . Thyroid disease   . Varicose veins     Past Surgical History:  Procedure Laterality Date  . ACHILLES TENDON REPAIR  approx. 2004   right foot  . CATARACT EXTRACTION     bilateral  . OPEN REDUCTION INTERNAL FIXATION (ORIF) DISTAL RADIAL FRACTURE Left 07/21/2018   Procedure: OPEN REDUCTION INTERNAL FIXATION (ORIF) DISTAL RADIAL FRACTURE;  Surgeon: [name], MD;  Location: [place];  Service: Orthopedics;  Laterality: Left;  . stab phlebectomy  Right 11/09/2016   stab phlebectomy R leg by Tinnie Gens MD  . TOOTH EXTRACTION      There were no vitals filed for this visit.   Subjective Assessment - 09/16/20 1617    Subjective Patient reports increased lumbar pain this date.    Pertinent History PMH: depression, spinal stenosis L2/3, schizophrenia, history of falls    Limitations Standing;Walking;Other (comment)    How long can you stand comfortably? 30 min    How long can you walk comfortably? 30 min    Diagnostic tests M54.16  (ICD-10-CM) - Chronic lumbar radiculopathy  M25.561,M25.562,G89.29 (ICD-10-CM) - Chronic pain of both knees    Patient Stated Goals decrease back pain with bed mobility, balance/strength, stairs    Currently in Pain? Yes    Pain Score 8     Pain Location Back    Pain Orientation Right    Pain Descriptors / Indicators Stabbing                             [facility] Adult PT Treatment/Exercise - 09/16/20 0001      Lumbar Exercises: Stretches   Active Hamstring Stretch Right;Left;2 reps;20 seconds    Active Hamstring Stretch Limitations seated at edge of mat table    Lower Trunk Rotation 3 reps;30 seconds    Lower Trunk Rotation Limitations 3 x 30s bilat      Lumbar Exercises: Aerobic   Nustep lvl 1 x 5 min; seat 12; no UE      Lumbar Exercises: Standing   Heel Raises 10 reps    Heel Raises Limitations 2 hand hold; heavy UE reliance despite cuing      Lumbar Exercises: Seated   Other Seated Lumbar Exercises bal roll into lumbar flexion x10  Lumbar Exercises: Supine   Ab Set 10 reps    AB Set Limitations with bil UE ball push    Pelvic Tilt 10 reps    Pelvic Tilt Limitations max verbal, tactile, and visual cuing with patient able to properly initiate x1    Heel Slides 10 reps   bil LE   Heel Slides Limitations with slider      Manual Therapy   Manual Therapy Soft tissue mobilization    Manual therapy comments using addaday to Rt lumbar spine w/ blue attachment                  PT Education - 09/16/20 1705    Education Details Access Code: [email or web address]; education on continued focus on stretches at home due to increased pain this session    Person(s) Educated Patient    Methods Explanation    Comprehension Verbalized understanding            PT Short Term Goals - 09/04/20 1339      PT SHORT TERM GOAL #1   Title be independent in initial HEP    Time 4    Period Weeks    Status New    Target Date 10/02/20      PT SHORT TERM GOAL #2   Title  improve strength to perform 5x sit to stand in < or = to 18 seconds to improve safety    Time 4    Period Weeks    Status New    Target Date 10/02/20      PT SHORT TERM GOAL #3   Title Improve TUG test to 13 sec or less for improved safety and reduced fall risk    Baseline 16 sec    Time 4    Period Weeks    Status New    Target Date 10/16/20             PT Long Term Goals - 09/04/20 1340      PT LONG TERM GOAL #1   Title be independent in advanced HEP    Time 10    Period Weeks    Status New    Target Date 11/13/20      PT LONG TERM GOAL #2   Title Pt will be able to perform multi-directional stepping and reaching strategies without LOB    Baseline -    Time 12    Period Weeks    Status New    Target Date 11/27/20      PT LONG TERM GOAL #3   Title improve LE strength and endurance to walk for 30 minutes with min symptom aggravation to improve endurance in the community    Baseline 30 min but with signif pain and fatigue      PT LONG TERM GOAL #4   Title improve 5x sit to stand to < or = to 16 seconds to reduce falls risk    Baseline 21    Time 12    Period Weeks    Status New    Target Date 11/27/20      PT LONG TERM GOAL #5   Title Pt will demo improved bed mobility with min use of UEs to reduce pain with rolling Lt/Rt in bed at night.    Baseline pulls on headboard and nightstand, signif pain    Time 12    Period Weeks    Status New    Target Date 11/27/20  Plan - 09/16/20 1706    Clinical Impression Statement Patient reports significantly increased pain this date. Reports pain to be 8/10 currently in Rt side lumbar. Attributes this to increased activity this weekend. Patient reporting 5/10 pain following soft tissue moblization using addaday. LE strength impairments continue to persist as patient unable to complete standing heel raise without maximal UE reliance. Patient intermittenly impulsive with regards to speed of movement and  ocassionally require max cuing and re-direction to task. Reporting pain increased to 8/10 following Nustep x5 minutes. Would benefit from continued skilled intervention to address impairments for decreased pain and improved activity tolerance.    Personal Factors and Comorbidities Comorbidity 1;Comorbidity 2;Comorbidity 3+;Age;Fitness    Comorbidities depression, spinal stenosis L2/3, schizophrenia, history of falls    Examination-Activity Limitations Locomotion Level;Transfers;Bed Mobility;Bend;Stairs;Stand;Squat;Lift    Examination-Participation Restrictions Community Activity;Shop;Cleaning    Rehab Potential Good    PT Frequency 2x / week    PT Duration 12 weeks    PT Treatment/Interventions ADLs/Self Care Home Management;Aquatic Therapy;Cryotherapy;Electrical Stimulation;Moist Heat;Iontophoresis 4mg /ml Dexamethasone;Neuromuscular re-education;Balance training;Therapeutic exercise;Therapeutic activities;Functional mobility training;Stair training;Gait training;Patient/family education;Manual techniques;Passive range of motion;Dry needling;Joint Manipulations;Spinal Manipulations    PT Next Visit Plan continue to work on bed mobility and core strengthening; add functional strength and balance to patient tolerance    PT Home Exercise Plan Access Code: [email or web address]    Consulted and Agree with Plan of Care Patient           Patient will benefit from skilled therapeutic intervention in order to improve the following deficits and impairments:  Abnormal gait,Decreased range of motion,Difficulty walking,Obesity,Decreased endurance,Decreased activity tolerance,Pain,Decreased balance,Impaired flexibility,Improper body mechanics,Decreased mobility,Decreased strength  Visit Diagnosis: Chronic bilateral low back pain with right-sided sciatica  Muscle weakness (generalized)  Other abnormalities of gait and mobility  Chronic pain of left knee  Chronic pain of right knee     Problem List Patient  Active Problem List   Diagnosis Date Noted  . Left knee pain 07/23/2020  . Spinal stenosis of lumbar region 01/16/2020  . Chronic lumbar radiculopathy 12/08/2019  . Hyperlipidemia associated with type 2 diabetes mellitus ([place]) 12/08/2019  . Dizziness 11/03/2019  . Paronychia of left index finger 05/19/2019  . Fall at home 11/24/2018  . Rosacea 10/24/2018  . Loss of weight 09/09/2018  . Schizoaffective disorder, depressive type ([place]) 04/08/2018  . Cough 10/20/2017  . Contact dermatitis 04/16/2017  . Varicose veins of bilateral lower extremities with other complications 68/06/7516  . Erectile dysfunction 04/26/2015  . Hypothyroidism   . Seizures ([place])   . Persistent atrial fibrillation ([place])   . Obesity, Class III, BMI 40-49.9 (morbid obesity) ([place])   . Acute right-sided low back pain   . Seborrheic dermatitis 02/10/2007  . T2DM (type 2 diabetes mellitus) ([place]) 02/10/2007   Everardo All PT, DPT  09/16/20 5:10 PM  [facility] [address], [place], [place], [address] Phone: [phone]   Fax:  [phone]  Name: CHELSEA NUSZ MRN: [id number] Date of Birth: December 21, 1960

## 2020-09-17 ENCOUNTER — Other Ambulatory Visit: Payer: Self-pay

## 2020-09-17 ENCOUNTER — Ambulatory Visit: Payer: Medicare Other | Admitting: Physical Therapy

## 2020-09-17 ENCOUNTER — Encounter: Payer: Self-pay | Admitting: Physical Therapy

## 2020-09-17 DIAGNOSIS — G8929 Other chronic pain: Secondary | ICD-10-CM

## 2020-09-17 DIAGNOSIS — M5441 Lumbago with sciatica, right side: Secondary | ICD-10-CM | POA: Diagnosis not present

## 2020-09-17 DIAGNOSIS — R2689 Other abnormalities of gait and mobility: Secondary | ICD-10-CM

## 2020-09-17 DIAGNOSIS — M6281 Muscle weakness (generalized): Secondary | ICD-10-CM

## 2020-09-17 DIAGNOSIS — M25561 Pain in right knee: Secondary | ICD-10-CM

## 2020-09-17 NOTE — Therapy (Signed)
[facility] [address] [place], [place], [address] Phone: [phone]   Fax:  [phone]  Physical Therapy Treatment  Patient Details  Name: [name] MRN: [id number] Date of Birth: 30-May-1961 Referring Provider (PT): Marybelle Killings, MD   Encounter Date: 09/17/2020   PT End of Session - 09/17/20 1653    Visit Number 3    Date for PT Re-Evaluation 11/27/20    Authorization Type Medicare Part A and B    Progress Note Due on Visit 10    PT Start Time 2297    PT Stop Time 1700    PT Time Calculation (min) 45 min    Activity Tolerance Patient tolerated treatment well    Behavior During Therapy [facility] for tasks assessed/performed           Past Medical History:  Diagnosis Date  . A-fib ([place])   . Arrhythmia   . Chronic a-fib ([place])   . Closed fracture of left distal radius   . Depression   . Depression   . Hypothyroidism   . Low back pain   . Morbid obesity, BMI unknown ([place])   . Osteoarthritis    oa in bilateral knees  . Schizophrenia ([place])   . Seizures ([place])   . Thyroid disease   . Varicose veins     Past Surgical History:  Procedure Laterality Date  . ACHILLES TENDON REPAIR  approx. 2004   right foot  . CATARACT EXTRACTION     bilateral  . OPEN REDUCTION INTERNAL FIXATION (ORIF) DISTAL RADIAL FRACTURE Left 07/21/2018   Procedure: OPEN REDUCTION INTERNAL FIXATION (ORIF) DISTAL RADIAL FRACTURE;  Surgeon: [name], MD;  Location: [place];  Service: Orthopedics;  Laterality: Left;  . stab phlebectomy  Right 11/09/2016   stab phlebectomy R leg by Tinnie Gens MD  . TOOTH EXTRACTION      There were no vitals filed for this visit.   Subjective Assessment - 09/17/20 1626    Subjective My pain improved duirng the session until the NuStep when I feel like it tightened back up.  The ball presses may have messed me up.  It got really bad last night and was bad all day.  Right low  back.  I tried heat and recliner.    Pertinent History PMH: depression, spinal stenosis L2/3, schizophrenia, history of falls    How long can you stand comfortably? 30 min    How long can you walk comfortably? 30 min    Diagnostic tests M54.16 (ICD-10-CM) - Chronic lumbar radiculopathy  M25.561,M25.562,G89.29 (ICD-10-CM) - Chronic pain of both knees    Patient Stated Goals decrease back pain with bed mobility, balance/strength, stairs    Currently in Pain? Yes    Pain Score 9     Pain Location Back    Pain Orientation Right    Pain Descriptors / Indicators Stabbing    Pain Type Chronic pain;Acute pain    Pain Onset More than a month ago    Pain Frequency Constant    Aggravating Factors  movement    Pain Relieving Factors sitting in recliner, maybe heat              [facility] PT Assessment - 09/17/20 0001      ROM / Strength   AROM / PROM / Strength AROM      AROM   Overall AROM Comments acute Rt LBP with Rt SB and flexion, extension and Lt  SB full without pain      Palpation   Palpation comment Rt QL, Rt gluteals, Rt SI joint line, lumbar paraspinals, l-spine facets L4-S1                         [facility] Adult PT Treatment/Exercise - 09/17/20 0001      Lumbar Exercises: Sidelying   Clam 10 reps    Clam Limitations 2x5 with TA contraction, VCs with each rep by PT to indraw abdominals      Modalities   Modalities Moist Heat      Moist Heat Therapy   Number Minutes Moist Heat 5 Minutes    Moist Heat Location Lumbar Spine   Pt lying supine legs flat per his comfort level in this position end of session     Manual Therapy   Manual Therapy Soft tissue mobilization    Manual therapy comments with and without Addaday with blue attachment    Soft tissue mobilization Rt QL, glut max, glut med, piriformis, SI joint line, lumbar paraspinals                    PT Short Term Goals - 09/04/20 1339      PT SHORT TERM GOAL #1   Title be independent in initial  HEP    Time 4    Period Weeks    Status New    Target Date 10/02/20      PT SHORT TERM GOAL #2   Title improve strength to perform 5x sit to stand in < or = to 18 seconds to improve safety    Time 4    Period Weeks    Status New    Target Date 10/02/20      PT SHORT TERM GOAL #3   Title Improve TUG test to 13 sec or less for improved safety and reduced fall risk    Baseline 16 sec    Time 4    Period Weeks    Status New    Target Date 10/16/20             PT Long Term Goals - 09/04/20 1340      PT LONG TERM GOAL #1   Title be independent in advanced HEP    Time 10    Period Weeks    Status New    Target Date 11/13/20      PT LONG TERM GOAL #2   Title Pt will be able to perform multi-directional stepping and reaching strategies without LOB    Baseline -    Time 12    Period Weeks    Status New    Target Date 11/27/20      PT LONG TERM GOAL #3   Title improve LE strength and endurance to walk for 30 minutes with min symptom aggravation to improve endurance in the community    Baseline 30 min but with signif pain and fatigue      PT LONG TERM GOAL #4   Title improve 5x sit to stand to < or = to 16 seconds to reduce falls risk    Baseline 21    Time 12    Period Weeks    Status New    Target Date 11/27/20      PT LONG TERM GOAL #5   Title Pt will demo improved bed mobility with min use of UEs to reduce pain with rolling Lt/Rt in bed  at night.    Baseline pulls on headboard and nightstand, signif pain    Time 12    Period Weeks    Status New    Target Date 11/27/20                 Plan - 09/17/20 1655    Clinical Impression Statement Pt called for same-day appointment today due to high pain level.  He suspects he tightened up doing NuStep and ball presses yesterday at PT appointment.  He presented with report of 9/10 pain in Rt lumbar spine which was worse with movement into trunk flexion and Rt SB.  Pt had tenderness and tension in Rt QL and  gluteals and along lower lumbar facets and SI joint line.  PT performed STM with and without Addaday assist in SL followed by neuro re-ed for TA contractions and Rt hip clams with TA with good tolerance.  Heat used end of session.  Pt had improved pain with bed mobility from supine to sit, sit to stand and with gait end of session, rating pain 4/10.  He needed mod/max assist by PT for supine to sit.  Pt will benefit from ongoing assessment with selected PT interventions to address his pain and deficits.    PT Next Visit Plan assess pain level, manual therapy, revisit SL TA and clam, gentle mobility and strengthening as tol, Addaday to Rt hip and lumbar as needed, needs assist with bed mobility    PT Home Exercise Plan Access Code: [email or web address]    Consulted and Agree with Plan of Care Patient           Patient will benefit from skilled therapeutic intervention in order to improve the following deficits and impairments:     Visit Diagnosis: Chronic bilateral low back pain with right-sided sciatica  Muscle weakness (generalized)  Other abnormalities of gait and mobility  Chronic pain of left knee  Chronic pain of right knee     Problem List Patient Active Problem List   Diagnosis Date Noted  . Left knee pain 07/23/2020  . Spinal stenosis of lumbar region 01/16/2020  . Chronic lumbar radiculopathy 12/08/2019  . Hyperlipidemia associated with type 2 diabetes mellitus ([place]) 12/08/2019  . Dizziness 11/03/2019  . Paronychia of left index finger 05/19/2019  . Fall at home 11/24/2018  . Rosacea 10/24/2018  . Loss of weight 09/09/2018  . Schizoaffective disorder, depressive type ([place]) 04/08/2018  . Cough 10/20/2017  . Contact dermatitis 04/16/2017  . Varicose veins of bilateral lower extremities with other complications 64/40/3474  . Erectile dysfunction 04/26/2015  . Hypothyroidism   . Seizures ([place])   . Persistent atrial fibrillation ([place])   . Obesity, Class III, BMI 40-49.9 (morbid  obesity) ([place])   . Acute right-sided low back pain   . Seborrheic dermatitis 02/10/2007  . T2DM (type 2 diabetes mellitus) ([place]) 02/10/2007    Leata Dominy, PT 09/17/20 5:06 PM   [facility] [address], [place], [place], [address] Phone: [phone]   Fax:  [phone]  Name: [name] MRN: [id number] Date of Birth: 1960/08/25

## 2020-09-23 ENCOUNTER — Ambulatory Visit: Payer: Medicare Other | Admitting: Physical Therapy

## 2020-09-23 ENCOUNTER — Encounter: Payer: Self-pay | Admitting: Physical Therapy

## 2020-09-23 ENCOUNTER — Other Ambulatory Visit: Payer: Self-pay

## 2020-09-23 DIAGNOSIS — R2689 Other abnormalities of gait and mobility: Secondary | ICD-10-CM

## 2020-09-23 DIAGNOSIS — M6281 Muscle weakness (generalized): Secondary | ICD-10-CM

## 2020-09-23 DIAGNOSIS — M5441 Lumbago with sciatica, right side: Secondary | ICD-10-CM | POA: Diagnosis not present

## 2020-09-23 DIAGNOSIS — G8929 Other chronic pain: Secondary | ICD-10-CM

## 2020-09-23 NOTE — Therapy (Signed)
[facility] [address], [place], [place], [address] Phone: [phone]   Fax:  [phone]  Physical Therapy Treatment  Patient Details  Name: [name] MRN: [id number] Date of Birth: 10/18/1960 Referring Provider (PT): Marybelle Killings, MD   Encounter Date: 09/23/2020   PT End of Session - 09/23/20 1440    Visit Number 4    Date for PT Re-Evaluation 11/27/20    Authorization Type Medicare Part A and B    Progress Note Due on Visit 10    PT Start Time 1440    PT Stop Time 1528    PT Time Calculation (min) 48 min    Activity Tolerance Patient tolerated treatment well    Behavior During Therapy [facility] for tasks assessed/performed           Past Medical History:  Diagnosis Date  . A-fib ([place])   . Arrhythmia   . Chronic a-fib ([place])   . Closed fracture of left distal radius   . Depression   . Depression   . Hypothyroidism   . Low back pain   . Morbid obesity, BMI unknown ([place])   . Osteoarthritis    oa in bilateral knees  . Schizophrenia ([place])   . Seizures ([place])   . Thyroid disease   . Varicose veins     Past Surgical History:  Procedure Laterality Date  . ACHILLES TENDON REPAIR  approx. 2004   right foot  . CATARACT EXTRACTION     bilateral  . OPEN REDUCTION INTERNAL FIXATION (ORIF) DISTAL RADIAL FRACTURE Left 07/21/2018   Procedure: OPEN REDUCTION INTERNAL FIXATION (ORIF) DISTAL RADIAL FRACTURE;  Surgeon: [name], MD;  Location: [facility];  Service: Orthopedics;  Laterality: Left;  . stab phlebectomy  Right 11/09/2016   stab phlebectomy R leg by Tinnie Gens MD  . TOOTH EXTRACTION      There were no vitals filed for this visit.   Subjective Assessment - 09/23/20 1442    Subjective My back was in such bad shape last week. Laying flat on my back has been helpful.    Pertinent History PMH: depression, spinal stenosis L2/3, schizophrenia, history of falls    Diagnostic tests  M54.16 (ICD-10-CM) - Chronic lumbar radiculopathy  M25.561,M25.562,G89.29 (ICD-10-CM) - Chronic pain of both knees    Currently in Pain? Yes    Pain Score 3     Pain Location Back    Pain Orientation Lower    Pain Descriptors / Indicators Dull;Sore    Aggravating Factors  Not sure    Pain Relieving Factors Laying flat has been helpful    Multiple Pain Sites No                             [facility] Adult PT Treatment/Exercise - 09/23/20 0001      Lumbar Exercises: Stretches   Active Hamstring Stretch Right;Left;2 reps;20 seconds    Active Hamstring Stretch Limitations Attempted the strap in supine, difficult for pt to relax appropriately.    Lower Trunk Rotation --   rocking 10x, then 3x10sec bil     Lumbar Exercises: Aerobic   Nustep L1 x 10 min with PTA present to discuss pain and current status      Lumbar Exercises: Supine   Clam 10 reps    Heel Slides 10 reps   VC for core contraction   Heel Slides Limitations with slider  Bent Knee Raise 5 reps    Bent Knee Raise Limitations VC for core contraction      Knee/Hip Exercises: Seated   Sit to Sand 2 sets;5 reps;without UE support      Manual Therapy   Soft tissue mobilization Addaday asst bil glutes QL                    PT Short Term Goals - 09/23/20 1505      PT SHORT TERM GOAL #1   Title be independent in initial HEP    Time 4    Period Weeks    Status Achieved    Target Date 10/02/20             PT Long Term Goals - 09/04/20 1340      PT LONG TERM GOAL #1   Title be independent in advanced HEP    Time 10    Period Weeks    Status New    Target Date 11/13/20      PT LONG TERM GOAL #2   Title Pt will be able to perform multi-directional stepping and reaching strategies without LOB    Baseline -    Time 12    Period Weeks    Status New    Target Date 11/27/20      PT LONG TERM GOAL #3   Title improve LE strength and endurance to walk for 30 minutes with min symptom  aggravation to improve endurance in the community    Baseline 30 min but with signif pain and fatigue      PT LONG TERM GOAL #4   Title improve 5x sit to stand to < or = to 16 seconds to reduce falls risk    Baseline 21    Time 12    Period Weeks    Status New    Target Date 11/27/20      PT LONG TERM GOAL #5   Title Pt will demo improved bed mobility with min use of UEs to reduce pain with rolling Lt/Rt in bed at night.    Baseline pulls on headboard and nightstand, signif pain    Time 12    Period Weeks    Status New    Target Date 11/27/20                 Plan - 09/23/20 1440    Clinical Impression Statement Pt reports his back is feeling much better than last week. he spent a lot of time laying flat on his back and he feels this has been helpful. Pt is looking into getting a gym membership for his bday in April. Pt reports he is getting better at getting up out of a chair and could perform sit to stand with only LE as long as he sits on th eblack pad.    Personal Factors and Comorbidities Comorbidity 1;Comorbidity 2;Comorbidity 3+;Age;Fitness    Comorbidities depression, spinal stenosis L2/3, schizophrenia, history of falls    Examination-Activity Limitations Locomotion Level;Transfers;Bed Mobility;Bend;Stairs;Stand;Squat;Lift    Examination-Participation Restrictions Community Activity;Shop;Cleaning    Stability/Clinical Decision Making Stable/Uncomplicated    Rehab Potential Good    PT Frequency 2x / week    PT Duration 12 weeks    PT Next Visit Plan assess pain level, manual therapy, revisit SL TA and clam, gentle mobility and strengthening as tol, Addaday to Rt hip and lumbar as needed, needs assist with bed mobility    PT Home Exercise  Plan Access Code: [email or web address]    Consulted and Agree with Plan of Care Patient           Patient will benefit from skilled therapeutic intervention in order to improve the following deficits and impairments:  Abnormal gait,Decreased  range of motion,Difficulty walking,Obesity,Decreased endurance,Decreased activity tolerance,Pain,Decreased balance,Impaired flexibility,Improper body mechanics,Decreased mobility,Decreased strength  Visit Diagnosis: Chronic bilateral low back pain with right-sided sciatica  Muscle weakness (generalized)  Other abnormalities of gait and mobility     Problem List Patient Active Problem List   Diagnosis Date Noted  . Left knee pain 07/23/2020  . Spinal stenosis of lumbar region 01/16/2020  . Chronic lumbar radiculopathy 12/08/2019  . Hyperlipidemia associated with type 2 diabetes mellitus ([place]) 12/08/2019  . Dizziness 11/03/2019  . Paronychia of left index finger 05/19/2019  . Fall at home 11/24/2018  . Rosacea 10/24/2018  . Loss of weight 09/09/2018  . Schizoaffective disorder, depressive type ([place]) 04/08/2018  . Cough 10/20/2017  . Contact dermatitis 04/16/2017  . Varicose veins of bilateral lower extremities with other complications 83/38/2505  . Erectile dysfunction 04/26/2015  . Hypothyroidism   . Seizures ([place])   . Persistent atrial fibrillation ([place])   . Obesity, Class III, BMI 40-49.9 (morbid obesity) ([place])   . Acute right-sided low back pain   . Seborrheic dermatitis 02/10/2007  . T2DM (type 2 diabetes mellitus) ([place]) 02/10/2007    Mayzee Reichenbach, PTA 09/23/2020, 3:30 PM  [facility] [address] [place], [place], [address] Phone: [phone]   Fax:  [phone]  Name: BURLIN MCNAIR MRN: [id number] Date of Birth: 1961/05/13

## 2020-09-27 ENCOUNTER — Ambulatory Visit: Payer: Medicare Other | Admitting: Physical Therapy

## 2020-10-02 ENCOUNTER — Encounter: Payer: Medicare Other | Admitting: Physical Therapy

## 2020-10-07 ENCOUNTER — Other Ambulatory Visit: Payer: Self-pay

## 2020-10-07 ENCOUNTER — Encounter: Payer: Self-pay | Admitting: Physical Therapy

## 2020-10-07 ENCOUNTER — Ambulatory Visit: Payer: Medicare Other | Attending: Orthopaedic Surgery | Admitting: Physical Therapy

## 2020-10-07 DIAGNOSIS — G8929 Other chronic pain: Secondary | ICD-10-CM | POA: Insufficient documentation

## 2020-10-07 DIAGNOSIS — M6281 Muscle weakness (generalized): Secondary | ICD-10-CM | POA: Insufficient documentation

## 2020-10-07 DIAGNOSIS — M25561 Pain in right knee: Secondary | ICD-10-CM | POA: Diagnosis present

## 2020-10-07 DIAGNOSIS — R2689 Other abnormalities of gait and mobility: Secondary | ICD-10-CM | POA: Insufficient documentation

## 2020-10-07 DIAGNOSIS — M5441 Lumbago with sciatica, right side: Secondary | ICD-10-CM | POA: Insufficient documentation

## 2020-10-07 DIAGNOSIS — M25562 Pain in left knee: Secondary | ICD-10-CM | POA: Insufficient documentation

## 2020-10-07 NOTE — Therapy (Signed)
[facility] [address] [place], [place], [address] Phone: [phone]   Fax:  [phone]  Physical Therapy Treatment  Patient Details  Name: [name] MRN: [id number] Date of Birth: 30-Sep-1960 Referring Provider (PT): Marybelle Killings, MD   Encounter Date: 10/07/2020   PT End of Session - 10/07/20 1235    Visit Number 5    Date for PT Re-Evaluation 11/27/20    Authorization Type Medicare Part A and B    Progress Note Due on Visit 10    PT Start Time 1229    PT Stop Time 1310    PT Time Calculation (min) 41 min    Activity Tolerance Patient tolerated treatment well    Behavior During Therapy [facility] for tasks assessed/performed           Past Medical History:  Diagnosis Date  . A-fib ([place])   . Arrhythmia   . Chronic a-fib ([place])   . Closed fracture of left distal radius   . Depression   . Depression   . Hypothyroidism   . Low back pain   . Morbid obesity, BMI unknown ([place])   . Osteoarthritis    oa in bilateral knees  . Schizophrenia ([place])   . Seizures ([place])   . Thyroid disease   . Varicose veins     Past Surgical History:  Procedure Laterality Date  . ACHILLES TENDON REPAIR  approx. 2004   right foot  . CATARACT EXTRACTION     bilateral  . OPEN REDUCTION INTERNAL FIXATION (ORIF) DISTAL RADIAL FRACTURE Left 07/21/2018   Procedure: OPEN REDUCTION INTERNAL FIXATION (ORIF) DISTAL RADIAL FRACTURE;  Surgeon: [name], MD;  Location: [facility];  Service: Orthopedics;  Laterality: Left;  . stab phlebectomy  Right 11/09/2016   stab phlebectomy R leg by Tinnie Gens MD  . TOOTH EXTRACTION      There were no vitals filed for this visit.   Subjective Assessment - 10/07/20 1236    Subjective My back is doing well. I have been laying around too much.    Pertinent History PMH: depression, spinal stenosis L2/3, schizophrenia, history of falls    Diagnostic tests M54.16 (ICD-10-CM) -  Chronic lumbar radiculopathy  M25.561,M25.562,G89.29 (ICD-10-CM) - Chronic pain of both knees    Currently in Pain? Yes    Pain Score 3     Pain Location Knee    Pain Orientation Left    Pain Descriptors / Indicators Sore    Aggravating Factors  Not sure    Pain Relieving Factors Laying flat on his back    Multiple Pain Sites No              [facility] PT Assessment - 10/07/20 0001      Standardized Balance Assessment   Standardized Balance Assessment Five Times Sit to Stand;Timed Up and Go Test    Five times sit to stand comments  16 sec   No UE     Timed Up and Go Test   TUG Normal TUG    Normal TUG (seconds) 12                         [facility] Adult PT Treatment/Exercise - 10/07/20 0001      Neuro Re-ed    Neuro Re-ed Details  Tapping teh colorful circles in multi directions.      Lumbar Exercises: Stretches   Active Hamstring Stretch Left;Right;2 reps;30  seconds    Active Hamstring Stretch Limitations seated    Other Lumbar Stretch Exercise Blue ball forward flexion stretch 5x, VC to move in pain free ROM    Other Lumbar Stretch Exercise Seated side bend stretch( more AROM) 3x Bil      Lumbar Exercises: Aerobic   Nustep L2 x 10 min      Manual Therapy   Soft tissue mobilization Addaday asst bil glutes QL                    PT Short Term Goals - 10/07/20 1239      PT SHORT TERM GOAL #2   Title improve strength to perform 5x sit to stand in < or = to 18 seconds to improve safety    Time 4    Period Weeks    Status Achieved   16     PT SHORT TERM GOAL #3   Title Improve TUG test to 13 sec or less for improved safety and reduced fall risk    Period Weeks    Status Achieved   12 sec            PT Long Term Goals - 09/04/20 1340      PT LONG TERM GOAL #1   Title be independent in advanced HEP    Time 10    Period Weeks    Status New    Target Date 11/13/20      PT LONG TERM GOAL #2   Title Pt will be able to perform  multi-directional stepping and reaching strategies without LOB    Baseline -    Time 12    Period Weeks    Status New    Target Date 11/27/20      PT LONG TERM GOAL #3   Title improve LE strength and endurance to walk for 30 minutes with min symptom aggravation to improve endurance in the community    Baseline 30 min but with signif pain and fatigue      PT LONG TERM GOAL #4   Title improve 5x sit to stand to < or = to 16 seconds to reduce falls risk    Baseline 21    Time 12    Period Weeks    Status New    Target Date 11/27/20      PT LONG TERM GOAL #5   Title Pt will demo improved bed mobility with min use of UEs to reduce pain with rolling Lt/Rt in bed at night.    Baseline pulls on headboard and nightstand, signif pain    Time 12    Period Weeks    Status New    Target Date 11/27/20                 Plan - 10/07/20 1235    Clinical Impression Statement All short term goals met today includind 5x sit to stand 16 sec, and TUG 16 sec. Pt noncompliant with HEp lately. Pt tolerated his stretches ok, he was hesitant to do them, he agreed to " do them easy." This went well. PTA encouraged pt to be more compliant with his HEP and lay in bed less.    Personal Factors and Comorbidities Comorbidity 1;Comorbidity 2;Comorbidity 3+;Age;Fitness    Comorbidities depression, spinal stenosis L2/3, schizophrenia, history of falls    Examination-Activity Limitations Locomotion Level;Transfers;Bed Mobility;Bend;Stairs;Stand;Squat;Lift    Examination-Participation Restrictions Community Activity;Shop;Cleaning    Stability/Clinical Decision Making Stable/Uncomplicated  Rehab Potential Good    PT Frequency 2x / week    PT Duration 12 weeks    PT Treatment/Interventions ADLs/Self Care Home Management;Aquatic Therapy;Cryotherapy;Electrical Stimulation;Moist Heat;Iontophoresis 24m/ml Dexamethasone;Neuromuscular re-education;Balance training;Therapeutic exercise;Therapeutic  activities;Functional mobility training;Stair training;Gait training;Patient/family education;Manual techniques;Passive range of motion;Dry needling;Joint Manipulations;Spinal Manipulations    PT Next Visit Plan assess pain level, manual therapy, revisit SL TA and clam, gentle mobility and strengthening as tol, Addaday to Rt hip and lumbar as needed, needs assist with bed mobility    PT Home Exercise Plan Access Code: [email or web address]   Consulted and Agree with Plan of Care Patient           Patient will benefit from skilled therapeutic intervention in order to improve the following deficits and impairments:  Abnormal gait,Decreased range of motion,Difficulty walking,Obesity,Decreased endurance,Decreased activity tolerance,Pain,Decreased balance,Impaired flexibility,Improper body mechanics,Decreased mobility,Decreased strength  Visit Diagnosis: Chronic bilateral low back pain with right-sided sciatica  Muscle weakness (generalized)  Other abnormalities of gait and mobility  Chronic pain of left knee  Chronic pain of right knee     Problem List Patient Active Problem List   Diagnosis Date Noted  . Left knee pain 07/23/2020  . Spinal stenosis of lumbar region 01/16/2020  . Chronic lumbar radiculopathy 12/08/2019  . Hyperlipidemia associated with type 2 diabetes mellitus ([place] 12/08/2019  . Dizziness 11/03/2019  . Paronychia of left index finger 05/19/2019  . Fall at home 11/24/2018  . Rosacea 10/24/2018  . Loss of weight 09/09/2018  . Schizoaffective disorder, depressive type ([place] 04/08/2018  . Cough 10/20/2017  . Contact dermatitis 04/16/2017  . Varicose veins of bilateral lower extremities with other complications 033/29/5188 . Erectile dysfunction 04/26/2015  . Hypothyroidism   . Seizures ([place]   . Persistent atrial fibrillation ([place]   . Obesity, Class III, BMI 40-49.9 (morbid obesity) ([place]   . Acute right-sided low back pain   . Seborrheic dermatitis 02/10/2007  . T2DM  (type 2 diabetes mellitus) ([place] 02/10/2007    Murat Rideout, PTA 10/07/2020, 1:13 PM  [facility] [address] [place] [address]: [phone]  Fax:  [phone] Name: Anthony BLOWEMRN: [id number] of Birth: 4March 01, 1962

## 2020-10-11 ENCOUNTER — Other Ambulatory Visit: Payer: Self-pay

## 2020-10-11 ENCOUNTER — Ambulatory Visit: Payer: Medicare Other | Admitting: Physical Therapy

## 2020-10-11 ENCOUNTER — Encounter: Payer: Self-pay | Admitting: Physical Therapy

## 2020-10-11 DIAGNOSIS — M5441 Lumbago with sciatica, right side: Secondary | ICD-10-CM | POA: Diagnosis not present

## 2020-10-11 DIAGNOSIS — G8929 Other chronic pain: Secondary | ICD-10-CM

## 2020-10-11 DIAGNOSIS — M25562 Pain in left knee: Secondary | ICD-10-CM

## 2020-10-11 DIAGNOSIS — M6281 Muscle weakness (generalized): Secondary | ICD-10-CM

## 2020-10-11 DIAGNOSIS — R2689 Other abnormalities of gait and mobility: Secondary | ICD-10-CM

## 2020-10-11 NOTE — Therapy (Signed)
[facility] [address] [place], [place], [address] Phone: [phone]   Fax:  [phone]  Physical Therapy Treatment  Patient Details  Name: [name] MRN: [id number] Date of Birth: May 26, 1961 Referring Provider (PT): Marybelle Killings, MD   Encounter Date: 10/11/2020   PT End of Session - 10/11/20 1110    Visit Number 6    Date for PT Re-Evaluation 11/27/20    Authorization Type Medicare Part A and B - KX at visit 15    Progress Note Due on Visit 10    PT Start Time 1102    PT Stop Time 1145    PT Time Calculation (min) 43 min    Activity Tolerance Patient tolerated treatment well    Behavior During Therapy [facility] for tasks assessed/performed           Past Medical History:  Diagnosis Date  . A-fib ([place])   . Arrhythmia   . Chronic a-fib ([place])   . Closed fracture of left distal radius   . Depression   . Depression   . Hypothyroidism   . Low back pain   . Morbid obesity, BMI unknown ([place])   . Osteoarthritis    oa in bilateral knees  . Schizophrenia ([place])   . Seizures ([place])   . Thyroid disease   . Varicose veins     Past Surgical History:  Procedure Laterality Date  . ACHILLES TENDON REPAIR  approx. 2004   right foot  . CATARACT EXTRACTION     bilateral  . OPEN REDUCTION INTERNAL FIXATION (ORIF) DISTAL RADIAL FRACTURE Left 07/21/2018   Procedure: OPEN REDUCTION INTERNAL FIXATION (ORIF) DISTAL RADIAL FRACTURE;  Surgeon: [name], MD;  Location: [place];  Service: Orthopedics;  Laterality: Left;  . stab phlebectomy  Right 11/09/2016   stab phlebectomy R leg by Tinnie Gens MD  . TOOTH EXTRACTION      There were no vitals filed for this visit.   Subjective Assessment - 10/11/20 1107    Subjective I am doing better.  Pain is about a 3/10.  When asked if he has been getting up more to move from laying in bed: "actually I find that laying on my back really helps my pain."  I do  stretch more in my bed and that feels good.    Pertinent History PMH: depression, spinal stenosis L2/3, schizophrenia, history of falls    Limitations Standing;Walking;Other (comment)    How long can you stand comfortably? 30 min    How long can you walk comfortably? 30 min    Diagnostic tests M54.16 (ICD-10-CM) - Chronic lumbar radiculopathy  M25.561,M25.562,G89.29 (ICD-10-CM) - Chronic pain of both knees    Patient Stated Goals decrease back pain with bed mobility, balance/strength, stairs    Currently in Pain? Yes    Pain Score 3     Pain Location Back    Pain Orientation Lower    Pain Descriptors / Indicators Sore    Pain Type Chronic pain;Acute pain    Pain Onset More than a month ago    Pain Frequency Constant    Pain Relieving Factors laying flat on back                             [facility] Adult PT Treatment/Exercise - 10/11/20 0001      Bed Mobility   Bed Mobility Sit to Sidelying Left;Supine to Sit  improving independence and pain with this     Ambulation/Gait   Ambulation/Gait Yes    Ambulation Distance (Feet) 240 Feet    Gait Comments PT TC and VC for arm swing and transverse plane motion of trunk/shoulders      Self-Care   Self-Care Other Self-Care Comments    Other Self-Care Comments  benefits of breaking up pattern of lying in bed to sprinkle movement/change of position in for joint mobility, flexibilty, circulation, strength      Neuro Re-ed    Neuro Re-ed Details  clock drill fwd/diag/lat tap and return to center, add UE reach with step x 5 cycles each leg to colorful discs      Exercises   Exercises Lumbar;Knee/Hip;Shoulder      Lumbar Exercises: Stretches   Active Hamstring Stretch Left;Right;30 seconds;2 reps    Active Hamstring Stretch Limitations seated    Other Lumbar Stretch Exercise seated trunk rotation arms across chest A/ROM x 5 rounds    Other Lumbar Stretch Exercise Seated side bend stretch (AROM) 3x Bil      Lumbar Exercises:  Aerobic   Nustep L2 x 10', seat 12 is best for knees, arms 10, PT present to monitor      Lumbar Exercises: Supine   Pelvic Tilt Limitations attempted but Pt unable to perform without valsalva    Bent Knee Raise 20 reps      Knee/Hip Exercises: Standing   Forward Step Up Both;1 set;5 reps;Hand Hold: 2;Step Height: 6"      Shoulder Exercises: Supine   Horizontal ABduction Strengthening;Both;10 reps;Theraband    Theraband Level (Shoulder Horizontal ABduction) Level 2 (Red)    Flexion AROM;10 reps    Flexion Limitations holding dowel for trunk elongation                    PT Short Term Goals - 10/07/20 1239      PT SHORT TERM GOAL #2   Title improve strength to perform 5x sit to stand in < or = to 18 seconds to improve safety    Time 4    Period Weeks    Status Achieved   16     PT SHORT TERM GOAL #3   Title Improve TUG test to 13 sec or less for improved safety and reduced fall risk    Period Weeks    Status Achieved   12 sec            PT Long Term Goals - 09/04/20 1340      PT LONG TERM GOAL #1   Title be independent in advanced HEP    Time 10    Period Weeks    Status New    Target Date 11/13/20      PT LONG TERM GOAL #2   Title Pt will be able to perform multi-directional stepping and reaching strategies without LOB    Baseline -    Time 12    Period Weeks    Status New    Target Date 11/27/20      PT LONG TERM GOAL #3   Title improve LE strength and endurance to walk for 30 minutes with min symptom aggravation to improve endurance in the community    Baseline 30 min but with signif pain and fatigue      PT LONG TERM GOAL #4   Title improve 5x sit to stand to < or = to 16 seconds to reduce falls risk  Baseline 21    Time 12    Period Weeks    Status New    Target Date 11/27/20      PT LONG TERM GOAL #5   Title Pt will demo improved bed mobility with min use of UEs to reduce pain with rolling Lt/Rt in bed at night.    Baseline pulls on  headboard and nightstand, signif pain    Time 12    Period Weeks    Status New    Target Date 11/27/20                 Plan - 10/11/20 1145    Clinical Impression Statement Pt arrived with low level pain 3/10.  He continues to lay in bed for relief.  PT encouraged him to break up this posture/pattern by getting up more often even if for short durations.  PT added UE reach with multi-directional stepping today to work toward functional balance for LTG.  Pt was able to ambulate 240' without pain or fatigue today with PT focus on cueing arm swing and transferse plane motion of trunk.  Pt has fallen stepping up on curbs so worked on fwd step ups 6" step today with bil UE support with good tolerance.  Pt with much improving ease of transfers/transitions from sit to stand and with bed mobility.  Needed min assist from SL to sit.  Pt is unable to perform pelvic tilt but did better engaging TA with supine small hooklying march.  D/C'd seated blue ball rollouts secondary to Pt recall of pain with this.  Continue along POC.    Comorbidities depression, spinal stenosis L2/3, schizophrenia, history of falls    PT Frequency 2x / week    PT Duration 12 weeks    PT Treatment/Interventions ADLs/Self Care Home Management;Aquatic Therapy;Cryotherapy;Electrical Stimulation;Moist Heat;Iontophoresis 4mg /ml Dexamethasone;Neuromuscular re-education;Balance training;Therapeutic exercise;Therapeutic activities;Functional mobility training;Stair training;Gait training;Patient/family education;Manual techniques;Passive range of motion;Dry needling;Joint Manipulations;Spinal Manipulations    PT Next Visit Plan continue functional strength, mobility, gait endurance, transfers/bed mobility, step ups, dynamic balance, Addaday to hip and lumbar as needed    PT Home Exercise Plan Access Code: [email or web address]    Consulted and Agree with Plan of Care Patient           Patient will benefit from skilled therapeutic intervention in  order to improve the following deficits and impairments:     Visit Diagnosis: Chronic bilateral low back pain with right-sided sciatica  Muscle weakness (generalized)  Other abnormalities of gait and mobility  Chronic pain of left knee  Chronic pain of right knee     Problem List Patient Active Problem List   Diagnosis Date Noted  . Left knee pain 07/23/2020  . Spinal stenosis of lumbar region 01/16/2020  . Chronic lumbar radiculopathy 12/08/2019  . Hyperlipidemia associated with type 2 diabetes mellitus ([place]) 12/08/2019  . Dizziness 11/03/2019  . Paronychia of left index finger 05/19/2019  . Fall at home 11/24/2018  . Rosacea 10/24/2018  . Loss of weight 09/09/2018  . Schizoaffective disorder, depressive type ([place]) 04/08/2018  . Cough 10/20/2017  . Contact dermatitis 04/16/2017  . Varicose veins of bilateral lower extremities with other complications 44/96/7591  . Erectile dysfunction 04/26/2015  . Hypothyroidism   . Seizures ([place])   . Persistent atrial fibrillation ([place])   . Obesity, Class III, BMI 40-49.9 (morbid obesity) ([place])   . Acute right-sided low back pain   . Seborrheic dermatitis 02/10/2007  . T2DM (type 2 diabetes mellitus) ([place])  02/10/2007    Baruch Merl, PT 10/11/20 11:52 AM   [facility] [address] [place], [place], [address] Phone: [phone]   Fax:  [phone]  Name: KASHON KRAYNAK MRN: [id number] Date of Birth: 1961-01-24

## 2020-10-14 ENCOUNTER — Encounter: Payer: Self-pay | Admitting: Physical Therapy

## 2020-10-14 ENCOUNTER — Other Ambulatory Visit: Payer: Self-pay

## 2020-10-14 ENCOUNTER — Ambulatory Visit: Payer: Medicare Other | Admitting: Physical Therapy

## 2020-10-14 DIAGNOSIS — G8929 Other chronic pain: Secondary | ICD-10-CM

## 2020-10-14 DIAGNOSIS — R2689 Other abnormalities of gait and mobility: Secondary | ICD-10-CM

## 2020-10-14 DIAGNOSIS — M5441 Lumbago with sciatica, right side: Secondary | ICD-10-CM

## 2020-10-14 DIAGNOSIS — M6281 Muscle weakness (generalized): Secondary | ICD-10-CM

## 2020-10-14 NOTE — Therapy (Signed)
[facility] [address], [place], [place], [address] Phone: [phone]   Fax:  [phone]  Physical Therapy Treatment  Patient Details  Name: [name] MRN: [id number] Date of Birth: [birth year] Referring Provider (PT): Marybelle Killings, MD   Encounter Date: 10/14/2020   PT End of Session - 10/14/20 1231    Visit Number 7    Date for PT Re-Evaluation 11/27/20    Authorization Type Medicare Part A and B - KX at visit 15    Progress Note Due on Visit 10    PT Start Time 1229    PT Stop Time 1307    PT Time Calculation (min) 38 min    Activity Tolerance Patient tolerated treatment well    Behavior During Therapy [facility] for tasks assessed/performed           Past Medical History:  Diagnosis Date  . A-fib ([place])   . Arrhythmia   . Chronic a-fib ([place])   . Closed fracture of left distal radius   . Depression   . Depression   . Hypothyroidism   . Low back pain   . Morbid obesity, BMI unknown ([place])   . Osteoarthritis    oa in bilateral knees  . Schizophrenia ([place])   . Seizures ([place])   . Thyroid disease   . Varicose veins     Past Surgical History:  Procedure Laterality Date  . ACHILLES TENDON REPAIR  approx. 2004   right foot  . CATARACT EXTRACTION     bilateral  . OPEN REDUCTION INTERNAL FIXATION (ORIF) DISTAL RADIAL FRACTURE Left 07/21/2018   Procedure: OPEN REDUCTION INTERNAL FIXATION (ORIF) DISTAL RADIAL FRACTURE;  Surgeon: [name], MD;  Location: [place];  Service: Orthopedics;  Laterality: Left;  . stab phlebectomy  Right 11/09/2016   stab phlebectomy R leg by Tinnie Gens MD  . TOOTH EXTRACTION      There were no vitals filed for this visit.   Subjective Assessment - 10/14/20 1233    Subjective I was sore over the weekend. I have no pain today.    Pertinent History PMH: depression, spinal stenosis L2/3, schizophrenia, history of falls    Diagnostic tests M54.16  (ICD-10-CM) - Chronic lumbar radiculopathy  M25.561,M25.562,G89.29 (ICD-10-CM) - Chronic pain of both knees    Currently in Pain? No/denies    Aggravating Factors  Not sure    Pain Relieving Factors laying flat on his back    Multiple Pain Sites No                             [facility] Adult PT Treatment/Exercise - 10/14/20 0001      Neuro Re-ed    Neuro Re-ed Details  clock drill fwd/diag/lat tap and return to center, add UE reach with step x 5 cycles each leg to colorful discs   C/o LT knee pain     Lumbar Exercises: Stretches   Active Hamstring Stretch Left;Right;30 seconds;2 reps    Active Hamstring Stretch Limitations seated    Other Lumbar Stretch Exercise seated trunk rotation arms holding the cane today A/ROM  10x    Other Lumbar Stretch Exercise Seated side bend stretch (AROM) 3x Bil      Lumbar Exercises: Aerobic   Nustep L3 x 10 min with PTA present to discuss status      Lumbar Exercises: Standing   Heel Raises 10 reps  finger support required   Other Standing Lumbar Exercises Marching 10x alternating      Lumbar Exercises: Seated   Other Seated Lumbar Exercises Chair sit ups holding 5#KB 5x      Knee/Hip Exercises: Standing   Hip Abduction AROM;Stengthening;Both;1 set;10 reps;Knee straight    Abduction Limitations VC to use the least amt of UE  to maintain balance    Other Standing Knee Exercises Alternating toe taps 10x at TM , PT aencouraged no UE, but he prefered 1 finger on each hand      Shoulder Exercises: Seated   Horizontal ABduction Strengthening;Both;10 reps;Theraband    Theraband Level (Shoulder Horizontal ABduction) Level 2 (Red)                    PT Short Term Goals - 10/07/20 1239      PT SHORT TERM GOAL #2   Title improve strength to perform 5x sit to stand in < or = to 18 seconds to improve safety    Time 4    Period Weeks    Status Achieved   16     PT SHORT TERM GOAL #3   Title Improve TUG test to 13 sec or less  for improved safety and reduced fall risk    Period Weeks    Status Achieved   12 sec            PT Long Term Goals - 09/04/20 1340      PT LONG TERM GOAL #1   Title be independent in advanced HEP    Time 10    Period Weeks    Status New    Target Date 11/13/20      PT LONG TERM GOAL #2   Title Pt will be able to perform multi-directional stepping and reaching strategies without LOB    Baseline -    Time 12    Period Weeks    Status New    Target Date 11/27/20      PT LONG TERM GOAL #3   Title improve LE strength and endurance to walk for 30 minutes with min symptom aggravation to improve endurance in the community    Baseline 30 min but with signif pain and fatigue      PT LONG TERM GOAL #4   Title improve 5x sit to stand to < or = to 16 seconds to reduce falls risk    Baseline 21    Time 12    Period Weeks    Status New    Target Date 11/27/20      PT LONG TERM GOAL #5   Title Pt will demo improved bed mobility with min use of UEs to reduce pain with rolling Lt/Rt in bed at night.    Baseline pulls on headboard and nightstand, signif pain    Time 12    Period Weeks    Status New    Target Date 11/27/20                 Plan - 10/14/20 1231    Clinical Impression Statement Pt reports intermittent back pain and/or stiffness. Pt asked to try more exercises in standing to strengthening his trunk. pt did sit inbetween his exercises to rest his back, this seemed to help and please pt.    Personal Factors and Comorbidities Comorbidity 1;Comorbidity 2;Comorbidity 3+;Age;Fitness    Comorbidities depression, spinal stenosis L2/3, schizophrenia, history of falls    Examination-Activity Limitations Locomotion Level;Transfers;Bed  Mobility;Bend;Stairs;Stand;Squat;Lift    Examination-Participation Restrictions Community Activity;Shop;Cleaning    Stability/Clinical Decision Making Stable/Uncomplicated    Rehab Potential Good    PT Frequency 2x / week    PT Duration 12  weeks    PT Treatment/Interventions ADLs/Self Care Home Management;Aquatic Therapy;Cryotherapy;Electrical Stimulation;Moist Heat;Iontophoresis 4mg /ml Dexamethasone;Neuromuscular re-education;Balance training;Therapeutic exercise;Therapeutic activities;Functional mobility training;Stair training;Gait training;Patient/family education;Manual techniques;Passive range of motion;Dry needling;Joint Manipulations;Spinal Manipulations    PT Next Visit Plan continue functional strength, mobility, gait endurance, transfers/bed mobility, step ups, dynamic balance, Addaday to hip and lumbar as needed    PT Home Exercise Plan Access Code: [email or web address]    Consulted and Agree with Plan of Care Patient           Patient will benefit from skilled therapeutic intervention in order to improve the following deficits and impairments:  Abnormal gait,Decreased range of motion,Difficulty walking,Obesity,Decreased endurance,Decreased activity tolerance,Pain,Decreased balance,Impaired flexibility,Improper body mechanics,Decreased mobility,Decreased strength  Visit Diagnosis: Chronic bilateral low back pain with right-sided sciatica  Muscle weakness (generalized)  Other abnormalities of gait and mobility  Chronic pain of left knee  Chronic pain of right knee     Problem List Patient Active Problem List   Diagnosis Date Noted  . Left knee pain 07/23/2020  . Spinal stenosis of lumbar region 01/16/2020  . Chronic lumbar radiculopathy 12/08/2019  . Hyperlipidemia associated with type 2 diabetes mellitus ([place]) 12/08/2019  . Dizziness 11/03/2019  . Paronychia of left index finger 05/19/2019  . Fall at home 11/24/2018  . Rosacea 10/24/2018  . Loss of weight 09/09/2018  . Schizoaffective disorder, depressive type ([place]) 04/08/2018  . Cough 10/20/2017  . Contact dermatitis 04/16/2017  . Varicose veins of bilateral lower extremities with other complications 76/22/6333  . Erectile dysfunction 04/26/2015  .  Hypothyroidism   . Seizures ([place])   . Persistent atrial fibrillation ([place])   . Obesity, Class III, BMI 40-49.9 (morbid obesity) ([place])   . Acute right-sided low back pain   . Seborrheic dermatitis 02/10/2007  . T2DM (type 2 diabetes mellitus) ([place]) 02/10/2007    Tynika Luddy, PTA 10/14/2020, 1:11 PM   [facility] [address], [place], [place], [address] Phone: [phone]   Fax:  [phone]  Name: [name] MRN: [id number] Date of Birth: 11-10-1960

## 2020-10-16 ENCOUNTER — Ambulatory Visit: Payer: Medicare Other | Admitting: Physical Therapy

## 2020-10-16 ENCOUNTER — Other Ambulatory Visit: Payer: Self-pay

## 2020-10-16 ENCOUNTER — Encounter: Payer: Self-pay | Admitting: Physical Therapy

## 2020-10-16 DIAGNOSIS — M5441 Lumbago with sciatica, right side: Secondary | ICD-10-CM

## 2020-10-16 DIAGNOSIS — M6281 Muscle weakness (generalized): Secondary | ICD-10-CM

## 2020-10-16 DIAGNOSIS — R2689 Other abnormalities of gait and mobility: Secondary | ICD-10-CM

## 2020-10-16 DIAGNOSIS — M25562 Pain in left knee: Secondary | ICD-10-CM

## 2020-10-16 DIAGNOSIS — G8929 Other chronic pain: Secondary | ICD-10-CM

## 2020-10-16 NOTE — Therapy (Signed)
[facility] [address] [place], [place], [address] Phone: [phone]   Fax:  [phone]  Physical Therapy Treatment  Patient Details  Name: [name] MRN: [id number] Date of Birth: 11/29/1960 Referring Provider (PT): Marybelle Killings, MD   Encounter Date: 10/16/2020   PT End of Session - 10/16/20 1237    Visit Number 8    Date for PT Re-Evaluation 11/27/20    Authorization Type Medicare Part A and B - KX at visit 15    Progress Note Due on Visit 10    PT Start Time 1228    PT Stop Time 1307    PT Time Calculation (min) 39 min    Activity Tolerance Patient tolerated treatment well    Behavior During Therapy [facility] for tasks assessed/performed           Past Medical History:  Diagnosis Date  . A-fib ([place])   . Arrhythmia   . Chronic a-fib ([place])   . Closed fracture of left distal radius   . Depression   . Depression   . Hypothyroidism   . Low back pain   . Morbid obesity, BMI unknown ([place])   . Osteoarthritis    oa in bilateral knees  . Schizophrenia ([place])   . Seizures ([place])   . Thyroid disease   . Varicose veins     Past Surgical History:  Procedure Laterality Date  . ACHILLES TENDON REPAIR  approx. 2004   right foot  . CATARACT EXTRACTION     bilateral  . OPEN REDUCTION INTERNAL FIXATION (ORIF) DISTAL RADIAL FRACTURE Left 07/21/2018   Procedure: OPEN REDUCTION INTERNAL FIXATION (ORIF) DISTAL RADIAL FRACTURE;  Surgeon: [name], MD;  Location: [place];  Service: Orthopedics;  Laterality: Left;  . stab phlebectomy  Right 11/09/2016   stab phlebectomy R leg by Tinnie Gens MD  . TOOTH EXTRACTION      There were no vitals filed for this visit.   Subjective Assessment - 10/16/20 1238    Subjective I felt ok the rest of the day but the next day my hip ( LT) locked up on me and really hurt. I actually thought of taking a walk on Monday, but I didn't.    Pertinent History PMH:  depression, spinal stenosis L2/3, schizophrenia, history of falls    Diagnostic tests M54.16 (ICD-10-CM) - Chronic lumbar radiculopathy  M25.561,M25.562,G89.29 (ICD-10-CM) - Chronic pain of both knees    Currently in Pain? No/denies                             [facility] Adult PT Treatment/Exercise - 10/16/20 0001      Lumbar Exercises: Stretches   Active Hamstring Stretch Left;Right;3 reps;10 seconds    Lower Trunk Rotation --   10x rocking side to side     Lumbar Exercises: Aerobic   Nustep L3 x 10 min with PTA present to discuss status      Lumbar Exercises: Supine   Heel Slides --   5x bil with floor slider, VC to hold tummy in   Other Supine Lumbar Exercises Marching 10x      Knee/Hip Exercises: Stretches   Other Knee/Hip Stretches leg lengthener stretch 3x bil      Knee/Hip Exercises: Supine   Other Supine Knee/Hip Exercises isometric hip extensions into mat  5x bil  PT Short Term Goals - 10/07/20 1239      PT SHORT TERM GOAL #2   Title improve strength to perform 5x sit to stand in < or = to 18 seconds to improve safety    Time 4    Period Weeks    Status Achieved   16     PT SHORT TERM GOAL #3   Title Improve TUG test to 13 sec or less for improved safety and reduced fall risk    Period Weeks    Status Achieved   12 sec            PT Long Term Goals - 09/04/20 1340      PT LONG TERM GOAL #1   Title be independent in advanced HEP    Time 10    Period Weeks    Status New    Target Date 11/13/20      PT LONG TERM GOAL #2   Title Pt will be able to perform multi-directional stepping and reaching strategies without LOB    Baseline -    Time 12    Period Weeks    Status New    Target Date 11/27/20      PT LONG TERM GOAL #3   Title improve LE strength and endurance to walk for 30 minutes with min symptom aggravation to improve endurance in the community    Baseline 30 min but with signif pain and fatigue       PT LONG TERM GOAL #4   Title improve 5x sit to stand to < or = to 16 seconds to reduce falls risk    Baseline 21    Time 12    Period Weeks    Status New    Target Date 11/27/20      PT LONG TERM GOAL #5   Title Pt will demo improved bed mobility with min use of UEs to reduce pain with rolling Lt/Rt in bed at night.    Baseline pulls on headboard and nightstand, signif pain    Time 12    Period Weeks    Status New    Target Date 11/27/20                 Plan - 10/16/20 1237    Clinical Impression Statement Pt arrives with no pain today but he reports hurting the day after his session on Monday. He thinks we may have done too much in standing and would like to try exercising laying on the mat today.    Personal Factors and Comorbidities Comorbidity 1;Comorbidity 2;Comorbidity 3+;Age;Fitness    Comorbidities depression, spinal stenosis L2/3, schizophrenia, history of falls    Examination-Activity Limitations Locomotion Level;Transfers;Bed Mobility;Bend;Stairs;Stand;Squat;Lift    Stability/Clinical Decision Making Stable/Uncomplicated    Rehab Potential Good    PT Frequency 2x / week    PT Duration 12 weeks    PT Treatment/Interventions ADLs/Self Care Home Management;Aquatic Therapy;Cryotherapy;Electrical Stimulation;Moist Heat;Iontophoresis 4mg /ml Dexamethasone;Neuromuscular re-education;Balance training;Therapeutic exercise;Therapeutic activities;Functional mobility training;Stair training;Gait training;Patient/family education;Manual techniques;Passive range of motion;Dry needling;Joint Manipulations;Spinal Manipulations    PT Next Visit Plan continue functional strength, mobility, gait endurance, transfers/bed mobility, step ups, dynamic balance, [place] to hip and lumbar as needed    PT Home Exercise Plan Access Code: [email or web address]    Consulted and Agree with Plan of Care Patient           Patient will benefit from skilled therapeutic intervention in order to improve the  following deficits and impairments:  Abnormal gait,Decreased range of motion,Difficulty walking,Obesity,Decreased endurance,Decreased activity tolerance,Pain,Decreased balance,Impaired flexibility,Improper body mechanics,Decreased mobility,Decreased strength  Visit Diagnosis: Chronic bilateral low back pain with right-sided sciatica  Muscle weakness (generalized)  Other abnormalities of gait and mobility  Chronic pain of left knee  Chronic pain of right knee     Problem List Patient Active Problem List   Diagnosis Date Noted  . Left knee pain 07/23/2020  . Spinal stenosis of lumbar region 01/16/2020  . Chronic lumbar radiculopathy 12/08/2019  . Hyperlipidemia associated with type 2 diabetes mellitus ([place]) 12/08/2019  . Dizziness 11/03/2019  . Paronychia of left index finger 05/19/2019  . Fall at home 11/24/2018  . Rosacea 10/24/2018  . Loss of weight 09/09/2018  . Schizoaffective disorder, depressive type ([place]) 04/08/2018  . Cough 10/20/2017  . Contact dermatitis 04/16/2017  . Varicose veins of bilateral lower extremities with other complications 11/55/2080  . Erectile dysfunction 04/26/2015  . Hypothyroidism   . Seizures ([place])   . Persistent atrial fibrillation ([place])   . Obesity, Class III, BMI 40-49.9 (morbid obesity) ([place])   . Acute right-sided low back pain   . Seborrheic dermatitis 02/10/2007  . T2DM (type 2 diabetes mellitus) ([place]) 02/10/2007    Dwyane Dupree, PTA 10/16/2020, 1:08 PM  [facility] [address], [place], [place], [address] Phone: [phone]   Fax:  [phone]  Name: [name] MRN: [id number] Date of Birth: Jul 21, 1960

## 2020-10-21 ENCOUNTER — Encounter: Payer: Medicare Other | Admitting: Physical Therapy

## 2020-10-23 ENCOUNTER — Ambulatory Visit: Payer: Medicare Other | Admitting: Physical Therapy

## 2020-10-23 ENCOUNTER — Encounter: Payer: Self-pay | Admitting: Physical Therapy

## 2020-10-23 ENCOUNTER — Other Ambulatory Visit: Payer: Self-pay

## 2020-10-23 DIAGNOSIS — M5441 Lumbago with sciatica, right side: Secondary | ICD-10-CM | POA: Diagnosis not present

## 2020-10-23 DIAGNOSIS — M6281 Muscle weakness (generalized): Secondary | ICD-10-CM

## 2020-10-23 DIAGNOSIS — G8929 Other chronic pain: Secondary | ICD-10-CM

## 2020-10-23 DIAGNOSIS — R2689 Other abnormalities of gait and mobility: Secondary | ICD-10-CM

## 2020-10-23 NOTE — Therapy (Signed)
[facility] [address] [place], [place], [address] Phone: [phone]   Fax:  [phone]  Physical Therapy Treatment  Patient Details  Name: [name] MRN: [id number] Date of Birth: [birth year] Referring Provider (PT): Marybelle Killings, MD   Encounter Date: 10/23/2020   PT End of Session - 10/23/20 1225    Visit Number 9    Date for PT Re-Evaluation 11/27/20    Authorization Type Medicare Part A and B - KX at visit 15    Progress Note Due on Visit 10    PT Start Time 1222    PT Stop Time 1303    PT Time Calculation (min) 41 min    Activity Tolerance Patient tolerated treatment well    Behavior During Therapy [facility] for tasks assessed/performed           Past Medical History:  Diagnosis Date  . A-fib ([place])   . Arrhythmia   . Chronic a-fib ([place])   . Closed fracture of left distal radius   . Depression   . Depression   . Hypothyroidism   . Low back pain   . Morbid obesity, BMI unknown ([place])   . Osteoarthritis    oa in bilateral knees  . Schizophrenia ([place])   . Seizures ([place])   . Thyroid disease   . Varicose veins     Past Surgical History:  Procedure Laterality Date  . ACHILLES TENDON REPAIR  approx. 2004   right foot  . CATARACT EXTRACTION     bilateral  . OPEN REDUCTION INTERNAL FIXATION (ORIF) DISTAL RADIAL FRACTURE Left 07/21/2018   Procedure: OPEN REDUCTION INTERNAL FIXATION (ORIF) DISTAL RADIAL FRACTURE;  Surgeon: [name], MD;  Location: [place];  Service: Orthopedics;  Laterality: Left;  . stab phlebectomy  Right 11/09/2016   stab phlebectomy R leg by Tinnie Gens MD  . TOOTH EXTRACTION      There were no vitals filed for this visit.   Subjective Assessment - 10/23/20 1226    Subjective I think laying down to do my exercises was better. I was stiff but not as painful.    Pertinent History PMH: depression, spinal stenosis L2/3, schizophrenia, history of falls     Currently in Pain? No/denies    Multiple Pain Sites No                             [facility] Adult PT Treatment/Exercise - 10/23/20 0001      Lumbar Exercises: Aerobic   Nustep L3 x 10 min with PTA present to discuss status      Lumbar Exercises: Supine   Heel Slides --   8x bil with floor slider, VC to hold tummy in   Other Supine Lumbar Exercises Marching 10x2 with Vc for core activation      Knee/Hip Exercises: Stretches   Other Knee/Hip Stretches leg lengthener stretch 5x bil: leg press 5x bil 3 sec hold      Knee/Hip Exercises: Standing   Forward Step Up Both;1 set;5 reps;Hand Hold: 2;Step Height: 6"      Shoulder Exercises: Supine   Horizontal ABduction Strengthening;Both;10 reps;Theraband    Theraband Level (Shoulder Horizontal ABduction) Level 3 ([place])    Flexion Limitations holding dowel for trunk elongation   5x flexion                   PT Short Term Goals -  10/07/20 1239      PT SHORT TERM GOAL #2   Title improve strength to perform 5x sit to stand in < or = to 18 seconds to improve safety    Time 4    Period Weeks    Status Achieved   16     PT SHORT TERM GOAL #3   Title Improve TUG test to 13 sec or less for improved safety and reduced fall risk    Period Weeks    Status Achieved   12 sec            PT Long Term Goals - 09/04/20 1340      PT LONG TERM GOAL #1   Title be independent in advanced HEP    Time 10    Period Weeks    Status New    Target Date 11/13/20      PT LONG TERM GOAL #2   Title Pt will be able to perform multi-directional stepping and reaching strategies without LOB    Baseline -    Time 12    Period Weeks    Status New    Target Date 11/27/20      PT LONG TERM GOAL #3   Title improve LE strength and endurance to walk for 30 minutes with min symptom aggravation to improve endurance in the community    Baseline 30 min but with signif pain and fatigue      PT LONG TERM GOAL #4   Title improve 5x  sit to stand to < or = to 16 seconds to reduce falls risk    Baseline 21    Time 12    Period Weeks    Status New    Target Date 11/27/20      PT LONG TERM GOAL #5   Title Pt will demo improved bed mobility with min use of UEs to reduce pain with rolling Lt/Rt in bed at night.    Baseline pulls on headboard and nightstand, signif pain    Time 12    Period Weeks    Status New    Target Date 11/27/20                 Plan - 10/23/20 1302    Clinical Impression Statement Pt arrives with no pain, reports doing some stretching this AM. PTA verbally encouraged him to continue with that process and to look at his stretches like medicine and do them daily. Pt seems to tolerate supine exercises ( gentle) without pain and without reports of increased pain the following day. Pt has not walked for recreation yet but reports he is considering it today.    Personal Factors and Comorbidities Comorbidity 1;Comorbidity 2;Comorbidity 3+;Age;Fitness    Comorbidities depression, spinal stenosis L2/3, schizophrenia, history of falls    Examination-Activity Limitations Locomotion Level;Transfers;Bed Mobility;Bend;Stairs;Stand;Squat;Lift    Examination-Participation Restrictions Community Activity;Shop;Cleaning    Stability/Clinical Decision Making Stable/Uncomplicated    Rehab Potential Good    PT Frequency 2x / week    PT Duration 12 weeks    PT Treatment/Interventions ADLs/Self Care Home Management;Aquatic Therapy;Cryotherapy;Electrical Stimulation;Moist Heat;Iontophoresis 4mg /ml Dexamethasone;Neuromuscular re-education;Balance training;Therapeutic exercise;Therapeutic activities;Functional mobility training;Stair training;Gait training;Patient/family education;Manual techniques;Passive range of motion;Dry needling;Joint Manipulations;Spinal Manipulations    PT Next Visit Plan continue functional strength, mobility, gait endurance, transfers/bed mobility, step ups, dynamic balance, see if pt walked for  exercise.    PT Home Exercise Plan Access Code: [email or web address]    Consulted and Agree with Plan of  Care Patient           Patient will benefit from skilled therapeutic intervention in order to improve the following deficits and impairments:  Abnormal gait,Decreased range of motion,Difficulty walking,Obesity,Decreased endurance,Decreased activity tolerance,Pain,Decreased balance,Impaired flexibility,Improper body mechanics,Decreased mobility,Decreased strength  Visit Diagnosis: Chronic bilateral low back pain with right-sided sciatica  Muscle weakness (generalized)  Other abnormalities of gait and mobility  Chronic pain of left knee  Chronic pain of right knee     Problem List Patient Active Problem List   Diagnosis Date Noted  . Left knee pain 07/23/2020  . Spinal stenosis of lumbar region 01/16/2020  . Chronic lumbar radiculopathy 12/08/2019  . Hyperlipidemia associated with type 2 diabetes mellitus ([place]) 12/08/2019  . Dizziness 11/03/2019  . Paronychia of left index finger 05/19/2019  . Fall at home 11/24/2018  . Rosacea 10/24/2018  . Loss of weight 09/09/2018  . Schizoaffective disorder, depressive type ([place]) 04/08/2018  . Cough 10/20/2017  . Contact dermatitis 04/16/2017  . Varicose veins of bilateral lower extremities with other complications 88/32/5498  . Erectile dysfunction 04/26/2015  . Hypothyroidism   . Seizures ([place])   . Persistent atrial fibrillation ([place])   . Obesity, Class III, BMI 40-49.9 (morbid obesity) ([place])   . Acute right-sided low back pain   . Seborrheic dermatitis 02/10/2007  . T2DM (type 2 diabetes mellitus) ([place]) 02/10/2007    Raydell Maners, PTA 10/23/2020, 1:05 PM  [facility] [address], [place], [place], [address] Phone: [phone]   Fax:  [phone]  Name: [name] MRN: [id number] Date of Birth: 1961-04-24

## 2020-10-28 ENCOUNTER — Ambulatory Visit: Payer: Medicare Other | Admitting: Physical Therapy

## 2020-10-28 ENCOUNTER — Encounter: Payer: Self-pay | Admitting: Physical Therapy

## 2020-10-28 ENCOUNTER — Other Ambulatory Visit: Payer: Self-pay

## 2020-10-28 DIAGNOSIS — M25562 Pain in left knee: Secondary | ICD-10-CM

## 2020-10-28 DIAGNOSIS — G8929 Other chronic pain: Secondary | ICD-10-CM

## 2020-10-28 DIAGNOSIS — R2689 Other abnormalities of gait and mobility: Secondary | ICD-10-CM

## 2020-10-28 DIAGNOSIS — M6281 Muscle weakness (generalized): Secondary | ICD-10-CM

## 2020-10-28 DIAGNOSIS — M5441 Lumbago with sciatica, right side: Secondary | ICD-10-CM | POA: Diagnosis not present

## 2020-10-28 NOTE — Therapy (Signed)
[facility] [address], [place], [place], [address] Phone: [phone]   Fax:  [phone]  Physical Therapy Treatment  Patient Details  Name: [name] MRN: [id number] Date of Birth: 06/19/1961 Referring Provider (PT): Marybelle Killings, MD  Progress Note Reporting Period 09/04/20 to 10/28/20  See note below for Objective Data and Assessment of Progress/Goals.      Encounter Date: 10/28/2020   PT End of Session - 10/28/20 1235    Visit Number 10    Date for PT Re-Evaluation 11/27/20    Authorization Type Medicare Part A and B - KX at visit 15    Progress Note Due on Visit 20    PT Start Time 1147    PT Stop Time 1234    PT Time Calculation (min) 47 min    Activity Tolerance Patient tolerated treatment well    Behavior During Therapy WFL for tasks assessed/performed           Past Medical History:  Diagnosis Date  . A-fib ([place])   . Arrhythmia   . Chronic a-fib ([place])   . Closed fracture of left distal radius   . Depression   . Depression   . Hypothyroidism   . Low back pain   . Morbid obesity, BMI unknown ([place])   . Osteoarthritis    oa in bilateral knees  . Schizophrenia ([place])   . Seizures ([place])   . Thyroid disease   . Varicose veins     Past Surgical History:  Procedure Laterality Date  . ACHILLES TENDON REPAIR  approx. 2004   right foot  . CATARACT EXTRACTION     bilateral  . OPEN REDUCTION INTERNAL FIXATION (ORIF) DISTAL RADIAL FRACTURE Left 07/21/2018   Procedure: OPEN REDUCTION INTERNAL FIXATION (ORIF) DISTAL RADIAL FRACTURE;  Surgeon: [name], MD;  Location: [facility];  Service: Orthopedics;  Laterality: Left;  . stab phlebectomy  Right 11/09/2016   stab phlebectomy R leg by Tinnie Gens MD  . TOOTH EXTRACTION      There were no vitals filed for this visit.   Subjective Assessment - 10/28/20 1150    Subjective I didn't walk for exercise since last visit.  I  layed around the apartment for a couple of days and think that really helped.  I have had little to no pain for the last two days.  Rest helps.  I did have some Lt knee pain when I woke up.    Pertinent History PMH: depression, spinal stenosis L2/3, schizophrenia, history of falls    Limitations Standing;Walking;Other (comment)    How long can you stand comfortably? 30 min    How long can you walk comfortably? 30 min    Diagnostic tests M54.16 (ICD-10-CM) - Chronic lumbar radiculopathy  M25.561,M25.562,G89.29 (ICD-10-CM) - Chronic pain of both knees    Patient Stated Goals decrease back pain with bed mobility, balance/strength, stairs    Currently in Pain? Yes    Pain Score 1     Pain Location Back    Pain Orientation Lower    Pain Descriptors / Indicators Sore    Pain Type Chronic pain    Pain Radiating Towards 3/10 Lt knee    Pain Onset More than a month ago    Pain Frequency Constant    Pain Relieving Factors laying flat on back              [facility] PT Assessment - 10/28/20 0001  AROM   AROM Assessment Site Lumbar    Lumbar Flexion limited 40%    Lumbar - Right Side Bend 5    Lumbar - Left Side Bend 5      Strength   Overall Strength Comments knees 4+/5 bil, hips 4/5 bil, functional trunk strength limited for bed mobility      Transfers   Five time sit to stand comments  15 sec      Standardized Balance Assessment   Standardized Balance Assessment Five Times Sit to Stand;Timed Up and Go Test    Five times sit to stand comments  15 sec      Timed Up and Go Test   TUG Normal TUG    Normal TUG (seconds) 8                         [facility] Adult PT Treatment/Exercise - 10/28/20 0001      Ambulation/Gait   Ambulation/Gait Yes    Ambulation Distance (Feet) 702 Feet    Gait Comments outdoor ambulation x 5' covering 702', improved arm swing      Exercises   Exercises Lumbar;Knee/Hip;Shoulder      Lumbar Exercises: Aerobic   Nustep L3 x 10 PT present to  discuss status and monitor pain      Lumbar Exercises: Supine   Heel Slides 10 reps    Heel Slides Limitations foot on slider, VC for ab draw in   bil   Bent Knee Raise 10 reps    Bent Knee Raise Limitations small range only for ab activation without LBP    Other Supine Lumbar Exercises leg lengthener 3x5", whole leg press into mat table 5x5", TC needed for leg lengthener      Shoulder Exercises: Supine   Horizontal ABduction Strengthening;Theraband;20 reps    Theraband Level (Shoulder Horizontal ABduction) Level 3 (Green)    Flexion Limitations holding dowel for trunk elongation   10 reps   Diagonals Strengthening;Both;5 reps;Theraband    Theraband Level (Shoulder Diagonals) Level 3 (Green)    Diagonals Limitations draw the sword                    PT Short Term Goals - 10/28/20 1202      PT SHORT TERM GOAL #1   Title be independent in initial HEP    Status Achieved      PT SHORT TERM GOAL #2   Title improve strength to perform 5x sit to stand in < or = to 18 seconds to improve safety    Status Achieved      PT SHORT TERM GOAL #3   Title Improve TUG test to 13 sec or less for improved safety and reduced fall risk    Status Achieved             PT Long Term Goals - 10/28/20 1203      PT LONG TERM GOAL #1   Title be independent in advanced HEP    Status On-going      PT LONG TERM GOAL #2   Title Pt will be able to perform multi-directional stepping and reaching strategies without LOB    Status On-going      PT LONG TERM GOAL #3   Title improve LE strength and endurance to walk for 10 minutes with min symptom aggravation to improve endurance in the community    Baseline walked in/out of hockey game without pain, revised to 10'  vs 30', will mark "Achieved" if Pt performs 2 x week x 10' for 2 consecutive weeks    Status Revised      PT LONG TERM GOAL #4   Title improve 5x sit to stand to < or = to 16 seconds to reduce falls risk    Baseline 15    Status  Achieved      PT LONG TERM GOAL #5   Title Pt will demo improved bed mobility with min use of UEs to reduce pain with rolling Lt/Rt in bed at night.    Status On-going                 Plan - 10/28/20 1236    Clinical Impression Statement Pt reported he was able to walk into hockey game x 10' on gravel and pavement last week without pain of difficulty.  He did report he was able to sit x 2 periods of hockey game before needing help to stand secondary to stiffness.  He arrived with report of 2 days of little to no pain, rating back pain 1/10 at most today.  Knee strength has improved bil to 4+/5.  He demos improved use of core with bed mobility but does need cueing to remind him of this.  He improved TUG time to 8" today and 5x sit to stand to 15", meeting LTGs.  He demos slightly improved ROM for trunk flexion today, limited by hamstring flexibility.  He continues to have some rigid movement patterns of trunk and benefits from spinal elongation and strengthening in supine.  He has not started walking for exercise but did a 5' walk test outdoor on level surface and covered 702' without pain.  Improved arm swing noted by PT.  PT revised goal to 10' of walking for community access (vs 30 min) and encouraged Pt to try 2x/week x 10' walks.  Pt will continue to benefit from skilled PT along POC.    Comorbidities depression, spinal stenosis L2/3, schizophrenia, history of falls    Rehab Potential Good    PT Frequency 2x / week    PT Duration 12 weeks    PT Treatment/Interventions ADLs/Self Care Home Management;Aquatic Therapy;Cryotherapy;Electrical Stimulation;Moist Heat;Iontophoresis 4mg /ml Dexamethasone;Neuromuscular re-education;Balance training;Therapeutic exercise;Therapeutic activities;Functional mobility training;Stair training;Gait training;Patient/family education;Manual techniques;Passive range of motion;Dry needling;Joint Manipulations;Spinal Manipulations    PT Next Visit Plan continue gait  endurance (did 5' walk outdoor last time covering 702'), NuStep, bed mobility with core cue, step ups, supine ther ex for trunk elongation and postural strength    PT Home Exercise Plan Access Code: [email or web address]    Consulted and Agree with Plan of Care Patient           Patient will benefit from skilled therapeutic intervention in order to improve the following deficits and impairments:     Visit Diagnosis: Chronic bilateral low back pain with right-sided sciatica  Muscle weakness (generalized)  Other abnormalities of gait and mobility  Chronic pain of left knee  Chronic pain of right knee     Problem List Patient Active Problem List   Diagnosis Date Noted  . Left knee pain 07/23/2020  . Spinal stenosis of lumbar region 01/16/2020  . Chronic lumbar radiculopathy 12/08/2019  . Hyperlipidemia associated with type 2 diabetes mellitus ([place]) 12/08/2019  . Dizziness 11/03/2019  . Paronychia of left index finger 05/19/2019  . Fall at home 11/24/2018  . Rosacea 10/24/2018  . Loss of weight 09/09/2018  . Schizoaffective disorder, depressive type ([place]) 04/08/2018  .  Cough 10/20/2017  . Contact dermatitis 04/16/2017  . Varicose veins of bilateral lower extremities with other complications 0000000  . Erectile dysfunction 04/26/2015  . Hypothyroidism   . Seizures ([place])   . Persistent atrial fibrillation ([place])   . Obesity, Class III, BMI 40-49.9 (morbid obesity) ([place])   . Acute right-sided low back pain   . Seborrheic dermatitis 02/10/2007  . T2DM (type 2 diabetes mellitus) ([place]) 02/10/2007    Baruch Merl, PT 10/28/20 12:46 PM   [facility] [address] [place], [place], [address] Phone: [phone]   Fax:  [phone]  Name: [name] MRN: [id number] Date of Birth: 03-21-1961

## 2020-10-30 ENCOUNTER — Ambulatory Visit: Payer: Medicare Other | Admitting: Physical Therapy

## 2020-10-30 ENCOUNTER — Other Ambulatory Visit: Payer: Self-pay

## 2020-10-30 ENCOUNTER — Encounter: Payer: Self-pay | Admitting: Physical Therapy

## 2020-10-30 DIAGNOSIS — R2689 Other abnormalities of gait and mobility: Secondary | ICD-10-CM

## 2020-10-30 DIAGNOSIS — G8929 Other chronic pain: Secondary | ICD-10-CM

## 2020-10-30 DIAGNOSIS — M5441 Lumbago with sciatica, right side: Secondary | ICD-10-CM | POA: Diagnosis not present

## 2020-10-30 DIAGNOSIS — M25561 Pain in right knee: Secondary | ICD-10-CM

## 2020-10-30 DIAGNOSIS — M6281 Muscle weakness (generalized): Secondary | ICD-10-CM

## 2020-10-30 NOTE — Therapy (Signed)
[facility] [address] [place], [place], [address] Phone: [phone]   Fax:  [phone]  Physical Therapy Treatment  Patient Details  Name: [name] MRN: [id number] Date of Birth: [birth year] Referring Provider (PT): Marybelle Killings, MD   Encounter Date: 10/30/2020   PT End of Session - 10/30/20 1203    Visit Number 11    Date for PT Re-Evaluation 11/27/20    Authorization Type Medicare Part A and B - KX at visit 15    Progress Note Due on Visit 20    PT Start Time 1147    PT Stop Time 1230    PT Time Calculation (min) 43 min    Activity Tolerance Patient tolerated treatment well    Behavior During Therapy [facility] for tasks assessed/performed           Past Medical History:  Diagnosis Date  . A-fib ([place])   . Arrhythmia   . Chronic a-fib ([place])   . Closed fracture of left distal radius   . Depression   . Depression   . Hypothyroidism   . Low back pain   . Morbid obesity, BMI unknown ([place])   . Osteoarthritis    oa in bilateral knees  . Schizophrenia ([place])   . Seizures ([place])   . Thyroid disease   . Varicose veins     Past Surgical History:  Procedure Laterality Date  . ACHILLES TENDON REPAIR  approx. 2004   right foot  . CATARACT EXTRACTION     bilateral  . OPEN REDUCTION INTERNAL FIXATION (ORIF) DISTAL RADIAL FRACTURE Left 07/21/2018   Procedure: OPEN REDUCTION INTERNAL FIXATION (ORIF) DISTAL RADIAL FRACTURE;  Surgeon: [name], MD;  Location: [facility];  Service: Orthopedics;  Laterality: Left;  . stab phlebectomy  Right 11/09/2016   stab phlebectomy R leg by Tinnie Gens MD  . TOOTH EXTRACTION      There were no vitals filed for this visit.   Subjective Assessment - 10/30/20 1149    Subjective Pt states he is feeling great.  Had a brief sieze in low back after last session but didn't last. He was able to walk that same day in his parking lot x 15-20 min.  Lt knee hurt  in AM today but now feels better.    Pertinent History PMH: depression, spinal stenosis L2/3, schizophrenia, history of falls    Limitations Standing;Walking;Other (comment)    How long can you stand comfortably? 30 min    How long can you walk comfortably? 30 min    Diagnostic tests M54.16 (ICD-10-CM) - Chronic lumbar radiculopathy  M25.561,M25.562,G89.29 (ICD-10-CM) - Chronic pain of both knees    Patient Stated Goals decrease back pain with bed mobility, balance/strength, stairs    Currently in Pain? No/denies                             [facility] Adult PT Treatment/Exercise - 10/30/20 0001      Ambulation/Gait   Ambulation/Gait Yes    Gait Comments outdoor ambulation x 7', improved arm swing and step length with occassional cueing reminder      Lumbar Exercises: Aerobic   Nustep L3 x 10 PT present to discuss status and monitor pain      Knee/Hip Exercises: Supine   Heel Slides Right;Left;1 set;10 reps    Heel Slides Limitations foot on slider, VC for ab indrawing  Other Supine Knee/Hip Exercises leg lengthener 3x5" each    Other Supine Knee/Hip Exercises isometric hip extensions into mat  5x5" bil      Shoulder Exercises: Supine   Horizontal ABduction Strengthening;Theraband;20 reps    Theraband Level (Shoulder Horizontal ABduction) Level 3 (Green)    Flexion Limitations holding dowel for trunk elongation   10 reps   Diagonals Strengthening;Right;Left;5 reps    Theraband Level (Shoulder Diagonals) Level 3 (Green)    Diagonals Limitations draw the sword                    PT Short Term Goals - 10/28/20 1202      PT SHORT TERM GOAL #1   Title be independent in initial HEP    Status Achieved      PT SHORT TERM GOAL #2   Title improve strength to perform 5x sit to stand in < or = to 18 seconds to improve safety    Status Achieved      PT SHORT TERM GOAL #3   Title Improve TUG test to 13 sec or less for improved safety and reduced fall risk     Status Achieved             PT Long Term Goals - 10/28/20 1203      PT LONG TERM GOAL #1   Title be independent in advanced HEP    Status On-going      PT LONG TERM GOAL #2   Title Pt will be able to perform multi-directional stepping and reaching strategies without LOB    Status On-going      PT LONG TERM GOAL #3   Title improve LE strength and endurance to walk for 10 minutes with min symptom aggravation to improve endurance in the community    Baseline walked in/out of hockey game without pain, revised to 10' vs 30', will mark "Achieved" if Pt performs 2 x week x 10' for 2 consecutive weeks    Status Revised      PT LONG TERM GOAL #4   Title improve 5x sit to stand to < or = to 16 seconds to reduce falls risk    Baseline 15    Status Achieved      PT LONG TERM GOAL #5   Title Pt will demo improved bed mobility with min use of UEs to reduce pain with rolling Lt/Rt in bed at night.    Status On-going                 Plan - 10/30/20 1203    Clinical Impression Statement Pt had a brief sieze of lumbar pain after last session when he got home but it didn't last.  He was able to walk x 15-20 min in his parking lot that same day with good tolerance.  He arrived feeling "great" and wanted to walk outside for his warm up.  He needed intermittent cueing to increase his "swagger" to include arm swing which naturally improves his step length.  Pt navigated turns and changes in sidewalk levels without LOB.  PT provided close supervision for safety.  Pt continues to benefit from NuStep, spine elongation stretching, and postural strength.  Continue along POC.    Comorbidities depression, spinal stenosis L2/3, schizophrenia, history of falls    PT Frequency 2x / week    PT Duration 12 weeks    PT Treatment/Interventions ADLs/Self Care Home Management;Aquatic Therapy;Cryotherapy;Electrical Stimulation;Moist Heat;Iontophoresis 4mg /ml Dexamethasone;Neuromuscular re-education;Balance  training;Therapeutic  exercise;Therapeutic activities;Functional mobility training;Stair training;Gait training;Patient/family education;Manual techniques;Passive range of motion;Dry needling;Joint Manipulations;Spinal Manipulations    PT Next Visit Plan f/u on continued compliance with walking for exercise for LTG (2x/week x 10+ min), NuStep, supine ther ex for trunk elongation and postural strength    PT Home Exercise Plan Access Code: [email or web address]    Consulted and Agree with Plan of Care Patient           Patient will benefit from skilled therapeutic intervention in order to improve the following deficits and impairments:     Visit Diagnosis: Chronic bilateral low back pain with right-sided sciatica  Muscle weakness (generalized)  Other abnormalities of gait and mobility  Chronic pain of left knee  Chronic pain of right knee     Problem List Patient Active Problem List   Diagnosis Date Noted  . Left knee pain 07/23/2020  . Spinal stenosis of lumbar region 01/16/2020  . Chronic lumbar radiculopathy 12/08/2019  . Hyperlipidemia associated with type 2 diabetes mellitus ([place]) 12/08/2019  . Dizziness 11/03/2019  . Paronychia of left index finger 05/19/2019  . Fall at home 11/24/2018  . Rosacea 10/24/2018  . Loss of weight 09/09/2018  . Schizoaffective disorder, depressive type ([place]) 04/08/2018  . Cough 10/20/2017  . Contact dermatitis 04/16/2017  . Varicose veins of bilateral lower extremities with other complications 54/03/8118  . Erectile dysfunction 04/26/2015  . Hypothyroidism   . Seizures ([place])   . Persistent atrial fibrillation ([place])   . Obesity, Class III, BMI 40-49.9 (morbid obesity) ([place])   . Acute right-sided low back pain   . Seborrheic dermatitis 02/10/2007  . T2DM (type 2 diabetes mellitus) ([place]) 02/10/2007    Baruch Merl, PT 10/30/20 12:36 PM   [facility] [address] [place],  [place], [address] Phone: [phone]   Fax:  [phone]  Name: OSAZE HUBBERT MRN: [id number] Date of Birth: 06-09-1961

## 2020-11-04 ENCOUNTER — Encounter: Payer: Medicare Other | Admitting: Physical Therapy

## 2020-11-05 ENCOUNTER — Telehealth (HOSPITAL_COMMUNITY): Payer: Self-pay | Admitting: *Deleted

## 2020-11-05 NOTE — Telephone Encounter (Signed)
He is prescribed oral or topical? Yes oral Accutane can cause worsening of depression.

## 2020-11-05 NOTE — Telephone Encounter (Signed)
Pt called with concerns and questions about starting Accutane. Pt has read the s/e and is concerned about increasing depression and si so is seeking your advice on whether or not to take it. Please review and advise.

## 2020-11-05 NOTE — Telephone Encounter (Signed)
He's not sure

## 2020-11-06 ENCOUNTER — Ambulatory Visit: Payer: Medicare Other | Attending: Orthopaedic Surgery | Admitting: Physical Therapy

## 2020-11-06 ENCOUNTER — Encounter: Payer: Self-pay | Admitting: Physical Therapy

## 2020-11-06 ENCOUNTER — Other Ambulatory Visit: Payer: Self-pay

## 2020-11-06 DIAGNOSIS — M6281 Muscle weakness (generalized): Secondary | ICD-10-CM | POA: Insufficient documentation

## 2020-11-06 DIAGNOSIS — M25561 Pain in right knee: Secondary | ICD-10-CM | POA: Insufficient documentation

## 2020-11-06 DIAGNOSIS — M5441 Lumbago with sciatica, right side: Secondary | ICD-10-CM | POA: Diagnosis present

## 2020-11-06 DIAGNOSIS — R2689 Other abnormalities of gait and mobility: Secondary | ICD-10-CM | POA: Insufficient documentation

## 2020-11-06 DIAGNOSIS — M25562 Pain in left knee: Secondary | ICD-10-CM | POA: Insufficient documentation

## 2020-11-06 DIAGNOSIS — G8929 Other chronic pain: Secondary | ICD-10-CM | POA: Diagnosis present

## 2020-11-06 NOTE — Therapy (Signed)
[facility] [address] [place], [place], [address] Phone: [phone]   Fax:  [phone]  Physical Therapy Treatment  Patient Details  Name: [name] MRN: [id number] Date of Birth: [birth year] Referring Provider (PT): Marybelle Killings, MD   Encounter Date: 11/06/2020   PT End of Session - 11/06/20 1235    Visit Number 12    Date for PT Re-Evaluation 11/27/20    Authorization Type Medicare Part A and B - KX at visit 15    Progress Note Due on Visit 20    PT Start Time 1230    PT Stop Time 1308    PT Time Calculation (min) 38 min    Activity Tolerance Patient tolerated treatment well    Behavior During Therapy [facility] for tasks assessed/performed           Past Medical History:  Diagnosis Date  . A-fib ([place])   . Arrhythmia   . Chronic a-fib ([place])   . Closed fracture of left distal radius   . Depression   . Depression   . Hypothyroidism   . Low back pain   . Morbid obesity, BMI unknown ([place])   . Osteoarthritis    oa in bilateral knees  . Schizophrenia ([place])   . Seizures ([place])   . Thyroid disease   . Varicose veins     Past Surgical History:  Procedure Laterality Date  . ACHILLES TENDON REPAIR  approx. 2004   right foot  . CATARACT EXTRACTION     bilateral  . OPEN REDUCTION INTERNAL FIXATION (ORIF) DISTAL RADIAL FRACTURE Left 07/21/2018   Procedure: OPEN REDUCTION INTERNAL FIXATION (ORIF) DISTAL RADIAL FRACTURE;  Surgeon: [name], MD;  Location: [place];  Service: Orthopedics;  Laterality: Left;  . stab phlebectomy  Right 11/09/2016   stab phlebectomy R leg by Tinnie Gens MD  . TOOTH EXTRACTION      There were no vitals filed for this visit.   Subjective Assessment - 11/06/20 1236    Subjective I had stomach trouble early in the week which disruptted my walking. Sometimes I wake up and my RT thigh is really hurting.    Pertinent History PMH: depression, spinal stenosis  L2/3, schizophrenia, history of falls    Diagnostic tests M54.16 (ICD-10-CM) - Chronic lumbar radiculopathy  M25.561,M25.562,G89.29 (ICD-10-CM) - Chronic pain of both knees    Patient Stated Goals decrease back pain with bed mobility, balance/strength, stairs    Currently in Pain? No/denies    Multiple Pain Sites No                             [facility] Adult PT Treatment/Exercise - 11/06/20 0001      Lumbar Exercises: Stretches   Lower Trunk Rotation 5 reps      Lumbar Exercises: Aerobic   Nustep L3 x 10 PTA present to discuss status and monitor pain      Knee/Hip Exercises: Stretches   Other Knee/Hip Stretches Semi sidelying quad stretch 30 sec Bil      Knee/Hip Exercises: Supine   Heel Slides Right;Left;1 set;10 reps    Heel Slides Limitations foot on slider, VC for ab indrawing    Other Supine Knee/Hip Exercises leg lengthener 5x5" each    Other Supine Knee/Hip Exercises isometric hip extensions into mat  5x5" bil      Shoulder Exercises: Supine   Horizontal ABduction Strengthening;Theraband;20 reps  Theraband Level (Shoulder Horizontal ABduction) Level 3 (Green)    Flexion Limitations holding dowel for trunk elongation   10 reps   Diagonals Strengthening;Both;10 reps    Theraband Level (Shoulder Diagonals) Level 3 (Green)    Diagonals Limitations draw the sword                    PT Short Term Goals - 10/28/20 1202      PT SHORT TERM GOAL #1   Title be independent in initial HEP    Status Achieved      PT SHORT TERM GOAL #2   Title improve strength to perform 5x sit to stand in < or = to 18 seconds to improve safety    Status Achieved      PT SHORT TERM GOAL #3   Title Improve TUG test to 13 sec or less for improved safety and reduced fall risk    Status Achieved             PT Long Term Goals - 10/28/20 1203      PT LONG TERM GOAL #1   Title be independent in advanced HEP    Status On-going      PT LONG TERM GOAL #2   Title  Pt will be able to perform multi-directional stepping and reaching strategies without LOB    Status On-going      PT LONG TERM GOAL #3   Title improve LE strength and endurance to walk for 10 minutes with min symptom aggravation to improve endurance in the community    Baseline walked in/out of hockey game without pain, revised to 10' vs 30', will mark "Achieved" if Pt performs 2 x week x 10' for 2 consecutive weeks    Status Revised      PT LONG TERM GOAL #4   Title improve 5x sit to stand to < or = to 16 seconds to reduce falls risk    Baseline 15    Status Achieved      PT LONG TERM GOAL #5   Title Pt will demo improved bed mobility with min use of UEs to reduce pain with rolling Lt/Rt in bed at night.    Status On-going                 Plan - 11/06/20 1235    Clinical Impression Statement Pt has not been able to keept up his walking due to stomach issues at the beginning of the week. Pt reports he is bothered at night by a pain in his RT thigh. He is unsure why. We discussed stretching before laying down/going to bed at night and see if that is helpful. Pt agreed to try.    Personal Factors and Comorbidities Comorbidity 1;Comorbidity 2;Comorbidity 3+;Age;Fitness;Sex    Comorbidities depression, spinal stenosis L2/3, schizophrenia, history of falls    Examination-Activity Limitations Locomotion Level;Transfers;Bed Mobility;Bend;Stairs;Stand;Squat;Lift    Examination-Participation Restrictions Community Activity;Shop;Cleaning    Stability/Clinical Decision Making Stable/Uncomplicated    Rehab Potential Good    PT Frequency 2x / week    PT Duration 12 weeks    PT Treatment/Interventions ADLs/Self Care Home Management;Aquatic Therapy;Cryotherapy;Electrical Stimulation;Moist Heat;Iontophoresis 4mg /ml Dexamethasone;Neuromuscular re-education;Balance training;Therapeutic exercise;Therapeutic activities;Functional mobility training;Stair training;Gait training;Patient/family  education;Manual techniques;Passive range of motion;Dry needling;Joint Manipulations;Spinal Manipulations    PT Next Visit Plan f/u on continued compliance with walking for exercise for LTG (2x/week x 10+ min), NuStep, supine ther ex for trunk elongation and postural strength    PT  Home Exercise Plan Access Code: [email or web address]    Consulted and Agree with Plan of Care Patient           Patient will benefit from skilled therapeutic intervention in order to improve the following deficits and impairments:  Abnormal gait,Decreased range of motion,Difficulty walking,Obesity,Decreased endurance,Decreased activity tolerance,Pain,Decreased balance,Impaired flexibility,Improper body mechanics,Decreased mobility,Decreased strength  Visit Diagnosis: Chronic bilateral low back pain with right-sided sciatica  Muscle weakness (generalized)  Other abnormalities of gait and mobility  Chronic pain of left knee  Chronic pain of right knee     Problem List Patient Active Problem List   Diagnosis Date Noted  . Left knee pain 07/23/2020  . Spinal stenosis of lumbar region 01/16/2020  . Chronic lumbar radiculopathy 12/08/2019  . Hyperlipidemia associated with type 2 diabetes mellitus ([place]) 12/08/2019  . Dizziness 11/03/2019  . Paronychia of left index finger 05/19/2019  . Fall at home 11/24/2018  . Rosacea 10/24/2018  . Loss of weight 09/09/2018  . Schizoaffective disorder, depressive type ([place]) 04/08/2018  . Cough 10/20/2017  . Contact dermatitis 04/16/2017  . Varicose veins of bilateral lower extremities with other complications 45/85/9292  . Erectile dysfunction 04/26/2015  . Hypothyroidism   . Seizures ([place])   . Persistent atrial fibrillation ([place])   . Obesity, Class III, BMI 40-49.9 (morbid obesity) ([place])   . Acute right-sided low back pain   . Seborrheic dermatitis 02/10/2007  . T2DM (type 2 diabetes mellitus) ([place]) 02/10/2007    Cianna Kasparian, PTA 11/06/2020, 1:04 PM  [facility] [address] [place], [place], [address] Phone: [phone]   Fax:  [phone]  Name: [name] MRN: [id number] Date of Birth: 03-19-1961

## 2020-11-11 ENCOUNTER — Encounter: Payer: Self-pay | Admitting: Physical Therapy

## 2020-11-11 ENCOUNTER — Ambulatory Visit: Payer: Medicare Other | Admitting: Physical Therapy

## 2020-11-11 ENCOUNTER — Other Ambulatory Visit: Payer: Self-pay

## 2020-11-11 DIAGNOSIS — R2689 Other abnormalities of gait and mobility: Secondary | ICD-10-CM

## 2020-11-11 DIAGNOSIS — G8929 Other chronic pain: Secondary | ICD-10-CM

## 2020-11-11 DIAGNOSIS — M5441 Lumbago with sciatica, right side: Secondary | ICD-10-CM | POA: Diagnosis not present

## 2020-11-11 DIAGNOSIS — M6281 Muscle weakness (generalized): Secondary | ICD-10-CM

## 2020-11-11 NOTE — Therapy (Signed)
[facility] [address] [place], [place], [address] Phone: [phone]   Fax:  [phone]  Physical Therapy Treatment  Patient Details  Name: [name] MRN: [id number] Date of Birth: 1960/09/17 Referring Provider (PT): Marybelle Killings, MD   Encounter Date: 11/11/2020   PT End of Session - 11/11/20 1237    Visit Number 13    Date for PT Re-Evaluation 11/27/20    Authorization Type Medicare Part A and B - KX at visit 15    Progress Note Due on Visit 20    PT Start Time 1236    PT Stop Time 1315    PT Time Calculation (min) 39 min    Activity Tolerance Patient tolerated treatment well    Behavior During Therapy [facility] for tasks assessed/performed           Past Medical History:  Diagnosis Date  . A-fib ([place])   . Arrhythmia   . Chronic a-fib ([place])   . Closed fracture of left distal radius   . Depression   . Depression   . Hypothyroidism   . Low back pain   . Morbid obesity, BMI unknown ([place])   . Osteoarthritis    oa in bilateral knees  . Schizophrenia ([place])   . Seizures ([place])   . Thyroid disease   . Varicose veins     Past Surgical History:  Procedure Laterality Date  . ACHILLES TENDON REPAIR  approx. 2004   right foot  . CATARACT EXTRACTION     bilateral  . OPEN REDUCTION INTERNAL FIXATION (ORIF) DISTAL RADIAL FRACTURE Left 07/21/2018   Procedure: OPEN REDUCTION INTERNAL FIXATION (ORIF) DISTAL RADIAL FRACTURE;  Surgeon: [name], MD;  Location: [place];  Service: Orthopedics;  Laterality: Left;  . stab phlebectomy  Right 11/09/2016   stab phlebectomy R leg by Tinnie Gens MD  . TOOTH EXTRACTION      There were no vitals filed for this visit.   Subjective Assessment - 11/11/20 1240    Subjective Stretching my thigh before bed really helped, I did not wake up with thigh pain.    Pertinent History PMH: depression, spinal stenosis L2/3, schizophrenia, history of falls     Diagnostic tests M54.16 (ICD-10-CM) - Chronic lumbar radiculopathy  M25.561,M25.562,G89.29 (ICD-10-CM) - Chronic pain of both knees    Currently in Pain? No/denies    Aggravating Factors  If i don't stretch    Pain Relieving Factors laying flat, stretching    Multiple Pain Sites No                             [facility] Adult PT Treatment/Exercise - 11/11/20 0001      Lumbar Exercises: Stretches   Lower Trunk Rotation --   10x     Lumbar Exercises: Aerobic   Nustep L3 x 10 PTA present to discuss status and monitor pain      Lumbar Exercises: Supine   Other Supine Lumbar Exercises leg lengthener 10x5", whole leg press into mat table 10x5", TC needed for leg lengthener      Knee/Hip Exercises: Stretches   Other Knee/Hip Stretches Semi sidelying quad stretch 30 sec 2x Bil      Shoulder Exercises: Supine   Horizontal ABduction Strengthening;Both;10 reps;Theraband    Theraband Level (Shoulder Horizontal ABduction) Level 4 (Blue)    Flexion Limitations holding dowel for trunk elongation   10 reps  Diagonals Strengthening;Both;5 reps    Theraband Level (Shoulder Diagonals) Level 4 (Blue)    Diagonals Limitations draw the sword                    PT Short Term Goals - 10/28/20 1202      PT SHORT TERM GOAL #1   Title be independent in initial HEP    Status Achieved      PT SHORT TERM GOAL #2   Title improve strength to perform 5x sit to stand in < or = to 18 seconds to improve safety    Status Achieved      PT SHORT TERM GOAL #3   Title Improve TUG test to 13 sec or less for improved safety and reduced fall risk    Status Achieved             PT Long Term Goals - 10/28/20 1203      PT LONG TERM GOAL #1   Title be independent in advanced HEP    Status On-going      PT LONG TERM GOAL #2   Title Pt will be able to perform multi-directional stepping and reaching strategies without LOB    Status On-going      PT LONG TERM GOAL #3   Title improve  LE strength and endurance to walk for 10 minutes with min symptom aggravation to improve endurance in the community    Baseline walked in/out of hockey game without pain, revised to 10' vs 30', will mark "Achieved" if Pt performs 2 x week x 10' for 2 consecutive weeks    Status Revised      PT LONG TERM GOAL #4   Title improve 5x sit to stand to < or = to 16 seconds to reduce falls risk    Baseline 15    Status Achieved      PT LONG TERM GOAL #5   Title Pt will demo improved bed mobility with min use of UEs to reduce pain with rolling Lt/Rt in bed at night.    Status On-going                 Plan - 11/11/20 1247    Clinical Impression Statement Pt reports success stretching his quads prior to going to bed and not awakening due to quad pain. He will continue to do this with both legs. Pt walked to the mailbox a few times since last PT visit, but no 10 min walks. Pt does report going to a hockey game and doing well with that walk in & out of the stadium.    Personal Factors and Comorbidities Comorbidity 1;Comorbidity 2;Comorbidity 3+;Age;Fitness;Sex    Comorbidities depression, spinal stenosis L2/3, schizophrenia, history of falls    Examination-Activity Limitations Locomotion Level;Transfers;Bed Mobility;Bend;Stairs;Stand;Squat;Lift    Examination-Participation Restrictions Community Activity;Shop;Cleaning    Stability/Clinical Decision Making Stable/Uncomplicated    Rehab Potential Good    PT Frequency 2x / week    PT Duration 12 weeks    PT Treatment/Interventions ADLs/Self Care Home Management;Aquatic Therapy;Cryotherapy;Electrical Stimulation;Moist Heat;Iontophoresis 4mg /ml Dexamethasone;Neuromuscular re-education;Balance training;Therapeutic exercise;Therapeutic activities;Functional mobility training;Stair training;Gait training;Patient/family education;Manual techniques;Passive range of motion;Dry needling;Joint Manipulations;Spinal Manipulations    PT Next Visit Plan Discuss  DC next visit to HEP, pt does not have any more scheduled appts after Wednesday.    PT Home Exercise Plan Access Code: [email or web address]    Consulted and Agree with Plan of Care Patient  Patient will benefit from skilled therapeutic intervention in order to improve the following deficits and impairments:  Abnormal gait,Decreased range of motion,Difficulty walking,Obesity,Decreased endurance,Decreased activity tolerance,Pain,Decreased balance,Impaired flexibility,Improper body mechanics,Decreased mobility,Decreased strength  Visit Diagnosis: Chronic bilateral low back pain with right-sided sciatica  Muscle weakness (generalized)  Other abnormalities of gait and mobility  Chronic pain of left knee  Chronic pain of right knee     Problem List Patient Active Problem List   Diagnosis Date Noted  . Left knee pain 07/23/2020  . Spinal stenosis of lumbar region 01/16/2020  . Chronic lumbar radiculopathy 12/08/2019  . Hyperlipidemia associated with type 2 diabetes mellitus ([place]) 12/08/2019  . Dizziness 11/03/2019  . Paronychia of left index finger 05/19/2019  . Fall at home 11/24/2018  . Rosacea 10/24/2018  . Loss of weight 09/09/2018  . Schizoaffective disorder, depressive type ([place]) 04/08/2018  . Cough 10/20/2017  . Contact dermatitis 04/16/2017  . Varicose veins of bilateral lower extremities with other complications 94/70/9628  . Erectile dysfunction 04/26/2015  . Hypothyroidism   . Seizures ([place])   . Persistent atrial fibrillation ([place])   . Obesity, Class III, BMI 40-49.9 (morbid obesity) ([place])   . Acute right-sided low back pain   . Seborrheic dermatitis 02/10/2007  . T2DM (type 2 diabetes mellitus) ([place]) 02/10/2007    Arriyah Madej, PTA 11/11/2020, 1:06 PM  [facility] [address] [place], [place], [address] Phone: [phone]   Fax:  [phone]  Name: UBALDO DAYWALT MRN: [id number] Date of  Birth: [birth year]

## 2020-11-13 ENCOUNTER — Ambulatory Visit: Payer: Medicare Other | Admitting: Physical Therapy

## 2020-11-13 ENCOUNTER — Telehealth: Payer: Self-pay | Admitting: Physical Therapy

## 2020-11-13 NOTE — Telephone Encounter (Signed)
Pt no-showed to his PT appointment today, 11/13/20 at 11:45am.  PT left a voicemail for Pt to return call to follow up on his schedule moving forward.  Jesyca Weisenburger, PT 11/13/20 12:07 PM

## 2020-11-19 ENCOUNTER — Encounter: Payer: Medicare Other | Admitting: Physical Therapy

## 2020-11-25 ENCOUNTER — Ambulatory Visit: Payer: Medicare Other | Admitting: Physical Therapy

## 2020-11-25 ENCOUNTER — Encounter: Payer: Self-pay | Admitting: Physical Therapy

## 2020-11-25 ENCOUNTER — Other Ambulatory Visit: Payer: Self-pay

## 2020-11-25 DIAGNOSIS — M6281 Muscle weakness (generalized): Secondary | ICD-10-CM

## 2020-11-25 DIAGNOSIS — R2689 Other abnormalities of gait and mobility: Secondary | ICD-10-CM

## 2020-11-25 DIAGNOSIS — M5441 Lumbago with sciatica, right side: Secondary | ICD-10-CM | POA: Diagnosis not present

## 2020-11-25 DIAGNOSIS — M25562 Pain in left knee: Secondary | ICD-10-CM

## 2020-11-25 DIAGNOSIS — M25561 Pain in right knee: Secondary | ICD-10-CM

## 2020-11-25 DIAGNOSIS — G8929 Other chronic pain: Secondary | ICD-10-CM

## 2020-11-25 NOTE — Therapy (Addendum)
[facility] [address] [place], [place], [address] Phone: [phone]   Fax:  [phone]  Physical Therapy Treatment  Patient Details  Name: [name] MRN: [id number] Date of Birth: [birth year] Referring Provider (PT): Marybelle Killings, MD   Encounter Date: 11/25/2020   PT End of Session - 11/25/20 1228    Visit Number 14    Date for PT Re-Evaluation 11/27/20    Authorization Type Medicare Part A and B - KX at visit 15    Progress Note Due on Visit 20    PT Start Time 1222    PT Stop Time 1305    PT Time Calculation (min) 43 min    Activity Tolerance Patient tolerated treatment well    Behavior During Therapy [facility] for tasks assessed/performed           Past Medical History:  Diagnosis Date  . A-fib ([place])   . Arrhythmia   . Chronic a-fib ([place])   . Closed fracture of left distal radius   . Depression   . Depression   . Hypothyroidism   . Low back pain   . Morbid obesity, BMI unknown ([place])   . Osteoarthritis    oa in bilateral knees  . Schizophrenia ([place])   . Seizures ([place])   . Thyroid disease   . Varicose veins     Past Surgical History:  Procedure Laterality Date  . ACHILLES TENDON REPAIR  approx. 2004   right foot  . CATARACT EXTRACTION     bilateral  . OPEN REDUCTION INTERNAL FIXATION (ORIF) DISTAL RADIAL FRACTURE Left 07/21/2018   Procedure: OPEN REDUCTION INTERNAL FIXATION (ORIF) DISTAL RADIAL FRACTURE;  Surgeon: [name], MD;  Location: [facility];  Service: Orthopedics;  Laterality: Left;  . stab phlebectomy  Right 11/09/2016   stab phlebectomy R leg by Tinnie Gens MD  . TOOTH EXTRACTION      There were no vitals filed for this visit.   Subjective Assessment - 11/25/20 1228    Subjective My right knee is bothering me today, unsure why.    Pertinent History PMH: depression, spinal stenosis L2/3, schizophrenia, history of falls    Diagnostic tests M54.16  (ICD-10-CM) - Chronic lumbar radiculopathy  M25.561,M25.562,G89.29 (ICD-10-CM) - Chronic pain of both knees    Patient Stated Goals decrease back pain with bed mobility, balance/strength, stairs    Currently in Pain? Yes    Pain Score 8     Pain Location Knee    Pain Orientation Right    Pain Descriptors / Indicators Sore;Sharp    Aggravating Factors  Unsure why knee is hurting today.    Pain Relieving Factors laying flat and stretching    Multiple Pain Sites No              [facility] PT Assessment - 11/25/20 0001      PROM   Overall PROM Comments hips limited bil 50%      Strength   Overall Strength Comments knees 4+/5 bil, hips 4/5 bil, functional trunk strength limited for bed mobility      Flexibility   Hamstrings limited 50% bil      Transfers   Five time sit to stand comments  15 sec      Standardized Balance Assessment   Standardized Balance Assessment Five Times Sit to Stand;Timed Up and Go Test    Five times sit to stand comments  15 sec  Timed Up and Go Test   TUG Normal TUG    Normal TUG (seconds) 8                         [facility] Adult PT Treatment/Exercise - 11/25/20 0001      Lumbar Exercises: Aerobic   Nustep L1 ( dt knee pain today) x 8 min      Lumbar Exercises: Seated   Other Seated Lumbar Exercises Ankle pumps Bil 10x                  PT Education - 11/25/20 1257    Education Details Review of what things specifically made him better and the keys to his success to maintain his progress, HEP final    Person(s) Educated Patient    Methods Explanation;Demonstration;Handout    Comprehension Returned demonstration;Verbalized understanding            PT Short Term Goals - 10/28/20 1202      PT SHORT TERM GOAL #1   Title be independent in initial HEP    Status Achieved      PT SHORT TERM GOAL #2   Title improve strength to perform 5x sit to stand in < or = to 18 seconds to improve safety    Status Achieved      PT  SHORT TERM GOAL #3   Title Improve TUG test to 13 sec or less for improved safety and reduced fall risk    Status Achieved             PT Long Term Goals - 11/25/20 1235      PT LONG TERM GOAL #1   Title be independent in advanced HEP    Time 10    Period Weeks    Status Achieved      PT LONG TERM GOAL #2   Title Pt will be able to perform multi-directional stepping and reaching strategies without LOB    Time 12    Period Weeks    Status Achieved      PT LONG TERM GOAL #3   Title improve LE strength and endurance to walk for 10 minutes with min symptom aggravation to improve endurance in the community    Time 8    Period Weeks    Status Partially Met   If there is no hockey game or the weather is hot he has not walked. He does report improved compliance with walking.     PT LONG TERM GOAL #4   Title improve 5x sit to stand to < or = to 16 seconds to reduce falls risk    Baseline 15    Time 12    Period Weeks    Status Achieved      PT LONG TERM GOAL #5   Title Pt will demo improved bed mobility with min use of UEs to reduce pain with rolling Lt/Rt in bed at night.    Time 12    Period Weeks    Status Achieved   Essentially no pain or at least 80% les pain, still has to pull on headboard at times.                Plan - 11/25/20 1244    Clinical Impression Statement Pt arrives with complaints today of RT knee pain which he is unsure why today, possibly weather? This affected his ability to perform the Nustep which is typically a good machine  for him. PTA spent time making sure pt understood the reasons why he made such improvements ( balance, flexibility etc) and how to best maintain his progress. Pt verbally understood and added that he would try to get in the pool once it opens after [facility] Day and walk in the pool. PTA encouraged him to do so. All long terms are met.    Personal Factors and Comorbidities Comorbidity 1;Comorbidity 2;Comorbidity  3+;Age;Fitness;Sex    Comorbidities depression, spinal stenosis L2/3, schizophrenia, history of falls    Examination-Activity Limitations Locomotion Level;Transfers;Bed Mobility;Bend;Stairs;Stand;Squat;Lift    Examination-Participation Restrictions Community Activity;Shop;Cleaning    Stability/Clinical Decision Making Stable/Uncomplicated    Rehab Potential Good    PT Frequency 2x / week    PT Duration 12 weeks    PT Treatment/Interventions ADLs/Self Care Home Management;Aquatic Therapy;Cryotherapy;Electrical Stimulation;Moist Heat;Iontophoresis 4mg /ml Dexamethasone;Neuromuscular re-education;Balance training;Therapeutic exercise;Therapeutic activities;Functional mobility training;Stair training;Gait training;Patient/family education;Manual techniques;Passive range of motion;Dry needling;Joint Manipulations;Spinal Manipulations    PT Next Visit Plan DC to HEP    PT Home Exercise Plan Access Code: [email or web address]    Consulted and Agree with Plan of Care Patient           Patient will benefit from skilled therapeutic intervention in order to improve the following deficits and impairments:  Abnormal gait,Decreased range of motion,Difficulty walking,Obesity,Decreased endurance,Decreased activity tolerance,Pain,Decreased balance,Impaired flexibility,Improper body mechanics,Decreased mobility,Decreased strength  Visit Diagnosis: Chronic bilateral low back pain with right-sided sciatica  Muscle weakness (generalized)  Other abnormalities of gait and mobility  Chronic pain of left knee  Chronic pain of right knee     Problem List Patient Active Problem List   Diagnosis Date Noted  . Left knee pain 07/23/2020  . Spinal stenosis of lumbar region 01/16/2020  . Chronic lumbar radiculopathy 12/08/2019  . Hyperlipidemia associated with type 2 diabetes mellitus ([place]) 12/08/2019  . Dizziness 11/03/2019  . Paronychia of left index finger 05/19/2019  . Fall at home 11/24/2018  . Rosacea 10/24/2018   . Loss of weight 09/09/2018  . Schizoaffective disorder, depressive type ([place]) 04/08/2018  . Cough 10/20/2017  . Contact dermatitis 04/16/2017  . Varicose veins of bilateral lower extremities with other complications 47/03/2956  . Erectile dysfunction 04/26/2015  . Hypothyroidism   . Seizures ([place])   . Persistent atrial fibrillation ([place])   . Obesity, Class III, BMI 40-49.9 (morbid obesity) ([place])   . Acute right-sided low back pain   . Seborrheic dermatitis 02/10/2007  . T2DM (type 2 diabetes mellitus) ([place]) 02/10/2007    Zakyah Yanes, PTA 11/25/2020, 3:35 PM   PHYSICAL THERAPY DISCHARGE SUMMARY  Visits from Start of Care: 14  Current functional level related to goals / functional outcomes: See above   Remaining deficits: See above   Education / Equipment: HEP Plan: Patient agrees to discharge.  Patient goals were met. Patient is being discharged due to meeting the stated rehab goals.  ?????          Baruch Merl, PT 11/26/20 10:09 AM   [facility] [address] [place], [place], [address] Phone: [phone]   Fax:  [phone]  Name: [name] MRN: [id number] Date of Birth: 14-Apr-1961

## 2020-12-06 ENCOUNTER — Telehealth (INDEPENDENT_AMBULATORY_CARE_PROVIDER_SITE_OTHER): Payer: Medicare Other | Admitting: Psychiatry

## 2020-12-06 ENCOUNTER — Encounter (HOSPITAL_COMMUNITY): Payer: Self-pay | Admitting: Psychiatry

## 2020-12-06 ENCOUNTER — Other Ambulatory Visit: Payer: Self-pay

## 2020-12-06 DIAGNOSIS — F339 Major depressive disorder, recurrent, unspecified: Secondary | ICD-10-CM | POA: Diagnosis not present

## 2020-12-06 DIAGNOSIS — F419 Anxiety disorder, unspecified: Secondary | ICD-10-CM | POA: Diagnosis not present

## 2020-12-06 MED ORDER — DEPAKOTE ER 250 MG PO TB24: 500.0000 mg | ORAL_TABLET | Freq: Two times a day (BID) | ORAL | 0 refills | Status: DC

## 2020-12-06 MED ORDER — PERPHENAZINE 8 MG PO TABS: 24.0000 mg | ORAL_TABLET | Freq: Every day | ORAL | 0 refills | Status: DC

## 2020-12-06 MED ORDER — PHENELZINE SULFATE 15 MG PO TABS: 45.0000 mg | ORAL_TABLET | Freq: Two times a day (BID) | ORAL | 0 refills | Status: DC

## 2020-12-06 MED ORDER — BENZTROPINE MESYLATE 1 MG PO TABS: 1.0000 mg | ORAL_TABLET | Freq: Every day | ORAL | 0 refills | Status: DC

## 2020-12-06 NOTE — Progress Notes (Signed)
Virtual Visit via Telephone Note  I connected with [name] on 12/06/20 at 10:20 AM EDT by telephone and verified that I am speaking with the correct person using two identifiers.  Location: Patient: Home Provider: Home Office   I discussed the limitations, risks, security and privacy concerns of performing an evaluation and management service by telephone and the availability of in person appointments. I also discussed with the patient that there may be a patient responsible charge related to this service. The patient expressed understanding and agreed to proceed.   History of Present Illness: Patient is evaluated by phone session.  He is taking his medication as prescribed.  Sometimes he feels down and sad about denies any suicidal thoughts.  He admitted being sad because his favorite hockey team [employer] lost and out of the [place].  He enjoys watching ice hockey and lately he is not walking as much.  We have recommended to have his blood work and Depakote level but patient forgot to make appointment with his PCP.  His last hemoglobin A1c was 6.8.  He admitted when he visit his mother he usually drink soda and eat cookies.  He has not checked his weight in a while because he does not have a rating scale.  He is not sure his weight but maybe he had gained weight from the past.  He feels the current medicine is working as he denies any suicidal thoughts, feeling of hopelessness, worthlessness or any panic attack.  He sleeps good.  He understands his dietary restriction as he taking monoaminoxydase inhibitors.  He is not interested in therapy.  He is not taking Accutane which he asked few weeks ago.  Patient told he read the side effects and scared to take the medicine.  Patient has no tremors, shakes or any EPS.    Past Psychiatric History:Reviewed. H/Omultipleinpatientfrom 1979-1972forsevere depression, suicidal thoughts. Noh/osuicidal attempt.H/OOCD, schizoaffective disorder  and major depression. Tried numerous medication including TCAs. We tried Lamictal but he develop a rash.  Psychiatric Specialty Exam: Physical Exam  Review of Systems  There were no vitals taken for this visit.There is no height or weight on file to calculate BMI.  General Appearance: NA  Eye Contact:  NA  Speech:  Slow  Volume:  Decreased  Mood:  Dysphoric  Affect:  NA  Thought Process:  Descriptions of Associations: Intact  Orientation:  Full (Time, Place, and Person)  Thought Content:  WDL  Suicidal Thoughts:  No  Homicidal Thoughts:  No  Memory:  Immediate;   Fair Recent;   Fair Remote;   Fair  Judgement:  Fair  Insight:  Shallow  Psychomotor Activity:  NA  Concentration:  Concentration: Fair and Attention Span: Fair  Recall:  [employer] of Knowledge:  Good  Language:  Good  Akathisia:  No  Handed:  Right  AIMS (if indicated):     Assets:  Communication Skills Desire for Improvement Housing Resilience  ADL's:  Intact  Cognition:  WNL  Sleep:   ok      Assessment and Plan: Major depressive disorder, recurrent with atypical features.  Anxiety.   Reinforced to schedule appointment with his PCP as his last hemoglobin A1c was 6.8 and he is not sure if he had gained weight from the past.  Discussed healthy lifestyle and watch his calorie intake.  Patient told since his favorite ice hockey is not clean anymore he is not going to visit her mother as frequently where he usually sees a  match.  He promised to work on his diet.  He also promised to call the PCP to schedule his visit and blood work including Depakote level.  He does not want to change the medication since he feels things are going well.  Continue Trilafon 24 mg at bedtime, Nardil 25 mg twice a day, Depakote 500 mg twice a day, Cogentin 1 mg at bedtime.  Patient is not interested in therapy.  Recommended to call us back if is any question or any concern.  Follow-up in 3 months.      Follow Up  Instructions:    I discussed the assessment and treatment plan with the patient. The patient was provided an opportunity to ask questions and all were answered. The patient agreed with the plan and demonstrated an understanding of the instructions.   The patient was advised to call back or seek an in-person evaluation if the symptoms worsen or if the condition fails to improve as anticipated.  I provided 18 minutes of non-face-to-face time during this encounter.   Kathlee Nations, MD

## 2020-12-18 ENCOUNTER — Ambulatory Visit (INDEPENDENT_AMBULATORY_CARE_PROVIDER_SITE_OTHER): Payer: Medicare Other | Admitting: Family Medicine

## 2020-12-18 ENCOUNTER — Other Ambulatory Visit: Payer: Self-pay

## 2020-12-18 ENCOUNTER — Encounter: Payer: Self-pay | Admitting: Family Medicine

## 2020-12-18 VITALS — BP 130/84 | HR 85 | Ht 70.0 in | Wt 255.2 lb

## 2020-12-18 DIAGNOSIS — E1169 Type 2 diabetes mellitus with other specified complication: Secondary | ICD-10-CM

## 2020-12-18 DIAGNOSIS — E119 Type 2 diabetes mellitus without complications: Secondary | ICD-10-CM | POA: Diagnosis not present

## 2020-12-18 DIAGNOSIS — E039 Hypothyroidism, unspecified: Secondary | ICD-10-CM

## 2020-12-18 DIAGNOSIS — Z1211 Encounter for screening for malignant neoplasm of colon: Secondary | ICD-10-CM

## 2020-12-18 DIAGNOSIS — F251 Schizoaffective disorder, depressive type: Secondary | ICD-10-CM | POA: Diagnosis not present

## 2020-12-18 DIAGNOSIS — E785 Hyperlipidemia, unspecified: Secondary | ICD-10-CM

## 2020-12-18 LAB — POCT GLYCOSYLATED HEMOGLOBIN (HGB A1C): HbA1c, POC (controlled diabetic range): 6.7 % (ref 0.0–7.0)

## 2020-12-18 NOTE — Patient Instructions (Signed)
It was great to see you today.  Here is a quick review of the things we talked about:  Psychiatry labs: It is odd to me that your psychiatrist is relying on me to draw labs.  We will check a Depakote level today.  Hypothyroidism: We will follow-up on your labs for today  Diabetes care: Your A1c is well controlled today.  It looks like you may have some decreased sensation in your feet which could be related to diabetes.  We do not have to start any medications today but if you do notice pain or tingling, we can provide medication to help with this issue.  Colon cancer screening: I have placed another referral to GI for colonoscopy.  You should get a call in the next 1-2 weeks.  I have also provided contact information for you to reach out if you do not hear anything in the next 1 or 2 weeks.  If all of your labs are normal, I will send you a message over my chart or send you a letter.  If there is anything to discuss, I will give you a phone call.

## 2020-12-18 NOTE — Progress Notes (Signed)
    SUBJECTIVE:   CHIEF COMPLAINT / HPI:   Schizoaffective disorder [name] reports that he was last seen by his psychiatrist earlier this month and his psychiatrist asked for him to follow-up with his PCP for blood work.  Upon chart review, there is documentation that his psychiatrist is depending on the PCP to follow-up a Depakote level.  Hypothyroidism -Synthroid 100 mcg daily  Diabetes -No current medication  Colon cancer screening He is previously been referred to GI for colonoscopy.  Chart review shows that he has been contacted multiple times but never returned a call to schedule a visit for a colonoscopy.  PERTINENT  PMH / PSH: A. fib, diabetes, hypothyroidism, hypercholesterolemia, disorder  OBJECTIVE:   BP 130/84   Pulse 85   Ht 5\' 10"  (1.778 m)   Wt 255 lb 3.2 oz (115.8 kg)   SpO2 95%   BMI 36.62 kg/m    General: Alert and cooperative and appears to be in no acute distress Cardio: Normal S1 and S2, no S3 or S4. Rhythm is regular. No murmurs or rubs.   Pulm: Clear to auscultation bilaterally, no crackles, wheezing, or diminished breath sounds. Normal respiratory effort Abdomen: Bowel sounds normal. Abdomen soft and non-tender.  Extremities: No peripheral edema. Warm/ well perfused.  Strong radial pulses. Neuro: Cranial nerves grossly intact  Diabetic Foot Exam - Simple   Simple Foot Form Diabetic Foot exam was performed with the following findings: Yes 12/18/2020 10:35 AM  Visual Inspection No deformities, no ulcerations, no other skin breakdown bilaterally: Yes Sensation Testing See comments: Yes Pulse Check See comments: Yes Comments Difficult to palpate DP or PT pulses bilaterally.  Similarly, he was not able to feel microfilament testing on either foot consistently.      ASSESSMENT/PLAN:   Schizoaffective disorder, depressive type [facility]) It appears that his psychiatry appointments are virtual at this time and his psychiatrist is relying on the PCP  to draw appropriate blood work.  I will draw valproic acid today and leave it up to the interpretation of the psychiatrist. -Trial valproic acid level today -Follow-up CMP, CBC for routine labs for his antipsychotic medications  T2DM (type 2 diabetes mellitus) ([place]) His A1c remains under 7.  No need to start additional medication today.  Hypothyroidism -Follow-up TSH   Colon cancer screening -New referral to GI placed -Contact information for [facility] also provided to patient  Poor peripheral pulses and sensation Physical exam was notable for decreased pulses and decreased sensation in his feet bilaterally.  ABIs performed in clinic today are reassuring.  Low suspicion for significant vascular disease at this time.  he was told that if he did begin to experience significant discomfort with his feet like burning or tingling or poor wound healing, he should return to clinic.  My concern for significant vascular pathology at this time is relatively low.  I do not think that any further vascular work-up is necessary at this time.  Any new future injuries, ulcerations or concerns may warrant further vascular work-up.  Matilde Haymaker, MD [place]

## 2020-12-18 NOTE — Assessment & Plan Note (Addendum)
It appears that his psychiatry appointments are virtual at this time and his psychiatrist is relying on the PCP to draw appropriate blood work.  I will draw valproic acid today and leave it up to the interpretation of the psychiatrist. -Trial valproic acid level today -Follow-up CMP, CBC for routine labs for his antipsychotic medications

## 2020-12-18 NOTE — Assessment & Plan Note (Signed)
His A1c remains under 7.  No need to start additional medication today.

## 2020-12-18 NOTE — Assessment & Plan Note (Signed)
Follow up TSH

## 2020-12-19 LAB — MICROALBUMIN, URINE: Microalbumin, Urine: 31.6 ug/mL

## 2020-12-21 LAB — COMPREHENSIVE METABOLIC PANEL
ALT: 9 IU/L (ref 0–44)
AST: 21 IU/L (ref 0–40)
Albumin/Globulin Ratio: 1.7 (ref 1.2–2.2)
Albumin: 4.3 g/dL (ref 3.8–4.8)
Alkaline Phosphatase: 126 IU/L — ABNORMAL HIGH (ref 44–121)
BUN/Creatinine Ratio: 15 (ref 10–24)
BUN: 17 mg/dL (ref 8–27)
Bilirubin Total: 0.4 mg/dL (ref 0.0–1.2)
CO2: 22 mmol/L (ref 20–29)
Calcium: 9.2 mg/dL (ref 8.6–10.2)
Chloride: 99 mmol/L (ref 96–106)
Creatinine, Ser: 1.11 mg/dL (ref 0.76–1.27)
Globulin, Total: 2.5 g/dL (ref 1.5–4.5)
Glucose: 156 mg/dL — ABNORMAL HIGH (ref 65–99)
Potassium: 4.3 mmol/L (ref 3.5–5.2)
Sodium: 142 mmol/L (ref 134–144)
Total Protein: 6.8 g/dL (ref 6.0–8.5)
eGFR: 75 mL/min/{1.73_m2} (ref >59)

## 2020-12-21 LAB — SPECIMEN STATUS REPORT

## 2020-12-21 LAB — LIPID PANEL
Chol/HDL Ratio: 7.2 ratio — ABNORMAL HIGH (ref 0.0–5.0)
Cholesterol, Total: 194 mg/dL (ref 100–199)
HDL: 27 mg/dL — ABNORMAL LOW (ref >39)
LDL Chol Calc (NIH): 113 mg/dL — ABNORMAL HIGH (ref 0–99)
Triglycerides: 309 mg/dL — ABNORMAL HIGH (ref 0–149)
VLDL Cholesterol Cal: 54 mg/dL — ABNORMAL HIGH (ref 5–40)

## 2020-12-21 LAB — VALPROIC ACID LEVEL

## 2020-12-21 LAB — TSH: TSH: 1.33 u[IU]/mL (ref 0.450–4.500)

## 2020-12-30 ENCOUNTER — Other Ambulatory Visit: Payer: Self-pay | Admitting: Family Medicine

## 2020-12-30 DIAGNOSIS — R569 Unspecified convulsions: Secondary | ICD-10-CM

## 2020-12-30 MED ORDER — ATORVASTATIN CALCIUM 80 MG PO TABS
80.0000 mg | ORAL_TABLET | Freq: Every day | ORAL | 2 refills | Status: DC
Start: 2020-12-30 — End: 2021-01-01

## 2020-12-30 NOTE — Progress Notes (Signed)
I tried getting in touch with [name] through the phone number provided in his chart.  I was not able to leave a message because his voicemail was not set up.  I was able to reach his mother and asked that they call the office first to review medical information.  If [name] does call the office, please let him know that his lab work looks good but we do need to repeat a lab draw for his Depakote level.  I would also like to increase his statin medication (atorvastatin) to 80 mg a day.  Testing shows that his cholesterol is still elevated and he does have some risk of heart attack or stroke in the next 10 years.  As result of his high risk, it would be helpful to increase his atorvastatin to 80 mg a day.  I have already sent this new medication to the pharmacy.  I have also put in an order for a repeat lab draw of his Depakote level.

## 2020-12-31 ENCOUNTER — Other Ambulatory Visit: Payer: Self-pay

## 2020-12-31 ENCOUNTER — Other Ambulatory Visit: Payer: Medicare Other

## 2020-12-31 DIAGNOSIS — F251 Schizoaffective disorder, depressive type: Secondary | ICD-10-CM

## 2020-12-31 NOTE — Progress Notes (Signed)
Ordered these labs for [facility] instead of [facility].

## 2021-01-01 ENCOUNTER — Other Ambulatory Visit: Payer: Self-pay | Admitting: Family Medicine

## 2021-01-01 DIAGNOSIS — F251 Schizoaffective disorder, depressive type: Secondary | ICD-10-CM

## 2021-01-01 LAB — CBC
Hematocrit: 48.5 % (ref 37.5–51.0)
Hemoglobin: 16.1 g/dL (ref 13.0–17.7)
MCH: 30.5 pg (ref 26.6–33.0)
MCHC: 33.2 g/dL (ref 31.5–35.7)
MCV: 92 fL (ref 79–97)
Platelets: 194 10*3/uL (ref 150–450)
RBC: 5.28 x10E6/uL (ref 4.14–5.80)
RDW: 13.7 % (ref 11.6–15.4)
WBC: 8.5 10*3/uL (ref 3.4–10.8)

## 2021-01-01 LAB — COMPREHENSIVE METABOLIC PANEL
ALT: 11 IU/L (ref 0–44)
AST: 26 IU/L (ref 0–40)
Albumin/Globulin Ratio: 1.8 (ref 1.2–2.2)
Albumin: 4.3 g/dL (ref 3.8–4.8)
Alkaline Phosphatase: 122 IU/L — ABNORMAL HIGH (ref 44–121)
BUN/Creatinine Ratio: 17 (ref 10–24)
BUN: 15 mg/dL (ref 8–27)
Bilirubin Total: 0.4 mg/dL (ref 0.0–1.2)
CO2: 26 mmol/L (ref 20–29)
Calcium: 9.3 mg/dL (ref 8.6–10.2)
Chloride: 96 mmol/L (ref 96–106)
Creatinine, Ser: 0.9 mg/dL (ref 0.76–1.27)
Globulin, Total: 2.4 g/dL (ref 1.5–4.5)
Glucose: 122 mg/dL — ABNORMAL HIGH (ref 65–99)
Potassium: 4.9 mmol/L (ref 3.5–5.2)
Sodium: 138 mmol/L (ref 134–144)
Total Protein: 6.7 g/dL (ref 6.0–8.5)
eGFR: 97 mL/min/{1.73_m2} (ref >59)

## 2021-01-01 LAB — VALPROIC ACID LEVEL: Valproic Acid Lvl: 77 ug/mL (ref 50–100)

## 2021-01-01 MED ORDER — ATORVASTATIN CALCIUM 40 MG PO TABS: 40.0000 mg | ORAL_TABLET | Freq: Every day | ORAL | 1 refills | Status: DC

## 2021-01-01 NOTE — Progress Notes (Signed)
Called and informed him that his Depakote level was appropriate.  He was also informed that his cholesterol is elevated and he would benefit from increasing his statin medication.  He reports that he has not been taking any statin medication.  Somehow, this medicine has been made into his chart that he is not taking it.  Today, we will start atorvastatin 40 mg and plan to recheck his cholesterol in the next 6-8 weeks.  The 10-year ASCVD risk score Mikey Bussing DC Brooke Bonito., et al., 2013) is: 27.1%   Values used to calculate the score:     Age: 60 years     Sex: Male     Is Non-Hispanic African American: No     Diabetic: Yes     Tobacco smoker: No     Systolic Blood Pressure: 599 mmHg     Is BP treated: No     HDL Cholesterol: 27 mg/dL     Total Cholesterol: 194 mg/dL   Matilde Haymaker, MD

## 2021-01-10 ENCOUNTER — Other Ambulatory Visit: Payer: Self-pay

## 2021-01-10 DIAGNOSIS — M109 Gout, unspecified: Secondary | ICD-10-CM

## 2021-01-10 MED ORDER — ALLOPURINOL 300 MG PO TABS: 300.0000 mg | ORAL_TABLET | Freq: Every day | ORAL | 3 refills | Status: DC

## 2021-01-20 ENCOUNTER — Telehealth (HOSPITAL_COMMUNITY): Payer: Self-pay

## 2021-01-20 NOTE — Telephone Encounter (Deleted)
Patient called regarding his Lipitor. I returned patient's call to speak with him but he did not pick up so writer LVM. Writer stated in voicemail that Dr. Adele Schilder does not prescribe that medication to him and that it's prescribed by a Dr.

## 2021-01-20 NOTE — Telephone Encounter (Signed)
Patient called regarding his Lipitor. I returned patient's call to speak with him but he did not pick up so writer LVM. Writer stated in voicemail that Dr. Adele Schilder doesn't prescribe that medication to him and that it's prescribed by a Dr. Matilde Haymaker and to call that office regarding his Lipitor

## 2021-01-21 ENCOUNTER — Telehealth (HOSPITAL_COMMUNITY): Payer: Self-pay | Admitting: *Deleted

## 2021-01-21 NOTE — Telephone Encounter (Signed)
He has started Lipitor he says. Will remind him of lab work.

## 2021-01-21 NOTE — Telephone Encounter (Signed)
Please call him and to verify if there is any change in the medication or add a new medication.  He also need to see a therapist.  We are still waiting for his Depakote level.  I can see him early but make sure we have blood work and all other information verified.

## 2021-01-21 NOTE — Telephone Encounter (Signed)
Pt called with c/o increased depression, crying spells, decreased sleep and appetite. Pt would like an earlier in office visit; currently scheduled for 03/11/21. Please review.

## 2021-01-22 ENCOUNTER — Telehealth (HOSPITAL_COMMUNITY): Payer: Self-pay | Admitting: *Deleted

## 2021-01-22 NOTE — Telephone Encounter (Signed)
Writer has left 2 VM's regarding pt seeing provider earlier than 03/11/21 due to recent increase in depression and anxiety. Pt advised that front desk has also been calling to confirm earlier appointment. fYI.

## 2021-01-23 ENCOUNTER — Telehealth (HOSPITAL_COMMUNITY): Payer: Self-pay | Admitting: *Deleted

## 2021-01-23 NOTE — Telephone Encounter (Signed)
This nurse spoke with pt regarding discontinuing Lipitor due to increase in depression, OCD symptoms, decreased sleep. Pt says that he d/c Lipitor a couple of days ago, and it seems to have helped a bit, but that he is still having symptoms, especially the increase in OCD. Pt has an upcoming appointment on 03/11/21 but states that he "can't wait that long" and wants to be seen earlier. Please review and advise. Thanks.

## 2021-01-23 NOTE — Telephone Encounter (Signed)
He need to give more time since he does stop the Lipitor.  If symptoms do not improve in 1 week we will schedule his appointment earlier.

## 2021-01-23 NOTE — Telephone Encounter (Signed)
This nurse spoke with pt regarding discontinuing Lipitor for 1 week and then reassess. Pt states he has been off medication for 3 or 4 days. Nurse encouraged pt to give a few more days and call nurse back on Monday 01/27/21. Pt agreed. Pt has been advised to go to [facility] or ED if he feels he cannot tolerate symptoms in the meantime. Pt verbalizes understanding.

## 2021-02-06 ENCOUNTER — Telehealth (INDEPENDENT_AMBULATORY_CARE_PROVIDER_SITE_OTHER): Payer: Medicare Other | Admitting: Family Medicine

## 2021-02-06 DIAGNOSIS — N529 Male erectile dysfunction, unspecified: Secondary | ICD-10-CM | POA: Diagnosis not present

## 2021-02-06 DIAGNOSIS — L700 Acne vulgaris: Secondary | ICD-10-CM

## 2021-02-06 MED ORDER — SILDENAFIL CITRATE 25 MG PO TABS: 25.0000 mg | ORAL_TABLET | Freq: Every day | ORAL | 0 refills | Status: DC | PRN

## 2021-02-06 NOTE — Progress Notes (Signed)
[facility] Tele[facility]icine Visit  Patient consented to have virtual visit and was identified by name and date of birth. Method of visit: Telephone  Encounter participants: Patient: [name] - located at Home Provider: Rise Patience - located at [place]  Chief Complaint: Erectile dysfunction  HPI:  Monday started getting a sore throat and thinks it is due to the dry weather and his apartment. He has had cold symptoms and some congestion.  Because of this he was asked to switch his visit to a virtual visit, which she was okay with.  Did offer for male provider but he had no preference.  Notes that for the last 2 years he has had erectile dysfunction that is not the point where he cannot even masturbate anymore.  He does note that he has an issue with OCD and other psychiatric disorders, which he is following with psychiatric providers to control.  He does not have any cardiac history except for an irregular beat that he is aware of.  He does note that he was unable to continue taking his atorvastatin due to it "making my OCD worse" and that it was recommended for him to discontinue this if it was affecting his mental health.  Does note that he has never had sexual intercourse, but is pressuring himself to do so soon as he is concerned about this with his age.  Patient has not been able to keep an erection for longer than a minute, even when in a sexually arousing situation.   Of note does note that he has had acne for a few months and has been using Cetaphil cleanser.  He does appear worried about this and would like to discuss this further at our visit  ROS: per HPI  Pertinent PMHx: Hyperlipidemia, Type 2 DM  Exam:  There were no vitals taken for this visit.  Respiratory: Breathing comfortably, speaking in full sentences unlabored  Assessment/Plan:  Erectile dysfunction Unclear etiology, noted previously in 2016.  Does not this has been occurring for the  last 2 years.  ASCVD risk is 27.1%, patient is not tolerant of taking a statin medication.  Discussed at length the risks of taking Viagra or other medications due to his increased cardiac risk.  Patient reported understanding and is wanting to start with a lower dose trial. - Viagra 25 mg PRN - Recommend continuation/restarting atorvastatin if able to tolerate - Patient given ED and return precautions    Time spent during visit with patient: 32 minutes

## 2021-02-06 NOTE — Assessment & Plan Note (Signed)
Unclear etiology, noted previously in 2016.  Does not this has been occurring for the last 2 years.  ASCVD risk is 27.1%, patient is not tolerant of taking a statin medication.  Discussed at length the risks of taking Viagra or other medications due to his increased cardiac risk.  Patient reported understanding and is wanting to start with a lower dose trial. - Viagra 25 mg PRN - Recommend continuation/restarting atorvastatin if able to tolerate - Patient given ED and return precautions

## 2021-02-18 ENCOUNTER — Other Ambulatory Visit (HOSPITAL_COMMUNITY): Payer: Self-pay | Admitting: Psychiatry

## 2021-02-18 DIAGNOSIS — F339 Major depressive disorder, recurrent, unspecified: Secondary | ICD-10-CM

## 2021-03-11 ENCOUNTER — Other Ambulatory Visit: Payer: Self-pay

## 2021-03-11 ENCOUNTER — Encounter (HOSPITAL_COMMUNITY): Payer: Self-pay | Admitting: Psychiatry

## 2021-03-11 ENCOUNTER — Telehealth (INDEPENDENT_AMBULATORY_CARE_PROVIDER_SITE_OTHER): Payer: Medicare Other | Admitting: Psychiatry

## 2021-03-11 DIAGNOSIS — F339 Major depressive disorder, recurrent, unspecified: Secondary | ICD-10-CM

## 2021-03-11 DIAGNOSIS — F419 Anxiety disorder, unspecified: Secondary | ICD-10-CM | POA: Diagnosis not present

## 2021-03-11 MED ORDER — PERPHENAZINE 8 MG PO TABS: 24.0000 mg | ORAL_TABLET | Freq: Every day | ORAL | 0 refills | Status: DC

## 2021-03-11 MED ORDER — DEPAKOTE ER 250 MG PO TB24: 500.0000 mg | ORAL_TABLET | Freq: Two times a day (BID) | ORAL | 0 refills | Status: DC

## 2021-03-11 MED ORDER — BENZTROPINE MESYLATE 1 MG PO TABS: 1.0000 mg | ORAL_TABLET | Freq: Every day | ORAL | 0 refills | Status: DC

## 2021-03-11 MED ORDER — PHENELZINE SULFATE 15 MG PO TABS: 45.0000 mg | ORAL_TABLET | Freq: Two times a day (BID) | ORAL | 0 refills | Status: DC

## 2021-03-11 NOTE — Progress Notes (Signed)
Virtual Visit via Telephone Note  I connected with [name] on 03/11/21 at 10:00 AM EDT by telephone and verified that I am speaking with the correct person using two identifiers.  Location: Patient: home Provider: home office   I discussed the limitations, risks, security and privacy concerns of performing an evaluation and management service by telephone and the availability of in person appointments. I also discussed with the patient that there may be a patient responsible charge related to this service. The patient expressed understanding and agreed to proceed.   History of Present Illness: Patient is evaluated by phone session.  He is taking his medication as prescribed.  He is no longer taking Lipitor because he felt his depression and anxiety get worse when he was taking Lipitor.  He had a visit with his PCP and he had concern about erectile dysfunction and acne.  He was also discussed about cholesterol medicine but no new medicine was given to try since Lipitor did not work.  He is concerned about his general health and sometimes he obsess about his physical health but denies any paranoia, hallucination, suicidal thoughts.  He admitted noncompliant with his medication for 3 days when he visited his mother 2 weeks ago.  He immediately realized because his symptoms started to get worse and we need to make trip short back home.  He has mild tremors but does not interfere with his daily activities and he takes Cogentin.  He had appointment with his physician to address his acne with dermatologist.  He had blood work and his Depakote level is therapeutic.  He denies any panic attack.  He is not interested in therapy and does not want to change the medication since he feels his symptoms are stable with current medication and dosage.  Past Psychiatric History: Reviewed. H/O multiple inpatient from [phone] for severe depression, suicidal thoughts. No h/o suicidal attempt. H/O OCD,  schizoaffective disorder and major depression.  Tried numerous medication including TCAs.  We tried Lamictal but he develop a rash.  Recent Results (from the past 2160 hour(s))  HgB A1c     Status: None   Collection Time: 12/18/20 10:04 AM  Result Value Ref Range   Hemoglobin A1C     HbA1c POC (<> result, manual entry)     HbA1c, POC (prediabetic range)     HbA1c, POC (controlled diabetic range) 6.7 0.0 - 7.0 %  TSH     Status: None   Collection Time: 12/18/20 10:55 AM  Result Value Ref Range   TSH 1.330 0.450 - 4.500 uIU/mL  Valproic acid level     Status: None   Collection Time: 12/18/20 10:55 AM  Result Value Ref Range   Valproic Acid Lvl CANCELED ug/mL    Comment: Test not performed. Insufficient specimen to perform or complete analysis.                                 Detection Limit = 4                            <4 indicates None Detected Toxicity may occur at levels of 100-500. Measurements of free unbound valproic acid may improve the assess- ment of clinical response.  Result canceled by the ancillary.   Lipid Panel     Status: Abnormal   Collection Time: 12/18/20 10:55 AM  Result Value Ref  Range   Cholesterol, Total 194 100 - 199 mg/dL   Triglycerides 309 (H) 0 - 149 mg/dL   HDL 27 (L) >39 mg/dL   VLDL Cholesterol Cal 54 (H) 5 - 40 mg/dL   LDL Chol Calc (NIH) 113 (H) 0 - 99 mg/dL   Chol/HDL Ratio 7.2 (H) 0.0 - 5.0 ratio    Comment:                                   T. Chol/HDL Ratio                                             Men  Women                               1/2 Avg.Risk  3.4    3.3                                   Avg.Risk  5.0    4.4                                2X Avg.Risk  9.6    7.1                                3X Avg.Risk 23.4   11.0   Comprehensive metabolic panel     Status: Abnormal   Collection Time: 12/18/20 10:55 AM  Result Value Ref Range   Glucose 156 (H) 65 - 99 mg/dL   BUN 17 8 - 27 mg/dL   Creatinine, Ser 1.11 0.76 - 1.27 mg/dL    eGFR 75 >59 mL/min/1.73   BUN/Creatinine Ratio 15 10 - 24   Sodium 142 134 - 144 mmol/L   Potassium 4.3 3.5 - 5.2 mmol/L   Chloride 99 96 - 106 mmol/L   CO2 22 20 - 29 mmol/L   Calcium 9.2 8.6 - 10.2 mg/dL   Total Protein 6.8 6.0 - 8.5 g/dL   Albumin 4.3 3.8 - 4.8 g/dL   Globulin, Total 2.5 1.5 - 4.5 g/dL   Albumin/Globulin Ratio 1.7 1.2 - 2.2   Bilirubin Total 0.4 0.0 - 1.2 mg/dL   Alkaline Phosphatase 126 (H) 44 - 121 IU/L   AST 21 0 - 40 IU/L   ALT 9 0 - 44 IU/L  Specimen status report     Status: None   Collection Time: 12/18/20 10:55 AM  Result Value Ref Range   specimen status report Comment     Comment: Written Authorization Written Authorization Written Authorization Received. Authorization received from [facility] 12-20-2020 Logged by Marcene Duos   Microalbumin, urine     Status: None   Collection Time: 12/18/20 11:38 AM  Result Value Ref Range   Microalbumin, Urine 31.6 Not Estab. ug/mL  Valproic acid level     Status: None   Collection Time: 12/31/20  1:53 PM  Result Value Ref Range   Valproic Acid Lvl 77 50 - 100 ug/mL    Comment:  Detection Limit = 4                            <4 indicates None Detected Toxicity may occur at levels of 100-500. Measurements of free unbound valproic acid may improve the assess- ment of clinical response.   Comprehensive metabolic panel     Status: Abnormal   Collection Time: 12/31/20  1:53 PM  Result Value Ref Range   Glucose 122 (H) 65 - 99 mg/dL   BUN 15 8 - 27 mg/dL   Creatinine, Ser 0.90 0.76 - 1.27 mg/dL   eGFR 97 >59 mL/min/1.73   BUN/Creatinine Ratio 17 10 - 24   Sodium 138 134 - 144 mmol/L   Potassium 4.9 3.5 - 5.2 mmol/L   Chloride 96 96 - 106 mmol/L   CO2 26 20 - 29 mmol/L   Calcium 9.3 8.6 - 10.2 mg/dL   Total Protein 6.7 6.0 - 8.5 g/dL   Albumin 4.3 3.8 - 4.8 g/dL   Globulin, Total 2.4 1.5 - 4.5 g/dL   Albumin/Globulin Ratio 1.8 1.2 - 2.2   Bilirubin Total  0.4 0.0 - 1.2 mg/dL   Alkaline Phosphatase 122 (H) 44 - 121 IU/L   AST 26 0 - 40 IU/L   ALT 11 0 - 44 IU/L  CBC     Status: None   Collection Time: 12/31/20  1:53 PM  Result Value Ref Range   WBC 8.5 3.4 - 10.8 x10E3/uL   RBC 5.28 4.14 - 5.80 x10E6/uL   Hemoglobin 16.1 13.0 - 17.7 g/dL   Hematocrit 48.5 37.5 - 51.0 %   MCV 92 79 - 97 fL   MCH 30.5 26.6 - 33.0 pg   MCHC 33.2 31.5 - 35.7 g/dL   RDW 13.7 11.6 - 15.4 %   Platelets 194 150 - 450 x10E3/uL      Psychiatric Specialty Exam: Physical Exam  Review of Systems  Weight 252 lb (114.3 kg).Body mass index is 36.16 kg/m.  General Appearance: NA  Eye Contact:  NA  Speech:  Slow  Volume:  Decreased  Mood:  Euthymic  Affect:  NA  Thought Process:  Descriptions of Associations: Intact  Orientation:  Full (Time, Place, and Person)  Thought Content:  Rumination  Suicidal Thoughts:  No  Homicidal Thoughts:  No  Memory:  Immediate;   Fair Recent;   Fair Remote;   Fair  Judgement:  Fair  Insight:  Shallow  Psychomotor Activity:  NA  Concentration:  Concentration: Fair and Attention Span: Fair  Recall:  [employer] of Knowledge:  Fair  Language:  Fair  Akathisia:  No  Handed:  Right  AIMS (if indicated):     Assets:  Communication Skills Desire for Improvement Housing Transportation  ADL's:  Intact  Cognition:  WNL  Sleep:   ok      Assessment and Plan: Major depressive disorder, recurrent with atypical features.  Anxiety.  I reviewed blood work results and his therapeutic level of Depakote.  I also reviewed notes from his PCP.  He is no longer taking Lipitor because he tried and that cause worsening of his symptoms.  I encouraged he should discuss with his PCP to address his high cholesterol since he is not taking Lipitor and may consider an alternative.  I encourage if he visit his mother which he usually once a month that he should take his medication.  Does not want to change the medication.  Continue Trilafon 24  mg at bedtime, Nardil 25 mg twice a day, Depakote 500 mg twice a day and Cogentin 1 mg at bedtime.  Patient is not interested in therapy.  Recommended to call us back if is any question or any concern.  Follow-up in 3 months.  I will forward my note to her PCP.  Follow Up Instructions:    I discussed the assessment and treatment plan with the patient. The patient was provided an opportunity to ask questions and all were answered. The patient agreed with the plan and demonstrated an understanding of the instructions.   The patient was advised to call back or seek an in-person evaluation if the symptoms worsen or if the condition fails to improve as anticipated.  I provided 25 minutes of non-face-to-face time during this encounter.   Kathlee Nations, MD

## 2021-03-28 ENCOUNTER — Other Ambulatory Visit: Payer: Self-pay | Admitting: Family Medicine

## 2021-03-31 ENCOUNTER — Other Ambulatory Visit (HOSPITAL_COMMUNITY): Payer: Self-pay | Admitting: *Deleted

## 2021-03-31 DIAGNOSIS — F419 Anxiety disorder, unspecified: Secondary | ICD-10-CM

## 2021-03-31 DIAGNOSIS — F339 Major depressive disorder, recurrent, unspecified: Secondary | ICD-10-CM

## 2021-04-01 ENCOUNTER — Telehealth (HOSPITAL_COMMUNITY): Payer: Self-pay

## 2021-04-01 NOTE — Telephone Encounter (Addendum)
[employer] PRES[facility] COVERAGE APPROVED  DEPAKOTE ER 250MG  TABLET EFFECTIVE 07/06/2020 TO 03/31/2022  NOTIFIED PHARMACY/PT SET UP W/TEXT MESSAGING SO WILL BE NOTIFIED WHEN MEDICATION IS READY FOR PICK UP

## 2021-04-10 ENCOUNTER — Telehealth (HOSPITAL_COMMUNITY): Payer: Self-pay | Admitting: *Deleted

## 2021-04-10 NOTE — Telephone Encounter (Signed)
I called pt but had to leave VM, with pt permission. Pt advised to d/c Cogentin for a few days and if tremors worsenen restart medication. Pt advised to call this nurse to update [place].

## 2021-04-10 NOTE — Telephone Encounter (Signed)
Writer spoke with pt to advise taking Lipitor for two weeks and if depression is worsened or having other s/e to contact PCP (prescribed med) to try a different medication. Pt then asked if the Cogentin could be making tremors worse. He has Googled this and is now worried.

## 2021-04-10 NOTE — Telephone Encounter (Signed)
He can try for 2 weeks and if feel worsening or having any side effects than he should discussed with physcian to see if they can replace with other medication.

## 2021-04-10 NOTE — Telephone Encounter (Signed)
Spoke with pt who states that he was prescribed Lipitor and says that he hasn't been taking the medication due to reading that it can cause/increase depression. Pt has talked to [place] before about this last summer. [name] describes his depression as a 4/10 with no s.I. He would like your advice. Thanks.

## 2021-04-10 NOTE — Telephone Encounter (Signed)
Cogentin is to help the tremors.  If he is worried that he can stop for few days to see if the tremors improve.  However if it get worse then he need to go back on Cogentin.

## 2021-04-16 ENCOUNTER — Telehealth (HOSPITAL_COMMUNITY): Payer: Self-pay

## 2021-04-16 NOTE — Telephone Encounter (Signed)
RELAYED MESSAGE - PATIENT DID NOT PICK UP SO WRITER LVM

## 2021-04-16 NOTE — Telephone Encounter (Signed)
Writer spoke with [name] today and he stated that he is going to restart his Cogentin and that he's doing better today. He sounded pretty good as well. He wanted to know if you want him to go for labs. I didn't see any ordered in Labs by [place] or anything in the last office visit note regarding labs? Also, he was asking if his Depakote could make him shaky?  Dr. Marguerite Olea response: no labs needed for now. Yes, Depakote can cause shakes but his level I ok.

## 2021-05-02 ENCOUNTER — Telehealth (HOSPITAL_COMMUNITY): Payer: Self-pay | Admitting: *Deleted

## 2021-05-02 NOTE — Telephone Encounter (Signed)
Writer spoke with pt to advise that restarting Cogentin 1 mg qd may be the reason for the mild confusion that pt describes. Med ed reinforced r/t Tricyclic antidepressants. Pt verbalizes understanding ans states he has been on those meds for many years. Pt encouraged to use BP cuff that he has at home and to go to closest ED if confusion gets worse. Pt stated that "it's not that bad. But I will if I have to". Pt to f/u with this nurse next week. Pt has requested an in person visit for his next appointment to visualize tremors. Message to front desk regarding next appointment.

## 2021-05-02 NOTE — Telephone Encounter (Signed)
Pt called c/o feeling "more confused" and "not thinking clearly" with an increase in OCD symptoms as well. Pt states that he has not changed any meds and is taking all meds as prescribed. Last head Ct w/o contrast was done on 03/24/20 r/t a fall (wnl). Please review and advise.

## 2021-05-02 NOTE — Telephone Encounter (Signed)
Which he started taking Cogentin recently, that may cause confusion if he has not taken in a while and started recently.  If confusion persists then he need to go to the ER for evaluation.  He is on tricyclic antidepressant and required dietary restrictions to avoid hypertensive crisis.

## 2021-05-05 NOTE — Telephone Encounter (Signed)
Thanks

## 2021-05-08 ENCOUNTER — Other Ambulatory Visit: Payer: Self-pay

## 2021-05-08 ENCOUNTER — Encounter (HOSPITAL_COMMUNITY): Payer: Self-pay | Admitting: Psychiatry

## 2021-05-08 ENCOUNTER — Telehealth (HOSPITAL_BASED_OUTPATIENT_CLINIC_OR_DEPARTMENT_OTHER): Payer: Medicare HMO | Admitting: Psychiatry

## 2021-05-08 VITALS — BP 140/90 | HR 64 | Resp 14 | Wt 260.0 lb

## 2021-05-08 DIAGNOSIS — R413 Other amnesia: Secondary | ICD-10-CM

## 2021-05-08 DIAGNOSIS — F339 Major depressive disorder, recurrent, unspecified: Secondary | ICD-10-CM

## 2021-05-08 DIAGNOSIS — F419 Anxiety disorder, unspecified: Secondary | ICD-10-CM | POA: Diagnosis not present

## 2021-05-08 DIAGNOSIS — R251 Tremor, unspecified: Secondary | ICD-10-CM

## 2021-05-08 DIAGNOSIS — R69 Illness, unspecified: Secondary | ICD-10-CM | POA: Diagnosis not present

## 2021-05-08 MED ORDER — PERPHENAZINE 8 MG PO TABS: 24.0000 mg | ORAL_TABLET | Freq: Every day | ORAL | 0 refills | Status: DC

## 2021-05-08 MED ORDER — PHENELZINE SULFATE 15 MG PO TABS: 45.0000 mg | ORAL_TABLET | Freq: Two times a day (BID) | ORAL | 0 refills | Status: DC

## 2021-05-08 MED ORDER — DEPAKOTE ER 250 MG PO TB24
500.0000 mg | ORAL_TABLET | Freq: Two times a day (BID) | ORAL | 0 refills | Status: DC
Start: 2021-05-08 — End: 2021-08-07

## 2021-05-08 MED ORDER — VALBENAZINE TOSYLATE 40 MG PO CAPS: 40.0000 mg | ORAL_CAPSULE | Freq: Every day | ORAL | 0 refills | Status: DC

## 2021-05-08 NOTE — Progress Notes (Signed)
Location: Patient: Office Provider: Office    History of Present Illness: Patient came in person for his appointment.  He requested an earlier appointment because he noticed sometimes forgetful, confused and his tremor has been getting worse.  He noticed the symptoms more than 6 months ago.  He denies any anxiety, depression, paranoia or any suicidal thoughts.  Initially he thought it is coming from Lipitor but he stopped and still symptoms persist.  We have prescribed Cogentin to help the tremors but he did not notice anything and noticed worsening of his memory.  He recalled there are times when he go to the kitchen but does not remember why he is in the kitchen.  He worried about his memory and wondering if he has Alzheimer's.  He recalls his aunt has Alzheimer's.  He has tremors in his both hands.  He is on Depakote, Trilafon and normal.  His last Depakote level was therapeutic.  He is very reluctant to change his medication because he believed that kept him stable.  He had a visit to the neurologist 10 years ago and at that time he did not have worsening of symptoms.  He understand that some of the psych medication can cause tremors but lately it is getting worse.  He is driving but tried to avoid long distance because sometimes he forgetful.  He denies any crying spells.  His appetite is okay.  His weight is stable.  His last hemoglobin A1c was 6.8 which was done few months ago.  Past Psychiatric History: Reviewed. H/O multiple inpatient from [phone] for severe depression, suicidal thoughts. No h/o suicidal attempt. H/O OCD, schizoaffective disorder and major depression.  Tried numerous medication including TCAs.  We tried Lamictal but he develop a rash.  No results found for this or any previous visit (from the past 2160 hour(s)).    Psychiatric Specialty Exam: Physical Exam  Review of Systems  Blood pressure 140/90, pulse 64, resp. rate 14, weight 260 lb (117.9 kg).There is no height or  weight on file to calculate BMI.  General Appearance: Fairly Groomed  Eye Contact:  Fair  Speech:  Slow  Volume:  Decreased  Mood:  Euthymic  Affect:  Congruent  Thought Process:  Descriptions of Associations: Intact  Orientation:  Full (Time, Place, and Person)  Thought Content:  WDL  Suicidal Thoughts:  No  Homicidal Thoughts:  No  Memory:  Immediate;   Fair Recent;   Fair Remote;   Fair  Judgement:  Fair  Insight:  Shallow  Psychomotor Activity:  Decreased, Psychomotor Retardation, and Tremor  Concentration:  Concentration: Fair and Attention Span: Fair  Recall:  [employer] of Knowledge:  Fair  Language:  Fair  Akathisia:   tremors  Handed:  Right  AIMS (if indicated):     Assets:  Communication Skills Desire for Improvement Housing Social Support Transportation  ADL's:  Intact  Cognition:  Impaired,  Mild  Sleep:   10 hrs      Assessment and Plan: Tremors.  Memory changes.  Major depressive disorder, recurrent with atypical features.  Anxiety.  I had a long discussion with the patient about his symptoms which is mostly related to tremors, forgetfulness.  We have tried Cogentin but that did not help him as much and he noticed further confusion.  He is not taking Cogentin but is still have residual symptoms of memory issues.  We talked about trying Ingrezza 40 mg to help the tremors however if tremors do not get  better and I do believe he should see neurologist to address his tremors and memory changes.  Like to have a comprehensive evaluation for his memory problems.  At this time he does not want to change the medication even though we offered to cut down the Trilafon or Depakote.  Patient told he has been on these medication for more than 40 years and they have been working okay.  I will forward my message to his PCP and if increasing it did not help his tremors then he need a neurology opinion.  For now continue Trilafon 24 mg at bedtime, Nardil 25 mg twice a day and  Depakote 500 mg twice a day.  We will provide samples of Ingrezza 40 mg and also called in the prescription.  I recommended to call us back if there is any question or any concern.  Otherwise follow up in 3 months.    Reviewed blood work results and his therapeutic level of Depakote.  I also reviewed notes from his PCP.  He is no longer taking Lipitor because he tried and that cause worsening of his symptoms.  I encouraged he should discuss with his PCP to address his high cholesterol since he is not taking Lipitor and may consider an alternative.  I encourage if he visit his mother which he usually once a month that he should take his medication.  Does not want to change the medication.  Continue Trilafon 24 mg at bedtime, Nardil 25 mg twice a day, Depakote 500 mg twice a day and Cogentin 1 mg at bedtime.  Patient is not interested in therapy.  Recommended to call us back if is any question or any concern.  Follow-up in 3 months.  I will forward my note to her PCP.  Follow Up Instructions:    I discussed the assessment and treatment plan with the patient. The patient was provided an opportunity to ask questions and all were answered. The patient agreed with the plan and demonstrated an understanding of the instructions.   The patient was advised to call back or seek an in-person evaluation if the symptoms worsen or if the condition fails to improve as anticipated.  I provided 25 minutes of non-face-to-face time during this encounter.   Kathlee Nations, MD

## 2021-05-14 ENCOUNTER — Other Ambulatory Visit (HOSPITAL_COMMUNITY): Payer: Self-pay | Admitting: Psychiatry

## 2021-05-14 ENCOUNTER — Telehealth (HOSPITAL_COMMUNITY): Payer: Self-pay | Admitting: *Deleted

## 2021-05-14 DIAGNOSIS — F339 Major depressive disorder, recurrent, unspecified: Secondary | ICD-10-CM

## 2021-05-14 NOTE — Telephone Encounter (Signed)
Received VM from [facility] regarding a black box warning or contraindication, involving Ingrezza and Nardil. Possible interaction can cause Serotonin Syndrome, and toxicity. A fax was also received and placed in your box. Pt does not want to come of the Nardil.

## 2021-05-17 ENCOUNTER — Other Ambulatory Visit (HOSPITAL_COMMUNITY): Payer: Self-pay | Admitting: Psychiatry

## 2021-05-17 DIAGNOSIS — F339 Major depressive disorder, recurrent, unspecified: Secondary | ICD-10-CM

## 2021-06-03 ENCOUNTER — Ambulatory Visit: Payer: Medicaid Other | Admitting: Family Medicine

## 2021-06-09 ENCOUNTER — Telehealth (HOSPITAL_COMMUNITY): Payer: Medicare Other | Admitting: Psychiatry

## 2021-06-10 DIAGNOSIS — Z6837 Body mass index (BMI) 37.0-37.9, adult: Secondary | ICD-10-CM | POA: Diagnosis not present

## 2021-06-10 DIAGNOSIS — G250 Essential tremor: Secondary | ICD-10-CM | POA: Diagnosis not present

## 2021-06-10 DIAGNOSIS — I2510 Atherosclerotic heart disease of native coronary artery without angina pectoris: Secondary | ICD-10-CM | POA: Diagnosis not present

## 2021-06-10 DIAGNOSIS — Z8249 Family history of ischemic heart disease and other diseases of the circulatory system: Secondary | ICD-10-CM | POA: Diagnosis not present

## 2021-06-10 DIAGNOSIS — Z9181 History of falling: Secondary | ICD-10-CM | POA: Diagnosis not present

## 2021-06-10 DIAGNOSIS — Z7901 Long term (current) use of anticoagulants: Secondary | ICD-10-CM | POA: Diagnosis not present

## 2021-06-10 DIAGNOSIS — E039 Hypothyroidism, unspecified: Secondary | ICD-10-CM | POA: Diagnosis not present

## 2021-06-10 DIAGNOSIS — R69 Illness, unspecified: Secondary | ICD-10-CM | POA: Diagnosis not present

## 2021-06-10 DIAGNOSIS — E669 Obesity, unspecified: Secondary | ICD-10-CM | POA: Diagnosis not present

## 2021-06-10 DIAGNOSIS — M109 Gout, unspecified: Secondary | ICD-10-CM | POA: Diagnosis not present

## 2021-06-10 DIAGNOSIS — G40909 Epilepsy, unspecified, not intractable, without status epilepticus: Secondary | ICD-10-CM | POA: Diagnosis not present

## 2021-06-10 DIAGNOSIS — N529 Male erectile dysfunction, unspecified: Secondary | ICD-10-CM | POA: Diagnosis not present

## 2021-06-16 ENCOUNTER — Ambulatory Visit: Payer: Medicaid Other | Admitting: Family Medicine

## 2021-06-24 ENCOUNTER — Other Ambulatory Visit: Payer: Self-pay

## 2021-06-24 ENCOUNTER — Encounter: Payer: Self-pay | Admitting: Family Medicine

## 2021-06-24 ENCOUNTER — Ambulatory Visit (INDEPENDENT_AMBULATORY_CARE_PROVIDER_SITE_OTHER): Payer: Medicare HMO | Admitting: Family Medicine

## 2021-06-24 VITALS — BP 129/88 | HR 78 | Ht 70.0 in | Wt 262.4 lb

## 2021-06-24 DIAGNOSIS — Z789 Other specified health status: Secondary | ICD-10-CM

## 2021-06-24 DIAGNOSIS — N529 Male erectile dysfunction, unspecified: Secondary | ICD-10-CM

## 2021-06-24 DIAGNOSIS — M25559 Pain in unspecified hip: Secondary | ICD-10-CM

## 2021-06-24 DIAGNOSIS — E119 Type 2 diabetes mellitus without complications: Secondary | ICD-10-CM | POA: Diagnosis not present

## 2021-06-24 DIAGNOSIS — R2689 Other abnormalities of gait and mobility: Secondary | ICD-10-CM | POA: Diagnosis not present

## 2021-06-24 MED ORDER — DICLOFENAC SODIUM 1 % EX GEL: 4.0000 g | Freq: Four times a day (QID) | CUTANEOUS | 0 refills | Status: DC

## 2021-06-24 NOTE — Assessment & Plan Note (Signed)
Significant ASCVD risk.  Patient not able to tolerate statin medication.  Trialed Viagra, which was not successful in the long run.  Do not plan to prescribe further at this time.

## 2021-06-24 NOTE — Addendum Note (Signed)
Addended by: Francene Castle on: 06/24/2021 02:46 PM   Modules accepted: Orders

## 2021-06-24 NOTE — Patient Instructions (Signed)
For your hip pain, I think that it is a more chronic issue that may be contributing to your issues with balance.  I put in a referral to physical therapy to help with that we will reach out to you soon.  I have also resent in your prescription for diclofenac gel to try and help with the pain as needed.  You can use Tylenol or ibuprofen as needed for the pain as well.  In regards to the pain in your foot when you are driving, I think it is related to the position of your foot.  I recommend making sure that you do not have your wallet in your pockets while you are driving, using cruise control while driving on the highway as long as it is safe, and taking frequent breaks as needed.  We will get an A1c on you today.  We will follow-up in the next couple of months to discuss other issues that you have concerns about.

## 2021-06-24 NOTE — Assessment & Plan Note (Signed)
Last A1c 6 months ago was 6.7.  Patient is currently not on any medications and is overall diet controlled.  Patient is prescribed atorvastatin but notes statin intolerance.  Continued education today regarding restarting statin was provided. - Follow-up A1c

## 2021-06-24 NOTE — Progress Notes (Signed)
SUBJECTIVE:   CHIEF COMPLAINT / HPI:   Hip Pain   balance issues Patient reports that he fell a couple of years ago while he was walking up the stairs to his mother's house with groceries and his foot got caught and he tripped.  Patient has been evaluated with hip x-rays in the last year, has not had any new occurrences since.   Patient reports he is also having trouble with falling and feeling a little bit unbalanced.  He has worked with physical therapy before and would not mind going back to see if he can get stronger.  Right foot paresthesias blood Patient is also concerned because his right foot falls asleep while he is driving.  He notes that it is better when he stretches his foot out but that when he is having to use a gas pill sometimes he has a numbness sensation in it.  He also notes that he has history of bilateral big toe gout, which is currently controlled on allopurinol and he is not having any issues with.  Erectile dysfunction He also notes that the Viagra previously prescribed did not help him much.  He reports that he took 2 pills and had an erection for several hours but since the first time has not been able to achieve one.  Diabetes Patient reports that he feels like his diabetes is overall well controlled.  He feels that he overall eats well.  Last A1c was 6 months ago.  PERTINENT  PMH / PSH: Reviewed  OBJECTIVE:   BP 129/88    Pulse 78    Ht 5\' 10"  (1.778 m)    Wt 262 lb 6.4 oz (119 kg)    SpO2 97%    BMI 37.65 kg/m   General: NAD, well-appearing, well-nourished Respiratory: No respiratory distress, breathing comfortably, able to speak in full sentences Skin: warm and dry, no rashes noted on exposed skin Psych: Appropriate affect and mood MSK/neuro: 5/5 strength of bilateral lower extremities, mildly abnormal gait pattern with walking with a short stride, does not seem to favor one side or the other during short ambulation. Sensation fully intact in lower  extremities.  ASSESSMENT/PLAN:   Right hip pain   balance issues Patient with reported fall several years ago that has had lasting right hip pain.  Bilateral hip x-rays on 03/24/2020 showed stable moderate bilateral hip osteoarthritis.  Lumbar x-rays in 04/23/2020 showed degenerative changes in the lumbar spine without acute displaced fractures.  At this time, do not feel that there is further need for work-up.  Patient has good strength and is able to ambulate moderately well.  Do recommend physical therapy, patient is partial to the physical therapy group he was working with previously. - Referral to physical therapy - Tylenol ibuprofen as needed for pain - Refilled Voltaren gel  Intermittent paresthesias of the right foot Appears secondary to the constant movements/positions of the right foot during driving.  Counseled patient on good positioning while in the car, to avoid having his wallet or other items in his pockets while driving.  Recommended cruise control when safe and able to on the highway as well as consistent stretching exercises and frequent breaks while driving as needed.  Considered sciatica, patient does not seem to have pain that radiates down from his hip to his foot at this time and is isolated to the foot. - Continue to monitor, do not think there is medication requirement at this time - PT referral made for hip  pain, may possibly assist with right foot pain  T2DM (type 2 diabetes mellitus) (HCC) Last A1c 6 months ago was 6.7.  Patient is currently not on any medications and is overall diet controlled.  Patient is prescribed atorvastatin but notes statin intolerance.  Continued education today regarding restarting statin was provided. - Follow-up A1c  Erectile dysfunction Significant ASCVD risk.  Patient not able to tolerate statin medication.  Trialed Viagra, which was not successful in the long run.  Do not plan to prescribe further at this time.   Statin  intolerance Patient reports that he does not take atorvastatin because it "makes his OCD worse" and it was recommended for him to discontinue if this was affecting his mental health.  Reiterated to the patient the importance of statin given his diabetes and high ASCVD risk score which was calculated previously.   Other issues patient mentioned but was not able to address at this time included his dermatologic conditions on his face, for which she has previously followed with dermatology was unable to afford some of the medications.  Instructed patient to follow-up with the clinic in the next several months as needed for these concerns.   Rise Patience, [place]

## 2021-06-25 ENCOUNTER — Other Ambulatory Visit (HOSPITAL_COMMUNITY): Payer: Self-pay | Admitting: *Deleted

## 2021-06-25 ENCOUNTER — Other Ambulatory Visit (HOSPITAL_COMMUNITY): Payer: Self-pay | Admitting: Psychiatry

## 2021-06-25 DIAGNOSIS — F339 Major depressive disorder, recurrent, unspecified: Secondary | ICD-10-CM

## 2021-06-25 LAB — HEMOGLOBIN A1C
Est. average glucose Bld gHb Est-mCnc: 154 mg/dL
Hgb A1c MFr Bld: 7 % — ABNORMAL HIGH (ref 4.8–5.6)

## 2021-06-25 MED ORDER — BENZTROPINE MESYLATE 1 MG PO TABS: 1.0000 mg | ORAL_TABLET | Freq: Every day | ORAL | 0 refills | Status: DC

## 2021-07-08 ENCOUNTER — Other Ambulatory Visit: Payer: Self-pay

## 2021-07-08 ENCOUNTER — Encounter: Payer: Self-pay | Admitting: Physical Therapy

## 2021-07-08 ENCOUNTER — Ambulatory Visit: Payer: Medicare HMO | Attending: Family Medicine | Admitting: Physical Therapy

## 2021-07-08 DIAGNOSIS — R2689 Other abnormalities of gait and mobility: Secondary | ICD-10-CM | POA: Diagnosis not present

## 2021-07-08 DIAGNOSIS — M6281 Muscle weakness (generalized): Secondary | ICD-10-CM | POA: Diagnosis not present

## 2021-07-08 DIAGNOSIS — M25551 Pain in right hip: Secondary | ICD-10-CM | POA: Insufficient documentation

## 2021-07-08 DIAGNOSIS — M25559 Pain in unspecified hip: Secondary | ICD-10-CM | POA: Insufficient documentation

## 2021-07-08 NOTE — Therapy (Signed)
[facility] @ [place], [place], [address] Phone: [phone]   Fax:  [phone]  Physical Therapy Evaluation  Patient Details  Name: [name] MRN: [id number] Date of Birth: 08-Jan-1961 Referring Provider (PT): Dr. Madison Hickman   Encounter Date: 07/08/2021   PT End of Session - 07/08/21 1518     Visit Number 1    Date for PT Re-Evaluation 09/02/21    Authorization Type [employer] Medicare-  no ionto    PT Start Time 1242   late   PT Stop Time 1320    PT Time Calculation (min) 38 min    Activity Tolerance Patient tolerated treatment well             Past Medical History:  Diagnosis Date   A-fib ([place])    Acute right-sided low back pain    Arrhythmia    Chronic a-fib (HCC)    Closed fracture of left distal radius    Contact dermatitis 04/16/2017   Depression    Depression    Hypothyroidism    Low back pain    Morbid obesity, BMI unknown ([place])    Osteoarthritis    oa in bilateral knees   Schizophrenia ([place])    Seizures ([place])    Thyroid disease    Varicose veins     Past Surgical History:  Procedure Laterality Date   ACHILLES TENDON REPAIR  approx. 2004   right foot   CATARACT EXTRACTION     bilateral   OPEN REDUCTION INTERNAL FIXATION (ORIF) DISTAL RADIAL FRACTURE Left 07/21/2018   Procedure: OPEN REDUCTION INTERNAL FIXATION (ORIF) DISTAL RADIAL FRACTURE;  Surgeon: [name], MD;  Location: [facility];  Service: Orthopedics;  Laterality: Left;   stab phlebectomy  Right 11/09/2016   stab phlebectomy R leg by Tinnie Gens MD   TOOTH EXTRACTION      There were no vitals filed for this visit.    Subjective Assessment - 07/08/21 1246     Subjective Hips right > left stiff and painful with rolling over.  Carrying groceries and caught toe on steps and fell on right side 1 year ago.  I like to go to ice hockey games and I have to sit low to use railings.  After an hour my  legs give out.  The last time I went to a game in [place] I fell.    Pertinent History on disability    Limitations Sitting;Walking;Lifting;Standing;House hold activities    How long can you sit comfortably? 30 min max    How long can you walk comfortably? 1/2 mile no device    Patient Stated Goals lose 60#;  work out at [employer]    Currently in Pain? Yes    Pain Score 3     Pain Location Back   woke up with a knot in my back this morning   Pain Orientation Right;Left    Pain Type Chronic pain    Pain Onset More than a month ago    Aggravating Factors  picking things up off the floor;  sitting, walking    Pain Relieving Factors heat helps a little                [facility] PT Assessment - 07/08/21 0001       Assessment   Medical Diagnosis balance and hip problems    Referring Provider (PT) Dr. Madison Hickman    Onset Date/Surgical Date --  as needed   Prior Therapy had PT at [facility] with Delsa Sale;  did mat work rolling; likes Nu-Step      Precautions   Precautions Fall      Restrictions   Weight Bearing Restrictions No      Balance Screen   Has the patient fallen in the past 6 months Yes    How many times? 1   at the hockey game in November after sitting 1 hour   Has the patient had a decrease in activity level because of a fear of falling?  Yes    Is the patient reluctant to leave their home because of a fear of falling?  No      Home Environment   Living Environment Private residence    Type of [place] Access Stairs to enter    Entrance Stairs-Number of Steps 12    Entrance Stairs-Rails Right    Home Layout One level      Prior Function   Level of Independence Independent with basic ADLs    Vocation On disability    Leisure used to play basketball; watch hockey      Observation/Other Assessments   Focus on Therapeutic Outcomes (FOTO)  50%      Posture/Postural Control   Posture/Postural Control No significant limitations      AROM   Overall  AROM Comments dec left cervical rotation 30 degrees;  needs UE assist to cross ankle over knee for putting on socks/shoes    Lumbar Flexion 50   fingertips just below knees   Lumbar Extension 10      Strength   Overall Strength Comments needs UE support for single leg stance but pelvic drop noted especially on right; able to heel and toe raise in standing    Right Hip Flexion 4/5    Right Hip Extension 4/5    Right Hip ABduction 4-/5    Left Hip Flexion 4/5    Left Hip Extension 4/5    Left Hip ABduction 4-/5    Lumbar Flexion 4-/5    Lumbar Extension 4-/5      Ambulation/Gait   Gait Comments no assistive device      Standardized Balance Assessment   Five times sit to stand comments  no hands from 21 inch seat height 18 sec      Berg Balance Test   Sit to Stand Able to stand without using hands and stabilize independently    Standing Unsupported Able to stand safely 2 minutes    Sitting with Back Unsupported but Feet Supported on Floor or Stool Able to sit safely and securely 2 minutes    Stand to Sit Sits safely with minimal use of hands    Transfers Able to transfer safely, minor use of hands    Standing Unsupported with Eyes Closed Able to stand 10 seconds safely    Standing Unsupported with Feet Together Able to place feet together independently and stand 1 minute safely    From Standing, Reach Forward with Outstretched Arm Can reach forward >5 cm safely (2")    From Standing Position, Pick up Object from Floor Able to pick up shoe, needs supervision    From Standing Position, Turn to Look Behind Over each Shoulder Looks behind one side only/other side shows less weight shift    Turn 360 Degrees Able to turn 360 degrees safely in 4 seconds or less    Standing Unsupported, Alternately Place Feet on Step/Stool  Able to stand independently and complete 8 steps >20 seconds    Standing Unsupported, One Foot in Front Able to place foot tandem independently and hold 30 seconds     Standing on One Leg Tries to lift leg/unable to hold 3 seconds but remains standing independently    Total Score 48                        Objective measurements completed on examination: See above findings.       [facility] Adult PT Treatment/Exercise - 07/08/21 0001       Knee/Hip Exercises: Aerobic   Nustep seat 13, L5 6 min on new model                       PT Short Term Goals - 07/08/21 1746       PT SHORT TERM GOAL #1   Title be independent in initial HEP    Time 4    Period Weeks    Status New    Target Date 08/05/21      PT SHORT TERM GOAL #2   Title improve strength to perform 5x sit to stand in < or = to 16seconds to improve safety    Time 4    Period Weeks    Status New      PT SHORT TERM GOAL #3   Title BERG score improved to 50/56 indicating improved safety and reduced fall risk    Time 4    Period Weeks    Status New               PT Long Term Goals - 07/08/21 1748       PT LONG TERM GOAL #1   Title be independent in advanced HEP and prepare for his plan to join [employer]    Time 8    Period Weeks    Status New    Target Date 09/02/21      PT LONG TERM GOAL #2   Title Pt will be able to perform multi-directional stepping and reaching strategies without LOB    Time 8    Period Weeks    Status New      PT LONG TERM GOAL #3   Title Improved trunk and hip strength to grossly 4+/5 needed for ascend and descend stairs and curbs safely    Time 8    Period Weeks    Status New      PT LONG TERM GOAL #4   Title The patient will report a 50% improvement in LE symptoms with sitting at hockey games (with strategies to standing every 15 min as needed)    Time 8    Period Weeks    Status New      PT LONG TERM GOAL #5   Title FOTO score improved from 50% to 58% indicating improved function and decreased pain.    Time 8    Period Weeks    Status New      Additional Long Term Goals   Additional Long Term Goals  Yes      PT LONG TERM GOAL #6   Title BERG score improved to 52/56    Time 8    Period Weeks    Status New                    Plan - 07/08/21 1519     Clinical  Impression Statement The patient reports right > left lateral hip pain which started about a year ago when he fell on that side when his foot caught on the step while carrying groceries. He reports his right LE "goes to sleep" with sitting more than 30 minutes and will give way upon standing.  He had an incident at the hockey game a couple of months ago when he fell upon rising for this reason.  He states he has difficulty picking things up off the floor, walking longer distances (his foot issues and knee pain also affect this) and has difficulty with curbs/steps.  Decreased lumbar and hip strength grossly 4-/5 to 4/5.  He is able to rise from a standard height chair without UE assist with some difficulty.  5x sit to stand test 18 sec.  BERG balance score is 48/56 indicating a moderate 50% risk of falls.   Single limb stance activities impaired.  He would benefit from PT treatment to address right hip pain and weakness and ex's to improve his balance.    Personal Factors and Comorbidities Comorbidity 1;Comorbidity 2;Comorbidity 3+;Time since onset of injury/illness/exacerbation;Fitness    Comorbidities on disability; diabetes; A-fib    Examination-Activity Limitations Locomotion Level;Transfers;Bed Mobility;Lift;Stand;Stairs;Squat;Carry    Examination-Participation Restrictions Meal Prep;Other;Community Activity;Shop;Driving    Stability/Clinical Decision Making Stable/Uncomplicated    Clinical Decision Making Low    Rehab Potential Good    PT Frequency 2x / week    PT Duration 8 weeks    PT Treatment/Interventions ADLs/Self Care Home Management;Aquatic Therapy;Cryotherapy;Electrical Stimulation;Ultrasound;Traction;Moist Heat;Therapeutic activities;Therapeutic exercise;Neuromuscular re-education;Manual techniques;Patient/family  education;Dry needling;Taping    PT Next Visit Plan Nu-Step; seated and standing lumbo/hip strengthening progression for help with a successful transition to the gym after course of PT; hip and spinal mobility ex's; single leg and reaching balance ex's    Consulted and Agree with Plan of Care Patient             Patient will benefit from skilled therapeutic intervention in order to improve the following deficits and impairments:  Decreased range of motion, Pain, Decreased activity tolerance, Decreased strength, Impaired perceived functional ability  Visit Diagnosis: Pain in right hip - Plan: PT plan of care cert/re-cert  Muscle weakness (generalized) - Plan: PT plan of care cert/re-cert  Other abnormalities of gait and mobility - Plan: PT plan of care cert/re-cert     Problem List Patient Active Problem List   Diagnosis Date Noted   Statin intolerance 06/24/2021   Left knee pain 07/23/2020   Spinal stenosis of lumbar region 01/16/2020   Chronic lumbar radiculopathy 12/08/2019   Hyperlipidemia associated with type 2 diabetes mellitus ([place]) 12/08/2019   Dizziness 11/03/2019   Paronychia of left index finger 05/19/2019   Fall at home 11/24/2018   Rosacea 10/24/2018   Loss of weight 09/09/2018   Schizoaffective disorder, depressive type ([place]) 04/08/2018   Varicose veins of bilateral lower extremities with other complications 47/42/5956   Erectile dysfunction 04/26/2015   Hypothyroidism    Seizures ([place])    Persistent atrial fibrillation (HCC)    BMI 37.0-37.9, adult    Seborrheic dermatitis 02/10/2007   T2DM (type 2 diabetes mellitus) ([place]) 02/10/2007   Ruben Im, PT 07/08/21 5:55 PM Phone: [phone] Fax: [phone]  Alvera Singh, PT 07/08/2021, 5:54 PM  [facility] @ [address] [place], [place], [address] Phone: [phone]   Fax:  [phone]  Name: [name] MRN: [id number] Date of  Birth: Nov 28, 1960

## 2021-07-29 ENCOUNTER — Telehealth: Payer: Self-pay | Admitting: Physical Therapy

## 2021-07-29 NOTE — Telephone Encounter (Signed)
Called to follow up regarding scheduling appts after the evaluation.  The patient states he his going to ortho to have his hip checked out b/c he's in excruciating pain when he rolls over.  He also states he "knows all the routines" from past PT and doesn't know if more PT is necessary.  We agreed that if he had not called to schedule in the next 2 weeks, then I would discharge/close out plan of care.

## 2021-07-30 ENCOUNTER — Other Ambulatory Visit: Payer: Self-pay

## 2021-07-30 ENCOUNTER — Ambulatory Visit (INDEPENDENT_AMBULATORY_CARE_PROVIDER_SITE_OTHER): Payer: Medicare HMO | Admitting: Family Medicine

## 2021-07-30 VITALS — BP 130/89 | HR 80 | Ht 70.0 in | Wt 258.5 lb

## 2021-07-30 DIAGNOSIS — M159 Polyosteoarthritis, unspecified: Secondary | ICD-10-CM | POA: Diagnosis not present

## 2021-07-30 DIAGNOSIS — E119 Type 2 diabetes mellitus without complications: Secondary | ICD-10-CM

## 2021-07-30 DIAGNOSIS — M25559 Pain in unspecified hip: Secondary | ICD-10-CM | POA: Diagnosis not present

## 2021-07-30 MED ORDER — BLOOD GLUCOSE MONITOR KIT: PACK | 0 refills | Status: DC

## 2021-07-30 MED ORDER — DICLOFENAC SODIUM 1 % EX GEL: 4.0000 g | Freq: Four times a day (QID) | CUTANEOUS | 0 refills | Status: DC

## 2021-07-30 NOTE — Patient Instructions (Addendum)
Call [facility] to schedule an appointment to discuss your pain  [facility] [phone] [address] [place], [place] [address]  For your hip pain, Take 1500 mg Tylenol twice a day. Be sure to stop by the pharmacy to pick up Lidocaine patches. Apply for 12 hours and then remove.

## 2021-07-30 NOTE — Progress Notes (Signed)
° °  SUBJECTIVE:   CHIEF COMPLAINT / HPI:    [name] is a 61 y.o. male with history of osteoarthritis and lumbar radiculopathy here for fall that occurred several months ago and continued bilateral hip pain. Reports carrying groceries into the home and his foot caught a lip of the steps causing him to fall. Impact was to his right hip.Right hurting worse than left with associated bilateral knee pain. Taking two Tylenol "sporadically" for pain. He has completed physical therapy at [facility].  Has trouble rolling over in the bed due to pain.        PERTINENT  PMH / PSH: reviewed and updated as appropriate   OBJECTIVE:   BP 130/89    Pulse 80    Ht 5\' 10"  (1.778 m)    Wt 258 lb 8 oz (117.3 kg)    SpO2 96%    BMI 37.09 kg/m    GEN: well appearing male in no acute distress  MSK: Short stride, slow gait, able to go from sitting to standing without difficulty, knee without effusion or edema, good bilateral ROM (at baseline per pt), joint line tenderness medial > later,  limited right hip flexion and extension ?2/2, + FADIR bilaterally, strength 4/5, +tenderness over greater trochanter SKIN: warm, dry, notable flaky facial rash   ASSESSMENT/PLAN:   Hip pain Per chart review, pt with history of recurrent falls. Bilateral OA seen on pelvic x-rays in Sept 2021. Given report of recent PT, discussed follow up with orthopedic surgeon for hip injection and/or evaluation for surgery. History of pain while laying in bed and tenderness over right greater trochanter c/w bursitis. Pt seen by [facility] for wrist fracture. Provided contact information. Referral placed. Recommended Voltaren gel, 1500 mg Tylenol BID and Lidocaine patches. Pt agreeable with this plan.   T2DM (type 2 diabetes mellitus) ([place]) Glucometer sent to pharmacy, per pt's request      Lyndee Hensen, DO PGY-3, [facility] 07/30/2021

## 2021-07-31 NOTE — Assessment & Plan Note (Signed)
Glucometer sent to pharmacy, per pt's request

## 2021-07-31 NOTE — Assessment & Plan Note (Addendum)
Per chart review, pt with history of recurrent falls. Bilateral OA seen on pelvic x-rays in Sept 2021. Given report of recent PT, discussed follow up with orthopedic surgeon for hip injection and/or evaluation for surgery. History of pain while laying in bed and tenderness over right greater trochanter c/w bursitis. Pt seen by [facility] for wrist fracture. Provided contact information. Referral placed. Recommended Voltaren gel, 1500 mg Tylenol BID and Lidocaine patches. Pt agreeable with this plan.

## 2021-08-01 ENCOUNTER — Other Ambulatory Visit: Payer: Self-pay

## 2021-08-01 MED ORDER — LEVOTHYROXINE SODIUM 100 MCG PO TABS: 100.0000 ug | ORAL_TABLET | Freq: Every day | ORAL | 3 refills | Status: DC

## 2021-08-07 ENCOUNTER — Other Ambulatory Visit: Payer: Self-pay

## 2021-08-07 ENCOUNTER — Encounter (HOSPITAL_COMMUNITY): Payer: Self-pay | Admitting: Psychiatry

## 2021-08-07 ENCOUNTER — Ambulatory Visit (HOSPITAL_BASED_OUTPATIENT_CLINIC_OR_DEPARTMENT_OTHER): Payer: Medicare HMO | Admitting: Psychiatry

## 2021-08-07 VITALS — BP 154/96 | HR 88 | Temp 98.6°F | Resp 20 | Ht 70.0 in | Wt 256.6 lb

## 2021-08-07 DIAGNOSIS — R69 Illness, unspecified: Secondary | ICD-10-CM | POA: Diagnosis not present

## 2021-08-07 DIAGNOSIS — F339 Major depressive disorder, recurrent, unspecified: Secondary | ICD-10-CM | POA: Diagnosis not present

## 2021-08-07 DIAGNOSIS — R251 Tremor, unspecified: Secondary | ICD-10-CM | POA: Diagnosis not present

## 2021-08-07 DIAGNOSIS — F419 Anxiety disorder, unspecified: Secondary | ICD-10-CM | POA: Diagnosis not present

## 2021-08-07 MED ORDER — BENZTROPINE MESYLATE 1 MG PO TABS: 1.0000 mg | ORAL_TABLET | Freq: Every day | ORAL | 0 refills | Status: DC

## 2021-08-07 MED ORDER — PERPHENAZINE 8 MG PO TABS: 24.0000 mg | ORAL_TABLET | Freq: Every day | ORAL | 0 refills | Status: DC

## 2021-08-07 MED ORDER — DEPAKOTE ER 250 MG PO TB24: 500.0000 mg | ORAL_TABLET | Freq: Two times a day (BID) | ORAL | 0 refills | Status: DC

## 2021-08-07 MED ORDER — PHENELZINE SULFATE 15 MG PO TABS: 45.0000 mg | ORAL_TABLET | Freq: Two times a day (BID) | ORAL | 0 refills | Status: DC

## 2021-08-07 NOTE — Progress Notes (Signed)
Location: Patient: Office Provider: Office    History of Present Illness: Patient came in person for his appointment.  He stopped taking Ingrezza because he was told it is contraindicated with his Nardil.  He still have tremors but does not interfere in his daily functioning.  He had a good Christmas he spent time with his mother.  He feels his depression is chronic and sometimes comes and goes but overall things are going okay.  He denies any crying spells, hallucination, paranoia or any suicidal thought.  He has now a new PCP.  He has not seen neurology but more concerned about his skin.  He was seeing a dermatologist who was prescribing medicine for his skin lesion but he was not happy and now he is looking for a new dermatologist.  He reported no further episodes of confusion, dizziness or forgetfulness but overall his memory is not good but he believes due to his age.  He does go outside when weather is good.  He lost few pounds since the last visit.  His last hemoglobin A1c was 7.0.  He is not checking his blood sugar regularly.  He denies any suicidal thoughts and like to keep his current medication.  Past Psychiatric History: Reviewed. H/O multiple inpatient from [phone] for severe depression, suicidal thoughts. No h/o suicidal attempt. H/O OCD, schizoaffective disorder and major depression.  Tried numerous medication including TCAs.  We tried Lamictal but he develop a rash.  Recent Results (from the past 2160 hour(s))  Hemoglobin A1c     Status: Abnormal   Collection Time: 06/24/21  2:02 PM  Result Value Ref Range   Hgb A1c MFr Bld 7.0 (H) 4.8 - 5.6 %    Comment:          Prediabetes: 5.7 - 6.4          Diabetes: >6.4          Glycemic control for adults with diabetes: <7.0    Est. average glucose Bld gHb Est-mCnc 154 mg/dL     Psychiatric Specialty Exam: Physical Exam  Review of Systems  Blood pressure (!) 154/96, pulse 88, temperature 98.6 F (37 C), temperature source Oral,  resp. rate 20, height 5\' 10"  (1.778 m), weight 256 lb 9.6 oz (116.4 kg), SpO2 92 %.There is no height or weight on file to calculate BMI.  General Appearance: Fairly Groomed  Eye Contact:  Fair  Speech:  Slow  Volume:  Normal  Mood:  Euthymic  Affect:  Congruent  Thought Process:  Descriptions of Associations: Intact  Orientation:  Full (Time, Place, and Person)  Thought Content:  WDL  Suicidal Thoughts:  No  Homicidal Thoughts:  No  Memory:  Immediate;   Good Recent;   Fair Remote;   Fair  Judgement:  Intact  Insight:  Shallow  Psychomotor Activity:  Tremor  Concentration:  Concentration: Fair and Attention Span: Fair  Recall:  [employer] of Knowledge:  Fair  Language:  Fair  Akathisia:  Yes  Handed:  Right  AIMS (if indicated):     Assets:  Communication Skills Desire for Improvement Housing Transportation  ADL's:  Intact  Cognition:  WNL  Sleep:   ok      Assessment and Plan: Major depressive disorder, recurrent.  Anxiety.  Discontinued Ingrezza due to concern of side effects and contraindications with Nardil.  Patient like to keep his current medication which is Trilafon 24 mg at bedtime, Nardil 45 mg daily twice daily, Depakote 500 mg  twice a day and Cogentin 1 mg at bedtime.  He is not interested in therapy.  Recommended to call us back with any question or any concern.  I encouraged if he started to have confusion and memory issues worsening then he should consider seeing neurology.  Follow Up Instructions:    I discussed the assessment and treatment plan with the patient. The patient was provided an opportunity to ask questions and all were answered. The patient agreed with the plan and demonstrated an understanding of the instructions.   The patient was advised to call back or seek an in-person evaluation if the symptoms worsen or if the condition fails to improve as anticipated.     Kathlee Nations, MD

## 2021-08-11 ENCOUNTER — Other Ambulatory Visit: Payer: Self-pay | Admitting: Family Medicine

## 2021-08-12 ENCOUNTER — Telehealth (HOSPITAL_COMMUNITY): Payer: Self-pay | Admitting: *Deleted

## 2021-08-12 NOTE — Telephone Encounter (Signed)
Pt called requesting a return call from you as he states that the more he works out, the more depressed he gets. Pt says this has been happeneing for the "past 20 years" every time he tries to lose weight. Pt states didn't work out yesterday and feels better today so would like your input on this. Nurse discussed watching blood sugar levels, elevated HgbA1C, healthy eating habits, sleep hygiene as he say when he works out he has difficulty sleeping. Please review.

## 2021-08-13 ENCOUNTER — Telehealth (HOSPITAL_COMMUNITY): Payer: Self-pay

## 2021-08-13 ENCOUNTER — Ambulatory Visit (INDEPENDENT_AMBULATORY_CARE_PROVIDER_SITE_OTHER): Payer: Medicare HMO | Admitting: Orthopaedic Surgery

## 2021-08-13 ENCOUNTER — Other Ambulatory Visit (HOSPITAL_COMMUNITY): Payer: Self-pay | Admitting: Psychiatry

## 2021-08-13 ENCOUNTER — Ambulatory Visit (INDEPENDENT_AMBULATORY_CARE_PROVIDER_SITE_OTHER): Payer: Medicare HMO

## 2021-08-13 ENCOUNTER — Encounter: Payer: Self-pay | Admitting: Orthopaedic Surgery

## 2021-08-13 ENCOUNTER — Other Ambulatory Visit: Payer: Self-pay

## 2021-08-13 VITALS — BP 143/88 | HR 86 | Ht 70.0 in | Wt 255.2 lb

## 2021-08-13 DIAGNOSIS — M25561 Pain in right knee: Secondary | ICD-10-CM

## 2021-08-13 DIAGNOSIS — M25562 Pain in left knee: Secondary | ICD-10-CM

## 2021-08-13 DIAGNOSIS — M160 Bilateral primary osteoarthritis of hip: Secondary | ICD-10-CM | POA: Diagnosis not present

## 2021-08-13 DIAGNOSIS — M25551 Pain in right hip: Secondary | ICD-10-CM | POA: Diagnosis not present

## 2021-08-13 DIAGNOSIS — G8929 Other chronic pain: Secondary | ICD-10-CM

## 2021-08-13 DIAGNOSIS — F339 Major depressive disorder, recurrent, unspecified: Secondary | ICD-10-CM

## 2021-08-13 DIAGNOSIS — R251 Tremor, unspecified: Secondary | ICD-10-CM

## 2021-08-13 NOTE — Telephone Encounter (Signed)
Cal R/T. I recommended to do exercise once or twice week to get accustomed to the routine.  He admitted like to do exercise so he can lose weight.  He had lost 3 pounds since exercising.  I encourage maybe slowly and gradually getting routine may help.  However anytime having depression get worse to start having suicidal thoughts then he need to stop and let us know immediately.

## 2021-08-13 NOTE — Progress Notes (Signed)
Office Visit Note   Patient: [name]           Date of Birth: 1960/09/05           MRN: [id number] Visit Date: 08/13/2021              Requested by: McDiarmid, Blane Ohara, MD [address] [place],  [place] [address] PCP: Rise Patience, DO   Assessment & Plan: Visit Diagnoses:  1. Chronic pain of both knees   2. Pain in right hip   3. Bilateral primary osteoarthritis of hip     Plan: Patient is lost some weight with exercise activity.  If he has to get down on his knees will use some pads or can get some knee pads used by people do flooring or tile work if needed.  He could use alternately folded blanket if he had to get down on his knees at home for some activity.  He has bilateral hip osteoarthritis mildly progressed over the last few years but primarily bothers him at night and is not particularly painful when he walks.  He will return if he has increased problems with his knees or hips.  We reviewed the x-rays of his knees which showed some mild to moderate osteoarthritis.  Follow-Up Instructions: Return if symptoms worsen or fail to improve.   Orders:  Orders Placed This Encounter  Procedures   XR Knee 1-2 Views Right   XR Knee 1-2 Views Left   XR Pelvis 1-2 Views   No orders of the defined types were placed in this encounter.     Procedures: No procedures performed   Clinical Data: No additional findings.   Subjective: Chief Complaint  Patient presents with   multiple joint pain    HPI 61 year old male history of schizophrenia on medication with some tardive dyskinesia tremor is here with bilateral knee pain has had problems when he gets down on his knee and points to the tibial tubercles which has spike of bone prominent at the patellar tendon insertion.  He has been doing an exercise activity with a sitdown bike which also involves leg pedaling.  He has had some pain in his groin at times particularly at night and previous x-rays 3 years ago showed  hip osteoarthritis.  He has noticed stiffness in his hip.  He has used diclofenac gel which has been somewhat helpful.  He has problems when he gets down on his knees and states he has sharp pain and points of the prominent tibial tubercle right and left.  No history of Osgood-Schlatter's.  Patient does not think that the bone prominence has been present chronically but is not sure.  Review of Systems positive for atrial fibs schizoaffective disorder on medication.  Chronic anticoagulation for A-fib.  Increased BMI hip osteoarthritis.  All other systems noncontributory to HPI.   Objective: Vital Signs: BP (!) 143/88    Pulse 86    Ht 5\' 10"  (1.778 m)    Wt 255 lb 3.2 oz (115.8 kg)    BMI 36.62 kg/m   Physical Exam Constitutional:      Appearance: He is well-developed.  HENT:     Head: Normocephalic and atraumatic.     Right Ear: External ear normal.     Left Ear: External ear normal.  Eyes:     Pupils: Pupils are equal, round, and reactive to light.  Neck:     Thyroid: No thyromegaly.     Trachea: No tracheal deviation.  Cardiovascular:     Rate and Rhythm: Normal rate.  Pulmonary:     Effort: Pulmonary effort is normal.     Breath sounds: No wheezing.  Abdominal:     General: Bowel sounds are normal.     Palpations: Abdomen is soft.  Musculoskeletal:     Cervical back: Neck supple.  Skin:    General: Skin is warm and dry.     Capillary Refill: Capillary refill takes less than 2 seconds.  Neurological:     Mental Status: He is alert and oriented to person, place, and time.  Psychiatric:        Behavior: Behavior normal.        Thought Content: Thought content normal.        Judgment: Judgment normal.    Ortho Exam some none intention tremor upper extremity hands tardive dyskinesia type.  No intention tremor.  He ambulates with a short stride gait.  No internal rotation right or left hip.  He can figure 4 in sitting position almost completely maintaining external rotation  both hips.  Prominent tibial tubercle prominent left greater than right.  No tenderness of the patellar tendon mild crepitus with knee extension.  Specialty Comments:  No specialty comments available.  Imaging: XR Knee 1-2 Views Left  Result Date: 08/13/2021 Standing AP both knees lateral x-ray left knee demonstrates calcification tibial tubercle patellar tendon insertion site.  Knee osteoarthritis with some joint space narrowing 50% noted. Impression: Left knee mild to moderate OA.  Old Osgood-Schlatter changes versus distal patellar tendon calcific tendinopathy.  XR Knee 1-2 Views Right  Result Date: 08/13/2021 Standing AP both knees lateral right knee demonstrates calcification patellar tendon insertion site tibial tubercle.  Mild joint space narrowing consistent with mild osteoarthritis. Impression: Tibial tubercle prominence at the patellar tendon insertion site.  Mild knee osteoarthritis right knee.  XR Pelvis 1-2 Views  Result Date: 08/13/2021 AP pelvis showing both hips demonstrates marginal osteophytes joint space narrowing without subchondral cyst formation with bilateral hip osteoarthritis. Impression: Bilateral knee osteoarthritis minimal progression since 2021.    PMFS History: Patient Active Problem List   Diagnosis Date Noted   Bilateral primary osteoarthritis of hip 08/13/2021   Statin intolerance 06/24/2021   Left knee pain 07/23/2020   Hip pain 01/26/2020   Spinal stenosis of lumbar region 01/16/2020   Chronic lumbar radiculopathy 12/08/2019   Hyperlipidemia associated with type 2 diabetes mellitus ([place]) 12/08/2019   Dizziness 11/03/2019   Paronychia of left index finger 05/19/2019   Fall at home 11/24/2018   Rosacea 10/24/2018   Loss of weight 09/09/2018   Schizoaffective disorder, depressive type ([place]) 04/08/2018   Varicose veins of bilateral lower extremities with other complications 67/06/4579   Erectile dysfunction 04/26/2015   Hypothyroidism    Seizures  ([place])    Persistent atrial fibrillation (H[facility])    BMI 37.0-37.9, adult    Seborrheic dermatitis 02/10/2007   T2DM (type 2 diabetes mellitus) ([place]) 02/10/2007   Past Medical History:  Diagnosis Date   A-fib ([place])    Acute right-sided low back pain    Arrhythmia    Chronic a-fib (H[facility])    Closed fracture of left distal radius    Contact dermatitis 04/16/2017   Depression    Depression    Hypothyroidism    Low back pain    Morbid obesity, BMI unknown ([place])    Osteoarthritis    oa in bilateral knees   Schizophrenia ([place])    Seizures ([place])  Thyroid disease    Varicose veins     Family History  Problem Relation Age of Onset   Diabetes Father    Heart disease Father    Diabetes Brother    OCD Mother     Past Surgical History:  Procedure Laterality Date   ACHILLES TENDON REPAIR  approx. 2004   right foot   CATARACT EXTRACTION     bilateral   OPEN REDUCTION INTERNAL FIXATION (ORIF) DISTAL RADIAL FRACTURE Left 07/21/2018   Procedure: OPEN REDUCTION INTERNAL FIXATION (ORIF) DISTAL RADIAL FRACTURE;  Surgeon: [name], MD;  Location: [facility];  Service: Orthopedics;  Laterality: Left;   stab phlebectomy  Right 11/09/2016   stab phlebectomy R leg by Tinnie Gens MD   TOOTH EXTRACTION     Social History   Occupational History   Occupation: disabled  Tobacco Use   Smoking status: Never   Smokeless tobacco: Never  Vaping Use   Vaping Use: Never used  Substance and Sexual Activity   Alcohol use: No    Alcohol/week: 0.0 standard drinks   Drug use: No   Sexual activity: Never

## 2021-08-13 NOTE — Telephone Encounter (Signed)
Patient called again and requested a call from you before he leaves to go out of town. He wants a call back on his cell phone number at [phone]. Thank you

## 2021-09-08 ENCOUNTER — Telehealth (HOSPITAL_COMMUNITY): Payer: Self-pay | Admitting: *Deleted

## 2021-09-08 NOTE — Telephone Encounter (Signed)
Pt called with c/o having what he thinks was a "temporal lobe seizure". Pt says he has not had one since he was in he's 20's. Pt refuses to go to UC or have [place] refer to neurology. Pt also describes increase in crying spells and some anhedonia.  Pt worries that he has dementia but says neurology doesn't seem to take him seriously. Pt took extra dose of Depakote last night he said as he ws afraid of another seizure. Please review and advise.  ?

## 2021-09-10 ENCOUNTER — Telehealth (HOSPITAL_COMMUNITY): Payer: Self-pay | Admitting: *Deleted

## 2021-09-10 NOTE — Telephone Encounter (Signed)
Writer received another message from pt regarding wanting to speak with Dr. Adele Schilder about the "temporal lobe seizure" he feels he experienced recently. Pt stated that he's "feeling better" but still having decreased focus. Writer left a return VM advising that Dr.Arfeen will be back tomorrow. Pt previously stated that he was comfortable to wait to talk to you directly. ?

## 2021-09-12 ENCOUNTER — Other Ambulatory Visit (HOSPITAL_COMMUNITY): Payer: Self-pay | Admitting: *Deleted

## 2021-09-12 ENCOUNTER — Telehealth (HOSPITAL_COMMUNITY): Payer: Self-pay | Admitting: *Deleted

## 2021-09-12 DIAGNOSIS — Z5181 Encounter for therapeutic drug level monitoring: Secondary | ICD-10-CM

## 2021-09-12 IMAGING — CT CT HEAD W/O CM
3 series · 14 of 47 positions shown, 16 images · non-contrast
Comparison: 07/16/2018

CLINICAL DATA: Fall from bed with head injury. Rolled out of bed 2
nights ago striking left side head on night stand. Headache today.

EXAM:
CT HEAD WITHOUT CONTRAST
TECHNIQUE: Contiguous axial images were obtained from the base of the skull
through the vertex without intravenous contrast.

[Series 2: head wo · axial · 0.47mm/px · z∈[-213,-73]mm · 8 of 34 slices shown, 10 images]
[im 3/34  brain]
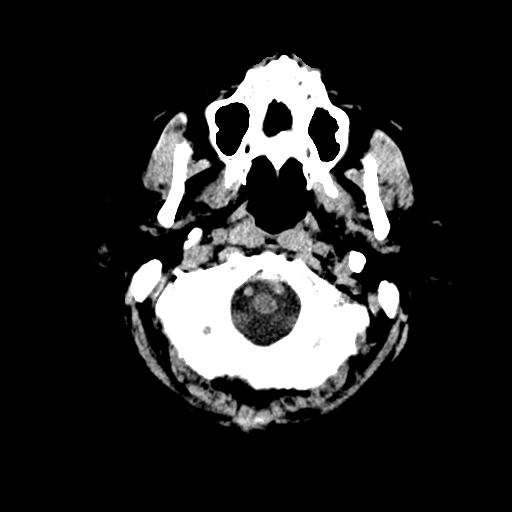
[im 3/34  bone]
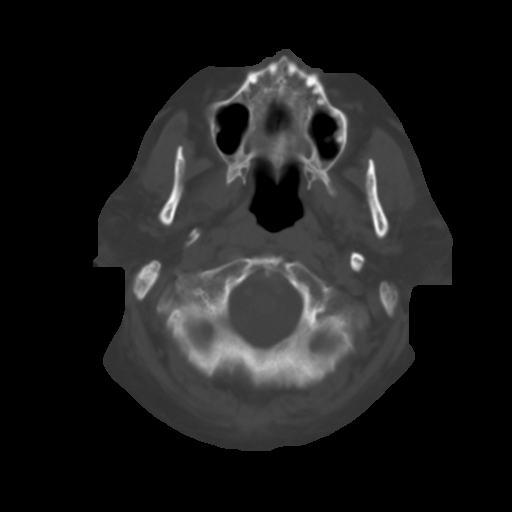
[im 7/34  brain]
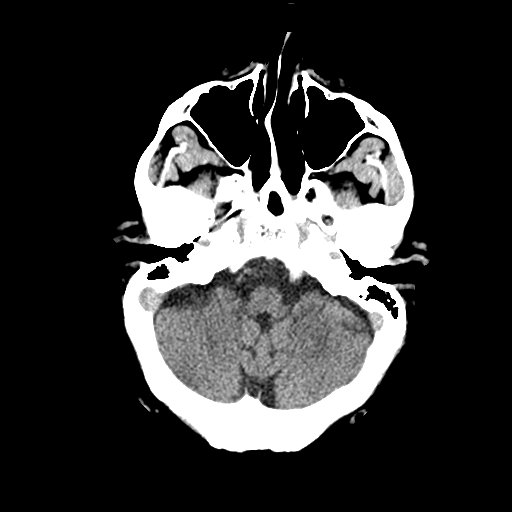
[im 11/34  brain]
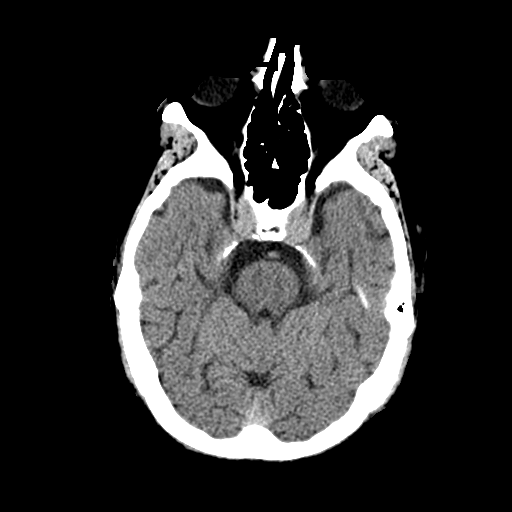
[im 15/34  brain]
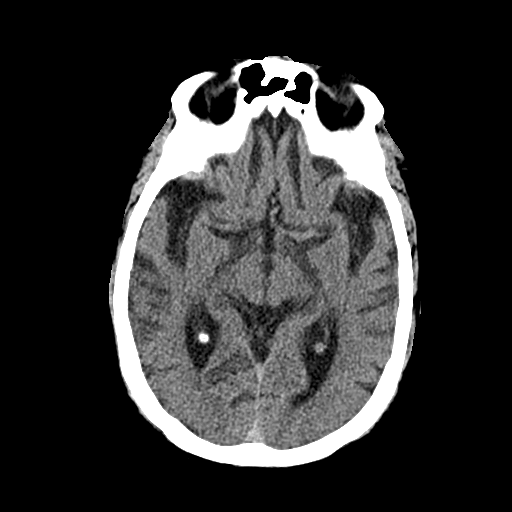
[im 19/34  brain]
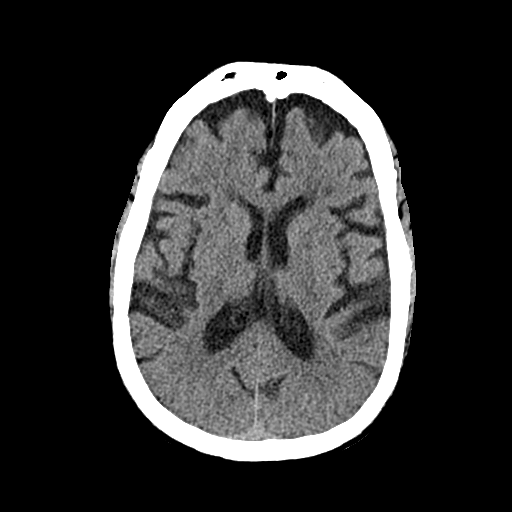
[im 19/34  bone]
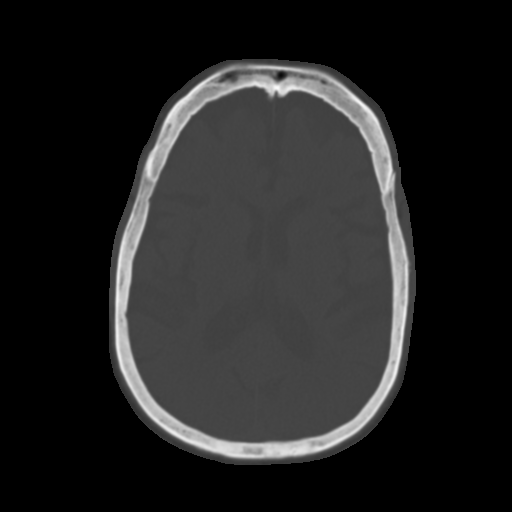
[im 23/34  brain]
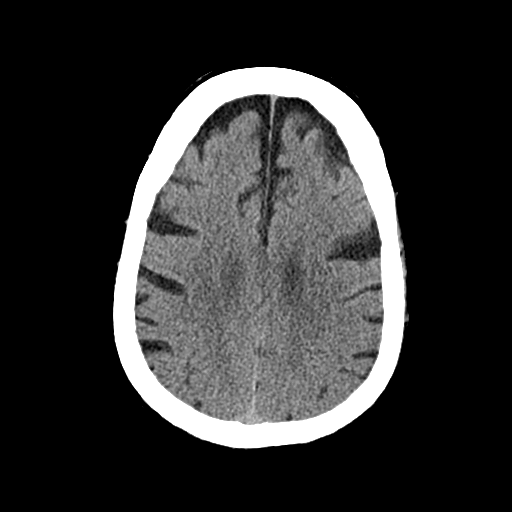
[im 27/34  brain]
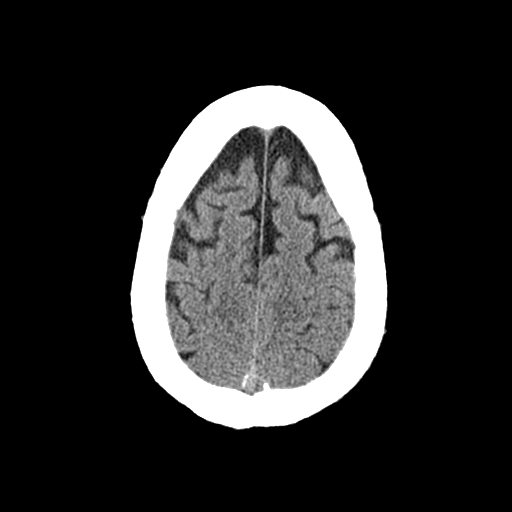
[im 31/34  brain]
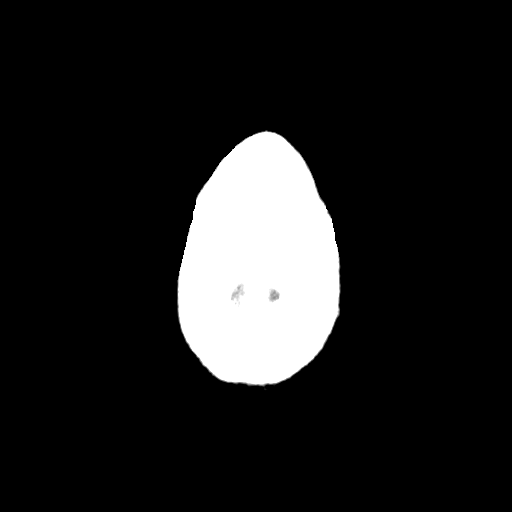

[Series 4: cor soft · coronal · 0.35mm/px · 3 of 76 slices shown]
[im 26/76  brain]
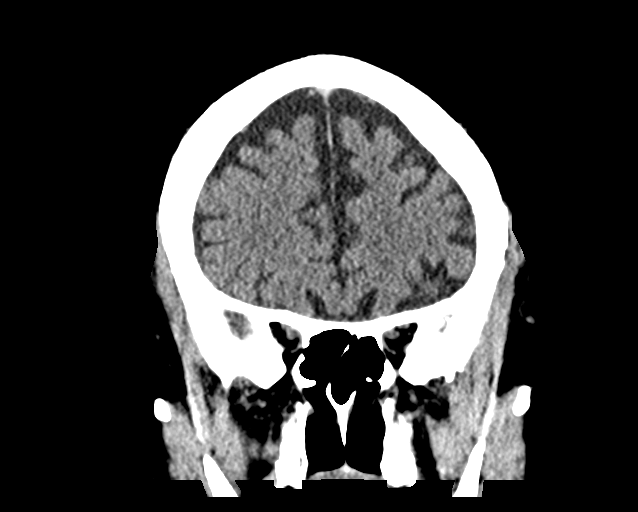
[im 34/76  brain]
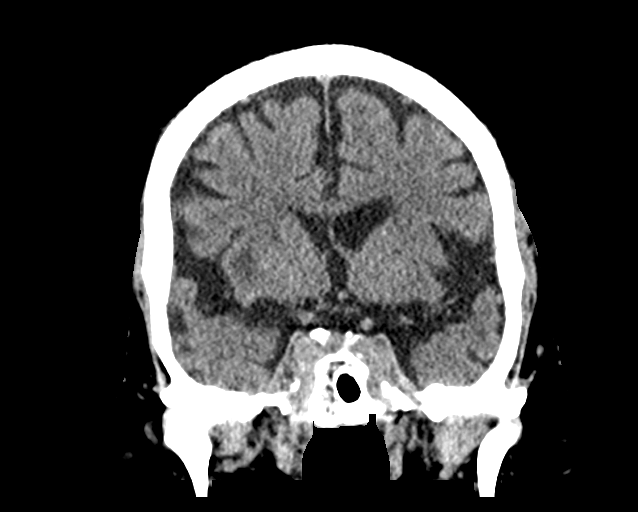
[im 42/76  brain]
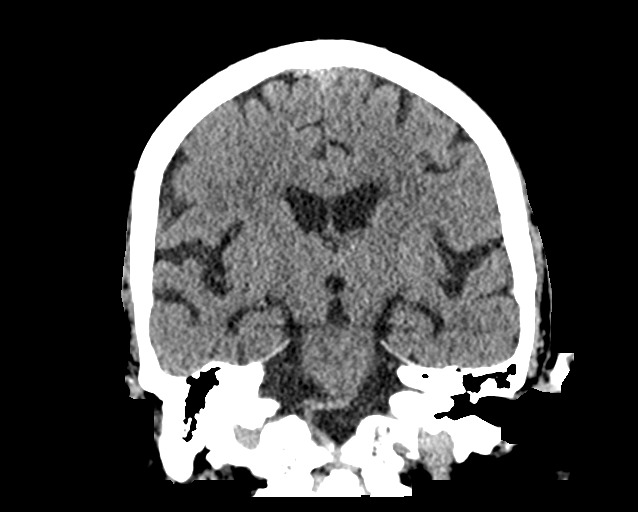

[Series 5: sag soft · sagittal · 0.37mm/px · 3 of 59 slices shown]
[im 20/59  brain]
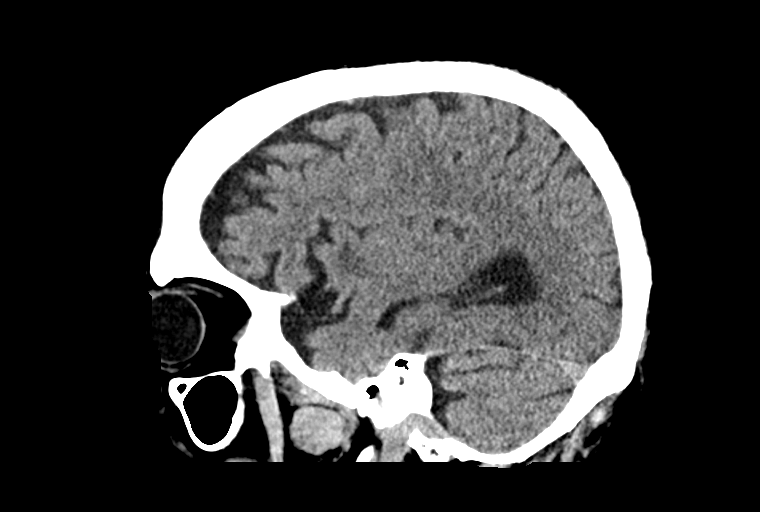
[im 30/59  brain]
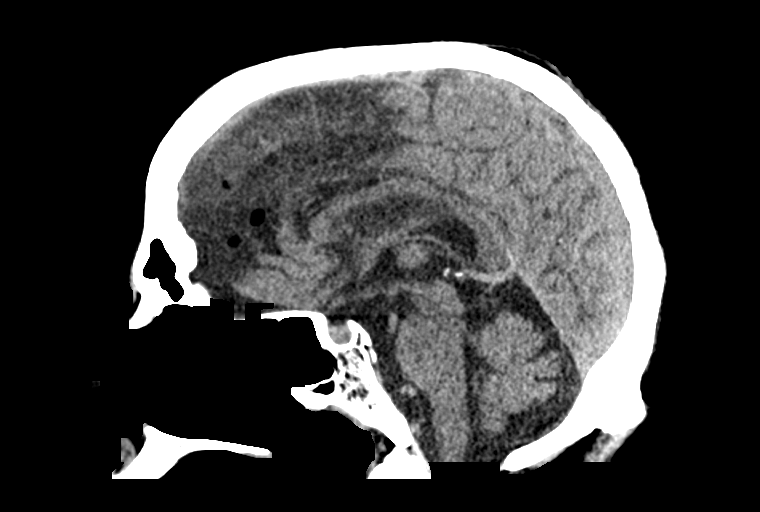
[im 39/59  brain]
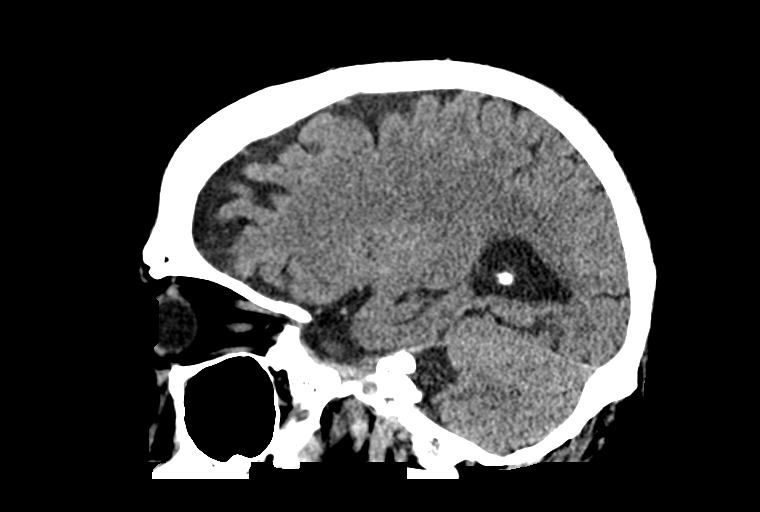

[14 of 47 positions shown; findings below may reference images not displayed]

FINDINGS: Brain: Stable but age advanced atrophy. Unchanged chronic small
vessel ischemia. No intracranial hemorrhage, mass effect, or midline
shift. No hydrocephalus. The basilar cisterns are patent. No
evidence of territorial infarct or acute ischemia. No extra-axial or
intracranial fluid collection.

Vascular: No hyperdense vessel.

Skull: No fracture or focal lesion.

Sinuses/Orbits: Paranasal sinuses and mastoid air cells are clear.
The visualized orbits are unremarkable. Bilateral cataract
resection.

Other: None.
IMPRESSION: 1. No acute intracranial abnormality. No skull fracture.
2. Stable atrophy and chronic small vessel ischemia.

## 2021-09-12 NOTE — Telephone Encounter (Signed)
Writer spoke with pt advising him that lab orders for Valproic Acid level have been ordered, sent to [facility] at [place] advised to hold morning dose of Depakote prior to blood draw. Pt verbalizes understanding. ?

## 2021-09-12 NOTE — Telephone Encounter (Signed)
I returned patient's phone call.  He told last week he felt that he is confused and not clear in his thinking.  He recalled he has these symptoms when he was in 37s and he was given Depakote and diagnosed with temporal lobe seizures.  He denies any suicidal thoughts, hallucination, paranoia and he did not lost consciousness.  He took extra Depakote last week and now he is feeling much better.  He took only once.  He is concerned about his symptoms.  He is reluctant to see neurology however after some discussion he agreed to send a referral.  I also recommend he should get a new Depakote level if it is in therapeutic range.  He is going for blood work on Monday for other lab test.  We will add Depakote level and if level is therapeutic then we will send referral to neurology for the management of his seizure-like activities.  Patient agreed with the plan. ?

## 2021-09-15 ENCOUNTER — Ambulatory Visit (INDEPENDENT_AMBULATORY_CARE_PROVIDER_SITE_OTHER): Payer: Medicare HMO | Admitting: Family Medicine

## 2021-09-15 ENCOUNTER — Encounter: Payer: Self-pay | Admitting: Family Medicine

## 2021-09-15 ENCOUNTER — Other Ambulatory Visit: Payer: Self-pay

## 2021-09-15 VITALS — BP 123/86 | HR 80 | Ht 70.0 in | Wt 256.0 lb

## 2021-09-15 DIAGNOSIS — M5416 Radiculopathy, lumbar region: Secondary | ICD-10-CM

## 2021-09-15 DIAGNOSIS — L719 Rosacea, unspecified: Secondary | ICD-10-CM | POA: Diagnosis not present

## 2021-09-15 DIAGNOSIS — E119 Type 2 diabetes mellitus without complications: Secondary | ICD-10-CM | POA: Diagnosis not present

## 2021-09-15 DIAGNOSIS — Z5181 Encounter for therapeutic drug level monitoring: Secondary | ICD-10-CM | POA: Diagnosis not present

## 2021-09-15 LAB — POCT GLYCOSYLATED HEMOGLOBIN (HGB A1C): HbA1c, POC (controlled diabetic range): 6.7 % (ref 0.0–7.0)

## 2021-09-15 MED ORDER — LIDOCAINE 5 % EX PTCH: 1.0000 | MEDICATED_PATCH | CUTANEOUS | 0 refills | Status: DC

## 2021-09-15 NOTE — Assessment & Plan Note (Signed)
Persistent issues, no worsening and is currently stable.  Patient did not obtain the OTC lidocaine patches, will he request prescription at this time to see if it is cheaper. ?- Prescription lidocaine patches sent ?

## 2021-09-15 NOTE — Assessment & Plan Note (Signed)
Rosacea is still present.  Patient expresses dissatisfaction with his current dermatologist.  Patient to call the office with information for the office of the provider he is wishing to transition to and we will provide the referral at that time. ?

## 2021-09-15 NOTE — Progress Notes (Signed)
? ? ?  SUBJECTIVE:  ? ?CHIEF COMPLAINT / HPI:  ? ?Patient notes that he is "knots" on the back of his head/neck been present for the last 1 to 2 weeks.  He feels like they are almost like a boil and were initially very painful but have improved in their pain.  He thinks it is part of his skin condition that is present on his face as well.  He was previously seeing a dermatologist (Dr. Venetia Maxon) but would like to see another provider at this time.  He does have a sleep provider in mind but cannot remember the name or practice the provider is with. ? ?He also notes intermittent arthritic complaints of his right hip and lower back.  He was previously seen at the end of January in this office, he has tried the diclofenac gel without much improvement.  He has not yet gotten lidocaine patches.  He does he does note that he went back to physical therapy for 1 session but then decided to do things from home, he is wondering if he should go back into physical therapy. ? ?Patient was also initially wanting to learn how to check his sugars with a glucometer as he felt he needed to check 4 times a day as the box instructed. ? ?PERTINENT  PMH / PSH: Reviewed ? ?OBJECTIVE:  ? ?BP 123/86   Pulse 80   Ht '5\' 10"'$  (1.778 m)   Wt 256 lb (116.1 kg)   SpO2 96%   BMI 36.73 kg/m?   ?General: NAD, well-appearing, well-nourished ?Respiratory: No respiratory distress, breathing comfortably, able to speak in full sentences ?Skin: Erythematous and pustular lesions diffusely present on face and around scalp ?Psych: Appropriate affect and mood ? ?ASSESSMENT/PLAN:  ? ?Rosacea ?Rosacea is still present.  Patient expresses dissatisfaction with his current dermatologist.  Patient to call the office with information for the office of the provider he is wishing to transition to and we will provide the referral at that time. ? ?T2DM (type 2 diabetes mellitus) ([place]) ?A1c today 6.7%, not currently on medications and is well controlled. ?- Encouraged  healthy eating habits ?- Encouraged increased physical exercise/activity ?- A1c in 6 months ?- Counseled patient he does not need to check his blood sugars at this time. ? ?Chronic lumbar radiculopathy ?Persistent issues, no worsening and is currently stable.  Patient did not obtain the OTC lidocaine patches, will he request prescription at this time to see if it is cheaper. ?- Prescription lidocaine patches sent ?  ? ? ?Anthony Maggio, DO ?[facility]  ?

## 2021-09-15 NOTE — Assessment & Plan Note (Addendum)
A1c today 6.7%, not currently on medications and is well controlled. ?- Encouraged healthy eating habits ?- Encouraged increased physical exercise/activity ?- A1c in 6 months ?- Counseled patient he does not need to check his blood sugars at this time. ?

## 2021-09-15 NOTE — Patient Instructions (Addendum)
It was so great seeing you today! Today we discussed the following: ? ?-I placed a prescription for the lidocaine patches, if they are cheaper over-the-counter then you can get those (they will be the 4% lidocaine patches). ? ?-Call the physical therapy office and see if you need a new referral to me back up with them to help with your walking. ? ?-Your A1c today was 6.7%, which is great as your goal is (<7%).  I do not think you need to check your blood sugars at this time and we will spread out your checks to 6 months. ? ?-For weight loss assistance, you can contact [facility] and see if they will take your insurance. ? ?-For your dermatologist request, please just call our office with the name of the dermatologist you would like to be referred to. ? ? ?Please make sure to bring any medications you take to your appointments. If you have any questions or concerns please call the office at [phone].  ?

## 2021-09-16 ENCOUNTER — Telehealth (HOSPITAL_COMMUNITY): Payer: Self-pay | Admitting: *Deleted

## 2021-09-16 LAB — VALPROIC ACID LEVEL: Valproic Acid Lvl: 58 ug/mL (ref 50–100)

## 2021-09-16 NOTE — Telephone Encounter (Signed)
I reviewed his Depakote level.  His Depakote level dropped from 77 to now 58. Please call the patient and check his compliance.  If he is taking his medication as prescribed then he may need to take an extra Depakote to take Depakote 250 mg 5 times a day. ?

## 2021-09-16 NOTE — Telephone Encounter (Signed)
Valproic acid level received from [facility] resulted on 09/15/21. Valproic Acid @ 58 ug/ml. FYI. ?

## 2021-09-17 ENCOUNTER — Other Ambulatory Visit: Payer: Self-pay

## 2021-09-17 ENCOUNTER — Emergency Department (HOSPITAL_BASED_OUTPATIENT_CLINIC_OR_DEPARTMENT_OTHER)
Admission: EM | Admit: 2021-09-17 | Discharge: 2021-09-17 | Disposition: A | Discharge status: Home / Self Care | Payer: Medicare HMO | Attending: Emergency Medicine | Admitting: Emergency Medicine

## 2021-09-17 ENCOUNTER — Telehealth: Payer: Self-pay

## 2021-09-17 ENCOUNTER — Encounter (HOSPITAL_BASED_OUTPATIENT_CLINIC_OR_DEPARTMENT_OTHER): Payer: Self-pay

## 2021-09-17 ENCOUNTER — Telehealth (HOSPITAL_COMMUNITY): Payer: Self-pay | Admitting: *Deleted

## 2021-09-17 DIAGNOSIS — M542 Cervicalgia: Secondary | ICD-10-CM | POA: Diagnosis not present

## 2021-09-17 DIAGNOSIS — M436 Torticollis: Secondary | ICD-10-CM | POA: Insufficient documentation

## 2021-09-17 MED ORDER — DIAZEPAM 5 MG PO TABS
2.5000 mg | ORAL_TABLET | Freq: Once | ORAL | Status: AC
  Administered 2021-09-17: 2.5 mg via ORAL
  Filled 2021-09-17: qty 1

## 2021-09-17 MED ORDER — DIAZEPAM 5 MG PO TABS: 5.0000 mg | ORAL_TABLET | Freq: Four times a day (QID) | ORAL | 0 refills | Status: DC | PRN

## 2021-09-17 NOTE — ED Triage Notes (Signed)
Pt to ED by POV from home c/o nontraumatic neck pain and stiffness onset of 1 week ago. Denies any tingling or loss of sensation. Arrives A+O, VSS, NADN, ambulates with steady gait. ?

## 2021-09-17 NOTE — Telephone Encounter (Signed)
Writer was able to speak with pt after leaving VM earlier. Pt advised that Valproic Acid level has decreased and we wondered if he was taking medication as ordered. Pt states that he is consistent and compliant with dosage. Pt stated that he wasn't aware to hold Depakote prior to lab draw until writer advised. So he has concerns that a higher dose will be too sedating and also taking med 5 times daily is a concern.  Nurse advised that she will let provider know and will call him back to discuss tomorrow. Pt verbalizes understanding.  ?

## 2021-09-17 NOTE — Telephone Encounter (Signed)
Prior Auth for patients medication LIDOCAINE PATCHES denied by [employer] via CoverMyMeds.  ? ?Reason: must be used for post-herpetic neuralgia, pain associated with diabetic neuropathy, or pain associated with cancer-related neuropathy ? ?CoverMyMeds Key: [id number] ? ?Patient notified ? ?

## 2021-09-17 NOTE — ED Provider Notes (Signed)
?[facility] EMERGENC[facility] DEPARTMENT ?Provider Note ? ? ?CSN: [id number] ?Arrival date & time: 09/17/21  0229 ? ?  ? ?History ? ?Chief Complaint  ?Patient presents with  ? Neck Pain  ? ? ?[name] is a 61 y.o. male. ? ?61 yo M here with right sided neck pain and stiffness for the last 5-7 days without obvious onset. No fever. No recent illnesses. No sore throat. No trauma. Worse at night but mostly worse with putting his chin down and to the left. Has baseline tremor, no neuro changes. Hears crackling when he moves his head to the left.  ? ? ?Neck Pain ? ?  ? ?Home Medications ?Prior to Admission medications   ?Medication Sig Start Date End Date Taking? Authorizing Provider  ?diazepam (VALIUM) 5 MG tablet Take 1 tablet (5 mg total) by mouth every 6 (six) hours as needed (spasms). 09/17/21  Yes Gearl Baratta, Corene Cornea, MD  ?allopurinol (ZYLOPRIM) 300 MG tablet Take 1 tablet (300 mg total) by mouth daily. 01/10/21   Lilland, Alana, DO  ?benztropine (COGENTIN) 1 MG tablet Take 1 tablet (1 mg total) by mouth daily. 08/07/21   Arfeen, Arlyce Harman, MD  ?blood glucose meter kit and supplies KIT Dispense based on patient and insurance preference. Use up to four times daily as directed. 07/30/21   Lyndee Hensen, DO  ?DEPAKOTE ER 250 MG 24 hr tablet Take 2 tablets (500 mg total) by mouth 2 (two) times daily. 08/07/21   Arfeen, Arlyce Harman, MD  ?diclofenac Sodium (VOLTAREN) 1 % GEL Apply 4 g topically 4 (four) times daily. 07/30/21   Brimage, Ronnette Juniper, DO  ?ELIQUIS 5 MG TABS tablet TAKE 1 TABLET BY MOUTH TWICE A DAY 08/11/21   Lilland, Alana, DO  ?levothyroxine (SYNTHROID) 100 MCG tablet Take 1 tablet (100 mcg total) by mouth daily. 08/01/21   Lilland, Alana, DO  ?lidocaine (LIDODERM) 5 % Place 1 patch onto the skin daily. Remove & Discard patch within 12 hours or as directed by MD 09/15/21   Rise Patience, DO  ?perphenazine (TRILAFON) 8 MG tablet Take 3 tablets (24 mg total) by mouth at bedtime. 08/07/21   Arfeen, Arlyce Harman, MD  ?phenelzine (NARDIL)  15 MG tablet Take 3 tablets (45 mg total) by mouth 2 (two) times daily. 08/07/21   Arfeen, Arlyce Harman, MD  ?sildenafil (VIAGRA) 25 MG tablet Take 1 tablet (25 mg total) by mouth daily as needed for erectile dysfunction. 02/06/21   Rise Patience, DO  ?   ? ?Allergies    ?Lamictal [lamotrigine] and Metformin and related   ? ?Review of Systems   ?Review of Systems  ?Musculoskeletal:  Positive for neck pain.  ? ?Physical Exam ?Updated Vital Signs ?BP (!) 144/79 (BP Location: Right Arm)   Pulse 80   Temp 97.7 ?F (36.5 ?C)   Resp 16   Ht $R'5\' 10"'Qw$  (1.778 m)   Wt 116 kg   SpO2 93%   BMI 36.69 kg/m?  ?Physical Exam ?Vitals and nursing note reviewed.  ?Constitutional:   ?   Appearance: He is well-developed.  ?HENT:  ?   Head: Normocephalic and atraumatic.  ?   Mouth/Throat:  ?   Mouth: Mucous membranes are moist.  ?   Pharynx: Oropharynx is clear.  ?Eyes:  ?   Pupils: Pupils are equal, round, and reactive to light.  ?Cardiovascular:  ?   Rate and Rhythm: Normal rate.  ?Pulmonary:  ?   Effort: Pulmonary effort is normal. No respiratory distress.  ?  Abdominal:  ?   General: There is no distension.  ?Musculoskeletal:     ?   General: Tenderness (over right trapezius, also is spasmed) present. Normal range of motion.  ?   Cervical back: Normal range of motion.  ?Neurological:  ?   General: No focal deficit present.  ?   Mental Status: He is alert.  ? ? ?ED Results / Procedures / Treatments   ?Labs ?(all labs ordered are listed, but only abnormal results are displayed) ?Labs Reviewed - No data to display ? ?EKG ?None ? ?Radiology ?No results found. ? ?Procedures ?Procedures  ? ? ?Medications Ordered in ED ?Medications  ?diazepam (VALIUM) tablet 2.5 mg (2.5 mg Oral Given 09/17/21 0337)  ? ? ?ED Course/ Medical Decision Making/ A&P ?  ?                        ?Medical Decision Making ?Risk ?Prescription drug management. ? ? ?61 yo M here with likely torticollis. Doubt spinal/bony pathology with the presentation and minimal risk factors  (trauma, fever, cancer, IVDA, etc.), doubt pharyngeal causes.  ? ? ?Final Clinical Impression(s) / ED Diagnoses ?Final diagnoses:  ?Neck pain  ?Torticollis, acute  ? ? ?Rx / DC Orders ?ED Discharge Orders   ? ?      Ordered  ?  diazepam (VALIUM) 5 MG tablet  Every 6 hours PRN       ? 09/17/21 0330  ? ?  ?  ? ?  ? ? ?  ?Merrily Pew, MD ?09/17/21 [phone] ? ?

## 2021-09-17 NOTE — Telephone Encounter (Signed)
A Prior Authorization was initiated for this patients LIDOCAINE PATCHES through CoverMyMeds.  ? ?Key: [id number] ? ?

## 2021-09-18 NOTE — Telephone Encounter (Signed)
He can repeat the level. Please order repeat VPA and if still low than he can take extra Depakote at bedtime.  ?

## 2021-09-19 ENCOUNTER — Other Ambulatory Visit (HOSPITAL_COMMUNITY): Payer: Self-pay | Admitting: *Deleted

## 2021-09-19 DIAGNOSIS — Z5181 Encounter for therapeutic drug level monitoring: Secondary | ICD-10-CM

## 2021-09-26 ENCOUNTER — Other Ambulatory Visit (HOSPITAL_COMMUNITY): Payer: Self-pay | Admitting: Psychiatry

## 2021-09-26 ENCOUNTER — Other Ambulatory Visit: Payer: Self-pay | Admitting: Family Medicine

## 2021-09-26 DIAGNOSIS — M109 Gout, unspecified: Secondary | ICD-10-CM

## 2021-09-26 DIAGNOSIS — R251 Tremor, unspecified: Secondary | ICD-10-CM

## 2021-09-26 DIAGNOSIS — F339 Major depressive disorder, recurrent, unspecified: Secondary | ICD-10-CM

## 2021-10-13 ENCOUNTER — Other Ambulatory Visit (HOSPITAL_COMMUNITY): Payer: Self-pay | Admitting: Psychiatry

## 2021-10-13 ENCOUNTER — Telehealth: Payer: Self-pay

## 2021-10-13 DIAGNOSIS — R251 Tremor, unspecified: Secondary | ICD-10-CM

## 2021-10-13 DIAGNOSIS — F339 Major depressive disorder, recurrent, unspecified: Secondary | ICD-10-CM

## 2021-10-13 NOTE — Telephone Encounter (Signed)
Patient calls nurse line requesting a dermatology referral.  ? ?Patient reports he was seeing Dr. Danella Sensing at [facility], however unsatisfied with care.  ? ?Patient reports continued acne on his face.  ? ?Will forward to PCP to place referral as previous one was placed in 2020. ?

## 2021-10-15 ENCOUNTER — Other Ambulatory Visit (HOSPITAL_COMMUNITY): Payer: Self-pay | Admitting: Psychiatry

## 2021-10-15 ENCOUNTER — Other Ambulatory Visit: Payer: Self-pay | Admitting: Family Medicine

## 2021-10-15 ENCOUNTER — Other Ambulatory Visit (HOSPITAL_COMMUNITY): Payer: Self-pay | Admitting: *Deleted

## 2021-10-15 DIAGNOSIS — L719 Rosacea, unspecified: Secondary | ICD-10-CM

## 2021-10-15 DIAGNOSIS — F339 Major depressive disorder, recurrent, unspecified: Secondary | ICD-10-CM

## 2021-10-15 DIAGNOSIS — R251 Tremor, unspecified: Secondary | ICD-10-CM

## 2021-10-15 MED ORDER — BENZTROPINE MESYLATE 1 MG PO TABS: 1.0000 mg | ORAL_TABLET | Freq: Every day | ORAL | 0 refills | Status: DC

## 2021-10-30 ENCOUNTER — Other Ambulatory Visit (HOSPITAL_COMMUNITY): Payer: Self-pay | Admitting: Psychiatry

## 2021-10-30 DIAGNOSIS — F339 Major depressive disorder, recurrent, unspecified: Secondary | ICD-10-CM

## 2021-10-30 DIAGNOSIS — R251 Tremor, unspecified: Secondary | ICD-10-CM

## 2021-11-04 ENCOUNTER — Telehealth (HOSPITAL_COMMUNITY): Payer: Medicare HMO | Admitting: Psychiatry

## 2021-11-05 ENCOUNTER — Telehealth (HOSPITAL_BASED_OUTPATIENT_CLINIC_OR_DEPARTMENT_OTHER): Payer: Medicare HMO | Admitting: Psychiatry

## 2021-11-05 ENCOUNTER — Encounter (HOSPITAL_COMMUNITY): Payer: Self-pay | Admitting: Psychiatry

## 2021-11-05 ENCOUNTER — Telehealth (HOSPITAL_COMMUNITY): Payer: Self-pay | Admitting: *Deleted

## 2021-11-05 VITALS — Wt 258.0 lb

## 2021-11-05 DIAGNOSIS — R251 Tremor, unspecified: Secondary | ICD-10-CM

## 2021-11-05 DIAGNOSIS — F339 Major depressive disorder, recurrent, unspecified: Secondary | ICD-10-CM | POA: Diagnosis not present

## 2021-11-05 DIAGNOSIS — F419 Anxiety disorder, unspecified: Secondary | ICD-10-CM

## 2021-11-05 DIAGNOSIS — R69 Illness, unspecified: Secondary | ICD-10-CM | POA: Diagnosis not present

## 2021-11-05 MED ORDER — DIVALPROEX SODIUM 500 MG PO DR TAB: 500.0000 mg | DELAYED_RELEASE_TABLET | Freq: Two times a day (BID) | ORAL | 0 refills | Status: DC

## 2021-11-05 MED ORDER — BENZTROPINE MESYLATE 1 MG PO TABS: 1.0000 mg | ORAL_TABLET | Freq: Every day | ORAL | 0 refills | Status: DC

## 2021-11-05 MED ORDER — PERPHENAZINE 8 MG PO TABS: 24.0000 mg | ORAL_TABLET | Freq: Every day | ORAL | 0 refills | Status: DC

## 2021-11-05 MED ORDER — PHENELZINE SULFATE 15 MG PO TABS: 45.0000 mg | ORAL_TABLET | Freq: Two times a day (BID) | ORAL | 0 refills | Status: DC

## 2021-11-05 NOTE — Telephone Encounter (Signed)
Late entry: Pt presented to office yesterday to speak with this nurse. Pt also thought his appointment was yesterday, it's scheduled for today. Pt wanted you to know that since change in Depakote dose he feels his OCD is much worse as well as his tremors. Pt has not had repeat Depakote level. Yesterday he had already taken a dose of Depakote so could not have labs done then. FYI.   ?

## 2021-11-05 NOTE — Progress Notes (Signed)
Virtual Visit via Telephone Note ? ?I connected with [name] on 11/05/21 at  3:20 PM EDT by telephone and verified that I am speaking with the correct person using two identifiers. ? ?Location: ?Patient: Home ?Provider: Home Office ?  ?I discussed the limitations, risks, security and privacy concerns of performing an evaluation and management service by telephone and the availability of in person appointments. I also discussed with the patient that there may be a patient responsible charge related to this service. The patient expressed understanding and agreed to proceed. ? ? ?History of Present Illness: ?Patient is evaluated by phone session.  He had called multiple times after his last appointment about multiple issues.  He is concerned about his confusion, issues with Depakote, Tremors and concern that he had temporal lobe epilepsy.  His last Depakote level was less than 50 which was dropped from 77.  When asked about his compliance he told that he is taking Depakote 3 pills a day.  Patient was prescribed Depakote 250 mg 4 pills a day.  Patient is not sure why he is taking 3 a day.  He also not taking Cogentin regularly.  Recently he has a visit to urgent care for neck pain and given Valium but he has not taken it.  In March he has a visit to his PCP and his hemoglobin A1c is 7.  However he told that his blood sugar is normal and he does not need a new level anytime soon.  He is not consistent with his medication and not compliant with his healthy diet.  He goes on the weekend to [place] to visit his mother and admitted he had excessive sweets because his mother makes good food.  He has not have any confusion, forgetfulness but overall his memory, attention concentration is not good.  He usually call the office and does not remember the directions.  He was told to get another level after increasing the Depakote but he forgot the blood work.  Lived by himself.  He is concerned about tremors but not taking  Cogentin.  He feels his depression is a stable and he denies any paranoia, hallucination, suicidal thoughts but denies any panic attack.  He admitted his weight is increased from the past because he is not exercising.  He denies drinking or using any illegal substances. ? ?Past Psychiatric History: Reviewed. ?H/O multiple inpatient from [phone] for severe depression, suicidal thoughts. No h/o suicidal attempt. H/O OCD, schizoaffective disorder and major depression.  Tried numerous medication including TCAs.  We tried Lamictal but he develop a rash. ?  ?Recent Results (from the past 2160 hour(s))  ?HgB A1c     Status: None  ? Collection Time: 09/15/21 10:11 AM  ?Result Value Ref Range  ? Hemoglobin A1C    ? HbA1c POC (<> result, manual entry)    ? HbA1c, POC (prediabetic range)    ? HbA1c, POC (controlled diabetic range) 6.7 0.0 - 7.0 %  ?Valproic Acid level     Status: None  ? Collection Time: 09/15/21 11:36 AM  ?Result Value Ref Range  ? Valproic Acid Lvl 58 50 - 100 ug/mL  ?  Comment:                                 Detection Limit = 4 ?                           <  4 indicates None Detected ?Toxicity may occur at levels of 100-500. Measurements ?of free unbound valproic acid may improve the assess- ?ment of clinical response. ?  ?   ? ? ?Psychiatric Specialty Exam: ?Physical Exam  ?Review of Systems  ?Weight 258 lb (117 kg).There is no height or weight on file to calculate BMI.  ?General Appearance: NA  ?Eye Contact:  NA  ?Speech:  Slow  ?Volume:  Decreased  ?Mood:  Dysphoric  ?Affect:  NA  ?Thought Process:  Descriptions of Associations: Intact  ?Orientation:  Full (Time, Place, and Person)  ?Thought Content:  Rumination  ?Suicidal Thoughts:  No  ?Homicidal Thoughts:  No  ?Memory:  Immediate;   Fair ?Recent;   Fair ?Remote;   Fair  ?Judgement:  Fair  ?Insight:  Shallow  ?Psychomotor Activity:  Tremor  ?Concentration:  Concentration: Fair and Attention Span: Fair  ?Recall:  Fair  ?Fund of Knowledge:  Fair   ?Language:  Fair  ?Akathisia:  No  ?Handed:  Right  ?AIMS (if indicated):     ?Assets:  Communication Skills ?Housing ?Transportation  ?ADL's:  Intact  ?Cognition:  Impaired,  Mild  ?Sleep:   ok  ? ? ? ? ?Assessment and Plan: ?Major depressive disorder, recurrent.  Anxiety.  Tremor. ? ?I reviewed blood work results, current medication.  Patient is not taking Valium which was given in the ER for neck pain.  I explained that he needs to be consistent with his psychotropic medication.  Recommend to go back to Depakote 500 mg twice a day rather than 250 mg 2 pills twice a day because it would increase the compliance.  Reinforce to take Cogentin 1 mg to help his tremors.  Continue Nardil 45 mg daily twice a day and Trilafon 24 mg at bedtime.  We also talk about decreasing the Trilafon to help the tremors but patient very reluctant and does not want to change the medication because he has been stable on the combination of Trilafon and Nardil.  He is not interested in therapy.  We talk about regular exercise, watching his calorie intake and to schedule appointment with PCP for the management of diabetes.  Patient preferred to have a 90-day supply.  I recommend to call us back if is any question, concern if he feels worsening of the symptoms.  Follow up in 2 months. ? ?Follow Up Instructions: ? ?  ?I discussed the assessment and treatment plan with the patient. The patient was provided an opportunity to ask questions and all were answered. The patient agreed with the plan and demonstrated an understanding of the instructions. ?  ?The patient was advised to call back or seek an in-person evaluation if the symptoms worsen or if the condition fails to improve as anticipated. ? ?Collaboration of Care: Other provider involved in patient's care AEB notes are available in epic to review. ? ?Patient/Guardian was advised Release of Information must be obtained prior to any record release in order to collaborate their care with an  outside provider. Patient/Guardian was advised if they have not already done so to contact the registration department to sign all necessary forms in order for [place] to release information regarding their care.  ? ?Consent: Patient/Guardian gives verbal consent for treatment and assignment of benefits for services provided during this visit. Patient/Guardian expressed understanding and agreed to proceed.   ? ?I provided 35 minutes of non-face-to-face time during this encounter. ? ? ?Kathlee Nations, MD  ?

## 2021-11-17 ENCOUNTER — Ambulatory Visit (INDEPENDENT_AMBULATORY_CARE_PROVIDER_SITE_OTHER): Payer: Medicare HMO | Admitting: Family Medicine

## 2021-11-17 ENCOUNTER — Encounter: Payer: Self-pay | Admitting: Family Medicine

## 2021-11-17 VITALS — BP 122/89 | HR 81 | Ht 70.0 in | Wt 257.4 lb

## 2021-11-17 DIAGNOSIS — L719 Rosacea, unspecified: Secondary | ICD-10-CM | POA: Diagnosis not present

## 2021-11-17 DIAGNOSIS — R682 Dry mouth, unspecified: Secondary | ICD-10-CM

## 2021-11-17 MED ORDER — BIOTENE DRY MOUTH MT LIQD: 15.0000 mL | OROMUCOSAL | 0 refills | Status: DC | PRN

## 2021-11-17 NOTE — Progress Notes (Signed)
? ? ?  SUBJECTIVE:  ? ?CHIEF COMPLAINT / HPI:  ? ?Patient reports on Friday night he was having issues with swallowing and was not able to go to get his air out.  He notes that it has worsened when he has tried to take his atorvastatin medication.  He has a history of dry mouth due to TCA use and is always had problems with his swallowing.  He would like to try something to help with his dry mouth as able. ? ?Rosacea: has an appointment for July 5th with a dermatologist ? ?Per referral review sheet, patient was referred to neurology for temporal seizures.  They attempted to reach out but patient had not called them back for an appointment.  Patient does not remember getting any voicemails. ? ?PERTINENT  PMH / PSH: Reviewed ? ?OBJECTIVE:  ? ?BP 122/89   Pulse 81   Ht '5\' 10"'$  (1.778 m)   Wt 257 lb 6 oz (116.7 kg)   SpO2 96%   BMI 36.93 kg/m?   ?General: NAD, well-appearing, well-nourished ?HEENT: dry mouth noted, no oral lesions present ?Respiratory: No respiratory distress, breathing comfortably, able to speak in full sentences ?Skin: warm and dry, erythematous lesions noted on face consistent with rosacea ?Psych: Appropriate affect and mood ? ?ASSESSMENT/PLAN:  ? ?Rosacea ?- Patient already has an appointment with dermatology ? ?Dry mouth ?Dry mucous membranes, does not appear dehydrated.  Likely related to other medications, recommended good hydration and trial of Biotene oral rinse. ?- Biotene oral rinse ? ?Seizures ?- Information for [facility] given to patient for prior referral placed ? ?Ebrima Ranta, DO ?[place]  ?

## 2021-11-17 NOTE — Patient Instructions (Addendum)
For your dry mouth, I recommend getting the over-the-counter Biotene oral rinse.  This should not interact with any of your medications. ? ? ?Your next A1c will not be due for at least 1 month, but we do not really need to check it for another 4 months ? ?Below is the contact information for the neurologist office, please try to call them and see if you are able to schedule an appointment.: ?[facility] ?[address] ?[place], [place] [address] ?[phone] ?

## 2021-12-09 ENCOUNTER — Encounter: Payer: Self-pay | Admitting: *Deleted

## 2021-12-10 DIAGNOSIS — L218 Other seborrheic dermatitis: Secondary | ICD-10-CM | POA: Diagnosis not present

## 2021-12-10 DIAGNOSIS — L718 Other rosacea: Secondary | ICD-10-CM | POA: Diagnosis not present

## 2021-12-15 ENCOUNTER — Telehealth: Payer: Self-pay | Admitting: *Deleted

## 2021-12-15 ENCOUNTER — Other Ambulatory Visit: Payer: Self-pay | Admitting: Family Medicine

## 2021-12-15 DIAGNOSIS — R569 Unspecified convulsions: Secondary | ICD-10-CM

## 2021-12-15 NOTE — Telephone Encounter (Signed)
Patient called stating that he needs a new referral for neurology.  He was trying to see [facility].  He was referred in March but didn't schedule and now will need a new referral.  Tarrie Mcmichen,CMA

## 2021-12-21 ENCOUNTER — Other Ambulatory Visit: Payer: Self-pay

## 2021-12-21 ENCOUNTER — Emergency Department (HOSPITAL_BASED_OUTPATIENT_CLINIC_OR_DEPARTMENT_OTHER): Payer: Medicare HMO

## 2021-12-21 ENCOUNTER — Encounter (HOSPITAL_BASED_OUTPATIENT_CLINIC_OR_DEPARTMENT_OTHER): Payer: Self-pay

## 2021-12-21 ENCOUNTER — Emergency Department (HOSPITAL_BASED_OUTPATIENT_CLINIC_OR_DEPARTMENT_OTHER)
Admission: EM | Admit: 2021-12-21 | Discharge: 2021-12-21 | Disposition: A | Discharge status: Home / Self Care | Payer: Medicare HMO | Attending: Emergency Medicine | Admitting: Emergency Medicine

## 2021-12-21 DIAGNOSIS — W19XXXA Unspecified fall, initial encounter: Secondary | ICD-10-CM

## 2021-12-21 DIAGNOSIS — W108XXA Fall (on) (from) other stairs and steps, initial encounter: Secondary | ICD-10-CM | POA: Insufficient documentation

## 2021-12-21 DIAGNOSIS — M545 Low back pain, unspecified: Secondary | ICD-10-CM | POA: Diagnosis not present

## 2021-12-21 DIAGNOSIS — S199XXA Unspecified injury of neck, initial encounter: Secondary | ICD-10-CM | POA: Diagnosis not present

## 2021-12-21 DIAGNOSIS — S0990XA Unspecified injury of head, initial encounter: Secondary | ICD-10-CM | POA: Diagnosis not present

## 2021-12-21 DIAGNOSIS — Z7901 Long term (current) use of anticoagulants: Secondary | ICD-10-CM | POA: Insufficient documentation

## 2021-12-21 DIAGNOSIS — M79641 Pain in right hand: Secondary | ICD-10-CM | POA: Insufficient documentation

## 2021-12-21 DIAGNOSIS — M85841 Other specified disorders of bone density and structure, right hand: Secondary | ICD-10-CM | POA: Diagnosis not present

## 2021-12-21 DIAGNOSIS — Z043 Encounter for examination and observation following other accident: Secondary | ICD-10-CM | POA: Diagnosis not present

## 2021-12-21 NOTE — ED Triage Notes (Signed)
Pt to ED by POV from an adult entertainment establishment following a fall where he missed a step and landed on his right hand and hit his head, denies any LOC. Arrives A+O, VSS, NADN.

## 2021-12-21 NOTE — ED Provider Notes (Signed)
[facility] EMERGENCY DEPARTMENT Provider Note   CSN: [id number] Arrival date & time: 12/21/21  0155     History  Chief Complaint  Patient presents with   [name] is a 61 y.o. male.  The history is provided by the patient.  Fall This is a new problem. The current episode started less than 1 hour ago. The problem occurs constantly. The problem has been resolved. Pertinent negatives include no chest pain, no abdominal pain and no shortness of breath. Associated symptoms comments: Hit head and left hand after missing a step at [facility] . Nothing aggravates the symptoms. Nothing relieves the symptoms. He has tried nothing for the symptoms. The treatment provided no relief.       Home Medications Prior to Admission medications   Medication Sig Start Date End Date Taking? Authorizing Provider  allopurinol (ZYLOPRIM) 300 MG tablet TAKE 1 TABLET BY MOUTH EVERY DAY 09/29/21   Lilland, Alana, DO  antiseptic oral rinse (BIOTENE) LIQD 15 mLs by Mouth Rinse route as needed for dry mouth. 11/17/21   Lilland, Alana, DO  atorvastatin (LIPITOR) 40 MG tablet TAKE 1 TABLET BY MOUTH EVERY DAY 09/29/21   Lilland, Alana, DO  benztropine (COGENTIN) 1 MG tablet Take 1 tablet (1 mg total) by mouth daily. 11/05/21   Arfeen, Arlyce Harman, MD  blood glucose meter kit and supplies KIT Dispense based on patient and insurance preference. Use up to four times daily as directed. 07/30/21   Brimage, Ronnette Juniper, DO  diazepam (VALIUM) 5 MG tablet Take 1 tablet (5 mg total) by mouth every 6 (six) hours as needed (spasms). Patient not taking: Reported on 11/05/2021 09/17/21   Mesner, Corene Cornea, MD  diclofenac Sodium (VOLTAREN) 1 % GEL Apply 4 g topically 4 (four) times daily. 07/30/21   Brimage, Ronnette Juniper, DO  divalproex (DEPAKOTE) 500 MG DR tablet Take 1 tablet (500 mg total) by mouth 2 (two) times daily. 11/05/21 11/05/22  Arfeen, Arlyce Harman, MD  ELIQUIS 5 MG TABS tablet TAKE 1 TABLET BY MOUTH TWICE A DAY 08/11/21    Lilland, Alana, DO  levothyroxine (SYNTHROID) 100 MCG tablet Take 1 tablet (100 mcg total) by mouth daily. 08/01/21   Lilland, Alana, DO  lidocaine (LIDODERM) 5 % Place 1 patch onto the skin daily. Remove & Discard patch within 12 hours or as directed by MD 09/15/21   Lilland, Alana, DO  ONETOUCH VERIO test strip TEST UP TO 4 TIMES A DAY 09/29/21   Lilland, Alana, DO  perphenazine (TRILAFON) 8 MG tablet Take 3 tablets (24 mg total) by mouth at bedtime. 11/05/21   Arfeen, Arlyce Harman, MD  phenelzine (NARDIL) 15 MG tablet Take 3 tablets (45 mg total) by mouth 2 (two) times daily. 11/05/21   Arfeen, Arlyce Harman, MD  sildenafil (VIAGRA) 25 MG tablet Take 1 tablet (25 mg total) by mouth daily as needed for erectile dysfunction. 02/06/21   Lilland, Alana, DO      Allergies    Lamictal [lamotrigine] and Metformin and related    Review of Systems   Review of Systems  Constitutional:  Negative for fever.  HENT:  Negative for facial swelling.   Eyes:  Negative for redness.  Respiratory:  Negative for shortness of breath.   Cardiovascular:  Negative for chest pain.  Gastrointestinal:  Negative for abdominal pain.  Skin:  Negative for wound.  All other systems reviewed and are negative.   Physical Exam Updated Vital Signs BP (!) 133/95  Pulse 80   Temp 98 F (36.7 C) (Oral)   Resp 18   SpO2 96%  Physical Exam Vitals and nursing note reviewed.  Constitutional:      General: He is not in acute distress.    Appearance: Normal appearance. He is well-developed.  HENT:     Head: Normocephalic and atraumatic.     Nose: Nose normal.  Eyes:     Conjunctiva/sclera: Conjunctivae normal.     Pupils: Pupils are equal, round, and reactive to light.     Comments: Normal appearance  Cardiovascular:     Rate and Rhythm: Normal rate and regular rhythm.  Pulmonary:     Effort: Pulmonary effort is normal. No respiratory distress.     Breath sounds: Normal breath sounds.  Abdominal:     General: Abdomen is flat.  Bowel sounds are normal. There is no distension.     Palpations: Abdomen is soft. There is no mass.     Tenderness: There is no abdominal tenderness. There is no guarding or rebound.  Musculoskeletal:        General: Normal range of motion.     Right wrist: Normal. No bony tenderness or snuff box tenderness.     Right hand: Normal. Normal capillary refill.     Cervical back: Normal range of motion and neck supple. No tenderness.  Skin:    General: Skin is warm and dry.     Capillary Refill: Capillary refill takes less than 2 seconds.     Findings: No rash.  Neurological:     General: No focal deficit present.     Mental Status: He is alert and oriented to person, place, and time.     Deep Tendon Reflexes: Reflexes normal.  Psychiatric:        Mood and Affect: Mood normal.        Behavior: Behavior normal.     ED Results / Procedures / Treatments   Labs (all labs ordered are listed, but only abnormal results are displayed) Labs Reviewed - No data to display  EKG None  Radiology CT Head Wo Contrast  Result Date: 12/21/2021 CLINICAL DATA:  Trauma. EXAM: CT HEAD WITHOUT CONTRAST CT CERVICAL SPINE WITHOUT CONTRAST TECHNIQUE: Multidetector CT imaging of the head and cervical spine was performed following the standard protocol without intravenous contrast. Multiplanar CT image reconstructions of the cervical spine were also generated. RADIATION DOSE REDUCTION: This exam was performed according to the departmental dose-optimization program which includes automated exposure control, adjustment of the mA and/or kV according to patient size and/or use of iterative reconstruction technique. COMPARISON:  Head CT dated 03/24/2020. FINDINGS: CT HEAD FINDINGS Brain: Moderate age-related atrophy and chronic microvascular ischemic changes. There is no acute intracranial hemorrhage. No mass effect or midline shift. No extra-axial fluid collection. Vascular: No hyperdense vessel or unexpected  calcification. Skull: Normal. Negative for fracture or focal lesion. Sinuses/Orbits: No acute finding. Other: None CT CERVICAL SPINE FINDINGS Alignment: No acute subluxation. Skull base and vertebrae: No acute fracture.  Osteopenia. Soft tissues and spinal canal: No prevertebral fluid or swelling. No visible canal hematoma. Disc levels: Multilevel degenerative changes. There is diffuse anterior bridging osteophyte consistent with DISH. Upper chest: Negative. Other: None IMPRESSION: 1. No acute intracranial pathology. Moderate age-related atrophy and chronic microvascular ischemic changes. 2. No acute cervical spine fracture or subluxation. Electronically Signed   By: Anner Crete M.D.   On: 12/21/2021 03:23   CT Cervical Spine Wo Contrast  Result Date: 12/21/2021 CLINICAL  DATA:  Trauma. EXAM: CT HEAD WITHOUT CONTRAST CT CERVICAL SPINE WITHOUT CONTRAST TECHNIQUE: Multidetector CT imaging of the head and cervical spine was performed following the standard protocol without intravenous contrast. Multiplanar CT image reconstructions of the cervical spine were also generated. RADIATION DOSE REDUCTION: This exam was performed according to the departmental dose-optimization program which includes automated exposure control, adjustment of the mA and/or kV according to patient size and/or use of iterative reconstruction technique. COMPARISON:  Head CT dated 03/24/2020. FINDINGS: CT HEAD FINDINGS Brain: Moderate age-related atrophy and chronic microvascular ischemic changes. There is no acute intracranial hemorrhage. No mass effect or midline shift. No extra-axial fluid collection. Vascular: No hyperdense vessel or unexpected calcification. Skull: Normal. Negative for fracture or focal lesion. Sinuses/Orbits: No acute finding. Other: None CT CERVICAL SPINE FINDINGS Alignment: No acute subluxation. Skull base and vertebrae: No acute fracture.  Osteopenia. Soft tissues and spinal canal: No prevertebral fluid or swelling.  No visible canal hematoma. Disc levels: Multilevel degenerative changes. There is diffuse anterior bridging osteophyte consistent with DISH. Upper chest: Negative. Other: None IMPRESSION: 1. No acute intracranial pathology. Moderate age-related atrophy and chronic microvascular ischemic changes. 2. No acute cervical spine fracture or subluxation. Electronically Signed   By: Anner Crete M.D.   On: 12/21/2021 03:23   DG Hand Complete Right  Result Date: 12/21/2021 CLINICAL DATA:  Fall. EXAM: RIGHT HAND - COMPLETE 3+ VIEW; RIGHT WRIST - COMPLETE 3+ VIEW COMPARISON:  None Available. FINDINGS: There is no acute fracture or dislocation. The bones are osteopenic. Old fracture of the ulnar styloid with nonunion. The soft tissues are unremarkable. IMPRESSION: 1. No acute fracture or dislocation. 2. Osteopenia. Electronically Signed   By: Anner Crete M.D.   On: 12/21/2021 02:59   DG Wrist Complete Right  Result Date: 12/21/2021 CLINICAL DATA:  Fall. EXAM: RIGHT HAND - COMPLETE 3+ VIEW; RIGHT WRIST - COMPLETE 3+ VIEW COMPARISON:  None Available. FINDINGS: There is no acute fracture or dislocation. The bones are osteopenic. Old fracture of the ulnar styloid with nonunion. The soft tissues are unremarkable. IMPRESSION: 1. No acute fracture or dislocation. 2. Osteopenia. Electronically Signed   By: Anner Crete M.D.   On: 12/21/2021 02:59    Procedures Procedures    Medications Ordered in ED Medications - No data to display  ED Course/ Medical Decision Making/ A&P                           Medical Decision Making [address] and has right hand pain.  Hit head. No LOC.   Amount and/or Complexity of Data Reviewed External Data Reviewed: notes.    Details: previous notes reviewed Radiology: ordered and independent interpretation performed.    Details: Normal CT head and no fracture of Cspine by me.  No hand fracture by me  Risk Risk Details: No acute injuries from fall.  Alternate tylenol and  ibuprofen.  Stable for discharge with close follow up.     Final Clinical Impression(s) / ED Diagnoses Final diagnoses:  Fall, initial encounter   Return for intractable cough, coughing up blood, fevers > 100.4 unrelieved by medication, shortness of breath, intractable vomiting, chest pain, shortness of breath, weakness, numbness, changes in speech, facial asymmetry, abdominal pain, passing out, Inability to tolerate liquids or food, cough, altered mental status or any concerns. No signs of systemic illness or infection. The patient is nontoxic-appearing on exam and vital signs are within normal limits.  I have reviewed  the triage vital signs and the nursing notes. Pertinent labs & imaging results that were available during my care of the patient were reviewed by me and considered in my medical decision making (see chart for details). After history, exam, and medical workup I feel the patient has been appropriately medically screened and is safe for discharge home. Pertinent diagnoses were discussed with the patient. Patient was given return precautions Rx / DC Orders ED Discharge Orders     None         Lynnlee Revels, MD 12/21/21 [id number]

## 2021-12-23 ENCOUNTER — Encounter: Payer: Self-pay | Admitting: *Deleted

## 2021-12-28 ENCOUNTER — Other Ambulatory Visit (HOSPITAL_COMMUNITY): Payer: Self-pay | Admitting: Psychiatry

## 2021-12-28 DIAGNOSIS — R251 Tremor, unspecified: Secondary | ICD-10-CM

## 2021-12-28 DIAGNOSIS — F339 Major depressive disorder, recurrent, unspecified: Secondary | ICD-10-CM

## 2021-12-28 DIAGNOSIS — F419 Anxiety disorder, unspecified: Secondary | ICD-10-CM

## 2022-01-05 ENCOUNTER — Telehealth (HOSPITAL_COMMUNITY): Payer: Medicare HMO | Admitting: Psychiatry

## 2022-01-13 DIAGNOSIS — L718 Other rosacea: Secondary | ICD-10-CM | POA: Diagnosis not present

## 2022-01-20 ENCOUNTER — Telehealth (HOSPITAL_COMMUNITY): Payer: Self-pay | Admitting: *Deleted

## 2022-01-20 ENCOUNTER — Encounter (HOSPITAL_COMMUNITY): Payer: Self-pay | Admitting: Psychiatry

## 2022-01-20 ENCOUNTER — Telehealth (HOSPITAL_BASED_OUTPATIENT_CLINIC_OR_DEPARTMENT_OTHER): Payer: Medicare HMO | Admitting: Psychiatry

## 2022-01-20 ENCOUNTER — Telehealth (HOSPITAL_COMMUNITY): Payer: Self-pay | Admitting: Psychiatry

## 2022-01-20 ENCOUNTER — Other Ambulatory Visit (HOSPITAL_COMMUNITY): Payer: Self-pay | Admitting: *Deleted

## 2022-01-20 DIAGNOSIS — F419 Anxiety disorder, unspecified: Secondary | ICD-10-CM

## 2022-01-20 DIAGNOSIS — F339 Major depressive disorder, recurrent, unspecified: Secondary | ICD-10-CM | POA: Diagnosis not present

## 2022-01-20 DIAGNOSIS — R251 Tremor, unspecified: Secondary | ICD-10-CM | POA: Diagnosis not present

## 2022-01-20 DIAGNOSIS — Z5181 Encounter for therapeutic drug level monitoring: Secondary | ICD-10-CM

## 2022-01-20 DIAGNOSIS — R69 Illness, unspecified: Secondary | ICD-10-CM | POA: Diagnosis not present

## 2022-01-20 MED ORDER — DIVALPROEX SODIUM 500 MG PO DR TAB: 500.0000 mg | DELAYED_RELEASE_TABLET | Freq: Two times a day (BID) | ORAL | 0 refills | Status: DC

## 2022-01-20 MED ORDER — PERPHENAZINE 8 MG PO TABS: 24.0000 mg | ORAL_TABLET | Freq: Every day | ORAL | 0 refills | Status: DC

## 2022-01-20 MED ORDER — BENZTROPINE MESYLATE 1 MG PO TABS: 1.0000 mg | ORAL_TABLET | Freq: Every day | ORAL | 0 refills | Status: DC

## 2022-01-20 MED ORDER — PHENELZINE SULFATE 15 MG PO TABS: 45.0000 mg | ORAL_TABLET | Freq: Two times a day (BID) | ORAL | 0 refills | Status: DC

## 2022-01-20 NOTE — Progress Notes (Signed)
Virtual Visit via Telephone Note  I connected with [name] on 01/20/22 at  8:20 AM EDT by telephone and verified that I am speaking with the correct person using two identifiers.  Location: Patient: Home Provider: Home Office   I discussed the limitations, risks, security and privacy concerns of performing an evaluation and management service by telephone and the availability of in person appointments. I also discussed with the patient that there may be a patient responsible charge related to this service. The patient expressed understanding and agreed to proceed.   History of Present Illness: Patient is evaluated by phone session.  He is sleeping better and denies any recent depressive thoughts or panic attack.  Since taking the medication on time and regularly he noticed improvement in his mood.  However he is concerned about his chronic memory issues with some time he gets forgetful and have difficulty remembering things.  During the session he keep asking the same question multiple times and gets sometimes confused.  He sleeps good.  He denies any irritability, anger, mania or any psychosis.  He denies any feeling of hopelessness or suicidal thoughts.  Lately his mother is staying with him.  He had a fall last month when he was at nightclub and he tripped after missing the steps.  He is feeling better.  He has tremors but since taking the Cogentin it is helping him a lot.  We have requested a Depakote level however he has not done it but promised to have it done soon because he has upcoming blood work for his blood sugar.  He lives by himself.  He reported his weight is unchanged from the past.  He admitted not exercising but does walk when the weather is good.  He denies drinking or using any illegal substances.  Past Psychiatric History: Reviewed. H/O multiple inpatient from [phone] for severe depression, suicidal thoughts. No h/o suicidal attempt. H/O OCD, schizoaffective disorder and  major depression.  Tried numerous medication including TCAs.  We tried Lamictal but he develop a rash.  Psychiatric Specialty Exam: Physical Exam  Review of Systems  Weight 257 lb (116.6 kg).There is no height or weight on file to calculate BMI.  General Appearance: NA  Eye Contact:  NA  Speech:  Slow  Volume:  Decreased  Mood:  Anxious  Affect:  NA  Thought Process:  Descriptions of Associations: Intact  Orientation:  Full (Time, Place, and Person)  Thought Content:  Rumination  Suicidal Thoughts:  No  Homicidal Thoughts:  No  Memory:  Immediate;   Fair Recent;   Fair Remote;   Fair  Judgement:  Fair  Insight:  Shallow  Psychomotor Activity:  NA  Concentration:  Concentration: Fair and Attention Span: Fair  Recall:  [employer] of Knowledge:  Fair  Language:  Fair  Akathisia:  No  Handed:  Right  AIMS (if indicated):     Assets:  Communication Skills Desire for Improvement Housing Transportation  ADL's:  Intact  Cognition:  Impaired,  Mild  Sleep:   better      Assessment and Plan: Major depressive disorder, recurrent.  Anxiety.  Tremors.  I reviewed notes from recent emergency room visit because of the fall.  No new medication added.  Reinforce to take the medication as prescribed since he is doing better when he takes the medicine regularly.  I discussed about his chronic memory issues and recommended to see neurology.  We have referred him in the past but  he did not remember and has missed appointment.  We will again refer him to [facility].  We will do Depakote level and I encourage to see his physician for blood work for blood sugar.  His last hemoglobin A1c was 7.  His tremors are stable.  He does not want to change the medication since he feels better than past.  We will continue Nardil 45 mg twice a day, Trilafon 24 mg at bedtime, Cogentin 1 mg tremors and Depakote 500 mg 2 times a day.  I discussed medication side effects and possible memory issues due to  polypharmacy however he is reluctant to cut down his medication at this time.  He is not interested in therapy.  Follow-up in 3 months.  Follow Up Instructions:    I discussed the assessment and treatment plan with the patient. The patient was provided an opportunity to ask questions and all were answered. The patient agreed with the plan and demonstrated an understanding of the instructions.   The patient was advised to call back or seek an in-person evaluation if the symptoms worsen or if the condition fails to improve as anticipated.  Collaboration of Care: Other provider involved in patient's care AEB notes are in epic to review.   Patient/Guardian was advised Release of Information must be obtained prior to any record release in order to collaborate their care with an outside provider. Patient/Guardian was advised if they have not already done so to contact the registration department to sign all necessary forms in order for [place] to release information regarding their care.   Consent: Patient/Guardian gives verbal consent for treatment and assignment of benefits for services provided during this visit. Patient/Guardian expressed understanding and agreed to proceed.    I provided 28 minutes of non-face-to-face time during this encounter.   Kathlee Nations, MD

## 2022-01-20 NOTE — Telephone Encounter (Signed)
Ok to do Dentist.

## 2022-01-20 NOTE — Telephone Encounter (Signed)
Left voicemail requesting call back to schedule appointment.

## 2022-01-20 NOTE — Telephone Encounter (Signed)
Writer spoke with pt and his mother, Hinton Dyer, regarding script for Depakote 500 mg DR that was sent after visit today. Pt questioning why it is DR rather than ER. Writer explained they are both extended release medication however pt wants to know if it's ok to take the DR, he is very anxious about it. Mother also request a jury duty excuse which they will pick up this afternoon. Please review.

## 2022-01-20 NOTE — Telephone Encounter (Signed)
[name] and mother cam to office to pick up jury excuse letter. Writer, [name] ,and mother discussed again that referral to [facility] is till active. Mother will need to call [facility] to schedule an appointment. Phone number has been provided. Also discussed the Depakote being in DR as opposed to his usual ER. Med ed reinforced. [name] and mother verbalize understanding. [name] reminded to hold  morning dose of the Depakote prior to blood draw.

## 2022-01-21 DIAGNOSIS — Z5181 Encounter for therapeutic drug level monitoring: Secondary | ICD-10-CM | POA: Diagnosis not present

## 2022-01-22 ENCOUNTER — Telehealth (HOSPITAL_COMMUNITY): Payer: Self-pay | Admitting: *Deleted

## 2022-01-22 LAB — VALPROIC ACID LEVEL: Valproic Acid Lvl: 48 ug/mL — ABNORMAL LOW (ref 50–100)

## 2022-01-22 NOTE — Telephone Encounter (Signed)
Received lab results from [facility] this morning. Resulted 01/21/22.   Valproic Acid level @ 48 ug/ml.

## 2022-01-23 ENCOUNTER — Telehealth (HOSPITAL_COMMUNITY): Payer: Self-pay | Admitting: Psychiatry

## 2022-02-03 DIAGNOSIS — L309 Dermatitis, unspecified: Secondary | ICD-10-CM | POA: Diagnosis not present

## 2022-02-06 ENCOUNTER — Ambulatory Visit (INDEPENDENT_AMBULATORY_CARE_PROVIDER_SITE_OTHER): Payer: Medicare HMO | Admitting: Family Medicine

## 2022-02-06 ENCOUNTER — Encounter: Payer: Self-pay | Admitting: Family Medicine

## 2022-02-06 VITALS — BP 137/82 | HR 72 | Ht 70.0 in | Wt 257.2 lb

## 2022-02-06 DIAGNOSIS — M160 Bilateral primary osteoarthritis of hip: Secondary | ICD-10-CM | POA: Diagnosis not present

## 2022-02-06 DIAGNOSIS — W19XXXA Unspecified fall, initial encounter: Secondary | ICD-10-CM | POA: Diagnosis not present

## 2022-02-06 DIAGNOSIS — Y92009 Unspecified place in unspecified non-institutional (private) residence as the place of occurrence of the external cause: Secondary | ICD-10-CM

## 2022-02-06 NOTE — Patient Instructions (Addendum)
You have been referred several times already to neurology, you must prove that you can follow-up with your requests for referrals before I place any other ones.    Please contact [facility] at [phone]. Please call them on Monday morning when they open around 7am.  Please make sure you have the password and access to your MyChart account. Several people have sent you messages regarding your care and communications.   Please go to [address] to get x-rays of your hips today. The location is called [facility] and you can walk in and get the imaging done.   You can use your heating pad for the back and hip pain. Follow-up in the next few weeks if you are having worsening pain.

## 2022-02-06 NOTE — Progress Notes (Signed)
    SUBJECTIVE:   CHIEF COMPLAINT / HPI:   Dermatologist  - Started on medication for rosacea, patient is unsure of the name - Has followed up with dermatology in the last week  Hip pain - Last fall was 2 weeks ago  - Patient having pain in thigh when putting on socks - Is wanting to have physical therapist again Anderson Malta Cochrane at [place]), but did not respond to previous attempts to get a hold of him.  PERTINENT  PMH / PSH: Reviewed   OBJECTIVE:   BP 137/82   Pulse 72   Ht '5\' 10"'$  (1.778 m)   Wt 257 lb 4 oz (116.7 kg)   SpO2 95%   BMI 36.91 kg/m   General: NAD, well-appearing, well-nourished Respiratory: No respiratory distress, breathing comfortably, able to speak in full sentences Skin: significant erythematous papules on face  Psych: Appropriate affect and mood MSK: right lower back TTP in the sacral/low lumbar region.  Full ROM of bilateral hips, mildly decreased fever of the right hip with pain in the lower back upon stretching.  Negative straight leg raise.  Abnormal gait with slightly shuffling pattern in difficulty with balance when getting up to sit on table.  ASSESSMENT/PLAN:   Recurrent falls   Low back pain Has been an issue for quite some time now.  Does have some known history of arthritis.  No red flag signs on examination or during history.  Given the reassuring physical exam, will evaluate with x-rays of the lumbar spine and bilateral hips with pelvis.  Patient is interested in Ortho referral if deemed appropriate. - DG bilateral hips with pelvis - DG lumbar spine complete - Heating pad and OTC medications as tolerated - Physical therapy for balance improvement  Temporal lobe seizures  Has been previously noted, patient has had several referrals to neurology but has not followed up appropriately.  Firmly discussed with patient that he needs to either be available on MyChart or via phone for people to get in contact with him.  Neurology referral was  placed by psychiatrist at recent visit. - Gave patient [facility] contact information - Patient to call on Monday when they open at 7 AM - Holding off on further referrals until patient proves he can follow-up with specialist    Rise Patience, [place]

## 2022-02-09 ENCOUNTER — Ambulatory Visit
Admission: RE | Admit: 2022-02-09 | Discharge: 2022-02-09 | Disposition: A | Discharge status: Home / Self Care | Payer: Medicare HMO | Source: Ambulatory Visit | Attending: Family Medicine | Admitting: Family Medicine

## 2022-02-09 DIAGNOSIS — M545 Low back pain, unspecified: Secondary | ICD-10-CM | POA: Diagnosis not present

## 2022-02-09 DIAGNOSIS — M25551 Pain in right hip: Secondary | ICD-10-CM | POA: Diagnosis not present

## 2022-02-09 DIAGNOSIS — M25552 Pain in left hip: Secondary | ICD-10-CM | POA: Diagnosis not present

## 2022-02-09 DIAGNOSIS — Y92009 Unspecified place in unspecified non-institutional (private) residence as the place of occurrence of the external cause: Secondary | ICD-10-CM

## 2022-02-09 DIAGNOSIS — M160 Bilateral primary osteoarthritis of hip: Secondary | ICD-10-CM

## 2022-02-13 DIAGNOSIS — L718 Other rosacea: Secondary | ICD-10-CM | POA: Diagnosis not present

## 2022-02-13 DIAGNOSIS — Z008 Encounter for other general examination: Secondary | ICD-10-CM | POA: Diagnosis not present

## 2022-02-13 DIAGNOSIS — G40909 Epilepsy, unspecified, not intractable, without status epilepticus: Secondary | ICD-10-CM | POA: Diagnosis not present

## 2022-02-13 DIAGNOSIS — E039 Hypothyroidism, unspecified: Secondary | ICD-10-CM | POA: Diagnosis not present

## 2022-02-13 DIAGNOSIS — E785 Hyperlipidemia, unspecified: Secondary | ICD-10-CM | POA: Diagnosis not present

## 2022-02-13 DIAGNOSIS — I4891 Unspecified atrial fibrillation: Secondary | ICD-10-CM | POA: Diagnosis not present

## 2022-02-13 DIAGNOSIS — R69 Illness, unspecified: Secondary | ICD-10-CM | POA: Diagnosis not present

## 2022-02-13 DIAGNOSIS — E1159 Type 2 diabetes mellitus with other circulatory complications: Secondary | ICD-10-CM | POA: Diagnosis not present

## 2022-02-13 DIAGNOSIS — I2510 Atherosclerotic heart disease of native coronary artery without angina pectoris: Secondary | ICD-10-CM | POA: Diagnosis not present

## 2022-02-13 DIAGNOSIS — G2570 Drug induced movement disorder, unspecified: Secondary | ICD-10-CM | POA: Diagnosis not present

## 2022-02-13 DIAGNOSIS — D6869 Other thrombophilia: Secondary | ICD-10-CM | POA: Diagnosis not present

## 2022-02-23 DIAGNOSIS — L738 Other specified follicular disorders: Secondary | ICD-10-CM | POA: Diagnosis not present

## 2022-02-23 DIAGNOSIS — D485 Neoplasm of uncertain behavior of skin: Secondary | ICD-10-CM | POA: Diagnosis not present

## 2022-02-25 DIAGNOSIS — L239 Allergic contact dermatitis, unspecified cause: Secondary | ICD-10-CM | POA: Diagnosis not present

## 2022-02-25 NOTE — Telephone Encounter (Signed)
My Chart message sent

## 2022-02-27 ENCOUNTER — Telehealth: Payer: Self-pay

## 2022-02-27 NOTE — Telephone Encounter (Signed)
Patient calls nurse line reporting continued pain post fall ~ 3 weeks ago.   Patient was seen at [facility] and went for imaging. Imaging WNL for fractures.   Patient reports he has not taken anything for pain. Patient advised he can use OTC medications and heating pad to relieve symptoms.  Patient requests to be seen in office.   Patient scheduled for 8/28.

## 2022-03-02 ENCOUNTER — Ambulatory Visit (INDEPENDENT_AMBULATORY_CARE_PROVIDER_SITE_OTHER): Payer: Medicare HMO | Admitting: Student

## 2022-03-02 VITALS — BP 128/72 | HR 63 | Ht 70.0 in | Wt 257.0 lb

## 2022-03-02 DIAGNOSIS — M160 Bilateral primary osteoarthritis of hip: Secondary | ICD-10-CM | POA: Diagnosis not present

## 2022-03-02 DIAGNOSIS — L0202 Furuncle of face: Secondary | ICD-10-CM | POA: Diagnosis not present

## 2022-03-02 DIAGNOSIS — L700 Acne vulgaris: Secondary | ICD-10-CM | POA: Diagnosis not present

## 2022-03-02 MED ORDER — LIDOCAINE 5 % EX PTCH: 1.0000 | MEDICATED_PATCH | CUTANEOUS | 0 refills | Status: DC

## 2022-03-02 NOTE — Patient Instructions (Addendum)
It was great to see you! Thank you for allowing me to participate in your care!   Our plans for today:  - I have re-prescribed lidocaine patches - Please take tylenol every 6 hours as needed - I have included PT number to this   Take care and seek immediate care sooner if you develop any concerns.  Gerrit Heck, MD

## 2022-03-02 NOTE — Assessment & Plan Note (Addendum)
We discussed that PT is recommended and patient did not answer phone call from them so I have provided their number to him.  No red flag symptoms at this time such as saddle anesthesia.  Last imaging has not shown any fractures.  History of fall 4 weeks ago.  Most likely mechanical in nature given history and less likely syncope. -Tylenol every 6 hours as needed -Lidocaine patch as needed -PT number given to patient

## 2022-03-02 NOTE — Progress Notes (Signed)
    SUBJECTIVE:   CHIEF COMPLAINT / HPI:   Hip/Back Pain Had a fall last month.  He says that he did not realize there was an additional step on the last part of his stair case and fell down after that and did hit his head at that point.  He has had head imaging that was negative.  He is also had x-rays of his lumbar spine and hips as seen below.  DG Lumbar Spine 1. No acute fracture or dislocation identified. 2. Osteoarthritic changes of spine.  DG HIPS BILAT WITH PELVIS 3-4 VIEWS  No acute fracture or dislocation. Degenerative joint changes of bilateral hips.  He says that he still has hip/lower back pain.  He says that his hip pain is bilateral.  There is some tenderness to palpation on the right side.  He denies any sensation differences or shocklike sensations.  No perineal sensation loss.  No incontinence.   PERTINENT  PMH / PSH: PAF on eliquis, hx of falls  OBJECTIVE:   BP 128/72   Pulse 63   Ht '5\' 10"'$  (1.778 m)   Wt 257 lb (116.6 kg)   SpO2 94%   BMI 36.88 kg/m   General: NAD, awake, alert, responsive to questions Head: Normocephalic atraumatic, acne present CV: irregular irregular rate and rhythm  Respiratory: Clear to ausculation bilaterally, no increased work of breathing Extremities: Shuffling gait, able to sit up on the table.  Some pain on elevation of legs.  Good sensation and perfusion of lower extremities.  No pain with flexion or extension of hips bilaterally.  Some tenderness to palpation of right hip.  Some tenderness to palpation of lumbar area  ASSESSMENT/PLAN:   Bilateral primary osteoarthritis of hip We discussed that PT is recommended and patient did not answer phone call from them so I have provided their number to him.  No red flag symptoms at this time such as saddle anesthesia.  Last imaging has not shown any fractures.  History of fall 4 weeks ago.  Most likely mechanical in nature given history and less likely syncope. -Tylenol every 6 hours as  needed -Lidocaine patch as needed -PT number given to patient   Gerrit Heck, MD [place]

## 2022-03-03 ENCOUNTER — Telehealth: Payer: Self-pay

## 2022-03-03 NOTE — Telephone Encounter (Signed)
A Prior Authorization was initiated for this patients LIDOCAINE PATCHES through CoverMyMeds.   Key: [id number]

## 2022-03-04 ENCOUNTER — Encounter: Payer: Self-pay | Admitting: Diagnostic Neuroimaging

## 2022-03-04 ENCOUNTER — Other Ambulatory Visit: Payer: Self-pay

## 2022-03-04 ENCOUNTER — Ambulatory Visit: Payer: Medicare Other | Admitting: Diagnostic Neuroimaging

## 2022-03-04 ENCOUNTER — Ambulatory Visit: Payer: Medicare HMO | Attending: Family Medicine

## 2022-03-04 DIAGNOSIS — M160 Bilateral primary osteoarthritis of hip: Secondary | ICD-10-CM | POA: Diagnosis not present

## 2022-03-04 DIAGNOSIS — M5459 Other low back pain: Secondary | ICD-10-CM | POA: Diagnosis not present

## 2022-03-04 DIAGNOSIS — M25552 Pain in left hip: Secondary | ICD-10-CM | POA: Insufficient documentation

## 2022-03-04 DIAGNOSIS — M25551 Pain in right hip: Secondary | ICD-10-CM | POA: Insufficient documentation

## 2022-03-04 DIAGNOSIS — R2689 Other abnormalities of gait and mobility: Secondary | ICD-10-CM | POA: Diagnosis not present

## 2022-03-04 DIAGNOSIS — Y92009 Unspecified place in unspecified non-institutional (private) residence as the place of occurrence of the external cause: Secondary | ICD-10-CM | POA: Diagnosis not present

## 2022-03-04 DIAGNOSIS — W19XXXA Unspecified fall, initial encounter: Secondary | ICD-10-CM | POA: Insufficient documentation

## 2022-03-04 DIAGNOSIS — M6281 Muscle weakness (generalized): Secondary | ICD-10-CM | POA: Insufficient documentation

## 2022-03-04 NOTE — Therapy (Signed)
OUTPATIENT PHYSICAL THERAPY THORACOLUMBAR EVALUATION   Patient Name: [name] MRN: [id number] DOB:Sep 17, 1960, 61 y.o., male Today's Date: 03/04/2022   PT End of Session - 03/04/22 1454     Visit Number 1    Date for PT Re-Evaluation 04/29/22    Authorization Type [employer] Medicare    Progress Note Due on Visit 10    PT Start Time 1402    PT Stop Time 1440    PT Time Calculation (min) 38 min    Activity Tolerance Patient tolerated treatment well    Behavior During Therapy WFL for tasks assessed/performed             Past Medical History:  Diagnosis Date   A-fib ([place])    Acute right-sided low back pain    Arrhythmia    Chronic a-fib ([facility])    Closed fracture of left distal radius    Contact dermatitis 04/16/2017   Depression    Depression    Hypothyroidism    Low back pain    Morbid obesity, BMI unknown ([place])    Osteoarthritis    oa in bilateral knees   Schizophrenia ([place])    Seizures ([place])    Thyroid disease    Varicose veins    Past Surgical History:  Procedure Laterality Date   ACHILLES TENDON REPAIR  approx. 2004   right foot   CATARACT EXTRACTION     bilateral   OPEN REDUCTION INTERNAL FIXATION (ORIF) DISTAL RADIAL FRACTURE Left 07/21/2018   Procedure: OPEN REDUCTION INTERNAL FIXATION (ORIF) DISTAL RADIAL FRACTURE;  Surgeon: [name], MD;  Location: [facility];  Service: Orthopedics;  Laterality: Left;   stab phlebectomy  Right 11/09/2016   stab phlebectomy R leg by Tinnie Gens MD   TOOTH EXTRACTION     Patient Active Problem List   Diagnosis Date Noted   Bilateral primary osteoarthritis of hip 08/13/2021   Statin intolerance 06/24/2021   Left knee pain 07/23/2020   Hip pain 01/26/2020   Spinal stenosis of lumbar region 01/16/2020   Chronic lumbar radiculopathy 12/08/2019   Hyperlipidemia associated with type 2 diabetes mellitus ([place]) 12/08/2019   Dizziness 11/03/2019   Paronychia of left index finger 05/19/2019   Fall at  home 11/24/2018   Rosacea 10/24/2018   Loss of weight 09/09/2018   Schizoaffective disorder, depressive type ([place]) 04/08/2018   Varicose veins of bilateral lower extremities with other complications 78/93/8101   Erectile dysfunction 04/26/2015   Hypothyroidism    Seizures ([place])    Persistent atrial fibrillation ([facility])    BMI 37.0-37.9, adult    Seborrheic dermatitis 02/10/2007   T2DM (type 2 diabetes mellitus) ([place]) 02/10/2007    PCP: Rise Patience, MD  REFERRING PROVIDER: Rise Patience, MD  REFERRING DIAG:  W19.XXXA,Y92.009 (ICD-10-CM) - Fall in home, initial encounter  M16.0 (ICD-10-CM) - Bilateral primary osteoarthritis of hip    Rationale for Evaluation and Treatment Rehabilitation  THERAPY DIAG:  Other abnormalities of gait and mobility - Plan: PT plan of care cert/re-cert  Muscle weakness (generalized) - Plan: PT plan of care cert/re-cert  Pain in right hip - Plan: PT plan of care cert/re-cert  Pain in left hip - Plan: PT plan of care cert/re-cert  Other low back pain - Plan: PT plan of care cert/re-cert  ONSET DATE: fall 1 month ago.  Chronic lumbar and LE pain  SUBJECTIVE:  SUBJECTIVE STATEMENT: Pt reports to PT s/p fall he sustained when he missed a step with descending the steps about a month ago.  X-rays were negative.  Pt reports Rt LE pain  and exacerbation of his LBP from the fall.    PERTINENT HISTORY:  A-fib, chronic LBP, depression, schizophrenia, tremors from medication  PAIN:  Are you having pain? Yes: NPRS scale: 6-7/10 Pain location: Rt LE Pain description: stiff, sore Aggravating factors: sitting/driving, walking  Relieving factors: stretching, rest, heating pad   Walking limited to 10 minutes-baseline for this patient   PRECAUTIONS: Fall  WEIGHT BEARING  RESTRICTIONS No  FALLS:  Has patient fallen in last 6 months? Yes. Number of falls 1, fell when descending steps.  PT will address balance in treatment.    LIVING ENVIRONMENT: Lives with: lives alone Lives in: House/apartment Stairs: Yes: External: 12-13  Has following equipment at home: None  OCCUPATION: on disability  PLOF: Independent with basic ADLs  PATIENT GOALS reduce Rt LE pain, improve balance and mobility    OBJECTIVE:   DIAGNOSTIC FINDINGS:  X-ray of hips and low back on 02/09/22: DDD in lumbar spine, otherwise unremarkable    SCREENING FOR RED FLAGS: Bowel or bladder incontinence: No Spinal tumors: No Cauda equina syndrome: No Compression fracture: No Abdominal aneurysm: No  COGNITION:  Overall cognitive status: History of cognitive impairments - at baseline     SENSATION: WFL  MUSCLE LENGTH: Limited by 50% bil.   POSTURE: rounded shoulders, forward head, and flexed trunk   PALPATION: Mild palpable tenderness and trigger points in Rt gluteals and Rt>Lt lumbar spine   LUMBAR ROM:  WFLs, baseline for this patient. Rt lumbar pain reported with A/ROM  LOWER EXTREMITY ROM:    IR limited by 50% bil     LOWER EXTREMITY MMT:    Rt hip 4/5, Lt hip 4+/5, Bil knees 4+/5 with pain reported with testing.   FUNCTIONAL TESTS:  5 times sit to stand: 20 seconds  LEFS: 48.75%   GAIT: Distance walked: 100 Assistive device utilized: None Level of assistance: Complete Independence Comments: reduced trunk rotation, flexed trunk, ER at bil hips  TODAY/'S TREATMENT  Date: 03/04/22 HEP established-see below   PATIENT EDUCATION:  Education details: Access Code: [email or web address] Person educated: Patient Education method: Consulting civil engineer, Media planner, and Handouts Education comprehension: verbalized understanding and returned demonstration   HOME EXERCISE PROGRAM: Access Code: [email or web address] URL: [email or web address] Date: 03/04/2022 Prepared by:  Claiborne Billings  Exercises - Seated Long Arc Quad  - 3 x daily - 7 x weekly - 2 sets - 10 reps - 5 hold - Seated March   - 3 x daily - 7 x weekly - 3 sets - 10 reps - Seated Heel Toe Raises   - 3 x daily - 7 x weekly - 2 sets - 10 reps - Sit to Stand Without Arm Support  - 2 x daily - 7 x weekly - 1 sets - 10 reps - Seated Hamstring Stretch  - 3 x daily - 7 x weekly - 1 sets - 3 reps - 20 hold  ASSESSMENT:  CLINICAL IMPRESSION: Patient is a 61 y.o. male  who was seen today for physical therapy evaluation and treatment for Rt hip pain.  Pt has a history of chronic lumbar and hip pain and limited mobility related to this. Pt had a fall ~1 month ago.  Pt reports 6-7/10 LBP and Rt LE pain that is worse with standing and walking.  Pt performed 5 x sit  to stand in 20 seconds indicating a falls risk.  He ambulates with ER at bil hips with reduced trunk rotation.  He negotiates steps with step-to gait with descending and step-over-step with ascending with use of 1 rail.  Mild palpable tenderness over Rt>Lt lumbar paraspinals and Rt gluteals.  Patient will benefit from skilled PT to address the below impairments and improve overall function.    OBJECTIVE IMPAIRMENTS Abnormal gait, decreased activity tolerance, decreased mobility, difficulty walking, decreased strength, increased muscle spasms, impaired flexibility, improper body mechanics, postural dysfunction, and pain.   ACTIVITY LIMITATIONS lifting, sitting, standing, squatting, stairs, dressing, and locomotion level  PARTICIPATION LIMITATIONS: meal prep, cleaning, laundry, and community activity  PERSONAL FACTORS Fitness, Past/current experiences, and 1-2 comorbidities: depression, chronic pain  are also affecting patient's functional outcome.   REHAB POTENTIAL: Good  CLINICAL DECISION MAKING: Evolving/moderate complexity  EVALUATION COMPLEXITY: Moderate   GOALS: Goals reviewed with patient? Yes  SHORT TERM GOALS: Target date: 04/01/2022  Be  independent in initial HEP Baseline: Goal status: INITIAL  2.  Improve LEFS to > or = to 60% to improve function Baseline: 45% Goal status: INITIAL  3.  Report > or = to 30% reduction in LBP and Rt LE pain with ADLs and self-care  Baseline:  Goal status: INITIAL    LONG TERM GOALS: Target date: 04/29/2022  Be independent in advanced HEP Baseline:  Goal status: INITIAL  2.  Improve LEFS to > or = to 70% to improve function Baseline: 45% Goal status: INITIAL  3.  Perform 5x sit to stand in < or = to 14 seconds to reduce falls risk  Baseline: 20 seconds  Goal status: INITIAL  4.  Report > or = to 60% reduction in LBP and Rt LE pain with ADLs and self-care Baseline:  Goal status: INITIAL    PLAN: PT FREQUENCY: 1-2x/week  PT DURATION: 8 weeks  PLANNED INTERVENTIONS: Therapeutic exercises, Therapeutic activity, Neuromuscular re-education, Balance training, Gait training, Patient/Family education, Self Care, Joint mobilization, Stair training, Aquatic Therapy, Dry Needling, Electrical stimulation, Spinal manipulation, Spinal mobilization, Cryotherapy, Moist heat, Taping, Ultrasound, Manual therapy, and Re-evaluation.  PLAN FOR NEXT SESSION: addaday to low back and Rt hip, flexibility, NuStep, balance and strength    Sigurd Sos, PT 03/04/22 2:58 PM    [facility] [address] [place], [place] [address] Phone # [phone] Fax [phone]

## 2022-03-04 NOTE — Telephone Encounter (Signed)
Prior Auth for patients medication LIDOCAINE PATCHES denied by [employer] via CoverMyMeds.   Reason: Your plan does not allow coverage of this medication based on your prescriber answering No to the following question(s): Is the requested drug being prescribed for any of the following: A) Pain associated with post-herpetic neuralgia, B) Pain associated with diabetic neuropathy, C) Pain associated with cancer-related neuropathy  MEDICATION AVAILABLE OTC  CoverMyMeds Key: [id number]

## 2022-03-05 ENCOUNTER — Ambulatory Visit (INDEPENDENT_AMBULATORY_CARE_PROVIDER_SITE_OTHER): Payer: Medicare HMO | Admitting: Diagnostic Neuroimaging

## 2022-03-05 ENCOUNTER — Encounter: Payer: Self-pay | Admitting: Diagnostic Neuroimaging

## 2022-03-05 VITALS — BP 136/93 | HR 69 | Ht 70.5 in | Wt 255.2 lb

## 2022-03-05 DIAGNOSIS — R569 Unspecified convulsions: Secondary | ICD-10-CM | POA: Diagnosis not present

## 2022-03-05 NOTE — Progress Notes (Signed)
[facility]  PATIENT: [name] DOB: 04/20/1961  REFERRING CLINICIAN: Kathlee Nations, MD HISTORY FROM: patient REASON FOR VISIT: new consult   HISTORICAL  CHIEF COMPLAINT:  Chief Complaint  Patient presents with   New Patient (Initial Visit)    Pt is sore. He states he is doing PT at [facility]. He thinks is medication Trilafon is causing his tremors from what his PCP told him it would cause side effects.  Room 7 alone    HISTORY OF PRESENT ILLNESS:   61 year old male here for evaluation of seizure disorder.  History of depression, OCD, schizoaffective disorder.  Around age 21 years old patient had some staring spells, saw neurologist and was diagnosed with temporal lobe seizures.  Started on Depakote which seemed to help.  He only had 1 or 2 more events around age 30 years old.  He has done well on Depakote for many years.  March 2023 patient had an episode of confusion, crying spells, concern for possible temporal lobe seizure.  He was referred to neurology at that time.  In retrospect patient does not think this was a seizure event but rather a depression episode.  Patient also having some issues with memory, focus, attention.  These were also evaluated in 2015, and at that time felt to be related to underlying mental health diagnoses and stress factors.  Patient lives alone.  He is independent of all ADLs.  REVIEW OF SYSTEMS: Full 14 system review of systems performed and negative with exception of: as per HPI.  ALLERGIES: Allergies  Allergen Reactions   Lamictal [Lamotrigine] Rash   Metformin And Related Rash    HOME MEDICATIONS: Outpatient Medications Prior to Visit  Medication Sig Dispense Refill   allopurinol (ZYLOPRIM) 300 MG tablet TAKE 1 TABLET BY MOUTH EVERY DAY 90 tablet 1   antiseptic oral rinse (BIOTENE) LIQD 15 mLs by Mouth Rinse route as needed for dry mouth. 473 mL 0   benztropine (COGENTIN) 1 MG tablet Take 1 tablet (1 mg  total) by mouth daily. 90 tablet 0   divalproex (DEPAKOTE) 500 MG DR tablet Take 1 tablet (500 mg total) by mouth 2 (two) times daily. 180 tablet 0   ELIQUIS 5 MG TABS tablet TAKE 1 TABLET BY MOUTH TWICE A DAY 60 tablet 11   levothyroxine (SYNTHROID) 100 MCG tablet Take 1 tablet (100 mcg total) by mouth daily. 90 tablet 3   lidocaine (LIDODERM) 5 % Place 1 patch onto the skin daily. Remove & Discard patch within 12 hours or as directed by MD 30 patch 0   perphenazine (TRILAFON) 8 MG tablet Take 3 tablets (24 mg total) by mouth at bedtime. 270 tablet 0   phenelzine (NARDIL) 15 MG tablet Take 3 tablets (45 mg total) by mouth 2 (two) times daily. 540 tablet 0   atorvastatin (LIPITOR) 40 MG tablet TAKE 1 TABLET BY MOUTH EVERY DAY (Patient not taking: Reported on 03/05/2022) 90 tablet 1   blood glucose meter kit and supplies KIT Dispense based on patient and insurance preference. Use up to four times daily as directed. (Patient not taking: Reported on 03/05/2022) 1 each 0   diazepam (VALIUM) 5 MG tablet Take 1 tablet (5 mg total) by mouth every 6 (six) hours as needed (spasms). (Patient not taking: Reported on 03/05/2022) 10 tablet 0   diclofenac Sodium (VOLTAREN) 1 % GEL Apply 4 g topically 4 (four) times daily. (Patient not taking: Reported on 03/05/2022) 4 g 0   ONETOUCH  VERIO test strip TEST UP TO 4 TIMES A DAY (Patient not taking: Reported on 03/05/2022) 100 strip 2   sildenafil (VIAGRA) 25 MG tablet Take 1 tablet (25 mg total) by mouth daily as needed for erectile dysfunction. (Patient not taking: Reported on 03/05/2022) 10 tablet 0   No facility-administered medications prior to visit.    PAST MEDICAL HISTORY: Past Medical History:  Diagnosis Date   A-fib [facility])    Acute right-sided low back pain    Arrhythmia    Chronic a-fib (HCC)    Closed fracture of left distal radius    Contact dermatitis 04/16/2017   Depression    Depression    Hypothyroidism    Low back pain    Morbid obesity, BMI  unknown (HCC)    Osteoarthritis    oa in bilateral knees   Schizophrenia ([place])    Seizures (HCC)    Thyroid disease    Varicose veins     PAST SURGICAL HISTORY: Past Surgical History:  Procedure Laterality Date   ACHILLES TENDON REPAIR  approx. 2004   right foot   CATARACT EXTRACTION     bilateral   OPEN REDUCTION INTERNAL FIXATION (ORIF) DISTAL RADIAL FRACTURE Left 07/21/2018   Procedure: OPEN REDUCTION INTERNAL FIXATION (ORIF) DISTAL RADIAL FRACTURE;  Surgeon: [name], MD;  Location: [place];  Service: Orthopedics;  Laterality: Left;   stab phlebectomy  Right 11/09/2016   stab phlebectomy R leg by Tinnie Gens MD   TOOTH EXTRACTION      FAMILY HISTORY: Family History  Problem Relation Age of Onset   Diabetes Father    Heart disease Father    Diabetes Brother    OCD Mother     SOCIAL HISTORY: Social History   Socioeconomic History   Marital status: Single    Spouse name: Not on file   Number of children: Not on file   Years of education: Not on file   Highest education level: Not on file  Occupational History   Occupation: disabled  Tobacco Use   Smoking status: Never   Smokeless tobacco: Never  Vaping Use   Vaping Use: Never used  Substance and Sexual Activity   Alcohol use: No    Alcohol/week: 0.0 standard drinks of alcohol   Drug use: No   Sexual activity: Never  Other Topics Concern   Not on file  Social History Narrative   Not on file   Social Determinants of Health   Financial Resource Strain: Not on file  Food Insecurity: Not on file  Transportation Needs: Not on file  Physical Activity: Not on file  Stress: Not on file  Social Connections: Not on file  Intimate Partner Violence: Not on file     PHYSICAL EXAM  GENERAL EXAM/CONSTITUTIONAL: Vitals:  Vitals:   03/05/22 0814  BP: (!) 136/93  Pulse: 69  Weight: 255 lb 4 oz (115.8 kg)  Height: 5' 10.5" (1.791 m)   Body mass index is 36.11 kg/m. Wt Readings from  Last 3 Encounters:  03/05/22 255 lb 4 oz (115.8 kg)  03/02/22 257 lb (116.6 kg)  02/06/22 257 lb 4 oz (116.7 kg)   Patient is in no distress; well developed, nourished and groomed; neck is supple  CARDIOVASCULAR: Examination of carotid arteries is normal; no carotid bruits IRREGULAR RATE AND RHYTHM Examination of peripheral vascular system by observation and palpation is normal  EYES: Ophthalmoscopic exam of optic discs and posterior segments is normal; no papilledema or hemorrhages No  results found.  MUSCULOSKELETAL: Gait, strength, tone, movements noted in Neurologic exam below  NEUROLOGIC: MENTAL STATUS:      No data to display         awake, alert, oriented to person, place and time recent and remote memory intact normal attention and concentration language fluent, comprehension intact, naming intact fund of knowledge appropriate  CRANIAL NERVE:  2nd - no papilledema on fundoscopic exam 2nd, 3rd, 4th, 6th - pupils equal and reactive to light, visual fields full to confrontation, extraocular muscles intact, no nystagmus 5th - facial sensation symmetric 7th - facial strength symmetric 8th - hearing intact 9th - palate elevates symmetrically, uvula midline 11th - shoulder shrug symmetric 12th - tongue protrusion midline  MOTOR:  normal bulk and tone, full strength in the BUE, BLE MILD INTERMITTENT RESTING TREMOR IN BUE NO RIGIDITY NO BRADYKINESIA  SENSORY:  normal and symmetric to light touch, temperature, vibration  COORDINATION:  finger-nose-finger, fine finger movements MILD INTENTION TREMOR L >R  REFLEXES:  deep tendon reflexes 1+ and symmetric  GAIT/STATION:  narrow based gait     DIAGNOSTIC DATA (LABS, IMAGING, TESTING) - I reviewed patient records, labs, notes, testing and imaging myself where available.  Lab Results  Component Value Date   WBC 8.5 12/31/2020   HGB 16.1 12/31/2020   HCT 48.5 12/31/2020   MCV 92 12/31/2020   PLT 194  12/31/2020      Component Value Date/Time   NA 138 12/31/2020 1353   K 4.9 12/31/2020 1353   CL 96 12/31/2020 1353   CO2 26 12/31/2020 1353   GLUCOSE 122 (H) 12/31/2020 1353   GLUCOSE 116 (H) 01/06/2018 2359   BUN 15 12/31/2020 1353   CREATININE 0.90 12/31/2020 1353   CREATININE 0.94 07/16/2016 1446   CALCIUM 9.3 12/31/2020 1353   PROT 6.7 12/31/2020 1353   ALBUMIN 4.3 12/31/2020 1353   AST 26 12/31/2020 1353   ALT 11 12/31/2020 1353   ALKPHOS 122 (H) 12/31/2020 1353   BILITOT 0.4 12/31/2020 1353   GFRNONAA 89 11/02/2019 1711   GFRNONAA >89 07/16/2016 1446   GFRAA 103 11/02/2019 1711   GFRAA >89 07/16/2016 1446   Lab Results  Component Value Date   CHOL 194 12/18/2020   HDL 27 (L) 12/18/2020   LDLCALC 113 (H) 12/18/2020   TRIG 309 (H) 12/18/2020   CHOLHDL 7.2 (H) 12/18/2020   Lab Results  Component Value Date   HGBA1C 6.7 09/15/2021   Lab Results  Component Value Date   VITAMINB12 492 10/13/2013   Lab Results  Component Value Date   TSH 1.330 12/18/2020    12/21/21 CT head  1. No acute intracranial pathology. Moderate age-related atrophy and chronic microvascular ischemic changes. 2. No acute cervical spine fracture or subluxation.    ASSESSMENT AND PLAN  61 y.o. year old male here with:   Dx:  1. Seizures ([place])       PLAN:  History of temporal lobe seizures (dx'd age 4 years old; staring spells; last major events at age ~61 years old) - continue depakote $RemoveBeforeDEI'500mg'oqpDXVnnJhxKpCQV$  twice a day; unclear if he actually had an event in March 2023 or this may have been a depression spell - discussed tremor and weight gain side effects of depakote; however patient comfortable with depakote, and would like to stay on it long term  Memory loss / attention difficulties (since ~2013) - independent of ADLs currently; cognitive deficits could be related to underlying depression, OCD, schizoaffective disorder, seizure disorder and  polypharmacy - recommend supportive care -  safety / supervision issues reviewed - daily physical activity / exercise (at least 15-30 minutes) - eat more plants / vegetables - increase social activities, brain stimulation, games, puzzles, hobbies, crafts, arts, music - aim for at least 7-8 hours sleep per night (or more) - avoid smoking and alcohol - caution with medications, finances, driving, living alone  Return for return to PCP, pending if symptoms worsen or fail to improve.    Penni Bombard, MD 10/19/8307, 4:07 AM Certified in Neurology, Neurophysiology and Neuroimaging  [facility] [address] [place], [place] [address] [phone]

## 2022-03-05 NOTE — Patient Instructions (Signed)
  History of temporal lobe seizures (dx'd age 61 years old; staring spells) - continue depakote '500mg'$  twice a day; unclear if he actually had an event in March 2023 - discussed tremor and weight gain side effects of depakote; however patient comfortable with depakote, and would like to stay on it long term  Memory loss / attention difficulties (since ~2013) - independent of ADLs currently; cognitive deficits could be related to underlying depression, OCD, schizoaffective disorder, seizure disorder and polypharmacy - recommend supportive care - safety / supervision issues reviewed - daily physical activity / exercise (at least 15-30 minutes) - eat more plants / vegetables - increase social activities, brain stimulation, games, puzzles, hobbies, crafts, arts, music - aim for at least 7-8 hours sleep per night (or more) - avoid smoking and alcohol - caution with medications, finances, driving, living alone

## 2022-03-18 ENCOUNTER — Ambulatory Visit: Payer: Medicare HMO | Attending: Family Medicine

## 2022-03-18 DIAGNOSIS — M5441 Lumbago with sciatica, right side: Secondary | ICD-10-CM | POA: Diagnosis not present

## 2022-03-18 DIAGNOSIS — M25562 Pain in left knee: Secondary | ICD-10-CM | POA: Insufficient documentation

## 2022-03-18 DIAGNOSIS — M25551 Pain in right hip: Secondary | ICD-10-CM

## 2022-03-18 DIAGNOSIS — M25561 Pain in right knee: Secondary | ICD-10-CM | POA: Diagnosis not present

## 2022-03-18 DIAGNOSIS — M25552 Pain in left hip: Secondary | ICD-10-CM

## 2022-03-18 DIAGNOSIS — G8929 Other chronic pain: Secondary | ICD-10-CM | POA: Diagnosis not present

## 2022-03-18 DIAGNOSIS — M6281 Muscle weakness (generalized): Secondary | ICD-10-CM | POA: Diagnosis not present

## 2022-03-18 DIAGNOSIS — R2689 Other abnormalities of gait and mobility: Secondary | ICD-10-CM

## 2022-03-18 DIAGNOSIS — M5459 Other low back pain: Secondary | ICD-10-CM

## 2022-03-18 NOTE — Therapy (Signed)
OUTPATIENT PHYSICAL THERAPY THORACOLUMBAR EVALUATION   Patient Name: [name] MRN: [id number] DOB:[birth year], 61 y.o., male Today's Date: 03/18/2022   PT End of Session - 03/18/22 1446     Visit Number 2    Date for PT Re-Evaluation 04/29/22    Authorization Type [employer]    Progress Note Due on Visit 10    PT Start Time 1401    PT Stop Time 1446    PT Time Calculation (min) 45 min    Activity Tolerance Patient tolerated treatment well    Behavior During Therapy WFL for tasks assessed/performed              Past Medical History:  Diagnosis Date   A-fib ([place])    Acute right-sided low back pain    Arrhythmia    Chronic a-fib (HCC)    Closed fracture of left distal radius    Contact dermatitis 04/16/2017   Depression    Depression    Hypothyroidism    Low back pain    Morbid obesity, BMI unknown ([place])    Osteoarthritis    oa in bilateral knees   Schizophrenia ([place])    Seizures ([place])    Thyroid disease    Varicose veins    Past Surgical History:  Procedure Laterality Date   ACHILLES TENDON REPAIR  approx. 2004   right foot   CATARACT EXTRACTION     bilateral   OPEN REDUCTION INTERNAL FIXATION (ORIF) DISTAL RADIAL FRACTURE Left 07/21/2018   Procedure: OPEN REDUCTION INTERNAL FIXATION (ORIF) DISTAL RADIAL FRACTURE;  Surgeon: [name], MD;  Location: [place];  Service: Orthopedics;  Laterality: Left;   stab phlebectomy  Right 11/09/2016   stab phlebectomy R leg by Tinnie Gens MD   TOOTH EXTRACTION     Patient Active Problem List   Diagnosis Date Noted   Bilateral primary osteoarthritis of hip 08/13/2021   Statin intolerance 06/24/2021   Left knee pain 07/23/2020   Hip pain 01/26/2020   Spinal stenosis of lumbar region 01/16/2020   Chronic lumbar radiculopathy 12/08/2019   Hyperlipidemia associated with type 2 diabetes mellitus ([place]) 12/08/2019   Dizziness 11/03/2019   Paronychia of left index finger 05/19/2019   Fall at  home 11/24/2018   Rosacea 10/24/2018   Loss of weight 09/09/2018   Schizoaffective disorder, depressive type ([place]) 04/08/2018   Varicose veins of bilateral lower extremities with other complications 31/51/7616   Erectile dysfunction 04/26/2015   Hypothyroidism    Seizures ([place])    Persistent atrial fibrillation (HCC)    BMI 37.0-37.9, adult    Seborrheic dermatitis 02/10/2007   T2DM (type 2 diabetes mellitus) ([place]) 02/10/2007    PCP: Rise Patience, MD  REFERRING PROVIDER: Rise Patience, MD  REFERRING DIAG:  W19.XXXA,Y92.009 (ICD-10-CM) - Fall in home, initial encounter  M16.0 (ICD-10-CM) - Bilateral primary osteoarthritis of hip    Rationale for Evaluation and Treatment Rehabilitation  THERAPY DIAG:  Other abnormalities of gait and mobility  Muscle weakness (generalized)  Pain in right hip  Pain in left hip  Other low back pain  ONSET DATE: fall 1 month ago.  Chronic lumbar and LE pain  SUBJECTIVE:  SUBJECTIVE STATEMENT: I haven't been doing to [employer], my hip has been hurting me.    PERTINENT HISTORY:  A-fib, chronic LBP, depression, schizophrenia, tremors from medication  PAIN:  Are you having pain? Yes: NPRS scale: 6/10 Pain location: Rt LE Pain description: stiff, sore Aggravating factors: sitting/driving, walking  Relieving factors: stretching, rest, heating pad   Walking limited to 10 minutes-baseline for this patient   PRECAUTIONS: Fall  WEIGHT BEARING RESTRICTIONS No  FALLS:  Has patient fallen in last 6 months? Yes. Number of falls 1, fell when descending steps.  PT will address balance in treatment.    LIVING ENVIRONMENT: Lives with: lives alone Lives in: House/apartment Stairs: Yes: External: 12-13  Has following equipment at home:  None  OCCUPATION: on disability  PLOF: Independent with basic ADLs  PATIENT GOALS reduce Rt LE pain, improve balance and mobility    OBJECTIVE:   DIAGNOSTIC FINDINGS:  X-ray of hips and low back on 02/09/22: DDD in lumbar spine, otherwise unremarkable   COGNITION:  Overall cognitive status: History of cognitive impairments - at baseline     SENSATION: WFL  MUSCLE LENGTH: Limited by 50% bil.   POSTURE: rounded shoulders, forward head, and flexed trunk   PALPATION: Mild palpable tenderness and trigger points in Rt gluteals and Rt>Lt lumbar spine   LUMBAR ROM:  WFLs, baseline for this patient. Rt lumbar pain reported with A/ROM  LOWER EXTREMITY ROM:    IR limited by 50% bil   LOWER EXTREMITY MMT:   Rt hip 4/5, Lt hip 4+/5, Bil knees 4+/5 with pain reported with testing.   FUNCTIONAL TESTS:  5 times sit to stand: 20 seconds  LEFS: 48.75%   GAIT: Distance walked: 100 Assistive device utilized: None Level of assistance: Complete Independence Comments: reduced trunk rotation, flexed trunk, ER at bil hips  TODAY/'S TREATMENT  Date: 03/18/22 NuStep-Level 1x 6 minutes (seat 12, arms 12)-PT present to discuss progress Long arc quads: 5" hold 2x10 Seated marching: 2x10 Heel/toe raises in sitting 2x10 Standing heel raises: 2x10 Standing hip abduction 2x10 Manual therapy: Addaday to Rt hip and lumbar spine  Date: 03/04/22 HEP established-see below   PATIENT EDUCATION:  Education details: Access Code: [email or web address] Person educated: Patient Education method: Explanation, Demonstration, and Handouts Education comprehension: verbalized understanding and returned demonstration   HOME EXERCISE PROGRAM: Access Code: [email or web address] URL: [email or web address] Date: 03/04/2022 Prepared by: Claiborne Billings  Exercises - Seated Long Arc Quad  - 3 x daily - 7 x weekly - 2 sets - 10 reps - 5 hold - Seated March   - 3 x daily - 7 x weekly - 3 sets - 10 reps - Seated Heel Toe  Raises   - 3 x daily - 7 x weekly - 2 sets - 10 reps - Sit to Stand Without Arm Support  - 2 x daily - 7 x weekly - 1 sets - 10 reps - Seated Hamstring Stretch  - 3 x daily - 7 x weekly - 1 sets - 3 reps - 20 hold  ASSESSMENT:  CLINICAL IMPRESSION: First time follow-up after evaluation.  Pt has not gone to the gym as he reports too much pain to try this.  Pt reports that he has been doing his exercises somewhat at home.  Pt denies any change in his symptoms since the start of care.  PT educated pt regarding the importance of physical activity to address his pain and mobility limitations.   Patient will benefit from skilled PT to address  the below impairments and improve overall function.    OBJECTIVE IMPAIRMENTS Abnormal gait, decreased activity tolerance, decreased mobility, difficulty walking, decreased strength, increased muscle spasms, impaired flexibility, improper body mechanics, postural dysfunction, and pain.   ACTIVITY LIMITATIONS lifting, sitting, standing, squatting, stairs, dressing, and locomotion level  PARTICIPATION LIMITATIONS: meal prep, cleaning, laundry, and community activity  PERSONAL FACTORS Fitness, Past/current experiences, and 1-2 comorbidities: depression, chronic pain  are also affecting patient's functional outcome.   REHAB POTENTIAL: Good  CLINICAL DECISION MAKING: Evolving/moderate complexity  EVALUATION COMPLEXITY: Moderate   GOALS: Goals reviewed with patient? Yes  SHORT TERM GOALS: Target date: 04/01/2022  Be independent in initial HEP Baseline: Goal status: INITIAL  2.  Improve LEFS to > or = to 60% to improve function Baseline: 45% Goal status: INITIAL  3.  Report > or = to 30% reduction in LBP and Rt LE pain with ADLs and self-care  Baseline:  Goal status: INITIAL    LONG TERM GOALS: Target date: 04/29/2022  Be independent in advanced HEP Baseline:  Goal status: INITIAL  2.  Improve LEFS to > or = to 70% to improve  function Baseline: 45% Goal status: INITIAL  3.  Perform 5x sit to stand in < or = to 14 seconds to reduce falls risk  Baseline: 20 seconds  Goal status: INITIAL  4.  Report > or = to 60% reduction in LBP and Rt LE pain with ADLs and self-care Baseline:  Goal status: INITIAL    PLAN: PT FREQUENCY: 1-2x/week  PT DURATION: 8 weeks  PLANNED INTERVENTIONS: Therapeutic exercises, Therapeutic activity, Neuromuscular re-education, Balance training, Gait training, Patient/Family education, Self Care, Joint mobilization, Stair training, Aquatic Therapy, Dry Needling, Electrical stimulation, Spinal manipulation, Spinal mobilization, Cryotherapy, Moist heat, Taping, Ultrasound, Manual therapy, and Re-evaluation.  PLAN FOR NEXT SESSION: addaday to low back and Rt hip, flexibility, NuStep, balance and strength    Sigurd Sos, PT 03/18/22 2:47 PM    [facility] [address] [place], [place] [address] Phone # [phone] Fax [phone]

## 2022-03-19 NOTE — Therapy (Unsigned)
OUTPATIENT PHYSICAL THERAPY THORACOLUMBAR EVALUATION   Patient Name: [name] MRN: [id number] DOB:17-Nov-1960, 61 y.o., male Today's Date: 03/20/2022   PT End of Session - 03/20/22 1058     Visit Number 3    Date for PT Re-Evaluation 04/29/22    Authorization Type [employer]    Progress Note Due on Visit 10    PT Start Time 1057    PT Stop Time 1140    PT Time Calculation (min) 43 min    Activity Tolerance Patient tolerated treatment well    Behavior During Therapy WFL for tasks assessed/performed               Past Medical History:  Diagnosis Date   A-fib ([place])    Acute right-sided low back pain    Arrhythmia    Chronic a-fib ([facility])    Closed fracture of left distal radius    Contact dermatitis 04/16/2017   Depression    Depression    Hypothyroidism    Low back pain    Morbid obesity, BMI unknown ([place])    Osteoarthritis    oa in bilateral knees   Schizophrenia ([place])    Seizures ([place])    Thyroid disease    Varicose veins    Past Surgical History:  Procedure Laterality Date   ACHILLES TENDON REPAIR  approx. 2004   right foot   CATARACT EXTRACTION     bilateral   OPEN REDUCTION INTERNAL FIXATION (ORIF) DISTAL RADIAL FRACTURE Left 07/21/2018   Procedure: OPEN REDUCTION INTERNAL FIXATION (ORIF) DISTAL RADIAL FRACTURE;  Surgeon: [name], MD;  Location: [facility];  Service: Orthopedics;  Laterality: Left;   stab phlebectomy  Right 11/09/2016   stab phlebectomy R leg by Tinnie Gens MD   TOOTH EXTRACTION     Patient Active Problem List   Diagnosis Date Noted   Bilateral primary osteoarthritis of hip 08/13/2021   Statin intolerance 06/24/2021   Left knee pain 07/23/2020   Hip pain 01/26/2020   Spinal stenosis of lumbar region 01/16/2020   Chronic lumbar radiculopathy 12/08/2019   Hyperlipidemia associated with type 2 diabetes mellitus ([place]) 12/08/2019   Dizziness 11/03/2019   Paronychia of left index finger 05/19/2019   Fall  at home 11/24/2018   Rosacea 10/24/2018   Loss of weight 09/09/2018   Schizoaffective disorder, depressive type ([place]) 04/08/2018   Varicose veins of bilateral lower extremities with other complications 70/26/3785   Erectile dysfunction 04/26/2015   Hypothyroidism    Seizures ([place])    Persistent atrial fibrillation (H[facility])    BMI 37.0-37.9, adult    Seborrheic dermatitis 02/10/2007   T2DM (type 2 diabetes mellitus) ([place]) 02/10/2007    PCP: Rise Patience, MD  REFERRING PROVIDER: Rise Patience, MD  REFERRING DIAG:  W19.XXXA,Y92.009 (ICD-10-CM) - Fall in home, initial encounter  M16.0 (ICD-10-CM) - Bilateral primary osteoarthritis of hip    Rationale for Evaluation and Treatment Rehabilitation  THERAPY DIAG:  Muscle weakness (generalized)  Other abnormalities of gait and mobility  Pain in left hip  Pain in right hip  Other low back pain  Chronic pain of left knee  Chronic bilateral low back pain with right-sided sciatica  Chronic pain of right knee  ONSET DATE: fall 1 month ago.  Chronic lumbar and LE pain  SUBJECTIVE:  SUBJECTIVE STATEMENT: I had increased pain after my last session.   PERTINENT HISTORY:  A-fib, chronic LBP, depression, schizophrenia, tremors from medication  PAIN:  Are you having pain? Yes: NPRS scale: 8/10 Pain location: Rt LE Pain description: stiff, sore Aggravating factors: sitting/driving, walking  Relieving factors: stretching, rest, heating pad   Walking limited to 10 minutes-baseline for this patient   PRECAUTIONS: Fall  WEIGHT BEARING RESTRICTIONS No  FALLS:  Has patient fallen in last 6 months? Yes. Number of falls 1, fell when descending steps.  PT will address balance in treatment.    LIVING ENVIRONMENT: Lives with: lives alone Lives in:  House/apartment Stairs: Yes: External: 12-13  Has following equipment at home: None  OCCUPATION: on disability  PLOF: Independent with basic ADLs  PATIENT GOALS reduce Rt LE pain, improve balance and mobility    OBJECTIVE:   DIAGNOSTIC FINDINGS:  X-ray of hips and low back on 02/09/22: DDD in lumbar spine, otherwise unremarkable   COGNITION:  Overall cognitive status: History of cognitive impairments - at baseline     SENSATION: WFL  MUSCLE LENGTH: Limited by 50% bil.   POSTURE: rounded shoulders, forward head, and flexed trunk   PALPATION: Mild palpable tenderness and trigger points in Rt gluteals and Rt>Lt lumbar spine   LUMBAR ROM:  WFLs, baseline for this patient. Rt lumbar pain reported with A/ROM  LOWER EXTREMITY ROM:    IR limited by 50% bil   LOWER EXTREMITY MMT:   Rt hip 4/5, Lt hip 4+/5, Bil knees 4+/5 with pain reported with testing.   FUNCTIONAL TESTS:  5 times sit to stand: 20 seconds  LEFS: 48.75%   GAIT: Distance walked: 100 Assistive device utilized: None Level of assistance: Complete Independence Comments: reduced trunk rotation, flexed trunk, ER at bil hips  TODAY/'S TREATMENT     03/20/22: NuStep-Level 1x 8 minutes (seat 12, arms 12)-PTA present to discuss progress Manual therapy: Addaday to Rt hip and lumbar spine Supine lower trunk rotation/A/ROM/gentle rocking 2x10 Single leg leg lengthener stretch with ankle dorsiflexion  3x bil  Sit to stand 5x 50% UE/50% LE    Date: 03/18/22 NuStep-Level 1x 6 minutes (seat 12, arms 12)-PT present to discuss progress Long arc quads: 5" hold 2x10 Seated marching: 2x10 Heel/toe raises in sitting 2x10 Standing heel raises: 2x10 Standing hip abduction 2x10 Manual therapy: Addaday to Rt hip and lumbar spine  Date: 03/04/22 HEP established-see below   PATIENT EDUCATION:  Education details: Access Code: [email or web address] Person educated: Patient Education method: Explanation, Demonstration, and  Handouts Education comprehension: verbalized understanding and returned demonstration   HOME EXERCISE PROGRAM: Access Code: [email or web address] URL: [email or web address] Date: 03/04/2022 Prepared by: Claiborne Billings  Exercises - Seated Long Arc Quad  - 3 x daily - 7 x weekly - 2 sets - 10 reps - 5 hold - Seated March   - 3 x daily - 7 x weekly - 3 sets - 10 reps - Seated Heel Toe Raises   - 3 x daily - 7 x weekly - 2 sets - 10 reps - Sit to Stand Without Arm Support  - 2 x daily - 7 x weekly - 1 sets - 10 reps - Seated Hamstring Stretch  - 3 x daily - 7 x weekly - 1 sets - 3 reps - 20 hold  ASSESSMENT:  CLINICAL IMPRESSION:  Pt reports increased back pain after last session. Pt was able to increase time on the Nustep and participate in supine gentle stretches he can do in  his bed before getting out in the AM. Addaday work was beneficial in decreasing his pain.   OBJECTIVE IMPAIRMENTS Abnormal gait, decreased activity tolerance, decreased mobility, difficulty walking, decreased strength, increased muscle spasms, impaired flexibility, improper body mechanics, postural dysfunction, and pain.   ACTIVITY LIMITATIONS lifting, sitting, standing, squatting, stairs, dressing, and locomotion level  PARTICIPATION LIMITATIONS: meal prep, cleaning, laundry, and community activity  PERSONAL FACTORS Fitness, Past/current experiences, and 1-2 comorbidities: depression, chronic pain  are also affecting patient's functional outcome.   REHAB POTENTIAL: Good  CLINICAL DECISION MAKING: Evolving/moderate complexity  EVALUATION COMPLEXITY: Moderate   GOALS: Goals reviewed with patient? Yes  SHORT TERM GOALS: Target date: 04/01/2022  Be independent in initial HEP Baseline: Goal status: INITIAL  2.  Improve LEFS to > or = to 60% to improve function Baseline: 45% Goal status: INITIAL  3.  Report > or = to 30% reduction in LBP and Rt LE pain with ADLs and self-care  Baseline:  Goal status:  INITIAL    LONG TERM GOALS: Target date: 04/29/2022  Be independent in advanced HEP Baseline:  Goal status: INITIAL  2.  Improve LEFS to > or = to 70% to improve function Baseline: 45% Goal status: INITIAL  3.  Perform 5x sit to stand in < or = to 14 seconds to reduce falls risk  Baseline: 20 seconds  Goal status: INITIAL  4.  Report > or = to 60% reduction in LBP and Rt LE pain with ADLs and self-care Baseline:  Goal status: INITIAL    PLAN: PT FREQUENCY: 1-2x/week  PT DURATION: 8 weeks  PLANNED INTERVENTIONS: Therapeutic exercises, Therapeutic activity, Neuromuscular re-education, Balance training, Gait training, Patient/Family education, Self Care, Joint mobilization, Stair training, Aquatic Therapy, Dry Needling, Electrical stimulation, Spinal manipulation, Spinal mobilization, Cryotherapy, Moist heat, Taping, Ultrasound, Manual therapy, and Re-evaluation.  PLAN FOR NEXT SESSION: addaday to low back and Rt hip, see if pt doing bed stretches (leg lengthener and lower trunk rotation.)   Myrene Galas, PTA 03/20/22 11:41 AM   [facility] [address] [place], [place] [address] Phone # [phone] Fax [phone]

## 2022-03-20 ENCOUNTER — Ambulatory Visit: Payer: Medicare HMO | Admitting: Physical Therapy

## 2022-03-20 ENCOUNTER — Encounter: Payer: Self-pay | Admitting: Physical Therapy

## 2022-03-20 DIAGNOSIS — M25561 Pain in right knee: Secondary | ICD-10-CM | POA: Diagnosis not present

## 2022-03-20 DIAGNOSIS — M5441 Lumbago with sciatica, right side: Secondary | ICD-10-CM | POA: Diagnosis not present

## 2022-03-20 DIAGNOSIS — M5459 Other low back pain: Secondary | ICD-10-CM | POA: Diagnosis not present

## 2022-03-20 DIAGNOSIS — G8929 Other chronic pain: Secondary | ICD-10-CM | POA: Diagnosis not present

## 2022-03-20 DIAGNOSIS — M25551 Pain in right hip: Secondary | ICD-10-CM

## 2022-03-20 DIAGNOSIS — R2689 Other abnormalities of gait and mobility: Secondary | ICD-10-CM

## 2022-03-20 DIAGNOSIS — M6281 Muscle weakness (generalized): Secondary | ICD-10-CM

## 2022-03-20 DIAGNOSIS — M25552 Pain in left hip: Secondary | ICD-10-CM

## 2022-03-20 DIAGNOSIS — M25562 Pain in left knee: Secondary | ICD-10-CM | POA: Diagnosis not present

## 2022-03-23 ENCOUNTER — Encounter: Payer: Self-pay | Admitting: Physical Therapy

## 2022-03-23 ENCOUNTER — Ambulatory Visit: Payer: Medicare HMO | Admitting: Physical Therapy

## 2022-03-23 DIAGNOSIS — G8929 Other chronic pain: Secondary | ICD-10-CM

## 2022-03-23 DIAGNOSIS — M25552 Pain in left hip: Secondary | ICD-10-CM

## 2022-03-23 DIAGNOSIS — M5459 Other low back pain: Secondary | ICD-10-CM | POA: Diagnosis not present

## 2022-03-23 DIAGNOSIS — M5441 Lumbago with sciatica, right side: Secondary | ICD-10-CM | POA: Diagnosis not present

## 2022-03-23 DIAGNOSIS — R2689 Other abnormalities of gait and mobility: Secondary | ICD-10-CM | POA: Diagnosis not present

## 2022-03-23 DIAGNOSIS — M25562 Pain in left knee: Secondary | ICD-10-CM | POA: Diagnosis not present

## 2022-03-23 DIAGNOSIS — M25551 Pain in right hip: Secondary | ICD-10-CM | POA: Diagnosis not present

## 2022-03-23 DIAGNOSIS — M25561 Pain in right knee: Secondary | ICD-10-CM | POA: Diagnosis not present

## 2022-03-23 DIAGNOSIS — M6281 Muscle weakness (generalized): Secondary | ICD-10-CM

## 2022-03-23 NOTE — Therapy (Signed)
OUTPATIENT PHYSICAL THERAPY THORACOLUMBAR EVALUATION   Patient Name: [name] MRN: [id number] DOB:June 23, 1961, 61 y.o., male Today's Date: 03/23/2022   PT End of Session - 03/23/22 1403     Visit Number 4    Date for PT Re-Evaluation 04/29/22    Authorization Type [employer] Medicare    Progress Note Due on Visit 10    PT Start Time 1400    PT Stop Time 1446    PT Time Calculation (min) 46 min    Activity Tolerance Patient tolerated treatment well    Behavior During Therapy WFL for tasks assessed/performed               Past Medical History:  Diagnosis Date   A-fib ([place])    Acute right-sided low back pain    Arrhythmia    Chronic a-fib ([facility])    Closed fracture of left distal radius    Contact dermatitis 04/16/2017   Depression    Depression    Hypothyroidism    Low back pain    Morbid obesity, BMI unknown ([place])    Osteoarthritis    oa in bilateral knees   Schizophrenia ([place])    Seizures ([place])    Thyroid disease    Varicose veins    Past Surgical History:  Procedure Laterality Date   ACHILLES TENDON REPAIR  approx. 2004   right foot   CATARACT EXTRACTION     bilateral   OPEN REDUCTION INTERNAL FIXATION (ORIF) DISTAL RADIAL FRACTURE Left 07/21/2018   Procedure: OPEN REDUCTION INTERNAL FIXATION (ORIF) DISTAL RADIAL FRACTURE;  Surgeon: [name], MD;  Location: [facility];  Service: Orthopedics;  Laterality: Left;   stab phlebectomy  Right 11/09/2016   stab phlebectomy R leg by Tinnie Gens MD   TOOTH EXTRACTION     Patient Active Problem List   Diagnosis Date Noted   Bilateral primary osteoarthritis of hip 08/13/2021   Statin intolerance 06/24/2021   Left knee pain 07/23/2020   Hip pain 01/26/2020   Spinal stenosis of lumbar region 01/16/2020   Chronic lumbar radiculopathy 12/08/2019   Hyperlipidemia associated with type 2 diabetes mellitus ([place]) 12/08/2019   Dizziness 11/03/2019   Paronychia of left index finger 05/19/2019   Fall  at home 11/24/2018   Rosacea 10/24/2018   Loss of weight 09/09/2018   Schizoaffective disorder, depressive type ([place]) 04/08/2018   Varicose veins of bilateral lower extremities with other complications 49/17/9150   Erectile dysfunction 04/26/2015   Hypothyroidism    Seizures ([place])    Persistent atrial fibrillation ([facility])    BMI 37.0-37.9, adult    Seborrheic dermatitis 02/10/2007   T2DM (type 2 diabetes mellitus) ([place]) 02/10/2007    PCP: Rise Patience, MD  REFERRING PROVIDER: Rise Patience, MD  REFERRING DIAG:  W19.XXXA,Y92.009 (ICD-10-CM) - Fall in home, initial encounter  M16.0 (ICD-10-CM) - Bilateral primary osteoarthritis of hip    Rationale for Evaluation and Treatment Rehabilitation  THERAPY DIAG:  Muscle weakness (generalized)  Other abnormalities of gait and mobility  Pain in left hip  Pain in right hip  Other low back pain  Chronic pain of left knee  Chronic bilateral low back pain with right-sided sciatica  Chronic pain of right knee  ONSET DATE: fall 1 month ago.  Chronic lumbar and LE pain  SUBJECTIVE:  SUBJECTIVE STATEMENT: Bad pain all weekend. I did wake up this AM and felt good until I had to bend over and pick up my glasses. Then my back hurt.   PERTINENT HISTORY:  A-fib, chronic LBP, depression, schizophrenia, tremors from medication  PAIN:  Are you having pain? Yes: NPRS scale: 3/10 Pain location: Lower left back Pain description: stiff, sore Aggravating factors: sitting/driving, walking  Relieving factors: stretching, rest, heating pad   Walking limited to 10 minutes-baseline for this patient   PRECAUTIONS: Fall  WEIGHT BEARING RESTRICTIONS No  FALLS:  Has patient fallen in last 6 months? Yes. Number of falls 1, fell when descending steps.  PT will  address balance in treatment.    LIVING ENVIRONMENT: Lives with: lives alone Lives in: House/apartment Stairs: Yes: External: 12-13  Has following equipment at home: None  OCCUPATION: on disability  PLOF: Independent with basic ADLs  PATIENT GOALS reduce Rt LE pain, improve balance and mobility    OBJECTIVE:   DIAGNOSTIC FINDINGS:  X-ray of hips and low back on 02/09/22: DDD in lumbar spine, otherwise unremarkable   COGNITION:  Overall cognitive status: History of cognitive impairments - at baseline     SENSATION: WFL  MUSCLE LENGTH: Limited by 50% bil.   POSTURE: rounded shoulders, forward head, and flexed trunk   PALPATION: Mild palpable tenderness and trigger points in Rt gluteals and Rt>Lt lumbar spine   LUMBAR ROM:  WFLs, baseline for this patient. Rt lumbar pain reported with A/ROM  LOWER EXTREMITY ROM:    IR limited by 50% bil   LOWER EXTREMITY MMT:   Rt hip 4/5, Lt hip 4+/5, Bil knees 4+/5 with pain reported with testing.   FUNCTIONAL TESTS:  5 times sit to stand: 20 seconds  LEFS: 48.75%   GAIT: Distance walked: 100 Assistive device utilized: None Level of assistance: Complete Independence Comments: reduced trunk rotation, flexed trunk, ER at bil hips  TODAY/'S TREATMENT    03/23/22: Nustep L2 14:39 min: pt goal of  1/2 mile with PTA present to discuss status post last visit.  Bil leg press: 60# 10x, hips in ER Seated forward flexion stretch with blue ball 6x Supine SKC 3x Bil, long leg stretch Bil 3x, lower trunk rotation 10x side to side.  Sidelying Addaday low back, mid back and RT hip     03/20/22: NuStep-Level 1x 8 minutes (seat 12, arms 12)-PTA present to discuss progress Manual therapy: Addaday to Rt hip and lumbar spine Supine lower trunk rotation/A/ROM/gentle rocking 2x10 Single leg leg lengthener stretch with ankle dorsiflexion  3x bil  Sit to stand 5x 50% UE/50% LE    Date: 03/18/22 NuStep-Level 1x 6 minutes (seat 12, arms 12)-PT  present to discuss progress Long arc quads: 5" hold 2x10 Seated marching: 2x10 Heel/toe raises in sitting 2x10 Standing heel raises: 2x10 Standing hip abduction 2x10 Manual therapy: Addaday to Rt hip and lumbar spine  Date: 03/04/22 HEP established-see below   PATIENT EDUCATION:  Education details: Access Code: [email or web address] Person educated: Patient Education method: Explanation, Demonstration, and Handouts Education comprehension: verbalized understanding and returned demonstration   HOME EXERCISE PROGRAM: Access Code: [email or web address] URL: [email or web address] Date: 03/04/2022 Prepared by: Claiborne Billings  Exercises - Seated Long Arc Quad  - 3 x daily - 7 x weekly - 2 sets - 10 reps - 5 hold - Seated March   - 3 x daily - 7 x weekly - 3 sets - 10 reps - Seated Heel Toe Raises   - 3 x daily -  7 x weekly - 2 sets - 10 reps - Sit to Stand Without Arm Support  - 2 x daily - 7 x weekly - 1 sets - 10 reps - Seated Hamstring Stretch  - 3 x daily - 7 x weekly - 1 sets - 3 reps - 20 hold  ASSESSMENT:  CLINICAL IMPRESSION:  Pt had increased back pain over the weekend. Pt reports compliance doing bed stretches when he gets up in the AM. Pt has increased pain with bed mobility and sit to supine and supine to sit.   OBJECTIVE IMPAIRMENTS Abnormal gait, decreased activity tolerance, decreased mobility, difficulty walking, decreased strength, increased muscle spasms, impaired flexibility, improper body mechanics, postural dysfunction, and pain.   ACTIVITY LIMITATIONS lifting, sitting, standing, squatting, stairs, dressing, and locomotion level  PARTICIPATION LIMITATIONS: meal prep, cleaning, laundry, and community activity  PERSONAL FACTORS Fitness, Past/current experiences, and 1-2 comorbidities: depression, chronic pain  are also affecting patient's functional outcome.   REHAB POTENTIAL: Good  CLINICAL DECISION MAKING: Evolving/moderate complexity  EVALUATION COMPLEXITY:  Moderate   GOALS: Goals reviewed with patient? Yes  SHORT TERM GOALS: Target date: 04/01/2022  Be independent in initial HEP Baseline: Goal status: INITIAL  2.  Improve LEFS to > or = to 60% to improve function Baseline: 45% Goal status: INITIAL  3.  Report > or = to 30% reduction in LBP and Rt LE pain with ADLs and self-care  Baseline:  Goal status: INITIAL    LONG TERM GOALS: Target date: 04/29/2022  Be independent in advanced HEP Baseline:  Goal status: INITIAL  2.  Improve LEFS to > or = to 70% to improve function Baseline: 45% Goal status: INITIAL  3.  Perform 5x sit to stand in < or = to 14 seconds to reduce falls risk  Baseline: 20 seconds  Goal status: INITIAL  4.  Report > or = to 60% reduction in LBP and Rt LE pain with ADLs and self-care Baseline:  Goal status: INITIAL    PLAN: PT FREQUENCY: 1-2x/week  PT DURATION: 8 weeks  PLANNED INTERVENTIONS: Therapeutic exercises, Therapeutic activity, Neuromuscular re-education, Balance training, Gait training, Patient/Family education, Self Care, Joint mobilization, Stair training, Aquatic Therapy, Dry Needling, Electrical stimulation, Spinal manipulation, Spinal mobilization, Cryotherapy, Moist heat, Taping, Ultrasound, Manual therapy, and Re-evaluation.  PLAN FOR NEXT SESSION: addaday to low back and Rt hip, see if pt doing bed stretches (leg lengthener and lower trunk rotation.)   Myrene Galas, PTA 03/23/22 2:46 PM   [facility] [address] [place], [place] [address] Phone # [phone] Fax [phone]

## 2022-03-25 ENCOUNTER — Other Ambulatory Visit (HOSPITAL_COMMUNITY): Payer: Self-pay | Admitting: Psychiatry

## 2022-03-25 DIAGNOSIS — F419 Anxiety disorder, unspecified: Secondary | ICD-10-CM

## 2022-03-25 DIAGNOSIS — F339 Major depressive disorder, recurrent, unspecified: Secondary | ICD-10-CM

## 2022-03-26 ENCOUNTER — Telehealth: Payer: Self-pay

## 2022-03-26 ENCOUNTER — Ambulatory Visit: Payer: Medicare HMO

## 2022-03-26 DIAGNOSIS — G8929 Other chronic pain: Secondary | ICD-10-CM | POA: Diagnosis not present

## 2022-03-26 DIAGNOSIS — R2689 Other abnormalities of gait and mobility: Secondary | ICD-10-CM

## 2022-03-26 DIAGNOSIS — M25562 Pain in left knee: Secondary | ICD-10-CM | POA: Diagnosis not present

## 2022-03-26 DIAGNOSIS — M25551 Pain in right hip: Secondary | ICD-10-CM

## 2022-03-26 DIAGNOSIS — M6281 Muscle weakness (generalized): Secondary | ICD-10-CM | POA: Diagnosis not present

## 2022-03-26 DIAGNOSIS — M5459 Other low back pain: Secondary | ICD-10-CM | POA: Diagnosis not present

## 2022-03-26 DIAGNOSIS — M25552 Pain in left hip: Secondary | ICD-10-CM

## 2022-03-26 DIAGNOSIS — M5441 Lumbago with sciatica, right side: Secondary | ICD-10-CM | POA: Diagnosis not present

## 2022-03-26 DIAGNOSIS — M25561 Pain in right knee: Secondary | ICD-10-CM | POA: Diagnosis not present

## 2022-03-26 NOTE — Therapy (Signed)
OUTPATIENT PHYSICAL THERAPY THORACOLUMBAR EVALUATION   Patient Name: [name] MRN: [id number] DOB:1960-07-16, 61 y.o., male Today's Date: 03/26/2022   PT End of Session - 03/26/22 1229     Visit Number 5    Date for PT Re-Evaluation 04/29/22    Authorization Type [employer]    Progress Note Due on Visit 10    PT Start Time 1148    PT Stop Time 1230    PT Time Calculation (min) 42 min    Activity Tolerance Patient tolerated treatment well    Behavior During Therapy WFL for tasks assessed/performed                Past Medical History:  Diagnosis Date   A-fib ([place])    Acute right-sided low back pain    Arrhythmia    Chronic a-fib ([facility])    Closed fracture of left distal radius    Contact dermatitis 04/16/2017   Depression    Depression    Hypothyroidism    Low back pain    Morbid obesity, BMI unknown ([place])    Osteoarthritis    oa in bilateral knees   Schizophrenia ([place])    Seizures ([place])    Thyroid disease    Varicose veins    Past Surgical History:  Procedure Laterality Date   ACHILLES TENDON REPAIR  approx. 2004   right foot   CATARACT EXTRACTION     bilateral   OPEN REDUCTION INTERNAL FIXATION (ORIF) DISTAL RADIAL FRACTURE Left 07/21/2018   Procedure: OPEN REDUCTION INTERNAL FIXATION (ORIF) DISTAL RADIAL FRACTURE;  Surgeon: [name], MD;  Location: [facility];  Service: Orthopedics;  Laterality: Left;   stab phlebectomy  Right 11/09/2016   stab phlebectomy R leg by Tinnie Gens MD   TOOTH EXTRACTION     Patient Active Problem List   Diagnosis Date Noted   Bilateral primary osteoarthritis of hip 08/13/2021   Statin intolerance 06/24/2021   Left knee pain 07/23/2020   Hip pain 01/26/2020   Spinal stenosis of lumbar region 01/16/2020   Chronic lumbar radiculopathy 12/08/2019   Hyperlipidemia associated with type 2 diabetes mellitus ([place]) 12/08/2019   Dizziness 11/03/2019   Paronychia of left index finger 05/19/2019   Fall  at home 11/24/2018   Rosacea 10/24/2018   Loss of weight 09/09/2018   Schizoaffective disorder, depressive type ([place]) 04/08/2018   Varicose veins of bilateral lower extremities with other complications 35/36/1443   Erectile dysfunction 04/26/2015   Hypothyroidism    Seizures ([place])    Persistent atrial fibrillation ([facility])    BMI 37.0-37.9, adult    Seborrheic dermatitis 02/10/2007   T2DM (type 2 diabetes mellitus) ([place]) 02/10/2007    PCP: Rise Patience, MD  REFERRING PROVIDER: Rise Patience, MD  REFERRING DIAG:  W19.XXXA,Y92.009 (ICD-10-CM) - Fall in home, initial encounter  M16.0 (ICD-10-CM) - Bilateral primary osteoarthritis of hip    Rationale for Evaluation and Treatment Rehabilitation  THERAPY DIAG:  Muscle weakness (generalized)  Other abnormalities of gait and mobility  Pain in left hip  Pain in right hip  ONSET DATE: fall 1 month ago.  Chronic lumbar and LE pain  SUBJECTIVE:  SUBJECTIVE STATEMENT: I'm doing terrible.  Wilburn Mylar was murder.  I used the heating pad for 1 hour, I also cleaned my apartment and went to a pizza social at my apartment. I feel like things are worse since I started PT.    PERTINENT HISTORY:  A-fib, chronic LBP, depression, schizophrenia, tremors from medication  PAIN:  Are you having pain? Yes: NPRS scale: 10/10 Pain location: Lower left back Pain description: stiff, sore Aggravating factors: sitting/driving, walking  Relieving factors: stretching, rest, heating pad   Walking limited to 10 minutes-baseline for this patient   PRECAUTIONS: Fall  WEIGHT BEARING RESTRICTIONS No  FALLS:  Has patient fallen in last 6 months? Yes. Number of falls 1, fell when descending steps.  PT will address balance in treatment.    LIVING ENVIRONMENT: Lives with:  lives alone Lives in: House/apartment Stairs: Yes: External: 12-13  Has following equipment at home: None  OCCUPATION: on disability  PLOF: Independent with basic ADLs  PATIENT GOALS reduce Rt LE pain, improve balance and mobility  OBJECTIVE:   DIAGNOSTIC FINDINGS:  X-ray of hips and low back on 02/09/22: DDD in lumbar spine, otherwise unremarkable   COGNITION:  Overall cognitive status: History of cognitive impairments - at baseline     SENSATION: WFL  MUSCLE LENGTH: Limited by 50% bil.   POSTURE: rounded shoulders, forward head, and flexed trunk   PALPATION: Mild palpable tenderness and trigger points in Rt gluteals and Rt>Lt lumbar spine   LUMBAR ROM:  WFLs, baseline for this patient. Rt lumbar pain reported with A/ROM  LOWER EXTREMITY ROM:    IR limited by 50% bil   LOWER EXTREMITY MMT:   Rt hip 4/5, Lt hip 4+/5, Bil knees 4+/5 with pain reported with testing.  FUNCTIONAL TESTS:  5 times sit to stand: 20 seconds  LEFS: 48.75%   GAIT: Distance walked: 100 Assistive device utilized: None Level of assistance: Complete Independence Comments: reduced trunk rotation, flexed trunk, ER at bil hips  TODAY/'S TREATMENT  03/25/22: Nustep L2 14:20 min: pt goal of  1/2 mile with PT present to discuss status post last visit.  Supine ball squeeze with TA activation 2x10 Supine clams with blue loop band 2x5 Seated forward flexion stretch with blue ball 10x Supine SKC 3x Bil. lower trunk rotation 10x side to side.  Sidelying Addaday low back, mid back and Rt hip and Lt hip in sidelying.     03/23/22: Nustep L2 14:39 min: pt goal of  1/2 mile with PTA present to discuss status post last visit.  Bil leg press: 60# 10x, hips in ER Seated forward flexion stretch with blue ball 6x Supine SKC 3x Bil, long leg stretch Bil 3x, lower trunk rotation 10x side to side.  Sidelying Addaday low back, mid back and Rt hip     03/20/22: NuStep-Level 1x 8 minutes (seat 12, arms 12)-PTA  present to discuss progress Manual therapy: Addaday to Rt hip and lumbar spine Supine lower trunk rotation/A/ROM/gentle rocking 2x10 Single leg leg lengthener stretch with ankle dorsiflexion  3x bil  Sit to stand 5x 50% UE/50% LE     PATIENT EDUCATION:  Education details: Access Code: [email or web address] Person educated: Patient Education method: Explanation, Demonstration, and Handouts Education comprehension: verbalized understanding and returned demonstration   HOME EXERCISE PROGRAM: Access Code: [email or web address] URL: [email or web address] Date: 03/04/2022 Prepared by: Claiborne Billings  Exercises - Seated Long Arc Quad  - 3 x daily - 7 x weekly - 2 sets - 10 reps - 5 hold -  Seated March   - 3 x daily - 7 x weekly - 3 sets - 10 reps - Seated Heel Toe Raises   - 3 x daily - 7 x weekly - 2 sets - 10 reps - Sit to Stand Without Arm Support  - 2 x daily - 7 x weekly - 1 sets - 10 reps - Seated Hamstring Stretch  - 3 x daily - 7 x weekly - 1 sets - 3 reps - 20 hold  ASSESSMENT:  CLINICAL IMPRESSION: Pt arrived with report of 10/10 pain today and that he feels like they missed something when they saw him at the MD.  Pt is using the heating pad consistently.  Pt is able to participate in gentle exercise today in the clinic and NuStep x14 minutes.  PT encouraged pt to contact the MD due to high pain levels with gentle mobility. PT monitored all exercise for pain and technique.  Patient will benefit from skilled PT to address the below impairments and improve overall function.   OBJECTIVE IMPAIRMENTS Abnormal gait, decreased activity tolerance, decreased mobility, difficulty walking, decreased strength, increased muscle spasms, impaired flexibility, improper body mechanics, postural dysfunction, and pain.   ACTIVITY LIMITATIONS lifting, sitting, standing, squatting, stairs, dressing, and locomotion level  PARTICIPATION LIMITATIONS: meal prep, cleaning, laundry, and community activity  PERSONAL FACTORS  Fitness, Past/current experiences, and 1-2 comorbidities: depression, chronic pain  are also affecting patient's functional outcome.   REHAB POTENTIAL: Good  CLINICAL DECISION MAKING: Evolving/moderate complexity  EVALUATION COMPLEXITY: Moderate   GOALS: Goals reviewed with patient? Yes  SHORT TERM GOALS: Target date: 04/01/2022  Be independent in initial HEP Baseline: Goal status: INITIAL  2.  Improve LEFS to > or = to 60% to improve function Baseline: 45% Goal status: INITIAL  3.  Report > or = to 30% reduction in LBP and Rt LE pain with ADLs and self-care  Baseline:  Goal status: INITIAL    LONG TERM GOALS: Target date: 04/29/2022  Be independent in advanced HEP Baseline:  Goal status: INITIAL  2.  Improve LEFS to > or = to 70% to improve function Baseline: 45% Goal status: INITIAL  3.  Perform 5x sit to stand in < or = to 14 seconds to reduce falls risk  Baseline: 20 seconds  Goal status: INITIAL  4.  Report > or = to 60% reduction in LBP and Rt LE pain with ADLs and self-care Baseline:  Goal status: INITIAL    PLAN: PT FREQUENCY: 1-2x/week  PT DURATION: 8 weeks  PLANNED INTERVENTIONS: Therapeutic exercises, Therapeutic activity, Neuromuscular re-education, Balance training, Gait training, Patient/Family education, Self Care, Joint mobilization, Stair training, Aquatic Therapy, Dry Needling, Electrical stimulation, Spinal manipulation, Spinal mobilization, Cryotherapy, Moist heat, Taping, Ultrasound, Manual therapy, and Re-evaluation.  PLAN FOR NEXT SESSION: addaday to low back and Rt hip, see if pt doing bed stretches (leg lengthener and lower trunk rotation.)   Sigurd Sos, PT 03/26/22 12:31 PM   [facility] [address], [place], [place] [address] Phone # [phone] Fax [phone]

## 2022-03-26 NOTE — Telephone Encounter (Signed)
Patient calls nurse line reporting continued back pain post fall.   Patient reports he fell several weeks ago and the pain has progressively gotten worse.   Patient reports starting PT has not been helpful. Reports PT is causing more pain. Reports he can not use the lidocaine patches due to the location, reports he can not reach. Patient reports he has been using Tylenol, however reports very minimal relief.   Patient is requesting something stronger.   Patient scheduled for evaluation next week.

## 2022-03-30 ENCOUNTER — Ambulatory Visit: Payer: Medicare HMO

## 2022-03-30 DIAGNOSIS — M25551 Pain in right hip: Secondary | ICD-10-CM | POA: Diagnosis not present

## 2022-03-30 DIAGNOSIS — M25552 Pain in left hip: Secondary | ICD-10-CM | POA: Diagnosis not present

## 2022-03-30 DIAGNOSIS — R2689 Other abnormalities of gait and mobility: Secondary | ICD-10-CM | POA: Diagnosis not present

## 2022-03-30 DIAGNOSIS — M6281 Muscle weakness (generalized): Secondary | ICD-10-CM | POA: Diagnosis not present

## 2022-03-30 DIAGNOSIS — G8929 Other chronic pain: Secondary | ICD-10-CM | POA: Diagnosis not present

## 2022-03-30 DIAGNOSIS — M25561 Pain in right knee: Secondary | ICD-10-CM | POA: Diagnosis not present

## 2022-03-30 DIAGNOSIS — M5441 Lumbago with sciatica, right side: Secondary | ICD-10-CM | POA: Diagnosis not present

## 2022-03-30 DIAGNOSIS — M25562 Pain in left knee: Secondary | ICD-10-CM | POA: Diagnosis not present

## 2022-03-30 DIAGNOSIS — M5459 Other low back pain: Secondary | ICD-10-CM | POA: Diagnosis not present

## 2022-03-30 NOTE — Therapy (Signed)
OUTPATIENT PHYSICAL THERAPY THORACOLUMBAR EVALUATION   Patient Name: [name] MRN: [id number] DOB:10-Mar-1961, 61 y.o., male Today's Date: 03/30/2022   PT End of Session - 03/30/22 1444     Visit Number 6    Date for PT Re-Evaluation 04/29/22    Authorization Type [employer] Medicare    Progress Note Due on Visit 10    PT Start Time 1401    PT Stop Time 1444    PT Time Calculation (min) 43 min    Activity Tolerance Patient tolerated treatment well    Behavior During Therapy WFL for tasks assessed/performed                 Past Medical History:  Diagnosis Date   A-fib ([place])    Acute right-sided low back pain    Arrhythmia    Chronic a-fib ([facility])    Closed fracture of left distal radius    Contact dermatitis 04/16/2017   Depression    Depression    Hypothyroidism    Low back pain    Morbid obesity, BMI unknown ([place])    Osteoarthritis    oa in bilateral knees   Schizophrenia ([place])    Seizures ([place])    Thyroid disease    Varicose veins    Past Surgical History:  Procedure Laterality Date   ACHILLES TENDON REPAIR  approx. 2004   right foot   CATARACT EXTRACTION     bilateral   OPEN REDUCTION INTERNAL FIXATION (ORIF) DISTAL RADIAL FRACTURE Left 07/21/2018   Procedure: OPEN REDUCTION INTERNAL FIXATION (ORIF) DISTAL RADIAL FRACTURE;  Surgeon: [name], MD;  Location: [facility];  Service: Orthopedics;  Laterality: Left;   stab phlebectomy  Right 11/09/2016   stab phlebectomy R leg by Tinnie Gens MD   TOOTH EXTRACTION     Patient Active Problem List   Diagnosis Date Noted   Bilateral primary osteoarthritis of hip 08/13/2021   Statin intolerance 06/24/2021   Left knee pain 07/23/2020   Hip pain 01/26/2020   Spinal stenosis of lumbar region 01/16/2020   Chronic lumbar radiculopathy 12/08/2019   Hyperlipidemia associated with type 2 diabetes mellitus ([place]) 12/08/2019   Dizziness 11/03/2019   Paronychia of left index finger 05/19/2019    Fall at home 11/24/2018   Rosacea 10/24/2018   Loss of weight 09/09/2018   Schizoaffective disorder, depressive type ([place]) 04/08/2018   Varicose veins of bilateral lower extremities with other complications 87/56/4332   Erectile dysfunction 04/26/2015   Hypothyroidism    Seizures ([place])    Persistent atrial fibrillation ([facility])    BMI 37.0-37.9, adult    Seborrheic dermatitis 02/10/2007   T2DM (type 2 diabetes mellitus) ([place]) 02/10/2007    PCP: Rise Patience, MD  REFERRING PROVIDER: Rise Patience, MD  REFERRING DIAG:  W19.XXXA,Y92.009 (ICD-10-CM) - Fall in home, initial encounter  M16.0 (ICD-10-CM) - Bilateral primary osteoarthritis of hip    Rationale for Evaluation and Treatment Rehabilitation  THERAPY DIAG:  Other abnormalities of gait and mobility  Muscle weakness (generalized)  Pain in left hip  Pain in right hip  ONSET DATE: fall 1 month ago.  Chronic lumbar and LE pain  SUBJECTIVE:  SUBJECTIVE STATEMENT: My back locked up on me on Saturday.  I couldn't walk.   PERTINENT HISTORY:  A-fib, chronic LBP, depression, schizophrenia, tremors from medication  PAIN:  Are you having pain? Yes: NPRS scale: 8/10 Pain location: Lower left back Pain description: stiff, sore Aggravating factors: sitting/driving, walking  Relieving factors: stretching, rest, heating pad   Walking limited to 10 minutes-baseline for this patient   PRECAUTIONS: Fall  WEIGHT BEARING RESTRICTIONS No  FALLS:  Has patient fallen in last 6 months? Yes. Number of falls 1, fell when descending steps.  PT will address balance in treatment.    LIVING ENVIRONMENT: Lives with: lives alone Lives in: House/apartment Stairs: Yes: External: 12-13  Has following equipment at home: None  OCCUPATION: on  disability  PLOF: Independent with basic ADLs  PATIENT GOALS reduce Rt LE pain, improve balance and mobility  OBJECTIVE:   DIAGNOSTIC FINDINGS:  X-ray of hips and low back on 02/09/22: DDD in lumbar spine, otherwise unremarkable   COGNITION:  Overall cognitive status: History of cognitive impairments - at baseline     SENSATION: WFL  MUSCLE LENGTH: Limited by 50% bil.   POSTURE: rounded shoulders, forward head, and flexed trunk   PALPATION: Mild palpable tenderness and trigger points in Rt gluteals and Rt>Lt lumbar spine   LUMBAR ROM:  WFLs, baseline for this patient. Rt lumbar pain reported with A/ROM  LOWER EXTREMITY ROM:    IR limited by 50% bil   LOWER EXTREMITY MMT:   Rt hip 4/5, Lt hip 4+/5, Bil knees 4+/5 with pain reported with testing.  FUNCTIONAL TESTS:  5 times sit to stand: 20 seconds  LEFS: 48.75%   GAIT: Distance walked: 100 Assistive device utilized: None Level of assistance: Complete Independence Comments: reduced trunk rotation, flexed trunk, ER at bil hips  TODAY/'S TREATMENT  03/30/22: Nustep L2 14:20 min: pt goal of  1/2 mile with PT present to discuss status post last visit.  Seated shoulder 3 ways: 1# 2x5 bil Press into foam roll 5" hold x 10-verbal cues to avoid leaning forward Seated forward flexion stretch with blue ball 10x and lateral x 5 each Supine SKC 3x Bil. lower trunk rotation 10x side to side.  Sidelying Addaday low back, mid back and Rt hip and Lt hip in sidelying.   03/25/22: Nustep L2 14:20 min: pt goal of  1/2 mile with PT present to discuss status post last visit.  Supine ball squeeze with TA activation 2x10 Supine clams with blue loop band 2x5 Seated forward flexion stretch with blue ball 10x Supine SKC 3x Bil. lower trunk rotation 10x side to side.  Sidelying Addaday low back, mid back and Rt hip and Lt hip in sidelying.     03/23/22: Nustep L2 14:39 min: pt goal of  1/2 mile with PTA present to discuss status post last  visit.  Bil leg press: 60# 10x, hips in ER Seated forward flexion stretch with blue ball 6x Supine SKC 3x Bil, long leg stretch Bil 3x, lower trunk rotation 10x side to side.  Sidelying Addaday low back, mid back and Rt hip     PATIENT EDUCATION:  Education details: Access Code: [email or web address] Person educated: Patient Education method: Explanation, Demonstration, and Handouts Education comprehension: verbalized understanding and returned demonstration   HOME EXERCISE PROGRAM: Access Code: [email or web address] URL: [email or web address] Date: 03/04/2022 Prepared by: Claiborne Billings  Exercises - Seated Long Arc Quad  - 3 x daily - 7 x weekly - 2 sets - 10 reps - 5  hold - Seated March   - 3 x daily - 7 x weekly - 3 sets - 10 reps - Seated Heel Toe Raises   - 3 x daily - 7 x weekly - 2 sets - 10 reps - Sit to Stand Without Arm Support  - 2 x daily - 7 x weekly - 1 sets - 10 reps - Seated Hamstring Stretch  - 3 x daily - 7 x weekly - 1 sets - 3 reps - 20 hold  ASSESSMENT:  CLINICAL IMPRESSION: Pt arrived with report that his back locked up and he was not able to walk.  Pt is able to participate in all gentle PT activities today including > 10 min on the NuStep. PT monitored all exercise for pain and technique.  PT encouraged pt to continue to exercise to maintain mobility. Patient will benefit from skilled PT to address the below impairments and improve overall function.   OBJECTIVE IMPAIRMENTS Abnormal gait, decreased activity tolerance, decreased mobility, difficulty walking, decreased strength, increased muscle spasms, impaired flexibility, improper body mechanics, postural dysfunction, and pain.   ACTIVITY LIMITATIONS lifting, sitting, standing, squatting, stairs, dressing, and locomotion level  PARTICIPATION LIMITATIONS: meal prep, cleaning, laundry, and community activity  PERSONAL FACTORS Fitness, Past/current experiences, and 1-2 comorbidities: depression, chronic pain  are also affecting  patient's functional outcome.   REHAB POTENTIAL: Good  CLINICAL DECISION MAKING: Evolving/moderate complexity  EVALUATION COMPLEXITY: Moderate   GOALS: Goals reviewed with patient? Yes  SHORT TERM GOALS: Target date: 04/01/2022  Be independent in initial HEP Baseline: Goal status: met  2.  Improve LEFS to > or = to 60% to improve function Baseline: 45% Goal status: INITIAL  3.  Report > or = to 30% reduction in LBP and Rt LE pain with ADLs and self-care  Baseline: no change per pt report Goal status: met    LONG TERM GOALS: Target date: 04/29/2022  Be independent in advanced HEP Baseline:  Goal status: INITIAL  2.  Improve LEFS to > or = to 70% to improve function Baseline: 45% Goal status: INITIAL  3.  Perform 5x sit to stand in < or = to 14 seconds to reduce falls risk  Baseline: 20 seconds  Goal status: INITIAL  4.  Report > or = to 60% reduction in LBP and Rt LE pain with ADLs and self-care Baseline:  Goal status: INITIAL    PLAN: PT FREQUENCY: 1-2x/week  PT DURATION: 8 weeks  PLANNED INTERVENTIONS: Therapeutic exercises, Therapeutic activity, Neuromuscular re-education, Balance training, Gait training, Patient/Family education, Self Care, Joint mobilization, Stair training, Aquatic Therapy, Dry Needling, Electrical stimulation, Spinal manipulation, Spinal mobilization, Cryotherapy, Moist heat, Taping, Ultrasound, Manual therapy, and Re-evaluation.  PLAN FOR NEXT SESSION: addaday to low back and Rt hip, encourage mobility   Sigurd Sos, [place] 03/30/22 2:45 PM   [facility] [address] [place] [address] [place], [place] [address] Phone # [phone] Fax [phone]

## 2022-04-01 ENCOUNTER — Other Ambulatory Visit: Payer: Self-pay | Admitting: Family Medicine

## 2022-04-01 DIAGNOSIS — M109 Gout, unspecified: Secondary | ICD-10-CM

## 2022-04-02 ENCOUNTER — Ambulatory Visit: Payer: Medicare HMO

## 2022-04-02 ENCOUNTER — Encounter: Payer: Self-pay | Admitting: Student

## 2022-04-02 ENCOUNTER — Ambulatory Visit (INDEPENDENT_AMBULATORY_CARE_PROVIDER_SITE_OTHER): Payer: Medicare HMO | Admitting: Student

## 2022-04-02 VITALS — BP 126/76 | HR 70 | Ht 70.0 in | Wt 257.2 lb

## 2022-04-02 DIAGNOSIS — M545 Low back pain, unspecified: Secondary | ICD-10-CM

## 2022-04-02 DIAGNOSIS — D485 Neoplasm of uncertain behavior of skin: Secondary | ICD-10-CM | POA: Diagnosis not present

## 2022-04-02 DIAGNOSIS — M6281 Muscle weakness (generalized): Secondary | ICD-10-CM

## 2022-04-02 DIAGNOSIS — M25551 Pain in right hip: Secondary | ICD-10-CM

## 2022-04-02 DIAGNOSIS — L718 Other rosacea: Secondary | ICD-10-CM | POA: Diagnosis not present

## 2022-04-02 DIAGNOSIS — C44319 Basal cell carcinoma of skin of other parts of face: Secondary | ICD-10-CM | POA: Diagnosis not present

## 2022-04-02 DIAGNOSIS — M25552 Pain in left hip: Secondary | ICD-10-CM | POA: Diagnosis not present

## 2022-04-02 DIAGNOSIS — M25561 Pain in right knee: Secondary | ICD-10-CM | POA: Diagnosis not present

## 2022-04-02 DIAGNOSIS — R2689 Other abnormalities of gait and mobility: Secondary | ICD-10-CM | POA: Diagnosis not present

## 2022-04-02 DIAGNOSIS — M5459 Other low back pain: Secondary | ICD-10-CM | POA: Diagnosis not present

## 2022-04-02 DIAGNOSIS — M25562 Pain in left knee: Secondary | ICD-10-CM | POA: Diagnosis not present

## 2022-04-02 DIAGNOSIS — G8929 Other chronic pain: Secondary | ICD-10-CM | POA: Diagnosis not present

## 2022-04-02 DIAGNOSIS — M5441 Lumbago with sciatica, right side: Secondary | ICD-10-CM | POA: Diagnosis not present

## 2022-04-02 MED ORDER — HYDROCODONE-ACETAMINOPHEN 5-325 MG PO TABS: 1.0000 | ORAL_TABLET | Freq: Four times a day (QID) | ORAL | 0 refills | Status: DC | PRN

## 2022-04-02 NOTE — Therapy (Signed)
OUTPATIENT PHYSICAL THERAPY THORACOLUMBAR EVALUATION   Patient Name: [name] MRN: [id number] DOB:[birth year], 61 y.o., male Today's Date: 04/02/2022   PT End of Session - 04/02/22 1144     Visit Number 7    Date for PT Re-Evaluation 04/29/22    Authorization Type [employer]    Progress Note Due on Visit 10    PT Start Time 1101    PT Stop Time 1144    PT Time Calculation (min) 43 min    Activity Tolerance Patient tolerated treatment well    Behavior During Therapy WFL for tasks assessed/performed                  Past Medical History:  Diagnosis Date   A-fib ([place])    Acute right-sided low back pain    Arrhythmia    Chronic a-fib ([facility])    Closed fracture of left distal radius    Contact dermatitis 04/16/2017   Depression    Depression    Hypothyroidism    Low back pain    Morbid obesity, BMI unknown ([place])    Osteoarthritis    oa in bilateral knees   Schizophrenia ([place])    Seizures ([place])    Thyroid disease    Varicose veins    Past Surgical History:  Procedure Laterality Date   ACHILLES TENDON REPAIR  approx. 2004   right foot   CATARACT EXTRACTION     bilateral   OPEN REDUCTION INTERNAL FIXATION (ORIF) DISTAL RADIAL FRACTURE Left 07/21/2018   Procedure: OPEN REDUCTION INTERNAL FIXATION (ORIF) DISTAL RADIAL FRACTURE;  Surgeon: [name], MD;  Location: [facility];  Service: Orthopedics;  Laterality: Left;   stab phlebectomy  Right 11/09/2016   stab phlebectomy R leg by Tinnie Gens MD   TOOTH EXTRACTION     Patient Active Problem List   Diagnosis Date Noted   Bilateral primary osteoarthritis of hip 08/13/2021   Statin intolerance 06/24/2021   Left knee pain 07/23/2020   Hip pain 01/26/2020   Spinal stenosis of lumbar region 01/16/2020   Chronic lumbar radiculopathy 12/08/2019   Hyperlipidemia associated with type 2 diabetes mellitus ([place]) 12/08/2019   Dizziness 11/03/2019   Paronychia of left index finger 05/19/2019    Fall at home 11/24/2018   Rosacea 10/24/2018   Loss of weight 09/09/2018   Schizoaffective disorder, depressive type ([place]) 04/08/2018   Varicose veins of bilateral lower extremities with other complications 86/76/7209   Erectile dysfunction 04/26/2015   Hypothyroidism    Seizures ([facility])    Persistent atrial fibrillation ([facility])    BMI 37.0-37.9, adult    Acute bilateral low back pain without sciatica    Seborrheic dermatitis 02/10/2007   T2DM (type 2 diabetes mellitus) ([place]) 02/10/2007    PCP: Rise Patience, MD  REFERRING PROVIDER: Rise Patience, MD  REFERRING DIAG:  W19.XXXA,Y92.009 (ICD-10-CM) - Fall in home, initial encounter  M16.0 (ICD-10-CM) - Bilateral primary osteoarthritis of hip    Rationale for Evaluation and Treatment Rehabilitation  THERAPY DIAG:  Other abnormalities of gait and mobility  Muscle weakness (generalized)  Pain in left hip  Pain in right hip  Other low back pain  ONSET DATE: fall 1 month ago.  Chronic lumbar and LE pain  SUBJECTIVE:  SUBJECTIVE STATEMENT: I think I am feeling a little better.  I've been using heat a lot.    PERTINENT HISTORY:  A-fib, chronic LBP, depression, schizophrenia, tremors from medication  PAIN:  Are you having pain? Yes: NPRS scale: 7/10 Pain location: Lower left back Pain description: stiff, sore Aggravating factors: sitting/driving, walking  Relieving factors: stretching, rest, heating pad   Walking limited to 10 minutes-baseline for this patient   PRECAUTIONS: Fall  WEIGHT BEARING RESTRICTIONS No  FALLS:  Has patient fallen in last 6 months? Yes. Number of falls 1, fell when descending steps.  PT will address balance in treatment.    LIVING ENVIRONMENT: Lives with: lives alone Lives in: House/apartment Stairs: Yes:  External: 12-13  Has following equipment at home: None  OCCUPATION: on disability  PLOF: Independent with basic ADLs  PATIENT GOALS reduce Rt LE pain, improve balance and mobility  OBJECTIVE:   DIAGNOSTIC FINDINGS:  X-ray of hips and low back on 02/09/22: DDD in lumbar spine, otherwise unremarkable   COGNITION:  Overall cognitive status: History of cognitive impairments - at baseline     SENSATION: WFL  MUSCLE LENGTH: Limited by 50% bil.   POSTURE: rounded shoulders, forward head, and flexed trunk   PALPATION: Mild palpable tenderness and trigger points in Rt gluteals and Rt>Lt lumbar spine   LUMBAR ROM:  WFLs, baseline for this patient. Rt lumbar pain reported with A/ROM  LOWER EXTREMITY ROM:    IR limited by 50% bil   LOWER EXTREMITY MMT:   Rt hip 4/5, Lt hip 4+/5, Bil knees 4+/5 with pain reported with testing.  FUNCTIONAL TESTS:  5 times sit to stand: 20 seconds  LEFS: 48.75%   GAIT: Distance walked: 100 Assistive device utilized: None Level of assistance: Complete Independence Comments: reduced trunk rotation, flexed trunk, ER at bil hips  TODAY/'S TREATMENT  04/02/22: Nustep L2 14:20 min: pt goal of  1/2 mile with PT present to discuss status post last visit.  Seated shoulder 3 ways: 1# 2x5 bil Press into foam roll 5" hold x 10-verbal cues to avoid leaning forward Seated forward flexion stretch with blue ball 10x and lateral x 5 each Supine SKC 3x Bil. lower trunk rotation 10x side to side.  Sidelying Addaday low back, mid back and Rt hip and Lt hip in sidelying.   03/30/22: Nustep L2 14:20 min: pt goal of  1/2 mile with PT present to discuss status post last visit.  Seated shoulder 3 ways: 1# 2x5 bil Press into foam roll 5" hold x 10-verbal cues to avoid leaning forward Seated forward flexion stretch with blue ball 10x and lateral x 5 each Supine SKC 3x Bil. lower trunk rotation 10x side to side.  Sidelying Addaday low back, mid back and Rt hip and Lt  hip in sidelying.   03/25/22: Nustep L2 14:20 min: pt goal of  1/2 mile with PT present to discuss status post last visit.  Supine ball squeeze with TA activation 2x10 Supine clams with blue loop band 2x5 Seated forward flexion stretch with blue ball 10x Supine SKC 3x Bil. lower trunk rotation 10x side to side.  Sidelying Addaday low back, mid back and Rt hip and Lt hip in sidelying.       PATIENT EDUCATION:  Education details: Access Code: [email or web address] Person educated: Patient Education method: Explanation, Media planner, and Handouts Education comprehension: verbalized understanding and returned demonstration   HOME EXERCISE PROGRAM: Access Code: [email or web address] URL: [email or web address] Date: 03/04/2022 Prepared by: Claiborne Billings  Exercises -  Seated Long Arc Quad  - 3 x daily - 7 x weekly - 2 sets - 10 reps - 5 hold - Seated March   - 3 x daily - 7 x weekly - 3 sets - 10 reps - Seated Heel Toe Raises   - 3 x daily - 7 x weekly - 2 sets - 10 reps - Sit to Stand Without Arm Support  - 2 x daily - 7 x weekly - 1 sets - 10 reps - Seated Hamstring Stretch  - 3 x daily - 7 x weekly - 1 sets - 3 reps - 20 hold  ASSESSMENT:  CLINICAL IMPRESSION: Pt arrived with report that he is feeling better overall and denies any episodes of his back locking up.  Pt is able to participate in all gentle PT activities today including > 10 min on the NuStep.  Pt completed his 1/2 mile distance on the NuStep in 13 minutes today, this was 14 min, 20 seconds last session. PT monitored all exercise for pain and technique.  PT encouraged pt to continue to exercise to maintain mobility. Pt with chronic condition and multiple co morbidities that are impacting his progress. Patient will benefit from skilled PT to address the below impairments and improve overall function.   OBJECTIVE IMPAIRMENTS Abnormal gait, decreased activity tolerance, decreased mobility, difficulty walking, decreased strength, increased muscle  spasms, impaired flexibility, improper body mechanics, postural dysfunction, and pain.   ACTIVITY LIMITATIONS lifting, sitting, standing, squatting, stairs, dressing, and locomotion level  PARTICIPATION LIMITATIONS: meal prep, cleaning, laundry, and community activity  PERSONAL FACTORS Fitness, Past/current experiences, and 1-2 comorbidities: depression, chronic pain  are also affecting patient's functional outcome.   REHAB POTENTIAL: Good  CLINICAL DECISION MAKING: Evolving/moderate complexity  EVALUATION COMPLEXITY: Moderate   GOALS: Goals reviewed with patient? Yes  SHORT TERM GOALS: Target date: 04/01/2022  Be independent in initial HEP Baseline: Goal status: met  2.  Improve LEFS to > or = to 60% to improve function Baseline: 45% Goal status: INITIAL  3.  Report > or = to 30% reduction in LBP and Rt LE pain with ADLs and self-care  Baseline: some improvement today, not rated (04/02/22) Goal status: met    LONG TERM GOALS: Target date: 04/29/2022  Be independent in advanced HEP Baseline:  Goal status: INITIAL  2.  Improve LEFS to > or = to 70% to improve function Baseline: 45% Goal status: INITIAL  3.  Perform 5x sit to stand in < or = to 14 seconds to reduce falls risk  Baseline: 20 seconds  Goal status: INITIAL  4.  Report > or = to 60% reduction in LBP and Rt LE pain with ADLs and self-care Baseline:  Goal status: INITIAL    PLAN: PT FREQUENCY: 1-2x/week  PT DURATION: 8 weeks  PLANNED INTERVENTIONS: Therapeutic exercises, Therapeutic activity, Neuromuscular re-education, Balance training, Gait training, Patient/Family education, Self Care, Joint mobilization, Stair training, Aquatic Therapy, Dry Needling, Electrical stimulation, Spinal manipulation, Spinal mobilization, Cryotherapy, Moist heat, Taping, Ultrasound, Manual therapy, and Re-evaluation.  PLAN FOR NEXT SESSION: addaday to low back and Rt hip, encourage mobility   Sigurd Sos,  PT 04/02/22 11:46 AM   [facility] [address] [place], [place] [address] Phone # [phone] Fax [phone]

## 2022-04-02 NOTE — Assessment & Plan Note (Addendum)
Lasting ~2 months after fall.  Limiting his activity.  X-ray after incident was negative for fractures or other acute abnormalities.  Back pain could be related to his osteoarthritis of hip.  Conservative management has not helped nor has PT. patient endorses significant pain, and difficulty ambulating-we will consider pain management until he can see orthopedics.  - 7-day supply of Norco provided - Continue with PT - Follow-up with orthopedist

## 2022-04-02 NOTE — Assessment & Plan Note (Addendum)
Chronic OA, sees orthopedics.  Pain is worsening, last x-ray in August showed degenerative changes of hips bilaterally. - Follow-up with orthopedist

## 2022-04-02 NOTE — Progress Notes (Signed)
    SUBJECTIVE:   CHIEF COMPLAINT / HPI:   BACK PAIN  Back pain began 2 months ago. Pain is described as bilateral low back pain, left hip pain. Sharp pain. Patient has tried: Voltaren gel, tylenol, (avoids NSAIDS-not sure why). Heating pads. Pain radiates: NO.  History of trauma or injury: Fell 6 weeks ago. X-rays negative. Patient believes might be causing their pain: The fall, left hip.  Prior history of similar pain: No back pain prior to fall. Yes to hip pain. History of cancer: No Weak immune system: No History of IV drug use: No History of steroid use: No  Symptoms Incontinence of bowel or bladder: No Numbness of leg: right side leg numbness, but this is chronic Fever: no Rest or Night pain: Hard to sleep, doesn't wake him up Weight Loss:  No Rash: No  In PT right now: Is not helping pain. Doesn't believe this is helping. Next appointment today.   PERTINENT  PMH / PSH: Diabetes, hypothyroidism, schizoaffective disorder depressive type on MAOI therapy, OA   OBJECTIVE:   BP 126/76   Pulse 70   Ht '5\' 10"'$  (1.778 m)   Wt 257 lb 3.2 oz (116.7 kg)   SpO2 95%   BMI 36.90 kg/m    Cardio: Regular rate and rhythm by auscultation today. Respiratory: CTAB, regular work of breathing MSK: Patient able to ambulate from chair to exam table, however is slow with pain.  Tenderness to the paraspinal muscles bilaterally at L2-L5.  No point tenderness over spinous processes.  Straight leg positive on right, negative on left.  FADIR/FABER positive for intra articular pain bilaterally.  Pain in hip with internal and external rotation of hip.   ASSESSMENT/PLAN:   Acute bilateral low back pain without sciatica Lasting ~2 months after fall.  Limiting his activity.  X-ray after incident was negative for fractures or other acute abnormalities.  Back pain could be related to his osteoarthritis of hip.  Conservative management has not helped nor has PT. patient endorses significant pain,  and difficulty ambulating-we will consider pain management until he can see orthopedics. - 7-day supply of Norco provided - Continue with PT - Follow-up with orthopedist   Hip pain Chronic OA, sees orthopedics.  Pain is worsening, last x-ray in August showed degenerative changes of hips bilaterally. - Follow-up with orthopedist  At the end of our visit as patient was going to check out, he mentioned that he does remember that he had orthopedic appointment today.  I mentioned to wait to fill Norco until he has seen his orthopedic surgeon.   Leslie Dales, [place]

## 2022-04-02 NOTE — Patient Instructions (Signed)
It was great to see you! Thank you for allowing me to participate in your care!   I recommend that you always bring your medications to each appointment as this makes it easy to ensure we are on the correct medications and helps [place] not miss when refills are needed.  Our plans for today:  - Please take 1 Norco pill for moderate to severe pain. You can take this medication every 6 hours.  - Please call your Orthopedic doctor, and make an appointment. - We will also send a referral, just in case.  Take care and seek immediate care sooner if you develop any concerns. Please remember to show up 15 minutes before your scheduled appointment time!  Leslie Dales, DO [facility]

## 2022-04-06 ENCOUNTER — Ambulatory Visit: Payer: Medicare HMO | Attending: Family Medicine

## 2022-04-06 DIAGNOSIS — M25561 Pain in right knee: Secondary | ICD-10-CM | POA: Diagnosis not present

## 2022-04-06 DIAGNOSIS — M25552 Pain in left hip: Secondary | ICD-10-CM | POA: Insufficient documentation

## 2022-04-06 DIAGNOSIS — R2689 Other abnormalities of gait and mobility: Secondary | ICD-10-CM | POA: Diagnosis not present

## 2022-04-06 DIAGNOSIS — M6281 Muscle weakness (generalized): Secondary | ICD-10-CM | POA: Insufficient documentation

## 2022-04-06 DIAGNOSIS — M25562 Pain in left knee: Secondary | ICD-10-CM | POA: Insufficient documentation

## 2022-04-06 DIAGNOSIS — M5459 Other low back pain: Secondary | ICD-10-CM | POA: Insufficient documentation

## 2022-04-06 DIAGNOSIS — M25551 Pain in right hip: Secondary | ICD-10-CM | POA: Diagnosis not present

## 2022-04-06 DIAGNOSIS — G8929 Other chronic pain: Secondary | ICD-10-CM | POA: Insufficient documentation

## 2022-04-06 DIAGNOSIS — M5441 Lumbago with sciatica, right side: Secondary | ICD-10-CM | POA: Diagnosis not present

## 2022-04-06 NOTE — Therapy (Signed)
OUTPATIENT PHYSICAL THERAPY THORACOLUMBAR EVALUATION   Patient Name: [name] MRN: [id number] DOB:1961/03/10, 61 y.o., male Today's Date: 04/06/2022   PT End of Session - 04/06/22 1445     Visit Number 8    Date for PT Re-Evaluation 04/29/22    Authorization Type [employer]na Medicare    Progress Note Due on Visit 10    PT Start Time 1401    PT Stop Time 1445    PT Time Calculation (min) 44 min    Activity Tolerance Patient tolerated treatment well    Behavior During Therapy WFL for tasks assessed/performed                   Past Medical History:  Diagnosis Date   A-fib ([place])    Acute right-sided low back pain    Arrhythmia    Chronic a-fib ([facility])    Closed fracture of left distal radius    Contact dermatitis 04/16/2017   Depression    Depression    Hypothyroidism    Low back pain    Morbid obesity, BMI unknown ([place])    Osteoarthritis    oa in bilateral knees   Schizophrenia ([place])    Seizures ([place])    Thyroid disease    Varicose veins    Past Surgical History:  Procedure Laterality Date   ACHILLES TENDON REPAIR  approx. 2004   right foot   CATARACT EXTRACTION     bilateral   OPEN REDUCTION INTERNAL FIXATION (ORIF) DISTAL RADIAL FRACTURE Left 07/21/2018   Procedure: OPEN REDUCTION INTERNAL FIXATION (ORIF) DISTAL RADIAL FRACTURE;  Surgeon: [name], MD;  Location: [facility];  Service: Orthopedics;  Laterality: Left;   stab phlebectomy  Right 11/09/2016   stab phlebectomy R leg by Tinnie Gens MD   TOOTH EXTRACTION     Patient Active Problem List   Diagnosis Date Noted   Bilateral primary osteoarthritis of hip 08/13/2021   Statin intolerance 06/24/2021   Left knee pain 07/23/2020   Hip pain 01/26/2020   Spinal stenosis of lumbar region 01/16/2020   Chronic lumbar radiculopathy 12/08/2019   Hyperlipidemia associated with type 2 diabetes mellitus ([place]) 12/08/2019   Dizziness 11/03/2019   Paronychia of left index finger 05/19/2019    Fall at home 11/24/2018   Rosacea 10/24/2018   Loss of weight 09/09/2018   Schizoaffective disorder, depressive type ([place]) 04/08/2018   Varicose veins of bilateral lower extremities with other complications 01/60/1093   Erectile dysfunction 04/26/2015   Hypothyroidism    Seizures ([facility])    Persistent atrial fibrillation ([facility])    BMI 37.0-37.9, adult    Acute bilateral low back pain without sciatica    Seborrheic dermatitis 02/10/2007   T2DM (type 2 diabetes mellitus) ([place]) 02/10/2007    PCP: Rise Patience, MD  REFERRING PROVIDER: Rise Patience, MD  REFERRING DIAG:  W19.XXXA,Y92.009 (ICD-10-CM) - Fall in home, initial encounter  M16.0 (ICD-10-CM) - Bilateral primary osteoarthritis of hip    Rationale for Evaluation and Treatment Rehabilitation  THERAPY DIAG:  Other abnormalities of gait and mobility  Muscle weakness (generalized)  Pain in left hip  Pain in right hip  ONSET DATE: fall 1 month ago.  Chronic lumbar and LE pain  SUBJECTIVE:  SUBJECTIVE STATEMENT: I'm doing better with less use of my heating pad. I'm feeling tired today.      PERTINENT HISTORY:  A-fib, chronic LBP, depression, schizophrenia, tremors from medication  PAIN:  Are you having pain? Yes: NPRS scale: 5/10 Pain location: Lower left back Pain description: stiff, sore Aggravating factors: sitting/driving, walking  Relieving factors: stretching, rest, heating pad   Walking limited to 10 minutes-baseline for this patient   PRECAUTIONS: Fall  WEIGHT BEARING RESTRICTIONS No  FALLS:  Has patient fallen in last 6 months? Yes. Number of falls 1, fell when descending steps.  PT will address balance in treatment.    LIVING ENVIRONMENT: Lives with: lives alone Lives in: House/apartment Stairs: Yes: External:  12-13  Has following equipment at home: None  OCCUPATION: on disability  PLOF: Independent with basic ADLs  PATIENT GOALS reduce Rt LE pain, improve balance and mobility  OBJECTIVE:   DIAGNOSTIC FINDINGS:  X-ray of hips and low back on 02/09/22: DDD in lumbar spine, otherwise unremarkable   COGNITION:  Overall cognitive status: History of cognitive impairments - at baseline     SENSATION: WFL  MUSCLE LENGTH: Limited by 50% bil.   POSTURE: rounded shoulders, forward head, and flexed trunk   PALPATION: Mild palpable tenderness and trigger points in Rt gluteals and Rt>Lt lumbar spine   LUMBAR ROM:  WFLs, baseline for this patient. Rt lumbar pain reported with A/ROM  LOWER EXTREMITY ROM:    IR limited by 50% bil   LOWER EXTREMITY MMT:   Rt hip 4/5, Lt hip 4+/5, Bil knees 4+/5 with pain reported with testing.  FUNCTIONAL TESTS:  5 times sit to stand: 20 seconds  LEFS: 48.75%  04/06/22: 5 times sit to stand: 18.94 seconds   GAIT: Distance walked: 100 Assistive device utilized: None Level of assistance: Complete Independence Comments: reduced trunk rotation, flexed trunk, ER at bil hips  TODAY/'S TREATMENT  04/06/22: Nustep L2 12 min, 10 seconds : pt goal of  1/2 mile with PT present to discuss status post last visit.  Seated shoulder 3 ways: 1# 2x10 bil Sit to stand 2x5 each  Seated forward flexion stretch with blue ball 10x and lateral x 5 each Standing hip extension x10 bil  Sidelying Addaday low back, mid back and Rt hip and Lt hip in sidelying.   04/02/22: Nustep L2 14:20 min: pt goal of  1/2 mile with PT present to discuss status post last visit.  Seated shoulder 3 ways: 1# 2x5 bil Press into foam roll 5" hold x 10-verbal cues to avoid leaning forward Seated forward flexion stretch with blue ball 10x and lateral x 5 each Supine SKC 3x Bil. lower trunk rotation 10x side to side.  Sidelying Addaday low back, mid back and Rt hip and Lt hip in sidelying.    03/30/22: Nustep L2 14:20 min: pt goal of  1/2 mile with PT present to discuss status post last visit.  Seated shoulder 3 ways: 1# 2x5 bil Press into foam roll 5" hold x 10-verbal cues to avoid leaning forward Seated forward flexion stretch with blue ball 10x and lateral x 5 each Supine SKC 3x Bil. lower trunk rotation 10x side to side.  Sidelying Addaday low back, mid back and Rt hip and Lt hip in sidelying.         PATIENT EDUCATION:  Education details: Access Code: [email or web address] Person educated: Patient Education method: Explanation, Demonstration, and Handouts Education comprehension: verbalized understanding and returned demonstration   HOME EXERCISE PROGRAM:  Access Code: [email or web address] URL: [email or web address] Date: 03/04/2022 Prepared by: Claiborne Billings  Exercises - Seated Long Arc Quad  - 3 x daily - 7 x weekly - 2 sets - 10 reps - 5 hold - Seated March   - 3 x daily - 7 x weekly - 3 sets - 10 reps - Seated Heel Toe Raises   - 3 x daily - 7 x weekly - 2 sets - 10 reps - Sit to Stand Without Arm Support  - 2 x daily - 7 x weekly - 1 sets - 10 reps - Seated Hamstring Stretch  - 3 x daily - 7 x weekly - 1 sets - 3 reps - 20 hold  ASSESSMENT:  CLINICAL IMPRESSION: Pt reports reduced pain overall and has been using his heating pad less.  Pt is able to participate in all gentle PT activities today including > 10 min on the NuStep.  Pt completed his 1/2 mile distance on the NuStep in 12 min, 10 seconds, improved from 13 min last session.  PT monitored all exercise for pain and technique.  PT encouraged pt to continue to exercise to maintain mobility. Pt with chronic condition and multiple co morbidities that are impacting his progress. Patient will benefit from skilled PT to address the below impairments and improve overall function.   OBJECTIVE IMPAIRMENTS Abnormal gait, decreased activity tolerance, decreased mobility, difficulty walking, decreased strength, increased muscle  spasms, impaired flexibility, improper body mechanics, postural dysfunction, and pain.   ACTIVITY LIMITATIONS lifting, sitting, standing, squatting, stairs, dressing, and locomotion level  PARTICIPATION LIMITATIONS: meal prep, cleaning, laundry, and community activity  PERSONAL FACTORS Fitness, Past/current experiences, and 1-2 comorbidities: depression, chronic pain  are also affecting patient's functional outcome.   REHAB POTENTIAL: Good  CLINICAL DECISION MAKING: Evolving/moderate complexity  EVALUATION COMPLEXITY: Moderate   GOALS: Goals reviewed with patient? Yes  SHORT TERM GOALS: Target date: 04/01/2022  Be independent in initial HEP Baseline: Goal status: met  2.  Improve LEFS to > or = to 60% to improve function Baseline: 45% Goal status: INITIAL  3.  Report > or = to 30% reduction in LBP and Rt LE pain with ADLs and self-care  Baseline: some improvement today, not rated (04/02/22) Goal status: met    LONG TERM GOALS: Target date: 04/29/2022  Be independent in advanced HEP Baseline:  Goal status: INITIAL  2.  Improve LEFS to > or = to 70% to improve function Baseline: 45% Goal status: INITIAL  3.  Perform 5x sit to stand in < or = to 14 seconds to reduce falls risk  Baseline: 18.94 seconds  Goal status: In progress   4.  Report > or = to 60% reduction in LBP and Rt LE pain with ADLs and self-care Baseline:  Goal status: INITIAL    PLAN: PT FREQUENCY: 1-2x/week  PT DURATION: 8 weeks  PLANNED INTERVENTIONS: Therapeutic exercises, Therapeutic activity, Neuromuscular re-education, Balance training, Gait training, Patient/Family education, Self Care, Joint mobilization, Stair training, Aquatic Therapy, Dry Needling, Electrical stimulation, Spinal manipulation, Spinal mobilization, Cryotherapy, Moist heat, Taping, Ultrasound, Manual therapy, and Re-evaluation.  PLAN FOR NEXT SESSION: addaday to low back and Rt hip, encourage mobility, continue to advanced  exercises    Sigurd Sos, PT 04/06/22 2:46 PM   [facility] [address] [place], [place] [address] Phone # [phone] Fax [phone]

## 2022-04-09 ENCOUNTER — Ambulatory Visit: Payer: Medicare HMO

## 2022-04-09 DIAGNOSIS — M6281 Muscle weakness (generalized): Secondary | ICD-10-CM | POA: Diagnosis not present

## 2022-04-09 DIAGNOSIS — M25561 Pain in right knee: Secondary | ICD-10-CM | POA: Diagnosis not present

## 2022-04-09 DIAGNOSIS — H524 Presbyopia: Secondary | ICD-10-CM | POA: Diagnosis not present

## 2022-04-09 DIAGNOSIS — M25551 Pain in right hip: Secondary | ICD-10-CM

## 2022-04-09 DIAGNOSIS — G8929 Other chronic pain: Secondary | ICD-10-CM | POA: Diagnosis not present

## 2022-04-09 DIAGNOSIS — M5459 Other low back pain: Secondary | ICD-10-CM | POA: Diagnosis not present

## 2022-04-09 DIAGNOSIS — M25552 Pain in left hip: Secondary | ICD-10-CM | POA: Diagnosis not present

## 2022-04-09 DIAGNOSIS — M25562 Pain in left knee: Secondary | ICD-10-CM | POA: Diagnosis not present

## 2022-04-09 DIAGNOSIS — M5441 Lumbago with sciatica, right side: Secondary | ICD-10-CM | POA: Diagnosis not present

## 2022-04-09 DIAGNOSIS — H43813 Vitreous degeneration, bilateral: Secondary | ICD-10-CM | POA: Diagnosis not present

## 2022-04-09 DIAGNOSIS — H16223 Keratoconjunctivitis sicca, not specified as Sjogren's, bilateral: Secondary | ICD-10-CM | POA: Diagnosis not present

## 2022-04-09 DIAGNOSIS — Z961 Presence of intraocular lens: Secondary | ICD-10-CM | POA: Diagnosis not present

## 2022-04-09 DIAGNOSIS — H353130 Nonexudative age-related macular degeneration, bilateral, stage unspecified: Secondary | ICD-10-CM | POA: Diagnosis not present

## 2022-04-09 DIAGNOSIS — R2689 Other abnormalities of gait and mobility: Secondary | ICD-10-CM

## 2022-04-09 NOTE — Therapy (Signed)
OUTPATIENT PHYSICAL THERAPY THORACOLUMBAR EVALUATION   Patient Name: [name] MRN: [id number] DOB:[birth year], 61 y.o., male Today's Date: 04/09/2022   PT End of Session - 04/09/22 1320     Visit Number 9    Date for PT Re-Evaluation 04/29/22    Authorization Type [employer]    Progress Note Due on Visit 19    PT Start Time 1241    PT Stop Time 1320    PT Time Calculation (min) 39 min    Activity Tolerance Patient tolerated treatment well    Behavior During Therapy [facility] for tasks assessed/performed                Progress Note Reporting Period 03/04/22 to 04/09/22  See note below for Objective Data and Assessment of Progress/Goals.        Past Medical History:  Diagnosis Date   A-fib [facility])    Acute right-sided low back pain    Arrhythmia    Chronic a-fib (HCC)    Closed fracture of left distal radius    Contact dermatitis 04/16/2017   Depression    Depression    Hypothyroidism    Low back pain    Morbid obesity, BMI unknown ([place])    Osteoarthritis    oa in bilateral knees   Schizophrenia ([place])    Seizures ([place])    Thyroid disease    Varicose veins    Past Surgical History:  Procedure Laterality Date   ACHILLES TENDON REPAIR  approx. 2004   right foot   CATARACT EXTRACTION     bilateral   OPEN REDUCTION INTERNAL FIXATION (ORIF) DISTAL RADIAL FRACTURE Left 07/21/2018   Procedure: OPEN REDUCTION INTERNAL FIXATION (ORIF) DISTAL RADIAL FRACTURE;  Surgeon: [name], MD;  Location: [place];  Service: Orthopedics;  Laterality: Left;   stab phlebectomy  Right 11/09/2016   stab phlebectomy R leg by Tinnie Gens MD   TOOTH EXTRACTION     Patient Active Problem List   Diagnosis Date Noted   Bilateral primary osteoarthritis of hip 08/13/2021   Statin intolerance 06/24/2021   Left knee pain 07/23/2020   Hip pain 01/26/2020   Spinal stenosis of lumbar region 01/16/2020   Chronic lumbar radiculopathy 12/08/2019   Hyperlipidemia  associated with type 2 diabetes mellitus ([place]) 12/08/2019   Dizziness 11/03/2019   Paronychia of left index finger 05/19/2019   Fall at home 11/24/2018   Rosacea 10/24/2018   Loss of weight 09/09/2018   Schizoaffective disorder, depressive type ([place]) 04/08/2018   Varicose veins of bilateral lower extremities with other complications 84/66/5993   Erectile dysfunction 04/26/2015   Hypothyroidism    Seizures (HCC)    Persistent atrial fibrillation (HCC)    BMI 37.0-37.9, adult    Acute bilateral low back pain without sciatica    Seborrheic dermatitis 02/10/2007   T2DM (type 2 diabetes mellitus) ([place]) 02/10/2007    PCP: Rise Patience, MD  REFERRING PROVIDER: Rise Patience, MD  REFERRING DIAG:  W19.XXXA,Y92.009 (ICD-10-CM) - Fall in home, initial encounter  M16.0 (ICD-10-CM) - Bilateral primary osteoarthritis of hip    Rationale for Evaluation and Treatment Rehabilitation  THERAPY DIAG:  Other abnormalities of gait and mobility  Muscle weakness (generalized)  Pain in left hip  Pain in right hip  Other low back pain  ONSET DATE: fall 1 month ago.  Chronic lumbar and LE pain  SUBJECTIVE:  SUBJECTIVE STATEMENT: I'm feeling good today.  I've had a good week.  70% better overall.    PERTINENT HISTORY:  A-fib, chronic LBP, depression, schizophrenia, tremors from medication  PAIN:  Are you having pain? Yes: NPRS scale: 5/10 Pain location: Lower left back Pain description: stiff, sore Aggravating factors: sitting/driving, walking  Relieving factors: stretching, rest, heating pad   Walking limited to 10 minutes-baseline for this patient   PRECAUTIONS: Fall  WEIGHT BEARING RESTRICTIONS No  FALLS:  Has patient fallen in last 6 months? Yes. Number of falls 1, fell when descending steps.   PT will address balance in treatment.    LIVING ENVIRONMENT: Lives with: lives alone Lives in: House/apartment Stairs: Yes: External: 12-13  Has following equipment at home: None  OCCUPATION: on disability  PLOF: Independent with basic ADLs  PATIENT GOALS reduce Rt LE pain, improve balance and mobility  OBJECTIVE:   DIAGNOSTIC FINDINGS:  X-ray of hips and low back on 02/09/22: DDD in lumbar spine, otherwise unremarkable   COGNITION:  Overall cognitive status: History of cognitive impairments - at baseline     SENSATION: WFL  MUSCLE LENGTH: Limited by 50% bil.   POSTURE: rounded shoulders, forward head, and flexed trunk   PALPATION: Mild palpable tenderness and trigger points in Rt gluteals and Rt>Lt lumbar spine   LUMBAR ROM:  WFLs, baseline for this patient. Rt lumbar pain reported with A/ROM  LOWER EXTREMITY ROM:    IR limited by 50% bil   LOWER EXTREMITY MMT:   Rt hip 4/5, Lt hip 4+/5, Bil knees 4+/5 with pain reported with testing.  FUNCTIONAL TESTS:  5 times sit to stand: 20 seconds  LEFS: 48.75%  04/06/22: 5 times sit to stand: 18.94 seconds   GAIT: Distance walked: 100 Assistive device utilized: None Level of assistance: Complete Independence Comments: reduced trunk rotation, flexed trunk, ER at bil hips  TODAY/'S TREATMENT  04/09/22: Nustep L2 12 min, 10 seconds : pt goal of  1/2 mile with PT present to discuss status post last visit.  Seated shoulder 3 ways: 1# 2x10 bil Sit to stand 2x5 each  Seated forward flexion stretch with blue ball 10x and lateral x 5 each Standing hip extension x10 bil  Sidelying Addaday low back, mid back and Rt hip and Lt hip in sidelying.   04/06/22: Nustep L2 12 min, 10 seconds : pt goal of  1/2 mile with PT present to discuss status post last visit.  Seated shoulder 3 ways: 1# 2x10 bil Sit to stand 2x5 each  Seated forward flexion stretch with blue ball 10x and lateral x 5 each Standing hip extension x10 bil   Sidelying Addaday low back, mid back and Rt hip and Lt hip in sidelying.   04/02/22: Nustep L2 14:20 min: pt goal of  1/2 mile with PT present to discuss status post last visit.  Seated shoulder 3 ways: 1# 2x5 bil Press into foam roll 5" hold x 10-verbal cues to avoid leaning forward Seated forward flexion stretch with blue ball 10x and lateral x 5 each Supine SKC 3x Bil. lower trunk rotation 10x side to side.  Sidelying Addaday low back, mid back and Rt hip and Lt hip in sidelying.        PATIENT EDUCATION:  Education details: Access Code: [email or web address] Person educated: Patient Education method: Explanation, Media planner, and Handouts Education comprehension: verbalized understanding and returned demonstration   HOME EXERCISE PROGRAM: Access Code: [email or web address] URL: [email or web address] Date: 03/04/2022 Prepared by: Claiborne Billings  Exercises -  Seated Long Arc Quad  - 3 x daily - 7 x weekly - 2 sets - 10 reps - 5 hold - Seated March   - 3 x daily - 7 x weekly - 3 sets - 10 reps - Seated Heel Toe Raises   - 3 x daily - 7 x weekly - 2 sets - 10 reps - Sit to Stand Without Arm Support  - 2 x daily - 7 x weekly - 1 sets - 10 reps - Seated Hamstring Stretch  - 3 x daily - 7 x weekly - 1 sets - 3 reps - 20 hold  ASSESSMENT:  CLINICAL IMPRESSION: Pt reports that he is 70% better overall.  He has had a good week with reduced pain levels and improved ability to perform functional mobility.   Pt completed his 1/2 mile distance on the NuStep in 12 min, 10 seconds, improved from 13 min last session.  PT monitored all exercise for pain and technique.  5x sit to stand in improved this week, although pt is still at risk for falls.  PT encouraged pt to continue to exercise to maintain mobility. Pt with chronic condition and multiple co morbidities that are impacting his progress. Patient will benefit from skilled PT to address the below impairments and improve overall function.   OBJECTIVE  IMPAIRMENTS Abnormal gait, decreased activity tolerance, decreased mobility, difficulty walking, decreased strength, increased muscle spasms, impaired flexibility, improper body mechanics, postural dysfunction, and pain.   ACTIVITY LIMITATIONS lifting, sitting, standing, squatting, stairs, dressing, and locomotion level  PARTICIPATION LIMITATIONS: meal prep, cleaning, laundry, and community activity  PERSONAL FACTORS Fitness, Past/current experiences, and 1-2 comorbidities: depression, chronic pain  are also affecting patient's functional outcome.   REHAB POTENTIAL: Good  CLINICAL DECISION MAKING: Evolving/moderate complexity  EVALUATION COMPLEXITY: Moderate   GOALS: Goals reviewed with patient? Yes  SHORT TERM GOALS: Target date: 04/01/2022  Be independent in initial HEP Baseline: Goal status: met  2.  Improve LEFS to > or = to 60% to improve function Baseline: 45% Goal status: INITIAL  3.  Report > or = to 30% reduction in LBP and Rt LE pain with ADLs and self-care  Baseline: 70% (04/09/22) Goal status: met    LONG TERM GOALS: Target date: 04/29/2022  Be independent in advanced HEP Baseline:  Goal status: INITIAL  2.  Improve LEFS to > or = to 70% to improve function Baseline: 45% Goal status: In progress  3.  Perform 5x sit to stand in < or = to 14 seconds to reduce falls risk  Baseline: 18.94 seconds  Goal status: In progress   4.  Report > or = to 60% reduction in LBP and Rt LE pain with ADLs and self-care Baseline:  Goal status: INITIAL    PLAN: PT FREQUENCY: 1-2x/week  PT DURATION: 8 weeks  PLANNED INTERVENTIONS: Therapeutic exercises, Therapeutic activity, Neuromuscular re-education, Balance training, Gait training, Patient/Family education, Self Care, Joint mobilization, Stair training, Aquatic Therapy, Dry Needling, Electrical stimulation, Spinal manipulation, Spinal mobilization, Cryotherapy, Moist heat, Taping, Ultrasound, Manual therapy, and  Re-evaluation.  PLAN FOR NEXT SESSION: addaday to low back and Rt hip, encourage mobility, continue to advanced exercises.  Next progress note due on visit 19.  Give LEFS next visit.     Sigurd Sos, PT 04/09/22 1:21 PM   [facility] [address] [place], [place] [address] Phone # [phone] Fax [phone]

## 2022-04-13 ENCOUNTER — Encounter: Payer: Self-pay | Admitting: Physical Therapy

## 2022-04-13 ENCOUNTER — Ambulatory Visit: Payer: Medicare HMO | Admitting: Physical Therapy

## 2022-04-13 DIAGNOSIS — M6281 Muscle weakness (generalized): Secondary | ICD-10-CM

## 2022-04-13 DIAGNOSIS — M25551 Pain in right hip: Secondary | ICD-10-CM | POA: Diagnosis not present

## 2022-04-13 DIAGNOSIS — M25552 Pain in left hip: Secondary | ICD-10-CM

## 2022-04-13 DIAGNOSIS — M25562 Pain in left knee: Secondary | ICD-10-CM | POA: Diagnosis not present

## 2022-04-13 DIAGNOSIS — R2689 Other abnormalities of gait and mobility: Secondary | ICD-10-CM

## 2022-04-13 DIAGNOSIS — M5441 Lumbago with sciatica, right side: Secondary | ICD-10-CM | POA: Diagnosis not present

## 2022-04-13 DIAGNOSIS — G8929 Other chronic pain: Secondary | ICD-10-CM | POA: Diagnosis not present

## 2022-04-13 DIAGNOSIS — M25561 Pain in right knee: Secondary | ICD-10-CM | POA: Diagnosis not present

## 2022-04-13 DIAGNOSIS — M5459 Other low back pain: Secondary | ICD-10-CM | POA: Diagnosis not present

## 2022-04-13 NOTE — Therapy (Signed)
OUTPATIENT PHYSICAL THERAPY THORACOLUMBAR EVALUATION   Patient Name: [name] MRN: [id number] DOB:[birth year], 61 y.o., male Today's Date: 04/13/2022   PT End of Session - 04/13/22 1411     Visit Number 10    Date for PT Re-Evaluation 04/29/22    Authorization Type [employer] Medicare    Progress Note Due on Visit 19    PT Start Time 1400    PT Stop Time 1447    PT Time Calculation (min) 47 min    Activity Tolerance Patient tolerated treatment well    Behavior During Therapy [facility] for tasks assessed/performed           .        Past Medical History:  Diagnosis Date   A-fib [facility])    Acute right-sided low back pain    Arrhythmia    Chronic a-fib (HCC)    Closed fracture of left distal radius    Contact dermatitis 04/16/2017   Depression    Depression    Hypothyroidism    Low back pain    Morbid obesity, BMI unknown ([place])    Osteoarthritis    oa in bilateral knees   Schizophrenia ([place])    Seizures ([place])    Thyroid disease    Varicose veins    Past Surgical History:  Procedure Laterality Date   ACHILLES TENDON REPAIR  approx. 2004   right foot   CATARACT EXTRACTION     bilateral   OPEN REDUCTION INTERNAL FIXATION (ORIF) DISTAL RADIAL FRACTURE Left 07/21/2018   Procedure: OPEN REDUCTION INTERNAL FIXATION (ORIF) DISTAL RADIAL FRACTURE;  Surgeon: [name], MD;  Location: [place];  Service: Orthopedics;  Laterality: Left;   stab phlebectomy  Right 11/09/2016   stab phlebectomy R leg by Tinnie Gens MD   TOOTH EXTRACTION     Patient Active Problem List   Diagnosis Date Noted   Bilateral primary osteoarthritis of hip 08/13/2021   Statin intolerance 06/24/2021   Left knee pain 07/23/2020   Hip pain 01/26/2020   Spinal stenosis of lumbar region 01/16/2020   Chronic lumbar radiculopathy 12/08/2019   Hyperlipidemia associated with type 2 diabetes mellitus ([place]) 12/08/2019   Dizziness 11/03/2019   Paronychia of left index finger 05/19/2019    Fall at home 11/24/2018   Rosacea 10/24/2018   Loss of weight 09/09/2018   Schizoaffective disorder, depressive type ([place]) 04/08/2018   Varicose veins of bilateral lower extremities with other complications 67/59/1638   Erectile dysfunction 04/26/2015   Hypothyroidism    Seizures (HCC)    Persistent atrial fibrillation (HCC)    BMI 37.0-37.9, adult    Acute bilateral low back pain without sciatica    Seborrheic dermatitis 02/10/2007   T2DM (type 2 diabetes mellitus) ([place]) 02/10/2007    PCP: Rise Patience, MD  REFERRING PROVIDER: Rise Patience, MD  REFERRING DIAG:  W19.XXXA,Y92.009 (ICD-10-CM) - Fall in home, initial encounter  M16.0 (ICD-10-CM) - Bilateral primary osteoarthritis of hip    Rationale for Evaluation and Treatment Rehabilitation  THERAPY DIAG:  Other abnormalities of gait and mobility  Muscle weakness (generalized)  Pain in left hip  Pain in right hip  Other low back pain  Chronic pain of left knee  Chronic bilateral low back pain with right-sided sciatica  Chronic pain of right knee  ONSET DATE: fall 1 month ago.  Chronic lumbar and LE pain  SUBJECTIVE:  SUBJECTIVE STATEMENT: I just woke up. I feel tight.    PERTINENT HISTORY:  A-fib, chronic LBP, depression, schizophrenia, tremors from medication  PAIN:  Are you having pain? Yes: NPRS scale: 5/10 Pain location: Lower left back Pain description: stiff, sore Aggravating factors: sitting/driving, walking  Relieving factors: stretching, rest, heating pad   Walking limited to 10 minutes-baseline for this patient   PRECAUTIONS: Fall  WEIGHT BEARING RESTRICTIONS No  FALLS:  Has patient fallen in last 6 months? Yes. Number of falls 1, fell when descending steps.  PT will address balance in treatment.     LIVING ENVIRONMENT: Lives with: lives alone Lives in: House/apartment Stairs: Yes: External: 12-13  Has following equipment at home: None  OCCUPATION: on disability  PLOF: Independent with basic ADLs  PATIENT GOALS reduce Rt LE pain, improve balance and mobility  OBJECTIVE:   DIAGNOSTIC FINDINGS:  X-ray of hips and low back on 02/09/22: DDD in lumbar spine, otherwise unremarkable   COGNITION:  Overall cognitive status: History of cognitive impairments - at baseline     SENSATION: WFL  MUSCLE LENGTH: Limited by 50% bil.   POSTURE: rounded shoulders, forward head, and flexed trunk   PALPATION: Mild palpable tenderness and trigger points in Rt gluteals and Rt>Lt lumbar spine   LUMBAR ROM:  WFLs, baseline for this patient. Rt lumbar pain reported with A/ROM  LOWER EXTREMITY ROM:    IR limited by 50% bil   LOWER EXTREMITY MMT:   Rt hip 4/5, Lt hip 4+/5, Bil knees 4+/5 with pain reported with testing.  FUNCTIONAL TESTS:  5 times sit to stand: 20 seconds  LEFS: 48.75%  04/06/22: 5 times sit to stand: 18.94 seconds   GAIT: Distance walked: 100 Assistive device utilized: None Level of assistance: Complete Independence Comments: reduced trunk rotation, flexed trunk, ER at bil hips  TODAY/'S TREATMENT   04/13/22:  Nustep L2 12 min, 10 seconds .41 miles today with PTA present to discuss status post last visit.  Seated shoulder 3 ways: 2# x10 bil Sit to stand 2x5 each  Seated forward flexion stretch with blue ball 10x and lateral x 5 each Standing hip extension x10 bil  Sidelying Addaday low back, LT glute in sidelying.   04/09/22: Nustep L2 12 min, 10 seconds : pt goal of  1/2 mile with PT present to discuss status post last visit.  Seated shoulder 3 ways: 1# 2x10 bil Sit to stand 2x5 each  Seated forward flexion stretch with blue ball 10x and lateral x 5 each Standing hip extension x10 bil  Sidelying Addaday low back, mid back and Rt hip and Lt hip in sidelying.    04/06/22: Nustep L2 12 min, 10 seconds : pt goal of  1/2 mile with PT present to discuss status post last visit.  Seated shoulder 3 ways: 1# 2x10 bil Sit to stand 2x5 each  Seated forward flexion stretch with blue ball 10x and lateral x 5 each Standing hip extension x10 bil  Sidelying Addaday low back, mid back and Rt hip and Lt hip in sidelying.        PATIENT EDUCATION:  Education details: Access Code: [email or web address] Person educated: Patient Education method: Explanation, Media planner, and Handouts Education comprehension: verbalized understanding and returned demonstration   HOME EXERCISE PROGRAM: Access Code: [email or web address] URL: [email or web address] Date: 03/04/2022 Prepared by: Claiborne Billings  Exercises - Seated Long Arc Quad  - 3 x daily - 7 x weekly - 2 sets - 10 reps - 5 hold - Seated March   -  3 x daily - 7 x weekly - 3 sets - 10 reps - Seated Heel Toe Raises   - 3 x daily - 7 x weekly - 2 sets - 10 reps - Sit to Stand Without Arm Support  - 2 x daily - 7 x weekly - 1 sets - 10 reps - Seated Hamstring Stretch  - 3 x daily - 7 x weekly - 1 sets - 3 reps - 20 hold  ASSESSMENT:  CLINICAL IMPRESSION:   Pt arrives with "a little back pain." Pt reports he is having more consistent non-pain moments. Pt required a longer tie to get adjusted on the Nustep today. Pt reports stretching 1x day at home.    OBJECTIVE IMPAIRMENTS Abnormal gait, decreased activity tolerance, decreased mobility, difficulty walking, decreased strength, increased muscle spasms, impaired flexibility, improper body mechanics, postural dysfunction, and pain.   ACTIVITY LIMITATIONS lifting, sitting, standing, squatting, stairs, dressing, and locomotion level  PARTICIPATION LIMITATIONS: meal prep, cleaning, laundry, and community activity  PERSONAL FACTORS Fitness, Past/current experiences, and 1-2 comorbidities: depression, chronic pain  are also affecting patient's functional outcome.   REHAB POTENTIAL:  Good  CLINICAL DECISION MAKING: Evolving/moderate complexity  EVALUATION COMPLEXITY: Moderate   GOALS: Goals reviewed with patient? Yes  SHORT TERM GOALS: Target date: 04/01/2022  Be independent in initial HEP Baseline: Goal status: met  2.  Improve LEFS to > or = to 60% to improve function Baseline: 45% Goal status: INITIAL  3.  Report > or = to 30% reduction in LBP and Rt LE pain with ADLs and self-care  Baseline: 70% (04/09/22) Goal status: met    LONG TERM GOALS: Target date: 04/29/2022  Be independent in advanced HEP Baseline:  Goal status: INITIAL  2.  Improve LEFS to > or = to 70% to improve function Baseline: 45% Goal status: In progress  3.  Perform 5x sit to stand in < or = to 14 seconds to reduce falls risk  Baseline: 18.94 seconds  Goal status: In progress   4.  Report > or = to 60% reduction in LBP and Rt LE pain with ADLs and self-care Baseline:  Goal status: INITIAL    PLAN: PT FREQUENCY: 1-2x/week  PT DURATION: 8 weeks  PLANNED INTERVENTIONS: Therapeutic exercises, Therapeutic activity, Neuromuscular re-education, Balance training, Gait training, Patient/Family education, Self Care, Joint mobilization, Stair training, Aquatic Therapy, Dry Needling, Electrical stimulation, Spinal manipulation, Spinal mobilization, Cryotherapy, Moist heat, Taping, Ultrasound, Manual therapy, and Re-evaluation.  PLAN FOR NEXT SESSION: addaday to low back and Rt hip, encourage mobility, continue to advanced exercises.   Give LEFS next visit.     Myrene Galas, PTA 04/13/22 2:48 PM    [facility] [address] [place] [address] [place], [place] [address] Phone # [phone] Fax [phone]

## 2022-04-14 DIAGNOSIS — Z0100 Encounter for examination of eyes and vision without abnormal findings: Secondary | ICD-10-CM | POA: Diagnosis not present

## 2022-04-15 ENCOUNTER — Ambulatory Visit (INDEPENDENT_AMBULATORY_CARE_PROVIDER_SITE_OTHER): Payer: Medicare HMO | Admitting: Surgery

## 2022-04-15 ENCOUNTER — Encounter: Payer: Self-pay | Admitting: Surgery

## 2022-04-15 ENCOUNTER — Other Ambulatory Visit: Payer: Self-pay | Admitting: Family Medicine

## 2022-04-15 VITALS — BP 122/76 | HR 81 | Ht 70.0 in | Wt 257.2 lb

## 2022-04-15 DIAGNOSIS — M5416 Radiculopathy, lumbar region: Secondary | ICD-10-CM

## 2022-04-16 ENCOUNTER — Ambulatory Visit: Payer: Medicare HMO

## 2022-04-16 DIAGNOSIS — M25552 Pain in left hip: Secondary | ICD-10-CM | POA: Diagnosis not present

## 2022-04-16 DIAGNOSIS — M6281 Muscle weakness (generalized): Secondary | ICD-10-CM | POA: Diagnosis not present

## 2022-04-16 DIAGNOSIS — M25551 Pain in right hip: Secondary | ICD-10-CM | POA: Diagnosis not present

## 2022-04-16 DIAGNOSIS — R2689 Other abnormalities of gait and mobility: Secondary | ICD-10-CM | POA: Diagnosis not present

## 2022-04-16 DIAGNOSIS — M5459 Other low back pain: Secondary | ICD-10-CM | POA: Diagnosis not present

## 2022-04-16 DIAGNOSIS — G8929 Other chronic pain: Secondary | ICD-10-CM | POA: Diagnosis not present

## 2022-04-16 DIAGNOSIS — M5441 Lumbago with sciatica, right side: Secondary | ICD-10-CM | POA: Diagnosis not present

## 2022-04-16 DIAGNOSIS — M25561 Pain in right knee: Secondary | ICD-10-CM | POA: Diagnosis not present

## 2022-04-16 DIAGNOSIS — M25562 Pain in left knee: Secondary | ICD-10-CM | POA: Diagnosis not present

## 2022-04-16 NOTE — Therapy (Signed)
OUTPATIENT PHYSICAL THERAPY THORACOLUMBAR EVALUATION   Patient Name: [name] MRN: [id number] DOB:1960/08/13, 61 y.o., male Today's Date: 04/16/2022   PT End of Session - 04/16/22 1310     Visit Number 11    Date for PT Re-Evaluation 04/29/22    Authorization Type [employer]    Progress Note Due on Visit 19    PT Start Time 1147    PT Stop Time 1231    PT Time Calculation (min) 44 min            .        Past Medical History:  Diagnosis Date   A-fib [facility])    Acute right-sided low back pain    Arrhythmia    Chronic a-fib (HCC)    Closed fracture of left distal radius    Contact dermatitis 04/16/2017   Depression    Depression    Hypothyroidism    Low back pain    Morbid obesity, BMI unknown ([place])    Osteoarthritis    oa in bilateral knees   Schizophrenia ([place])    Seizures ([place])    Thyroid disease    Varicose veins    Past Surgical History:  Procedure Laterality Date   ACHILLES TENDON REPAIR  approx. 2004   right foot   CATARACT EXTRACTION     bilateral   OPEN REDUCTION INTERNAL FIXATION (ORIF) DISTAL RADIAL FRACTURE Left 07/21/2018   Procedure: OPEN REDUCTION INTERNAL FIXATION (ORIF) DISTAL RADIAL FRACTURE;  Surgeon: [name], MD;  Location: [place];  Service: Orthopedics;  Laterality: Left;   stab phlebectomy  Right 11/09/2016   stab phlebectomy R leg by Tinnie Gens MD   TOOTH EXTRACTION     Patient Active Problem List   Diagnosis Date Noted   Bilateral primary osteoarthritis of hip 08/13/2021   Statin intolerance 06/24/2021   Left knee pain 07/23/2020   Hip pain 01/26/2020   Spinal stenosis of lumbar region 01/16/2020   Chronic lumbar radiculopathy 12/08/2019   Hyperlipidemia associated with type 2 diabetes mellitus ([place]) 12/08/2019   Dizziness 11/03/2019   Paronychia of left index finger 05/19/2019   Fall at home 11/24/2018   Rosacea 10/24/2018   Loss of weight 09/09/2018   Schizoaffective disorder,  depressive type ([place]) 04/08/2018   Varicose veins of bilateral lower extremities with other complications 62/95/2841   Erectile dysfunction 04/26/2015   Hypothyroidism    Seizures (HCC)    Persistent atrial fibrillation (HCC)    BMI 37.0-37.9, adult    Acute bilateral low back pain without sciatica    Seborrheic dermatitis 02/10/2007   T2DM (type 2 diabetes mellitus) ([place]) 02/10/2007    PCP: Rise Patience, MD  REFERRING PROVIDER: Rise Patience, MD  REFERRING DIAG:  W19.XXXA,Y92.009 (ICD-10-CM) - Fall in home, initial encounter  M16.0 (ICD-10-CM) - Bilateral primary osteoarthritis of hip    Rationale for Evaluation and Treatment Rehabilitation  THERAPY DIAG:  Other abnormalities of gait and mobility  Muscle weakness (generalized)  Pain in left hip  Pain in right hip  Other low back pain  ONSET DATE: fall 1 month ago.  Chronic lumbar and LE pain  SUBJECTIVE:  SUBJECTIVE STATEMENT: I saw an orthopedic doctor yesterday. They are going to set up some x-rays on my hip.  PERTINENT HISTORY:  A-fib, chronic LBP, depression, schizophrenia, tremors from medication  PAIN:  Are you having pain? Yes: NPRS scale: 5/10 Pain location: Lower left back Pain description: stiff, sore Aggravating factors: sitting/driving, walking  Relieving factors: stretching, rest, heating pad   Walking limited to 10 minutes-baseline for this patient   PRECAUTIONS: Fall  WEIGHT BEARING RESTRICTIONS No  FALLS:  Has patient fallen in last 6 months? Yes. Number of falls 1, fell when descending steps.  PT will address balance in treatment.    LIVING ENVIRONMENT: Lives with: lives alone Lives in: House/apartment Stairs: Yes: External: 12-13  Has following equipment at home: None  OCCUPATION: on  disability  PLOF: Independent with basic ADLs  PATIENT GOALS reduce Rt LE pain, improve balance and mobility  OBJECTIVE:   DIAGNOSTIC FINDINGS:  X-ray of hips and low back on 02/09/22: DDD in lumbar spine, otherwise unremarkable   COGNITION:  Overall cognitive status: History of cognitive impairments - at baseline     SENSATION: WFL  MUSCLE LENGTH: Limited by 50% bil.   POSTURE: rounded shoulders, forward head, and flexed trunk   PALPATION: Mild palpable tenderness and trigger points in Rt gluteals and Rt>Lt lumbar spine   LUMBAR ROM:  WFLs, baseline for this patient. Rt lumbar pain reported with A/ROM  LOWER EXTREMITY ROM:    IR limited by 50% bil   LOWER EXTREMITY MMT:   Rt hip 4/5, Lt hip 4+/5, Bil knees 4+/5 with pain reported with testing.  FUNCTIONAL TESTS:  5 times sit to stand: 20 seconds  LEFS: 48.75%  04/06/22: 5 times sit to stand: 18.94 seconds  04/16/22: 16.98 seconds   GAIT: Distance walked: 100 Assistive device utilized: None Level of assistance: Complete Independence Comments: reduced trunk rotation, flexed trunk, ER at bil hips  TODAY/'S TREATMENT  04/16/22: Nustep L4x 0.5 miles 14 min, 44 seconds  Seated shoulder 3 ways: 2# x10 bil Sit to stand 2x5 each  Seated forward flexion stretch with blue ball 10x and lateral x 5 each Sidelying Addaday Lt gluteals in sidelying.   04/13/22:  Nustep L2 12 min, 10 seconds .41 miles today with PTA present to discuss status post last visit.  Seated shoulder 3 ways: 2# x10 bil Sit to stand 2x5 each  Seated forward flexion stretch with blue ball 10x and lateral x 5 each Standing hip extension x10 bil  Sidelying Addaday low back, LT glute in sidelying.   04/09/22: Nustep L2 12 min, 10 seconds : pt goal of  1/2 mile with PT present to discuss status post last visit.  Seated shoulder 3 ways: 1# 2x10 bil Sit to stand 2x5 each  Seated forward flexion stretch with blue ball 10x and lateral x 5 each Standing hip  extension x10 bil  Sidelying Addaday low back, mid back and Rt hip and Lt hip in sidelying.          PATIENT EDUCATION:  Education details: Access Code: [email or web address] Person educated: Patient Education method: Explanation, Media planner, and Handouts Education comprehension: verbalized understanding and returned demonstration   HOME EXERCISE PROGRAM: Access Code: [email or web address] URL: [email or web address] Date: 03/04/2022 Prepared by: Claiborne Billings  Exercises - Seated Long Arc Quad  - 3 x daily - 7 x weekly - 2 sets - 10 reps - 5 hold - Seated March   - 3 x daily - 7 x weekly - 3 sets - 10  reps - Seated Heel Toe Raises   - 3 x daily - 7 x weekly - 2 sets - 10 reps - Sit to Stand Without Arm Support  - 2 x daily - 7 x weekly - 1 sets - 10 reps - Seated Hamstring Stretch  - 3 x daily - 7 x weekly - 1 sets - 3 reps - 20 hold  ASSESSMENT:  CLINICAL IMPRESSION: Pt continues to report 5/10 low back and buttock pain.  Pt demonstrates improved mobility overall and is able to do more in the clinic each week.  Last week he reported 70% overall improvement since the start of care.  LEFS is improved to 48.75 from 45 (% of max function calculated). Pt required tactile and verbal cues for core activation and erect posture.  5x sit to stand in improved to 16.98 seconds.  On 2nd trial, pt had reduced control with stand to sit.  Patient will benefit from skilled PT to address the below impairments and improve overall function.    OBJECTIVE IMPAIRMENTS Abnormal gait, decreased activity tolerance, decreased mobility, difficulty walking, decreased strength, increased muscle spasms, impaired flexibility, improper body mechanics, postural dysfunction, and pain.   ACTIVITY LIMITATIONS lifting, sitting, standing, squatting, stairs, dressing, and locomotion level  PARTICIPATION LIMITATIONS: meal prep, cleaning, laundry, and community activity  PERSONAL FACTORS Fitness, Past/current experiences, and 1-2  comorbidities: depression, chronic pain  are also affecting patient's functional outcome.   REHAB POTENTIAL: Good  CLINICAL DECISION MAKING: Evolving/moderate complexity  EVALUATION COMPLEXITY: Moderate   GOALS: Goals reviewed with patient? Yes  SHORT TERM GOALS: Target date: 04/01/2022  Be independent in initial HEP Baseline: Goal status: met  2.  Improve LEFS to > or = to 60% to improve function Baseline: 48.75 (04/16/22) Goal status: in progress   3.  Report > or = to 30% reduction in LBP and Rt LE pain with ADLs and self-care  Baseline: 70% (04/09/22) Goal status: met    LONG TERM GOALS: Target date: 04/29/2022  Be independent in advanced HEP Baseline:  Goal status: INITIAL  2.  Improve LEFS to > or = to 70% to improve function Baseline: 48.75 (04/16/22) Goal status: In progress  3.  Perform 5x sit to stand in < or = to 14 seconds to reduce falls risk  Baseline: 18.94 seconds  Goal status: In progress   4.  Report > or = to 60% reduction in LBP and Rt LE pain with ADLs and self-care Baseline:  Goal status: INITIAL    PLAN: PT FREQUENCY: 1-2x/week  PT DURATION: 8 weeks  PLANNED INTERVENTIONS: Therapeutic exercises, Therapeutic activity, Neuromuscular re-education, Balance training, Gait training, Patient/Family education, Self Care, Joint mobilization, Stair training, Aquatic Therapy, Dry Needling, Electrical stimulation, Spinal manipulation, Spinal mobilization, Cryotherapy, Moist heat, Taping, Ultrasound, Manual therapy, and Re-evaluation.  PLAN FOR NEXT SESSION: addaday to low back and Rt/Lt hip, encourage mobility, continue to advanced exercises.     Sigurd Sos, PT 04/16/22 1:11 PM   [facility] [address] [place], [place] [address] Phone # [phone] Fax [phone]

## 2022-04-20 ENCOUNTER — Ambulatory Visit: Payer: Medicare HMO | Admitting: Physical Therapy

## 2022-04-20 ENCOUNTER — Encounter: Payer: Self-pay | Admitting: Physical Therapy

## 2022-04-20 DIAGNOSIS — M25562 Pain in left knee: Secondary | ICD-10-CM | POA: Diagnosis not present

## 2022-04-20 DIAGNOSIS — M25552 Pain in left hip: Secondary | ICD-10-CM | POA: Diagnosis not present

## 2022-04-20 DIAGNOSIS — M25561 Pain in right knee: Secondary | ICD-10-CM | POA: Diagnosis not present

## 2022-04-20 DIAGNOSIS — R2689 Other abnormalities of gait and mobility: Secondary | ICD-10-CM

## 2022-04-20 DIAGNOSIS — M5459 Other low back pain: Secondary | ICD-10-CM

## 2022-04-20 DIAGNOSIS — M25551 Pain in right hip: Secondary | ICD-10-CM

## 2022-04-20 DIAGNOSIS — M6281 Muscle weakness (generalized): Secondary | ICD-10-CM | POA: Diagnosis not present

## 2022-04-20 DIAGNOSIS — M5441 Lumbago with sciatica, right side: Secondary | ICD-10-CM | POA: Diagnosis not present

## 2022-04-20 DIAGNOSIS — G8929 Other chronic pain: Secondary | ICD-10-CM | POA: Diagnosis not present

## 2022-04-20 NOTE — Therapy (Signed)
OUTPATIENT PHYSICAL THERAPY THORACOLUMBAR EVALUATION   Patient Name: [name] MRN: [id number] DOB:1960-08-11, 61 y.o., male Today's Date: 04/20/2022   PT End of Session - 04/20/22 1358     Visit Number 12    Date for PT Re-Evaluation 04/29/22    Authorization Type [employer] Medicare    Progress Note Due on Visit 19    PT Start Time 1358    PT Stop Time 1443    PT Time Calculation (min) 45 min    Activity Tolerance Patient tolerated treatment well    Behavior During Therapy [facility] for tasks assessed/performed             .        Past Medical History:  Diagnosis Date   A-fib [facility])    Acute right-sided low back pain    Arrhythmia    Chronic a-fib (HCC)    Closed fracture of left distal radius    Contact dermatitis 04/16/2017   Depression    Depression    Hypothyroidism    Low back pain    Morbid obesity, BMI unknown ([place])    Osteoarthritis    oa in bilateral knees   Schizophrenia ([place])    Seizures ([place])    Thyroid disease    Varicose veins    Past Surgical History:  Procedure Laterality Date   ACHILLES TENDON REPAIR  approx. 2004   right foot   CATARACT EXTRACTION     bilateral   OPEN REDUCTION INTERNAL FIXATION (ORIF) DISTAL RADIAL FRACTURE Left 07/21/2018   Procedure: OPEN REDUCTION INTERNAL FIXATION (ORIF) DISTAL RADIAL FRACTURE;  Surgeon: [name], MD;  Location: [place];  Service: Orthopedics;  Laterality: Left;   stab phlebectomy  Right 11/09/2016   stab phlebectomy R leg by Tinnie Gens MD   TOOTH EXTRACTION     Patient Active Problem List   Diagnosis Date Noted   Bilateral primary osteoarthritis of hip 08/13/2021   Statin intolerance 06/24/2021   Left knee pain 07/23/2020   Hip pain 01/26/2020   Spinal stenosis of lumbar region 01/16/2020   Chronic lumbar radiculopathy 12/08/2019   Hyperlipidemia associated with type 2 diabetes mellitus ([place]) 12/08/2019   Dizziness 11/03/2019   Paronychia of left index finger  05/19/2019   Fall at home 11/24/2018   Rosacea 10/24/2018   Loss of weight 09/09/2018   Schizoaffective disorder, depressive type ([place]) 04/08/2018   Varicose veins of bilateral lower extremities with other complications 96/78/9381   Erectile dysfunction 04/26/2015   Hypothyroidism    Seizures (HCC)    Persistent atrial fibrillation (HCC)    BMI 37.0-37.9, adult    Acute bilateral low back pain without sciatica    Seborrheic dermatitis 02/10/2007   T2DM (type 2 diabetes mellitus) ([place]) 02/10/2007    PCP: Rise Patience, MD  REFERRING PROVIDER: Rise Patience, MD  REFERRING DIAG:  W19.XXXA,Y92.009 (ICD-10-CM) - Fall in home, initial encounter  M16.0 (ICD-10-CM) - Bilateral primary osteoarthritis of hip    Rationale for Evaluation and Treatment Rehabilitation  THERAPY DIAG:  Other abnormalities of gait and mobility  Muscle weakness (generalized)  Pain in left hip  Pain in right hip  Other low back pain  Chronic pain of left knee  Chronic bilateral low back pain with right-sided sciatica  Chronic pain of right knee  ONSET DATE: fall 1 month ago.  Chronic lumbar and LE pain  SUBJECTIVE:  SUBJECTIVE STATEMENT: I have not heard back from MD about Xray so I am not going to wait and I am going to call bc my hip pain is so bad.   PERTINENT HISTORY:  A-fib, chronic LBP, depression, schizophrenia, tremors from medication  PAIN:  Are you having pain? Yes: NPRS scale: 5/10 Pain location: Lower left back and left buttocks Pain description: stiff, sore Aggravating factors: sitting/driving, walking  Relieving factors: stretching, rest, heating pad   Walking limited to 10 minutes-baseline for this patient   PRECAUTIONS: Fall  WEIGHT BEARING RESTRICTIONS No  FALLS:  Has patient fallen  in last 6 months? Yes. Number of falls 1, fell when descending steps.  PT will address balance in treatment.    LIVING ENVIRONMENT: Lives with: lives alone Lives in: House/apartment Stairs: Yes: External: 12-13  Has following equipment at home: None  OCCUPATION: on disability  PLOF: Independent with basic ADLs  PATIENT GOALS reduce Rt LE pain, improve balance and mobility  OBJECTIVE:   DIAGNOSTIC FINDINGS:  X-ray of hips and low back on 02/09/22: DDD in lumbar spine, otherwise unremarkable   COGNITION:  Overall cognitive status: History of cognitive impairments - at baseline     SENSATION: WFL  MUSCLE LENGTH: Limited by 50% bil.   POSTURE: rounded shoulders, forward head, and flexed trunk   PALPATION: Mild palpable tenderness and trigger points in Rt gluteals and Rt>Lt lumbar spine   LUMBAR ROM:  WFLs, baseline for this patient. Rt lumbar pain reported with A/ROM  LOWER EXTREMITY ROM:    IR limited by 50% bil   LOWER EXTREMITY MMT:   Rt hip 4/5, Lt hip 4+/5, Bil knees 4+/5 with pain reported with testing.  FUNCTIONAL TESTS:  5 times sit to stand: 20 seconds  LEFS: 48.75%  04/06/22: 5 times sit to stand: 18.94 seconds  04/16/22: 16.98 seconds   GAIT: Distance walked: 100 Assistive device utilized: None Level of assistance: Complete Independence Comments: reduced trunk rotation, flexed trunk, ER at bil hips  TODAY/'S TREATMENT   04/20/22: Nustep L5 x 0.5 miles 14 min Seated piriformis stretch 5x with small forward lean LT seated hamstring stretch 3x 20 sec Sit to stand 2x10 each  Seated forward flexion stretch with blue ball 10x and lateral x 5 each LAQ  Bil 10x Power pLAte Bil hamstring stretching 30 sec bouts 3x  04/16/22: Nustep L4x 0.5 miles 14 min, 44 seconds  Seated shoulder 3 ways: 2# x10 bil Sit to stand 2x5 each  Seated forward flexion stretch with blue ball 10x and lateral x 5 each Sidelying Addaday Lt gluteals in sidelying.    04/13/22:  Nustep L2 12 min, 10 seconds .41 miles today with PTA present to discuss status post last visit.  Seated shoulder 3 ways: 2# x10 bil Sit to stand 2x5 each  Seated forward flexion stretch with blue ball 10x and lateral x 5 each Standing hip extension x10 bil  Sidelying Addaday low back, LT glute in sidelying.        PATIENT EDUCATION:  Education details: Access Code: [email or web address] Person educated: Patient Education method: Explanation, Media planner, and Handouts Education comprehension: verbalized understanding and returned demonstration   HOME EXERCISE PROGRAM: Access Code: [email or web address] URL: [email or web address] Date: 03/04/2022 Prepared by: Claiborne Billings  Exercises - Seated Long Arc Quad  - 3 x daily - 7 x weekly - 2 sets - 10 reps - 5 hold - Seated March   - 3 x daily - 7 x weekly - 3 sets - 10 reps -  Seated Heel Toe Raises   - 3 x daily - 7 x weekly - 2 sets - 10 reps - Sit to Stand Without Arm Support  - 2 x daily - 7 x weekly - 1 sets - 10 reps - Seated Hamstring Stretch  - 3 x daily - 7 x weekly - 1 sets - 3 reps - 20 hold  ASSESSMENT:  CLINICAL IMPRESSION:  Pt arrives with Lt gluteal pain. Pt is concerned about pain and plans to call MD for setting up xray since he hasn't heard from them yet. PTA advised pt to try stretching 2x day vs 1x. Pt tolerated seated piriformis stretch well a little hard getting out of it. Added Powerplate hamstring stretching, pt's step length was improved after doing this.   OBJECTIVE IMPAIRMENTS Abnormal gait, decreased activity tolerance, decreased mobility, difficulty walking, decreased strength, increased muscle spasms, impaired flexibility, improper body mechanics, postural dysfunction, and pain.   ACTIVITY LIMITATIONS lifting, sitting, standing, squatting, stairs, dressing, and locomotion level  PARTICIPATION LIMITATIONS: meal prep, cleaning, laundry, and community activity  PERSONAL FACTORS Fitness, Past/current experiences,  and 1-2 comorbidities: depression, chronic pain  are also affecting patient's functional outcome.   REHAB POTENTIAL: Good  CLINICAL DECISION MAKING: Evolving/moderate complexity  EVALUATION COMPLEXITY: Moderate   GOALS: Goals reviewed with patient? Yes  SHORT TERM GOALS: Target date: 04/01/2022  Be independent in initial HEP Baseline: Goal status: met  2.  Improve LEFS to > or = to 60% to improve function Baseline: 48.75 (04/16/22) Goal status: in progress   3.  Report > or = to 30% reduction in LBP and Rt LE pain with ADLs and self-care  Baseline: 70% (04/09/22) Goal status: met    LONG TERM GOALS: Target date: 04/29/2022  Be independent in advanced HEP Baseline:  Goal status: INITIAL  2.  Improve LEFS to > or = to 70% to improve function Baseline: 48.75 (04/16/22) Goal status: In progress  3.  Perform 5x sit to stand in < or = to 14 seconds to reduce falls risk  Baseline: 18.94 seconds  Goal status: In progress   4.  Report > or = to 60% reduction in LBP and Rt LE pain with ADLs and self-care Baseline:  Goal status: INITIAL    PLAN: PT FREQUENCY: 1-2x/week  PT DURATION: 8 weeks  PLANNED INTERVENTIONS: Therapeutic exercises, Therapeutic activity, Neuromuscular re-education, Balance training, Gait training, Patient/Family education, Self Care, Joint mobilization, Stair training, Aquatic Therapy, Dry Needling, Electrical stimulation, Spinal manipulation, Spinal mobilization, Cryotherapy, Moist heat, Taping, Ultrasound, Manual therapy, and Re-evaluation.  PLAN FOR NEXT SESSION: Continue with Powerplate and hamstring stretching encourage mobility, continue to advanced exercises.     Myrene Galas, PTA 04/20/22 2:41 PM    [facility] [address], [place], [place] [address] Phone # [phone] Fax [phone]

## 2022-04-21 DIAGNOSIS — C44319 Basal cell carcinoma of skin of other parts of face: Secondary | ICD-10-CM | POA: Diagnosis not present

## 2022-04-21 DIAGNOSIS — L718 Other rosacea: Secondary | ICD-10-CM | POA: Diagnosis not present

## 2022-04-22 ENCOUNTER — Telehealth (HOSPITAL_COMMUNITY): Payer: Medicare HMO | Admitting: Psychiatry

## 2022-04-23 ENCOUNTER — Ambulatory Visit: Payer: Medicare HMO

## 2022-04-23 ENCOUNTER — Ambulatory Visit (HOSPITAL_COMMUNITY): Payer: Medicare HMO | Admitting: Psychiatry

## 2022-04-23 DIAGNOSIS — M25552 Pain in left hip: Secondary | ICD-10-CM | POA: Diagnosis not present

## 2022-04-23 DIAGNOSIS — R2689 Other abnormalities of gait and mobility: Secondary | ICD-10-CM

## 2022-04-23 DIAGNOSIS — G8929 Other chronic pain: Secondary | ICD-10-CM | POA: Diagnosis not present

## 2022-04-23 DIAGNOSIS — M25562 Pain in left knee: Secondary | ICD-10-CM | POA: Diagnosis not present

## 2022-04-23 DIAGNOSIS — M25561 Pain in right knee: Secondary | ICD-10-CM | POA: Diagnosis not present

## 2022-04-23 DIAGNOSIS — M6281 Muscle weakness (generalized): Secondary | ICD-10-CM | POA: Diagnosis not present

## 2022-04-23 DIAGNOSIS — M25551 Pain in right hip: Secondary | ICD-10-CM

## 2022-04-23 DIAGNOSIS — M5459 Other low back pain: Secondary | ICD-10-CM | POA: Diagnosis not present

## 2022-04-23 DIAGNOSIS — M5441 Lumbago with sciatica, right side: Secondary | ICD-10-CM | POA: Diagnosis not present

## 2022-04-23 NOTE — Therapy (Addendum)
OUTPATIENT PHYSICAL THERAPY THORACOLUMBAR EVALUATION   Patient Name: [name] MRN: [id number] DOB:[birth year], 61 y.o., male Today's Date: 04/23/2022   PT End of Session - 04/23/22 1311     Visit Number 13    Date for PT Re-Evaluation 04/29/22    Authorization Type [employer]    Progress Note Due on Visit 19    PT Start Time 1231    PT Stop Time 1310    PT Time Calculation (min) 39 min    Activity Tolerance Patient tolerated treatment well    Behavior During Therapy WFL for tasks assessed/performed              .        Past Medical History:  Diagnosis Date   A-fib [facility])    Acute right-sided low back pain    Arrhythmia    Chronic a-fib (HCC)    Closed fracture of left distal radius    Contact dermatitis 04/16/2017   Depression    Depression    Hypothyroidism    Low back pain    Morbid obesity, BMI unknown ([place])    Osteoarthritis    oa in bilateral knees   Schizophrenia ([place])    Seizures ([place])    Thyroid disease    Varicose veins    Past Surgical History:  Procedure Laterality Date   ACHILLES TENDON REPAIR  approx. 2004   right foot   CATARACT EXTRACTION     bilateral   OPEN REDUCTION INTERNAL FIXATION (ORIF) DISTAL RADIAL FRACTURE Left 07/21/2018   Procedure: OPEN REDUCTION INTERNAL FIXATION (ORIF) DISTAL RADIAL FRACTURE;  Surgeon: [name], MD;  Location: [place];  Service: Orthopedics;  Laterality: Left;   stab phlebectomy  Right 11/09/2016   stab phlebectomy R leg by Tinnie Gens MD   TOOTH EXTRACTION     Patient Active Problem List   Diagnosis Date Noted   Bilateral primary osteoarthritis of hip 08/13/2021   Statin intolerance 06/24/2021   Left knee pain 07/23/2020   Hip pain 01/26/2020   Spinal stenosis of lumbar region 01/16/2020   Chronic lumbar radiculopathy 12/08/2019   Hyperlipidemia associated with type 2 diabetes mellitus ([place]) 12/08/2019   Dizziness 11/03/2019   Paronychia of left index finger  05/19/2019   Fall at home 11/24/2018   Rosacea 10/24/2018   Loss of weight 09/09/2018   Schizoaffective disorder, depressive type ([place]) 04/08/2018   Varicose veins of bilateral lower extremities with other complications 46/50/3546   Erectile dysfunction 04/26/2015   Hypothyroidism    Seizures (HCC)    Persistent atrial fibrillation (HCC)    BMI 37.0-37.9, adult    Acute bilateral low back pain without sciatica    Seborrheic dermatitis 02/10/2007   T2DM (type 2 diabetes mellitus) ([place]) 02/10/2007    PCP: Rise Patience, MD  REFERRING PROVIDER: Rise Patience, MD  REFERRING DIAG:  W19.XXXA,Y92.009 (ICD-10-CM) - Fall in home, initial encounter  M16.0 (ICD-10-CM) - Bilateral primary osteoarthritis of hip    Rationale for Evaluation and Treatment Rehabilitation  THERAPY DIAG:  Other abnormalities of gait and mobility  Muscle weakness (generalized)  Pain in left hip  Pain in right hip  ONSET DATE: fall 1 month ago.  Chronic lumbar and LE pain  SUBJECTIVE:  SUBJECTIVE STATEMENT: I see the orthopedic MD on Monday.  I am hurting today.    PERTINENT HISTORY:  A-fib, chronic LBP, depression, schizophrenia, tremors from medication  PAIN:  Are you having pain? Yes: NPRS scale: 7/10 Pain location: Lower left back and left buttocks Pain description: stiff, sore Aggravating factors: sitting/driving, walking  Relieving factors: stretching, rest, heating pad   Walking limited to 10 minutes-baseline for this patient   PRECAUTIONS: Fall  WEIGHT BEARING RESTRICTIONS No  FALLS:  Has patient fallen in last 6 months? Yes. Number of falls 1, fell when descending steps.  PT will address balance in treatment.    LIVING ENVIRONMENT: Lives with: lives alone Lives in: House/apartment Stairs: Yes:  External: 12-13  Has following equipment at home: None  OCCUPATION: on disability  PLOF: Independent with basic ADLs  PATIENT GOALS reduce Rt LE pain, improve balance and mobility  OBJECTIVE:   DIAGNOSTIC FINDINGS:  X-ray of hips and low back on 02/09/22: DDD in lumbar spine, otherwise unremarkable   COGNITION:  Overall cognitive status: History of cognitive impairments - at baseline     SENSATION: WFL  MUSCLE LENGTH: Limited by 50% bil.   POSTURE: rounded shoulders, forward head, and flexed trunk   PALPATION: Mild palpable tenderness and trigger points in Rt gluteals and Rt>Lt lumbar spine   LUMBAR ROM:  WFLs, baseline for this patient. Rt lumbar pain reported with A/ROM  LOWER EXTREMITY ROM:    IR limited by 50% bil   LOWER EXTREMITY MMT:   Rt hip 4/5, Lt hip 4+/5, Bil knees 4+/5 with pain reported with testing.  FUNCTIONAL TESTS:  5 times sit to stand: 20 seconds  LEFS: 48.75%  04/06/22: 5 times sit to stand: 18.94 seconds  04/16/22: 16.98 seconds   GAIT: Distance walked: 100 Assistive device utilized: None Level of assistance: Complete Independence Comments: reduced trunk rotation, flexed trunk, ER at bil hips  TODAY/'S TREATMENT  04/23/22: Nustep L5 x 0.5 miles 13 min, 25 seconds min Seated piriformis stretch 5x with small forward lean LT seated hamstring stretch 3x 20 sec Sit to stand 2x10 each  Seated forward flexion stretch with blue ball 10x and lateral x 5 each Power plate Bil hamstring stretching 30 sec bouts 3x Addaday to Lt gluteals x 10 min  04/20/22: Nustep L5 x 0.5 miles 14 min Seated piriformis stretch 5x with small forward lean LT seated hamstring stretch 3x 20 sec Sit to stand 2x10 each  Seated forward flexion stretch with blue ball 10x and lateral x 5 each LAQ  Bil 10x Power pLAte Bil hamstring stretching 30 sec bouts 3x  04/16/22: Nustep L4x 0.5 miles 14 min, 44 seconds  Seated shoulder 3 ways: 2# x10 bil Sit to stand 2x5 each   Seated forward flexion stretch with blue ball 10x and lateral x 5 each Sidelying Addaday Lt gluteals in sidelying.       PATIENT EDUCATION:  Education details: Access Code: [email or web address] Person educated: Patient Education method: Explanation, Media planner, and Handouts Education comprehension: verbalized understanding and returned demonstration   HOME EXERCISE PROGRAM: Access Code: [email or web address] URL: [email or web address] Date: 03/04/2022 Prepared by: Claiborne Billings  Exercises - Seated Long Arc Quad  - 3 x daily - 7 x weekly - 2 sets - 10 reps - 5 hold - Seated March   - 3 x daily - 7 x weekly - 3 sets - 10 reps - Seated Heel Toe Raises   - 3 x daily - 7 x weekly - 2 sets -  10 reps - Sit to Stand Without Arm Support  - 2 x daily - 7 x weekly - 1 sets - 10 reps - Seated Hamstring Stretch  - 3 x daily - 7 x weekly - 1 sets - 3 reps - 20 hold  ASSESSMENT:  CLINICAL IMPRESSION: Pt will see orthopedic MD on Monday for further assessment.  Pt is doing more activity overall in the clinic and denies much change in his pain recently.  Pt did well with exercises in the clinic today.  PT will reassess next visit.  Patient will benefit from skilled PT to address the below impairments and improve overall function.   OBJECTIVE IMPAIRMENTS Abnormal gait, decreased activity tolerance, decreased mobility, difficulty walking, decreased strength, increased muscle spasms, impaired flexibility, improper body mechanics, postural dysfunction, and pain.   ACTIVITY LIMITATIONS lifting, sitting, standing, squatting, stairs, dressing, and locomotion level  PARTICIPATION LIMITATIONS: meal prep, cleaning, laundry, and community activity  PERSONAL FACTORS Fitness, Past/current experiences, and 1-2 comorbidities: depression, chronic pain  are also affecting patient's functional outcome.   REHAB POTENTIAL: Good  CLINICAL DECISION MAKING: Evolving/moderate complexity  EVALUATION COMPLEXITY:  Moderate   GOALS: Goals reviewed with patient? Yes  SHORT TERM GOALS: Target date: 04/01/2022  Be independent in initial HEP Baseline: Goal status: met  2.  Improve LEFS to > or = to 60% to improve function Baseline: 48.75 (04/16/22) Goal status: in progress   3.  Report > or = to 30% reduction in LBP and Rt LE pain with ADLs and self-care  Baseline: 70% (04/09/22) Goal status: met    LONG TERM GOALS: Target date: 04/29/2022  Be independent in advanced HEP Baseline:  Goal status: INITIAL  2.  Improve LEFS to > or = to 70% to improve function Baseline: 48.75 (04/16/22) Goal status: In progress  3.  Perform 5x sit to stand in < or = to 14 seconds to reduce falls risk  Baseline: 18.94 seconds  Goal status: In progress   4.  Report > or = to 60% reduction in LBP and Rt LE pain with ADLs and self-care Baseline:  Goal status: INITIAL    PLAN: PT FREQUENCY: 1-2x/week  PT DURATION: 8 weeks  PLANNED INTERVENTIONS: Therapeutic exercises, Therapeutic activity, Neuromuscular re-education, Balance training, Gait training, Patient/Family education, Self Care, Joint mobilization, Stair training, Aquatic Therapy, Dry Needling, Electrical stimulation, Spinal manipulation, Spinal mobilization, Cryotherapy, Moist heat, Taping, Ultrasound, Manual therapy, and Re-evaluation.  PLAN FOR NEXT SESSION: Continue with Powerplate and hamstring stretching encourage mobility, continue to advanced exercises.     Sigurd Sos, PT 04/23/22 1:11 PM  PHYSICAL THERAPY DISCHARGE SUMMARY  Visits from Start of Care: 13  Current functional level related to goals / functional outcomes: See above for most current PT status.  Pt will follow-up with MD to discuss continued symptoms.      Remaining deficits: See above.  Lt hip pain that limits functional mobility    Education / Equipment: HEP   Patient agrees to discharge. Patient goals were partially met. Patient is being discharged due to being  pleased with the current functional level.  Sigurd Sos, PT 04/27/22 3:51 PM   [facility] [address], [place], [place] [address] Phone # [phone] Fax [phone]

## 2022-04-25 ENCOUNTER — Other Ambulatory Visit (HOSPITAL_COMMUNITY): Payer: Self-pay | Admitting: Psychiatry

## 2022-04-25 DIAGNOSIS — F339 Major depressive disorder, recurrent, unspecified: Secondary | ICD-10-CM

## 2022-04-25 DIAGNOSIS — R251 Tremor, unspecified: Secondary | ICD-10-CM

## 2022-04-27 ENCOUNTER — Ambulatory Visit: Payer: Medicare HMO

## 2022-04-27 ENCOUNTER — Ambulatory Visit
Admission: RE | Admit: 2022-04-27 | Discharge: 2022-04-27 | Disposition: A | Discharge status: Home / Self Care | Payer: Medicare HMO | Source: Ambulatory Visit | Attending: Surgery | Admitting: Surgery

## 2022-04-27 DIAGNOSIS — M545 Low back pain, unspecified: Secondary | ICD-10-CM | POA: Diagnosis not present

## 2022-04-27 DIAGNOSIS — M5416 Radiculopathy, lumbar region: Secondary | ICD-10-CM

## 2022-04-29 ENCOUNTER — Telehealth: Payer: Self-pay

## 2022-04-29 NOTE — Patient Outreach (Signed)
  Care Coordination   04/29/2022 Name: [name] MRN: [id number] DOB: 12-Feb-1961   Care Coordination Outreach Attempts:  An unsuccessful telephone outreach was attempted today to offer the patient information about available care coordination services as a benefit of their health plan.   Follow Up Plan:  Additional outreach attempts will be made to offer the patient care coordination information and services.   Encounter Outcome:  No Answer  Care Coordination Interventions Activated:  No   Care Coordination Interventions:  No, not indicated     Jone Baseman, RN, MSN [facility] Care Management Care Management Coordinator Direct Line [phone]

## 2022-04-29 NOTE — Progress Notes (Signed)
Office Visit Note   Patient: [name]           Date of Birth: 11-Jul-1960           MRN: [id number] Visit Date: 04/15/2022              Requested by: Rise Patience, DO [address] [place],  [place] [address] PCP: Rise Patience, DO   Assessment & Plan: Visit Diagnoses:  1. Chronic lumbar radiculopathy     Plan: The patient's ongoing symptoms I will schedule MRI lumbar spine rule out HNP/stenosis.  Follow with Dr. Lorin Mercy in 3 weeks for recheck to review results and further treatment options.  All questions answered.  Follow-Up Instructions: Return in about 3 weeks (around 05/06/2022) for WITH DR YATES TO REVIEW LUMBAR MRI .   Orders:  Orders Placed This Encounter  Procedures   MR LUMBAR SPINE WO CONTRAST   No orders of the defined types were placed in this encounter.     Procedures: No procedures performed   Clinical Data: No additional findings.   Subjective: Chief Complaint  Patient presents with   Left Hip - Pain    HPI  Review of Systems   Objective: Vital Signs: BP 122/76   Pulse 81   Ht '5\' 10"'$  (1.778 m)   Wt 257 lb 3.2 oz (116.7 kg)   BMI 36.90 kg/m   Physical Exam  Ortho Exam  Specialty Comments:  No specialty comments available.  Imaging: No results found.   PMFS History: Patient Active Problem List   Diagnosis Date Noted   Bilateral primary osteoarthritis of hip 08/13/2021   Statin intolerance 06/24/2021   Left knee pain 07/23/2020   Hip pain 01/26/2020   Spinal stenosis of lumbar region 01/16/2020   Chronic lumbar radiculopathy 12/08/2019   Hyperlipidemia associated with type 2 diabetes mellitus ([place]) 12/08/2019   Dizziness 11/03/2019   Paronychia of left index finger 05/19/2019   Fall at home 11/24/2018   Rosacea 10/24/2018   Loss of weight 09/09/2018   Schizoaffective disorder, depressive type ([place]) 04/08/2018   Varicose veins of bilateral lower extremities with other complications 44/09/4740   Erectile  dysfunction 04/26/2015   Hypothyroidism    Seizures (HCC)    Persistent atrial fibrillation (HCC)    BMI 37.0-37.9, adult    Acute bilateral low back pain without sciatica    Seborrheic dermatitis 02/10/2007   T2DM (type 2 diabetes mellitus) ([place]) 02/10/2007   Past Medical History:  Diagnosis Date   A-fib ([place])    Acute right-sided low back pain    Arrhythmia    Chronic a-fib (HCC)    Closed fracture of left distal radius    Contact dermatitis 04/16/2017   Depression    Depression    Hypothyroidism    Low back pain    Morbid obesity, BMI unknown (HCC)    Osteoarthritis    oa in bilateral knees   Schizophrenia ([place])    Seizures ([place])    Thyroid disease    Varicose veins     Family History  Problem Relation Age of Onset   Diabetes Father    Heart disease Father    Diabetes Brother    OCD Mother     Past Surgical History:  Procedure Laterality Date   ACHILLES TENDON REPAIR  approx. 2004   right foot   CATARACT EXTRACTION     bilateral   OPEN REDUCTION INTERNAL FIXATION (ORIF) DISTAL RADIAL FRACTURE Left 07/21/2018   Procedure:  OPEN REDUCTION INTERNAL FIXATION (ORIF) DISTAL RADIAL FRACTURE;  Surgeon: [name], MD;  Location: [place];  Service: Orthopedics;  Laterality: Left;   stab phlebectomy  Right 11/09/2016   stab phlebectomy R leg by Tinnie Gens MD   TOOTH EXTRACTION     Social History   Occupational History   Occupation: disabled  Tobacco Use   Smoking status: Never   Smokeless tobacco: Never  Vaping Use   Vaping Use: Never used  Substance and Sexual Activity   Alcohol use: No    Alcohol/week: 0.0 standard drinks of alcohol   Drug use: No   Sexual activity: Never

## 2022-04-30 ENCOUNTER — Ambulatory Visit (HOSPITAL_COMMUNITY): Payer: Medicare HMO | Admitting: Psychiatry

## 2022-05-07 ENCOUNTER — Ambulatory Visit (HOSPITAL_BASED_OUTPATIENT_CLINIC_OR_DEPARTMENT_OTHER): Payer: Medicare HMO | Admitting: Psychiatry

## 2022-05-07 ENCOUNTER — Other Ambulatory Visit (HOSPITAL_COMMUNITY): Payer: Self-pay | Admitting: Psychiatry

## 2022-05-07 ENCOUNTER — Encounter (HOSPITAL_COMMUNITY): Payer: Self-pay | Admitting: Psychiatry

## 2022-05-07 DIAGNOSIS — F419 Anxiety disorder, unspecified: Secondary | ICD-10-CM

## 2022-05-07 DIAGNOSIS — F339 Major depressive disorder, recurrent, unspecified: Secondary | ICD-10-CM

## 2022-05-07 DIAGNOSIS — R251 Tremor, unspecified: Secondary | ICD-10-CM

## 2022-05-07 DIAGNOSIS — R69 Illness, unspecified: Secondary | ICD-10-CM | POA: Diagnosis not present

## 2022-05-07 MED ORDER — PHENELZINE SULFATE 15 MG PO TABS: 45.0000 mg | ORAL_TABLET | Freq: Two times a day (BID) | ORAL | 0 refills | Status: DC

## 2022-05-07 MED ORDER — BENZTROPINE MESYLATE 1 MG PO TABS: 1.0000 mg | ORAL_TABLET | Freq: Every day | ORAL | 0 refills | Status: DC

## 2022-05-07 MED ORDER — DIVALPROEX SODIUM 500 MG PO DR TAB: 500.0000 mg | DELAYED_RELEASE_TABLET | Freq: Two times a day (BID) | ORAL | 0 refills | Status: DC

## 2022-05-07 MED ORDER — PERPHENAZINE 8 MG PO TABS: 24.0000 mg | ORAL_TABLET | Freq: Every day | ORAL | 0 refills | Status: DC

## 2022-05-07 NOTE — Progress Notes (Signed)
[name] MD/PA/NP OP Progress Note  05/07/2022 2:46 PM QUINTAVIS BRANDS  MRN:  [id number]  Chief Complaint:  Chief Complaint  Patient presents with   Follow-up   HPI: Patient came for his follow-up appointment in the office.  He reported things are going well and there are no recent issues.  He had a fall 2 months ago when he missed the step and fell but likely no injuries.  He also saw a neurologist to check on his seizures and tremors.  He was told possible medication causing tremors but patient is not interested to change the medication.  He feels his depression is a stable and he does not want to reduce or try a different medication.  He admitted forgetfulness and sometime memory issues but they are chronic.  He does enjoy going to [place] to visit his mother.  He has a season ticket for [employer] and he really enjoys watching the ice hockey.  He reported his energy level is good.  He is wondering if he can get his Eliquis from [place].  I explained it should be given by his primary care doctor.  Patient denies any mania, psychosis, hallucination or any suicidal thoughts.  His appetite is okay.  His weight is stable.  He denies drinking or using any illegal substances.  He denies any major panic attack.  Visit Diagnosis:    ICD-10-CM   1. Anxiety  F41.9 divalproex (DEPAKOTE) 500 MG DR tablet    phenelzine (NARDIL) 15 MG tablet    2. MDD (major depressive disorder), recurrent episode, with atypical features (HCC)  F33.9 divalproex (DEPAKOTE) 500 MG DR tablet    benztropine (COGENTIN) 1 MG tablet    perphenazine (TRILAFON) 8 MG tablet    phenelzine (NARDIL) 15 MG tablet    3. Tremor  R25.1 benztropine (COGENTIN) 1 MG tablet        H/O multiple inpatient from [phone] for severe depression, suicidal thoughts. No h/o suicidal attempt. H/O OCD, schizoaffective disorder and major depression.  Tried numerous medication including TCAs.  We tried Lamictal but he develop a rash.   Past Medical  History:  Past Medical History:  Diagnosis Date   A-fib ([place])    Acute right-sided low back pain    Arrhythmia    Chronic a-fib (HCC)    Closed fracture of left distal radius    Contact dermatitis 04/16/2017   Depression    Depression    Hypothyroidism    Low back pain    Morbid obesity, BMI unknown (HCC)    Osteoarthritis    oa in bilateral knees   Schizophrenia ([place])    Seizures ([place])    Thyroid disease    Varicose veins     Past Surgical History:  Procedure Laterality Date   ACHILLES TENDON REPAIR  approx. 2004   right foot   CATARACT EXTRACTION     bilateral   OPEN REDUCTION INTERNAL FIXATION (ORIF) DISTAL RADIAL FRACTURE Left 07/21/2018   Procedure: OPEN REDUCTION INTERNAL FIXATION (ORIF) DISTAL RADIAL FRACTURE;  Surgeon: [name], MD;  Location: [place];  Service: Orthopedics;  Laterality: Left;   stab phlebectomy  Right 11/09/2016   stab phlebectomy R leg by Tinnie Gens MD   TOOTH EXTRACTION      Family Psychiatric History: Reviewed.  Family History:  Family History  Problem Relation Age of Onset   Diabetes Father    Heart disease Father    Diabetes Brother    OCD Mother  Social History:  Social History   Socioeconomic History   Marital status: Single    Spouse name: Not on file   Number of children: Not on file   Years of education: Not on file   Highest education level: Not on file  Occupational History   Occupation: disabled  Tobacco Use   Smoking status: Never   Smokeless tobacco: Never  Vaping Use   Vaping Use: Never used  Substance and Sexual Activity   Alcohol use: No    Alcohol/week: 0.0 standard drinks of alcohol   Drug use: No   Sexual activity: Never  Other Topics Concern   Not on file  Social History Narrative   Not on file   Social Determinants of Health   Financial Resource Strain: Not on file  Food Insecurity: Not on file  Transportation Needs: Not on file  Physical Activity: Not on file   Stress: Not on file  Social Connections: Not on file    Allergies:  Allergies  Allergen Reactions   Lamictal [Lamotrigine] Rash   Metformin And Related Rash    Metabolic Disorder Labs: Lab Results  Component Value Date   HGBA1C 6.7 09/15/2021   MPG 146 (H) 04/24/2015   MPG 140 02/05/2007   No results found for: "PROLACTIN" Lab Results  Component Value Date   CHOL 194 12/18/2020   TRIG 309 (H) 12/18/2020   HDL 27 (L) 12/18/2020   CHOLHDL 7.2 (H) 12/18/2020   VLDL 60 (H) 07/16/2016   LDLCALC 113 (H) 12/18/2020   LDLCALC 138 (H) 11/02/2019   Lab Results  Component Value Date   TSH 1.330 12/18/2020   TSH 1.960 11/02/2019    Therapeutic Level Labs: No results found for: "LITHIUM" Lab Results  Component Value Date   VALPROATE 48 (L) 01/21/2022   VALPROATE 58 09/15/2021   No results found for: "CBMZ"  Current Medications: Current Outpatient Medications  Medication Sig Dispense Refill   allopurinol (ZYLOPRIM) 300 MG tablet TAKE 1 TABLET BY MOUTH EVERY DAY 90 tablet 1   atorvastatin (LIPITOR) 40 MG tablet TAKE 1 TABLET BY MOUTH EVERY DAY 90 tablet 1   benztropine (COGENTIN) 1 MG tablet Take 1 tablet (1 mg total) by mouth daily. 90 tablet 0   divalproex (DEPAKOTE) 500 MG DR tablet Take 1 tablet (500 mg total) by mouth 2 (two) times daily. 180 tablet 0   ELIQUIS 5 MG TABS tablet TAKE 1 TABLET BY MOUTH TWICE A DAY 60 tablet 11   HYDROcodone-acetaminophen (NORCO) 5-325 MG tablet Take 1 tablet by mouth every 6 (six) hours as needed for moderate pain. 30 tablet 0   levothyroxine (SYNTHROID) 100 MCG tablet Take 1 tablet (100 mcg total) by mouth daily. 90 tablet 3   lidocaine (LIDODERM) 5 % Place 1 patch onto the skin daily. Remove & Discard patch within 12 hours or as directed by MD (Patient not taking: Reported on 04/02/2022) 30 patch 0   ONETOUCH VERIO test strip TEST UP TO 4 TIMES A DAY (Patient not taking: Reported on 03/05/2022) 100 strip 2   perphenazine (TRILAFON) 8 MG  tablet Take 3 tablets (24 mg total) by mouth at bedtime. 270 tablet 0   phenelzine (NARDIL) 15 MG tablet Take 3 tablets (45 mg total) by mouth 2 (two) times daily. 540 tablet 0   sildenafil (VIAGRA) 25 MG tablet Take 1 tablet (25 mg total) by mouth daily as needed for erectile dysfunction. 10 tablet 0   No current facility-administered medications for this  visit.     Musculoskeletal: Strength & Muscle Tone: decreased Gait & Station: unsteady Patient leans: Right and Left  Psychiatric Specialty Exam: Review of Systems  Blood pressure 134/82, pulse 80, resp. rate 18, height '5\' 10"'$  (1.778 m), weight 258 lb 3.2 oz (117.1 kg), SpO2 94 %.There is no height or weight on file to calculate BMI.  General Appearance: Fairly Groomed  Eye Contact:  Fair  Speech:  Slow  Volume:  Decreased  Mood:  Euthymic  Affect:  Constricted  Thought Process:  Descriptions of Associations: Intact  Orientation:  Full (Time, Place, and Person)  Thought Content: WDL   Suicidal Thoughts:  No  Homicidal Thoughts:  No  Memory:  Immediate;   Fair Recent;   Fair Remote;   Fair  Judgement:  Fair  Insight:  Present  Psychomotor Activity:  Tremor  Concentration:  Concentration: Fair and Attention Span: Fair  Recall:  [employer] of Knowledge: Fair  Language: Fair  Akathisia:  No  Handed:  Right  AIMS (if indicated): not done  Assets:  Communication Skills Desire for Improvement Housing Transportation  ADL's:  Intact  Cognition: Impaired,  Mild  Sleep:  Good   Screenings: PHQ2-9    [place] Visit from 04/02/2022 in [facility] Office Visit from 03/02/2022 in [facility] Office Visit from 11/17/2021 in [facility] Office Visit from 09/15/2021 in [facility] Office Visit from 07/30/2021 in [place]  PHQ-2 Total Score 3 2 0 0 1  PHQ-9 Total Score 3 5 0 1 6      [facility] ED from  12/21/2021 in [facility] ED from 09/17/2021 in [place] Video Visit from 12/06/2020 in [facility]  C-SSRS RISK CATEGORY No Risk No Risk No Risk        Assessment and Plan: Major depressive disorder, recurrent.  Anxiety.  Tremors.  Review notes from other providers.  Review last Depakote level which was 48.  I discussed the level with the patient.  Despite low level patient doing well and does not want to increase or change the medication.  He has chronic tremors and he takes Cogentin but does not want to reduce the other medication.  I will suggest to contact his PCP for his Eliquis refill.  I will also forward my note to his PCP.  Continue Trilafon 24 mg at bedtime, Cogentin 1 mg daily, Depakote 500 mg 2 times a day and Nardil 45 mg 2 times a day.  Recommend to call us back if is any question or any concern.  Patient is not interested in therapy.  Follow-up in 3 months.  Collaboration of Care: Collaboration of Care: Other provider involved in patient's care AEB notes are available in epic to review.  Patient/Guardian was advised Release of Information must be obtained prior to any record release in order to collaborate their care with an outside provider. Patient/Guardian was advised if they have not already done so to contact the registration department to sign all necessary forms in order for [place] to release information regarding their care.   Consent: Patient/Guardian gives verbal consent for treatment and assignment of benefits for services provided during this visit. Patient/Guardian expressed understanding and agreed to proceed.    Kathlee Nations, MD 05/07/2022, 2:46 PM

## 2022-05-12 ENCOUNTER — Ambulatory Visit (INDEPENDENT_AMBULATORY_CARE_PROVIDER_SITE_OTHER): Payer: Medicare HMO | Admitting: Orthopaedic Surgery

## 2022-05-12 ENCOUNTER — Encounter: Payer: Self-pay | Admitting: Orthopaedic Surgery

## 2022-05-12 VITALS — BP 120/79 | HR 70 | Ht 70.0 in | Wt 258.0 lb

## 2022-05-12 DIAGNOSIS — M4810 Ankylosing hyperostosis [Forestier], site unspecified: Secondary | ICD-10-CM | POA: Diagnosis not present

## 2022-05-12 NOTE — Progress Notes (Signed)
Office Visit Note   Patient: [name]           Date of Birth: July 09, 1960           MRN: [id number] Visit Date: 05/12/2022              Requested by: Rise Patience, DO [address] [place],  [place] [address] PCP: Rise Patience, DO   Assessment & Plan: Visit Diagnoses:  1. DISH (diffuse idiopathic skeletal hyperostosis)     Plan: We discussed daily stretching program to try to maintain what motion he has.  Walking program gradual weight loss.  If he develops progressive hip problems he can return.  Follow-Up Instructions: No follow-ups on file.   Orders:  No orders of the defined types were placed in this encounter.  No orders of the defined types were placed in this encounter.     Procedures: No procedures performed   Clinical Data: No additional findings.   Subjective: Chief Complaint  Patient presents with   Lower Back - Pain, Follow-up    MRI lumbar review    HPI 61 year old male with DISH post lumbar MRI.  Patient states she sat on heating pad a little bit too long.  He is taken Tylenol Extra Strength with some relief.  He has noted stiffness great difficulty bending down and picking something off the floor he is to grab hold of something with 1 hand and then bend his knee and bend his hip joint.  Patient has mild tremor has small vessel disease changes on head CT scan.  He has had cervical radiographs which shows DISH with multilevel bridging spurs anteriorly in the cervical spine.  Additionally schizoaffective disorder depression type.  Patient states he is on his own and has a large number retired people around him.  He states they have discussed starting a walking program which she is thinking about joining.  Review of Systems positive type 2 diabetes.  BMI 37.  All other systems noncontributory other than as mentioned above.   Objective: Vital Signs: BP 120/79   Pulse 70   Ht '5\' 10"'$  (1.778 m)   Wt 258 lb (117 kg)   BMI 37.02 kg/m   Physical  Exam Constitutional:      Appearance: He is well-developed.  HENT:     Head: Normocephalic and atraumatic.     Right Ear: External ear normal.     Left Ear: External ear normal.  Eyes:     Pupils: Pupils are equal, round, and reactive to light.  Neck:     Thyroid: No thyromegaly.     Trachea: No tracheal deviation.  Cardiovascular:     Rate and Rhythm: Normal rate.  Pulmonary:     Effort: Pulmonary effort is normal.     Breath sounds: No wheezing.  Abdominal:     General: Bowel sounds are normal.     Palpations: Abdomen is soft.  Musculoskeletal:     Cervical back: Neck supple.  Skin:    General: Skin is warm and dry.     Capillary Refill: Capillary refill takes less than 2 seconds.  Neurological:     Mental Status: He is alert and oriented to person, place, and time.  Psychiatric:        Behavior: Behavior normal.        Thought Content: Thought content normal.        Judgment: Judgment normal.     Ortho Exam patient ambulates without any walking  aids.  Limited lumbar flexion extension lateral bending as well as rotation consistent with DISH.  Sensation intact of the thighs.  Decreased hip range of motion.  Specialty Comments:  No specialty comments available.  Imaging: CLINICAL DATA:  Low back pain, symptoms persist with greater than 6 weeks of treatment. Spondyloarthropathy. Fell down the stairs 6 months ago with worsening pain.   EXAM: MRI LUMBAR SPINE WITHOUT CONTRAST   TECHNIQUE: Multiplanar, multisequence MR imaging of the lumbar spine was performed. No intravenous contrast was administered.   COMPARISON:  Radiography 02/09/2022.  MRI 12/19/2019.   FINDINGS: Segmentation:  5 lumbar type vertebral bodies.   Alignment:  Normal   Vertebrae: No evidence of compression fracture. Benign appearing hemangioma within the right side of the L3 vertebral body. Sequela of diffuse idiopathic skeletal hyperostosis with exuberant solid bridging anterior osteophytes  from T11-L5 seen chronically. There is active bone creation anteriorly at L5-S1 which was not present in 2021 and is quite likely to be symptomatic. This includes some reactive marrow edema within the S1 vertebral body as well as soft tissue edema adjacent to the calcifying/ossifying material anteriorly.   Conus medullaris and cauda equina: Conus extends to the L1 level. Conus and cauda equina appear normal.   Paraspinal and other soft tissues: Otherwise negative   Disc levels:   As noted above, solid bridging anterior osteophytes beginning at the T11 level extending to L5. No disc degeneration, bulge or herniation at T11-12, T12-L1 or L1-2. No stenosis.   At L2-3, there is no disc bulge or herniation. There is facet and ligamentous hypertrophy worse on the left than the right, encroaching upon the spinal canal. There would be potential for neural compression at the proximal foramina, worse on the left than the right.   L3-4: Mild annular bulging. Ankylosis of the facet joints. No compressive stenosis.   L4-5: Mild annular bulging. Ankylosis of the facet joints. No compressive stenosis.   L5-S1: See above discussion regarding active phase of dish anteriorly with massive developing bridging calcification and associated edema quite likely to be painful. The disc bulges mildly. There is bilateral facet arthropathy with mild edema. No canal or foraminal stenosis however.   IMPRESSION: 1. Sequela of diffuse idiopathic skeletal hyperostosis with exuberant solid bridging anterior osteophytes beginning at the T11 level extending to the L5 level. There is active bone formation/bridging anteriorly at the L5-S1 level with massive bridging calcification and associated edema. This is newly seen since 2021. Associated bone marrow edema affecting the S1 vertebral body and adjacent soft tissue edema in the retroperitoneum. This is quite likely to be symptomatic. There is also bilateral  facet arthropathy at this level with mild edema. 2. L2-3: Facet and ligamentous hypertrophy worse on the left than the right. No disc bulge or herniation. Mild canal narrowing. Potential for neural compression at the proximal foramina, worse on the left than the right.     Electronically Signed   By: Nelson Chimes M.D.   On: 04/28/2022 14:32   PMFS History: Patient Active Problem List   Diagnosis Date Noted   DISH (diffuse idiopathic skeletal hyperostosis) 05/12/2022   Bilateral primary osteoarthritis of hip 08/13/2021   Statin intolerance 06/24/2021   Left knee pain 07/23/2020   Hip pain 01/26/2020   Spinal stenosis of lumbar region 01/16/2020   Chronic lumbar radiculopathy 12/08/2019   Hyperlipidemia associated with type 2 diabetes mellitus ([place]) 12/08/2019   Dizziness 11/03/2019   Paronychia of left index finger 05/19/2019   Fall at  home 11/24/2018   Rosacea 10/24/2018   Loss of weight 09/09/2018   Schizoaffective disorder, depressive type ([facility] [facility]) 04/08/2018   Varicose veins of bilateral lower extremities with other complications 76/72/0947   Erectile dysfunction 04/26/2015   Hypothyroidism    Seizures (HCC)    Persistent atrial fibrillation (HCC)    BMI 37.0-37.9, adult    Acute bilateral low back pain without sciatica    Seborrheic dermatitis 02/10/2007   T2DM (type 2 diabetes mellitus) ([place] [facility]) 02/10/2007   Past Medical History:  Diagnosis Date   A-fib ([place])    Acute right-sided low back pain    Arrhythmia    Chronic a-fib (HCC)    Closed fracture of left distal radius    Contact dermatitis 04/16/2017   Depression    Depression    Hypothyroidism    Low back pain    Morbid obesity, BMI unknown (HCC)    Osteoarthritis    oa in bilateral knees   Schizophrenia ([place])    Seizures ([place])    Thyroid disease    Varicose veins     Family History  Problem Relation Age of Onset   Diabetes Father    Heart disease Father    Diabetes Brother    OCD Mother     Past  Surgical History:  Procedure Laterality Date   ACHILLES TENDON REPAIR  approx. 2004   right foot   CATARACT EXTRACTION     bilateral   OPEN REDUCTION INTERNAL FIXATION (ORIF) DISTAL RADIAL FRACTURE Left 07/21/2018   Procedure: OPEN REDUCTION INTERNAL FIXATION (ORIF) DISTAL RADIAL FRACTURE;  Surgeon: [name], MD;  Location: [facility];  Service: Orthopedics;  Laterality: Left;   stab phlebectomy  Right 11/09/2016   stab phlebectomy R leg by Tinnie Gens MD   TOOTH EXTRACTION     Social History   Occupational History   Occupation: disabled  Tobacco Use   Smoking status: Never   Smokeless tobacco: Never  Vaping Use   Vaping Use: Never used  Substance and Sexual Activity   Alcohol use: No    Alcohol/week: 0.0 standard drinks of alcohol   Drug use: No   Sexual activity: Never

## 2022-05-18 ENCOUNTER — Telehealth: Payer: Self-pay

## 2022-05-18 ENCOUNTER — Telehealth (HOSPITAL_COMMUNITY): Payer: Self-pay

## 2022-05-18 MED ORDER — DIVALPROEX SODIUM ER 500 MG PO TB24: 500.0000 mg | ORAL_TABLET | Freq: Two times a day (BID) | ORAL | 1 refills | Status: DC

## 2022-05-18 NOTE — Patient Outreach (Signed)
  Care Coordination   05/18/2022 Name: [name] MRN: [id number] DOB: [birth year]   Care Coordination Outreach Attempts:  A second unsuccessful outreach was attempted today to offer the patient with information about available care coordination services as a benefit of their health plan.     Follow Up Plan:  Additional outreach attempts will be made to offer the patient care coordination information and services.   Encounter Outcome:  No Answer  Care Coordination Interventions Activated:  No   Care Coordination Interventions:  No, not indicated    Jone Baseman, RN, MSN [facility] Care Management Care Management Coordinator Direct Line [phone]

## 2022-05-18 NOTE — Telephone Encounter (Signed)
Medication problem - Patient left a message for Dr. Adele Schilder that he needed [facility] as regular Dep[facility]ote was sent and is incorrect. Patient returns next on 08/13/22 and order was sent when seen 05/07/22.

## 2022-05-18 NOTE — Telephone Encounter (Signed)
Medication management - Telephone call with patient to inform Dr. Adele Schilder [facility] in his [facility] [address] and patient to call back if any issues filling and making sure it was the extended release version.

## 2022-05-18 NOTE — Telephone Encounter (Signed)
It is changed to ER.

## 2022-05-21 ENCOUNTER — Telehealth (HOSPITAL_COMMUNITY): Payer: Self-pay | Admitting: *Deleted

## 2022-05-21 ENCOUNTER — Other Ambulatory Visit (HOSPITAL_COMMUNITY): Payer: Self-pay | Admitting: *Deleted

## 2022-05-21 NOTE — Telephone Encounter (Signed)
Opened in error

## 2022-05-21 NOTE — Telephone Encounter (Signed)
Pt's mother, Hinton Dyer, called stating that she's worried about pt memory loss which she says is getting worse. Pt is noted to have chronic memory loss and has been seen at [facility] for seizures and tremors. Referrals will be sent to [facility]. Pt next scheduled appointment is on 09/02/22.

## 2022-06-03 ENCOUNTER — Telehealth: Payer: Self-pay

## 2022-06-03 NOTE — Patient Outreach (Signed)
  Care Coordination   06/03/2022 Name: [name] MRN: [id number] DOB: 12/31/1960   Care Coordination Outreach Attempts:  A third unsuccessful outreach was attempted today to offer the patient with information about available care coordination services as a benefit of their health plan.   Follow Up Plan:  No further outreach attempts will be made at this time. We have been unable to contact the patient to offer or enroll patient in care coordination services  Encounter Outcome:  No Answer   Care Coordination Interventions:  No, not indicated    Jone Baseman, RN, MSN [place] Management Care Management Coordinator Direct Line [phone]

## 2022-06-07 DIAGNOSIS — Z79899 Other long term (current) drug therapy: Secondary | ICD-10-CM | POA: Diagnosis not present

## 2022-06-07 DIAGNOSIS — R531 Weakness: Secondary | ICD-10-CM | POA: Diagnosis not present

## 2022-06-07 DIAGNOSIS — R442 Other hallucinations: Secondary | ICD-10-CM | POA: Diagnosis not present

## 2022-06-07 DIAGNOSIS — R410 Disorientation, unspecified: Secondary | ICD-10-CM | POA: Diagnosis not present

## 2022-06-07 DIAGNOSIS — I10 Essential (primary) hypertension: Secondary | ICD-10-CM | POA: Diagnosis not present

## 2022-06-07 DIAGNOSIS — I4891 Unspecified atrial fibrillation: Secondary | ICD-10-CM | POA: Diagnosis not present

## 2022-06-07 DIAGNOSIS — W19XXXA Unspecified fall, initial encounter: Secondary | ICD-10-CM | POA: Diagnosis not present

## 2022-06-07 DIAGNOSIS — W06XXXA Fall from bed, initial encounter: Secondary | ICD-10-CM | POA: Diagnosis not present

## 2022-06-07 DIAGNOSIS — Z79890 Hormone replacement therapy (postmenopausal): Secondary | ICD-10-CM | POA: Diagnosis not present

## 2022-06-07 DIAGNOSIS — Z7982 Long term (current) use of aspirin: Secondary | ICD-10-CM | POA: Diagnosis not present

## 2022-06-07 DIAGNOSIS — R627 Adult failure to thrive: Secondary | ICD-10-CM | POA: Diagnosis not present

## 2022-06-08 DIAGNOSIS — R569 Unspecified convulsions: Secondary | ICD-10-CM | POA: Diagnosis not present

## 2022-06-08 DIAGNOSIS — Z9181 History of falling: Secondary | ICD-10-CM | POA: Diagnosis not present

## 2022-06-08 DIAGNOSIS — I4891 Unspecified atrial fibrillation: Secondary | ICD-10-CM | POA: Diagnosis not present

## 2022-06-08 DIAGNOSIS — E039 Hypothyroidism, unspecified: Secondary | ICD-10-CM | POA: Diagnosis not present

## 2022-06-08 DIAGNOSIS — Z7901 Long term (current) use of anticoagulants: Secondary | ICD-10-CM | POA: Diagnosis not present

## 2022-06-08 DIAGNOSIS — M549 Dorsalgia, unspecified: Secondary | ICD-10-CM | POA: Diagnosis not present

## 2022-06-08 DIAGNOSIS — Z6835 Body mass index (BMI) 35.0-35.9, adult: Secondary | ICD-10-CM | POA: Diagnosis not present

## 2022-06-08 DIAGNOSIS — Z556 Problems related to health literacy: Secondary | ICD-10-CM | POA: Diagnosis not present

## 2022-06-08 DIAGNOSIS — R69 Illness, unspecified: Secondary | ICD-10-CM | POA: Diagnosis not present

## 2022-06-08 DIAGNOSIS — M109 Gout, unspecified: Secondary | ICD-10-CM | POA: Diagnosis not present

## 2022-06-08 DIAGNOSIS — R627 Adult failure to thrive: Secondary | ICD-10-CM | POA: Diagnosis not present

## 2022-06-08 DIAGNOSIS — I10 Essential (primary) hypertension: Secondary | ICD-10-CM | POA: Diagnosis not present

## 2022-06-08 DIAGNOSIS — L259 Unspecified contact dermatitis, unspecified cause: Secondary | ICD-10-CM | POA: Diagnosis not present

## 2022-06-08 DIAGNOSIS — Z85828 Personal history of other malignant neoplasm of skin: Secondary | ICD-10-CM | POA: Diagnosis not present

## 2022-06-08 DIAGNOSIS — M170 Bilateral primary osteoarthritis of knee: Secondary | ICD-10-CM | POA: Diagnosis not present

## 2022-06-08 DIAGNOSIS — G8929 Other chronic pain: Secondary | ICD-10-CM | POA: Diagnosis not present

## 2022-06-09 ENCOUNTER — Telehealth: Payer: Self-pay

## 2022-06-09 ENCOUNTER — Other Ambulatory Visit (HOSPITAL_COMMUNITY): Payer: Self-pay | Admitting: Psychiatry

## 2022-06-09 DIAGNOSIS — I4891 Unspecified atrial fibrillation: Secondary | ICD-10-CM | POA: Diagnosis not present

## 2022-06-09 DIAGNOSIS — L259 Unspecified contact dermatitis, unspecified cause: Secondary | ICD-10-CM | POA: Diagnosis not present

## 2022-06-09 DIAGNOSIS — Z556 Problems related to health literacy: Secondary | ICD-10-CM | POA: Diagnosis not present

## 2022-06-09 DIAGNOSIS — Z9181 History of falling: Secondary | ICD-10-CM | POA: Diagnosis not present

## 2022-06-09 DIAGNOSIS — M170 Bilateral primary osteoarthritis of knee: Secondary | ICD-10-CM | POA: Diagnosis not present

## 2022-06-09 DIAGNOSIS — R627 Adult failure to thrive: Secondary | ICD-10-CM | POA: Diagnosis not present

## 2022-06-09 DIAGNOSIS — M549 Dorsalgia, unspecified: Secondary | ICD-10-CM | POA: Diagnosis not present

## 2022-06-09 DIAGNOSIS — Z6835 Body mass index (BMI) 35.0-35.9, adult: Secondary | ICD-10-CM | POA: Diagnosis not present

## 2022-06-09 DIAGNOSIS — G8929 Other chronic pain: Secondary | ICD-10-CM | POA: Diagnosis not present

## 2022-06-09 DIAGNOSIS — R69 Illness, unspecified: Secondary | ICD-10-CM | POA: Diagnosis not present

## 2022-06-09 DIAGNOSIS — Z85828 Personal history of other malignant neoplasm of skin: Secondary | ICD-10-CM | POA: Diagnosis not present

## 2022-06-09 DIAGNOSIS — I10 Essential (primary) hypertension: Secondary | ICD-10-CM | POA: Diagnosis not present

## 2022-06-09 DIAGNOSIS — R569 Unspecified convulsions: Secondary | ICD-10-CM | POA: Diagnosis not present

## 2022-06-09 DIAGNOSIS — E039 Hypothyroidism, unspecified: Secondary | ICD-10-CM | POA: Diagnosis not present

## 2022-06-09 DIAGNOSIS — M109 Gout, unspecified: Secondary | ICD-10-CM | POA: Diagnosis not present

## 2022-06-09 DIAGNOSIS — Z7901 Long term (current) use of anticoagulants: Secondary | ICD-10-CM | POA: Diagnosis not present

## 2022-06-09 NOTE — Telephone Encounter (Signed)
[name] [facility] PT calls nurse line requesting verbal orders for [facility] PT as follows.   2x a week for 2 weeks  1x a week for 6 weeks   Verbal order given.

## 2022-06-12 ENCOUNTER — Other Ambulatory Visit: Payer: Self-pay | Admitting: Family Medicine

## 2022-06-12 ENCOUNTER — Other Ambulatory Visit (HOSPITAL_COMMUNITY): Payer: Self-pay | Admitting: Psychiatry

## 2022-06-12 DIAGNOSIS — E039 Hypothyroidism, unspecified: Secondary | ICD-10-CM | POA: Diagnosis not present

## 2022-06-12 DIAGNOSIS — Z556 Problems related to health literacy: Secondary | ICD-10-CM | POA: Diagnosis not present

## 2022-06-12 DIAGNOSIS — G8929 Other chronic pain: Secondary | ICD-10-CM | POA: Diagnosis not present

## 2022-06-12 DIAGNOSIS — I10 Essential (primary) hypertension: Secondary | ICD-10-CM | POA: Diagnosis not present

## 2022-06-12 DIAGNOSIS — Z85828 Personal history of other malignant neoplasm of skin: Secondary | ICD-10-CM | POA: Diagnosis not present

## 2022-06-12 DIAGNOSIS — F339 Major depressive disorder, recurrent, unspecified: Secondary | ICD-10-CM

## 2022-06-12 DIAGNOSIS — Z7901 Long term (current) use of anticoagulants: Secondary | ICD-10-CM | POA: Diagnosis not present

## 2022-06-12 DIAGNOSIS — R569 Unspecified convulsions: Secondary | ICD-10-CM | POA: Diagnosis not present

## 2022-06-12 DIAGNOSIS — R627 Adult failure to thrive: Secondary | ICD-10-CM | POA: Diagnosis not present

## 2022-06-12 DIAGNOSIS — F419 Anxiety disorder, unspecified: Secondary | ICD-10-CM

## 2022-06-12 DIAGNOSIS — Z9181 History of falling: Secondary | ICD-10-CM | POA: Diagnosis not present

## 2022-06-12 DIAGNOSIS — L259 Unspecified contact dermatitis, unspecified cause: Secondary | ICD-10-CM | POA: Diagnosis not present

## 2022-06-12 DIAGNOSIS — M170 Bilateral primary osteoarthritis of knee: Secondary | ICD-10-CM | POA: Diagnosis not present

## 2022-06-12 DIAGNOSIS — M549 Dorsalgia, unspecified: Secondary | ICD-10-CM | POA: Diagnosis not present

## 2022-06-12 DIAGNOSIS — R251 Tremor, unspecified: Secondary | ICD-10-CM

## 2022-06-12 DIAGNOSIS — Z6835 Body mass index (BMI) 35.0-35.9, adult: Secondary | ICD-10-CM | POA: Diagnosis not present

## 2022-06-12 DIAGNOSIS — R69 Illness, unspecified: Secondary | ICD-10-CM | POA: Diagnosis not present

## 2022-06-12 DIAGNOSIS — M109 Gout, unspecified: Secondary | ICD-10-CM | POA: Diagnosis not present

## 2022-06-12 DIAGNOSIS — I4891 Unspecified atrial fibrillation: Secondary | ICD-10-CM | POA: Diagnosis not present

## 2022-06-15 DIAGNOSIS — M170 Bilateral primary osteoarthritis of knee: Secondary | ICD-10-CM | POA: Diagnosis not present

## 2022-06-15 DIAGNOSIS — R627 Adult failure to thrive: Secondary | ICD-10-CM | POA: Diagnosis not present

## 2022-06-15 DIAGNOSIS — Z7901 Long term (current) use of anticoagulants: Secondary | ICD-10-CM | POA: Diagnosis not present

## 2022-06-15 DIAGNOSIS — I10 Essential (primary) hypertension: Secondary | ICD-10-CM | POA: Diagnosis not present

## 2022-06-15 DIAGNOSIS — M549 Dorsalgia, unspecified: Secondary | ICD-10-CM | POA: Diagnosis not present

## 2022-06-15 DIAGNOSIS — L259 Unspecified contact dermatitis, unspecified cause: Secondary | ICD-10-CM | POA: Diagnosis not present

## 2022-06-15 DIAGNOSIS — Z556 Problems related to health literacy: Secondary | ICD-10-CM | POA: Diagnosis not present

## 2022-06-15 DIAGNOSIS — G8929 Other chronic pain: Secondary | ICD-10-CM | POA: Diagnosis not present

## 2022-06-15 DIAGNOSIS — Z6835 Body mass index (BMI) 35.0-35.9, adult: Secondary | ICD-10-CM | POA: Diagnosis not present

## 2022-06-15 DIAGNOSIS — E039 Hypothyroidism, unspecified: Secondary | ICD-10-CM | POA: Diagnosis not present

## 2022-06-15 DIAGNOSIS — R569 Unspecified convulsions: Secondary | ICD-10-CM | POA: Diagnosis not present

## 2022-06-15 DIAGNOSIS — R69 Illness, unspecified: Secondary | ICD-10-CM | POA: Diagnosis not present

## 2022-06-15 DIAGNOSIS — M109 Gout, unspecified: Secondary | ICD-10-CM | POA: Diagnosis not present

## 2022-06-15 DIAGNOSIS — Z9181 History of falling: Secondary | ICD-10-CM | POA: Diagnosis not present

## 2022-06-15 DIAGNOSIS — Z85828 Personal history of other malignant neoplasm of skin: Secondary | ICD-10-CM | POA: Diagnosis not present

## 2022-06-15 DIAGNOSIS — I4891 Unspecified atrial fibrillation: Secondary | ICD-10-CM | POA: Diagnosis not present

## 2022-06-16 DIAGNOSIS — I10 Essential (primary) hypertension: Secondary | ICD-10-CM | POA: Diagnosis not present

## 2022-06-16 DIAGNOSIS — Z85828 Personal history of other malignant neoplasm of skin: Secondary | ICD-10-CM | POA: Diagnosis not present

## 2022-06-16 DIAGNOSIS — M170 Bilateral primary osteoarthritis of knee: Secondary | ICD-10-CM | POA: Diagnosis not present

## 2022-06-16 DIAGNOSIS — G8929 Other chronic pain: Secondary | ICD-10-CM | POA: Diagnosis not present

## 2022-06-16 DIAGNOSIS — M549 Dorsalgia, unspecified: Secondary | ICD-10-CM | POA: Diagnosis not present

## 2022-06-16 DIAGNOSIS — Z7901 Long term (current) use of anticoagulants: Secondary | ICD-10-CM | POA: Diagnosis not present

## 2022-06-16 DIAGNOSIS — Z9181 History of falling: Secondary | ICD-10-CM | POA: Diagnosis not present

## 2022-06-16 DIAGNOSIS — R569 Unspecified convulsions: Secondary | ICD-10-CM | POA: Diagnosis not present

## 2022-06-16 DIAGNOSIS — R627 Adult failure to thrive: Secondary | ICD-10-CM | POA: Diagnosis not present

## 2022-06-16 DIAGNOSIS — E039 Hypothyroidism, unspecified: Secondary | ICD-10-CM | POA: Diagnosis not present

## 2022-06-16 DIAGNOSIS — I4891 Unspecified atrial fibrillation: Secondary | ICD-10-CM | POA: Diagnosis not present

## 2022-06-16 DIAGNOSIS — Z6835 Body mass index (BMI) 35.0-35.9, adult: Secondary | ICD-10-CM | POA: Diagnosis not present

## 2022-06-16 DIAGNOSIS — Z556 Problems related to health literacy: Secondary | ICD-10-CM | POA: Diagnosis not present

## 2022-06-16 DIAGNOSIS — R69 Illness, unspecified: Secondary | ICD-10-CM | POA: Diagnosis not present

## 2022-06-16 DIAGNOSIS — M109 Gout, unspecified: Secondary | ICD-10-CM | POA: Diagnosis not present

## 2022-06-16 DIAGNOSIS — L259 Unspecified contact dermatitis, unspecified cause: Secondary | ICD-10-CM | POA: Diagnosis not present

## 2022-06-17 DIAGNOSIS — Z556 Problems related to health literacy: Secondary | ICD-10-CM | POA: Diagnosis not present

## 2022-06-17 DIAGNOSIS — Z85828 Personal history of other malignant neoplasm of skin: Secondary | ICD-10-CM | POA: Diagnosis not present

## 2022-06-17 DIAGNOSIS — L259 Unspecified contact dermatitis, unspecified cause: Secondary | ICD-10-CM | POA: Diagnosis not present

## 2022-06-17 DIAGNOSIS — R627 Adult failure to thrive: Secondary | ICD-10-CM | POA: Diagnosis not present

## 2022-06-17 DIAGNOSIS — I4891 Unspecified atrial fibrillation: Secondary | ICD-10-CM | POA: Diagnosis not present

## 2022-06-17 DIAGNOSIS — R69 Illness, unspecified: Secondary | ICD-10-CM | POA: Diagnosis not present

## 2022-06-17 DIAGNOSIS — Z9181 History of falling: Secondary | ICD-10-CM | POA: Diagnosis not present

## 2022-06-17 DIAGNOSIS — M109 Gout, unspecified: Secondary | ICD-10-CM | POA: Diagnosis not present

## 2022-06-17 DIAGNOSIS — E039 Hypothyroidism, unspecified: Secondary | ICD-10-CM | POA: Diagnosis not present

## 2022-06-17 DIAGNOSIS — Z7901 Long term (current) use of anticoagulants: Secondary | ICD-10-CM | POA: Diagnosis not present

## 2022-06-17 DIAGNOSIS — M170 Bilateral primary osteoarthritis of knee: Secondary | ICD-10-CM | POA: Diagnosis not present

## 2022-06-17 DIAGNOSIS — M549 Dorsalgia, unspecified: Secondary | ICD-10-CM | POA: Diagnosis not present

## 2022-06-17 DIAGNOSIS — R569 Unspecified convulsions: Secondary | ICD-10-CM | POA: Diagnosis not present

## 2022-06-17 DIAGNOSIS — I10 Essential (primary) hypertension: Secondary | ICD-10-CM | POA: Diagnosis not present

## 2022-06-17 DIAGNOSIS — G8929 Other chronic pain: Secondary | ICD-10-CM | POA: Diagnosis not present

## 2022-06-17 DIAGNOSIS — Z6835 Body mass index (BMI) 35.0-35.9, adult: Secondary | ICD-10-CM | POA: Diagnosis not present

## 2022-06-19 ENCOUNTER — Telehealth: Payer: Self-pay

## 2022-06-19 DIAGNOSIS — M170 Bilateral primary osteoarthritis of knee: Secondary | ICD-10-CM | POA: Diagnosis not present

## 2022-06-19 DIAGNOSIS — Z9181 History of falling: Secondary | ICD-10-CM | POA: Diagnosis not present

## 2022-06-19 DIAGNOSIS — R569 Unspecified convulsions: Secondary | ICD-10-CM | POA: Diagnosis not present

## 2022-06-19 DIAGNOSIS — M109 Gout, unspecified: Secondary | ICD-10-CM | POA: Diagnosis not present

## 2022-06-19 DIAGNOSIS — I10 Essential (primary) hypertension: Secondary | ICD-10-CM | POA: Diagnosis not present

## 2022-06-19 DIAGNOSIS — I4891 Unspecified atrial fibrillation: Secondary | ICD-10-CM | POA: Diagnosis not present

## 2022-06-19 DIAGNOSIS — Z7901 Long term (current) use of anticoagulants: Secondary | ICD-10-CM | POA: Diagnosis not present

## 2022-06-19 DIAGNOSIS — Z556 Problems related to health literacy: Secondary | ICD-10-CM | POA: Diagnosis not present

## 2022-06-19 DIAGNOSIS — E039 Hypothyroidism, unspecified: Secondary | ICD-10-CM | POA: Diagnosis not present

## 2022-06-19 DIAGNOSIS — R69 Illness, unspecified: Secondary | ICD-10-CM | POA: Diagnosis not present

## 2022-06-19 DIAGNOSIS — L259 Unspecified contact dermatitis, unspecified cause: Secondary | ICD-10-CM | POA: Diagnosis not present

## 2022-06-19 DIAGNOSIS — Z6835 Body mass index (BMI) 35.0-35.9, adult: Secondary | ICD-10-CM | POA: Diagnosis not present

## 2022-06-19 DIAGNOSIS — G8929 Other chronic pain: Secondary | ICD-10-CM | POA: Diagnosis not present

## 2022-06-19 DIAGNOSIS — Z85828 Personal history of other malignant neoplasm of skin: Secondary | ICD-10-CM | POA: Diagnosis not present

## 2022-06-19 DIAGNOSIS — R627 Adult failure to thrive: Secondary | ICD-10-CM | POA: Diagnosis not present

## 2022-06-19 DIAGNOSIS — M549 Dorsalgia, unspecified: Secondary | ICD-10-CM | POA: Diagnosis not present

## 2022-06-19 NOTE — Telephone Encounter (Signed)
Chris calls nurse line to request verbal orders for social work as follows.   1x a week for 3 weeks.   Verbal order given.

## 2022-06-22 DIAGNOSIS — I4891 Unspecified atrial fibrillation: Secondary | ICD-10-CM | POA: Diagnosis not present

## 2022-06-22 DIAGNOSIS — L259 Unspecified contact dermatitis, unspecified cause: Secondary | ICD-10-CM | POA: Diagnosis not present

## 2022-06-22 DIAGNOSIS — G8929 Other chronic pain: Secondary | ICD-10-CM | POA: Diagnosis not present

## 2022-06-22 DIAGNOSIS — M549 Dorsalgia, unspecified: Secondary | ICD-10-CM | POA: Diagnosis not present

## 2022-06-22 DIAGNOSIS — R569 Unspecified convulsions: Secondary | ICD-10-CM | POA: Diagnosis not present

## 2022-06-22 DIAGNOSIS — Z85828 Personal history of other malignant neoplasm of skin: Secondary | ICD-10-CM | POA: Diagnosis not present

## 2022-06-22 DIAGNOSIS — I10 Essential (primary) hypertension: Secondary | ICD-10-CM | POA: Diagnosis not present

## 2022-06-22 DIAGNOSIS — Z6835 Body mass index (BMI) 35.0-35.9, adult: Secondary | ICD-10-CM | POA: Diagnosis not present

## 2022-06-22 DIAGNOSIS — M170 Bilateral primary osteoarthritis of knee: Secondary | ICD-10-CM | POA: Diagnosis not present

## 2022-06-22 DIAGNOSIS — R69 Illness, unspecified: Secondary | ICD-10-CM | POA: Diagnosis not present

## 2022-06-22 DIAGNOSIS — R627 Adult failure to thrive: Secondary | ICD-10-CM | POA: Diagnosis not present

## 2022-06-22 DIAGNOSIS — E039 Hypothyroidism, unspecified: Secondary | ICD-10-CM | POA: Diagnosis not present

## 2022-06-22 DIAGNOSIS — Z9181 History of falling: Secondary | ICD-10-CM | POA: Diagnosis not present

## 2022-06-22 DIAGNOSIS — Z556 Problems related to health literacy: Secondary | ICD-10-CM | POA: Diagnosis not present

## 2022-06-22 DIAGNOSIS — M109 Gout, unspecified: Secondary | ICD-10-CM | POA: Diagnosis not present

## 2022-06-22 DIAGNOSIS — Z7901 Long term (current) use of anticoagulants: Secondary | ICD-10-CM | POA: Diagnosis not present

## 2022-06-23 DIAGNOSIS — R69 Illness, unspecified: Secondary | ICD-10-CM | POA: Diagnosis not present

## 2022-06-23 DIAGNOSIS — Z6835 Body mass index (BMI) 35.0-35.9, adult: Secondary | ICD-10-CM | POA: Diagnosis not present

## 2022-06-23 DIAGNOSIS — R569 Unspecified convulsions: Secondary | ICD-10-CM | POA: Diagnosis not present

## 2022-06-23 DIAGNOSIS — Z7901 Long term (current) use of anticoagulants: Secondary | ICD-10-CM | POA: Diagnosis not present

## 2022-06-23 DIAGNOSIS — Z9181 History of falling: Secondary | ICD-10-CM | POA: Diagnosis not present

## 2022-06-23 DIAGNOSIS — I4891 Unspecified atrial fibrillation: Secondary | ICD-10-CM | POA: Diagnosis not present

## 2022-06-23 DIAGNOSIS — M109 Gout, unspecified: Secondary | ICD-10-CM | POA: Diagnosis not present

## 2022-06-23 DIAGNOSIS — Z556 Problems related to health literacy: Secondary | ICD-10-CM | POA: Diagnosis not present

## 2022-06-23 DIAGNOSIS — G8929 Other chronic pain: Secondary | ICD-10-CM | POA: Diagnosis not present

## 2022-06-23 DIAGNOSIS — L259 Unspecified contact dermatitis, unspecified cause: Secondary | ICD-10-CM | POA: Diagnosis not present

## 2022-06-23 DIAGNOSIS — R627 Adult failure to thrive: Secondary | ICD-10-CM | POA: Diagnosis not present

## 2022-06-23 DIAGNOSIS — Z85828 Personal history of other malignant neoplasm of skin: Secondary | ICD-10-CM | POA: Diagnosis not present

## 2022-06-23 DIAGNOSIS — I10 Essential (primary) hypertension: Secondary | ICD-10-CM | POA: Diagnosis not present

## 2022-06-23 DIAGNOSIS — M549 Dorsalgia, unspecified: Secondary | ICD-10-CM | POA: Diagnosis not present

## 2022-06-23 DIAGNOSIS — M170 Bilateral primary osteoarthritis of knee: Secondary | ICD-10-CM | POA: Diagnosis not present

## 2022-06-23 DIAGNOSIS — E039 Hypothyroidism, unspecified: Secondary | ICD-10-CM | POA: Diagnosis not present

## 2022-07-04 ENCOUNTER — Other Ambulatory Visit (HOSPITAL_COMMUNITY): Payer: Self-pay | Admitting: Psychiatry

## 2022-07-07 ENCOUNTER — Other Ambulatory Visit (HOSPITAL_COMMUNITY): Payer: Self-pay | Admitting: Psychiatry

## 2022-07-07 DIAGNOSIS — F419 Anxiety disorder, unspecified: Secondary | ICD-10-CM

## 2022-07-07 DIAGNOSIS — R251 Tremor, unspecified: Secondary | ICD-10-CM

## 2022-07-07 DIAGNOSIS — F339 Major depressive disorder, recurrent, unspecified: Secondary | ICD-10-CM

## 2022-07-09 ENCOUNTER — Telehealth (HOSPITAL_COMMUNITY): Payer: Self-pay

## 2022-07-09 DIAGNOSIS — F339 Major depressive disorder, recurrent, unspecified: Secondary | ICD-10-CM

## 2022-07-09 MED ORDER — DIVALPROEX SODIUM ER 500 MG PO TB24: 500.0000 mg | ORAL_TABLET | Freq: Two times a day (BID) | ORAL | 1 refills | Status: DC

## 2022-07-09 NOTE — Telephone Encounter (Signed)
Description of Depakote ER sent to his pharmacy

## 2022-07-09 NOTE — Telephone Encounter (Signed)
Patient called to request a refill for the following medication Last visit 05/07/22 Next visit 08/13/22      Disp Refills Start End   divalproex (DEPAKOTE ER) 500 MG 24 hr tablet 60 tablet 1 05/18/2022    Sig - Route: Take 1 tablet (500 mg total) by mouth 2 (two) times daily. - Oral   Sent to pharmacy as: divalproex (DEPAKOTE ER) 500 MG 24 hr tablet   E-Prescribing Status: Receipt confirmed by pharmacy (05/18/2022  2:27 PM EST)    [id number] [address]: [phone] Fax: [phone]

## 2022-07-10 DIAGNOSIS — Z9181 History of falling: Secondary | ICD-10-CM | POA: Diagnosis not present

## 2022-07-10 DIAGNOSIS — Z6835 Body mass index (BMI) 35.0-35.9, adult: Secondary | ICD-10-CM | POA: Diagnosis not present

## 2022-07-10 DIAGNOSIS — R569 Unspecified convulsions: Secondary | ICD-10-CM | POA: Diagnosis not present

## 2022-07-10 DIAGNOSIS — R69 Illness, unspecified: Secondary | ICD-10-CM | POA: Diagnosis not present

## 2022-07-10 DIAGNOSIS — Z7901 Long term (current) use of anticoagulants: Secondary | ICD-10-CM | POA: Diagnosis not present

## 2022-07-10 DIAGNOSIS — I4891 Unspecified atrial fibrillation: Secondary | ICD-10-CM | POA: Diagnosis not present

## 2022-07-10 DIAGNOSIS — M170 Bilateral primary osteoarthritis of knee: Secondary | ICD-10-CM | POA: Diagnosis not present

## 2022-07-10 DIAGNOSIS — Z85828 Personal history of other malignant neoplasm of skin: Secondary | ICD-10-CM | POA: Diagnosis not present

## 2022-07-10 DIAGNOSIS — M549 Dorsalgia, unspecified: Secondary | ICD-10-CM | POA: Diagnosis not present

## 2022-07-10 DIAGNOSIS — R627 Adult failure to thrive: Secondary | ICD-10-CM | POA: Diagnosis not present

## 2022-07-10 DIAGNOSIS — E039 Hypothyroidism, unspecified: Secondary | ICD-10-CM | POA: Diagnosis not present

## 2022-07-10 DIAGNOSIS — G8929 Other chronic pain: Secondary | ICD-10-CM | POA: Diagnosis not present

## 2022-07-10 DIAGNOSIS — L259 Unspecified contact dermatitis, unspecified cause: Secondary | ICD-10-CM | POA: Diagnosis not present

## 2022-07-10 DIAGNOSIS — I10 Essential (primary) hypertension: Secondary | ICD-10-CM | POA: Diagnosis not present

## 2022-07-10 DIAGNOSIS — Z556 Problems related to health literacy: Secondary | ICD-10-CM | POA: Diagnosis not present

## 2022-07-10 DIAGNOSIS — M109 Gout, unspecified: Secondary | ICD-10-CM | POA: Diagnosis not present

## 2022-07-14 ENCOUNTER — Telehealth (HOSPITAL_COMMUNITY): Payer: Self-pay

## 2022-07-14 NOTE — Telephone Encounter (Signed)
Medication management - Telephone call with patient's Mother, after she had left a message that patient was in [place], [place] staying with her for a bit and wanted to know how to get his needed refill of Depakote ER 500 mg. [facility] [address] [facility] [place], and then back to patient's Mother, to inform her they have the current order sent in by Dr. Adele Schilder on 07/09/22 and that the [facility] could just have it moved over to them to fill.  Collateral agreed to call they [facility] [place] to move the order and will call back if any issues with the transfer and patient getting his needed refill.

## 2022-07-15 DIAGNOSIS — I4891 Unspecified atrial fibrillation: Secondary | ICD-10-CM | POA: Diagnosis not present

## 2022-07-15 DIAGNOSIS — G8929 Other chronic pain: Secondary | ICD-10-CM | POA: Diagnosis not present

## 2022-07-15 DIAGNOSIS — Z7901 Long term (current) use of anticoagulants: Secondary | ICD-10-CM | POA: Diagnosis not present

## 2022-07-15 DIAGNOSIS — M549 Dorsalgia, unspecified: Secondary | ICD-10-CM | POA: Diagnosis not present

## 2022-07-15 DIAGNOSIS — Z85828 Personal history of other malignant neoplasm of skin: Secondary | ICD-10-CM | POA: Diagnosis not present

## 2022-07-15 DIAGNOSIS — R569 Unspecified convulsions: Secondary | ICD-10-CM | POA: Diagnosis not present

## 2022-07-15 DIAGNOSIS — R69 Illness, unspecified: Secondary | ICD-10-CM | POA: Diagnosis not present

## 2022-07-15 DIAGNOSIS — M170 Bilateral primary osteoarthritis of knee: Secondary | ICD-10-CM | POA: Diagnosis not present

## 2022-07-15 DIAGNOSIS — E039 Hypothyroidism, unspecified: Secondary | ICD-10-CM | POA: Diagnosis not present

## 2022-07-15 DIAGNOSIS — R627 Adult failure to thrive: Secondary | ICD-10-CM | POA: Diagnosis not present

## 2022-07-15 DIAGNOSIS — I10 Essential (primary) hypertension: Secondary | ICD-10-CM | POA: Diagnosis not present

## 2022-07-15 DIAGNOSIS — Z556 Problems related to health literacy: Secondary | ICD-10-CM | POA: Diagnosis not present

## 2022-07-15 DIAGNOSIS — L259 Unspecified contact dermatitis, unspecified cause: Secondary | ICD-10-CM | POA: Diagnosis not present

## 2022-07-15 DIAGNOSIS — M109 Gout, unspecified: Secondary | ICD-10-CM | POA: Diagnosis not present

## 2022-07-15 DIAGNOSIS — Z6835 Body mass index (BMI) 35.0-35.9, adult: Secondary | ICD-10-CM | POA: Diagnosis not present

## 2022-07-15 DIAGNOSIS — Z9181 History of falling: Secondary | ICD-10-CM | POA: Diagnosis not present

## 2022-07-20 ENCOUNTER — Other Ambulatory Visit (HOSPITAL_COMMUNITY): Payer: Self-pay | Admitting: *Deleted

## 2022-07-20 ENCOUNTER — Other Ambulatory Visit (HOSPITAL_COMMUNITY): Payer: Self-pay | Admitting: Psychiatry

## 2022-07-20 DIAGNOSIS — F339 Major depressive disorder, recurrent, unspecified: Secondary | ICD-10-CM

## 2022-07-20 DIAGNOSIS — R251 Tremor, unspecified: Secondary | ICD-10-CM

## 2022-07-20 MED ORDER — BENZTROPINE MESYLATE 1 MG PO TABS: 1.0000 mg | ORAL_TABLET | Freq: Every day | ORAL | 0 refills | Status: DC

## 2022-07-23 ENCOUNTER — Other Ambulatory Visit (HOSPITAL_COMMUNITY): Payer: Self-pay | Admitting: Psychiatry

## 2022-07-23 DIAGNOSIS — R251 Tremor, unspecified: Secondary | ICD-10-CM

## 2022-07-23 DIAGNOSIS — F339 Major depressive disorder, recurrent, unspecified: Secondary | ICD-10-CM

## 2022-07-23 DIAGNOSIS — L718 Other rosacea: Secondary | ICD-10-CM | POA: Diagnosis not present

## 2022-07-24 DIAGNOSIS — C44319 Basal cell carcinoma of skin of other parts of face: Secondary | ICD-10-CM | POA: Diagnosis not present

## 2022-07-27 DIAGNOSIS — Z85828 Personal history of other malignant neoplasm of skin: Secondary | ICD-10-CM | POA: Diagnosis not present

## 2022-07-27 DIAGNOSIS — M170 Bilateral primary osteoarthritis of knee: Secondary | ICD-10-CM | POA: Diagnosis not present

## 2022-07-27 DIAGNOSIS — R569 Unspecified convulsions: Secondary | ICD-10-CM | POA: Diagnosis not present

## 2022-07-27 DIAGNOSIS — M549 Dorsalgia, unspecified: Secondary | ICD-10-CM | POA: Diagnosis not present

## 2022-07-27 DIAGNOSIS — G8929 Other chronic pain: Secondary | ICD-10-CM | POA: Diagnosis not present

## 2022-07-27 DIAGNOSIS — Z9181 History of falling: Secondary | ICD-10-CM | POA: Diagnosis not present

## 2022-07-27 DIAGNOSIS — I10 Essential (primary) hypertension: Secondary | ICD-10-CM | POA: Diagnosis not present

## 2022-07-27 DIAGNOSIS — I4891 Unspecified atrial fibrillation: Secondary | ICD-10-CM | POA: Diagnosis not present

## 2022-07-27 DIAGNOSIS — R627 Adult failure to thrive: Secondary | ICD-10-CM | POA: Diagnosis not present

## 2022-07-27 DIAGNOSIS — E039 Hypothyroidism, unspecified: Secondary | ICD-10-CM | POA: Diagnosis not present

## 2022-07-27 DIAGNOSIS — Z556 Problems related to health literacy: Secondary | ICD-10-CM | POA: Diagnosis not present

## 2022-07-27 DIAGNOSIS — R69 Illness, unspecified: Secondary | ICD-10-CM | POA: Diagnosis not present

## 2022-07-27 DIAGNOSIS — Z6835 Body mass index (BMI) 35.0-35.9, adult: Secondary | ICD-10-CM | POA: Diagnosis not present

## 2022-07-27 DIAGNOSIS — M109 Gout, unspecified: Secondary | ICD-10-CM | POA: Diagnosis not present

## 2022-07-27 DIAGNOSIS — Z7901 Long term (current) use of anticoagulants: Secondary | ICD-10-CM | POA: Diagnosis not present

## 2022-07-27 DIAGNOSIS — L259 Unspecified contact dermatitis, unspecified cause: Secondary | ICD-10-CM | POA: Diagnosis not present

## 2022-07-28 DIAGNOSIS — Z556 Problems related to health literacy: Secondary | ICD-10-CM | POA: Diagnosis not present

## 2022-07-28 DIAGNOSIS — I10 Essential (primary) hypertension: Secondary | ICD-10-CM | POA: Diagnosis not present

## 2022-07-28 DIAGNOSIS — E039 Hypothyroidism, unspecified: Secondary | ICD-10-CM | POA: Diagnosis not present

## 2022-07-28 DIAGNOSIS — R69 Illness, unspecified: Secondary | ICD-10-CM | POA: Diagnosis not present

## 2022-07-28 DIAGNOSIS — Z85828 Personal history of other malignant neoplasm of skin: Secondary | ICD-10-CM | POA: Diagnosis not present

## 2022-07-28 DIAGNOSIS — Z7901 Long term (current) use of anticoagulants: Secondary | ICD-10-CM | POA: Diagnosis not present

## 2022-07-28 DIAGNOSIS — L259 Unspecified contact dermatitis, unspecified cause: Secondary | ICD-10-CM | POA: Diagnosis not present

## 2022-07-28 DIAGNOSIS — R627 Adult failure to thrive: Secondary | ICD-10-CM | POA: Diagnosis not present

## 2022-07-28 DIAGNOSIS — M170 Bilateral primary osteoarthritis of knee: Secondary | ICD-10-CM | POA: Diagnosis not present

## 2022-07-28 DIAGNOSIS — M549 Dorsalgia, unspecified: Secondary | ICD-10-CM | POA: Diagnosis not present

## 2022-07-28 DIAGNOSIS — Z9181 History of falling: Secondary | ICD-10-CM | POA: Diagnosis not present

## 2022-07-28 DIAGNOSIS — Z6835 Body mass index (BMI) 35.0-35.9, adult: Secondary | ICD-10-CM | POA: Diagnosis not present

## 2022-07-28 DIAGNOSIS — I4891 Unspecified atrial fibrillation: Secondary | ICD-10-CM | POA: Diagnosis not present

## 2022-07-28 DIAGNOSIS — M109 Gout, unspecified: Secondary | ICD-10-CM | POA: Diagnosis not present

## 2022-07-28 DIAGNOSIS — R569 Unspecified convulsions: Secondary | ICD-10-CM | POA: Diagnosis not present

## 2022-07-28 DIAGNOSIS — G8929 Other chronic pain: Secondary | ICD-10-CM | POA: Diagnosis not present

## 2022-08-03 DIAGNOSIS — E039 Hypothyroidism, unspecified: Secondary | ICD-10-CM | POA: Diagnosis not present

## 2022-08-03 DIAGNOSIS — I4891 Unspecified atrial fibrillation: Secondary | ICD-10-CM | POA: Diagnosis not present

## 2022-08-03 DIAGNOSIS — M109 Gout, unspecified: Secondary | ICD-10-CM | POA: Diagnosis not present

## 2022-08-03 DIAGNOSIS — Z6835 Body mass index (BMI) 35.0-35.9, adult: Secondary | ICD-10-CM | POA: Diagnosis not present

## 2022-08-03 DIAGNOSIS — G8929 Other chronic pain: Secondary | ICD-10-CM | POA: Diagnosis not present

## 2022-08-03 DIAGNOSIS — L259 Unspecified contact dermatitis, unspecified cause: Secondary | ICD-10-CM | POA: Diagnosis not present

## 2022-08-03 DIAGNOSIS — Z7901 Long term (current) use of anticoagulants: Secondary | ICD-10-CM | POA: Diagnosis not present

## 2022-08-03 DIAGNOSIS — Z556 Problems related to health literacy: Secondary | ICD-10-CM | POA: Diagnosis not present

## 2022-08-03 DIAGNOSIS — M170 Bilateral primary osteoarthritis of knee: Secondary | ICD-10-CM | POA: Diagnosis not present

## 2022-08-03 DIAGNOSIS — Z85828 Personal history of other malignant neoplasm of skin: Secondary | ICD-10-CM | POA: Diagnosis not present

## 2022-08-03 DIAGNOSIS — I10 Essential (primary) hypertension: Secondary | ICD-10-CM | POA: Diagnosis not present

## 2022-08-03 DIAGNOSIS — M549 Dorsalgia, unspecified: Secondary | ICD-10-CM | POA: Diagnosis not present

## 2022-08-03 DIAGNOSIS — R627 Adult failure to thrive: Secondary | ICD-10-CM | POA: Diagnosis not present

## 2022-08-03 DIAGNOSIS — R569 Unspecified convulsions: Secondary | ICD-10-CM | POA: Diagnosis not present

## 2022-08-03 DIAGNOSIS — R69 Illness, unspecified: Secondary | ICD-10-CM | POA: Diagnosis not present

## 2022-08-03 DIAGNOSIS — Z9181 History of falling: Secondary | ICD-10-CM | POA: Diagnosis not present

## 2022-08-05 DIAGNOSIS — R627 Adult failure to thrive: Secondary | ICD-10-CM | POA: Diagnosis not present

## 2022-08-05 DIAGNOSIS — I10 Essential (primary) hypertension: Secondary | ICD-10-CM | POA: Diagnosis not present

## 2022-08-05 DIAGNOSIS — Z7901 Long term (current) use of anticoagulants: Secondary | ICD-10-CM | POA: Diagnosis not present

## 2022-08-05 DIAGNOSIS — L259 Unspecified contact dermatitis, unspecified cause: Secondary | ICD-10-CM | POA: Diagnosis not present

## 2022-08-05 DIAGNOSIS — G8929 Other chronic pain: Secondary | ICD-10-CM | POA: Diagnosis not present

## 2022-08-05 DIAGNOSIS — R69 Illness, unspecified: Secondary | ICD-10-CM | POA: Diagnosis not present

## 2022-08-05 DIAGNOSIS — I4891 Unspecified atrial fibrillation: Secondary | ICD-10-CM | POA: Diagnosis not present

## 2022-08-05 DIAGNOSIS — M109 Gout, unspecified: Secondary | ICD-10-CM | POA: Diagnosis not present

## 2022-08-05 DIAGNOSIS — R569 Unspecified convulsions: Secondary | ICD-10-CM | POA: Diagnosis not present

## 2022-08-05 DIAGNOSIS — E039 Hypothyroidism, unspecified: Secondary | ICD-10-CM | POA: Diagnosis not present

## 2022-08-05 DIAGNOSIS — Z9181 History of falling: Secondary | ICD-10-CM | POA: Diagnosis not present

## 2022-08-05 DIAGNOSIS — Z6835 Body mass index (BMI) 35.0-35.9, adult: Secondary | ICD-10-CM | POA: Diagnosis not present

## 2022-08-05 DIAGNOSIS — M170 Bilateral primary osteoarthritis of knee: Secondary | ICD-10-CM | POA: Diagnosis not present

## 2022-08-05 DIAGNOSIS — M549 Dorsalgia, unspecified: Secondary | ICD-10-CM | POA: Diagnosis not present

## 2022-08-05 DIAGNOSIS — Z85828 Personal history of other malignant neoplasm of skin: Secondary | ICD-10-CM | POA: Diagnosis not present

## 2022-08-05 DIAGNOSIS — Z556 Problems related to health literacy: Secondary | ICD-10-CM | POA: Diagnosis not present

## 2022-08-06 DIAGNOSIS — R627 Adult failure to thrive: Secondary | ICD-10-CM | POA: Diagnosis not present

## 2022-08-06 DIAGNOSIS — L259 Unspecified contact dermatitis, unspecified cause: Secondary | ICD-10-CM | POA: Diagnosis not present

## 2022-08-06 DIAGNOSIS — G8929 Other chronic pain: Secondary | ICD-10-CM | POA: Diagnosis not present

## 2022-08-06 DIAGNOSIS — M170 Bilateral primary osteoarthritis of knee: Secondary | ICD-10-CM | POA: Diagnosis not present

## 2022-08-06 DIAGNOSIS — M109 Gout, unspecified: Secondary | ICD-10-CM | POA: Diagnosis not present

## 2022-08-06 DIAGNOSIS — Z85828 Personal history of other malignant neoplasm of skin: Secondary | ICD-10-CM | POA: Diagnosis not present

## 2022-08-06 DIAGNOSIS — Z7901 Long term (current) use of anticoagulants: Secondary | ICD-10-CM | POA: Diagnosis not present

## 2022-08-06 DIAGNOSIS — E039 Hypothyroidism, unspecified: Secondary | ICD-10-CM | POA: Diagnosis not present

## 2022-08-06 DIAGNOSIS — Z556 Problems related to health literacy: Secondary | ICD-10-CM | POA: Diagnosis not present

## 2022-08-06 DIAGNOSIS — I4891 Unspecified atrial fibrillation: Secondary | ICD-10-CM | POA: Diagnosis not present

## 2022-08-06 DIAGNOSIS — I10 Essential (primary) hypertension: Secondary | ICD-10-CM | POA: Diagnosis not present

## 2022-08-06 DIAGNOSIS — Z9181 History of falling: Secondary | ICD-10-CM | POA: Diagnosis not present

## 2022-08-06 DIAGNOSIS — Z6835 Body mass index (BMI) 35.0-35.9, adult: Secondary | ICD-10-CM | POA: Diagnosis not present

## 2022-08-06 DIAGNOSIS — R69 Illness, unspecified: Secondary | ICD-10-CM | POA: Diagnosis not present

## 2022-08-06 DIAGNOSIS — R569 Unspecified convulsions: Secondary | ICD-10-CM | POA: Diagnosis not present

## 2022-08-06 DIAGNOSIS — M549 Dorsalgia, unspecified: Secondary | ICD-10-CM | POA: Diagnosis not present

## 2022-08-13 ENCOUNTER — Ambulatory Visit (HOSPITAL_BASED_OUTPATIENT_CLINIC_OR_DEPARTMENT_OTHER): Payer: Medicare HMO | Admitting: Psychiatry

## 2022-08-13 ENCOUNTER — Other Ambulatory Visit (HOSPITAL_COMMUNITY): Payer: Self-pay | Admitting: Psychiatry

## 2022-08-13 ENCOUNTER — Encounter (HOSPITAL_COMMUNITY): Payer: Self-pay | Admitting: Psychiatry

## 2022-08-13 DIAGNOSIS — R251 Tremor, unspecified: Secondary | ICD-10-CM | POA: Diagnosis not present

## 2022-08-13 DIAGNOSIS — F339 Major depressive disorder, recurrent, unspecified: Secondary | ICD-10-CM

## 2022-08-13 DIAGNOSIS — F419 Anxiety disorder, unspecified: Secondary | ICD-10-CM | POA: Diagnosis not present

## 2022-08-13 MED ORDER — PHENELZINE SULFATE 15 MG PO TABS: 45.0000 mg | ORAL_TABLET | Freq: Two times a day (BID) | ORAL | 0 refills | Status: DC

## 2022-08-13 MED ORDER — PERPHENAZINE 8 MG PO TABS: 24.0000 mg | ORAL_TABLET | Freq: Every day | ORAL | 0 refills | Status: DC

## 2022-08-13 MED ORDER — BENZTROPINE MESYLATE 1 MG PO TABS: 1.0000 mg | ORAL_TABLET | Freq: Every day | ORAL | 0 refills | Status: DC

## 2022-08-13 MED ORDER — DIVALPROEX SODIUM ER 500 MG PO TB24: 500.0000 mg | ORAL_TABLET | Freq: Two times a day (BID) | ORAL | 1 refills | Status: DC

## 2022-08-13 NOTE — Progress Notes (Signed)
BH MD/PA/NP OP Progress Note  08/13/2022 2:12 PM [name]  MRN:  [id number]  Chief Complaint:  Chief Complaint  Patient presents with   Follow-up   HPI:  Patient is here for follow-up appointment.  He is taking Depakote Nardil, Trilafon and Cogentin.  He reported having some issues with his mother who is concerned about his memory, tremors and balance.  We have recommended him to see a neurology but patient not sure if he schedule the appointment.  He admitted some time forgetfulness but still able to drive to visit his mother who lives in [place], [place] which is 30 minutes from [place].  He enjoys going there and watching TV with his mother.  He denies any hallucination, paranoia, suicidal thoughts.  He has not seen his PCP and does not remember when his next appointment.  He is taking multiple medication.  He is asking multiple questions related to his health needs and I strongly encourage should contact with his PCP to answer those questions.  He reported his appetite is okay and his weight is stable.  He denies any hallucination, paranoia, suicidal thoughts.  He denies any panic attack.  He feels the current medicine is helping his depression and anxiety and does not want to change.  He is tolerating so far all his medication.  His tremors are chronic.  Visit Diagnosis:    ICD-10-CM   1. Tremor  R25.1 benztropine (COGENTIN) 1 MG tablet    2. MDD (major depressive disorder), recurrent episode, with atypical features ([place])  F33.9 benztropine (COGENTIN) 1 MG tablet    divalproex (DEPAKOTE ER) 500 MG 24 hr tablet    perphenazine (TRILAFON) 8 MG tablet    phenelzine (NARDIL) 15 MG tablet    3. Anxiety  F41.9 phenelzine (NARDIL) 15 MG tablet        H/O multiple inpatient from [phone] for severe depression, suicidal thoughts. No h/o suicidal attempt. H/O OCD, schizoaffective disorder and major depression.  Tried numerous medication including TCAs.  We tried Lamictal but he  develop a rash.   Past Medical History:  Past Medical History:  Diagnosis Date   A-fib ([place])    Acute right-sided low back pain    Arrhythmia    Chronic a-fib (HCC)    Closed fracture of left distal radius    Contact dermatitis 04/16/2017   Depression    Depression    Hypothyroidism    Low back pain    Morbid obesity, BMI unknown (HCC)    Osteoarthritis    oa in bilateral knees   Schizophrenia ([place])    Seizures ([place])    Thyroid disease    Varicose veins     Past Surgical History:  Procedure Laterality Date   ACHILLES TENDON REPAIR  approx. 2004   right foot   CATARACT EXTRACTION     bilateral   OPEN REDUCTION INTERNAL FIXATION (ORIF) DISTAL RADIAL FRACTURE Left 07/21/2018   Procedure: OPEN REDUCTION INTERNAL FIXATION (ORIF) DISTAL RADIAL FRACTURE;  Surgeon: [name], MD;  Location: [facility];  Service: Orthopedics;  Laterality: Left;   stab phlebectomy  Right 11/09/2016   stab phlebectomy R leg by Tinnie Gens MD   TOOTH EXTRACTION      Family Psychiatric History: Reviewed.  Family History:  Family History  Problem Relation Age of Onset   Diabetes Father    Heart disease Father    Diabetes Brother    OCD Mother     Social History:  Social History   Socioeconomic History   Marital status: Single    Spouse name: Not on file   Number of children: Not on file   Years of education: Not on file   Highest education level: Not on file  Occupational History   Occupation: disabled  Tobacco Use   Smoking status: Never   Smokeless tobacco: Never  Vaping Use   Vaping Use: Never used  Substance and Sexual Activity   Alcohol use: No    Alcohol/week: 0.0 standard drinks of alcohol   Drug use: No   Sexual activity: Never  Other Topics Concern   Not on file  Social History Narrative   Not on file   Social Determinants of Health   Financial Resource Strain: Not on file  Food Insecurity: Not on file  Transportation Needs: Not on file   Physical Activity: Not on file  Stress: Not on file  Social Connections: Not on file    Allergies:  Allergies  Allergen Reactions   Lamictal [Lamotrigine] Rash   Metformin And Related Rash    Metabolic Disorder Labs: Lab Results  Component Value Date   HGBA1C 6.7 09/15/2021   MPG 146 (H) 04/24/2015   MPG 140 02/05/2007   No results found for: "PROLACTIN" Lab Results  Component Value Date   CHOL 194 12/18/2020   TRIG 309 (H) 12/18/2020   HDL 27 (L) 12/18/2020   CHOLHDL 7.2 (H) 12/18/2020   VLDL 60 (H) 07/16/2016   LDLCALC 113 (H) 12/18/2020   LDLCALC 138 (H) 11/02/2019   Lab Results  Component Value Date   TSH 1.330 12/18/2020   TSH 1.960 11/02/2019    Therapeutic Level Labs: No results found for: "LITHIUM" Lab Results  Component Value Date   VALPROATE 48 (L) 01/21/2022   VALPROATE 58 09/15/2021   No results found for: "CBMZ"  Current Medications: Current Outpatient Medications  Medication Sig Dispense Refill   allopurinol (ZYLOPRIM) 300 MG tablet TAKE 1 TABLET BY MOUTH EVERY DAY 90 tablet 1   antiseptic oral rinse (BIOTENE) LIQD 15 MLS BY MOUTH RINSE ROUTE AS NEEDED FOR DRY MOUTH. 473 mL 0   atorvastatin (LIPITOR) 40 MG tablet TAKE 1 TABLET BY MOUTH EVERY DAY 90 tablet 1   benztropine (COGENTIN) 1 MG tablet Take 1 tablet (1 mg total) by mouth daily. 10 tablet 0   divalproex (DEPAKOTE ER) 500 MG 24 hr tablet Take 1 tablet (500 mg total) by mouth 2 (two) times daily. 60 tablet 1   divalproex (DEPAKOTE) 500 MG DR tablet Take 1 tablet (500 mg total) by mouth 2 (two) times daily. (Patient not taking: Reported on 07/09/2022) 180 tablet 0   ELIQUIS 5 MG TABS tablet TAKE 1 TABLET BY MOUTH TWICE A DAY 60 tablet 11   HYDROcodone-acetaminophen (NORCO) 5-325 MG tablet Take 1 tablet by mouth every 6 (six) hours as needed for moderate pain. 30 tablet 0   levothyroxine (SYNTHROID) 100 MCG tablet TAKE 1 TABLET BY MOUTH EVERY DAY 90 tablet 1   lidocaine (LIDODERM) 5 % Place 1  patch onto the skin daily. Remove & Discard patch within 12 hours or as directed by MD (Patient not taking: Reported on 04/02/2022) 30 patch 0   ONETOUCH VERIO test strip TEST UP TO 4 TIMES A DAY (Patient not taking: Reported on 03/05/2022) 100 strip 2   perphenazine (TRILAFON) 8 MG tablet Take 3 tablets (24 mg total) by mouth at bedtime. 270 tablet 0   phenelzine (NARDIL) 15 MG tablet  Take 3 tablets (45 mg total) by mouth 2 (two) times daily. 540 tablet 0   sildenafil (VIAGRA) 25 MG tablet Take 1 tablet (25 mg total) by mouth daily as needed for erectile dysfunction. 10 tablet 0   No current facility-administered medications for this visit.     Musculoskeletal: Strength & Muscle Tone: decreased Gait & Station: unsteady Patient leans: Right and Left  Psychiatric Specialty Exam: Review of Systems  Blood pressure 131/84, pulse 72, resp. rate 18, height '5\' 10"'$  (1.778 m), weight 266 lb 12.8 oz (121 kg), SpO2 96 %.There is no height or weight on file to calculate BMI.  General Appearance: Fairly Groomed  Eye Contact:  Fair  Speech:  Slow  Volume:  Decreased  Mood:  Euthymic  Affect:  Constricted  Thought Process:  Descriptions of Associations: Intact  Orientation:  Full (Time, Place, and Person)  Thought Content: WDL   Suicidal Thoughts:  No  Homicidal Thoughts:  No  Memory:  Immediate;   Fair Recent;   Fair Remote;   Fair  Judgement:  Fair  Insight:  Present  Psychomotor Activity:  Tremor  Concentration:  Concentration: Fair and Attention Span: Fair  Recall:  [employer] of Knowledge: Fair  Language: Fair  Akathisia:  No  Handed:  Right  AIMS (if indicated): not done  Assets:  Communication Skills Desire for Improvement Housing Transportation  ADL's:  Intact  Cognition: Impaired,  Mild  Sleep:  Good   Screenings: PHQ2-9    [place] Visit from 04/02/2022 in [facility] Office Visit from 03/02/2022 in [facility]  Office Visit from 11/17/2021 in [facility] Office Visit from 09/15/2021 in [facility] Office Visit from 07/30/2021 in [facility]  PHQ-2 Total Score 3 2 0 0 1  PHQ-9 Total Score 3 5 0 1 [place] ED from 12/21/2021 in [facility] Department at [facility] ED from 09/17/2021 in [facility] Emergency Department at [facility] Video Visit from 12/06/2020 in [facility]  C-SSRS RISK CATEGORY No Risk No Risk No Risk        Assessment and Plan: Major depressive disorder, recurrent.  Anxiety.  Tremors.  Patient has chronic issues which is mostly medical related and I encouraged to see his primary care physician.  He has tremors, memory impairment.  He does not want to change his psychotropic medication because he feels it is helping.  He sleeps good.  He continues to drive and goes every weekend to visit his mother.  Continue Trilafon 24 mg at bedtime, Cogentin 1 mg daily, Depakote ER 500 mg 2 times a day and Nardil 45 mg 2 times a day.  Patient prefer a 90-day supply.  He is not interested in therapy.  I recommend to call us back if is any question or any concern.  Follow-up in 3 months.  We will refer him to see his PCP and neurology.   Collaboration of Care: Collaboration of Care: Other provider involved in patient's care AEB notes are available in epic to review.  Patient/Guardian was advised Release of Information must be obtained prior to any record release in order to collaborate their care with an outside provider. Patient/Guardian was advised if they have not already done so to contact the registration department to sign all necessary forms in order for [place] to release information  regarding their care.   Consent: Patient/Guardian gives verbal consent for treatment and assignment of benefits for services provided during this visit. Patient/Guardian  expressed understanding and agreed to proceed.    Kathlee Nations, MD 08/13/2022, 2:12 PM

## 2022-08-15 ENCOUNTER — Other Ambulatory Visit: Payer: Self-pay | Admitting: Family Medicine

## 2022-08-31 ENCOUNTER — Other Ambulatory Visit (HOSPITAL_COMMUNITY): Payer: Self-pay | Admitting: Psychiatry

## 2022-08-31 DIAGNOSIS — F419 Anxiety disorder, unspecified: Secondary | ICD-10-CM

## 2022-08-31 DIAGNOSIS — F339 Major depressive disorder, recurrent, unspecified: Secondary | ICD-10-CM

## 2022-08-31 DIAGNOSIS — R251 Tremor, unspecified: Secondary | ICD-10-CM

## 2022-09-07 DIAGNOSIS — L304 Erythema intertrigo: Secondary | ICD-10-CM | POA: Diagnosis not present

## 2022-09-07 DIAGNOSIS — L718 Other rosacea: Secondary | ICD-10-CM | POA: Diagnosis not present

## 2022-09-08 ENCOUNTER — Encounter: Payer: Self-pay | Admitting: Family Medicine

## 2022-09-08 ENCOUNTER — Ambulatory Visit (INDEPENDENT_AMBULATORY_CARE_PROVIDER_SITE_OTHER): Payer: Medicare HMO | Admitting: Family Medicine

## 2022-09-08 VITALS — BP 135/80 | HR 76 | Ht 70.0 in | Wt 260.4 lb

## 2022-09-08 DIAGNOSIS — L719 Rosacea, unspecified: Secondary | ICD-10-CM

## 2022-09-08 DIAGNOSIS — E1169 Type 2 diabetes mellitus with other specified complication: Secondary | ICD-10-CM | POA: Diagnosis not present

## 2022-09-08 DIAGNOSIS — E785 Hyperlipidemia, unspecified: Secondary | ICD-10-CM

## 2022-09-08 DIAGNOSIS — E119 Type 2 diabetes mellitus without complications: Secondary | ICD-10-CM

## 2022-09-08 MED ORDER — POLYETHYLENE GLYCOL 3350 17 GM/SCOOP PO POWD: 17.0000 g | Freq: Every day | ORAL | 0 refills | Status: DC

## 2022-09-08 NOTE — Patient Instructions (Signed)
Today recommend check your cholesterol and A1c, these test will take 1 to 2 days to come back.  If you are able to give [place] urine sample, that will help [place] check how your kidneys are doing as well.  For your constipation, I would recommend getting MiraLAX (I sent in prescription and but sometimes it is cheaper over-the-counter).  You can take MiraLAX up to 3 times a day as needed.  Make sure to increase the amount of water you are drinking as well.

## 2022-09-08 NOTE — Progress Notes (Unsigned)
Subjective:  CC -- Annual Physical; With complaints of ***  Pt reports he ***   Cardiovascular: The 10-year ASCVD risk score (Arnett DK, et al., 2019) is: 30.3%   Values used to calculate the score:     Age: 62 years     Sex: Male     Is Non-Hispanic African American: No     Diabetic: Yes     Tobacco smoker: No     Systolic Blood Pressure: [id number] mmHg     Is BP treated: No     HDL Cholesterol: 27 mg/dL     Total Cholesterol: 194 mg/dL Patient has declined statin use    - Risk as of ***(date): *** (assessment every 3-5 years) - Dx Hypertension: {YES/NO/WILD RC:4691767  - Dx Hyperlipidemia: {YES/NO/WILD RC:4691767 (once age 59-21; high risk >25yo, low risk >35)*** - Dx Obesity: yes (Class I BMI <34.9, Class II <39.9, Class III < 49.9)*** - Physical Activity: {YES/NO/WILD RC:[id number]  - Diabetes: {YES/NO/WILD RC:4691767 (age 55-70 w/ BMI >25, or HTN, or HLD) *** (A1c >6.5, or classic Sxs w/ random CBG >200, or FBG >126 (fasting >8hr))***  Cancer: Colorectal >> Colonoscopy: patient declines Skin >> Suspicious lesions: no   Social: Alcohol Use: no  Tobacco Use: {YES/NO/WILD RC:4691767   - Interested in Quitting: {YES/NO/WILD RC:4691767  Other Drugs: {YES/NO/WILD RC:4691767  Risky Sexual Behavior: {YES/NO/WILD CARDS:18581}  - Chlamydia: If at increased risk - Gonorrhea: If at increased risk - Syphilis: If at increased risk - HIV: All individuals (15-18yo once, then annually if risks are high) >> should be checked anytime STDs are checked. - Hep C: once if born in [place] between 1945-1965; or at increased risk - Hep B: If at increased risk*** Depression: {YES/NO/WILD CARDS:18581}   - PHQ9 score:  Support and Life at Home: {YES/NO/WILD RC:4691767   Other: Osteoporosis: {YES/NO/WILD CARDS:18581} (men w/ clinical manifestations of low bone mass)*** Zoster Vaccine: {YES/NO/WILD CARDS:18581} (those >50yo)*** Flu Vaccine: {YES/NO/WILD RC:[id number]  Pneumonia Vaccine:  {YES/NO/WILD [id number] (those w/ risk factors)*** - Both: Immunocompromised, cochlear implant, CSF leak, asplenic, sickle cell, CKD - PPSV-23 only: Heart dz, lung disease, DM, tobacco abuse, alcoholism, cirrhosis/liver disease.***  Past Medical History Patient Active Problem List   Diagnosis Date Noted   DISH (diffuse idiopathic skeletal hyperostosis) 05/12/2022   Bilateral primary osteoarthritis of hip 08/13/2021   Statin intolerance 06/24/2021   Left knee pain 07/23/2020   Hip pain 01/26/2020   Spinal stenosis of lumbar region 01/16/2020   Chronic lumbar radiculopathy 12/08/2019   Hyperlipidemia associated with type 2 diabetes mellitus ([place]) 12/08/2019   Dizziness 11/03/2019   Paronychia of left index finger 05/19/2019   Fall at home 11/24/2018   Rosacea 10/24/2018   Loss of weight 09/09/2018   Schizoaffective disorder, depressive type ([place]) 04/08/2018   Varicose veins of bilateral lower extremities with other complications 0000000   Erectile dysfunction 04/26/2015   Hypothyroidism    Seizures (HCC)    Persistent atrial fibrillation (HCC)    BMI 37.0-37.9, adult    Acute bilateral low back pain without sciatica    Seborrheic dermatitis 02/10/2007   T2DM (type 2 diabetes mellitus) ([place]) 02/10/2007    Medications- reviewed and updated Current Outpatient Medications  Medication Sig Dispense Refill   allopurinol (ZYLOPRIM) 300 MG tablet TAKE 1 TABLET BY MOUTH EVERY DAY 90 tablet 1   antiseptic oral rinse (BIOTENE) LIQD 15 MLS BY MOUTH RINSE ROUTE AS NEEDED FOR DRY MOUTH. 473 mL 0   atorvastatin (LIPITOR) 40 MG tablet TAKE  1 TABLET BY MOUTH EVERY DAY 90 tablet 1   benztropine (COGENTIN) 1 MG tablet Take 1 tablet (1 mg total) by mouth daily. 90 tablet 0   divalproex (DEPAKOTE ER) 500 MG 24 hr tablet Take 1 tablet (500 mg total) by mouth 2 (two) times daily. 60 tablet 1   ELIQUIS 5 MG TABS tablet TAKE 1 TABLET BY MOUTH TWICE A DAY 60 tablet 1   HYDROcodone-acetaminophen  (NORCO) 5-325 MG tablet Take 1 tablet by mouth every 6 (six) hours as needed for moderate pain. 30 tablet 0   levothyroxine (SYNTHROID) 100 MCG tablet TAKE 1 TABLET BY MOUTH EVERY DAY 90 tablet 1   lidocaine (LIDODERM) 5 % Place 1 patch onto the skin daily. Remove & Discard patch within 12 hours or as directed by MD (Patient not taking: Reported on 04/02/2022) 30 patch 0   ONETOUCH VERIO test strip TEST UP TO 4 TIMES A DAY (Patient not taking: Reported on 03/05/2022) 100 strip 2   perphenazine (TRILAFON) 8 MG tablet Take 3 tablets (24 mg total) by mouth at bedtime. 270 tablet 0   phenelzine (NARDIL) 15 MG tablet Take 3 tablets (45 mg total) by mouth 2 (two) times daily. 540 tablet 0   sildenafil (VIAGRA) 25 MG tablet Take 1 tablet (25 mg total) by mouth daily as needed for erectile dysfunction. 10 tablet 0   No current facility-administered medications for this visit.    Objective: BP 135/80   Pulse 76   Ht '5\' 10"'$  (1.778 m)   Wt 260 lb 6.4 oz (118.1 kg)   SpO2 95%   BMI 37.36 kg/m  Gen: NAD, alert, cooperative with exam*** HEENT: NCAT, EOMI, PERRL CV: RRR, good S1/S2, no murmur Resp: CTABL, no wheezes, non-labored Abd: Soft, Non Tender, Non Distended, BS present, no guarding or organomegaly Genital Exam: {male genital:311510} Ext: No edema, warm Neuro: Alert and oriented, No gross deficits   Assessment/Plan:  No problem-specific Assessment & Plan notes found for this encounter.   No orders of the defined types were placed in this encounter.   No orders of the defined types were placed in this encounter.    Rise Patience, DO, PGY-*** 09/08/2022 11:09 AM

## 2022-09-09 ENCOUNTER — Telehealth: Payer: Self-pay | Admitting: Family Medicine

## 2022-09-09 LAB — HEMOGLOBIN A1C
Est. average glucose Bld gHb Est-mCnc: 212 mg/dL
Hgb A1c MFr Bld: 9 % — ABNORMAL HIGH (ref 4.8–5.6)

## 2022-09-09 LAB — LIPID PANEL
Chol/HDL Ratio: 3.9 ratio (ref 0.0–5.0)
Cholesterol, Total: 126 mg/dL (ref 100–199)
HDL: 32 mg/dL — ABNORMAL LOW (ref >39)
LDL Chol Calc (NIH): 64 mg/dL (ref 0–99)
Triglycerides: 178 mg/dL — ABNORMAL HIGH (ref 0–149)
VLDL Cholesterol Cal: 30 mg/dL (ref 5–40)

## 2022-09-09 LAB — MICROALBUMIN / CREATININE URINE RATIO
Creatinine, Urine: 330.4 mg/dL
Microalb/Creat Ratio: 14 mg/g creat (ref 0–29)
Microalbumin, Urine: 47.1 ug/mL

## 2022-09-09 NOTE — Assessment & Plan Note (Signed)
-   A1c - Lipid panel - Microalbumin

## 2022-09-09 NOTE — Telephone Encounter (Signed)
Attempted to call patient regarding A1c results, but was unable to access patient so left HIPAA compliant voicemail.  A1c elevated to 9, previously well controlled. Documented history of allergy to metformin and will need to discuss with patient medication options. Patient instructed to call the office back.  If unable to get back with me in clinic, can consider setting patient up with pharmacy team and consideration for if he qualifies for the CGM study.   Anthony Strader, DO

## 2022-09-09 NOTE — Assessment & Plan Note (Signed)
Lipid panel 

## 2022-09-09 NOTE — Assessment & Plan Note (Signed)
Improving, working with dermatologist

## 2022-09-11 ENCOUNTER — Other Ambulatory Visit (HOSPITAL_COMMUNITY): Payer: Self-pay | Admitting: Psychiatry

## 2022-09-11 ENCOUNTER — Encounter: Payer: Self-pay | Admitting: Family Medicine

## 2022-09-11 DIAGNOSIS — F339 Major depressive disorder, recurrent, unspecified: Secondary | ICD-10-CM

## 2022-09-22 ENCOUNTER — Ambulatory Visit (INDEPENDENT_AMBULATORY_CARE_PROVIDER_SITE_OTHER): Payer: Medicare HMO | Admitting: Pharmacist

## 2022-09-22 ENCOUNTER — Encounter: Payer: Self-pay | Admitting: Pharmacist

## 2022-09-22 VITALS — BP 133/83 | HR 67 | Wt 264.0 lb

## 2022-09-22 DIAGNOSIS — E119 Type 2 diabetes mellitus without complications: Secondary | ICD-10-CM | POA: Diagnosis not present

## 2022-09-22 MED ORDER — OZEMPIC (0.25 OR 0.5 MG/DOSE) 2 MG/3ML ~~LOC~~ SOPN: 0.2500 mg | PEN_INJECTOR | SUBCUTANEOUS | 1 refills | Status: DC

## 2022-09-22 NOTE — Assessment & Plan Note (Signed)
Diabetes longstanding currently uncontrolled due to elevated A1c at last visit. Patient is able to verbalize appropriate hypoglycemia management plan.   -Started GLP-1 Ozempic (semaglutide) 0.25 mg weekly.  -Educated patient on proper injection technique using Ozempic -Patient educated on purpose and potential adverse effects of Ozempic (nausea, vomiting).  -Extensively discussed pathophysiology of diabetes, recommended lifestyle interventions, dietary effects on blood sugar control.  -Counseled on s/sx of and management of hypoglycemia.  -Next A1c anticipated 12/2022.

## 2022-09-22 NOTE — Patient Instructions (Addendum)
It was nice to see you today!  Your goal blood sugar is 80-130 before eating and less than 180 after eating.  Medication Changes: START Ozempic 0.25 mg once weekly  Monitor blood sugars at home and keep a log (glucometer or piece of paper) to bring with you to your next visit.  Keep up the good work with diet and exercise. Aim for a diet full of vegetables, fruit and lean meats (chicken, [place], fish). Try to limit salt intake by eating fresh or frozen vegetables (instead of canned), rinse canned vegetables prior to cooking and do not add any additional salt to meals.

## 2022-09-22 NOTE — Progress Notes (Signed)
S:     Chief Complaint  Patient presents with   Medication Management    T2DM   62 y.o. male who presents for diabetes evaluation, education, and management.  PMH is significant for HLD, T2DM, Afib, Schizoaffective disorder .  Patient was referred and last seen by Primary Care Provider, Dr. Oleh Genin, on 09/08/22.  At last visit, A1c was found to be elevated at 9.0  Today, patient arrives in good spirits and presents without any assistance. Has noticeable tremors.  Patient was diagnosed with T2DM in 02/2007 and managed without medications for many years (intolerant to metformin).   Family/social history: States other family members also have diabetes.  Current diabetes medications include: none Current hypertension medications include: none Current hyperlipidemia medications include: atorvastatin 40 mg daily  Patient reports adherence to taking all medications as prescribed.    Do you feel that your medications are working for you? Yes Have you been experiencing any side effects to the medications prescribed? no Do you have any problems obtaining medications due to transportation or finances? no Insurance coverage: Aetna Medicare  Patient denies hypoglycemic events.   Patient denies nocturia (nighttime urination).  Patient denies neuropathy (nerve pain). Patient denies visual changes. Patient denies self foot exams.   Patient reported dietary habits: Eats carbs 2-3 times per week. Drinks water often but also drinks sodas  Within the past 12 months, did you worry whether your food would run out before you got money to buy more? no Within the past 12 months, did the food you bought run out, and you didn't have money to get more? no   O:   Review of Systems  Neurological:  Positive for tremors.  All other systems reviewed and are negative.   Physical Exam Constitutional:      Appearance: Normal appearance.  Pulmonary:     Effort: Pulmonary effort is normal.   Neurological:     Mental Status: He is alert.  Psychiatric:        Mood and Affect: Mood normal.        Behavior: Behavior normal.        Thought Content: Thought content normal.        Judgment: Judgment normal.      Lab Results  Component Value Date   HGBA1C 9.0 (H) 09/08/2022   Vitals:   09/22/22 1412  BP: 133/83  Pulse: 67  SpO2: 93%    Lipid Panel     Component Value Date/Time   CHOL 126 09/08/2022 1525   TRIG 178 (H) 09/08/2022 1525   HDL 32 (L) 09/08/2022 1525   CHOLHDL 3.9 09/08/2022 1525   CHOLHDL 8.6 (H) 07/16/2016 1446   VLDL 60 (H) 07/16/2016 1446   LDLCALC 64 09/08/2022 1525    Clinical Atherosclerotic Cardiovascular Disease (ASCVD): No    A/P: Diabetes longstanding currently uncontrolled due to elevated A1c at last visit. Patient is able to verbalize appropriate hypoglycemia management plan.   -Started GLP-1 Ozempic (semaglutide) 0.25 mg weekly.  -Educated patient on proper injection technique using Ozempic -Patient educated on purpose and potential adverse effects of Ozempic (nausea, vomiting).  -Extensively discussed pathophysiology of diabetes, recommended lifestyle interventions, dietary effects on blood sugar control.  -Counseled on s/sx of and management of hypoglycemia.  -Next A1c anticipated 12/2022.    Written patient instructions provided. Patient verbalized understanding of treatment plan.  Total time in face to face counseling 40 minutes.    Follow-up:  Pharmacist 10/19/22. Patient seen with Louanne Belton  PharmD PGY-1 Pharmacy Resident and Estelle June, PharmD Candidate.

## 2022-09-23 NOTE — Progress Notes (Signed)
Reviewed and agree with Dr Koval's plan.   

## 2022-09-24 ENCOUNTER — Telehealth: Payer: Self-pay

## 2022-09-24 NOTE — Telephone Encounter (Signed)
Patient calls nurse line requesting to speak with Dr. Valentina Lucks.   He reports having some difficulty with Ozempic pen. He reports he was seen recently and was taught how to self administer. He reports he "feels" he did everything he was supposed to do, however reports "no medication" came out of the pen when he attempted injection today.  He is unsure if he should attempt to give himself a second injection.   Will forward to Dr. Valentina Lucks for assistance.

## 2022-09-24 NOTE — Telephone Encounter (Signed)
Phone call to patient, reviewed steps to administration.   Appears patient did all steps to the injection.   He believed that the plunger in the pen should have moved further down the pen.  I assured him that the pen would contain 8 doses of his currently prescribed dose of 0.25mg .   He was relieved and plans to do next dose in 1 week.   He thanked me for the follow-up.

## 2022-09-25 NOTE — Telephone Encounter (Signed)
Reviewed and agree with Dr Koval's plan.   

## 2022-09-30 DIAGNOSIS — L578 Other skin changes due to chronic exposure to nonionizing radiation: Secondary | ICD-10-CM | POA: Diagnosis not present

## 2022-09-30 DIAGNOSIS — L820 Inflamed seborrheic keratosis: Secondary | ICD-10-CM | POA: Diagnosis not present

## 2022-09-30 DIAGNOSIS — Z08 Encounter for follow-up examination after completed treatment for malignant neoplasm: Secondary | ICD-10-CM | POA: Diagnosis not present

## 2022-09-30 DIAGNOSIS — L538 Other specified erythematous conditions: Secondary | ICD-10-CM | POA: Diagnosis not present

## 2022-09-30 DIAGNOSIS — R208 Other disturbances of skin sensation: Secondary | ICD-10-CM | POA: Diagnosis not present

## 2022-09-30 DIAGNOSIS — Z85828 Personal history of other malignant neoplasm of skin: Secondary | ICD-10-CM | POA: Diagnosis not present

## 2022-09-30 DIAGNOSIS — L298 Other pruritus: Secondary | ICD-10-CM | POA: Diagnosis not present

## 2022-09-30 DIAGNOSIS — L821 Other seborrheic keratosis: Secondary | ICD-10-CM | POA: Diagnosis not present

## 2022-09-30 DIAGNOSIS — L700 Acne vulgaris: Secondary | ICD-10-CM | POA: Diagnosis not present

## 2022-09-30 DIAGNOSIS — Z789 Other specified health status: Secondary | ICD-10-CM | POA: Diagnosis not present

## 2022-09-30 DIAGNOSIS — L814 Other melanin hyperpigmentation: Secondary | ICD-10-CM | POA: Diagnosis not present

## 2022-09-30 DIAGNOSIS — D1801 Hemangioma of skin and subcutaneous tissue: Secondary | ICD-10-CM | POA: Diagnosis not present

## 2022-10-01 ENCOUNTER — Other Ambulatory Visit: Payer: Self-pay | Admitting: Family Medicine

## 2022-10-01 DIAGNOSIS — M109 Gout, unspecified: Secondary | ICD-10-CM

## 2022-10-10 ENCOUNTER — Other Ambulatory Visit: Payer: Self-pay | Admitting: Family Medicine

## 2022-10-19 ENCOUNTER — Ambulatory Visit (INDEPENDENT_AMBULATORY_CARE_PROVIDER_SITE_OTHER): Payer: Medicare HMO | Admitting: Pharmacist

## 2022-10-19 ENCOUNTER — Other Ambulatory Visit (HOSPITAL_COMMUNITY): Payer: Self-pay | Admitting: Psychiatry

## 2022-10-19 VITALS — BP 123/88 | HR 90 | Wt 249.0 lb

## 2022-10-19 DIAGNOSIS — E119 Type 2 diabetes mellitus without complications: Secondary | ICD-10-CM | POA: Diagnosis not present

## 2022-10-19 MED ORDER — APIXABAN 5 MG PO TABS: 5.0000 mg | ORAL_TABLET | Freq: Two times a day (BID) | ORAL | 2 refills | Status: DC

## 2022-10-19 NOTE — Assessment & Plan Note (Signed)
Diabetes longstanding currently uncontrolled but improving with recent weight loss after starting semaglutide. Patient is able to verbalize appropriate hypoglycemia management plan. Medication adherence appears good. -Continued GLP-1 0.25 mg Ozempic (semaglutide) once weekly.   -Patient educated on purpose, proper use, and potential adverse effects of Ozempic (nausea, vomiting, appetite suppression).  -Extensively discussed pathophysiology of diabetes, recommended lifestyle interventions, dietary effects on blood sugar control.  -Counseled on s/sx of and management of hypoglycemia.  -Next A1c anticipated 12/2022.

## 2022-10-19 NOTE — Progress Notes (Signed)
Reviewed and agree with Dr Koval's plan.   

## 2022-10-19 NOTE — Patient Instructions (Addendum)
It was nice to see you today!  Your goal blood sugar is 80-130 before eating and less than 180 after eating.  We have no changes to make to your medications today.   Monitor blood sugars at home and keep a log (glucometer or piece of paper) to bring with you to your next visit.  Keep up the good work with diet and exercise. Aim for a diet full of vegetables, fruit and lean meats (chicken, [place], fish). Try to limit salt intake by eating fresh or frozen vegetables (instead of canned), rinse canned vegetables prior to cooking and do not add any additional salt to meals.     Follow up with Dr. Clayborne Artist on May 14th, 2024 at 10:30 AM

## 2022-10-19 NOTE — Progress Notes (Signed)
S:     Chief Complaint  Patient presents with   Medication Management    DM2   62 y.o. male who presents for diabetes evaluation, education, and management.  PMH is significant for HLD, DM2, afib, schizoaffective disorder.  Patient was referred and last seen by Primary Care Provider, Dr. Clayborne Artist, on 09/08/22.   At last visit, patient was started on Ozempic (semaglutide) 0.25 mg once weekly.   Since his last visit, he reports that he has noticed a decrease in his appetite. He has also lost about 15 lbs since March. He verbalizes his goal is to decrease his weight to 200 lbs.   Today, patient arrives in good spirits and presents without any assistance. Presents with tremors.   Patient reports Diabetes was diagnosed in 2008.   Family/Social History: other family members have diabetes   Current diabetes medications include: Ozempic (semaglutide) 0.25 mg weekly Current hyperlipidemia medications include: atorvastatin 40 mg daily   Patient reports adherence to taking all medications as prescribed. Administers semaglutide every Thursday. He verbalizes concern that he has not been receiving doses of the semaglutide since he could not see the needle. Given that the patient has notable weight loss and appetite suppression, less concerned that patient is administering semaglutide improperly.   Do you feel that your medications are working for you? yes Have you been experiencing any side effects to the medications prescribed? no Do you have any problems obtaining medications due to transportation or finances? no Insurance coverage: Aetna Medicare   Patient denies hypoglycemic events.   Patient denies nocturia (nighttime urination).  Patient denies neuropathy (nerve pain). Patient denies visual changes. Patient denies self foot exams.    O:   Review of Systems  Neurological:  Positive for tremors.    Physical Exam Vitals reviewed.  Constitutional:      Appearance: Normal  appearance.  Cardiovascular:     Rate and Rhythm: Normal rate.  Neurological:     Mental Status: He is alert and oriented to person, place, and time.     Comments: Patient presents with baseline tremor   Psychiatric:        Mood and Affect: Mood normal.        Behavior: Behavior normal.        Thought Content: Thought content normal.        Judgment: Judgment normal.      Lab Results  Component Value Date   HGBA1C 9.0 (H) 09/08/2022   Vitals:   10/19/22 1116 10/19/22 1125  BP: (!) 124/91 123/88  Pulse: 90   SpO2: 95%     Lipid Panel     Component Value Date/Time   CHOL 126 09/08/2022 1525   TRIG 178 (H) 09/08/2022 1525   HDL 32 (L) 09/08/2022 1525   CHOLHDL 3.9 09/08/2022 1525   CHOLHDL 8.6 (H) 07/16/2016 1446   VLDL 60 (H) 07/16/2016 1446   LDLCALC 64 09/08/2022 1525    Clinical Atherosclerotic Cardiovascular Disease (ASCVD): No  The ASCVD Risk score (Arnett DK, et al., 2019) failed to calculate for the following reasons:   The valid total cholesterol range is 130 to 320 mg/dL    A/P: Diabetes longstanding currently uncontrolled but improving with recent weight loss after starting semaglutide. Patient is able to verbalize appropriate hypoglycemia management plan. Medication adherence appears good. -Continued GLP-1 0.25 mg Ozempic (semaglutide) once weekly.   -Patient educated on purpose, proper use, and potential adverse effects of Ozempic (nausea, vomiting, appetite suppression).  -  Extensively discussed pathophysiology of diabetes, recommended lifestyle interventions, dietary effects on blood sugar control.  -Counseled on s/sx of and management of hypoglycemia.  -Next A1c anticipated 12/2022.   ASCVD risk - primary prevention in patient with diabetes. Last LDL of 64 at goal of <70 mg/dL. High intensity statin indicated.  -Continued atorvastatin 40 mg daily.   Chronic Afib taking apixaban. Refilled apixaban  BID   Written patient instructions provided.  Patient verbalized understanding of treatment plan.  Total time in face to face counseling 30 minutes.    Follow-up:  PCP clinic visit in 11/17/22.  Patient seen with Jerry Caras, PharmD PGY-1 Pharmacy Resident and Revonda Standard, PharmD Candidate.

## 2022-10-20 ENCOUNTER — Other Ambulatory Visit (HOSPITAL_COMMUNITY): Payer: Self-pay | Admitting: *Deleted

## 2022-10-20 ENCOUNTER — Telehealth (HOSPITAL_COMMUNITY): Payer: Self-pay | Admitting: *Deleted

## 2022-10-20 DIAGNOSIS — F339 Major depressive disorder, recurrent, unspecified: Secondary | ICD-10-CM

## 2022-10-20 MED ORDER — DIVALPROEX SODIUM ER 500 MG PO TB24: 500.0000 mg | ORAL_TABLET | Freq: Two times a day (BID) | ORAL | 0 refills | Status: DC

## 2022-10-20 NOTE — Telephone Encounter (Signed)
Pt's mother LVM that pt needed refill of the Depakote prior to next appointment on 11/12/22. Bridge sent to [facility] on [address] [place]. Writer spoke with pt who says that he has been feeling "confused" and having memory problems lately. Pt diabetes monitored by provider but he says he does not have any way to check CBG at home. Last Valproic Acid level was done on 06/07/22 and was resulted at 37.2 mcg/ml.FYI.

## 2022-10-20 NOTE — Telephone Encounter (Signed)
Need bridge.

## 2022-10-20 NOTE — Telephone Encounter (Signed)
Last Depakote script was filled 08/13/22 for #60 (takes BID) with one refill. Appointment is not until 11/12/22. No early fill.

## 2022-10-20 NOTE — Telephone Encounter (Signed)
Has been sent.

## 2022-10-20 NOTE — Telephone Encounter (Signed)
Please inform pharmacy that he should not get early refills of the medication.

## 2022-11-11 ENCOUNTER — Other Ambulatory Visit (HOSPITAL_COMMUNITY): Payer: Self-pay | Admitting: Psychiatry

## 2022-11-11 DIAGNOSIS — F339 Major depressive disorder, recurrent, unspecified: Secondary | ICD-10-CM

## 2022-11-11 DIAGNOSIS — R251 Tremor, unspecified: Secondary | ICD-10-CM

## 2022-11-12 ENCOUNTER — Other Ambulatory Visit (HOSPITAL_COMMUNITY): Payer: Self-pay | Admitting: *Deleted

## 2022-11-12 ENCOUNTER — Ambulatory Visit (HOSPITAL_COMMUNITY): Payer: Medicare HMO | Admitting: Psychiatry

## 2022-11-12 DIAGNOSIS — F339 Major depressive disorder, recurrent, unspecified: Secondary | ICD-10-CM

## 2022-11-12 DIAGNOSIS — R251 Tremor, unspecified: Secondary | ICD-10-CM

## 2022-11-12 MED ORDER — BENZTROPINE MESYLATE 1 MG PO TABS: 1.0000 mg | ORAL_TABLET | Freq: Every day | ORAL | 0 refills | Status: DC

## 2022-11-13 ENCOUNTER — Telehealth: Payer: Self-pay

## 2022-11-13 NOTE — Telephone Encounter (Signed)
Patients mother LVM on nurse line in regards to Ozempic.   She reports one of the pen needles "broke" and he is not able to give himself this weeks injection.   I attempted to call patient back, however no answer.   Are we able to send in just the pen needles?   Will forward to PCP.

## 2022-11-16 ENCOUNTER — Telehealth (HOSPITAL_COMMUNITY): Payer: Self-pay | Admitting: *Deleted

## 2022-11-16 NOTE — Telephone Encounter (Signed)
Cannot prescribe higher dose of Cogentin.  It will cause constipation, blurry vision and anticholinergic side effects.  He need to see the neurology.

## 2022-11-16 NOTE — Telephone Encounter (Signed)
Writer spoke with pt who has c/o increased tremors in hands and knees he says. Pt is taking the Cogentin 1 mg QD but says it's not helping anymore and his very bothered by the tremors. Pt has an appointment on 12/03/22 but is requesting to come in earlier if possible. Please review and advise.

## 2022-11-17 ENCOUNTER — Encounter: Payer: Self-pay | Admitting: Family Medicine

## 2022-11-17 ENCOUNTER — Ambulatory Visit (INDEPENDENT_AMBULATORY_CARE_PROVIDER_SITE_OTHER): Payer: Medicare HMO | Admitting: Family Medicine

## 2022-11-17 VITALS — BP 124/78 | HR 69 | Ht 70.0 in | Wt 243.2 lb

## 2022-11-17 DIAGNOSIS — F251 Schizoaffective disorder, depressive type: Secondary | ICD-10-CM

## 2022-11-17 DIAGNOSIS — E119 Type 2 diabetes mellitus without complications: Secondary | ICD-10-CM

## 2022-11-17 NOTE — Progress Notes (Signed)
    SUBJECTIVE:   CHIEF COMPLAINT / HPI:   T2DM - Was having issues with Ozempic pen - Was able to get pen done in clinic today with Dr. Estil Daft - Tremors have been slowly worsening - Currently psychiatric medications are managed by psychiatrist, patient is thinking of switching to a different one - Has had issues with his psychiatric medications in the past not working - Saw neurologist back in August 2023 but has not followed up since  Abnormal breathing sound - Patient is unsure but thinks he might have some wheezing when he is laying down - No history of asthma or other medical conditions - Does have a father and brother that have sleep apnea with CPAP machines - Denies any apneic episodes at night that he has been told  PERTINENT  PMH / PSH: Reviewed  OBJECTIVE:   BP 124/78   Pulse 69   Ht 5\' 10"  (1.778 m)   Wt 243 lb 3.2 oz (110.3 kg)   SpO2 94%   BMI 34.90 kg/m   Gen: well-appearing, NAD CV: RRR, no m/r/g appreciated, no peripheral edema Pulm: CTAB, no wheezes/crackles GI: soft, non-tender, non-distended  ASSESSMENT/PLAN:   T2DM (type 2 diabetes mellitus) (HCC) Patient now on semaglutide, has lost 20 pounds in the last 2 months.  Denies any hypoglycemic symptoms.  Had issues with his Ozempic pen, but has now resolved and a dose was administered in clinic. - Continue Ozempic 0.25 mg once weekly - Educated on hypoglycemic symptoms - Follow-up in 1 month for A1c  Schizoaffective disorder, depressive type [facility]) Currently following with psychiatrist but is willing to switch to another one for medication management.  Patient dealing with significant tremors, partially secondary to some of his medications that he is on, but patient notes difficulty with controlling his schizophrenic symptoms a different regimen. - Referral for psychiatrist per patient request   Tremors Likely secondary to current psychiatric medications, patient has not followed up with  neurology since initial visit - Recommend patient call neurologist office for follow-up  Evelena Leyden, DO [facility]

## 2022-11-17 NOTE — Assessment & Plan Note (Signed)
Currently following with psychiatrist but is willing to switch to another one for medication management.  Patient dealing with significant tremors, partially secondary to some of his medications that he is on, but patient notes difficulty with controlling his schizophrenic symptoms a different regimen. - Referral for psychiatrist per patient request

## 2022-11-17 NOTE — Telephone Encounter (Signed)
Pt is going to his PCP today and will talk to them about a Neurology appointment. Pt will keep [place] updated.

## 2022-11-17 NOTE — Patient Instructions (Signed)
I have sent in a referral for a new psychiatrist  Please call the neurology office to get another appointment  Follow up with me in 1 month for your diabetes check up

## 2022-11-17 NOTE — Assessment & Plan Note (Signed)
Patient now on semaglutide, has lost 20 pounds in the last 2 months.  Denies any hypoglycemic symptoms.  Had issues with his Ozempic pen, but has now resolved and a dose was administered in clinic. - Continue Ozempic 0.25 mg once weekly - Educated on hypoglycemic symptoms - Follow-up in 1 month for A1c

## 2022-11-19 ENCOUNTER — Telehealth: Payer: Self-pay | Admitting: Family Medicine

## 2022-11-19 NOTE — Telephone Encounter (Signed)
Called patient to schedule Medicare Annual Wellness Visit (AWV). Left message for patient to call back and schedule Medicare Annual Wellness Visit (AWV).  Last date of AWV:  AWVI eligible as of 07/06/2009  Please schedule an AWVI appointment at any time with [facility] ANNUAL WELLNESS VISIT.  If any questions, please contact me at [phone].    Thank you,  Imara Standiford  Ambulatory Clinic Support [facility] Direct dial  [phone]   

## 2022-11-25 ENCOUNTER — Telehealth (HOSPITAL_COMMUNITY): Payer: Self-pay

## 2022-11-25 ENCOUNTER — Other Ambulatory Visit (HOSPITAL_COMMUNITY): Payer: Self-pay | Admitting: Psychiatry

## 2022-11-25 DIAGNOSIS — F339 Major depressive disorder, recurrent, unspecified: Secondary | ICD-10-CM

## 2022-11-25 MED ORDER — DIVALPROEX SODIUM ER 500 MG PO TB24
500.0000 mg | ORAL_TABLET | Freq: Two times a day (BID) | ORAL | 0 refills | Status: DC
Start: 2022-11-25 — End: 2023-01-25

## 2022-11-25 MED ORDER — PERPHENAZINE 8 MG PO TABS: 24.0000 mg | ORAL_TABLET | Freq: Every day | ORAL | 0 refills | Status: DC

## 2022-11-25 NOTE — Telephone Encounter (Signed)
Patient called and would like a refill on his Depakote and his Perpheazine, pharmacy is the same. Patient has a follow up on 5/30

## 2022-11-25 NOTE — Telephone Encounter (Signed)
Done

## 2022-11-27 ENCOUNTER — Other Ambulatory Visit: Payer: Self-pay | Admitting: Family Medicine

## 2022-12-03 ENCOUNTER — Ambulatory Visit (HOSPITAL_COMMUNITY): Payer: Medicare HMO | Admitting: Psychiatry

## 2022-12-10 ENCOUNTER — Other Ambulatory Visit (HOSPITAL_COMMUNITY): Payer: Self-pay | Admitting: Psychiatry

## 2022-12-10 DIAGNOSIS — R251 Tremor, unspecified: Secondary | ICD-10-CM

## 2022-12-10 DIAGNOSIS — F339 Major depressive disorder, recurrent, unspecified: Secondary | ICD-10-CM

## 2022-12-11 ENCOUNTER — Encounter: Payer: Self-pay | Admitting: Pharmacist

## 2022-12-11 NOTE — Progress Notes (Signed)
[facility]) [facility] Quality Pharmacy Team Statin Quality Measure Assessment  12/11/2022  NEPHI SAVAGE [birth year] [id number]  Per review of chart and payor information, Mr. Scurlock has a diagnosis of diabetes but is not currently filling a statin prescription.  This places patient into the Statin Use In Patients with Diabetes (SUPD) measure for CMS. Atorvastatin is listed as an active medication but has not been filled since October 2023.  If clinically appropriate, please encourage patient to fill his statin at [facility] office visit!    Please consider ONE of the following recommendations:  Initiate high intensity statin Atorvastatin 40 mg once daily, #90, 3 refills   Rosuvastatin 20 mg once daily, #90, 3 refills    Initiate moderate intensity          statin with reduced frequency if prior          statin intolerance 1x weekly, #13, 3 refills   2x weekly, #26, 3 refills   3x weekly, #39, 3 refills    Code for past statin intolerance or  other exclusions (required annually)  Provider Requirements: Associate code during an office visit or telehealth encounter  Drug Induced Myopathy G72.0   Myopathy, unspecified G72.9   Myositis, unspecified M60.9   Rhabdomyolysis M62.82   Cirrhosis of liver K74.69   Prediabetes R73.03   PCOS E28.2   Thank you for allowing [facility] pharmacy to be a part of this patient's care.  Dellie Burns, PharmD [facility]  [facility] Clinical Pharmacist Office: [phone]

## 2022-12-14 ENCOUNTER — Encounter: Payer: Self-pay | Admitting: Family Medicine

## 2022-12-14 ENCOUNTER — Other Ambulatory Visit (HOSPITAL_COMMUNITY): Payer: Self-pay

## 2022-12-14 ENCOUNTER — Ambulatory Visit (INDEPENDENT_AMBULATORY_CARE_PROVIDER_SITE_OTHER): Payer: Medicare HMO | Admitting: Family Medicine

## 2022-12-14 VITALS — BP 123/62 | HR 81 | Ht 70.0 in | Wt 237.6 lb

## 2022-12-14 DIAGNOSIS — Z6834 Body mass index (BMI) 34.0-34.9, adult: Secondary | ICD-10-CM | POA: Diagnosis not present

## 2022-12-14 DIAGNOSIS — R42 Dizziness and giddiness: Secondary | ICD-10-CM

## 2022-12-14 DIAGNOSIS — R142 Eructation: Secondary | ICD-10-CM | POA: Diagnosis not present

## 2022-12-14 DIAGNOSIS — R251 Tremor, unspecified: Secondary | ICD-10-CM

## 2022-12-14 DIAGNOSIS — E119 Type 2 diabetes mellitus without complications: Secondary | ICD-10-CM

## 2022-12-14 DIAGNOSIS — F339 Major depressive disorder, recurrent, unspecified: Secondary | ICD-10-CM

## 2022-12-14 LAB — POCT GLYCOSYLATED HEMOGLOBIN (HGB A1C): HbA1c, POC (prediabetic range): 6.4 % (ref 5.7–6.4)

## 2022-12-14 MED ORDER — PANTOPRAZOLE SODIUM 20 MG PO TBEC: 20.0000 mg | DELAYED_RELEASE_TABLET | Freq: Every day | ORAL | 0 refills | Status: DC

## 2022-12-14 MED ORDER — BENZTROPINE MESYLATE 1 MG PO TABS
1.00 mg | ORAL_TABLET | Freq: Every day | ORAL | 0 refills | Status: DC
Start: 2022-12-14 — End: 2023-01-11

## 2022-12-14 NOTE — Assessment & Plan Note (Signed)
A1c improved to 6.4 today.  Patient is stable on Ozempic 0.25 mg weekly, he is still losing weight at this dose.  Do wonder if patient's intake is not necessarily adequate while on this.  Discussed the side effects of muscle loss and importance of resistance training. - Follow-up in 3 months - Continue Ozempic 0.25 mg weekly

## 2022-12-14 NOTE — Assessment & Plan Note (Signed)
Persistent belching and also has slight cough present at night, do wonder if this is a reflux picture.  Would anticipate that with the Ozempic gastric emptying slowing that it could theoretically worsen reflux.  Will do a treatment with Protonix. - Omeprazole 20 mg daily x 8 weeks - Patient to follow-up in the next 1 to 2 weeks if no improvement or if worsening, would consider trial of H2 blocker

## 2022-12-14 NOTE — Progress Notes (Signed)
    SUBJECTIVE:   CHIEF COMPLAINT / HPI:   T2DM - Medications: Ozempic 0.25 mg weekly, atorvastatin 40 mg daily - Compliance: Good - eye exam: Reports eye exam in fall of 2023 - foot exam:  12/18/2020 - microalbumin: 47.1 on 09/08/2022   - denies symptoms of hypoglycemia, polyuria, polydipsia, numbness extremities, foot ulcers/trauma  Belching - Has been happening for a few weeks - Increased belching that smells like eggs - Has had this before, unsure of what made it go away - Does report slight cough at night  Vertigo - Present for the last week or so - Feels like the room is spinning and will fall over - First episode happened while [place] - Has started to get a bit better   PERTINENT  PMH / PSH: Reviewed  OBJECTIVE:   BP 123/62   Pulse 81   Ht 5\' 10"  (1.778 m)   Wt 237 lb 9.6 oz (107.8 kg)   SpO2 95%   BMI 34.09 kg/m   Gen: well-appearing, NAD CV: irregularly irregular, no m/r/g appreciated, no peripheral edema Pulm: CTAB, no wheezes/crackles  ASSESSMENT/PLAN:   T2DM (type 2 diabetes mellitus) (HCC) A1c improved to 6.4 today.  Patient is stable on Ozempic 0.25 mg weekly, he is still losing weight at this dose.  Do wonder if patient's intake is not necessarily adequate while on this.  Discussed the side effects of muscle loss and importance of resistance training. - Follow-up in 3 months - Continue Ozempic 0.25 mg weekly  Vertigo Recent onset of vertigo.  Also in the setting of recent significant weight loss and decreased appetite and water intake secondary to Ozempic and his psych medications.  Do wonder if patient is not maintaining his caloric needs and that is contributing.  Reassuringly, patient reports that this has been improving.  Seems to be worse with sitting to standing, orthostatic vitals were abnormal and not clear if afib or tremors could have been interfering with machine reads but reassuringly had normal sitting BP on  repeat - Reinforced importance of adequate water and protein intake - Discussed ER and return precautions - Follow-up with me in 1 week  Belching Persistent belching and also has slight cough present at night, do wonder if this is a reflux picture.  Would anticipate that with the Ozempic gastric emptying slowing that it could theoretically worsen reflux.  Will do a treatment with Protonix. - Omeprazole 20 mg daily x 8 weeks - Patient to follow-up in the next 1 to 2 weeks if no improvement or if worsening, would consider trial of H2 blocker    Ephraim Reichel, DO [facility]

## 2022-12-14 NOTE — Patient Instructions (Addendum)
Today we discussed the following:  Diabetes - Your A1c was 6.4, which is amazing!!! - We are going to keep you on the same dose of your Ozempic - Make sure to get your next eye exam done this year - It is going to be important to maintain your muscle while you are on this medication, I recommend you start doing some resistance training exercises such as inclined push-ups, squats down to your seat, and light weight arm exercises as well.  Burping - I wonder if this is partially acid reflux - I have sent in a medication for you to take once a day for the next 8 weeks call Protonix - You should notice some improvement over the next week, if not then please let me know  Vertigo - It is going to be very important to make sure you are drinking enough water and eating protein - Ozempic will decrease your appetite and it will be important that we are still maintaining enough sustenance did not become dizzy   Protein foods include: Meat, fish, poultry, eggs, dairy foods, and beans such as pinto and kidney beans (beans also provide carbohydrate).

## 2022-12-14 NOTE — Assessment & Plan Note (Addendum)
Recent onset of vertigo.  Also in the setting of recent significant weight loss and decreased appetite and water intake secondary to Ozempic and his psych medications.  Do wonder if patient is not maintaining his caloric needs and that is contributing.  Reassuringly, patient reports that this has been improving.  Seems to be worse with sitting to standing, orthostatic vitals were abnormal and not clear if afib or tremors could have been interfering with machine reads but reassuringly had normal sitting BP on repeat - Reinforced importance of adequate water and protein intake - Discussed ER and return precautions - Follow-up with me in 1 week

## 2022-12-21 ENCOUNTER — Encounter: Payer: Self-pay | Admitting: Family Medicine

## 2022-12-21 ENCOUNTER — Ambulatory Visit (INDEPENDENT_AMBULATORY_CARE_PROVIDER_SITE_OTHER): Payer: Medicare HMO | Admitting: Family Medicine

## 2022-12-21 VITALS — BP 135/85 | HR 92 | Ht 70.0 in | Wt 238.2 lb

## 2022-12-21 DIAGNOSIS — K5909 Other constipation: Secondary | ICD-10-CM | POA: Diagnosis not present

## 2022-12-21 DIAGNOSIS — Z1211 Encounter for screening for malignant neoplasm of colon: Secondary | ICD-10-CM | POA: Diagnosis not present

## 2022-12-21 DIAGNOSIS — R142 Eructation: Secondary | ICD-10-CM

## 2022-12-21 DIAGNOSIS — R42 Dizziness and giddiness: Secondary | ICD-10-CM

## 2022-12-21 MED ORDER — MECLIZINE HCL 12.5 MG PO TABS: 12.5000 mg | ORAL_TABLET | Freq: Two times a day (BID) | ORAL | 0 refills | Status: DC | PRN

## 2022-12-21 NOTE — Assessment & Plan Note (Signed)
Has improved with treatment of omeprazole, was likely related to GERD.  Will continue and finish out the 8-week course and then trial off of the medication to see if there is full resolution.

## 2022-12-21 NOTE — Progress Notes (Signed)
    SUBJECTIVE:   CHIEF COMPLAINT / HPI:   Vertigo - Will be traveling by plane to [place] in a few weeks and would like medications - Dizziness with standing has gotten better - Has flown before without issue but has been having the dizziness - Had a bad spell this last week while laying in bed   Blood pressures - felt the BP cuff was too tight when checking it the other week  Belching - Has gotten better since treatment started with omeprazole  Abnormal stools - Has been present for a few years - Sometimes goes 10 days without having a bowel movement - When he has one it may be smaller - Tried a daily Miralax  PERTINENT  PMH / PSH: Reviewed  OBJECTIVE:   BP 135/85   Pulse 92   Ht 5\' 10"  (1.778 m)   Wt 238 lb 3.2 oz (108 kg)   SpO2 96%   BMI 34.18 kg/m   General: NAD, well-appearing, well-nourished Respiratory: No respiratory distress, breathing comfortably, able to speak in full sentences Skin: warm and dry, no rashes noted on exposed skin Psych: Appropriate affect and mood  ASSESSMENT/PLAN:   Vertigo Dizziness with standing has improved per patient, most recent vertigo episodes seem to be consistent with BPPV given the occurrence at night and with turning his head.  Do still feel the patient is probably not maintaining his caloric and fluid need with his decreased appetite on the Ozempic.  Is wanting to have something he could take while he is on his plane flights next month - Referral to neurorehab for vestibular rehab - Meclizine 12.5 mg as needed for his plane flights, discussed with patient close monitoring of symptoms given his other CNS depressant medications  Chronic constipation Has been present for several years, sometimes will go over week without having a bowel movement.  When he does have a bowel movement it will not be the size he is expecting.  Previously has tried MiraLAX, but not using it regularly.  Feel that symptoms are probably worsened since  starting the Ozempic as that is a common side effect and feel that maintenance therapy to maintain bowel regimen is appropriate. - MiraLAX daily or every other day to maintain at least 3 bowel movements per week - Can increase to twice daily as needed - Discussed return precautions  Belching Has improved with treatment of omeprazole, was likely related to GERD.  Will continue and finish out the 8-week course and then trial off of the medication to see if there is full resolution.     Evelena Leyden, DO [facility]

## 2022-12-21 NOTE — Assessment & Plan Note (Signed)
Dizziness with standing has improved per patient, most recent vertigo episodes seem to be consistent with BPPV given the occurrence at night and with turning his head.  Do still feel the patient is probably not maintaining his caloric and fluid need with his decreased appetite on the Ozempic.  Is wanting to have something he could take while he is on his plane flights next month - Referral to neurorehab for vestibular rehab - Meclizine 12.5 mg as needed for his plane flights, discussed with patient close monitoring of symptoms given his other CNS depressant medications

## 2022-12-21 NOTE — Patient Instructions (Addendum)
For your poops I would recommend taking daily MiraLAX, you can take every other day to try and get your bowel movements more normal.  I am going to send you to vestibular rehab for the dizziness. I have also sent in a medication called Meclizine, which you can use for your flight to [place] and back. We are going to use a very low dose as it can make you drowsy and I don't want it to interact much with your other medications.

## 2022-12-21 NOTE — Assessment & Plan Note (Signed)
Has been present for several years, sometimes will go over week without having a bowel movement.  When he does have a bowel movement it will not be the size he is expecting.  Previously has tried MiraLAX, but not using it regularly.  Feel that symptoms are probably worsened since starting the Ozempic as that is a common side effect and feel that maintenance therapy to maintain bowel regimen is appropriate. - MiraLAX daily or every other day to maintain at least 3 bowel movements per week - Can increase to twice daily as needed - Discussed return precautions

## 2022-12-29 ENCOUNTER — Ambulatory Visit: Payer: Medicare HMO | Attending: Family Medicine | Admitting: Physical Therapy

## 2022-12-29 ENCOUNTER — Encounter: Payer: Self-pay | Admitting: Physical Therapy

## 2022-12-29 ENCOUNTER — Other Ambulatory Visit: Payer: Self-pay

## 2022-12-29 DIAGNOSIS — R2681 Unsteadiness on feet: Secondary | ICD-10-CM | POA: Diagnosis not present

## 2022-12-29 DIAGNOSIS — R42 Dizziness and giddiness: Secondary | ICD-10-CM | POA: Diagnosis not present

## 2022-12-29 NOTE — Therapy (Signed)
OUTPATIENT PHYSICAL THERAPY VESTIBULAR EVALUATION     Patient Name: [name] MRN: [id number] DOB:[birth year], 62 y.o., male Today's Date: 12/29/2022  END OF SESSION:  PT End of Session - 12/29/22 1233     Visit Number 1    Number of Visits 9    Date for PT Re-Evaluation 01/26/23    Authorization Type [employer] Medicare/Medicaid    PT Start Time 1153   pt late   PT Stop Time 1229    PT Time Calculation (min) 36 min    Activity Tolerance Patient tolerated treatment well    Behavior During Therapy WFL for tasks assessed/performed             Past Medical History:  Diagnosis Date   A-fib (HCC)    Acute right-sided low back pain    Arrhythmia    Chronic a-fib (HCC)    Closed fracture of left distal radius    Contact dermatitis 04/16/2017   Depression    Depression    Hypothyroidism    Low back pain    Morbid obesity, BMI unknown (HCC)    Osteoarthritis    oa in bilateral knees   Schizophrenia (HCC)    Seizures (HCC)    Thyroid disease    Varicose veins    Past Surgical History:  Procedure Laterality Date   ACHILLES TENDON REPAIR  approx. 2004   right foot   CATARACT EXTRACTION     bilateral   OPEN REDUCTION INTERNAL FIXATION (ORIF) DISTAL RADIAL FRACTURE Left 07/21/2018   Procedure: OPEN REDUCTION INTERNAL FIXATION (ORIF) DISTAL RADIAL FRACTURE;  Surgeon: Betha Loa, MD;  Location: [facility];  Service: Orthopedics;  Laterality: Left;   stab phlebectomy  Right 11/09/2016   stab phlebectomy R leg by Josephina Gip MD   TOOTH EXTRACTION     Patient Active Problem List   Diagnosis Date Noted   Chronic constipation 12/21/2022   Belching 12/14/2022   DISH (diffuse idiopathic skeletal hyperostosis) 05/12/2022   Bilateral primary osteoarthritis of hip 08/13/2021   Statin intolerance 06/24/2021   Spinal stenosis of lumbar region 01/16/2020   Chronic lumbar radiculopathy 12/08/2019   Hyperlipidemia associated with type 2 diabetes mellitus (HCC)  12/08/2019   Vertigo 11/03/2019   Paronychia of left index finger 05/19/2019   Rosacea 10/24/2018   Loss of weight 09/09/2018   Schizoaffective disorder, depressive type (HCC) 04/08/2018   Varicose veins of bilateral lower extremities with other complications 02/03/2016   Erectile dysfunction 04/26/2015   Hypothyroidism    Seizures (HCC)    Persistent atrial fibrillation (HCC)    BMI 34.0-34.9,adult    Acute bilateral low back pain without sciatica    Seborrheic dermatitis 02/10/2007   T2DM (type 2 diabetes mellitus) (HCC) 02/10/2007    PCP: Evelena Leyden, DO  REFERRING PROVIDER: Evelena Leyden, DO   REFERRING DIAG: R42 (ICD-10-CM) - Vertigo  THERAPY DIAG:  Dizziness and giddiness  Unsteadiness on feet  ONSET DATE: 1-1.5 weeks   Rationale for Evaluation and Treatment: Rehabilitation  SUBJECTIVE:   SUBJECTIVE STATEMENT: Dizziness started 1-1.5 weeks ago. Turning over in the bed too quickly brings it on. Concerned about going on an airplane for a trip in July. Denies head trauma, infection/illness, double vision, hearing loss, tinnitus, hx migraines. Reports some blurred vision, occasional tinnitus. Reports that he was given the Epley maneuver but has not tried it. Reports problems with balance for the past 1-2 years and also reports a fall which resulted in concussion around the same  time.   Pt accompanied by: self  PERTINENT HISTORY: A-fib, depression, schizophrenia, seizures, R achilles repair, L distal radius ORIF  PAIN:  Are you having pain? No  PRECAUTIONS: Fall  WEIGHT BEARING RESTRICTIONS: No  FALLS: Has patient fallen in last 6 months? No  LIVING ENVIRONMENT: Lives with: lives alone Lives in: House/apartment Stairs:  ~14 steps to enter; with handrail Has following equipment at home: None  PLOF: Independent; retired but gets some disability   PATIENT GOALS: improve dizziness  OBJECTIVE:   DIAGNOSTIC FINDINGS: none recent  COGNITION: Overall  cognitive status: tangential speech   SENSATION: Pt reports intact B UEs/LEs  *pt has B resting hand tremors  POSTURE:  rounded shoulders and forward head  GAIT: Gait pattern: very slow, guarded gait Assistive device utilized: None Level of assistance: SBA   PATIENT SURVEYS:  Not performed  VESTIBULAR ASSESSMENT:  GENERAL OBSERVATION: looks like some burst blood vessels in the inferior R eye- pt reports seeing eye MD for this. Pt wears bifocals  OCULOMOTOR EXAM:  Ocular Alignment: normal  Ocular ROM: No Limitations  Spontaneous Nystagmus: absent  Gaze-Induced Nystagmus: absent  Smooth Pursuits: intact  Saccades:  delayed and several saccades to complete horizontal and vertical directions  Convergence/Divergence: 4 inch d/t L eye    VESTIBULAR - OCULAR REFLEX:   Slow VOR: slow and hesitant; c/o some dizziness in vertical direction   VOR Cancellation: Corrective Saccades to B directions   Head-Impulse Test: HIT Right: unable to get accurate result d/t excessive guarding  HIT Left: unable to get accurate result d/t excessive guarding  C/o dizziness     POSITIONAL TESTING:  Right Roll Test: negative Left Roll Test: negative Right Dix-Hallpike: negative (cervical ROM limiting testing significantly) Left Dix-Hallpike: negative (cervical ROM limiting testing significantly) Right Sidelying: negative; dizziness upon sitting up Left Sidelying: negative  *pt required min-mod A to for supine>sit   VESTIBULAR TREATMENT:                                                                                                   DATE: 12/28/22   PATIENT EDUCATION: Education details: prognosis, POC, HEP, edu on test results, HEP Person educated: Patient Education method: Explanation, Demonstration, Tactile cues, Verbal cues, and Handouts Education comprehension: verbalized understanding and returned demonstration  HOME EXERCISE PROGRAM: Access Code: [email or web address] URL:  [email or web address] Date: 12/29/2022 Prepared by: [facility] - Outpatient  Rehab - [facility]  Exercises - Seated Gaze Stabilization with Head Rotation  - 1 x daily - 5 x weekly - 2-3 sets - 30 sec hold - Seated Gaze Stabilization with Head Nod  - 1 x daily - 5 x weekly - 2-3 sets - 30 sec hold  GOALS: Goals reviewed with patient? Yes  SHORT TERM GOALS: Target date: 01/12/2023  Patient to be independent with initial HEP. Baseline: HEP initiated Goal status: INITIAL    LONG TERM GOALS: Target date: 01/26/2023  Patient to be independent with advanced HEP. Baseline: Not yet initiated  Goal status: INITIAL  Patient to report 0/10 dizziness with standing vertical and horizontal VOR for 30  seconds. Baseline: Unable Goal status: INITIAL  Patient will report 0/10 dizziness with bed mobility.  Baseline: Symptomatic  Goal status: INITIAL  Patient to score at least 20/24 on DGI in order to decrease risk of falls. Baseline: NT Goal status: INITIAL   ASSESSMENT:  CLINICAL IMPRESSION:  Patient is a 62 y/o M presenting to OPPT with c/o dizziness for the past 1-2 weeks. Denies recent head trauma, infection/illness, double vision, hearing loss, tinnitus, hx migraines. Reports some blurred vision, occasional tinnitus. Reports increased trouble with balance and a concussion 1-2 years ago. Patient today presenting with B resting hand tremors, rounded posture, slow and guarded gait, difficulty with bed mobility, delayed and corrective saccades with saccadic testing, L convergence insufficiency, dizziness with vertical VOR and HIT testing, and corrective saccades with VOR cancellation. Positional testing was negative. Abnormal oculomotor testing could be explained by patient's hx of concussion- will treat patient and refer back to if sx are not improved. Patient was educated on gentle VOR HEP and reported understanding. Would benefit from skilled PT services 1-2x/week for 4  weeks to address aforementioned impairments in order to optimize level of function.    OBJECTIVE IMPAIRMENTS: Abnormal gait, decreased activity tolerance, decreased balance, difficulty walking, dizziness, impaired flexibility, and postural dysfunction.   ACTIVITY LIMITATIONS: carrying, lifting, bending, sitting, standing, squatting, sleeping, stairs, transfers, bed mobility, bathing, toileting, dressing, reach over head, hygiene/grooming, and locomotion level  PARTICIPATION LIMITATIONS: meal prep, cleaning, laundry, driving, shopping, community activity, and church  PERSONAL FACTORS: Age, Past/current experiences, Time since onset of injury/illness/exacerbation, and 3+ comorbidities: A-fib, depression, schizophrenia, seizures, R achilles repair, L distal radius ORIF  are also affecting patient's functional outcome.   REHAB POTENTIAL: Good  CLINICAL DECISION MAKING: Evolving/moderate complexity  EVALUATION COMPLEXITY: Moderate   PLAN:  PT FREQUENCY: 1-2x/week  PT DURATION: 4 weeks  PLANNED INTERVENTIONS: Therapeutic exercises, Therapeutic activity, Neuromuscular re-education, Balance training, Gait training, Patient/Family education, Self Care, Joint mobilization, Stair training, Vestibular training, Canalith repositioning, DME instructions, Aquatic Therapy, Dry Needling, Electrical stimulation, Cryotherapy, Moist heat, Taping, Manual therapy, and Re-evaluation  PLAN FOR NEXT SESSION: review HEP; initiate habituation; DGI   Anette Guarneri, PT, DPT 12/29/22 12:42 PM  [facility] [address], [address] [place], [place] [address] Phone # [phone] Fax # [phone]

## 2022-12-30 ENCOUNTER — Encounter: Payer: Self-pay | Admitting: Family Medicine

## 2022-12-30 ENCOUNTER — Ambulatory Visit (INDEPENDENT_AMBULATORY_CARE_PROVIDER_SITE_OTHER): Payer: Medicare HMO | Admitting: Family Medicine

## 2022-12-30 VITALS — BP 130/86 | HR 70 | Ht 70.0 in | Wt 238.4 lb

## 2022-12-30 DIAGNOSIS — E119 Type 2 diabetes mellitus without complications: Secondary | ICD-10-CM | POA: Diagnosis not present

## 2022-12-30 DIAGNOSIS — K5909 Other constipation: Secondary | ICD-10-CM | POA: Diagnosis not present

## 2022-12-30 DIAGNOSIS — R42 Dizziness and giddiness: Secondary | ICD-10-CM | POA: Diagnosis not present

## 2022-12-30 MED ORDER — MECLIZINE HCL 12.5 MG PO TABS: 12.5000 mg | ORAL_TABLET | Freq: Two times a day (BID) | ORAL | 0 refills | Status: DC | PRN

## 2022-12-30 NOTE — Assessment & Plan Note (Signed)
Still on Ozempic and concerned about muscle loss.  Discussed once again the importance of resistance training as well as getting back in the gym every other day or so.  Patient is aware he needs to increase his protein intake to be able to maintain working out as well.

## 2022-12-30 NOTE — Assessment & Plan Note (Signed)
Stable, working with physical therapy for at least 2 sessions regarding the dizziness.  Needs a new prescription of meclizine - Meclizine as needed sent for plane ride

## 2022-12-30 NOTE — Progress Notes (Signed)
    SUBJECTIVE:   CHIEF COMPLAINT / HPI:   Dizziness - Had evaluation yesterday and it went well - Will be doing 8 PT sessions to address the dizziness - Needs new prescription for meclizine as he accidentally took some of them instead of using them just for his plane ride   Bowel movements - got constipated - took MiraLax and had good success  Diabetes on Ozempic - Is worried about muscle mass loss - When he was going to the gym daily he was feeling very fatigued  PERTINENT  PMH / PSH: Reviewed  OBJECTIVE:   BP 130/86   Pulse 70   Ht 5\' 10"  (1.778 m)   Wt 238 lb 6 oz (108.1 kg)   SpO2 97%   BMI 34.20 kg/m   Gen: well-appearing, NAD CV: RRR, no m/r/g appreciated, no peripheral edema Pulm: CTAB, no wheezes/crackles  ASSESSMENT/PLAN:   Chronic constipation Stable, had improvement with taking MiraLAX dose the other day. - Can continue MiraLAX as needed throughout the week for regular bowel movement schedule  T2DM (type 2 diabetes mellitus) (HCC) Still on Ozempic and concerned about muscle loss.  Discussed once again the importance of resistance training as well as getting back in the gym every other day or so.  Patient is aware he needs to increase his protein intake to be able to maintain working out as well.  Vertigo Stable, working with physical therapy for at least 2 sessions regarding the dizziness.  Needs a new prescription of meclizine - Meclizine as needed sent for plane ride     Evelena Leyden, DO [facility]

## 2022-12-30 NOTE — Patient Instructions (Signed)
Today we discussed the following:  Bowel movements - You can take Miralax every few days to get a normal bowel movement schedule - If you take 2 doses at once just make sure you are taking it with 16oz of water  Dizziness - Keep working with physical therapy - I sent Meclizine for you to use on the plan if you get dizzy  Working out - Every other day 30 minutes with body weight exercises and walking will help - Make sure you are eating enough protein throughout the day when you start back at the gym

## 2022-12-30 NOTE — Therapy (Signed)
OUTPATIENT PHYSICAL THERAPY VESTIBULAR TREATMENT     Patient Name: [name] MRN: [id number] DOB:05/10/1961, 62 y.o., male Today's Date: 12/31/2022  END OF SESSION:  PT End of Session - 12/31/22 1010     Visit Number 2    Number of Visits 9    Date for PT Re-Evaluation 01/26/23    Authorization Type [employer] Medicare/Medicaid    PT Start Time [phone]    PT Stop Time 1013    PT Time Calculation (min) 38 min    Equipment Utilized During Treatment Gait belt    Activity Tolerance Patient tolerated treatment well    Behavior During Therapy WFL for tasks assessed/performed              Past Medical History:  Diagnosis Date   A-fib (HCC)    Acute right-sided low back pain    Arrhythmia    Chronic a-fib (HCC)    Closed fracture of left distal radius    Contact dermatitis 04/16/2017   Depression    Depression    Hypothyroidism    Low back pain    Morbid obesity, BMI unknown (HCC)    Osteoarthritis    oa in bilateral knees   Schizophrenia (HCC)    Seizures (HCC)    Thyroid disease    Varicose veins    Past Surgical History:  Procedure Laterality Date   ACHILLES TENDON REPAIR  approx. 2004   right foot   CATARACT EXTRACTION     bilateral   OPEN REDUCTION INTERNAL FIXATION (ORIF) DISTAL RADIAL FRACTURE Left 07/21/2018   Procedure: OPEN REDUCTION INTERNAL FIXATION (ORIF) DISTAL RADIAL FRACTURE;  Surgeon: Betha Loa, MD;  Location: [facility];  Service: Orthopedics;  Laterality: Left;   stab phlebectomy  Right 11/09/2016   stab phlebectomy R leg by Josephina Gip MD   TOOTH EXTRACTION     Patient Active Problem List   Diagnosis Date Noted   Chronic constipation 12/21/2022   Belching 12/14/2022   DISH (diffuse idiopathic skeletal hyperostosis) 05/12/2022   Bilateral primary osteoarthritis of hip 08/13/2021   Statin intolerance 06/24/2021   Spinal stenosis of lumbar region 01/16/2020   Chronic lumbar radiculopathy 12/08/2019   Hyperlipidemia  associated with type 2 diabetes mellitus (HCC) 12/08/2019   Vertigo 11/03/2019   Paronychia of left index finger 05/19/2019   Rosacea 10/24/2018   Loss of weight 09/09/2018   Schizoaffective disorder, depressive type (HCC) 04/08/2018   Varicose veins of bilateral lower extremities with other complications 02/03/2016   Erectile dysfunction 04/26/2015   Hypothyroidism    Seizures (HCC)    Persistent atrial fibrillation (HCC)    BMI 34.0-34.9,adult    Acute bilateral low back pain without sciatica    Seborrheic dermatitis 02/10/2007   T2DM (type 2 diabetes mellitus) (HCC) 02/10/2007    PCP: Evelena Leyden, DO  REFERRING PROVIDER: Evelena Leyden, DO   REFERRING DIAG: R42 (ICD-10-CM) - Vertigo  THERAPY DIAG:  Dizziness and giddiness  Unsteadiness on feet  ONSET DATE: 1-1.5 weeks   Rationale for Evaluation and Treatment: Rehabilitation  SUBJECTIVE:   SUBJECTIVE STATEMENT: The last week or 10 days I have been feeling unsteady.   Pt accompanied by: self  PERTINENT HISTORY: A-fib, depression, schizophrenia, seizures, R achilles repair, L distal radius ORIF  PAIN:  Are you having pain? No  PRECAUTIONS: Fall  WEIGHT BEARING RESTRICTIONS: No  FALLS: Has patient fallen in last 6 months? No  LIVING ENVIRONMENT: Lives with: lives alone Lives in: House/apartment Stairs:  ~14  steps to enter; with handrail Has following equipment at home: None  PLOF: Independent; retired but gets some disability   PATIENT GOALS: improve dizziness  OBJECTIVE:      TODAY'S TREATMENT: 12/31/22 Activity Comments  DGI 15/24  Sitting horizontal VOR 2x30" Cues to increase amplitude of head movement; edu on intended sx; c/o mild dizziness   Sitting vertical VOR 2x30" Mild dizziness ; increased time required for education and carryover of activity   brandt daroff EO 1x each  Report of "a lot dizzy" upon sitting up from B sides; required mod A and instruction on hand/LE placement   Sitting  R/L forearm prop 5x each C/o some hip pain; mild dizziness       [facility] PT Assessment - 12/31/22 0001       Standardized Balance Assessment   Standardized Balance Assessment Dynamic Gait Index      Dynamic Gait Index   Level Surface Mild Impairment    Change in Gait Speed Mild Impairment    Gait with Horizontal Head Turns Mild Impairment    Gait with Vertical Head Turns Normal    Gait and Pivot Turn Moderate Impairment   c/o dizziness   Step Over Obstacle Moderate Impairment    Step Around Obstacles Mild Impairment    Steps Mild Impairment    Total Score 15               PATIENT EDUCATION: Education details: edu on falls risk according to DGI; detailed review of HEP Person educated: Patient Education method: Explanation, Demonstration, Tactile cues, Verbal cues, and Handouts Education comprehension: verbalized understanding and returned demonstration    Below measures were taken at time of initial evaluation unless otherwise specified:   DIAGNOSTIC FINDINGS: none recent  COGNITION: Overall cognitive status: tangential speech   SENSATION: Pt reports intact B UEs/LEs  *pt has B resting hand tremors  POSTURE:  rounded shoulders and forward head  GAIT: Gait pattern: very slow, guarded gait Assistive device utilized: None Level of assistance: SBA   PATIENT SURVEYS:  Not performed  VESTIBULAR ASSESSMENT:  GENERAL OBSERVATION: looks like some burst blood vessels in the inferior R eye- pt reports seeing eye MD for this. Pt wears bifocals  OCULOMOTOR EXAM:  Ocular Alignment: normal  Ocular ROM: No Limitations  Spontaneous Nystagmus: absent  Gaze-Induced Nystagmus: absent  Smooth Pursuits: intact  Saccades:  delayed and several saccades to complete horizontal and vertical directions  Convergence/Divergence: 4 inch d/t L eye    VESTIBULAR - OCULAR REFLEX:   Slow VOR: slow and hesitant; c/o some dizziness in vertical direction   VOR Cancellation:  Corrective Saccades to B directions   Head-Impulse Test: HIT Right: unable to get accurate result d/t excessive guarding  HIT Left: unable to get accurate result d/t excessive guarding  C/o dizziness     POSITIONAL TESTING:  Right Roll Test: negative Left Roll Test: negative Right Dix-Hallpike: negative (cervical ROM limiting testing significantly) Left Dix-Hallpike: negative (cervical ROM limiting testing significantly) Right Sidelying: negative; dizziness upon sitting up Left Sidelying: negative  *pt required min-mod A to for supine>sit   VESTIBULAR TREATMENT:  DATE: 12/28/22   PATIENT EDUCATION: Education details: prognosis, POC, HEP, edu on test results, HEP Person educated: Patient Education method: Explanation, Demonstration, Tactile cues, Verbal cues, and Handouts Education comprehension: verbalized understanding and returned demonstration  HOME EXERCISE PROGRAM: Access Code: [email or web address] URL: [email or web address] Date: 12/29/2022 Prepared by: [facility] - Outpatient  Rehab - [facility]  Exercises - Seated Gaze Stabilization with Head Rotation  - 1 x daily - 5 x weekly - 2-3 sets - 30 sec hold - Seated Gaze Stabilization with Head Nod  - 1 x daily - 5 x weekly - 2-3 sets - 30 sec hold  GOALS: Goals reviewed with patient? Yes  SHORT TERM GOALS: Target date: 01/12/2023  Patient to be independent with initial HEP. Baseline: HEP initiated Goal status: IN PROGRESS    LONG TERM GOALS: Target date: 01/26/2023  Patient to be independent with advanced HEP. Baseline: Not yet initiated  Goal status: IN PROGRESS  Patient to report 0/10 dizziness with standing vertical and horizontal VOR for 30 seconds. Baseline: Unable Goal status: IN PROGRESS  Patient will report 0/10 dizziness with bed mobility.  Baseline: Symptomatic  Goal status: IN  PROGRESS  Patient to score at least 20/24 on DGI in order to decrease risk of falls. Baseline: 15 12/31/22 Goal status: IN PROGRESS 12/31/22   ASSESSMENT:  CLINICAL IMPRESSION:  Patient arrived to session with report of unsteadiness. Patient with poor recall of exercises- provided handout today and reviewed for understanding.  Patient score on DGI indicates increased risk of falls and dizziness was aggravated with turns. Habituation activities initially brought on severe dizziness and patient continues to require mod A for bed mobility. Better tolerance and independence with modifications. Patient tolerated session well. Noted some dizziness upon standing to leave- resolved with sitting water break. No other complaints upon leaving.   OBJECTIVE IMPAIRMENTS: Abnormal gait, decreased activity tolerance, decreased balance, difficulty walking, dizziness, impaired flexibility, and postural dysfunction.   ACTIVITY LIMITATIONS: carrying, lifting, bending, sitting, standing, squatting, sleeping, stairs, transfers, bed mobility, bathing, toileting, dressing, reach over head, hygiene/grooming, and locomotion level  PARTICIPATION LIMITATIONS: meal prep, cleaning, laundry, driving, shopping, community activity, and church  PERSONAL FACTORS: Age, Past/current experiences, Time since onset of injury/illness/exacerbation, and 3+ comorbidities: A-fib, depression, schizophrenia, seizures, R achilles repair, L distal radius ORIF  are also affecting patient's functional outcome.   REHAB POTENTIAL: Good  CLINICAL DECISION MAKING: Evolving/moderate complexity  EVALUATION COMPLEXITY: Moderate   PLAN:  PT FREQUENCY: 1-2x/week  PT DURATION: 4 weeks  PLANNED INTERVENTIONS: Therapeutic exercises, Therapeutic activity, Neuromuscular re-education, Balance training, Gait training, Patient/Family education, Self Care, Joint mobilization, Stair training, Vestibular training, Canalith repositioning, DME  instructions, Aquatic Therapy, Dry Needling, Electrical stimulation, Cryotherapy, Moist heat, Taping, Manual therapy, and Re-evaluation  PLAN FOR NEXT SESSION: review HEP; start on balance exercises; continue working on habituation   Anette Guarneri, PT, DPT 12/31/22 10:12 AM   Outpatient Rehab at [facility] [address] [place], [place] [address] Phone # [phone] Fax # [phone]

## 2022-12-30 NOTE — Assessment & Plan Note (Signed)
Stable, had improvement with taking MiraLAX dose the other day. - Can continue MiraLAX as needed throughout the week for regular bowel movement schedule

## 2022-12-31 ENCOUNTER — Encounter: Payer: Self-pay | Admitting: Physical Therapy

## 2022-12-31 ENCOUNTER — Ambulatory Visit: Payer: Medicare HMO | Admitting: Physical Therapy

## 2022-12-31 DIAGNOSIS — R42 Dizziness and giddiness: Secondary | ICD-10-CM

## 2022-12-31 DIAGNOSIS — R2681 Unsteadiness on feet: Secondary | ICD-10-CM | POA: Diagnosis not present

## 2023-01-03 ENCOUNTER — Other Ambulatory Visit (HOSPITAL_COMMUNITY): Payer: Self-pay | Admitting: Psychiatry

## 2023-01-03 ENCOUNTER — Other Ambulatory Visit: Payer: Self-pay | Admitting: Family Medicine

## 2023-01-03 DIAGNOSIS — R251 Tremor, unspecified: Secondary | ICD-10-CM

## 2023-01-03 DIAGNOSIS — F339 Major depressive disorder, recurrent, unspecified: Secondary | ICD-10-CM

## 2023-01-04 DIAGNOSIS — H1131 Conjunctival hemorrhage, right eye: Secondary | ICD-10-CM | POA: Diagnosis not present

## 2023-01-08 NOTE — Therapy (Signed)
OUTPATIENT PHYSICAL THERAPY VESTIBULAR TREATMENT     Patient Name: [name] MRN: [id number] DOB:[birth year], 62 y.o., male Today's Date: 01/11/2023  END OF SESSION:  PT End of Session - 01/11/23 1142     Visit Number 3    Number of Visits 9    Date for PT Re-Evaluation 01/26/23    Authorization Type [employer] Medicare/Medicaid    PT Start Time 1104    PT Stop Time 1143    PT Time Calculation (min) 39 min    Equipment Utilized During Treatment --    Activity Tolerance Patient tolerated treatment well    Behavior During Therapy WFL for tasks assessed/performed               Past Medical History:  Diagnosis Date   A-fib (HCC)    Acute right-sided low back pain    Arrhythmia    Chronic a-fib (HCC)    Closed fracture of left distal radius    Contact dermatitis 04/16/2017   Depression    Depression    Hypothyroidism    Low back pain    Morbid obesity, BMI unknown (HCC)    Osteoarthritis    oa in bilateral knees   Schizophrenia (HCC)    Seizures (HCC)    Thyroid disease    Varicose veins    Past Surgical History:  Procedure Laterality Date   ACHILLES TENDON REPAIR  approx. 2004   right foot   CATARACT EXTRACTION     bilateral   OPEN REDUCTION INTERNAL FIXATION (ORIF) DISTAL RADIAL FRACTURE Left 07/21/2018   Procedure: OPEN REDUCTION INTERNAL FIXATION (ORIF) DISTAL RADIAL FRACTURE;  Surgeon: Betha Loa, MD;  Location: [facility];  Service: Orthopedics;  Laterality: Left;   stab phlebectomy  Right 11/09/2016   stab phlebectomy R leg by Josephina Gip MD   TOOTH EXTRACTION     Patient Active Problem List   Diagnosis Date Noted   Chronic constipation 12/21/2022   Belching 12/14/2022   DISH (diffuse idiopathic skeletal hyperostosis) 05/12/2022   Bilateral primary osteoarthritis of hip 08/13/2021   Statin intolerance 06/24/2021   Spinal stenosis of lumbar region 01/16/2020   Chronic lumbar radiculopathy 12/08/2019   Hyperlipidemia associated  with type 2 diabetes mellitus (HCC) 12/08/2019   Vertigo 11/03/2019   Paronychia of left index finger 05/19/2019   Rosacea 10/24/2018   Loss of weight 09/09/2018   Schizoaffective disorder, depressive type (HCC) 04/08/2018   Varicose veins of bilateral lower extremities with other complications 02/03/2016   Erectile dysfunction 04/26/2015   Hypothyroidism    Seizures (HCC)    Persistent atrial fibrillation (HCC)    BMI 34.0-34.9,adult    Acute bilateral low back pain without sciatica    Seborrheic dermatitis 02/10/2007   T2DM (type 2 diabetes mellitus) (HCC) 02/10/2007    PCP: Evelena Leyden, DO  REFERRING PROVIDER: Evelena Leyden, DO   REFERRING DIAG: R42 (ICD-10-CM) - Vertigo  THERAPY DIAG:  Dizziness and giddiness  Unsteadiness on feet  ONSET DATE: 1-1.5 weeks   Rationale for Evaluation and Treatment: Rehabilitation  SUBJECTIVE:   SUBJECTIVE STATEMENT: Last week I felt a little unstable but has not fallen. Had some pain in my hips from HEP- this has since resolved. Reports doing HEP "not much."  Pt accompanied by: self  PERTINENT HISTORY: A-fib, depression, schizophrenia, seizures, R achilles repair, L distal radius ORIF  PAIN:  Are you having pain? No  PRECAUTIONS: Fall  WEIGHT BEARING RESTRICTIONS: No  FALLS: Has patient fallen in last  6 months? No  LIVING ENVIRONMENT: Lives with: lives alone Lives in: House/apartment Stairs:  ~14 steps to enter; with handrail Has following equipment at home: None  PLOF: Independent; retired but gets some disability   PATIENT GOALS: improve dizziness  OBJECTIVE:    TODAY'S TREATMENT: 01/11/23    Orthostatic Testing   Supine Sitting Standing  x1 Minute Standing x 3 Minutes  BP 125/90 mmHg 120/86 *dizzy 108/88 121/88  HR 80 bpm 74 98 89    Activity Comments  Review of HEP:  Sitting horizontal/vertical VOR 30" Hand tremor, thus used target on wall rather than thumb  Sitting R/L forearm prop 5x each with  gaze on target Cueing to scoot forward and keep feet on the floor; c/o "really dizzy" after completing- relieved with grounding techniques and deep breathing. discontinued  R/L roll EO/EC  Performed quickly; no dizziness   romberg EO/EC 30" Mild sway  romberg EO + head turns/nods 30" Mild sway; c/o dizziness with head nods     PATIENT EDUCATION: Education details: edu on orthostatic test results; edu on importance of HEP consistency Person educated: Patient Education method: Explanation, Demonstration, Tactile cues, and Verbal cues Education comprehension: verbalized understanding    Below measures were taken at time of initial evaluation unless otherwise specified:   DIAGNOSTIC FINDINGS: none recent  COGNITION: Overall cognitive status: tangential speech   SENSATION: Pt reports intact B UEs/LEs  *pt has B resting hand tremors  POSTURE:  rounded shoulders and forward head  GAIT: Gait pattern: very slow, guarded gait Assistive device utilized: None Level of assistance: SBA   PATIENT SURVEYS:  Not performed  VESTIBULAR ASSESSMENT:  GENERAL OBSERVATION: looks like some burst blood vessels in the inferior R eye- pt reports seeing eye MD for this. Pt wears bifocals  OCULOMOTOR EXAM:  Ocular Alignment: normal  Ocular ROM: No Limitations  Spontaneous Nystagmus: absent  Gaze-Induced Nystagmus: absent  Smooth Pursuits: intact  Saccades:  delayed and several saccades to complete horizontal and vertical directions  Convergence/Divergence: 4 inch d/t L eye    VESTIBULAR - OCULAR REFLEX:   Slow VOR: slow and hesitant; c/o some dizziness in vertical direction   VOR Cancellation: Corrective Saccades to B directions   Head-Impulse Test: HIT Right: unable to get accurate result d/t excessive guarding  HIT Left: unable to get accurate result d/t excessive guarding  C/o dizziness     POSITIONAL TESTING:  Right Roll Test: negative Left Roll Test: negative Right  Dix-Hallpike: negative (cervical ROM limiting testing significantly) Left Dix-Hallpike: negative (cervical ROM limiting testing significantly) Right Sidelying: negative; dizziness upon sitting up Left Sidelying: negative  *pt required min-mod A to for supine>sit   VESTIBULAR TREATMENT:                                                                                                   DATE: 12/28/22   PATIENT EDUCATION: Education details: prognosis, POC, HEP, edu on test results, HEP Person educated: Patient Education method: Explanation, Demonstration, Tactile cues, Verbal cues, and Handouts Education comprehension: verbalized understanding and returned demonstration  HOME EXERCISE PROGRAM: Access  Code: [email or web address] URL: [email or web address] Date: 12/29/2022 Prepared by: [facility] - Outpatient  Rehab - [facility]  Exercises - Seated Gaze Stabilization with Head Rotation  - 1 x daily - 5 x weekly - 2-3 sets - 30 sec hold - Seated Gaze Stabilization with Head Nod  - 1 x daily - 5 x weekly - 2-3 sets - 30 sec hold  GOALS: Goals reviewed with patient? Yes  SHORT TERM GOALS: Target date: 01/12/2023  Patient to be independent with initial HEP. Baseline: HEP initiated Goal status: IN PROGRESS    LONG TERM GOALS: Target date: 01/26/2023  Patient to be independent with advanced HEP. Baseline: Not yet initiated  Goal status: IN PROGRESS  Patient to report 0/10 dizziness with standing vertical and horizontal VOR for 30 seconds. Baseline: Unable Goal status: IN PROGRESS  Patient will report 0/10 dizziness with bed mobility.  Baseline: Symptomatic  Goal status: IN PROGRESS  Patient to score at least 20/24 on DGI in order to decrease risk of falls. Baseline: 15 12/31/22 Goal status: IN PROGRESS 12/31/22   ASSESSMENT:  CLINICAL IMPRESSION:  Patient arrived to session with report of continued unsteadiness. Orthostatics were taken and were grossly negative. Upon  questioning, patient with poor recall of HEP and admits to limited compliance. Reviewed HEP and ensured understanding. Discontinued progression of gaze stabilization as this brought on c/o intense dizziness but was resolved with cues for grounding. Also with c/o dizziness with head nods today. Patient tolerated session well and without c/o upon leaving.   OBJECTIVE IMPAIRMENTS: Abnormal gait, decreased activity tolerance, decreased balance, difficulty walking, dizziness, impaired flexibility, and postural dysfunction.   ACTIVITY LIMITATIONS: carrying, lifting, bending, sitting, standing, squatting, sleeping, stairs, transfers, bed mobility, bathing, toileting, dressing, reach over head, hygiene/grooming, and locomotion level  PARTICIPATION LIMITATIONS: meal prep, cleaning, laundry, driving, shopping, community activity, and church  PERSONAL FACTORS: Age, Past/current experiences, Time since onset of injury/illness/exacerbation, and 3+ comorbidities: A-fib, depression, schizophrenia, seizures, R achilles repair, L distal radius ORIF  are also affecting patient's functional outcome.   REHAB POTENTIAL: Good  CLINICAL DECISION MAKING: Evolving/moderate complexity  EVALUATION COMPLEXITY: Moderate   PLAN:  PT FREQUENCY: 1-2x/week  PT DURATION: 4 weeks  PLANNED INTERVENTIONS: Therapeutic exercises, Therapeutic activity, Neuromuscular re-education, Balance training, Gait training, Patient/Family education, Self Care, Joint mobilization, Stair training, Vestibular training, Canalith repositioning, DME instructions, Aquatic Therapy, Dry Needling, Electrical stimulation, Cryotherapy, Moist heat, Taping, Manual therapy, and Re-evaluation  PLAN FOR NEXT SESSION:  start on balance exercises; continue working on habituation   Anette Guarneri, PT, DPT 01/11/23 11:46 AM  [place] [facility] at [facility] [address], [address] [place], [place] [address] Phone # [phone] Fax # [phone]

## 2023-01-11 ENCOUNTER — Encounter: Payer: Self-pay | Admitting: Physical Therapy

## 2023-01-11 ENCOUNTER — Ambulatory Visit: Payer: Medicare HMO | Attending: Family Medicine | Admitting: Physical Therapy

## 2023-01-11 ENCOUNTER — Other Ambulatory Visit (HOSPITAL_COMMUNITY): Payer: Self-pay | Admitting: *Deleted

## 2023-01-11 DIAGNOSIS — R2681 Unsteadiness on feet: Secondary | ICD-10-CM | POA: Insufficient documentation

## 2023-01-11 DIAGNOSIS — R251 Tremor, unspecified: Secondary | ICD-10-CM

## 2023-01-11 DIAGNOSIS — R42 Dizziness and giddiness: Secondary | ICD-10-CM | POA: Insufficient documentation

## 2023-01-11 DIAGNOSIS — F339 Major depressive disorder, recurrent, unspecified: Secondary | ICD-10-CM

## 2023-01-11 MED ORDER — BENZTROPINE MESYLATE 1 MG PO TABS
1.0000 mg | ORAL_TABLET | Freq: Every day | ORAL | 0 refills | Status: DC
Start: 2023-01-11 — End: 2023-02-05

## 2023-01-12 NOTE — Therapy (Signed)
OUTPATIENT PHYSICAL THERAPY VESTIBULAR TREATMENT     Patient Name: [name] MRN: [id number] DOB:[birth year], 62 y.o., male Today's Date: 01/12/2023  END OF SESSION:      Past Medical History:  Diagnosis Date   A-fib [facility])    Acute right-sided low back pain    Arrhythmia    Chronic a-fib (HCC)    Closed fracture of left distal radius    Contact dermatitis 04/16/2017   Depression    Depression    Hypothyroidism    Low back pain    Morbid obesity, BMI unknown (HCC)    Osteoarthritis    oa in bilateral knees   Schizophrenia (HCC)    Seizures (HCC)    Thyroid disease    Varicose veins    Past Surgical History:  Procedure Laterality Date   ACHILLES TENDON REPAIR  approx. 2004   right foot   CATARACT EXTRACTION     bilateral   OPEN REDUCTION INTERNAL FIXATION (ORIF) DISTAL RADIAL FRACTURE Left 07/21/2018   Procedure: OPEN REDUCTION INTERNAL FIXATION (ORIF) DISTAL RADIAL FRACTURE;  Surgeon: Betha Loa, MD;  Location: [facility];  Service: Orthopedics;  Laterality: Left;   stab phlebectomy  Right 11/09/2016   stab phlebectomy R leg by Josephina Gip MD   TOOTH EXTRACTION     Patient Active Problem List   Diagnosis Date Noted   Chronic constipation 12/21/2022   Belching 12/14/2022   DISH (diffuse idiopathic skeletal hyperostosis) 05/12/2022   Bilateral primary osteoarthritis of hip 08/13/2021   Statin intolerance 06/24/2021   Spinal stenosis of lumbar region 01/16/2020   Chronic lumbar radiculopathy 12/08/2019   Hyperlipidemia associated with type 2 diabetes mellitus (HCC) 12/08/2019   Vertigo 11/03/2019   Paronychia of left index finger 05/19/2019   Rosacea 10/24/2018   Loss of weight 09/09/2018   Schizoaffective disorder, depressive type (HCC) 04/08/2018   Varicose veins of bilateral lower extremities with other complications 02/03/2016   Erectile dysfunction 04/26/2015   Hypothyroidism    Seizures (HCC)    Persistent atrial fibrillation  (HCC)    BMI 34.0-34.9,adult    Acute bilateral low back pain without sciatica    Seborrheic dermatitis 02/10/2007   T2DM (type 2 diabetes mellitus) (HCC) 02/10/2007    PCP: Evelena Leyden, DO  REFERRING PROVIDER: Evelena Leyden, DO   REFERRING DIAG: R42 (ICD-10-CM) - Vertigo  THERAPY DIAG:  No diagnosis found.  ONSET DATE: 1-1.5 weeks   Rationale for Evaluation and Treatment: Rehabilitation  SUBJECTIVE:   SUBJECTIVE STATEMENT: Last week I felt a little unstable but has not fallen. Had some pain in my hips from HEP- this has since resolved. Reports doing HEP "not much."  Pt accompanied by: self  PERTINENT HISTORY: A-fib, depression, schizophrenia, seizures, R achilles repair, L distal radius ORIF  PAIN:  Are you having pain? No  PRECAUTIONS: Fall  WEIGHT BEARING RESTRICTIONS: No  FALLS: Has patient fallen in last 6 months? No  LIVING ENVIRONMENT: Lives with: lives alone Lives in: House/apartment Stairs:  ~14 steps to enter; with handrail Has following equipment at home: None  PLOF: Independent; retired but gets some disability   PATIENT GOALS: improve dizziness  OBJECTIVE:     TODAY'S TREATMENT: 01/13/23 Activity Comments                         Below measures were taken at time of initial evaluation unless otherwise specified:   DIAGNOSTIC FINDINGS: none recent  COGNITION: Overall cognitive status: tangential  speech   SENSATION: Pt reports intact B UEs/LEs  *pt has B resting hand tremors  POSTURE:  rounded shoulders and forward head  GAIT: Gait pattern: very slow, guarded gait Assistive device utilized: None Level of assistance: SBA   PATIENT SURVEYS:  Not performed  VESTIBULAR ASSESSMENT:  GENERAL OBSERVATION: looks like some burst blood vessels in the inferior R eye- pt reports seeing eye MD for this. Pt wears bifocals  OCULOMOTOR EXAM:  Ocular Alignment: normal  Ocular ROM: No Limitations  Spontaneous Nystagmus:  absent  Gaze-Induced Nystagmus: absent  Smooth Pursuits: intact  Saccades:  delayed and several saccades to complete horizontal and vertical directions  Convergence/Divergence: 4 inch d/t L eye    VESTIBULAR - OCULAR REFLEX:   Slow VOR: slow and hesitant; c/o some dizziness in vertical direction   VOR Cancellation: Corrective Saccades to B directions   Head-Impulse Test: HIT Right: unable to get accurate result d/t excessive guarding  HIT Left: unable to get accurate result d/t excessive guarding  C/o dizziness     POSITIONAL TESTING:  Right Roll Test: negative Left Roll Test: negative Right Dix-Hallpike: negative (cervical ROM limiting testing significantly) Left Dix-Hallpike: negative (cervical ROM limiting testing significantly) Right Sidelying: negative; dizziness upon sitting up Left Sidelying: negative  *pt required min-mod A to for supine>sit   VESTIBULAR TREATMENT:                                                                                                   DATE: 12/28/22   PATIENT EDUCATION: Education details: prognosis, POC, HEP, edu on test results, HEP Person educated: Patient Education method: Explanation, Demonstration, Tactile cues, Verbal cues, and Handouts Education comprehension: verbalized understanding and returned demonstration  HOME EXERCISE PROGRAM: Access Code: [email or web address] URL: [email or web address] Date: 12/29/2022 Prepared by: [facility] - Outpatient  Rehab - [facility]  Exercises - Seated Gaze Stabilization with Head Rotation  - 1 x daily - 5 x weekly - 2-3 sets - 30 sec hold - Seated Gaze Stabilization with Head Nod  - 1 x daily - 5 x weekly - 2-3 sets - 30 sec hold  GOALS: Goals reviewed with patient? Yes  SHORT TERM GOALS: Target date: 01/12/2023  Patient to be independent with initial HEP. Baseline: HEP initiated Goal status: IN PROGRESS    LONG TERM GOALS: Target date: 01/26/2023  Patient to be independent  with advanced HEP. Baseline: Not yet initiated  Goal status: IN PROGRESS  Patient to report 0/10 dizziness with standing vertical and horizontal VOR for 30 seconds. Baseline: Unable Goal status: IN PROGRESS  Patient will report 0/10 dizziness with bed mobility.  Baseline: Symptomatic  Goal status: IN PROGRESS  Patient to score at least 20/24 on DGI in order to decrease risk of falls. Baseline: 15 12/31/22 Goal status: IN PROGRESS 12/31/22   ASSESSMENT:  CLINICAL IMPRESSION:  Patient arrived to session with report of continued unsteadiness. Orthostatics were taken and were grossly negative. Upon questioning, patient with poor recall of HEP and admits to limited compliance. Reviewed HEP and ensured understanding. Discontinued progression of gaze stabilization as this brought  on c/o intense dizziness but was resolved with cues for grounding. Also with c/o dizziness with head nods today. Patient tolerated session well and without c/o upon leaving.   OBJECTIVE IMPAIRMENTS: Abnormal gait, decreased activity tolerance, decreased balance, difficulty walking, dizziness, impaired flexibility, and postural dysfunction.   ACTIVITY LIMITATIONS: carrying, lifting, bending, sitting, standing, squatting, sleeping, stairs, transfers, bed mobility, bathing, toileting, dressing, reach over head, hygiene/grooming, and locomotion level  PARTICIPATION LIMITATIONS: meal prep, cleaning, laundry, driving, shopping, community activity, and church  PERSONAL FACTORS: Age, Past/current experiences, Time since onset of injury/illness/exacerbation, and 3+ comorbidities: A-fib, depression, schizophrenia, seizures, R achilles repair, L distal radius ORIF  are also affecting patient's functional outcome.   REHAB POTENTIAL: Good  CLINICAL DECISION MAKING: Evolving/moderate complexity  EVALUATION COMPLEXITY: Moderate   PLAN:  PT FREQUENCY: 1-2x/week  PT DURATION: 4 weeks  PLANNED INTERVENTIONS: Therapeutic  exercises, Therapeutic activity, Neuromuscular re-education, Balance training, Gait training, Patient/Family education, Self Care, Joint mobilization, Stair training, Vestibular training, Canalith repositioning, DME instructions, Aquatic Therapy, Dry Needling, Electrical stimulation, Cryotherapy, Moist heat, Taping, Manual therapy, and Re-evaluation  PLAN FOR NEXT SESSION:  start on balance exercises; continue working on habituation   Anette Guarneri, PT, DPT 01/12/23 10:12 AM  [facility] at [facility] [address] [place], [place] [address] Phone # [phone] Fax # [phone]

## 2023-01-13 ENCOUNTER — Encounter: Payer: Self-pay | Admitting: Physical Therapy

## 2023-01-13 ENCOUNTER — Ambulatory Visit: Payer: Medicare HMO | Admitting: Physical Therapy

## 2023-01-13 DIAGNOSIS — R2681 Unsteadiness on feet: Secondary | ICD-10-CM

## 2023-01-13 DIAGNOSIS — R42 Dizziness and giddiness: Secondary | ICD-10-CM | POA: Diagnosis not present

## 2023-01-14 ENCOUNTER — Ambulatory Visit: Payer: Medicare HMO

## 2023-01-15 NOTE — Therapy (Signed)
OUTPATIENT PHYSICAL THERAPY VESTIBULAR TREATMENT     Patient Name: [name] MRN: [id number] DOB:1961/01/23, 62 y.o., male Today's Date: 01/18/2023  END OF SESSION:  PT End of Session - 01/18/23 1145     Visit Number 5    Number of Visits 9    Date for PT Re-Evaluation 01/26/23    Authorization Type [employer] Medicare/Medicaid    PT Start Time 1117   pt late   PT Stop Time 1144    PT Time Calculation (min) 27 min    Equipment Utilized During Treatment Gait belt    Activity Tolerance Patient tolerated treatment well    Behavior During Therapy WFL for tasks assessed/performed                 Past Medical History:  Diagnosis Date   A-fib (HCC)    Acute right-sided low back pain    Arrhythmia    Chronic a-fib (HCC)    Closed fracture of left distal radius    Contact dermatitis 04/16/2017   Depression    Depression    Hypothyroidism    Low back pain    Morbid obesity, BMI unknown (HCC)    Osteoarthritis    oa in bilateral knees   Schizophrenia (HCC)    Seizures (HCC)    Thyroid disease    Varicose veins    Past Surgical History:  Procedure Laterality Date   ACHILLES TENDON REPAIR  approx. 2004   right foot   CATARACT EXTRACTION     bilateral   OPEN REDUCTION INTERNAL FIXATION (ORIF) DISTAL RADIAL FRACTURE Left 07/21/2018   Procedure: OPEN REDUCTION INTERNAL FIXATION (ORIF) DISTAL RADIAL FRACTURE;  Surgeon: Betha Loa, MD;  Location: [facility];  Service: Orthopedics;  Laterality: Left;   stab phlebectomy  Right 11/09/2016   stab phlebectomy R leg by Josephina Gip MD   TOOTH EXTRACTION     Patient Active Problem List   Diagnosis Date Noted   Chronic constipation 12/21/2022   Belching 12/14/2022   DISH (diffuse idiopathic skeletal hyperostosis) 05/12/2022   Bilateral primary osteoarthritis of hip 08/13/2021   Statin intolerance 06/24/2021   Spinal stenosis of lumbar region 01/16/2020   Chronic lumbar radiculopathy 12/08/2019    Hyperlipidemia associated with type 2 diabetes mellitus (HCC) 12/08/2019   Vertigo 11/03/2019   Paronychia of left index finger 05/19/2019   Rosacea 10/24/2018   Loss of weight 09/09/2018   Schizoaffective disorder, depressive type (HCC) 04/08/2018   Varicose veins of bilateral lower extremities with other complications 02/03/2016   Erectile dysfunction 04/26/2015   Hypothyroidism    Seizures (HCC)    Persistent atrial fibrillation (HCC)    BMI 34.0-34.9,adult    Acute bilateral low back pain without sciatica    Seborrheic dermatitis 02/10/2007   T2DM (type 2 diabetes mellitus) (HCC) 02/10/2007    PCP: Evelena Leyden, DO  REFERRING PROVIDER: Evelena Leyden, DO   REFERRING DIAG: R42 (ICD-10-CM) - Vertigo  THERAPY DIAG:  Dizziness and giddiness  Unsteadiness on feet  ONSET DATE: 1-1.5 weeks   Rationale for Evaluation and Treatment: Rehabilitation  SUBJECTIVE:   SUBJECTIVE STATEMENT: Requesting not to do movements with his eyes d/t feeling like he gets a vertigo spell after PT appointments. Reports that a few days ago he was walking like he was drunk- surprised that he did not fall. Reports 7/10 dizziness currently from getting out of the car.   Pt accompanied by: self  PERTINENT HISTORY: A-fib, depression, schizophrenia, seizures, R achilles repair,  L distal radius ORIF  PAIN:  Are you having pain? No   PRECAUTIONS: Fall  WEIGHT BEARING RESTRICTIONS: No  FALLS: Has patient fallen in last 6 months? No  LIVING ENVIRONMENT: Lives with: lives alone Lives in: House/apartment Stairs:  ~14 steps to enter; with handrail Has following equipment at home: None  PLOF: Independent; retired but gets some disability   PATIENT GOALS: improve dizziness  OBJECTIVE:     TODAY'S TREATMENT: 01/18/23 Activity Comments  Gait training with single walking pole in R hand 15ft Heavy verbal and manual cueing for sequencing; pt taking small slow steps and had to stop 2x d/t  dizziness (after turn and when looking down)  At counter top: backwards walking 4x alt toe tap on step 2x20 Slow, narrow BOS; able to perform toe taps without UE support      HOME EXERCISE PROGRAM Last updated: 01/13/23 Access Code: [email or web address] URL: [email or web address] Date: 01/13/2023 Prepared by: [facility] - Outpatient  Rehab - [facility]  Exercises - Seated Gaze Stabilization with Head Rotation  - 1 x daily - 5 x weekly - 2-3 sets - 30 sec hold - Seated Gaze Stabilization with Head Nod  - 1 x daily - 5 x weekly - 2-3 sets - 30 sec hold - Sit to Stand Without Arm Support  - 1 x daily - 5 x weekly - 2 sets - 5 reps - Feet Together Balance at [employer] Eyes Closed  - 1 x daily - 5 x weekly - 2 sets - 30 sec hold    Below measures were taken at time of initial evaluation unless otherwise specified:   DIAGNOSTIC FINDINGS: none recent  COGNITION: Overall cognitive status: tangential speech   SENSATION: Pt reports intact B UEs/LEs  *pt has B resting hand tremors  POSTURE:  rounded shoulders and forward head  GAIT: Gait pattern: very slow, guarded gait Assistive device utilized: None Level of assistance: SBA   PATIENT SURVEYS:  Not performed  VESTIBULAR ASSESSMENT:  GENERAL OBSERVATION: looks like some burst blood vessels in the inferior R eye- pt reports seeing eye MD for this. Pt wears bifocals  OCULOMOTOR EXAM:  Ocular Alignment: normal  Ocular ROM: No Limitations  Spontaneous Nystagmus: absent  Gaze-Induced Nystagmus: absent  Smooth Pursuits: intact  Saccades:  delayed and several saccades to complete horizontal and vertical directions  Convergence/Divergence: 4 inch d/t L eye    VESTIBULAR - OCULAR REFLEX:   Slow VOR: slow and hesitant; c/o some dizziness in vertical direction   VOR Cancellation: Corrective Saccades to B directions   Head-Impulse Test: HIT Right: unable to get accurate result d/t excessive guarding  HIT Left:  unable to get accurate result d/t excessive guarding  C/o dizziness     POSITIONAL TESTING:  Right Roll Test: negative Left Roll Test: negative Right Dix-Hallpike: negative (cervical ROM limiting testing significantly) Left Dix-Hallpike: negative (cervical ROM limiting testing significantly) Right Sidelying: negative; dizziness upon sitting up Left Sidelying: negative  *pt required min-mod A to for supine>sit   VESTIBULAR TREATMENT:  DATE: 12/28/22   PATIENT EDUCATION: Education details: prognosis, POC, HEP, edu on test results, HEP Person educated: Patient Education method: Explanation, Demonstration, Tactile cues, Verbal cues, and Handouts Education comprehension: verbalized understanding and returned demonstration  HOME EXERCISE PROGRAM: Access Code: [email or web address] URL: [email or web address] Date: 12/29/2022 Prepared by: [facility] - Outpatient  Rehab - [facility]  Exercises - Seated Gaze Stabilization with Head Rotation  - 1 x daily - 5 x weekly - 2-3 sets - 30 sec hold - Seated Gaze Stabilization with Head Nod  - 1 x daily - 5 x weekly - 2-3 sets - 30 sec hold  GOALS: Goals reviewed with patient? Yes  SHORT TERM GOALS: Target date: 01/12/2023  Patient to be independent with initial HEP. Baseline: HEP initiated Goal status: MET 01/13/23    LONG TERM GOALS: Target date: 01/26/2023  Patient to be independent with advanced HEP. Baseline: Not yet initiated  Goal status: IN PROGRESS  Patient to report 0/10 dizziness with standing vertical and horizontal VOR for 30 seconds. Baseline: Unable Goal status: IN PROGRESS  Patient will report 0/10 dizziness with bed mobility.  Baseline: Symptomatic  Goal status: IN PROGRESS  Patient to score at least 20/24 on DGI in order to decrease risk of falls. Baseline: 15 12/31/22 Goal status: IN PROGRESS  12/31/22   ASSESSMENT:  CLINICAL IMPRESSION:  Patient arrived to session with request to avoid activities that bring on dizziness despite education on clinical reasoning behind habituation in previous session. Patient reported 7/10 dizziness at start of session. Focused on gait training with walking pole d/t patient reporting imbalance. Reported increased dizziness when turning and looking down at the floor, requesting short rest breaks. However, patient also reports spontaneous onset of dizziness. Reported 3/10 dizziness upon leaving.   OBJECTIVE IMPAIRMENTS: Abnormal gait, decreased activity tolerance, decreased balance, difficulty walking, dizziness, impaired flexibility, and postural dysfunction.   ACTIVITY LIMITATIONS: carrying, lifting, bending, sitting, standing, squatting, sleeping, stairs, transfers, bed mobility, bathing, toileting, dressing, reach over head, hygiene/grooming, and locomotion level  PARTICIPATION LIMITATIONS: meal prep, cleaning, laundry, driving, shopping, community activity, and church  PERSONAL FACTORS: Age, Past/current experiences, Time since onset of injury/illness/exacerbation, and 3+ comorbidities: A-fib, depression, schizophrenia, seizures, R achilles repair, L distal radius ORIF  are also affecting patient's functional outcome.   REHAB POTENTIAL: Good  CLINICAL DECISION MAKING: Evolving/moderate complexity  EVALUATION COMPLEXITY: Moderate   PLAN:  PT FREQUENCY: 1-2x/week  PT DURATION: 4 weeks  PLANNED INTERVENTIONS: Therapeutic exercises, Therapeutic activity, Neuromuscular re-education, Balance training, Gait training, Patient/Family education, Self Care, Joint mobilization, Stair training, Vestibular training, Canalith repositioning, DME instructions, Aquatic Therapy, Dry Needling, Electrical stimulation, Cryotherapy, Moist heat, Taping, Manual therapy, and Re-evaluation  PLAN FOR NEXT SESSION:  continue with balance and habituation    Anette Guarneri, PT, DPT 01/18/23 11:45 AM  [facility] at [facility] [address] [place], [place] [address] Phone # [phone] Fax # [phone]

## 2023-01-18 ENCOUNTER — Encounter: Payer: Self-pay | Admitting: Physical Therapy

## 2023-01-18 ENCOUNTER — Ambulatory Visit: Payer: Medicare HMO | Admitting: Physical Therapy

## 2023-01-18 DIAGNOSIS — R42 Dizziness and giddiness: Secondary | ICD-10-CM | POA: Diagnosis not present

## 2023-01-18 DIAGNOSIS — R2681 Unsteadiness on feet: Secondary | ICD-10-CM

## 2023-01-19 DIAGNOSIS — Z85828 Personal history of other malignant neoplasm of skin: Secondary | ICD-10-CM | POA: Diagnosis not present

## 2023-01-19 DIAGNOSIS — Z08 Encounter for follow-up examination after completed treatment for malignant neoplasm: Secondary | ICD-10-CM | POA: Diagnosis not present

## 2023-01-19 DIAGNOSIS — D2361 Other benign neoplasm of skin of right upper limb, including shoulder: Secondary | ICD-10-CM | POA: Diagnosis not present

## 2023-01-19 DIAGNOSIS — L905 Scar conditions and fibrosis of skin: Secondary | ICD-10-CM | POA: Diagnosis not present

## 2023-01-19 NOTE — Therapy (Signed)
OUTPATIENT PHYSICAL THERAPY VESTIBULAR TREATMENT     Patient Name: [name] MRN: [id number] DOB:Nov 23, 1960, 62 y.o., male Today's Date: 01/20/2023  END OF SESSION:  PT End of Session - 01/20/23 1119     Visit Number 6    Number of Visits 9    Date for PT Re-Evaluation 01/26/23    Authorization Type [employer] Medicare/Medicaid    PT Start Time 1035    PT Stop Time 1117    PT Time Calculation (min) 42 min    Equipment Utilized During Treatment Gait belt    Activity Tolerance Patient tolerated treatment well    Behavior During Therapy WFL for tasks assessed/performed                  Past Medical History:  Diagnosis Date   A-fib (HCC)    Acute right-sided low back pain    Arrhythmia    Chronic a-fib (HCC)    Closed fracture of left distal radius    Contact dermatitis 04/16/2017   Depression    Depression    Hypothyroidism    Low back pain    Morbid obesity, BMI unknown (HCC)    Osteoarthritis    oa in bilateral knees   Schizophrenia (HCC)    Seizures (HCC)    Thyroid disease    Varicose veins    Past Surgical History:  Procedure Laterality Date   ACHILLES TENDON REPAIR  approx. 2004   right foot   CATARACT EXTRACTION     bilateral   OPEN REDUCTION INTERNAL FIXATION (ORIF) DISTAL RADIAL FRACTURE Left 07/21/2018   Procedure: OPEN REDUCTION INTERNAL FIXATION (ORIF) DISTAL RADIAL FRACTURE;  Surgeon: Betha Loa, MD;  Location: [facility];  Service: Orthopedics;  Laterality: Left;   stab phlebectomy  Right 11/09/2016   stab phlebectomy R leg by Josephina Gip MD   TOOTH EXTRACTION     Patient Active Problem List   Diagnosis Date Noted   Chronic constipation 12/21/2022   Belching 12/14/2022   DISH (diffuse idiopathic skeletal hyperostosis) 05/12/2022   Bilateral primary osteoarthritis of hip 08/13/2021   Statin intolerance 06/24/2021   Spinal stenosis of lumbar region 01/16/2020   Chronic lumbar radiculopathy 12/08/2019    Hyperlipidemia associated with type 2 diabetes mellitus (HCC) 12/08/2019   Vertigo 11/03/2019   Paronychia of left index finger 05/19/2019   Rosacea 10/24/2018   Loss of weight 09/09/2018   Schizoaffective disorder, depressive type (HCC) 04/08/2018   Varicose veins of bilateral lower extremities with other complications 02/03/2016   Erectile dysfunction 04/26/2015   Hypothyroidism    Seizures (HCC)    Persistent atrial fibrillation (HCC)    BMI 34.0-34.9,adult    Acute bilateral low back pain without sciatica    Seborrheic dermatitis 02/10/2007   T2DM (type 2 diabetes mellitus) (HCC) 02/10/2007    PCP: Evelena Leyden, DO  REFERRING PROVIDER: Evelena Leyden, DO   REFERRING DIAG: R42 (ICD-10-CM) - Vertigo  THERAPY DIAG:  Dizziness and giddiness  Unsteadiness on feet  ONSET DATE: 1-1.5 weeks   Rationale for Evaluation and Treatment: Rehabilitation  SUBJECTIVE:   SUBJECTIVE STATEMENT: "Ever since last session I think I did something to my back." Recalls hx of LBP which he received PT for. Denies radiation, N/T, B&B changes.   Pt accompanied by: self  PERTINENT HISTORY: A-fib, depression, schizophrenia, seizures, R achilles repair, L distal radius ORIF  PAIN:  Are you having pain? Yes: NPRS scale: 8/10 Pain location: L LB Pain description: sharp Aggravating factors:  standing Relieving factors: has not tried    PRECAUTIONS: Fall  WEIGHT BEARING RESTRICTIONS: No  FALLS: Has patient fallen in last 6 months? No  LIVING ENVIRONMENT: Lives with: lives alone Lives in: House/apartment Stairs:  ~14 steps to enter; with handrail Has following equipment at home: None  PLOF: Independent; retired but gets some disability   PATIENT GOALS: improve dizziness  OBJECTIVE:    TODAY'S TREATMENT: 01/20/23 Activity Comments  Sitting pelvic tilts 15-20 reps Heavy cueing for form; compensation with hip flexion. Advised reduced ROM for improved tolerance   Prayer stretch with  pball 10x Cueing for foot positioning, movement to tolerance  Palpation of LB No TTP or significant trigger pt  1/4 and 1/2 turns to targets In II bars, able to perform with improved step height and stability; c/o mild wooziness with 1/2 turns  backwards walking In II bars,narrow BOS and small steps; trialed more "monster walk" style steps with difficulty replicating   alt toe tap on cone Without UE support improved success after cueing and practice in standing wt to standing LE  standing head turns to targets 30" Able to tolerate without dizziness at slow speed and with 1 UE support  standing head nods to targets 30" Able to tolerate with mild dizziness at slow speed and with 1 UE support  romberg EC 30"  Mild sway             PATIENT EDUCATION: Education details: edu on ice or heat 10-15 min on LB with care to avoid burning skin  Person educated: Patient Education method: Explanation Education comprehension: verbalized understanding and returned demonstration   HOME EXERCISE PROGRAM Last updated: 01/13/23 Access Code: [email or web address] URL: [email or web address] Date: 01/13/2023 Prepared by: [facility] - Outpatient  Rehab - [facility]  Exercises - Seated Gaze Stabilization with Head Rotation  - 1 x daily - 5 x weekly - 2-3 sets - 30 sec hold - Seated Gaze Stabilization with Head Nod  - 1 x daily - 5 x weekly - 2-3 sets - 30 sec hold - Sit to Stand Without Arm Support  - 1 x daily - 5 x weekly - 2 sets - 5 reps - Feet Together Balance at [employer] Eyes Closed  - 1 x daily - 5 x weekly - 2 sets - 30 sec hold    Below measures were taken at time of initial evaluation unless otherwise specified:   DIAGNOSTIC FINDINGS: none recent  COGNITION: Overall cognitive status: tangential speech   SENSATION: Pt reports intact B UEs/LEs  *pt has B resting hand tremors  POSTURE:  rounded shoulders and forward head  GAIT: Gait pattern: very slow, guarded gait Assistive  device utilized: None Level of assistance: SBA   PATIENT SURVEYS:  Not performed  VESTIBULAR ASSESSMENT:  GENERAL OBSERVATION: looks like some burst blood vessels in the inferior R eye- pt reports seeing eye MD for this. Pt wears bifocals  OCULOMOTOR EXAM:  Ocular Alignment: normal  Ocular ROM: No Limitations  Spontaneous Nystagmus: absent  Gaze-Induced Nystagmus: absent  Smooth Pursuits: intact  Saccades:  delayed and several saccades to complete horizontal and vertical directions  Convergence/Divergence: 4 inch d/t L eye    VESTIBULAR - OCULAR REFLEX:   Slow VOR: slow and hesitant; c/o some dizziness in vertical direction   VOR Cancellation: Corrective Saccades to B directions   Head-Impulse Test: HIT Right: unable to get accurate result d/t excessive guarding  HIT Left: unable to get accurate result d/t excessive guarding  C/o dizziness     POSITIONAL TESTING:  Right Roll Test: negative Left Roll Test: negative Right Dix-Hallpike: negative (cervical ROM limiting testing significantly) Left Dix-Hallpike: negative (cervical ROM limiting testing significantly) Right Sidelying: negative; dizziness upon sitting up Left Sidelying: negative  *pt required min-mod A to for supine>sit   VESTIBULAR TREATMENT:                                                                                                   DATE: 12/28/22   PATIENT EDUCATION: Education details: prognosis, POC, HEP, edu on test results, HEP Person educated: Patient Education method: Explanation, Demonstration, Tactile cues, Verbal cues, and Handouts Education comprehension: verbalized understanding and returned demonstration  HOME EXERCISE PROGRAM: Access Code: [email or web address] URL: [email or web address] Date: 12/29/2022 Prepared by: [facility] - Outpatient  Rehab - [facility]  Exercises - Seated Gaze Stabilization with Head Rotation  - 1 x daily - 5 x weekly - 2-3 sets - 30 sec hold - Seated  Gaze Stabilization with Head Nod  - 1 x daily - 5 x weekly - 2-3 sets - 30 sec hold  GOALS: Goals reviewed with patient? Yes  SHORT TERM GOALS: Target date: 01/12/2023  Patient to be independent with initial HEP. Baseline: HEP initiated Goal status: MET 01/13/23    LONG TERM GOALS: Target date: 01/26/2023  Patient to be independent with advanced HEP. Baseline: Not yet initiated  Goal status: IN PROGRESS  Patient to report 0/10 dizziness with standing vertical and horizontal VOR for 30 seconds. Baseline: Unable Goal status: IN PROGRESS  Patient will report 0/10 dizziness with bed mobility.  Baseline: Symptomatic  Goal status: IN PROGRESS  Patient to score at least 20/24 on DGI in order to decrease risk of falls. Baseline: 15 12/31/22 Goal status: IN PROGRESS 12/31/22   ASSESSMENT:  CLINICAL IMPRESSION:  Patient arrived to session with c/o L LBP without known cause. Denies radiation, N/T, B&B changes. Patient performed gentle guided lumbopelvic stretches to reduce pain. Patient did report improvement in pain. Gentle habituation and balance activities today included turns, SLS, and backwards walking. Cueing required to widen BOS and improve weight shift for max stability. Patient reported very minimal dizziness with activities today and denied LBP upon leaving.   OBJECTIVE IMPAIRMENTS: Abnormal gait, decreased activity tolerance, decreased balance, difficulty walking, dizziness, impaired flexibility, and postural dysfunction.   ACTIVITY LIMITATIONS: carrying, lifting, bending, sitting, standing, squatting, sleeping, stairs, transfers, bed mobility, bathing, toileting, dressing, reach over head, hygiene/grooming, and locomotion level  PARTICIPATION LIMITATIONS: meal prep, cleaning, laundry, driving, shopping, community activity, and church  PERSONAL FACTORS: Age, Past/current experiences, Time since onset of injury/illness/exacerbation, and 3+ comorbidities: A-fib, depression,  schizophrenia, seizures, R achilles repair, L distal radius ORIF  are also affecting patient's functional outcome.   REHAB POTENTIAL: Good  CLINICAL DECISION MAKING: Evolving/moderate complexity  EVALUATION COMPLEXITY: Moderate   PLAN:  PT FREQUENCY: 1-2x/week  PT DURATION: 4 weeks  PLANNED INTERVENTIONS: Therapeutic exercises, Therapeutic activity, Neuromuscular re-education, Balance training, Gait training, Patient/Family education, Self Care, Joint mobilization, Stair training, Vestibular training, Canalith repositioning, DME instructions, Aquatic Therapy, Dry Needling,  Electrical stimulation, Cryotherapy, Moist heat, Taping, Manual therapy, and Re-evaluation  PLAN FOR NEXT SESSION:  reassessment next session; continue with balance and habituation    Anette Guarneri, PT, DPT 01/20/23 11:20 AM  [facility] at [facility] [address] [place], [place] [address] Phone # [phone] Fax # [phone]

## 2023-01-20 ENCOUNTER — Ambulatory Visit: Payer: Medicare HMO | Admitting: Physical Therapy

## 2023-01-20 ENCOUNTER — Encounter: Payer: Self-pay | Admitting: Physical Therapy

## 2023-01-20 DIAGNOSIS — R42 Dizziness and giddiness: Secondary | ICD-10-CM

## 2023-01-20 DIAGNOSIS — R2681 Unsteadiness on feet: Secondary | ICD-10-CM | POA: Diagnosis not present

## 2023-01-21 ENCOUNTER — Telehealth: Payer: Self-pay

## 2023-01-21 NOTE — Telephone Encounter (Signed)
Patient LVM on nurse line requesting to speak to PCP about Ozempic dosage.  I attempted to call him back, however no answer.   Will await return call.

## 2023-01-22 NOTE — Therapy (Signed)
OUTPATIENT PHYSICAL THERAPY VESTIBULAR TREATMENT     Patient Name: [name] MRN: [id number] DOB:Sep 06, 1960, 62 y.o., male Today's Date: 01/22/2023  END OF SESSION:         Past Medical History:  Diagnosis Date   A-fib [facility])    Acute right-sided low back pain    Arrhythmia    Chronic a-fib (HCC)    Closed fracture of left distal radius    Contact dermatitis 04/16/2017   Depression    Depression    Hypothyroidism    Low back pain    Morbid obesity, BMI unknown (HCC)    Osteoarthritis    oa in bilateral knees   Schizophrenia (HCC)    Seizures (HCC)    Thyroid disease    Varicose veins    Past Surgical History:  Procedure Laterality Date   ACHILLES TENDON REPAIR  approx. 2004   right foot   CATARACT EXTRACTION     bilateral   OPEN REDUCTION INTERNAL FIXATION (ORIF) DISTAL RADIAL FRACTURE Left 07/21/2018   Procedure: OPEN REDUCTION INTERNAL FIXATION (ORIF) DISTAL RADIAL FRACTURE;  Surgeon: Betha Loa, MD;  Location: [facility];  Service: Orthopedics;  Laterality: Left;   stab phlebectomy  Right 11/09/2016   stab phlebectomy R leg by Josephina Gip MD   TOOTH EXTRACTION     Patient Active Problem List   Diagnosis Date Noted   Chronic constipation 12/21/2022   Belching 12/14/2022   DISH (diffuse idiopathic skeletal hyperostosis) 05/12/2022   Bilateral primary osteoarthritis of hip 08/13/2021   Statin intolerance 06/24/2021   Spinal stenosis of lumbar region 01/16/2020   Chronic lumbar radiculopathy 12/08/2019   Hyperlipidemia associated with type 2 diabetes mellitus (HCC) 12/08/2019   Vertigo 11/03/2019   Paronychia of left index finger 05/19/2019   Rosacea 10/24/2018   Loss of weight 09/09/2018   Schizoaffective disorder, depressive type (HCC) 04/08/2018   Varicose veins of bilateral lower extremities with other complications 02/03/2016   Erectile dysfunction 04/26/2015   Hypothyroidism    Seizures (HCC)    Persistent atrial  fibrillation (HCC)    BMI 34.0-34.9,adult    Acute bilateral low back pain without sciatica    Seborrheic dermatitis 02/10/2007   T2DM (type 2 diabetes mellitus) (HCC) 02/10/2007    PCP: Evelena Leyden, DO  REFERRING PROVIDER: Evelena Leyden, DO   REFERRING DIAG: R42 (ICD-10-CM) - Vertigo  THERAPY DIAG:  No diagnosis found.  ONSET DATE: 1-1.5 weeks   Rationale for Evaluation and Treatment: Rehabilitation  SUBJECTIVE:   SUBJECTIVE STATEMENT: "Ever since last session I think I did something to my back." Recalls hx of LBP which he received PT for. Denies radiation, N/T, B&B changes.   Pt accompanied by: self  PERTINENT HISTORY: A-fib, depression, schizophrenia, seizures, R achilles repair, L distal radius ORIF  PAIN:  Are you having pain? Yes: NPRS scale: 8/10 Pain location: L LB Pain description: sharp Aggravating factors: standing Relieving factors: has not tried    PRECAUTIONS: Fall  WEIGHT BEARING RESTRICTIONS: No  FALLS: Has patient fallen in last 6 months? No  LIVING ENVIRONMENT: Lives with: lives alone Lives in: House/apartment Stairs:  ~14 steps to enter; with handrail Has following equipment at home: None  PLOF: Independent; retired but gets some disability   PATIENT GOALS: improve dizziness  OBJECTIVE:    TODAY'S TREATMENT: 01/25/23 Activity Comments                       HOME EXERCISE PROGRAM Last updated:  01/13/23 Access Code: [email or web address] URL: [email or web address] Date: 01/13/2023 Prepared by: [facility] - Outpatient  Rehab - [facility]  Exercises - Seated Gaze Stabilization with Head Rotation  - 1 x daily - 5 x weekly - 2-3 sets - 30 sec hold - Seated Gaze Stabilization with Head Nod  - 1 x daily - 5 x weekly - 2-3 sets - 30 sec hold - Sit to Stand Without Arm Support  - 1 x daily - 5 x weekly - 2 sets - 5 reps - Feet Together Balance at [employer] Eyes Closed  - 1 x daily - 5 x weekly - 2 sets - 30 sec  hold    Below measures were taken at time of initial evaluation unless otherwise specified:   DIAGNOSTIC FINDINGS: none recent  COGNITION: Overall cognitive status: tangential speech   SENSATION: Pt reports intact B UEs/LEs  *pt has B resting hand tremors  POSTURE:  rounded shoulders and forward head  GAIT: Gait pattern: very slow, guarded gait Assistive device utilized: None Level of assistance: SBA   PATIENT SURVEYS:  Not performed  VESTIBULAR ASSESSMENT:  GENERAL OBSERVATION: looks like some burst blood vessels in the inferior R eye- pt reports seeing eye MD for this. Pt wears bifocals  OCULOMOTOR EXAM:  Ocular Alignment: normal  Ocular ROM: No Limitations  Spontaneous Nystagmus: absent  Gaze-Induced Nystagmus: absent  Smooth Pursuits: intact  Saccades:  delayed and several saccades to complete horizontal and vertical directions  Convergence/Divergence: 4 inch d/t L eye    VESTIBULAR - OCULAR REFLEX:   Slow VOR: slow and hesitant; c/o some dizziness in vertical direction   VOR Cancellation: Corrective Saccades to B directions   Head-Impulse Test: HIT Right: unable to get accurate result d/t excessive guarding  HIT Left: unable to get accurate result d/t excessive guarding  C/o dizziness     POSITIONAL TESTING:  Right Roll Test: negative Left Roll Test: negative Right Dix-Hallpike: negative (cervical ROM limiting testing significantly) Left Dix-Hallpike: negative (cervical ROM limiting testing significantly) Right Sidelying: negative; dizziness upon sitting up Left Sidelying: negative  *pt required min-mod A to for supine>sit   VESTIBULAR TREATMENT:                                                                                                   DATE: 12/28/22   PATIENT EDUCATION: Education details: prognosis, POC, HEP, edu on test results, HEP Person educated: Patient Education method: Explanation, Demonstration, Tactile cues, Verbal cues, and  Handouts Education comprehension: verbalized understanding and returned demonstration  HOME EXERCISE PROGRAM: Access Code: [email or web address] URL: [email or web address] Date: 12/29/2022 Prepared by: [facility] - Outpatient  Rehab - [facility]  Exercises - Seated Gaze Stabilization with Head Rotation  - 1 x daily - 5 x weekly - 2-3 sets - 30 sec hold - Seated Gaze Stabilization with Head Nod  - 1 x daily - 5 x weekly - 2-3 sets - 30 sec hold  GOALS: Goals reviewed with patient? Yes  SHORT TERM GOALS: Target date: 01/12/2023  Patient to be independent with initial HEP. Baseline: HEP initiated  Goal status: MET 01/13/23    LONG TERM GOALS: Target date: 01/26/2023  Patient to be independent with advanced HEP. Baseline: Not yet initiated  Goal status: IN PROGRESS  Patient to report 0/10 dizziness with standing vertical and horizontal VOR for 30 seconds. Baseline: Unable Goal status: IN PROGRESS  Patient will report 0/10 dizziness with bed mobility.  Baseline: Symptomatic  Goal status: IN PROGRESS  Patient to score at least 20/24 on DGI in order to decrease risk of falls. Baseline: 15 12/31/22 Goal status: IN PROGRESS 12/31/22   ASSESSMENT:  CLINICAL IMPRESSION:  Patient arrived to session with c/o L LBP without known cause. Denies radiation, N/T, B&B changes. Patient performed gentle guided lumbopelvic stretches to reduce pain. Patient did report improvement in pain. Gentle habituation and balance activities today included turns, SLS, and backwards walking. Cueing required to widen BOS and improve weight shift for max stability. Patient reported very minimal dizziness with activities today and denied LBP upon leaving.   OBJECTIVE IMPAIRMENTS: Abnormal gait, decreased activity tolerance, decreased balance, difficulty walking, dizziness, impaired flexibility, and postural dysfunction.   ACTIVITY LIMITATIONS: carrying, lifting, bending, sitting, standing, squatting,  sleeping, stairs, transfers, bed mobility, bathing, toileting, dressing, reach over head, hygiene/grooming, and locomotion level  PARTICIPATION LIMITATIONS: meal prep, cleaning, laundry, driving, shopping, community activity, and church  PERSONAL FACTORS: Age, Past/current experiences, Time since onset of injury/illness/exacerbation, and 3+ comorbidities: A-fib, depression, schizophrenia, seizures, R achilles repair, L distal radius ORIF  are also affecting patient's functional outcome.   REHAB POTENTIAL: Good  CLINICAL DECISION MAKING: Evolving/moderate complexity  EVALUATION COMPLEXITY: Moderate   PLAN:  PT FREQUENCY: 1-2x/week  PT DURATION: 4 weeks  PLANNED INTERVENTIONS: Therapeutic exercises, Therapeutic activity, Neuromuscular re-education, Balance training, Gait training, Patient/Family education, Self Care, Joint mobilization, Stair training, Vestibular training, Canalith repositioning, DME instructions, Aquatic Therapy, Dry Needling, Electrical stimulation, Cryotherapy, Moist heat, Taping, Manual therapy, and Re-evaluation  PLAN FOR NEXT SESSION:  reassessment next session; continue with balance and habituation    Anette Guarneri, PT, DPT 01/22/23 7:50 AM  [facility] at [facility] [address] [place], [place] [address] Phone # [phone] Fax # [phone]

## 2023-01-25 ENCOUNTER — Other Ambulatory Visit (HOSPITAL_COMMUNITY): Payer: Self-pay | Admitting: Psychiatry

## 2023-01-25 ENCOUNTER — Encounter: Payer: Self-pay | Admitting: Physical Therapy

## 2023-01-25 ENCOUNTER — Other Ambulatory Visit (HOSPITAL_COMMUNITY): Payer: Self-pay | Admitting: *Deleted

## 2023-01-25 ENCOUNTER — Ambulatory Visit: Payer: Medicare HMO | Admitting: Physical Therapy

## 2023-01-25 DIAGNOSIS — F339 Major depressive disorder, recurrent, unspecified: Secondary | ICD-10-CM

## 2023-01-25 DIAGNOSIS — R42 Dizziness and giddiness: Secondary | ICD-10-CM

## 2023-01-25 DIAGNOSIS — F419 Anxiety disorder, unspecified: Secondary | ICD-10-CM

## 2023-01-25 DIAGNOSIS — R251 Tremor, unspecified: Secondary | ICD-10-CM

## 2023-01-25 DIAGNOSIS — R2681 Unsteadiness on feet: Secondary | ICD-10-CM | POA: Diagnosis not present

## 2023-01-25 MED ORDER — DIVALPROEX SODIUM ER 500 MG PO TB24
500.0000 mg | ORAL_TABLET | Freq: Two times a day (BID) | ORAL | 0 refills | Status: DC
Start: 2023-01-25 — End: 2023-02-05

## 2023-01-27 ENCOUNTER — Ambulatory Visit: Payer: Medicare HMO | Admitting: Physical Therapy

## 2023-01-29 ENCOUNTER — Ambulatory Visit: Payer: Medicare HMO | Admitting: Student

## 2023-02-01 ENCOUNTER — Ambulatory Visit: Payer: Medicare HMO

## 2023-02-04 ENCOUNTER — Ambulatory Visit (HOSPITAL_COMMUNITY): Payer: Medicare HMO | Admitting: Psychiatry

## 2023-02-04 ENCOUNTER — Telehealth (HOSPITAL_COMMUNITY): Payer: Self-pay | Admitting: *Deleted

## 2023-02-04 NOTE — Telephone Encounter (Signed)
Pt's mother called to advise that pt missed appointment today because he couldn't find this office. Writer called home and cell numbers for pt with no answer.

## 2023-02-05 ENCOUNTER — Other Ambulatory Visit (HOSPITAL_COMMUNITY): Payer: Self-pay | Admitting: *Deleted

## 2023-02-05 ENCOUNTER — Other Ambulatory Visit (HOSPITAL_COMMUNITY): Payer: Self-pay | Admitting: Psychiatry

## 2023-02-05 ENCOUNTER — Telehealth (HOSPITAL_COMMUNITY): Payer: Self-pay | Admitting: Psychiatry

## 2023-02-05 DIAGNOSIS — F339 Major depressive disorder, recurrent, unspecified: Secondary | ICD-10-CM

## 2023-02-05 DIAGNOSIS — F419 Anxiety disorder, unspecified: Secondary | ICD-10-CM

## 2023-02-05 DIAGNOSIS — R251 Tremor, unspecified: Secondary | ICD-10-CM

## 2023-02-05 MED ORDER — BENZTROPINE MESYLATE 1 MG PO TABS: 1.0000 mg | ORAL_TABLET | Freq: Every day | ORAL | 1 refills | Status: DC

## 2023-02-05 MED ORDER — PERPHENAZINE 8 MG PO TABS
24.0000 mg | ORAL_TABLET | Freq: Every day | ORAL | 0 refills | Status: DC
Start: 2023-02-05 — End: 2023-02-23

## 2023-02-05 MED ORDER — PHENELZINE SULFATE 15 MG PO TABS
45.0000 mg | ORAL_TABLET | Freq: Two times a day (BID) | ORAL | 0 refills | Status: DC
Start: 2023-02-05 — End: 2023-02-23

## 2023-02-05 MED ORDER — DIVALPROEX SODIUM ER 500 MG PO TB24
500.0000 mg | ORAL_TABLET | Freq: Two times a day (BID) | ORAL | 1 refills | Status: DC
Start: 2023-02-05 — End: 2023-02-23

## 2023-02-05 NOTE — Telephone Encounter (Signed)
Patient is going to run out of his medication before his next follow up.  benztropine (COGENTIN) 1 MG tablet  divalproex (DEPAKOTE ER) 500 MG 24 hr tablet  perphenazine (TRILAFON) 8 MG tablet  phenelzine (NARDIL) 15 MG tablet    [id number] - [place], [place] - [address] AT [facility] [address] (Ph: [phone])

## 2023-02-10 NOTE — Telephone Encounter (Signed)
Opened in error

## 2023-02-12 ENCOUNTER — Ambulatory Visit (INDEPENDENT_AMBULATORY_CARE_PROVIDER_SITE_OTHER): Payer: Medicare HMO

## 2023-02-12 VITALS — Ht 70.0 in | Wt 238.0 lb

## 2023-02-12 DIAGNOSIS — Z0000 Encounter for general adult medical examination without abnormal findings: Secondary | ICD-10-CM

## 2023-02-13 NOTE — Progress Notes (Cosign Needed Addendum)
Subjective:   [name] is a 62 y.o. male who presents for an Initial Medicare Annual Wellness Visit.  Visit Complete: Virtual  I connected with  [name] on 02/12/23 by a audio enabled telemedicine application and verified that I am speaking with the correct person using two identifiers.  Patient Location: Home  Provider Location: Home Office  I discussed the limitations of evaluation and management by telemedicine. The patient expressed understanding and agreed to proceed.  Vital Signs: Unable to obtain new vitals due to this being a telehealth visit.  Review of Systems     Cardiac Risk Factors include: advanced age (>76men, >58 women);diabetes mellitus;dyslipidemia     Objective:    Today's Vitals   02/13/23 1706  Weight: 238 lb (108 kg)  Height: 5\' 10"  (1.778 m)   Body mass index is 34.15 kg/m.     02/13/2023    5:12 PM 12/30/2022    8:36 AM 12/29/2022   11:54 AM 12/21/2022    1:40 PM 12/14/2022   10:21 AM 09/08/2022   10:50 AM 04/02/2022    8:42 AM  Advanced Directives  Does Patient Have a Medical Advance Directive? No No Yes No No Yes No  Does patient want to make changes to medical advance directive?  No - Patient declined No - Patient declined      Would patient like information on creating a medical advance directive? Yes (MAU/Ambulatory/Procedural Areas - Information given) No - Patient declined     No - Patient declined    Current Medications (verified) Outpatient Encounter Medications as of 02/12/2023  Medication Sig   allopurinol (ZYLOPRIM) 300 MG tablet TAKE 1 TABLET BY MOUTH EVERY DAY   apixaban (ELIQUIS) 5 MG TABS tablet Take 1 tablet (5 mg total) by mouth 2 (two) times daily.   atorvastatin (LIPITOR) 40 MG tablet TAKE 1 TABLET BY MOUTH EVERY DAY   benztropine (COGENTIN) 1 MG tablet Take 1 tablet (1 mg total) by mouth daily.   divalproex (DEPAKOTE ER) 500 MG 24 hr tablet Take 1 tablet (500 mg total) by mouth 2 (two) times daily.    levothyroxine (SYNTHROID) 100 MCG tablet TAKE 1 TABLET BY MOUTH EVERY DAY   meclizine (ANTIVERT) 12.5 MG tablet Take 1 tablet (12.5 mg total) by mouth 2 (two) times daily as needed for dizziness. For dizziness while on plane   pantoprazole (PROTONIX) 20 MG tablet TAKE 1 TABLET BY MOUTH EVERY DAY   perphenazine (TRILAFON) 8 MG tablet Take 3 tablets (24 mg total) by mouth at bedtime.   phenelzine (NARDIL) 15 MG tablet Take 3 tablets (45 mg total) by mouth 2 (two) times daily.   Semaglutide,0.25 or 0.5MG /DOS, (OZEMPIC, 0.25 OR 0.5 MG/DOSE,) 2 MG/3ML SOPN Inject 0.25 mg into the skin once a week.   sildenafil (VIAGRA) 25 MG tablet Take 1 tablet (25 mg total) by mouth daily as needed for erectile dysfunction.   No facility-administered encounter medications on file as of 02/12/2023.    Allergies (verified) Lamictal [lamotrigine] and Metformin and related   History: Past Medical History:  Diagnosis Date   A-fib (HCC)    Acute right-sided low back pain    Arrhythmia    Chronic a-fib (HCC)    Closed fracture of left distal radius    Contact dermatitis 04/16/2017   Depression    Depression    Hypothyroidism    Low back pain    Morbid obesity, BMI unknown (HCC)    Osteoarthritis  oa in bilateral knees   Schizophrenia (HCC)    Seizures (HCC)    Thyroid disease    Varicose veins    Past Surgical History:  Procedure Laterality Date   ACHILLES TENDON REPAIR  approx. 2004   right foot   CATARACT EXTRACTION     bilateral   OPEN REDUCTION INTERNAL FIXATION (ORIF) DISTAL RADIAL FRACTURE Left 07/21/2018   Procedure: OPEN REDUCTION INTERNAL FIXATION (ORIF) DISTAL RADIAL FRACTURE;  Surgeon: Betha Loa, MD;  Location: [facility];  Service: Orthopedics;  Laterality: Left;   stab phlebectomy  Right 11/09/2016   stab phlebectomy R leg by Josephina Gip MD   TOOTH EXTRACTION     Family History  Problem Relation Age of Onset   Diabetes Father    Heart disease Father    Diabetes  Brother    OCD Mother    Social History   Socioeconomic History   Marital status: Single    Spouse name: Not on file   Number of children: Not on file   Years of education: Not on file   Highest education level: Not on file  Occupational History   Occupation: disabled  Tobacco Use   Smoking status: Never   Smokeless tobacco: Never  Vaping Use   Vaping status: Never Used  Substance and Sexual Activity   Alcohol use: No    Alcohol/week: 0.0 standard drinks of alcohol   Drug use: No   Sexual activity: Never  Other Topics Concern   Not on file  Social History Narrative   Not on file   Social Determinants of Health   Financial Resource Strain: Low Risk  (02/13/2023)   Overall Financial Resource Strain (CARDIA)    Difficulty of Paying Living Expenses: Not hard at all  Food Insecurity: No Food Insecurity (02/13/2023)   Hunger Vital Sign    Worried About Running Out of Food in the Last Year: Never true    Ran Out of Food in the Last Year: Never true  Transportation Needs: No Transportation Needs (02/13/2023)   PRAPARE - Administrator, Civil Service (Medical): No    Lack of Transportation (Non-Medical): No  Physical Activity: Inactive (02/13/2023)   Exercise Vital Sign    Days of Exercise per Week: 0 days    Minutes of Exercise per Session: 0 min  Stress: No Stress Concern Present (02/13/2023)   [employer] of Occupational Health - Occupational Stress Questionnaire    Feeling of Stress : Not at all  Social Connections: Socially Isolated (02/13/2023)   Social Connection and Isolation Panel [NHANES]    Frequency of Communication with Friends and Family: More than three times a week    Frequency of Social Gatherings with Friends and Family: Three times a week    Attends Religious Services: Never    Active Member of Clubs or Organizations: No    Attends Banker Meetings: Never    Marital Status: Never married    Tobacco Counseling Counseling  given: Not Answered   Clinical Intake:  Pre-visit preparation completed: Yes  Pain : No/denies pain     Diabetes: Yes CBG done?: No Did pt. bring in CBG monitor from home?: No  How often do you need to have someone help you when you read instructions, pamphlets, or other written materials from your doctor or pharmacy?: 1 - Never  Interpreter Needed?: No  Information entered by :: Kandis Fantasia LPN   Activities of Daily Living    02/13/2023  5:07 PM  In your present state of health, do you have any difficulty performing the following activities:  Hearing? 0  Vision? 0  Difficulty concentrating or making decisions? 0  Walking or climbing stairs? 0  Dressing or bathing? 0  Doing errands, shopping? 0  Preparing Food and eating ? N  Using the Toilet? N  In the past six months, have you accidently leaked urine? N  Do you have problems with loss of bowel control? N  Managing your Medications? N  Managing your Finances? N  Housekeeping or managing your Housekeeping? N    Patient Care Team: Tiffany Kocher, DO as PCP - General (Family Medicine) Lolly Mustache, Phillips Grout, MD as Consulting Physician (Psychiatry)  Indicate any recent Medical Services you may have received from other than [facility] providers in the past year (date may be approximate).     Assessment:   This is a routine wellness examination for Nigil.  Hearing/Vision screen Hearing Screening - Comments:: Denies hearing difficulties   Vision Screening - Comments:: No vision problems; will schedule routine eye exam soon    Dietary issues and exercise activities discussed:     Goals Addressed             This Visit's Progress    Remain active and independent        Depression Screen    02/13/2023    5:11 PM 12/30/2022    8:36 AM 12/21/2022    1:39 PM 12/14/2022   10:44 AM 11/17/2022   10:27 AM 09/08/2022   10:50 AM 04/02/2022    8:46 AM  PHQ 2/9 Scores  PHQ - 2 Score 0 0 0 0 0 0 3  PHQ- 9 Score 2 2 5 8 9  6 3     Fall Risk    02/13/2023    5:13 PM 12/30/2022    8:36 AM 04/02/2022    8:43 AM 03/02/2022    1:28 PM 02/06/2022    1:43 PM  Fall Risk   Falls in the past year? 0  1 1 1   Number falls in past yr: 0 0 1 1 0  Injury with Fall? 0 0 1 1 1   Risk for fall due to : No Fall Risks      Follow up Falls prevention discussed;Education provided;Falls evaluation completed        MEDICARE RISK AT HOME:  Medicare Risk at Home - 02/13/23 1713     Any stairs in or around the home? No    If so, are there any without handrails? No    Home free of loose throw rugs in walkways, pet beds, electrical cords, etc? Yes    Adequate lighting in your home to reduce risk of falls? Yes    Life alert? No    Use of a cane, walker or w/c? No    Grab bars in the bathroom? No    Shower chair or bench in shower? No    Elevated toilet seat or a handicapped toilet? No             TIMED UP AND GO:  Was the test performed? No    Cognitive Function:        02/13/2023    5:16 PM  6CIT Screen  What Year? 0 points  What month? 0 points  What time? 0 points  Count back from 20 0 points  Months in reverse 0 points  Repeat phrase 0 points  Total Score 0 points  Immunizations Immunization History  Administered Date(s) Administered   Influenza Split 05/23/2012, 05/14/2014   PFIZER Comirnaty(Gray Top)Covid-19 Tri-Sucrose Vaccine 09/28/2019, 10/19/2019, 06/27/2020   Tdap 12/07/2011, 09/26/2013    TDAP status: Due, Education has been provided regarding the importance of this vaccine. Advised may receive this vaccine at local pharmacy or Health Dept. Aware to provide a copy of the vaccination record if obtained from local pharmacy or Health Dept. Verbalized acceptance and understanding.  Flu Vaccine status: Due, Education has been provided regarding the importance of this vaccine. Advised may receive this vaccine at local pharmacy or Health Dept. Aware to provide a copy of the vaccination record if  obtained from local pharmacy or Health Dept. Verbalized acceptance and understanding.  Pneumococcal vaccine status: Up to date  Covid-19 vaccine status: Information provided on how to obtain vaccines.   Qualifies for Shingles Vaccine? Yes   Zostavax completed No   Shingrix Completed?: No.    Education has been provided regarding the importance of this vaccine. Patient has been advised to call insurance company to determine out of pocket expense if they have not yet received this vaccine. Advised may also receive vaccine at local pharmacy or Health Dept. Verbalized acceptance and understanding.  Screening Tests Health Maintenance  Topic Date Due   Colonoscopy  Never done   Zoster Vaccines- Shingrix (1 of 2) Never done   OPHTHALMOLOGY EXAM  03/01/2018   FOOT EXAM  12/18/2021   INFLUENZA VACCINE  02/04/2023   Diabetic kidney evaluation - eGFR measurement  06/08/2023 (Originally 12/31/2021)   HEMOGLOBIN A1C  06/15/2023   Diabetic kidney evaluation - Urine ACR  09/08/2023   DTaP/Tdap/Td (3 - Td or Tdap) 09/27/2023   Medicare Annual Wellness (AWV)  02/12/2024   Hepatitis C Screening  Completed   HIV Screening  Completed   HPV VACCINES  Aged Out   COVID-19 Vaccine  Discontinued    Health Maintenance  Health Maintenance Due  Topic Date Due   Colonoscopy  Never done   Zoster Vaccines- Shingrix (1 of 2) Never done   OPHTHALMOLOGY EXAM  03/01/2018   FOOT EXAM  12/18/2021   INFLUENZA VACCINE  02/04/2023    Colorectal cancer screening: Referral to GI placed 12/21/22. Pt aware the office will call re: appt.  Lung Cancer Screening: (Low Dose CT Chest recommended if Age 60-80 years, 20 pack-year currently smoking OR have quit w/in 15years.) does not qualify.   Lung Cancer Screening Referral: n/a  Additional Screening:  Hepatitis C Screening: does qualify; Completed 12/23/15  Vision Screening: Recommended annual ophthalmology exams for early detection of glaucoma and other disorders of  the eye. Is the patient up to date with their annual eye exam?  No  Who is the provider or what is the name of the office in which the patient attends annual eye exams? none If pt is not established with a provider, would they like to be referred to a provider to establish care? No .   Dental Screening: Recommended annual dental exams for proper oral hygiene  Diabetic Foot Exam: Diabetic Foot Exam: Overdue, Pt has been advised about the importance in completing this exam. Pt is scheduled for diabetic foot exam on at next office visit .  Community Resource Referral / Chronic Care Management: CRR required this visit?  No   CCM required this visit?  No    Plan:     I have personally reviewed and noted the following in the patient's chart:   Medical and social history  Use of alcohol, tobacco or illicit drugs  Current medications and supplements including opioid prescriptions. Patient is not currently taking opioid prescriptions. Functional ability and status Nutritional status Physical activity Advanced directives List of other physicians Hospitalizations, surgeries, and ER visits in previous 12 months Vitals Screenings to include cognitive, depression, and falls Referrals and appointments  In addition, I have reviewed and discussed with patient certain preventive protocols, quality metrics, and best practice recommendations. A written personalized care plan for preventive services as well as general preventive health recommendations were provided to patient.     Kandis Fantasia [place], [place]   08/05/8655   After Visit Summary: (MyChart) Due to this being a telephonic visit, the after visit summary with patients personalized plan was offered to patient via MyChart   Nurse Notes: Patient complaining of problems with vertigo but states that meclizine does not help.

## 2023-02-13 NOTE — Patient Instructions (Signed)
[name] , Thank you for taking time to come for your Medicare Wellness Visit. I appreciate your ongoing commitment to your health goals. Please review the following plan we discussed and let me know if I can assist you in the future.   Referrals/Orders/Follow-Ups/Clinician Recommendations: Aim for 30 minutes of exercise or brisk walking, 6-8 glasses of water, and 5 servings of fruits and vegetables each day.  This is a list of the screening recommended for you and due dates:  Health Maintenance  Topic Date Due   Colon Cancer Screening  Never done   Zoster (Shingles) Vaccine (1 of 2) Never done   Eye exam for diabetics  03/01/2018   Complete foot exam   12/18/2021   Flu Shot  02/04/2023   Yearly kidney function blood test for diabetes  06/08/2023*   Hemoglobin A1C  06/15/2023   Yearly kidney health urinalysis for diabetes  09/08/2023   DTaP/Tdap/Td vaccine (3 - Td or Tdap) 09/27/2023   Medicare Annual Wellness Visit  02/12/2024   Hepatitis C Screening  Completed   HIV Screening  Completed   HPV Vaccine  Aged Out   COVID-19 Vaccine  Discontinued  *Topic was postponed. The date shown is not the original due date.    Advanced directives: (ACP Link)Information on Advanced Care Planning can be found at [facility] Advance Health Care Directives Advance Health Care Directives ([email or web address]   Next Medicare Annual Wellness Visit scheduled for next year: Yes  Preventive Care 40-64 Years, Male Preventive care refers to lifestyle choices and visits with your health care provider that can promote health and wellness. What does preventive care include? A yearly physical exam. This is also called an annual well check. Dental exams once or twice a year. Routine eye exams. Ask your health care provider how often you should have your eyes checked. Personal lifestyle choices, including: Daily care of your teeth and gums. Regular physical activity. Eating a healthy  diet. Avoiding tobacco and drug use. Limiting alcohol use. Practicing safe sex. Taking low-dose aspirin every day starting at age 73. What happens during an annual well check? The services and screenings done by your health care provider during your annual well check will depend on your age, overall health, lifestyle risk factors, and family history of disease. Counseling  Your health care provider may ask you questions about your: Alcohol use. Tobacco use. Drug use. Emotional well-being. Home and relationship well-being. Sexual activity. Eating habits. Work and work Astronomer. Screening  You may have the following tests or measurements: Height, weight, and BMI. Blood pressure. Lipid and cholesterol levels. These may be checked every 5 years, or more frequently if you are over 61 years old. Skin check. Lung cancer screening. You may have this screening every year starting at age 35 if you have a 30-pack-year history of smoking and currently smoke or have quit within the past 15 years. Fecal occult blood test (FOBT) of the stool. You may have this test every year starting at age 43. Flexible sigmoidoscopy or colonoscopy. You may have a sigmoidoscopy every 5 years or a colonoscopy every 10 years starting at age 41. Prostate cancer screening. Recommendations will vary depending on your family history and other risks. Hepatitis C blood test. Hepatitis B blood test. Sexually transmitted disease (STD) testing. Diabetes screening. This is done by checking your blood sugar (glucose) after you have not eaten for a while (fasting). You may have this done every 1-3 years. Discuss your test results,  treatment options, and if necessary, the need for more tests with your health care provider. Vaccines  Your health care provider may recommend certain vaccines, such as: Influenza vaccine. This is recommended every year. Tetanus, diphtheria, and acellular pertussis (Tdap, Td) vaccine. You may  need a Td booster every 10 years. Zoster vaccine. You may need this after age 48. Pneumococcal 13-valent conjugate (PCV13) vaccine. You may need this if you have certain conditions and have not been vaccinated. Pneumococcal polysaccharide (PPSV23) vaccine. You may need one or two doses if you smoke cigarettes or if you have certain conditions. Talk to your health care provider about which screenings and vaccines you need and how often you need them. This information is not intended to replace advice given to you by your health care provider. Make sure you discuss any questions you have with your health care provider. Document Released: 07/19/2015 Document Revised: 03/11/2016 Document Reviewed: 04/23/2015 Elsevier Interactive Patient Education  2017 [employer].  Fall Prevention in the Home Falls can cause injuries. They can happen to people of all ages. There are many things you can do to make your home safe and to help prevent falls. What can I do on the outside of my home? Regularly fix the edges of walkways and driveways and fix any cracks. Remove anything that might make you trip as you walk through a door, such as a raised step or threshold. Trim any bushes or trees on the path to your home. Use bright outdoor lighting. Clear any walking paths of anything that might make someone trip, such as rocks or tools. Regularly check to see if handrails are loose or broken. Make sure that both sides of any steps have handrails. Any raised decks and porches should have guardrails on the edges. Have any leaves, snow, or ice cleared regularly. Use sand or salt on walking paths during winter. Clean up any spills in your garage right away. This includes oil or grease spills. What can I do in the bathroom? Use night lights. Install grab bars by the toilet and in the tub and shower. Do not use towel bars as grab bars. Use non-skid mats or decals in the tub or shower. If you need to sit down in the  shower, use a plastic, non-slip stool. Keep the floor dry. Clean up any water that spills on the floor as soon as it happens. Remove soap buildup in the tub or shower regularly. Attach bath mats securely with double-sided non-slip rug tape. Do not have throw rugs and other things on the floor that can make you trip. What can I do in the bedroom? Use night lights. Make sure that you have a light by your bed that is easy to reach. Do not use any sheets or blankets that are too big for your bed. They should not hang down onto the floor. Have a firm chair that has side arms. You can use this for support while you get dressed. Do not have throw rugs and other things on the floor that can make you trip. What can I do in the kitchen? Clean up any spills right away. Avoid walking on wet floors. Keep items that you use a lot in easy-to-reach places. If you need to reach something above you, use a strong step stool that has a grab bar. Keep electrical cords out of the way. Do not use floor polish or wax that makes floors slippery. If you must use wax, use non-skid floor wax. Do not  have throw rugs and other things on the floor that can make you trip. What can I do with my stairs? Do not leave any items on the stairs. Make sure that there are handrails on both sides of the stairs and use them. Fix handrails that are broken or loose. Make sure that handrails are as long as the stairways. Check any carpeting to make sure that it is firmly attached to the stairs. Fix any carpet that is loose or worn. Avoid having throw rugs at the top or bottom of the stairs. If you do have throw rugs, attach them to the floor with carpet tape. Make sure that you have a light switch at the top of the stairs and the bottom of the stairs. If you do not have them, ask someone to add them for you. What else can I do to help prevent falls? Wear shoes that: Do not have high heels. Have rubber bottoms. Are comfortable and  fit you well. Are closed at the toe. Do not wear sandals. If you use a stepladder: Make sure that it is fully opened. Do not climb a closed stepladder. Make sure that both sides of the stepladder are locked into place. Ask someone to hold it for you, if possible. Clearly mark and make sure that you can see: Any grab bars or handrails. First and last steps. Where the edge of each step is. Use tools that help you move around (mobility aids) if they are needed. These include: Canes. Walkers. Scooters. Crutches. Turn on the lights when you go into a dark area. Replace any light bulbs as soon as they burn out. Set up your furniture so you have a clear path. Avoid moving your furniture around. If any of your floors are uneven, fix them. If there are any pets around you, be aware of where they are. Review your medicines with your doctor. Some medicines can make you feel dizzy. This can increase your chance of falling. Ask your doctor what other things that you can do to help prevent falls. This information is not intended to replace advice given to you by your health care provider. Make sure you discuss any questions you have with your health care provider. Document Released: 04/18/2009 Document Revised: 11/28/2015 Document Reviewed: 07/27/2014 Elsevier Interactive Patient Education  2017 [employer].

## 2023-02-19 ENCOUNTER — Encounter: Payer: Self-pay | Admitting: Student

## 2023-02-19 ENCOUNTER — Ambulatory Visit: Payer: Medicare HMO | Admitting: Student

## 2023-02-19 ENCOUNTER — Other Ambulatory Visit: Payer: Self-pay

## 2023-02-19 VITALS — BP 121/79 | HR 79 | Ht 70.0 in | Wt 245.6 lb

## 2023-02-19 DIAGNOSIS — R42 Dizziness and giddiness: Secondary | ICD-10-CM

## 2023-02-19 DIAGNOSIS — E119 Type 2 diabetes mellitus without complications: Secondary | ICD-10-CM

## 2023-02-19 MED ORDER — SEMAGLUTIDE (1 MG/DOSE) 4 MG/3ML ~~LOC~~ SOPN: 1.0000 mg | PEN_INJECTOR | SUBCUTANEOUS | 1 refills | Status: DC

## 2023-02-19 NOTE — Assessment & Plan Note (Addendum)
Last A1c in goal.  Controlled with Ozempic, however patient has gained weight.  Plan to titrate Ozempic to 1 mg daily.  Prescription sent, he will also use the remainder of his 0.5 mg pen. - Follow-up in approximately 2 weeks

## 2023-02-19 NOTE — Progress Notes (Signed)
    SUBJECTIVE:   CHIEF COMPLAINT / HPI:   Vertigo Completed vestibular rehab on 01/25/2023.  Reports no improvement with meclizine.  Patient has limited range of motion of neck, may be contributing.  He has not seen a specialist for this problem.  Denies hearing loss, tinnitus, other neurologic symptoms.  Vertigo is intermittent, and primarily with waking in the morning and sometimes turning the head too fast.  Type diabetes Patient reports he is on Ozempic, states he has gained weight while on 0.5 mg.  Additionally last A1c within goal.  He is wondering if we can titrate higher, which is reasonable.  OBJECTIVE:   BP 121/79   Pulse 79   Ht 5\' 10"  (1.778 m)   Wt 245 lb 9.6 oz (111.4 kg)   SpO2 95%   BMI 35.24 kg/m    General: NAD, pleasant, able to participate exam HEENT: Normocephalic, atraumatic head.  Impacted cerumen on left ear. EOM intact and normal conjunctiva BL. Normal external nose. Cardio: RRR, no MRG. Respiratory: CTAB, normal wob on RA GI: Abdomen is soft, not tender, not distended. BS present Skin: Warm and dry Neuro: CN II: PERRL CN III, IV,VI: EOMI CV V: Normal sensation in V1, V2, V3 CVII: Symmetric smile and brow raise CN VIII: Normal hearing CN IX,X: Symmetric palate raise  CN XI: 5/5 shoulder shrug CN XII: Symmetric tongue protrusion Dix-Hallpike and Epley maneuver limited by reduced ROM of neck.  ASSESSMENT/PLAN:   Type 2 diabetes mellitus without complication, without long-term current use of insulin (HCC) Assessment & Plan: Last A1c in goal.  Controlled with Ozempic, however patient has gained weight.  Plan to titrate Ozempic to 1 mg daily.  Prescription sent, he will also use the remainder of his 0.5 mg pen. - Follow-up in approximately 2 weeks  Orders: -     Semaglutide (1 MG/DOSE); Inject 1 mg into the skin once a week.  Dispense: 3 mL; Refill: 1  Vertigo Assessment & Plan: Symptoms are stable.  Completed vestibular therapy. No red flag  symptoms.  Discussed ENT referral, patient would like to think about it first.  Vertigo episodes are short and not limiting daily function. - Follow-up as needed, consider ENT referral    Follow-up recommendations Discussed care gaps Ear irrigation Obtain A1c Foot exam  Tiffany Kocher, DO [facility]

## 2023-02-19 NOTE — Assessment & Plan Note (Addendum)
Symptoms are stable.  Completed vestibular therapy. No red flag symptoms.  Discussed ENT referral, patient would like to think about it first.  Vertigo episodes are short and not limiting daily function. - Follow-up as needed, consider ENT referral

## 2023-02-19 NOTE — Patient Instructions (Signed)
It was great to see you! Thank you for allowing me to participate in your care!   I recommend that you always bring your medications to each appointment as this makes it easy to ensure we are on the correct medications and helps [place] not miss when refills are needed.  Our plans for today:  -Please increase Ozempic dosing to 1.0 mg weekly, I have sent a prescription.  You will follow-up with me in about 2 weeks for A1c recheck and to check on medication dosing. -We will consider ENT referral later in the future for vertigo symptoms.  For now I recommend trying to avoid maneuvers that cause dizziness. -Please use over-the-counter Debrox a few days before your next appointment so we can wash out your ears. -Please follow-up with your psychiatrist.  My question for them is if vertigo may be a symptom of the current medications you are on.   Take care and seek immediate care sooner if you develop any concerns. Please remember to show up 15 minutes before your scheduled appointment time!  Tiffany Kocher, DO [facility]

## 2023-02-23 ENCOUNTER — Ambulatory Visit (HOSPITAL_COMMUNITY): Payer: Medicare HMO | Admitting: Psychiatry

## 2023-02-23 ENCOUNTER — Other Ambulatory Visit (HOSPITAL_COMMUNITY): Payer: Self-pay

## 2023-02-23 ENCOUNTER — Encounter: Payer: Self-pay | Admitting: Student

## 2023-02-23 ENCOUNTER — Other Ambulatory Visit (HOSPITAL_COMMUNITY): Payer: Self-pay | Admitting: Psychiatry

## 2023-02-23 ENCOUNTER — Other Ambulatory Visit: Payer: Self-pay | Admitting: Student

## 2023-02-23 ENCOUNTER — Telehealth (HOSPITAL_COMMUNITY): Payer: Self-pay | Admitting: *Deleted

## 2023-02-23 ENCOUNTER — Encounter (HOSPITAL_COMMUNITY): Payer: Self-pay | Admitting: Psychiatry

## 2023-02-23 DIAGNOSIS — R251 Tremor, unspecified: Secondary | ICD-10-CM

## 2023-02-23 DIAGNOSIS — F339 Major depressive disorder, recurrent, unspecified: Secondary | ICD-10-CM

## 2023-02-23 DIAGNOSIS — F419 Anxiety disorder, unspecified: Secondary | ICD-10-CM

## 2023-02-23 DIAGNOSIS — Z5181 Encounter for therapeutic drug level monitoring: Secondary | ICD-10-CM

## 2023-02-23 DIAGNOSIS — Z79899 Other long term (current) drug therapy: Secondary | ICD-10-CM | POA: Diagnosis not present

## 2023-02-23 MED ORDER — PERPHENAZINE 8 MG PO TABS
24.0000 mg | ORAL_TABLET | Freq: Every day | ORAL | 0 refills | Status: DC
Start: 2023-02-23 — End: 2023-04-19

## 2023-02-23 MED ORDER — DIVALPROEX SODIUM ER 500 MG PO TB24
500.0000 mg | ORAL_TABLET | Freq: Two times a day (BID) | ORAL | 2 refills | Status: DC
Start: 2023-02-23 — End: 2023-04-19

## 2023-02-23 MED ORDER — PHENELZINE SULFATE 15 MG PO TABS
45.0000 mg | ORAL_TABLET | Freq: Two times a day (BID) | ORAL | 0 refills | Status: DC
Start: 2023-02-23 — End: 2023-04-19

## 2023-02-23 MED ORDER — BENZTROPINE MESYLATE 1 MG PO TABS
1.0000 mg | ORAL_TABLET | Freq: Every day | ORAL | 2 refills | Status: DC
Start: 2023-02-23 — End: 2023-04-19

## 2023-02-23 NOTE — Telephone Encounter (Signed)
Writer received VM form pt's mother stating that pt had fallen this morning and hit his elbow and was wanting to reschedule his 1130 appointment today. Writer called and spoke with pt who said he fell out of bed this morning and hit his elbow. Writer advised pt that he has not been seen since 08/13/22 and has had cx and last visit was a No Show and has had several refills. Pt did admit that he was trying to see another provider outside this practice. This nurse advised that pt keep appointment today if at all possible as it may take several months to get an appointment with new provider and he will need his medications in the interim. Pt agreed and stated he will be in this office @ 1130 for appointment.

## 2023-02-23 NOTE — Progress Notes (Signed)
BH MD/PA/NP OP Progress Note  02/23/2023 11:51 AM [name]  MRN:  [id number]  Chief Complaint:  Chief Complaint  Patient presents with   Follow-up   HPI: Patient came for his follow-up appointment.  This is his in person visit.  In the beginning he is frustrated and upset because he is having a lot of somatic complaints including vertigo, tremors and shakes and he feels no one helping him.  This morning he earlier called to cancel the visit because he felt pain in his elbow but he showed up for his appointment after feeling better and able to drive.  He recently had a visit with his primary care but vertigo remains unrelieved.  He admitted sometimes feeling sad and isolated because he does not have any friends and he tried to socialize with the females but has not had luck so far.  He does not want to communicate through the Internet because he realizes about scam.  He feels bored and isolated.  He is in Depakote, Nardil, perphenazine and Cogentin.  He reported having issues with the mother who sometime he feels get into his nerves.  However he tried to visit her and sometimes she comes from [place] to visit him.  Patient told she is a 62 year old and it has been difficult for her to drive from [place].  He lived by himself and tried to watch TV and hockey games.  He denies any paranoia, hallucination, suicidal thoughts or homicidal thoughts.  He is concerned about his general health.  He has tremors, shakes, memory impairment, attention and concentration issues.  We have recommended few times to see the neurologist.  Patient told he saw the neurology however we do not have any notes from neurology.  He has balance issues and sometime his gait is unsteady and he uses cain sometimes.  He is able to drive.  He is not interested in therapy because he believes he had done therapy all his life and he knows how it works.  He also does not want to change his medication because finally he feel his  depression and anxiety is controlled with current regimen.  He admitted to symptoms are chronic and may not go away but his major concern is about his general health especially tremors, shakes, memory and now vertigo.  He started taking Ozempic and his blood sugar has been much better but he is not sure if Ozempic causes the vertigo.  He denies any violence, anger, feeling of hopelessness.  Visit Diagnosis:    ICD-10-CM   1. Tremor  R25.1     2. MDD (major depressive disorder), recurrent episode, with atypical features (HCC)  F33.9     3. Anxiety  F41.9       Past Psychiatric History: Reviewed H/O multiple inpatient from [phone] for severe depression, suicidal thoughts. No h/o suicidal attempt. H/O OCD, schizoaffective disorder and major depression.  Tried numerous medication including TCAs.  We tried Lamictal but he develop a rash.   Past Medical History:  Past Medical History:  Diagnosis Date   A-fib (HCC)    Acute right-sided low back pain    Arrhythmia    Chronic a-fib (HCC)    Closed fracture of left distal radius    Contact dermatitis 04/16/2017   Depression    Depression    Hypothyroidism    Low back pain    Morbid obesity, BMI unknown (HCC)    Osteoarthritis    oa in bilateral knees   Schizophrenia (  HCC)    Seizures (HCC)    Thyroid disease    Varicose veins     Past Surgical History:  Procedure Laterality Date   ACHILLES TENDON REPAIR  approx. 2004   right foot   CATARACT EXTRACTION     bilateral   OPEN REDUCTION INTERNAL FIXATION (ORIF) DISTAL RADIAL FRACTURE Left 07/21/2018   Procedure: OPEN REDUCTION INTERNAL FIXATION (ORIF) DISTAL RADIAL FRACTURE;  Surgeon: Betha Loa, MD;  Location: [facility];  Service: Orthopedics;  Laterality: Left;   stab phlebectomy  Right 11/09/2016   stab phlebectomy R leg by Josephina Gip MD   TOOTH EXTRACTION      Family Psychiatric History: Reviewed  Family History:  Family History  Problem Relation Age of  Onset   Diabetes Father    Heart disease Father    Diabetes Brother    OCD Mother     Social History:  Social History   Socioeconomic History   Marital status: Single    Spouse name: Not on file   Number of children: Not on file   Years of education: Not on file   Highest education level: Not on file  Occupational History   Occupation: disabled  Tobacco Use   Smoking status: Never   Smokeless tobacco: Never  Vaping Use   Vaping status: Never Used  Substance and Sexual Activity   Alcohol use: No    Alcohol/week: 0.0 standard drinks of alcohol   Drug use: No   Sexual activity: Never  Other Topics Concern   Not on file  Social History Narrative   Not on file   Social Determinants of Health   Financial Resource Strain: Low Risk  (02/13/2023)   Overall Financial Resource Strain (CARDIA)    Difficulty of Paying Living Expenses: Not hard at all  Food Insecurity: No Food Insecurity (02/13/2023)   Hunger Vital Sign    Worried About Running Out of Food in the Last Year: Never true    Ran Out of Food in the Last Year: Never true  Transportation Needs: No Transportation Needs (02/13/2023)   PRAPARE - Administrator, Civil Service (Medical): No    Lack of Transportation (Non-Medical): No  Physical Activity: Inactive (02/13/2023)   Exercise Vital Sign    Days of Exercise per Week: 0 days    Minutes of Exercise per Session: 0 min  Stress: No Stress Concern Present (02/13/2023)   [employer] of Occupational Health - Occupational Stress Questionnaire    Feeling of Stress : Not at all  Social Connections: Socially Isolated (02/13/2023)   Social Connection and Isolation Panel [NHANES]    Frequency of Communication with Friends and Family: More than three times a week    Frequency of Social Gatherings with Friends and Family: Three times a week    Attends Religious Services: Never    Active Member of Clubs or Organizations: No    Attends Banker  Meetings: Never    Marital Status: Never married    Allergies:  Allergies  Allergen Reactions   Lamictal [Lamotrigine] Rash   Metformin And Related Rash    Metabolic Disorder Labs: Lab Results  Component Value Date   HGBA1C 6.4 12/14/2022   MPG 146 (H) 04/24/2015   MPG 140 02/05/2007   No results found for: "PROLACTIN" Lab Results  Component Value Date   CHOL 126 09/08/2022   TRIG 178 (H) 09/08/2022   HDL 32 (L) 09/08/2022   CHOLHDL 3.9 09/08/2022  VLDL 60 (H) 07/16/2016   LDLCALC 64 09/08/2022   LDLCALC 113 (H) 12/18/2020   Lab Results  Component Value Date   TSH 1.330 12/18/2020   TSH 1.960 11/02/2019    Therapeutic Level Labs: No results found for: "LITHIUM" Lab Results  Component Value Date   VALPROATE 48 (L) 01/21/2022   VALPROATE 58 09/15/2021   No results found for: "CBMZ"  Current Medications: Current Outpatient Medications  Medication Sig Dispense Refill   allopurinol (ZYLOPRIM) 300 MG tablet TAKE 1 TABLET BY MOUTH EVERY DAY 90 tablet 1   apixaban (ELIQUIS) 5 MG TABS tablet Take 1 tablet (5 mg total) by mouth 2 (two) times daily. 180 tablet 2   atorvastatin (LIPITOR) 40 MG tablet TAKE 1 TABLET BY MOUTH EVERY DAY 90 tablet 1   benztropine (COGENTIN) 1 MG tablet Take 1 tablet (1 mg total) by mouth daily. 30 tablet 1   divalproex (DEPAKOTE ER) 500 MG 24 hr tablet Take 1 tablet (500 mg total) by mouth 2 (two) times daily. 60 tablet 1   levothyroxine (SYNTHROID) 100 MCG tablet TAKE 1 TABLET BY MOUTH EVERY DAY 90 tablet 1   meclizine (ANTIVERT) 12.5 MG tablet Take 1 tablet (12.5 mg total) by mouth 2 (two) times daily as needed for dizziness. For dizziness while on plane 6 tablet 0   pantoprazole (PROTONIX) 20 MG tablet TAKE 1 TABLET BY MOUTH EVERY DAY 60 tablet 0   perphenazine (TRILAFON) 8 MG tablet Take 3 tablets (24 mg total) by mouth at bedtime. 180 tablet 0   phenelzine (NARDIL) 15 MG tablet Take 3 tablets (45 mg total) by mouth 2 (two) times daily.  360 tablet 0   Semaglutide, 1 MG/DOSE, 4 MG/3ML SOPN Inject 1 mg into the skin once a week. 3 mL 1   sildenafil (VIAGRA) 25 MG tablet Take 1 tablet (25 mg total) by mouth daily as needed for erectile dysfunction. 10 tablet 0   No current facility-administered medications for this visit.     Musculoskeletal: Strength & Muscle Tone: decreased Gait & Station:  sometimes unstaedy, uses cain Patient leans: N/A  Psychiatric Specialty Exam: Review of Systems  Constitutional:  Positive for fatigue.  Musculoskeletal:  Positive for back pain.  Neurological:  Positive for tremors.    Blood pressure 132/83, pulse 82, height 5' 10.5" (1.791 m), weight 241 lb (109.3 kg).There is no height or weight on file to calculate BMI.  General Appearance: Fairly Groomed  Eye Contact:  Fair  Speech:  Slow  Volume:  Decreased  Mood:  Dysphoric and sad  Affect:  Constricted  Thought Process:  Descriptions of Associations: Intact  Orientation:  Full (Time, Place, and Person)  Thought Content: Rumination   Suicidal Thoughts:  No  Homicidal Thoughts:  No  Memory:  Immediate;   Fair Recent;   Fair Remote;   Fair  Judgement:  Fair  Insight:  Shallow  Psychomotor Activity:  Decreased and Tremor  Concentration:  Concentration: Fair and Attention Span: Fair  Recall:  [employer] of Knowledge: Fair  Language: Fair  Akathisia:  No  Handed:  Right  AIMS (if indicated): not done  Assets:  Communication Skills Desire for Improvement Housing Transportation  ADL's:  Intact  Cognition: Impaired,  Mild  Sleep:  Fair   Screenings: [employer] Row Office Visit from 02/19/2023 in [facility] Clinical Support from 02/12/2023 in [facility] Office Visit from 12/30/2022 in [facility] Office Visit from 12/21/2022 in [facility] Office Visit from 12/14/2022 in [facility]  PHQ-2 Total Score 0 0 0 0  0  PHQ-9 Total Score 2 2 2 5 8       Flowsheet Row ED from 12/21/2021 in [facility] Department at [facility] ED from 09/17/2021 in [facility] Emergency Department at [facility] Video Visit from 12/06/2020 in [facility]  C-SSRS RISK CATEGORY No Risk No Risk No Risk        Assessment and Plan: I discussed boredom, social isolation and recommend need to find places or senior citizen group to engage himself for socialization.  Patient not interested in therapy.  He also not interested to change his medication.  I emphasized that he need to see neurology because most of his symptoms should addressed by neurology.  He has vertigo, memory issues, tremors, shakes.  I offered reducing the dose or trying a different medication if that helped the tremors but patient refused and does not want to change the medication.  We will get the Depakote level.  I reviewed blood work results since Depakote level was not done.  His hemoglobin A1c improved from the past.  He liked to have message to his primary care to address his chronic physical symptoms.  We will continue Cogentin 1 mg daily, Trilafon 24 mg at bedtime, Depakote 500 mg 2 times a day and Nardil 45 mg 2 times a day.  Patient is very well aware about TCA side effects, interaction with diet.  He has been taking these medication for many years.  I will forward my note to his primary care.  Recommend to call us back if is any question or any concern.  Follow-up in 2 months in person.  Collaboration of Care: Collaboration of Care: Other provider involved in patient's care AEB we will forward my note to primary care.  Patient needs neurology consultation.  Patient/Guardian was advised Release of Information must be obtained prior to any record release in order to collaborate their care with an outside provider. Patient/Guardian was advised if they have not already done so to contact the  registration department to sign all necessary forms in order for [place] to release information regarding their care.   Consent: Patient/Guardian gives verbal consent for treatment and assignment of benefits for services provided during this visit. Patient/Guardian expressed understanding and agreed to proceed.    Cleotis Nipper, MD 02/23/2023, 11:51 AM

## 2023-02-23 NOTE — Progress Notes (Signed)
Patient experiencing Parkinson-like tremor and gait as well as refractory vertigo.  Sent to psychiatry for medication review.  Importantly patient is on perphenazine as well as Cogentin.  Also taking Nardil and Depakote.  Per psychiatry documentation, patient does not wish to make changes in medical management due to improved mental health.  Medication side effect remains high on differential for tremor and gait, however unclear cause of vertigo.  Had consider ENT referral for vertigo, patient declined.  Psychiatry recommends referral to neurology, and I agree with their assessment.  Referral to neurology sent.

## 2023-02-26 ENCOUNTER — Telehealth (HOSPITAL_COMMUNITY): Payer: Self-pay | Admitting: *Deleted

## 2023-02-26 LAB — VALPROIC ACID LEVEL: Valproic Acid Lvl: 88 ug/mL (ref 50–100)

## 2023-02-26 NOTE — Telephone Encounter (Signed)
Valproic Acid level results received from [facility].   Valproic Acid WNL @ 88 micrograms/ml.

## 2023-03-05 ENCOUNTER — Ambulatory Visit: Payer: Self-pay | Admitting: Student

## 2023-03-08 ENCOUNTER — Other Ambulatory Visit (HOSPITAL_COMMUNITY): Payer: Self-pay | Admitting: Psychiatry

## 2023-03-08 DIAGNOSIS — F339 Major depressive disorder, recurrent, unspecified: Secondary | ICD-10-CM

## 2023-03-08 DIAGNOSIS — R251 Tremor, unspecified: Secondary | ICD-10-CM

## 2023-03-12 ENCOUNTER — Other Ambulatory Visit: Payer: Self-pay | Admitting: Student

## 2023-03-15 ENCOUNTER — Encounter: Payer: Self-pay | Admitting: Pharmacist

## 2023-03-15 ENCOUNTER — Ambulatory Visit (INDEPENDENT_AMBULATORY_CARE_PROVIDER_SITE_OTHER): Payer: Medicare HMO | Admitting: Pharmacist

## 2023-03-15 VITALS — BP 109/86 | HR 80 | Resp 96 | Ht 69.0 in | Wt 225.6 lb

## 2023-03-15 DIAGNOSIS — E119 Type 2 diabetes mellitus without complications: Secondary | ICD-10-CM

## 2023-03-15 NOTE — Patient Instructions (Signed)
 It was nice to see you today!  Your goal blood sugar is 80-130 before eating and less than 180 after eating.  Medication Changes: Continue all medication the same.   Keep up the good work with diet and exercise. Aim for a diet full of vegetables, fruit and lean meats (chicken, [place], fish). Try to limit salt intake by eating fresh or frozen vegetables (instead of canned), rinse canned vegetables prior to cooking and do not add any additional salt to meals.

## 2023-03-15 NOTE — Assessment & Plan Note (Signed)
Diabetes longstanding currently controlled with latest A1c (12/14/22) of 6.4% and patient has lost weight ~24 lbs since last visit. Patient is able to verbalize appropriate hypoglycemia management plan. Medication adherence appears good.  Strength/muscle loss concern was discussed.  - Continued Ozempic (semaglutide) 1 mg once weekly.  - Thoroughly re-educated patient on how to administer dose of Ozempic.  Patient appeared capable of performing all steps in the injection therapy.  - Encouraged patient to go to [employer] ~3 times/wk and to focus on a high protein diet. -Patient educated on purpose, proper use, and potential adverse effects.  -Extensively discussed pathophysiology of diabetes, recommended lifestyle interventions, dietary effects on blood sugar control.  -Counseled on s/sx of and management of hypoglycemia.

## 2023-03-15 NOTE — Progress Notes (Signed)
S:     Chief Complaint  Patient presents with  . Medication Management    Diabetes    62 y.o. male who presents for diabetes evaluation, education, and management. Patient arrives in good spirits and presents without any assistance. Presents with slow gate, and bilateral arm tremors.  Patient was referred and last seen by Primary Care Provider, Dr. Claudean Severance, on 02/19/23.   PMH is significant for significant for HLD, DM2, afib, schizoaffective disorder.  At last visit with Dr. Claudean Severance, titrated Ozempic (semaglutide) to 1 mg weekly. Patient has lost ~24 lbs since April. He verbalizes his goal is to decrease his weight to 180 lbs.  Patient reports Diabetes was diagnosed in 2008.   Family/Social History: other family members have diabetes   Current diabetes medications include: Ozempic (semaglutide) 1 mg once weekly Current hypertension medications include: none Current hyperlipidemia medications include: atorvastatin 40 mg once daily  Patient reports adherence to taking all medications as prescribed.   Do you feel that your medications are working for you? yes Have you been experiencing any side effects to the medications prescribed? Yes - patient reports experiencing mild nausea once last week after taking Ozempic (semaglutide). Insurance coverage: [employer] Medicare/[place] Medicaid Patient denies hypoglycemic events.  Denies checking any glucose levels at home.   Patient reports eating most meals from restaurants, but says appetite has diminished so reports having plenty of leftovers. However, he does not eat leftovers.  Patient reported dietary habits: Eats 3 meals/day Breakfast: [employer] protein shakes Lunch: pizza or rice, beans, and fajitas Dinner: chicken ([employer], [employer]), occasionally eats "fish when mom makes it" Snacks: enjoys fruits and vegetables  Patient-reported exercise habits: patient goes to physical therapy to aid in muscle strength, wants to "get back to [employer]"  Patient reports mother and brother are currently taking multivitamins. Patient asks whether he should be taking one as well.  O:   Review of Systems  Eyes:  Positive for redness (Left eye - patient reports it is from blood vessels bursting and says it occasionally itches).  Neurological:  Positive for tremors (Patient has tremor at baseline.).  All other systems reviewed and are negative.   Physical Exam Constitutional:      Appearance: Normal appearance.  Pulmonary:     Effort: Pulmonary effort is normal.  Neurological:     Mental Status: He is alert.     Motor: Weakness present.  Psychiatric:        Mood and Affect: Mood normal.        Behavior: Behavior normal.        Thought Content: Thought content normal.        Judgment: Judgment normal.    Lab Results  Component Value Date   HGBA1C 6.4 12/14/2022   Vitals:   03/15/23 0954  BP: 109/86  Pulse: 80  Resp: (!) 96    Lipid Panel     Component Value Date/Time   CHOL 126 09/08/2022 1525   TRIG 178 (H) 09/08/2022 1525   HDL 32 (L) 09/08/2022 1525   CHOLHDL 3.9 09/08/2022 1525   CHOLHDL 8.6 (H) 07/16/2016 1446   VLDL 60 (H) 07/16/2016 1446   LDLCALC 64 09/08/2022 1525    Clinical Atherosclerotic Cardiovascular Disease (ASCVD): No   A/P: Diabetes longstanding currently controlled with latest A1c (12/14/22) of 6.4% and patient has lost weight ~24 lbs since last visit. Patient is able to verbalize appropriate hypoglycemia management plan. Medication adherence appears good.  Strength/muscle loss concern  was discussed.  - Continued Ozempic (semaglutide) 1 mg once weekly.  - Thoroughly re-educated patient on how to administer dose of Ozempic.  Patient appeared capable of performing all steps in the injection therapy.  - Encouraged patient to go to [employer] ~3 times/wk and to focus on a high protein diet. -Patient educated on purpose, proper use, and potential adverse effects.  -Extensively discussed  pathophysiology of diabetes, recommended lifestyle interventions, dietary effects on blood sugar control.  -Counseled on s/sx of and management of hypoglycemia.  ASCVD risk - primary prevention in patient with diabetes. Last LDL is 64 at goal of <70 mg/dL.  -Continued atorvastatin 40 mg once daily.  - Patient reports not having any symptoms of heartburn or belching. Advised patient to discuss with Dr. Claudean Severance potential discontinuation of pantoprazole.  - Patient reports mother and brother taking multivitamins and asks if he should be as well. Advised patient that multivitamins are a good adjunct to a healthy diet.   - History of schizoaffective / depressive disorder.  Stable and wishes to maintain current regimen despite multiple drug related side-effects.  Remains reluctant to consider other options which likely have fewer side effects.  States "I have tried everything".   Continue to encourage consideration of options for change at future visits  Written patient instructions provided. Patient verbalized understanding of treatment plan.  Total time in face to face counseling 42 minutes.    Follow-up:  Pharmacist PRN PCP clinic visit with Dr. Claudean Severance on 03/25/2013 Patient seen with Alesia Banda, PharmD Candidate and Andee Poles, PharmD Candidate.

## 2023-03-26 ENCOUNTER — Ambulatory Visit (INDEPENDENT_AMBULATORY_CARE_PROVIDER_SITE_OTHER): Payer: Medicare HMO | Admitting: Student

## 2023-03-26 ENCOUNTER — Encounter: Payer: Self-pay | Admitting: Student

## 2023-03-26 VITALS — BP 81/68 | HR 51 | Wt 219.1 lb

## 2023-03-26 DIAGNOSIS — W19XXXA Unspecified fall, initial encounter: Secondary | ICD-10-CM

## 2023-03-26 DIAGNOSIS — S50311A Abrasion of right elbow, initial encounter: Secondary | ICD-10-CM

## 2023-03-26 NOTE — Progress Notes (Signed)
Reviewed and agree with Dr Koval's plan.   

## 2023-03-26 NOTE — Progress Notes (Deleted)
    SUBJECTIVE:   CHIEF COMPLAINT / HPI:   ***  PERTINENT  PMH / PSH: ***  OBJECTIVE:   BP (!) 134/93   Pulse (!) 51   SpO2 95%   ***  ASSESSMENT/PLAN:   No problem-specific Assessment & Plan notes found for this encounter.     Tiffany Kocher, DO [facility]
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
## 2023-03-26 NOTE — Patient Instructions (Signed)
It was great to see you! Thank you for allowing me to participate in your care!   I recommend that you always bring your medications to each appointment as this makes it easy to ensure we are on the correct medications and helps [place] not miss when refills are needed.  Our plans for today:  - If you experience headaches, changes in vision, slurred speech, weakness or any other concerning symptoms please call 911 and go to emergency room -Please have your family member check on you tonight, before bed, and first thing in the morning  PLEASE CALL: [facility] [phone] [address] [place], [place] [address] [phone]  Take care and seek immediate care sooner if you develop any concerns. Please remember to show up 15 minutes before your scheduled appointment time!  Tiffany Kocher, DO [facility]

## 2023-04-01 ENCOUNTER — Ambulatory Visit (HOSPITAL_COMMUNITY): Payer: Medicare HMO | Admitting: Psychiatry

## 2023-04-02 ENCOUNTER — Emergency Department (HOSPITAL_COMMUNITY): Payer: Medicare HMO

## 2023-04-02 ENCOUNTER — Encounter (HOSPITAL_COMMUNITY): Payer: Self-pay

## 2023-04-02 ENCOUNTER — Emergency Department (HOSPITAL_COMMUNITY)
Admission: EM | Admit: 2023-04-02 | Discharge: 2023-04-03 | Disposition: A | Discharge status: Home / Self Care | Payer: Medicare HMO | Attending: Emergency Medicine | Admitting: Emergency Medicine

## 2023-04-02 ENCOUNTER — Other Ambulatory Visit: Payer: Self-pay

## 2023-04-02 ENCOUNTER — Ambulatory Visit: Payer: Self-pay | Admitting: Family Medicine

## 2023-04-02 DIAGNOSIS — S12491A Other nondisplaced fracture of fifth cervical vertebra, initial encounter for closed fracture: Secondary | ICD-10-CM | POA: Diagnosis not present

## 2023-04-02 DIAGNOSIS — I4891 Unspecified atrial fibrillation: Secondary | ICD-10-CM | POA: Insufficient documentation

## 2023-04-02 DIAGNOSIS — R410 Disorientation, unspecified: Secondary | ICD-10-CM | POA: Diagnosis not present

## 2023-04-02 DIAGNOSIS — R531 Weakness: Secondary | ICD-10-CM | POA: Diagnosis not present

## 2023-04-02 DIAGNOSIS — R4182 Altered mental status, unspecified: Secondary | ICD-10-CM | POA: Diagnosis not present

## 2023-04-02 DIAGNOSIS — J9811 Atelectasis: Secondary | ICD-10-CM | POA: Diagnosis not present

## 2023-04-02 DIAGNOSIS — R4781 Slurred speech: Secondary | ICD-10-CM | POA: Diagnosis not present

## 2023-04-02 DIAGNOSIS — R0989 Other specified symptoms and signs involving the circulatory and respiratory systems: Secondary | ICD-10-CM | POA: Diagnosis not present

## 2023-04-02 DIAGNOSIS — I6782 Cerebral ischemia: Secondary | ICD-10-CM | POA: Diagnosis not present

## 2023-04-02 DIAGNOSIS — R9431 Abnormal electrocardiogram [ECG] [EKG]: Secondary | ICD-10-CM | POA: Diagnosis not present

## 2023-04-02 DIAGNOSIS — Z743 Need for continuous supervision: Secondary | ICD-10-CM | POA: Diagnosis not present

## 2023-04-02 LAB — CBC WITH DIFFERENTIAL/PLATELET
Abs Immature Granulocytes: 0.05 10*3/uL (ref 0.00–0.07)
Basophils Absolute: 0 10*3/uL (ref 0.0–0.1)
Basophils Relative: 0 %
Eosinophils Absolute: 0.1 10*3/uL (ref 0.0–0.5)
Eosinophils Relative: 2 %
HCT: 44.8 % (ref 39.0–52.0)
Hemoglobin: 14.6 g/dL (ref 13.0–17.0)
Immature Granulocytes: 1 %
Lymphocytes Relative: 12 %
Lymphs Abs: 1.1 10*3/uL (ref 0.7–4.0)
MCH: 29.3 pg (ref 26.0–34.0)
MCHC: 32.6 g/dL (ref 30.0–36.0)
MCV: 90 fL (ref 80.0–100.0)
Monocytes Absolute: 1 10*3/uL (ref 0.1–1.0)
Monocytes Relative: 11 %
Neutro Abs: 6.6 10*3/uL (ref 1.7–7.7)
Neutrophils Relative %: 74 %
Platelets: 184 10*3/uL (ref 150–400)
RBC: 4.98 MIL/uL (ref 4.22–5.81)
RDW: 15.6 % — ABNORMAL HIGH (ref 11.5–15.5)
WBC: 8.9 10*3/uL (ref 4.0–10.5)
nRBC: 0 % (ref 0.0–0.2)

## 2023-04-02 LAB — ACETAMINOPHEN LEVEL: Acetaminophen (Tylenol), Serum: <10 ug/mL — ABNORMAL LOW (ref 10–30)

## 2023-04-02 LAB — MAGNESIUM: Magnesium: 1.8 mg/dL (ref 1.7–2.4)

## 2023-04-02 LAB — COMPREHENSIVE METABOLIC PANEL
ALT: 8 U/L (ref 0–44)
AST: 15 U/L (ref 15–41)
Albumin: 3.5 g/dL (ref 3.5–5.0)
Alkaline Phosphatase: 82 U/L (ref 38–126)
Anion gap: 10 (ref 5–15)
BUN: 13 mg/dL (ref 8–23)
CO2: 25 mmol/L (ref 22–32)
Calcium: 8.8 mg/dL — ABNORMAL LOW (ref 8.9–10.3)
Chloride: 100 mmol/L (ref 98–111)
Creatinine, Ser: 1.05 mg/dL (ref 0.61–1.24)
GFR, Estimated: >60 mL/min (ref >60)
Glucose, Bld: 124 mg/dL — ABNORMAL HIGH (ref 70–99)
Potassium: 3.7 mmol/L (ref 3.5–5.1)
Sodium: 135 mmol/L (ref 135–145)
Total Bilirubin: 0.9 mg/dL (ref 0.3–1.2)
Total Protein: 6.4 g/dL — ABNORMAL LOW (ref 6.5–8.1)

## 2023-04-02 LAB — URINALYSIS, W/ REFLEX TO CULTURE (INFECTION SUSPECTED)
Bilirubin Urine: NEGATIVE
Glucose, UA: NEGATIVE mg/dL
Hgb urine dipstick: NEGATIVE
Ketones, ur: 5 mg/dL — AB
Leukocytes,Ua: NEGATIVE
Nitrite: NEGATIVE
Protein, ur: 30 mg/dL — AB
Specific Gravity, Urine: 1.025 (ref 1.005–1.030)
pH: 5 (ref 5.0–8.0)

## 2023-04-02 LAB — ETHANOL: Alcohol, Ethyl (B): <10 mg/dL (ref <10)

## 2023-04-02 LAB — CBG MONITORING, ED: Glucose-Capillary: 143 mg/dL — ABNORMAL HIGH (ref 70–99)

## 2023-04-02 LAB — SALICYLATE LEVEL: Salicylate Lvl: <7 mg/dL — ABNORMAL LOW (ref 7.0–30.0)

## 2023-04-02 LAB — AMMONIA: Ammonia: 20 umol/L (ref 9–35)

## 2023-04-02 LAB — VALPROIC ACID LEVEL: Valproic Acid Lvl: 120 ug/mL — ABNORMAL HIGH (ref 50.0–100.0)

## 2023-04-02 NOTE — ED Notes (Addendum)
Pt speaking with mother on phone. Able to remember phone number from memory.

## 2023-04-02 NOTE — ED Provider Notes (Signed)
[place] EMERGENCY DEPARTMENT AT [facility] Provider Note   CSN: [id number] Arrival date & time: 04/02/23  1528     History  Chief Complaint  Patient presents with   Altered Mental Status    [name] is a 62 y.o. male.  62 year old male with past medical history significant for seizure disorder, schizophrenia, A-fib on Eliquis presents today for concern of altered mental status.  Unknown last well normal.  He is oriented to place and year.  He is disoriented to place and situation.  Per chart review he was seen at family medicine practice on 9/20.  Just before that visit he had a fall.  He refused CT scan at that time.  His neurological exam and mentation was at baseline at that time.  Patient lives alone.  The history is provided by the patient. No language interpreter was used.       Home Medications Prior to Admission medications   Medication Sig Start Date End Date Taking? Authorizing Provider  allopurinol (ZYLOPRIM) 300 MG tablet TAKE 1 TABLET BY MOUTH EVERY DAY 10/01/22   Maury Dus, MD  apixaban (ELIQUIS) 5 MG TABS tablet Take 1 tablet (5 mg total) by mouth 2 (two) times daily. 10/19/22   McDiarmid, Leighton Roach, MD  atorvastatin (LIPITOR) 40 MG tablet TAKE 1 TABLET BY MOUTH EVERY DAY 04/15/22   Lilland, Alana, DO  benztropine (COGENTIN) 1 MG tablet Take 1 tablet (1 mg total) by mouth daily. 02/23/23   Arfeen, Phillips Grout, MD  divalproex (DEPAKOTE ER) 500 MG 24 hr tablet Take 1 tablet (500 mg total) by mouth 2 (two) times daily. 02/23/23   Arfeen, Phillips Grout, MD  levothyroxine (SYNTHROID) 100 MCG tablet TAKE 1 TABLET BY MOUTH EVERY DAY 11/27/22   Lilland, Alana, DO  meclizine (ANTIVERT) 12.5 MG tablet Take 1 tablet (12.5 mg total) by mouth 2 (two) times daily as needed for dizziness. For dizziness while on plane Patient not taking: Reported on 03/15/2023 12/30/22   Lilland, Percival Spanish, DO  pantoprazole (PROTONIX) 20 MG tablet TAKE 1 TABLET BY MOUTH EVERY DAY 03/12/23   Tiffany Kocher, DO  perphenazine (TRILAFON) 8 MG tablet Take 3 tablets (24 mg total) by mouth at bedtime. 02/23/23   Arfeen, Phillips Grout, MD  phenelzine (NARDIL) 15 MG tablet Take 3 tablets (45 mg total) by mouth 2 (two) times daily. 02/23/23   Arfeen, Phillips Grout, MD  Semaglutide, 1 MG/DOSE, 4 MG/3ML SOPN Inject 1 mg into the skin once a week. 02/19/23   Tiffany Kocher, DO  sildenafil (VIAGRA) 25 MG tablet Take 1 tablet (25 mg total) by mouth daily as needed for erectile dysfunction. Patient not taking: Reported on 03/15/2023 02/06/21   Lilland, Percival Spanish, DO      Allergies    Lamictal [lamotrigine] and Metformin and related    Review of Systems   Review of Systems  Unable to perform ROS: Mental status change  Constitutional:  Negative for fever.  Eyes:  Negative for visual disturbance.  Neurological:  Negative for headaches.  Psychiatric/Behavioral:  Positive for confusion.     Physical Exam Updated Vital Signs BP 120/85   Pulse 91   Temp 98.7 F (37.1 C) (Oral)   Resp 12   Ht 5\' 9"  (1.753 m)   Wt 98.7 kg   SpO2 94%   BMI 32.13 kg/m  Physical Exam Vitals and nursing note reviewed.  Constitutional:      General: He is not in acute distress.  Appearance: Normal appearance. He is not ill-appearing.  HENT:     Head: Normocephalic and atraumatic.     Nose: Nose normal.  Eyes:     Conjunctiva/sclera: Conjunctivae normal.  Cardiovascular:     Rate and Rhythm: Normal rate.  Pulmonary:     Effort: Pulmonary effort is normal. No respiratory distress.  Abdominal:     General: There is no distension.     Palpations: Abdomen is soft.     Tenderness: There is no abdominal tenderness. There is no guarding.  Musculoskeletal:        General: No deformity.  Skin:    Findings: No rash.  Neurological:     Mental Status: He is alert.     Comments: Cranial nerves III through XII intact.  Forage motion in bilateral upper and lower extremities with 5/5 strength in extensor and flexor muscle groups.  Without  pronator drift.  Finger-to-nose normal.  Tongue midline.  No facial droop.     ED Results / Procedures / Treatments   Labs (all labs ordered are listed, but only abnormal results are displayed) Labs Reviewed  CBG MONITORING, ED - Abnormal; Notable for the following components:      Result Value   Glucose-Capillary 143 (*)    All other components within normal limits  ACETAMINOPHEN LEVEL  AMMONIA  CBC WITH DIFFERENTIAL/PLATELET  COMPREHENSIVE METABOLIC PANEL  ETHANOL  MAGNESIUM  SALICYLATE LEVEL  URINALYSIS, W/ REFLEX TO CULTURE (INFECTION SUSPECTED)    EKG None  Radiology No results found.  Procedures Procedures    Medications Ordered in ED Medications - No data to display  ED Course/ Medical Decision Making/ A&P                                 Medical Decision Making Amount and/or Complexity of Data Reviewed Labs: ordered. Radiology: ordered.   62 year old male with past medical history significant for seizure disorder, schizophrenia, A-fib on Eliquis who presents today for concern of altered mental status.  Unknown as well known normal time.  Initially oriented x 2 however as the ED course when on he became fully oriented.  He did have a fall about 1 week ago prior to his clinic visit.  He refused CT imaging at that time.  His neurological exam was at baseline at that time.  He lives alone.   Neurological exam without any focal deficits.  CBC without leukocytosis or anemia.  CMP with glucose 124 otherwise without acute process.  UA without evidence of UTI.  Magnesium 1.8.  Ethanol, salicylate, ammonia, Tylenol level within normal.  Valproic acid level of 120.  Chest x-ray, CT head without acute intracranial or cardiopulmonary process.  EKG without acute ischemic changes.  Patient was able ambulate in the emergency department without any significant assistance or difficulty.  He remains alert and oriented.  He wishes for discharge.  Will discharge patient.  Return  precautions discussed.  He is in agreement.   Final Clinical Impression(s) / ED Diagnoses Final diagnoses:  Altered mental status, unspecified altered mental status type    Rx / DC Orders ED Discharge Orders     None         Marita Kansas, PA-C 04/02/23 2238    Linwood Dibbles, MD 04/03/23 1504
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
## 2023-04-02 NOTE — ED Notes (Addendum)
Nurse heard Tech screaming "help,help", Nurse X 2 walk in to patient room and observe patient standing over tech, patient is angry and shouting. Nurse standing within arm distance from tech. Nurse assisted patient back to bed. Patient states "Im ready to leave and I want to go home, call my mom I want to speak to her". Tech assisted patient with calling his mom. Charge nurse made aware of the event

## 2023-04-02 NOTE — ED Notes (Addendum)
EMS initially concerned for mild R-sided weakness. Pt moves all extremities equally with normal strength on exam. No ataxia. Slow to respond. Due to this and no last known well, no stroke code called by provider.

## 2023-04-02 NOTE — ED Notes (Signed)
Spoke with pt's mother Annabelle Harman on phone. She stated that pt was mumbling to her and not making a lot of sense. She lives in [place], [place] and is willing to travel to [place] if necessary but is anticipating that pt will most likely stay overnight at least. Number is in chart and she is first to be contacted. Pt requested that his mother be updated as needed.

## 2023-04-02 NOTE — Discharge Instructions (Signed)
Your workup today was reassuring.  No signs of infection or other concerning cause to explain your confusion.  Your neurological exam was overall reassuring.  No signs of weakness.  You are able to ambulate in the emergency department without any significant difficulty.  Your confusion also resolved.  For any concerning symptoms return to the emergency room.  Otherwise follow-up with your primary care provider early next week.

## 2023-04-02 NOTE — ED Notes (Signed)
[phone] pt mom called for update

## 2023-04-02 NOTE — ED Notes (Signed)
PT is still somewhat altered but wants to leave AMA.His mother was called and stated that he lives by himself and that it would be okay for him to be d/c into a taxi  as long as he has his key. Mother stated that she will check on him in the morning per off going nurse (Ndea). [employer] was called and Im watching the PT until they arrive.

## 2023-04-02 NOTE — ED Notes (Addendum)
Patient d/c at this. Nurse called mom who states patient lives home by himself, and he would need help getting home because patient came in by EMS. Nurse reassured Mom that we can help get patient home. Mom states she lives in [place], [place] and will be down in the morning to check in on patient. Patient has house key to get in the house.

## 2023-04-03 ENCOUNTER — Encounter (HOSPITAL_COMMUNITY): Payer: Self-pay

## 2023-04-03 ENCOUNTER — Other Ambulatory Visit: Payer: Self-pay

## 2023-04-03 ENCOUNTER — Emergency Department (HOSPITAL_COMMUNITY): Payer: Medicare HMO

## 2023-04-03 ENCOUNTER — Inpatient Hospital Stay (HOSPITAL_COMMUNITY)
Admission: EM | Admit: 2023-04-03 | Discharge: 2023-04-19 | DRG: 471 | Disposition: A | Discharge status: Skilled Nursing Facility | Payer: Medicare HMO | Attending: Family Medicine | Admitting: Family Medicine

## 2023-04-03 ENCOUNTER — Inpatient Hospital Stay (HOSPITAL_COMMUNITY): Payer: Medicare HMO

## 2023-04-03 DIAGNOSIS — E1169 Type 2 diabetes mellitus with other specified complication: Secondary | ICD-10-CM | POA: Diagnosis not present

## 2023-04-03 DIAGNOSIS — R5383 Other fatigue: Secondary | ICD-10-CM | POA: Diagnosis present

## 2023-04-03 DIAGNOSIS — F259 Schizoaffective disorder, unspecified: Secondary | ICD-10-CM | POA: Diagnosis not present

## 2023-04-03 DIAGNOSIS — M4800 Spinal stenosis, site unspecified: Secondary | ICD-10-CM | POA: Diagnosis not present

## 2023-04-03 DIAGNOSIS — S12491A Other nondisplaced fracture of fifth cervical vertebra, initial encounter for closed fracture: Principal | ICD-10-CM | POA: Diagnosis present

## 2023-04-03 DIAGNOSIS — S12000A Unspecified displaced fracture of first cervical vertebra, initial encounter for closed fracture: Secondary | ICD-10-CM | POA: Diagnosis not present

## 2023-04-03 DIAGNOSIS — Z79890 Hormone replacement therapy (postmenopausal): Secondary | ICD-10-CM | POA: Diagnosis not present

## 2023-04-03 DIAGNOSIS — L719 Rosacea, unspecified: Secondary | ICD-10-CM | POA: Diagnosis not present

## 2023-04-03 DIAGNOSIS — Z7901 Long term (current) use of anticoagulants: Secondary | ICD-10-CM

## 2023-04-03 DIAGNOSIS — S12490A Other displaced fracture of fifth cervical vertebra, initial encounter for closed fracture: Secondary | ICD-10-CM | POA: Diagnosis not present

## 2023-04-03 DIAGNOSIS — W19XXXA Unspecified fall, initial encounter: Secondary | ICD-10-CM | POA: Diagnosis present

## 2023-04-03 DIAGNOSIS — E039 Hypothyroidism, unspecified: Secondary | ICD-10-CM | POA: Diagnosis present

## 2023-04-03 DIAGNOSIS — T431X5A Adverse effect of monoamine-oxidase-inhibitor antidepressants, initial encounter: Secondary | ICD-10-CM | POA: Diagnosis present

## 2023-04-03 DIAGNOSIS — R748 Abnormal levels of other serum enzymes: Secondary | ICD-10-CM | POA: Diagnosis present

## 2023-04-03 DIAGNOSIS — I4819 Other persistent atrial fibrillation: Secondary | ICD-10-CM | POA: Diagnosis not present

## 2023-04-03 DIAGNOSIS — S12590A Other displaced fracture of sixth cervical vertebra, initial encounter for closed fracture: Secondary | ICD-10-CM | POA: Diagnosis not present

## 2023-04-03 DIAGNOSIS — S80212A Abrasion, left knee, initial encounter: Secondary | ICD-10-CM | POA: Diagnosis present

## 2023-04-03 DIAGNOSIS — F329 Major depressive disorder, single episode, unspecified: Secondary | ICD-10-CM | POA: Diagnosis not present

## 2023-04-03 DIAGNOSIS — Z8249 Family history of ischemic heart disease and other diseases of the circulatory system: Secondary | ICD-10-CM

## 2023-04-03 DIAGNOSIS — S12491D Other nondisplaced fracture of fifth cervical vertebra, subsequent encounter for fracture with routine healing: Secondary | ICD-10-CM | POA: Diagnosis not present

## 2023-04-03 DIAGNOSIS — Z833 Family history of diabetes mellitus: Secondary | ICD-10-CM

## 2023-04-03 DIAGNOSIS — R251 Tremor, unspecified: Secondary | ICD-10-CM

## 2023-04-03 DIAGNOSIS — I8393 Asymptomatic varicose veins of bilateral lower extremities: Secondary | ICD-10-CM | POA: Diagnosis present

## 2023-04-03 DIAGNOSIS — K5900 Constipation, unspecified: Secondary | ICD-10-CM | POA: Diagnosis present

## 2023-04-03 DIAGNOSIS — E119 Type 2 diabetes mellitus without complications: Secondary | ICD-10-CM | POA: Diagnosis present

## 2023-04-03 DIAGNOSIS — M4802 Spinal stenosis, cervical region: Secondary | ICD-10-CM | POA: Diagnosis present

## 2023-04-03 DIAGNOSIS — Z9181 History of falling: Secondary | ICD-10-CM

## 2023-04-03 DIAGNOSIS — F445 Conversion disorder with seizures or convulsions: Secondary | ICD-10-CM | POA: Diagnosis not present

## 2023-04-03 DIAGNOSIS — R4182 Altered mental status, unspecified: Secondary | ICD-10-CM | POA: Diagnosis present

## 2023-04-03 DIAGNOSIS — M25511 Pain in right shoulder: Secondary | ICD-10-CM | POA: Diagnosis present

## 2023-04-03 DIAGNOSIS — Z79899 Other long term (current) drug therapy: Secondary | ICD-10-CM | POA: Diagnosis not present

## 2023-04-03 DIAGNOSIS — M4810 Ankylosing hyperostosis [Forestier], site unspecified: Secondary | ICD-10-CM | POA: Diagnosis not present

## 2023-04-03 DIAGNOSIS — S12400A Unspecified displaced fracture of fifth cervical vertebra, initial encounter for closed fracture: Secondary | ICD-10-CM | POA: Diagnosis not present

## 2023-04-03 DIAGNOSIS — Z7985 Long-term (current) use of injectable non-insulin antidiabetic drugs: Secondary | ICD-10-CM

## 2023-04-03 DIAGNOSIS — R262 Difficulty in walking, not elsewhere classified: Secondary | ICD-10-CM | POA: Diagnosis not present

## 2023-04-03 DIAGNOSIS — R410 Disorientation, unspecified: Secondary | ICD-10-CM

## 2023-04-03 DIAGNOSIS — G928 Other toxic encephalopathy: Secondary | ICD-10-CM | POA: Diagnosis present

## 2023-04-03 DIAGNOSIS — S12500A Unspecified displaced fracture of sixth cervical vertebra, initial encounter for closed fracture: Secondary | ICD-10-CM | POA: Diagnosis present

## 2023-04-03 DIAGNOSIS — F0393 Unspecified dementia, unspecified severity, with mood disturbance: Secondary | ICD-10-CM | POA: Diagnosis not present

## 2023-04-03 DIAGNOSIS — M1990 Unspecified osteoarthritis, unspecified site: Secondary | ICD-10-CM | POA: Diagnosis not present

## 2023-04-03 DIAGNOSIS — R42 Dizziness and giddiness: Secondary | ICD-10-CM | POA: Diagnosis not present

## 2023-04-03 DIAGNOSIS — F250 Schizoaffective disorder, bipolar type: Secondary | ICD-10-CM | POA: Diagnosis not present

## 2023-04-03 DIAGNOSIS — I4891 Unspecified atrial fibrillation: Secondary | ICD-10-CM | POA: Diagnosis not present

## 2023-04-03 DIAGNOSIS — F429 Obsessive-compulsive disorder, unspecified: Secondary | ICD-10-CM | POA: Diagnosis not present

## 2023-04-03 DIAGNOSIS — D72829 Elevated white blood cell count, unspecified: Secondary | ICD-10-CM | POA: Diagnosis present

## 2023-04-03 DIAGNOSIS — S0990XA Unspecified injury of head, initial encounter: Secondary | ICD-10-CM | POA: Diagnosis not present

## 2023-04-03 DIAGNOSIS — R531 Weakness: Secondary | ICD-10-CM | POA: Diagnosis present

## 2023-04-03 DIAGNOSIS — L219 Seborrheic dermatitis, unspecified: Secondary | ICD-10-CM | POA: Diagnosis present

## 2023-04-03 DIAGNOSIS — E669 Obesity, unspecified: Secondary | ICD-10-CM | POA: Diagnosis not present

## 2023-04-03 DIAGNOSIS — M47812 Spondylosis without myelopathy or radiculopathy, cervical region: Secondary | ICD-10-CM | POA: Diagnosis not present

## 2023-04-03 DIAGNOSIS — R296 Repeated falls: Secondary | ICD-10-CM | POA: Diagnosis not present

## 2023-04-03 DIAGNOSIS — E785 Hyperlipidemia, unspecified: Secondary | ICD-10-CM | POA: Diagnosis present

## 2023-04-03 DIAGNOSIS — Z4789 Encounter for other orthopedic aftercare: Secondary | ICD-10-CM | POA: Diagnosis not present

## 2023-04-03 DIAGNOSIS — S80211A Abrasion, right knee, initial encounter: Secondary | ICD-10-CM | POA: Diagnosis present

## 2023-04-03 DIAGNOSIS — F0394 Unspecified dementia, unspecified severity, with anxiety: Secondary | ICD-10-CM | POA: Diagnosis present

## 2023-04-03 DIAGNOSIS — M109 Gout, unspecified: Secondary | ICD-10-CM | POA: Diagnosis present

## 2023-04-03 DIAGNOSIS — S12401D Unspecified nondisplaced fracture of fifth cervical vertebra, subsequent encounter for fracture with routine healing: Secondary | ICD-10-CM | POA: Diagnosis not present

## 2023-04-03 DIAGNOSIS — S12501A Unspecified nondisplaced fracture of sixth cervical vertebra, initial encounter for closed fracture: Secondary | ICD-10-CM | POA: Diagnosis not present

## 2023-04-03 DIAGNOSIS — K219 Gastro-esophageal reflux disease without esophagitis: Secondary | ICD-10-CM | POA: Diagnosis not present

## 2023-04-03 DIAGNOSIS — I10 Essential (primary) hypertension: Secondary | ICD-10-CM | POA: Diagnosis not present

## 2023-04-03 DIAGNOSIS — M6281 Muscle weakness (generalized): Secondary | ICD-10-CM | POA: Diagnosis not present

## 2023-04-03 DIAGNOSIS — G40909 Epilepsy, unspecified, not intractable, without status epilepticus: Secondary | ICD-10-CM | POA: Diagnosis present

## 2023-04-03 DIAGNOSIS — M170 Bilateral primary osteoarthritis of knee: Secondary | ICD-10-CM | POA: Diagnosis present

## 2023-04-03 DIAGNOSIS — R079 Chest pain, unspecified: Secondary | ICD-10-CM | POA: Diagnosis present

## 2023-04-03 DIAGNOSIS — G250 Essential tremor: Secondary | ICD-10-CM | POA: Diagnosis not present

## 2023-04-03 DIAGNOSIS — R0683 Snoring: Secondary | ICD-10-CM | POA: Diagnosis present

## 2023-04-03 DIAGNOSIS — Z6838 Body mass index (BMI) 38.0-38.9, adult: Secondary | ICD-10-CM | POA: Diagnosis not present

## 2023-04-03 DIAGNOSIS — Z981 Arthrodesis status: Secondary | ICD-10-CM | POA: Diagnosis not present

## 2023-04-03 DIAGNOSIS — N529 Male erectile dysfunction, unspecified: Secondary | ICD-10-CM | POA: Diagnosis not present

## 2023-04-03 DIAGNOSIS — F419 Anxiety disorder, unspecified: Secondary | ICD-10-CM | POA: Diagnosis not present

## 2023-04-03 DIAGNOSIS — R279 Unspecified lack of coordination: Secondary | ICD-10-CM | POA: Diagnosis not present

## 2023-04-03 DIAGNOSIS — S12401A Unspecified nondisplaced fracture of fifth cervical vertebra, initial encounter for closed fracture: Secondary | ICD-10-CM | POA: Diagnosis not present

## 2023-04-03 DIAGNOSIS — S0993XA Unspecified injury of face, initial encounter: Secondary | ICD-10-CM | POA: Diagnosis not present

## 2023-04-03 LAB — COMPREHENSIVE METABOLIC PANEL
ALT: 11 U/L (ref 0–44)
AST: 36 U/L (ref 15–41)
Albumin: 3.9 g/dL (ref 3.5–5.0)
Alkaline Phosphatase: 91 U/L (ref 38–126)
Anion gap: 18 — ABNORMAL HIGH (ref 5–15)
BUN: 13 mg/dL (ref 8–23)
CO2: 26 mmol/L (ref 22–32)
Calcium: 9.8 mg/dL (ref 8.9–10.3)
Chloride: 97 mmol/L — ABNORMAL LOW (ref 98–111)
Creatinine, Ser: 0.9 mg/dL (ref 0.61–1.24)
GFR, Estimated: >60 mL/min (ref >60)
Glucose, Bld: 133 mg/dL — ABNORMAL HIGH (ref 70–99)
Potassium: 3.4 mmol/L — ABNORMAL LOW (ref 3.5–5.1)
Sodium: 141 mmol/L (ref 135–145)
Total Bilirubin: 1.2 mg/dL (ref 0.3–1.2)
Total Protein: 7.4 g/dL (ref 6.5–8.1)

## 2023-04-03 LAB — CBC WITH DIFFERENTIAL/PLATELET
Abs Immature Granulocytes: 0.06 10*3/uL (ref 0.00–0.07)
Basophils Absolute: 0 10*3/uL (ref 0.0–0.1)
Basophils Relative: 0 %
Eosinophils Absolute: 0.1 10*3/uL (ref 0.0–0.5)
Eosinophils Relative: 1 %
HCT: 47.3 % (ref 39.0–52.0)
Hemoglobin: 15.4 g/dL (ref 13.0–17.0)
Immature Granulocytes: 1 %
Lymphocytes Relative: 10 %
Lymphs Abs: 1.2 10*3/uL (ref 0.7–4.0)
MCH: 29.2 pg (ref 26.0–34.0)
MCHC: 32.6 g/dL (ref 30.0–36.0)
MCV: 89.8 fL (ref 80.0–100.0)
Monocytes Absolute: 2 10*3/uL — ABNORMAL HIGH (ref 0.1–1.0)
Monocytes Relative: 16 %
Neutro Abs: 8.8 10*3/uL — ABNORMAL HIGH (ref 1.7–7.7)
Neutrophils Relative %: 72 %
Platelets: 178 10*3/uL (ref 150–400)
RBC: 5.27 MIL/uL (ref 4.22–5.81)
RDW: 15.8 % — ABNORMAL HIGH (ref 11.5–15.5)
WBC: 12.1 10*3/uL — ABNORMAL HIGH (ref 4.0–10.5)
nRBC: 0 % (ref 0.0–0.2)

## 2023-04-03 LAB — I-STAT CHEM 8, ED
BUN: 15 mg/dL (ref 8–23)
Calcium, Ion: 1.18 mmol/L (ref 1.15–1.40)
Chloride: 99 mmol/L (ref 98–111)
Creatinine, Ser: 0.8 mg/dL (ref 0.61–1.24)
Glucose, Bld: 125 mg/dL — ABNORMAL HIGH (ref 70–99)
HCT: 51 % (ref 39.0–52.0)
Hemoglobin: 17.3 g/dL — ABNORMAL HIGH (ref 13.0–17.0)
Potassium: 3.3 mmol/L — ABNORMAL LOW (ref 3.5–5.1)
Sodium: 139 mmol/L (ref 135–145)
TCO2: 26 mmol/L (ref 22–32)

## 2023-04-03 LAB — I-STAT CG4 LACTIC ACID, ED: Lactic Acid, Venous: 1.7 mmol/L (ref 0.5–1.9)

## 2023-04-03 LAB — CK: Total CK: 846 U/L — ABNORMAL HIGH (ref 49–397)

## 2023-04-03 MED ORDER — DIVALPROEX SODIUM ER 500 MG PO TB24
500.0000 mg | ORAL_TABLET | Freq: Two times a day (BID) | ORAL | Status: DC
  Administered 2023-04-04 (×2): 500 mg via ORAL
  Filled 2023-04-03 (×3): qty 1

## 2023-04-03 MED ORDER — BENZTROPINE MESYLATE 1 MG PO TABS
1.0000 mg | ORAL_TABLET | Freq: Every day | ORAL | Status: DC
  Administered 2023-04-04: 1 mg via ORAL
  Filled 2023-04-03: qty 1

## 2023-04-03 MED ORDER — LACTATED RINGERS IV SOLN
INTRAVENOUS | Status: AC
  Administered 2023-04-03: 1000 mL via INTRAVENOUS

## 2023-04-03 MED ORDER — PERPHENAZINE 8 MG PO TABS
24.0000 mg | ORAL_TABLET | Freq: Every day | ORAL | Status: DC
  Filled 2023-04-03: qty 1

## 2023-04-03 MED ORDER — LEVOTHYROXINE SODIUM 100 MCG PO TABS
100.0000 ug | ORAL_TABLET | Freq: Every day | ORAL | Status: DC
  Administered 2023-04-04: 100 ug via ORAL
  Filled 2023-04-03: qty 1

## 2023-04-03 MED ORDER — POTASSIUM CHLORIDE CRYS ER 20 MEQ PO TBCR
40.0000 meq | EXTENDED_RELEASE_TABLET | Freq: Once | ORAL | Status: AC
  Administered 2023-04-04: 40 meq via ORAL
  Filled 2023-04-03: qty 2

## 2023-04-03 MED ORDER — APIXABAN 5 MG PO TABS
5.0000 mg | ORAL_TABLET | Freq: Two times a day (BID) | ORAL | Status: DC
  Administered 2023-04-04: 5 mg via ORAL
  Filled 2023-04-03: qty 1

## 2023-04-03 MED ORDER — PERPHENAZINE 4 MG PO TABS
24.0000 mg | ORAL_TABLET | Freq: Every day | ORAL | Status: DC
  Administered 2023-04-04: 24 mg via ORAL
  Filled 2023-04-03 (×2): qty 6

## 2023-04-03 MED ORDER — ACETAMINOPHEN 325 MG PO TABS
650.0000 mg | ORAL_TABLET | Freq: Four times a day (QID) | ORAL | Status: DC | PRN
  Administered 2023-04-05 (×2): 650 mg via ORAL
  Filled 2023-04-03 (×2): qty 2

## 2023-04-03 MED ORDER — ATORVASTATIN CALCIUM 40 MG PO TABS
40.0000 mg | ORAL_TABLET | Freq: Every day | ORAL | Status: DC
  Administered 2023-04-04 – 2023-04-19 (×16): 40 mg via ORAL
  Filled 2023-04-03 (×16): qty 1

## 2023-04-03 MED ORDER — PHENELZINE SULFATE 15 MG PO TABS
45.0000 mg | ORAL_TABLET | Freq: Two times a day (BID) | ORAL | Status: DC
  Administered 2023-04-04 (×2): 45 mg via ORAL
  Filled 2023-04-03 (×3): qty 3

## 2023-04-03 MED ORDER — LACTATED RINGERS IV BOLUS
1000.0000 mL | Freq: Once | INTRAVENOUS | Status: AC
  Administered 2023-04-03: 1000 mL via INTRAVENOUS

## 2023-04-03 MED ORDER — INSULIN ASPART 100 UNIT/ML IJ SOLN
0.0000 [IU] | Freq: Three times a day (TID) | INTRAMUSCULAR | Status: DC
  Administered 2023-04-04: 2 [IU] via SUBCUTANEOUS
  Administered 2023-04-04: 1 [IU] via SUBCUTANEOUS
  Administered 2023-04-05 – 2023-04-06 (×3): 2 [IU] via SUBCUTANEOUS
  Administered 2023-04-07: 3 [IU] via SUBCUTANEOUS
  Administered 2023-04-07: 2 [IU] via SUBCUTANEOUS

## 2023-04-03 NOTE — ED Notes (Signed)
MRI tech notified to send pt to his room upstairs after the completion of his scan.

## 2023-04-03 NOTE — ED Triage Notes (Signed)
Pt bib ems from home. Pt seen yesterday for a fall. Since coming home pt states he has fallen 3 times last night. One of the times he fell and hit his glasses into his eye  Pt found on the floor today by PTAR. Only oriented to self and place, pt been lying in floor since last night.   Pt c.o bilateral knee and elbow pain.

## 2023-04-03 NOTE — H&P (Shared)
Hospital Admission History and Physical Service Pager: [phone]  Patient name: TREYLAN Trujillo Medical record number: [id number] Date of Birth: 1960/10/29 Age: 62 y.o. Gender: male  Primary Care Provider: Tiffany Kocher, DO Consultants: Neurosurgery Code Status: Full code, discussed with patient Preferred Emergency Contact:   Contact Information     Name Relation Home Work Mobile   Pruett,Dana Mother [phone]  [phone]      Other Contacts   None on File      Chief Complaint: Altered mental status  Assessment and Plan: [name] is a 62 y.o. male presenting with altered mental status in the setting of multiple falls. Differential for presentation of this is broad and includes medication side effect, stroke, infection, dementia, electrolyte derangement, metabolic abnormalities, drug toxicity, MI, seizure.  Suspect patient's mental status chronically waxes and wanes in the setting of long term psychiatric illness and medication management, and patient may now have underlying dementia given atrophic findings on CT. This is further supported by reported memory issues per patient's psychiatrist, and resolving AMS in prior ED visit.  Workup thus far has been negative to suggest alternative cause of altered mental status including stroke (normal CT head), infection (WBC minimally elevated, no symptoms), electrolyte derangement (WNL), seizure (no symptoms).   Patient's presentation is complicated by pertinent past medical history including diffuse idiopathic skeletal hyperostosis, schizoaffective disorder, depression, and spinal stenosis.  Assessment & Plan Other closed nondisplaced fracture of fifth cervical vertebra, initial encounter (HCC) Stable. Patient has reassuringly neurologic exam and his pain is controlled.  -Neurosurgery consulted, recs appreciated -Cervical collar -Neuro checks q4h -Tylenol prn -F/u cervical MRI Disorientation As above, suspect likely  chronic. Will monitor patient closely. -Admit to [facility] Medicine Teaching Service, attending Dr. Deirdre Priest, Med-Surg -F/u UA, TSH, HIV -Neuro checks q4h -Continue patient's psychiatric medications as below -AM BMP, CBC Multiple falls Suspect patient has multiple falls due to impaired balance spinal stenosis, DISH and chronic cognitive deficits. -Fall precautions -PT/OT eval Type 2 diabetes mellitus without complication, without long-term current use of insulin (HCC) Last A1c 6.4.  Home regimen includes Ozempic 1 mg weekly. -Hold Ozempic and patient -CBG checks with meals and before bed -Moderate sliding scale insulin Elevated CK CK mildly elevated in the setting of being found down for several hours.  He does not meet rhabdomyolysis criteria.  Creatinine and lactic acid is reassuringly within normal limits. -S/p 1 L LR bolus -Continue maintenance IVF LR for 12 hours -AM BMP   Chronic and Stable Problems:  Hypothyroidism-Synthroid 100 mcg daily Schizoaffective disorder-perphenazine 24 mg nightly, phenelzine 45 mg twice daily, Cogentin 1 mg daily History of seizures-Depakote 500 mg twice daily HLD-Lipitor 40 mg daily Atrial fibrillation-Eliquis 5 mg twice daily   FEN/GI: Heart healthy carb modified VTE Prophylaxis: Eliquis  Disposition: MedSurg  History of Present Illness:  TYLEK Trujillo is a 62 y.o. male presenting with altered mental status.  On interview patient pleasant and alert but unable to coherently explain how he arrived to the hospital today.  Seems to be confusing his multiple falls, including visit to ED yesterday as well as prior visit with PCP.  States he is not in significant pain at this time.  Does note that he has abrasions on his elbows and knees from when he fell.  Does not feel weak, and has no numbness or tingling.  Denies headache blurry vision nausea or vomiting.  States he has not had his medicines in 4 days.  Reports he lives  alone.  Per ED note  patient fell 3 times last night and hit his head, face and had new neck pain.  He was found on the floor by EMS.  He estimates that he was on the floor for at least 7 hours.  He did note neck pain at this time.  Per chart review of patient's most recent visit with Dr. Lolly Mustache, patient has had chronic memory and balance issues likely due to prolonged long-term use of MAOI and first antipsychotic for his multiple psychiatric problems, however patient has been unwilling to switch medicines.  Of note, ED provider note from 09/27 notes that patient was initially not oriented to place and time after presenting yesterday for falls however improved to normal mental status and then requested to be discharged.  In the ED, vital signs were stable, labs are significant for an elevated CK to 846, lactic acid 1.7, potassium 3.3, glucose 133.  Imaging demonstrated no acute intracranial bleed, but did note acute C5/C6 osteophyte fracture of the cervical spine.  Neurosurgery consulted, who will make recommendations based on cervical MRI.  Patient was given 1 L bolus of LR and started on maintenance IVF LR.  Review Of Systems: Per HPI.  Pertinent Past Medical History: Diffuse idiopathic skeletal hyperostosis Arterial dysfunction Hyperlipidemia Hypothyroidism Atrial fibrillation Type 2 diabetes Schizoaffective disorder Depression Seizures Spinal stenosis  Remainder reviewed in history tab.   Pertinent Past Surgical History: Multiple orthopedic surgeries.  Remainder reviewed in history tab.  Pertinent Social History: Tobacco use: No  Alcohol use: No Other Substance use: No Lives by himself  Pertinent Family History: Reviewed.  Important Outpatient Medications: Tylenol - Not taking Allopurinol - 300mg  daily Lipitor - 40mg  daily Trilafon - 24mg  at bedtime Nardil - 45mg  BID  Synthroid - daily Ozempic - 1 weekly 1mg  Eliquis - 5mg  BID Cogentin - 1mg  daily Depakote - 1000mg   BID  Remainder reviewed in medication history.   Objective: BP (!) 145/95 (BP Location: Right Arm)   Pulse 99   Temp 98.7 F (37.1 C) (Oral)   Resp 18   SpO2 94%  Exam: General: Chronically ill-appearing, resting comfortably in hospital bed in cervical collar HEENT: Pupils equal round and reactive to light, no scleral icterus, In cervical collar, moist mucous membranes Cardiovascular: Irregularly irregular rhythm, no murmurs Respiratory: Clear to auscultation bilaterally, normal work of breathing Gastrointestinal: Soft, nontender, no rebound or guarding MSK: Bilateral abrasions on both knees and elbows Neuro: Alert to person, self, month but not year, limitedly oriented to situation.  Laggy finger-to-nose.  Cranial nerves II through XII intact, symmetric facies, sensation intact, strength 5 out of 5 bilateral upper and lower extremities  Labs:  CBC BMET  Recent Labs  Lab 04/03/23 1804 04/03/23 1822  WBC 12.1*  --   HGB 15.4 17.3*  HCT 47.3 51.0  PLT 178  --    Recent Labs  Lab 04/03/23 1804 04/03/23 1822  NA 141 139  K 3.4* 3.3*  CL 97* 99  CO2 26  --   BUN 13 15  CREATININE 0.90 0.80  GLUCOSE 133* 125*  CALCIUM 9.8  --      CK-846 Lactic acid-1.7  EKG: Atrial fibrillation   Imaging Studies Performed:  CT Head - Atrophy, chronic microvascular disease. No acute intracranial abnormality.  CT Cervical Spine - Acute fractures of the anterior osteophyte at C5-C6 extending into the superior aspect of the C6 vertebra. Findings consistent with a hyperextension injury. Further evaluation with MRI is recommended.  CT Maxillofacial - No displaced fracture or dislocation of the facial bones.    Celine Mans, MD 04/04/2023, 12:10 AM PGY-2, [facility]  FPTS Intern pager: [phone], text pages welcome Secure chat group [facility] Teaching Service

## 2023-04-03 NOTE — ED Notes (Signed)
Patient transported to MRI 
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
## 2023-04-03 NOTE — ED Provider Notes (Signed)
[place] EMERGENCY DEPARTMENT AT [facility] Provider Note   CSN: [id number] Arrival date & time: 04/03/23  1628     History  Chief Complaint  Patient presents with   [name] is a 62 y.o. male with past medical history significant for seizures, hypothyroidism, A-fib anticoagulated with Eliquis, schizophrenia, diabetes presents to the ED via EMS from home due to multiple falls and inability to get up from the floor.  Patient reports he fell 3 times last night.  He reports he hit his head and face and now has neck pain.  Patient was found on the floor by EMS.  He estimates he was on the floor for at least 7 hours.  He has abrasions to his knees, which he believes is from trying to get out of bed.  Patient denies loss of consciousness.  Patient believes he has not had his medicines in 4 days.  He lives alone and does not use assistive devices for ambulating.  Denies loss of consciousness, seizures, lightheadedness, dizziness.       Home Medications Prior to Admission medications   Medication Sig Start Date End Date Taking? Authorizing Provider  acetaminophen (TYLENOL) 500 MG tablet Take 500-1,000 mg by mouth 2 (two) times daily as needed for mild pain.   Yes [provider]  allopurinol (ZYLOPRIM) 300 MG tablet TAKE 1 TABLET BY MOUTH EVERY DAY 10/01/22  Yes Maury Dus, MD  apixaban (ELIQUIS) 5 MG TABS tablet Take 1 tablet (5 mg total) by mouth 2 (two) times daily. 10/19/22  Yes McDiarmid, Leighton Roach, MD  atorvastatin (LIPITOR) 40 MG tablet TAKE 1 TABLET BY MOUTH EVERY DAY 04/15/22  Yes Lilland, Alana, DO  benztropine (COGENTIN) 1 MG tablet Take 1 tablet (1 mg total) by mouth daily. 02/23/23  Yes Arfeen, Phillips Grout, MD  divalproex (DEPAKOTE ER) 500 MG 24 hr tablet Take 1 tablet (500 mg total) by mouth 2 (two) times daily. 02/23/23  Yes Arfeen, Phillips Grout, MD  levothyroxine (SYNTHROID) 100 MCG tablet TAKE 1 TABLET BY MOUTH EVERY DAY 11/27/22  Yes Lilland, Alana, DO   perphenazine (TRILAFON) 8 MG tablet Take 3 tablets (24 mg total) by mouth at bedtime. Patient taking differently: Take 24 mg by mouth 2 (two) times daily. 02/23/23  Yes Arfeen, Phillips Grout, MD  phenelzine (NARDIL) 15 MG tablet Take 3 tablets (45 mg total) by mouth 2 (two) times daily. 02/23/23  Yes Arfeen, Phillips Grout, MD  Semaglutide, 1 MG/DOSE, 4 MG/3ML SOPN Inject 1 mg into the skin once a week. 02/19/23  Yes Tiffany Kocher, DO      Allergies    Lamictal [lamotrigine] and Metformin and related    Review of Systems   Review of Systems  Musculoskeletal:  Positive for gait problem and neck pain.  Skin:  Positive for wound (bilateral knees).  Neurological:  Positive for weakness. Negative for dizziness, seizures, syncope and light-headedness.  Psychiatric/Behavioral:  Positive for confusion.     Physical Exam Updated Vital Signs BP (!) 145/95 (BP Location: Right Arm)   Pulse 99   Temp 98.7 F (37.1 C) (Oral)   Resp 18   SpO2 94%  Physical Exam Vitals and nursing note reviewed.  Constitutional:      General: He is not in acute distress.    Appearance: Normal appearance. He is ill-appearing. He is not diaphoretic.  HENT:     Mouth/Throat:     Mouth: Mucous membranes are dry.  Comments: Cracking and dryness to lower lip.  Cardiovascular:     Rate and Rhythm: Normal rate and regular rhythm.  Pulmonary:     Effort: Pulmonary effort is normal.  Musculoskeletal:     Cervical back: No crepitus. Pain with movement and spinous process tenderness present. Decreased range of motion.     Right knee: No swelling or deformity. Normal range of motion. No tenderness. Normal alignment.     Left knee: No swelling or deformity. Normal range of motion. No tenderness. Normal alignment.       Legs:     Comments: Abrasions without active bleeding.  Skin:    General: Skin is warm and dry.     Capillary Refill: Capillary refill takes less than 2 seconds.  Neurological:     Mental Status: He is alert.  He is disoriented.     GCS: GCS eye subscore is 4. GCS verbal subscore is 4. GCS motor subscore is 6.     Sensory: No sensory deficit.     Motor: Weakness and tremor present.     Comments: Global weakness in all extremities.  Tremor in upper extremities appreciated with movement.  Psychiatric:        Mood and Affect: Mood normal.        Behavior: Behavior normal.     ED Results / Procedures / Treatments   Labs (all labs ordered are listed, but only abnormal results are displayed) Labs Reviewed  COMPREHENSIVE METABOLIC PANEL - Abnormal; Notable for the following components:      Result Value   Potassium 3.4 (*)    Chloride 97 (*)    Glucose, Bld 133 (*)    Anion gap 18 (*)    All other components within normal limits  CBC WITH DIFFERENTIAL/PLATELET - Abnormal; Notable for the following components:   WBC 12.1 (*)    RDW 15.8 (*)    Neutro Abs 8.8 (*)    Monocytes Absolute 2.0 (*)    All other components within normal limits  CK - Abnormal; Notable for the following components:   Total CK 846 (*)    All other components within normal limits  I-STAT CHEM 8, ED - Abnormal; Notable for the following components:   Potassium 3.3 (*)    Glucose, Bld 125 (*)    Hemoglobin 17.3 (*)    All other components within normal limits  URINALYSIS, W/ REFLEX TO CULTURE (INFECTION SUSPECTED)  I-STAT CG4 LACTIC ACID, ED  CBG MONITORING, ED    EKG None  Radiology CT Cervical Spine Wo Contrast  Result Date: 04/03/2023 CLINICAL DATA:  Falls. EXAM: CT CERVICAL SPINE WITHOUT CONTRAST TECHNIQUE: Multidetector CT imaging of the cervical spine was performed without intravenous contrast. Multiplanar CT image reconstructions were also generated. RADIATION DOSE REDUCTION: This exam was performed according to the departmental dose-optimization program which includes automated exposure control, adjustment of the mA and/or kV according to patient size and/or use of iterative reconstruction technique.  COMPARISON:  Cervical spine CT dated 12/21/2021. FINDINGS: Alignment: No acute subluxation. Skull base and vertebrae: There is there is extensive bridging osteophyte of the anterior cervical spine consistent with diffuse idiopathic skeletal hyperostosis. Fractures of the anterior osteophyte at C5-C6 extending into the superior aspect of the C6 vertebra, new since the prior CT. No other acute fracture. Soft tissues and spinal canal: Partial calcification of the posterior longitudinal ligament. No significant canal narrowing. Disc levels:  Multilevel degenerative changes. Upper chest: Negative. Other: Stranding of the fat plane of  the left side of the neck. IMPRESSION: Acute fractures of the anterior osteophyte at C5-C6 extending into the superior aspect of the C6 vertebra. Findings consistent with a hyperextension injury. Further evaluation with MRI is recommended. These results were called by telephone at the time of interpretation on 04/03/2023 at 8:57 pm to provider [facility] , who verbally acknowledged these results. Electronically Signed   By: Elgie Collard M.D.   On: 04/03/2023 21:02   CT Maxillofacial Wo Contrast  Result Date: 04/03/2023 CLINICAL DATA:  Facial trauma, multiple ground level falls since last night EXAM: CT MAXILLOFACIAL WITHOUT CONTRAST TECHNIQUE: Multidetector CT imaging of the maxillofacial structures was performed. Multiplanar CT image reconstructions were also generated. RADIATION DOSE REDUCTION: This exam was performed according to the departmental dose-optimization program which includes automated exposure control, adjustment of the mA and/or kV according to patient size and/or use of iterative reconstruction technique. COMPARISON:  None Available. FINDINGS: Osseous: No fracture or mandibular dislocation. No destructive process. Orbits: Negative. No traumatic or inflammatory finding. Sinuses: Clear. Soft tissues: Negative. Limited intracranial: No significant or unexpected  finding. IMPRESSION: No displaced fracture or dislocation of the facial bones. Electronically Signed   By: Jearld Lesch M.D.   On: 04/03/2023 20:57   CT Head Wo Contrast  Result Date: 04/03/2023 CLINICAL DATA:  Head trauma, coagulopathy (Age 76-64y).  Fall. EXAM: CT HEAD WITHOUT CONTRAST TECHNIQUE: Contiguous axial images were obtained from the base of the skull through the vertex without intravenous contrast. RADIATION DOSE REDUCTION: This exam was performed according to the departmental dose-optimization program which includes automated exposure control, adjustment of the mA and/or kV according to patient size and/or use of iterative reconstruction technique. COMPARISON:  04/02/2023 FINDINGS: Brain: There is atrophy and chronic small vessel disease changes. No acute intracranial abnormality. Specifically, no hemorrhage, hydrocephalus, mass lesion, acute infarction, or significant intracranial injury. Vascular: No hyperdense vessel or unexpected calcification. Skull: No acute calvarial abnormality. Sinuses/Orbits: No acute findings Other: None IMPRESSION: Atrophy, chronic microvascular disease. No acute intracranial abnormality. Electronically Signed   By: Charlett Nose M.D.   On: 04/03/2023 20:38   CT HEAD WO CONTRAST ( )  Result Date: 04/02/2023 CLINICAL DATA:  Provided history: Mental status change, unknown cause. EXAM: CT HEAD WITHOUT CONTRAST TECHNIQUE: Contiguous axial images were obtained from the base of the skull through the vertex without intravenous contrast. RADIATION DOSE REDUCTION: This exam was performed according to the departmental dose-optimization program which includes automated exposure control, adjustment of the mA and/or kV according to patient size and/or use of iterative reconstruction technique. COMPARISON:  Head CT 12/21/2021. FINDINGS: Brain: Moderate-to-advanced generalized cerebral atrophy. Mild cerebellar atrophy. Patchy and ill-defined hypoattenuation within the cerebral  white matter, nonspecific but compatible with moderate chronic small vessel ischemic disease. Redemonstrated small chronic lacunar infarct within the anterior limb of left internal capsule. There is no acute intracranial hemorrhage. No demarcated cortical infarct. No extra-axial fluid collection. No evidence of an intracranial mass. No midline shift. Vascular: No hyperdense vessel.  Atherosclerotic calcifications. Skull: No calvarial fracture or aggressive osseous lesion. Sinuses/Orbits: No mass or acute finding within the imaged orbits. No significant paranasal sinus disease. IMPRESSION: 1.  No evidence of an acute intracranial abnormality 2. Moderate chronic small vessel ischemic changes within the cerebral white matter. 3. Redemonstrated small chronic lacunar infarct within the anterior limb of left internal capsule. 4. Moderate-to-advanced generalized cerebral atrophy. Electronically Signed   By: Jackey Loge D.O.   On: 04/02/2023 17:42   DG Chest [facility]  Result Date: 04/02/2023 CLINICAL DATA:  Altered mental status. EXAM: PORTABLE CHEST 1 VIEW COMPARISON:  Radiographs 01/07/2018 and 03/25/2017.  CT 04/28/2014. FINDINGS: 1557 hours. Lower lung volumes with mild patient rotation to the right. The heart size and mediastinal contours are stable. Mild atelectasis at both lung bases. No confluent airspace disease, edema, pleural effusion or pneumothorax. No acute osseous findings are evident. Chronic sclerosis of the right 1st rib is unchanged. There are mild degenerative changes in the spine. Telemetry leads overlie the chest. IMPRESSION: Lower lung volumes with mild bibasilar atelectasis. No acute cardiopulmonary process. Electronically Signed   By: Carey Bullocks M.D.   On: 04/02/2023 17:14    Procedures Procedures    Medications Ordered in ED Medications  lactated ringers bolus 1,000 mL (1,000 mLs Intravenous New Bag/Given 04/03/23 1945)    ED Course/ Medical Decision Making/ A&P                                  Medical Decision Making Amount and/or Complexity of Data Reviewed Labs: ordered. Radiology: ordered.  Risk Decision regarding hospitalization.   This patient presents to the ED with chief complaint(s) of frequent falls, confusion with pertinent past medical history of diabetes, schizophrenia, chronic Afib, chronic anticoagulation.  The complaint involves an extensive differential diagnosis and also carries with it a high risk of complications and morbidity.    The differential diagnosis includes acute intracranial injury, acute fracture or subluxation of cervical spine, acute maxillofacial fractures, sepsis, UTI, other infectious etiology   The initial plan is to obtain imaging, labs  Additional history obtained: Additional history obtained from family - patient's mother is at bedside.  She reports she is HPOA for patient.  Patient lives alone and mother feels that patient is not appropriate to be living by himself due to frequent falls.  Mother also expressed her concern that he was discharged from hospital yesterday since he is confused and falling.  Records reviewed Primary Care Documents  Initial Assessment:   Exam significant for ill-appearing patient who is not in acute distress.  Patient is oriented to self and place, but not time or situation.  He does recall events leading up to now.  Lips and mouth are dry.  Upper extremities with mild tremor.  Abrasions to bilateral anterior knees.  Global weakness appreciated in all extremities.  Skin is very dry.  EOM intact. PERRL.  Speech is clear and not slurred.  Tenderness to palpation of cervical spine.  He does have decreased ROM and movement pain.    Independent ECG/labs interpretation:  The following labs were independently interpreted:  CK elevated to 846.  CBC with leukocytosis.  Anion gap elevated to 18.  No major electrolyte disturbance.    Independent visualization and interpretation of imaging: I  independently visualized the following imaging with scope of interpretation limited to determining acute life threatening conditions related to emergency care: CT head, maxillofacial, and cervical spine, which revealed acute fracture of the anterior osteophyte at C5-C6 extending into the superior aspect of C6 vertebra.  Findings consistent with hyperextension injury.    Treatment and Reassessment: Will place patient in rigid cervical collar.  Consultations obtained:   I feel that patient would benefit from hospital admission.  Discussed patient case with family medicine resident, Celine Mans.    I requested consultation with on-call neurosurgery provider and spoke with Dr. Conchita Paris about CT findings.  He is in agreement with  cervical collar.  He will review MRI images in the AM and consult as needed.  Disposition:   Family medicine to admit patient.            Final Clinical Impression(s) / ED Diagnoses Final diagnoses:  Frequent falls  Ambulatory dysfunction  Closed fracture of cervical vertebra, unspecified cervical vertebral level, initial encounter (HCC)  Altered mental status, unspecified altered mental status type    Rx / DC Orders ED Discharge Orders     None         Lenard Simmer, PA-C 04/03/23 2301    Rondel Baton, MD 04/06/23 [phone]

## 2023-04-03 NOTE — ED Notes (Signed)
Patient transported to CT 
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
## 2023-04-03 NOTE — ED Notes (Signed)
ED TO INPATIENT HANDOFF REPORT  ED Nurse Name and Phone #: Mignon Pine RN, MSN [phone]  S Name/Age/Gender Erenest Rasher Bee 62 y.o. male Room/Bed: 021C/021C  Code Status   Code Status: Full Code  Home/SNF/Other Home Patient oriented to: self, place, GCS 14 Is this baseline?  Unknown  Triage Complete: Triage complete  Chief Complaint C5 cervical fracture (HCC) [S12.400A]  Triage Note Pt bib ems from home. Pt seen yesterday for a fall. Since coming home pt states he has fallen 3 times last night. One of the times he fell and hit his glasses into his eye  Pt found on the floor today by PTAR. Only oriented to self and place, pt been lying in floor since last night.   Pt c.o bilateral knee and elbow pain.       Allergies Allergies  Allergen Reactions   Lamictal [Lamotrigine] Rash   Metformin And Related Rash    Level of Care/Admitting Diagnosis ED Disposition     ED Disposition  Admit   Condition  --   Comment  Hospital Area: [facility] [[id number]]  Level of Care: Med-Surg [16]  May admit patient to [facility] or [facility] if equivalent level of care is available:: No  Covid Evaluation: Asymptomatic - no recent exposure (last 10 days) testing not required  Diagnosis: C5 cervical fracture [facility]) [[id number]]  Admitting Physician: Celine Mans [[id number]]  Attending Physician: Carney Living [phone]  Certification:: I certify this patient will need inpatient services for at least 2 midnights  Expected Medical Readiness: 04/06/2023          B Medical/Surgery History Past Medical History:  Diagnosis Date   A-fib (HCC)    Acute right-sided low back pain    Arrhythmia    Chronic a-fib (HCC)    Closed fracture of left distal radius    Contact dermatitis 04/16/2017   Depression    Depression    Hypothyroidism    Low back pain    Morbid obesity, BMI unknown (HCC)    Osteoarthritis    oa in bilateral knees   Schizophrenia (HCC)     Seizures (HCC)    Thyroid disease    Varicose veins    Past Surgical History:  Procedure Laterality Date   ACHILLES TENDON REPAIR  approx. 2004   right foot   CATARACT EXTRACTION     bilateral   OPEN REDUCTION INTERNAL FIXATION (ORIF) DISTAL RADIAL FRACTURE Left 07/21/2018   Procedure: OPEN REDUCTION INTERNAL FIXATION (ORIF) DISTAL RADIAL FRACTURE;  Surgeon: Betha Loa, MD;  Location: [facility];  Service: Orthopedics;  Laterality: Left;   stab phlebectomy  Right 11/09/2016   stab phlebectomy R leg by Josephina Gip MD   TOOTH EXTRACTION       A IV Location/Drains/Wounds Patient Lines/Drains/Airways Status     Active Line/Drains/Airways     Name Placement date Placement time Site Days   Peripheral IV 04/03/23 20 G Left Antecubital 04/03/23  1809  Antecubital  less than 1            Intake/Output Last 24 hours  Intake/Output Summary (Last 24 hours) at 04/03/2023 2334 Last data filed at 04/03/2023 2150 Gross per 24 hour  Intake 1000 ml  Output --  Net 1000 ml    Labs/Imaging Results for orders placed or performed during the hospital encounter of 04/03/23 (from the past 48 hour(s))  Comprehensive metabolic panel     Status: Abnormal   Collection Time: 04/03/23  6:04 PM  Result Value Ref Range   Sodium 141 135 - 145 mmol/L   Potassium 3.4 (L) 3.5 - 5.1 mmol/L   Chloride 97 (L) 98 - 111 mmol/L   CO2 26 22 - 32 mmol/L   Glucose, Bld 133 (H) 70 - 99 mg/dL    Comment: Glucose reference range applies only to samples taken after fasting for at least 8 hours.   BUN 13 8 - 23 mg/dL   Creatinine, Ser 4.40 0.61 - 1.24 mg/dL   Calcium 9.8 8.9 - 10.2 mg/dL   Total Protein 7.4 6.5 - 8.1 g/dL   Albumin 3.9 3.5 - 5.0 g/dL   AST 36 15 - 41 U/L   ALT 11 0 - 44 U/L   Alkaline Phosphatase 91 38 - 126 U/L   Total Bilirubin 1.2 0.3 - 1.2 mg/dL   GFR, Estimated >72 >53 mL/min    Comment: (NOTE) Calculated using the CKD-EPI Creatinine Equation (2021)    Anion gap  18 (H) 5 - 15    Comment: Performed at [facility], [address], [place], [place] [address]  CBC with Differential/Platelet     Status: Abnormal   Collection Time: 04/03/23  6:04 PM  Result Value Ref Range   WBC 12.1 (H) 4.0 - 10.5 K/uL   RBC 5.27 4.22 - 5.81 MIL/uL   Hemoglobin 15.4 13.0 - 17.0 g/dL   HCT 34.7 42.5 - 95.6 %   MCV 89.8 80.0 - 100.0 fL   MCH 29.2 26.0 - 34.0 pg   MCHC 32.6 30.0 - 36.0 g/dL   RDW 38.7 (H) 56.4 - 33.2 %   Platelets 178 150 - 400 K/uL   nRBC 0.0 0.0 - 0.2 %   Neutrophils Relative % 72 %   Neutro Abs 8.8 (H) 1.7 - 7.7 K/uL   Lymphocytes Relative 10 %   Lymphs Abs 1.2 0.7 - 4.0 K/uL   Monocytes Relative 16 %   Monocytes Absolute 2.0 (H) 0.1 - 1.0 K/uL   Eosinophils Relative 1 %   Eosinophils Absolute 0.1 0.0 - 0.5 K/uL   Basophils Relative 0 %   Basophils Absolute 0.0 0.0 - 0.1 K/uL   Immature Granulocytes 1 %   Abs Immature Granulocytes 0.06 0.00 - 0.07 K/uL    Comment: Performed at [facility], [address] [place], [place] [address]  CK     Status: Abnormal   Collection Time: 04/03/23  6:04 PM  Result Value Ref Range   Total CK 846 (H) 49 - 397 U/L    Comment: Performed at [facility], [address], [place], [place] [address]  I-Stat Lactic Acid, ED     Status: None   Collection Time: 04/03/23  6:22 PM  Result Value Ref Range   Lactic Acid, Venous 1.7 0.5 - 1.9 mmol/L  I-Stat Chem 8, ED     Status: Abnormal   Collection Time: 04/03/23  6:22 PM  Result Value Ref Range   Sodium 139 135 - 145 mmol/L   Potassium 3.3 (L) 3.5 - 5.1 mmol/L   Chloride 99 98 - 111 mmol/L   BUN 15 8 - 23 mg/dL   Creatinine, Ser 6.30 0.61 - 1.24 mg/dL   Glucose, Bld 160 (H) 70 - 99 mg/dL    Comment: Glucose reference range applies only to samples taken after fasting for at least 8 hours.   Calcium, Ion 1.18 1.15 - 1.40 mmol/L   TCO2 26 22 -  32 mmol/L   Hemoglobin 17.3 (H) 13.0 - 17.0 g/dL   HCT 09.8 11.9 - 14.7 %   CT Cervical Spine  Wo Contrast  Result Date: 04/03/2023 CLINICAL DATA:  Falls. EXAM: CT CERVICAL SPINE WITHOUT CONTRAST TECHNIQUE: Multidetector CT imaging of the cervical spine was performed without intravenous contrast. Multiplanar CT image reconstructions were also generated. RADIATION DOSE REDUCTION: This exam was performed according to the departmental dose-optimization program which includes automated exposure control, adjustment of the mA and/or kV according to patient size and/or use of iterative reconstruction technique. COMPARISON:  Cervical spine CT dated 12/21/2021. FINDINGS: Alignment: No acute subluxation. Skull base and vertebrae: There is there is extensive bridging osteophyte of the anterior cervical spine consistent with diffuse idiopathic skeletal hyperostosis. Fractures of the anterior osteophyte at C5-C6 extending into the superior aspect of the C6 vertebra, new since the prior CT. No other acute fracture. Soft tissues and spinal canal: Partial calcification of the posterior longitudinal ligament. No significant canal narrowing. Disc levels:  Multilevel degenerative changes. Upper chest: Negative. Other: Stranding of the fat plane of the left side of the neck. IMPRESSION: Acute fractures of the anterior osteophyte at C5-C6 extending into the superior aspect of the C6 vertebra. Findings consistent with a hyperextension injury. Further evaluation with MRI is recommended. These results were called by telephone at the time of interpretation on 04/03/2023 at 8:57 pm to provider [facility] , who verbally acknowledged these results. Electronically Signed   By: Elgie Collard M.D.   On: 04/03/2023 21:02   CT Maxillofacial Wo Contrast  Result Date: 04/03/2023 CLINICAL DATA:  Facial trauma, multiple ground level falls since last night EXAM: CT MAXILLOFACIAL WITHOUT CONTRAST TECHNIQUE: Multidetector CT imaging of the maxillofacial structures was performed. Multiplanar CT image reconstructions were also generated.  RADIATION DOSE REDUCTION: This exam was performed according to the departmental dose-optimization program which includes automated exposure control, adjustment of the mA and/or kV according to patient size and/or use of iterative reconstruction technique. COMPARISON:  None Available. FINDINGS: Osseous: No fracture or mandibular dislocation. No destructive process. Orbits: Negative. No traumatic or inflammatory finding. Sinuses: Clear. Soft tissues: Negative. Limited intracranial: No significant or unexpected finding. IMPRESSION: No displaced fracture or dislocation of the facial bones. Electronically Signed   By: Jearld Lesch M.D.   On: 04/03/2023 20:57   CT Head Wo Contrast  Result Date: 04/03/2023 CLINICAL DATA:  Head trauma, coagulopathy (Age 31-64y).  Fall. EXAM: CT HEAD WITHOUT CONTRAST TECHNIQUE: Contiguous axial images were obtained from the base of the skull through the vertex without intravenous contrast. RADIATION DOSE REDUCTION: This exam was performed according to the departmental dose-optimization program which includes automated exposure control, adjustment of the mA and/or kV according to patient size and/or use of iterative reconstruction technique. COMPARISON:  04/02/2023 FINDINGS: Brain: There is atrophy and chronic small vessel disease changes. No acute intracranial abnormality. Specifically, no hemorrhage, hydrocephalus, mass lesion, acute infarction, or significant intracranial injury. Vascular: No hyperdense vessel or unexpected calcification. Skull: No acute calvarial abnormality. Sinuses/Orbits: No acute findings Other: None IMPRESSION: Atrophy, chronic microvascular disease. No acute intracranial abnormality. Electronically Signed   By: Charlett Nose M.D.   On: 04/03/2023 20:38   CT HEAD WO CONTRAST ( )  Result Date: 04/02/2023 CLINICAL DATA:  Provided history: Mental status change, unknown cause. EXAM: CT HEAD WITHOUT CONTRAST TECHNIQUE: Contiguous axial images were obtained from  the base of the skull through the vertex without intravenous contrast. RADIATION DOSE REDUCTION: This exam was performed according to  the departmental dose-optimization program which includes automated exposure control, adjustment of the mA and/or kV according to patient size and/or use of iterative reconstruction technique. COMPARISON:  Head CT 12/21/2021. FINDINGS: Brain: Moderate-to-advanced generalized cerebral atrophy. Mild cerebellar atrophy. Patchy and ill-defined hypoattenuation within the cerebral white matter, nonspecific but compatible with moderate chronic small vessel ischemic disease. Redemonstrated small chronic lacunar infarct within the anterior limb of left internal capsule. There is no acute intracranial hemorrhage. No demarcated cortical infarct. No extra-axial fluid collection. No evidence of an intracranial mass. No midline shift. Vascular: No hyperdense vessel.  Atherosclerotic calcifications. Skull: No calvarial fracture or aggressive osseous lesion. Sinuses/Orbits: No mass or acute finding within the imaged orbits. No significant paranasal sinus disease. IMPRESSION: 1.  No evidence of an acute intracranial abnormality 2. Moderate chronic small vessel ischemic changes within the cerebral white matter. 3. Redemonstrated small chronic lacunar infarct within the anterior limb of left internal capsule. 4. Moderate-to-advanced generalized cerebral atrophy. Electronically Signed   By: Jackey Loge D.O.   On: 04/02/2023 17:42   DG Chest Port 1 View  Result Date: 04/02/2023 CLINICAL DATA:  Altered mental status. EXAM: PORTABLE CHEST 1 VIEW COMPARISON:  Radiographs 01/07/2018 and 03/25/2017.  CT 04/28/2014. FINDINGS: 1557 hours. Lower lung volumes with mild patient rotation to the right. The heart size and mediastinal contours are stable. Mild atelectasis at both lung bases. No confluent airspace disease, edema, pleural effusion or pneumothorax. No acute osseous findings are evident. Chronic  sclerosis of the right 1st rib is unchanged. There are mild degenerative changes in the spine. Telemetry leads overlie the chest. IMPRESSION: Lower lung volumes with mild bibasilar atelectasis. No acute cardiopulmonary process. Electronically Signed   By: Carey Bullocks M.D.   On: 04/02/2023 17:14    Pending Labs Unresulted Labs (From admission, onward)     Start     Ordered   04/04/23 0500  HIV Antibody (routine testing w rflx)  (HIV Antibody (Routine testing w reflex) panel)  Tomorrow morning,   R        04/03/23 2316   04/04/23 0500  TSH  Tomorrow morning,   R        04/03/23 2316   04/04/23 0500  Basic metabolic panel  Tomorrow morning,   R        04/03/23 2316   04/04/23 0500  CBC  Tomorrow morning,   R        04/03/23 2316   04/04/23 0500  Hemoglobin A1c  Tomorrow morning,   R       Comments: To assess prior glycemic control    04/03/23 2316   04/03/23 1719  Urinalysis, w/ Reflex to Culture (Infection Suspected) -Urine, Clean Catch  Once,   URGENT       Question:  Specimen Source  Answer:  Urine, Clean Catch   04/03/23 1721            Vitals/Pain Today's Vitals   04/03/23 1643 04/03/23 1646 04/03/23 1947  BP: (!) 151/104  (!) 145/95  Pulse: (!) 105  99  Resp: 16  18  Temp: 98.2 F (36.8 C)  98.7 F (37.1 C)  TempSrc:   Oral  SpO2: 93%  94%  PainSc:  8      Isolation Precautions No active isolations  Medications Medications  lactated ringers infusion (1,000 mLs Intravenous New Bag/Given 04/03/23 2317)  acetaminophen (TYLENOL) tablet 650 mg (has no administration in time range)  atorvastatin (LIPITOR) tablet 40 mg (has no administration  in time range)  perphenazine (TRILAFON) tablet 24 mg (has no administration in time range)  phenelzine (NARDIL) tablet 45 mg (has no administration in time range)  levothyroxine (SYNTHROID) tablet 100 mcg (has no administration in time range)  apixaban (ELIQUIS) tablet 5 mg (has no administration in time range)  benztropine  (COGENTIN) tablet 1 mg (has no administration in time range)  divalproex (DEPAKOTE ER) 24 hr tablet 500 mg (has no administration in time range)  insulin aspart (novoLOG) injection 0-15 Units (has no administration in time range)  lactated ringers bolus 1,000 mL (0 mLs Intravenous Stopped 04/03/23 2150)    Mobility non-ambulatory     Focused Assessments    R Recommendations: See Admitting Provider Note  Report given to:   Additional Notes: Pt in MRI. Will leave MRI and come to room. Pt is a high fall risk.

## 2023-04-03 NOTE — ED Notes (Signed)
Pt returned from imaging.

## 2023-04-03 NOTE — H&P (Incomplete)
Hospital Admission History and Physical Service Pager: [phone]  Patient name: [name] Medical record number: [id number] Date of Birth: 04-28-1961 Age: 62 y.o. Gender: male  Primary Care Provider: Tiffany Kocher, DO Consultants: Neurosurgery Code Status: Full code, discussed with patient Preferred Emergency Contact:   Contact Information     Name Relation Home Work Mobile   Pruett,Dana Mother [phone]  [phone]      Other Contacts   None on File      Chief Complaint: Altered mental status  Assessment and Plan: [name] is a 62 y.o. male presenting with altered mental status in the setting of multiple falls. Differential for presentation of this is broad and includes medication side effect, stroke, infection, dementia, electrolyte derangement, metabolic abnormalities, drug toxicity, MI.  Suspect patient's mental status chronically waxes and wanes in the setting of long term psychiatric illness and medication management, and patient may now have underlying dementia given atrophic findings on CT. This is further supported by reported memory issues per patient's psychiatrist, and resolving AMS in prior ED visit.  Workup thus far has been negative to suggest alternative cause of altered mental status including stroke (normal CT head), infection (WBC minimally elevated, no symptoms), electrolyte derangement (WNL), metabolic abnormality (no symptoms).  Patient's presentation is complicated by pertinent past medical history including diffuse idiopathic's skeletal hyperostosis, affective disorder, depression, and spinal stenosis.  Assessment & Plan Other closed nondisplaced fracture of fifth cervical vertebra, initial encounter [facility])  Disorientation As above, suspect likely chronic.   Chronic and Stable Problems: ***   FEN/GI: Heart healthy carb modified VTE Prophylaxis: Eliquis  Disposition: MedSurg  History of Present Illness:  [name] is a  62 y.o. male presenting with altered mental status.  On interview patient pleasant and alert but unable to coherently explain how he arrived to the hospital today.  Seems to be confusing his multiple falls, including visit to ED yesterday as well as prior visit with PCP.  States he is not in significant pain at this time.  Does note that he has abrasions on his elbows and knees from when he fell.  Does not feel weak, and has no numbness or tingling.  Denies headache blurry vision nausea or vomiting.  States he has not had his medicines in 4 days.  Reports he lives alone.  Per ED note patient fell 3 times last night and hit his head, face and had new neck pain.  He was found on the floor by EMS.  He estimates that he was on the floor for at least 7 hours.  He did note neck pain at this time.  Per chart review of patient's most recent visit with Dr. Lolly Mustache, patient has had chronic memory and balance issues likely due to prolonged long-term use of MAOI and first antipsychotic for his multiple psychiatric problems, however patient has been unwilling to switch medicines.  Of note, ED provider note from 09/27 notes that patient was initially not oriented to place and time after presenting yesterday for falls however improved to normal mental status and then requested to be discharged.  In the ED, vital signs were stable, labs are significant for an elevated CK to 846, lactic acid 1.7, potassium 3.3, glucose 133.  Imaging demonstrated no acute intracranial bleed, but did note acute C5/C6 osteophyte fracture of the cervical spine.  Neurosurgery consulted, who will make recommendations based on cervical MRI.  Patient was given 1 L bolus of LR and started on  maintenance IVF LR.  Review Of Systems: Per HPI.  Pertinent Past Medical History: Diffuse idiopathic skeletal hyperostosis Arterial dysfunction Hyperlipidemia Hypothyroidism Atrial fibrillation Type 2 diabetes Schizoaffective  disorder Depression Seizures Spinal stenosis  Remainder reviewed in history tab.   Pertinent Past Surgical History: Multiple orthopedic surgeries.  Remainder reviewed in history tab.  Pertinent Social History: Tobacco use: No  Alcohol use: No Other Substance use: No Lives by himself  Pertinent Family History: Reviewed.  Important Outpatient Medications: Tylenol - Not taking Allopurinol - 300mg  daily Lipitor - 40mg  daily Trilafon - 24mg  at bedtime Nardil - 45mg  BID  Synthroid - daily Ozempic - 1 weekly 1mg  Eliquis - 5mg  BID Cogentin - 1mg  daily Depakote - 1000mg  BID  Remainder reviewed in medication history.   Objective: BP (!) 145/95 (BP Location: Right Arm)   Pulse 99   Temp 98.7 F (37.1 C) (Oral)   Resp 18   SpO2 94%  Exam: General: Chronically ill-appearing, resting comfortably in hospital bed in cervical collar HEENT: Pupils equal round and reactive to light, no scleral icterus, In cervical collar, moist mucous membranes Cardiovascular: Irregularly irregular rhythm, no murmurs Respiratory: Clear to auscultation bilaterally, normal work of breathing Gastrointestinal: Soft, nontender, no rebound or guarding MSK: Bilateral abrasions on both knees and elbows Neuro: Alert to person, self, month but not year, limitedly oriented to situation.  Cranial nerves II through XII intact, symmetric facies, sensation intact, strength 5 out of 5 bilateral upper and lower extremities  Labs:  CBC BMET  Recent Labs  Lab 04/03/23 1804 04/03/23 1822  WBC 12.1*  --   HGB 15.4 17.3*  HCT 47.3 51.0  PLT 178  --    Recent Labs  Lab 04/03/23 1804 04/03/23 1822  NA 141 139  K 3.4* 3.3*  CL 97* 99  CO2 26  --   BUN 13 15  CREATININE 0.90 0.80  GLUCOSE 133* 125*  CALCIUM 9.8  --      CK-846 Lactic acid-1.7  EKG: Atrial fibrillation   Imaging Studies Performed:  CT Head - Atrophy, chronic microvascular disease. No acute intracranial abnormality.  CT  Cervical Spine - Acute fractures of the anterior osteophyte at C5-C6 extending into the superior aspect of the C6 vertebra. Findings consistent with a hyperextension injury. Further evaluation with MRI is recommended.  CT Maxillofacial - No displaced fracture or dislocation of the facial bones.    Celine Mans, MD 04/03/2023, 11:28 PM PGY-2, [facility]  FPTS Intern pager: [phone], text pages welcome Secure chat group [facility] Teaching Service

## 2023-04-03 NOTE — ED Notes (Signed)
Pt noted attempting to get out of the bed. Pt unsteady and has generalized weakness. Able to move all extremities, but extremely stiff. Fall precautions put in place. Room door kept open and fall mat on floor. Bed in low position and call light within easy reach. Urinal placed at bedside. Pt impulsive and agitated.

## 2023-04-03 NOTE — ED Notes (Signed)
Report received from Georgann Housekeeper RN. Assumed care of pt at this time.

## 2023-04-04 DIAGNOSIS — R410 Disorientation, unspecified: Secondary | ICD-10-CM

## 2023-04-04 DIAGNOSIS — R4182 Altered mental status, unspecified: Secondary | ICD-10-CM | POA: Diagnosis not present

## 2023-04-04 DIAGNOSIS — R748 Abnormal levels of other serum enzymes: Secondary | ICD-10-CM | POA: Diagnosis present

## 2023-04-04 DIAGNOSIS — R296 Repeated falls: Secondary | ICD-10-CM

## 2023-04-04 LAB — HEMOGLOBIN A1C
Hgb A1c MFr Bld: 6.4 % — ABNORMAL HIGH (ref 4.8–5.6)
Mean Plasma Glucose: 136.98 mg/dL

## 2023-04-04 LAB — BASIC METABOLIC PANEL
Anion gap: 15 (ref 5–15)
BUN: 12 mg/dL (ref 8–23)
CO2: 20 mmol/L — ABNORMAL LOW (ref 22–32)
Calcium: 8.7 mg/dL — ABNORMAL LOW (ref 8.9–10.3)
Chloride: 102 mmol/L (ref 98–111)
Creatinine, Ser: 0.73 mg/dL (ref 0.61–1.24)
GFR, Estimated: >60 mL/min (ref >60)
Glucose, Bld: 106 mg/dL — ABNORMAL HIGH (ref 70–99)
Potassium: 4.1 mmol/L (ref 3.5–5.1)
Sodium: 137 mmol/L (ref 135–145)

## 2023-04-04 LAB — GLUCOSE, CAPILLARY
Glucose-Capillary: 101 mg/dL — ABNORMAL HIGH (ref 70–99)
Glucose-Capillary: 129 mg/dL — ABNORMAL HIGH (ref 70–99)
Glucose-Capillary: 125 mg/dL — ABNORMAL HIGH (ref 70–99)
Glucose-Capillary: 104 mg/dL — ABNORMAL HIGH (ref 70–99)

## 2023-04-04 LAB — HIV ANTIBODY (ROUTINE TESTING W REFLEX): HIV Screen 4th Generation wRfx: NONREACTIVE

## 2023-04-04 LAB — CBC
HCT: 41.8 % (ref 39.0–52.0)
Hemoglobin: 13.9 g/dL (ref 13.0–17.0)
MCH: 30.2 pg (ref 26.0–34.0)
MCHC: 33.3 g/dL (ref 30.0–36.0)
MCV: 90.7 fL (ref 80.0–100.0)
Platelets: 149 10*3/uL — ABNORMAL LOW (ref 150–400)
RBC: 4.61 MIL/uL (ref 4.22–5.81)
RDW: 16 % — ABNORMAL HIGH (ref 11.5–15.5)
WBC: 8.7 10*3/uL (ref 4.0–10.5)
nRBC: 0 % (ref 0.0–0.2)

## 2023-04-04 LAB — TSH: TSH: 10.182 u[IU]/mL — ABNORMAL HIGH (ref 0.350–4.500)

## 2023-04-04 MED ORDER — PHENELZINE SULFATE 15 MG PO TABS
30.0000 mg | ORAL_TABLET | Freq: Two times a day (BID) | ORAL | Status: DC
  Administered 2023-04-04 – 2023-04-19 (×29): 30 mg via ORAL
  Filled 2023-04-04 (×36): qty 2

## 2023-04-04 MED ORDER — POLYETHYLENE GLYCOL 3350 17 G PO PACK
17.0000 g | PACK | Freq: Every day | ORAL | Status: DC
  Administered 2023-04-04 – 2023-04-05 (×2): 17 g via ORAL
  Filled 2023-04-04 (×2): qty 1

## 2023-04-04 MED ORDER — SENNOSIDES-DOCUSATE SODIUM 8.6-50 MG PO TABS
1.0000 | ORAL_TABLET | Freq: Every day | ORAL | Status: DC
  Administered 2023-04-04 – 2023-04-18 (×12): 1 via ORAL
  Filled 2023-04-04 (×12): qty 1

## 2023-04-04 MED ORDER — BENZTROPINE MESYLATE 0.5 MG PO TABS
0.5000 mg | ORAL_TABLET | Freq: Every day | ORAL | Status: DC
  Administered 2023-04-05 – 2023-04-07 (×3): 0.5 mg via ORAL
  Filled 2023-04-04 (×3): qty 1

## 2023-04-04 MED ORDER — LEVOTHYROXINE SODIUM 25 MCG PO TABS
125.0000 ug | ORAL_TABLET | Freq: Every day | ORAL | Status: DC
  Administered 2023-04-05 – 2023-04-19 (×14): 125 ug via ORAL
  Filled 2023-04-04 (×14): qty 1

## 2023-04-04 MED ORDER — PERPHENAZINE 4 MG PO TABS
8.0000 mg | ORAL_TABLET | Freq: Two times a day (BID) | ORAL | Status: DC
  Administered 2023-04-04 – 2023-04-07 (×6): 8 mg via ORAL
  Filled 2023-04-04: qty 2
  Filled 2023-04-04: qty 1
  Filled 2023-04-04 (×6): qty 2

## 2023-04-04 MED ORDER — ORAL CARE MOUTH RINSE: 15.0000 mL | OROMUCOSAL | Status: DC | PRN

## 2023-04-04 MED ORDER — DIVALPROEX SODIUM ER 500 MG PO TB24
500.0000 mg | ORAL_TABLET | Freq: Two times a day (BID) | ORAL | Status: DC
  Administered 2023-04-04 – 2023-04-07 (×6): 500 mg via ORAL
  Filled 2023-04-04 (×8): qty 1

## 2023-04-04 NOTE — Progress Notes (Signed)
No output today, bladder scan showed at 1416 and at 1416, informed MD, advised to In and out when >350 in next scan, will continue to monitor.

## 2023-04-04 NOTE — Consult Note (Addendum)
[facility] Face-to-Face Psychiatry Consult   Reason for Consult:  medication management Referring Physician:  Gaynell Face   Patient Identification: [name] MRN:  [id number] Principal Diagnosis: AMS (altered mental status) Diagnosis:  Principal Problem:   AMS (altered mental status) Active Problems:   T2DM (type 2 diabetes mellitus) (HCC)   C5 cervical fracture (HCC)   Elevated CK   Multiple falls   Total Time spent with patient: 20 minutes  Subjective:   [name] is a 62 yo male p/f encephalopathy of unknown origin in setting of recurrent falls. Patient has a h/o psychiatric illness on multiple antipsychotics with high side effect profile.     HPI:   Patient has some recollection of disorientation prior to admission.  He also was aware of the falls that preceded the admission.  He states that he has been on his current outpatient psychiatric medication for many decades, and that is working well to control his psychiatric symptoms.  He reports having tremors since his 45s.  Overall he reports that his mood is euthymic, denies feeling down depressed or sad for some time.  He reports that his sleep is adequate, appetite is okay, concentration and memory have had some decline over the recent years.  Denies any SI or HI.  Reports that anxiety level is not bothersome.  Denies having any panic attacks recently.  Denies any psychotic symptoms.  Denies any symptoms of mania or hypomania.  We discussed the concern for the patient's altered mental status, and also tremors, that are believed to be side effects to his current outpatient psychiatric medication.  Initially the patient stated that he understands this, and that has these medications have provided him psychiatric stability for sometime, he did not want any adjustments to the medication.  After further discussion, the patient is agreeable to some medication changes, and follow up with outpatient psychiatric provider.  We specifically  discussed decreasing the Cogentin, due to altered mental status and constipation, and to monitor any EPS or Parkinson symptoms with this medication be adjusted; decreasing Nardil dose, and decreasing his phenelzine dose to reduce EPS and tremors.  We discussed that his Depakote level was elevated upon initial presentation to emergency department but this might not have been a trough level, and this will need to be rechecked in the morning.  Patient reports he has a history of seizure many years ago, and is on this medication for seizures.  Discussed that additionally, other medications for his tremors could be adjusted on an outpatient basis including starting amantadine.  We discussed that his tremor is a resting tremor, postural tremor, and action tremor.  We discussed that he is on multiple medications that could be causing or contributing to his tremor.  We also discussed the dietary restrictions that he must adhere to, being on the Nardil.  Patient is already aware of this.  Past Psychiatric History: Reports a history of diagnosis of depression, schizoaffective disorder, OCD.  He reports a history of multiple psychiatric hospitalizations, and the last hospitalization he states was in 1985.  He denies any history of suicide attempts.  Risk to Self:  Denies Risk to Others:  Denies Prior Inpatient Therapy:  Yes Prior Outpatient Therapy:  Yes current  Past Medical History:  Past Medical History:  Diagnosis Date   A-fib (HCC)    Acute right-sided low back pain    Arrhythmia    Chronic a-fib (HCC)    Closed fracture of left distal radius    Contact dermatitis  04/16/2017   Depression    Depression    Hypothyroidism    Low back pain    Morbid obesity, BMI unknown (HCC)    Osteoarthritis    oa in bilateral knees   Schizophrenia (HCC)    Seizures (HCC)    Thyroid disease    Varicose veins     Past Surgical History:  Procedure Laterality Date   ACHILLES TENDON REPAIR  approx. 2004    right foot   CATARACT EXTRACTION     bilateral   OPEN REDUCTION INTERNAL FIXATION (ORIF) DISTAL RADIAL FRACTURE Left 07/21/2018   Procedure: OPEN REDUCTION INTERNAL FIXATION (ORIF) DISTAL RADIAL FRACTURE;  Surgeon: Betha Loa, MD;  Location: [facility];  Service: Orthopedics;  Laterality: Left;   stab phlebectomy  Right 11/09/2016   stab phlebectomy R leg by Josephina Gip MD   TOOTH EXTRACTION     Family History:  Family History  Problem Relation Age of Onset   Diabetes Father    Heart disease Father    Diabetes Brother    OCD Mother    Family Psychiatric  History: Patient reports a paternal aunt had significant paranoid symptoms.  Denies any known family history of suicide attempts.    Social History:  Social History   Substance and Sexual Activity  Alcohol Use No   Alcohol/week: 0.0 standard drinks of alcohol     Social History   Substance and Sexual Activity  Drug Use No    Social History   Socioeconomic History   Marital status: Single    Spouse name: Not on file   Number of children: Not on file   Years of education: Not on file   Highest education level: Not on file  Occupational History   Occupation: disabled  Tobacco Use   Smoking status: Never   Smokeless tobacco: Never  Vaping Use   Vaping status: Never Used  Substance and Sexual Activity   Alcohol use: No    Alcohol/week: 0.0 standard drinks of alcohol   Drug use: No   Sexual activity: Never  Other Topics Concern   Not on file  Social History Narrative   Not on file   Social Determinants of Health   Financial Resource Strain: Low Risk  (02/13/2023)   Overall Financial Resource Strain (CARDIA)    Difficulty of Paying Living Expenses: Not hard at all  Food Insecurity: No Food Insecurity (04/04/2023)   Hunger Vital Sign    Worried About Running Out of Food in the Last Year: Never true    Ran Out of Food in the Last Year: Never true  Transportation Needs: No Transportation Needs  (04/04/2023)   PRAPARE - Administrator, Civil Service (Medical): No    Lack of Transportation (Non-Medical): No  Physical Activity: Inactive (02/13/2023)   Exercise Vital Sign    Days of Exercise per Week: 0 days    Minutes of Exercise per Session: 0 min  Stress: No Stress Concern Present (02/13/2023)   [employer] of Occupational Health - Occupational Stress Questionnaire    Feeling of Stress : Not at all  Social Connections: Socially Isolated (02/13/2023)   Social Connection and Isolation Panel [NHANES]    Frequency of Communication with Friends and Family: More than three times a week    Frequency of Social Gatherings with Friends and Family: Three times a week    Attends Religious Services: Never    Active Member of Clubs or Organizations: No  Attends Banker Meetings: Never    Marital Status: Never married   Additional Social History:    Allergies:   Allergies  Allergen Reactions   Lamictal [Lamotrigine] Rash   Metformin And Related Rash    Labs:  Results for orders placed or performed during the hospital encounter of 04/03/23 (from the past 48 hour(s))  Comprehensive metabolic panel     Status: Abnormal   Collection Time: 04/03/23  6:04 PM  Result Value Ref Range   Sodium 141 135 - 145 mmol/L   Potassium 3.4 (L) 3.5 - 5.1 mmol/L   Chloride 97 (L) 98 - 111 mmol/L   CO2 26 22 - 32 mmol/L   Glucose, Bld 133 (H) 70 - 99 mg/dL    Comment: Glucose reference range applies only to samples taken after fasting for at least 8 hours.   BUN 13 8 - 23 mg/dL   Creatinine, Ser 1.61 0.61 - 1.24 mg/dL   Calcium 9.8 8.9 - 09.6 mg/dL   Total Protein 7.4 6.5 - 8.1 g/dL   Albumin 3.9 3.5 - 5.0 g/dL   AST 36 15 - 41 U/L   ALT 11 0 - 44 U/L   Alkaline Phosphatase 91 38 - 126 U/L   Total Bilirubin 1.2 0.3 - 1.2 mg/dL   GFR, Estimated >04 >54 mL/min    Comment: (NOTE) Calculated using the CKD-EPI Creatinine Equation (2021)    Anion gap 18 (H) 5 - 15     Comment: Performed at [facility], [address] [place], [place] [address]  CBC with Differential/Platelet     Status: Abnormal   Collection Time: 04/03/23  6:04 PM  Result Value Ref Range   WBC 12.1 (H) 4.0 - 10.5 K/uL   RBC 5.27 4.22 - 5.81 MIL/uL   Hemoglobin 15.4 13.0 - 17.0 g/dL   HCT 91.4 78.2 - 95.6 %   MCV 89.8 80.0 - 100.0 fL   MCH 29.2 26.0 - 34.0 pg   MCHC 32.6 30.0 - 36.0 g/dL   RDW 21.3 (H) 08.6 - 57.8 %   Platelets 178 150 - 400 K/uL   nRBC 0.0 0.0 - 0.2 %   Neutrophils Relative % 72 %   Neutro Abs 8.8 (H) 1.7 - 7.7 K/uL   Lymphocytes Relative 10 %   Lymphs Abs 1.2 0.7 - 4.0 K/uL   Monocytes Relative 16 %   Monocytes Absolute 2.0 (H) 0.1 - 1.0 K/uL   Eosinophils Relative 1 %   Eosinophils Absolute 0.1 0.0 - 0.5 K/uL   Basophils Relative 0 %   Basophils Absolute 0.0 0.0 - 0.1 K/uL   Immature Granulocytes 1 %   Abs Immature Granulocytes 0.06 0.00 - 0.07 K/uL    Comment: Performed at [facility], [address] [place], [place] [address]  CK     Status: Abnormal   Collection Time: 04/03/23  6:04 PM  Result Value Ref Range   Total CK 846 (H) 49 - 397 U/L    Comment: Performed at [facility], [address] [place], [place] [address]  I-Stat Lactic Acid, ED     Status: None   Collection Time: 04/03/23  6:22 PM  Result Value Ref Range   Lactic Acid, Venous 1.7 0.5 - 1.9 mmol/L  I-Stat Chem 8, ED     Status: Abnormal   Collection Time: 04/03/23  6:22 PM  Result Value Ref Range   Sodium 139 135 - 145 mmol/L  Potassium 3.3 (L) 3.5 - 5.1 mmol/L   Chloride 99 98 - 111 mmol/L   BUN 15 8 - 23 mg/dL   Creatinine, Ser 1.61 0.61 - 1.24 mg/dL   Glucose, Bld 096 (H) 70 - 99 mg/dL    Comment: Glucose reference range applies only to samples taken after fasting for at least 8 hours.   Calcium, Ion 1.18 1.15 - 1.40 mmol/L   TCO2 26 22 - 32 mmol/L   Hemoglobin 17.3 (H) 13.0 - 17.0 g/dL   HCT 04.5 40.9 - 81.1 %  HIV Antibody (routine testing w rflx)      Status: None   Collection Time: 04/04/23  5:36 AM  Result Value Ref Range   HIV Screen 4th Generation wRfx Non Reactive Non Reactive    Comment: Performed at [facility], [address] [place], [place] [address]  Basic metabolic panel     Status: Abnormal   Collection Time: 04/04/23  5:36 AM  Result Value Ref Range   Sodium 137 135 - 145 mmol/L   Potassium 4.1 3.5 - 5.1 mmol/L   Chloride 102 98 - 111 mmol/L   CO2 20 (L) 22 - 32 mmol/L   Glucose, Bld 106 (H) 70 - 99 mg/dL    Comment: Glucose reference range applies only to samples taken after fasting for at least 8 hours.   BUN 12 8 - 23 mg/dL   Creatinine, Ser 2.95 0.61 - 1.24 mg/dL   Calcium 8.7 (L) 8.9 - 10.3 mg/dL   GFR, Estimated >62 >13 mL/min    Comment: (NOTE) Calculated using the CKD-EPI Creatinine Equation (2021)    Anion gap 15 5 - 15    Comment: Performed at [facility], [address], [place], [place] [address]  TSH     Status: Abnormal   Collection Time: 04/04/23  5:36 AM  Result Value Ref Range   TSH 10.182 (H) 0.350 - 4.500 uIU/mL    Comment: Performed by a 3rd Generation assay with a functional sensitivity of <=0.01 uIU/mL. Performed at [facility], [address], [place], [place] [address]   Glucose, capillary     Status: Abnormal   Collection Time: 04/04/23  8:03 AM  Result Value Ref Range   Glucose-Capillary 101 (H) 70 - 99 mg/dL    Comment: Glucose reference range applies only to samples taken after fasting for at least 8 hours.  CBC     Status: Abnormal   Collection Time: 04/04/23 11:45 AM  Result Value Ref Range   WBC 8.7 4.0 - 10.5 K/uL   RBC 4.61 4.22 - 5.81 MIL/uL   Hemoglobin 13.9 13.0 - 17.0 g/dL   HCT 29.5 28.4 - 13.2 %   MCV 90.7 80.0 - 100.0 fL   MCH 30.2 26.0 - 34.0 pg   MCHC 33.3 30.0 - 36.0 g/dL   RDW 44.0 (H) 10.2 - 72.5 %   Platelets 149 (L) 150 - 400 K/uL   nRBC 0.0 0.0 - 0.2 %    Comment: Performed at [facility], [address], [place],  [place] [address]  Hemoglobin A1c     Status: Abnormal   Collection Time: 04/04/23 11:45 AM  Result Value Ref Range   Hgb A1c MFr Bld 6.4 (H) 4.8 - 5.6 %    Comment: (NOTE) Pre diabetes:          5.7%-6.4%  Diabetes:              >  6.4%  Glycemic control for   <7.0% adults with diabetes    Mean Plasma Glucose 136.98 mg/dL    Comment: Performed at [facility], [address] [place], [place] [address]  Glucose, capillary     Status: Abnormal   Collection Time: 04/04/23 12:21 PM  Result Value Ref Range   Glucose-Capillary 129 (H) 70 - 99 mg/dL    Comment: Glucose reference range applies only to samples taken after fasting for at least 8 hours.   Comment 1 Notify RN     Current Facility-Administered Medications  Medication Dose Route Frequency Provider Last Rate Last Admin   acetaminophen (TYLENOL) tablet 650 mg  650 mg Oral Q6H PRN Celine Mans, MD       atorvastatin (LIPITOR) tablet 40 mg  40 mg Oral Daily Celine Mans, MD   40 mg at 04/04/23 1019   [START ON 04/05/2023] benztropine (COGENTIN) tablet 0.5 mg  0.5 mg Oral Daily Khale Nigh, Harrold Donath, MD       divalproex (DEPAKOTE ER) 24 hr tablet 500 mg  500 mg Oral BID Rebekka Lobello, MD       insulin aspart (novoLOG) injection 0-15 Units  0-15 Units Subcutaneous TID WC Celine Mans, MD       lactated ringers infusion   Intravenous Continuous Alfredo Martinez, MD 125 mL/hr at 04/04/23 1030 New Bag at 04/04/23 1030   [START ON 04/05/2023] levothyroxine (SYNTHROID) tablet 125 mcg  125 mcg Oral Daily Alfredo Martinez, MD       Oral care mouth rinse  15 mL Mouth Rinse PRN Chambliss, Estill Batten, MD       perphenazine (TRILAFON) tablet 8 mg  8 mg Oral Q12H Brinleigh Tew, MD       phenelzine (NARDIL) tablet 30 mg  30 mg Oral BID Narek Kniss, Harrold Donath, MD       polyethylene glycol (MIRALAX / GLYCOLAX) packet 17 g  17 g Oral Daily Maxwell, Allee, MD       senna-docusate (Senokot-S) tablet 1 tablet  1 tablet Oral QHS Alfredo Martinez, MD         Musculoskeletal: There is some generalized stiffness.  No cogwheeling.  There is a resting tremor, action tremor, and postural tremor.            Psychiatric Specialty Exam:  Presentation  General Appearance:  Casual  Eye Contact: Fair  Speech: Normal Rate  Speech Volume: Normal  Handedness:No data recorded  Mood and Affect  Mood: Euthymic  Affect: Full Range   Thought Process  Thought Processes: Linear  Descriptions of Associations:Intact  Orientation:Partial  Thought Content:Logical  History of Schizophrenia/Schizoaffective disorder:No data recorded Duration of Psychotic Symptoms:No data recorded Hallucinations:Hallucinations: None  Ideas of Reference:None  Suicidal Thoughts:Suicidal Thoughts: No  Homicidal Thoughts:Homicidal Thoughts: No   Sensorium  Memory: Immediate Fair; Recent Fair; Remote Fair  Judgment: Fair  Insight: Fair   Art therapist  Concentration: Fair  Attention Span: Fair  Recall:No data recorded Fund of Knowledge:No data recorded Language:No data recorded  Psychomotor Activity  Psychomotor Activity: Psychomotor Activity: Normal   Assets  Assets:No data recorded  Sleep  Sleep: Sleep: Fair   Physical Exam: Physical Exam Vitals reviewed.  Constitutional:      General: He is not in acute distress.    Appearance: He is not toxic-appearing.  Pulmonary:     Effort: Pulmonary effort is normal. No respiratory distress.  Neurological:     Mental Status: He is alert.  Psychiatric:  Mood and Affect: Mood normal.        Behavior: Behavior normal.        Thought Content: Thought content normal.        Judgment: Judgment normal.    Review of Systems  Constitutional:  Negative for chills and fever.  Cardiovascular:  Negative for chest pain and palpitations.  Neurological:  Negative for dizziness, tingling, tremors and headaches.  Psychiatric/Behavioral:  Negative for depression,  hallucinations, memory loss, substance abuse and suicidal ideas. The patient is not nervous/anxious and does not have insomnia.   All other systems reviewed and are negative.  Blood pressure (!) 134/90, pulse 92, temperature 97.7 F (36.5 C), temperature source Oral, resp. rate 18, height 5\' 9"  (1.753 m), weight 96.9 kg, SpO2 98%. Body mass index is 31.55 kg/m.  Treatment Plan Summary: Daily contact with patient to assess and evaluate symptoms and progress in treatment and Medication management  Assessment: MDD Documented history of OCD and documented history of schizoaffective disorder  Plan: -At this time the patient does not require inpatient psychiatric hospitalization or a one-to-one sitter  -Patient can follow up with outpatient psychiatrist he is already established  -Order Depakote trough level for tomorrow morning.  Previous Depakote level on admission was 120, but this might not have been a trough level.  Patient reports he is on Depakote for history of seizure  -Reduce Nardil from 45 mg bid to 30 mg twice daily for MDD, reduced the dose for encephalopathy and tremor  -Reduce Trilafon from 24 mg at bedtime to 8 mg twice daily, which is a total of 16 mg total daily.  Reduce the dose for tremor, monitor patient psychiatric stability on the reduced dose  -Reduce Cogentin from 1 mg daily to 0.5 mg daily.  Reduced dose due to encephalopathy and constipation.  If tremor or other EPS worsens with the reduced dose, the Cogentin can be increased again to 1 mg daily, or the patient may can be considered to transition from Cogentin to amantadine  -Education was again provided to the patient about dietary restrictions while on an MAOI, and the patient was already aware of this.  Psychiatry service will sign off.  Patient can follow up as outpatient.   Disposition: No evidence of imminent risk to self or others at present.   Patient does not meet criteria for psychiatric inpatient  admission. Supportive therapy provided about ongoing stressors. Discussed crisis plan, support from social network, calling 911, coming to the Emergency Department, and calling Suicide Hotline.  Cristy Hilts, MD 04/04/2023 1:06 PM  Total Time Spent in Direct Patient Care:  I personally spent 60 minutes on the unit in direct patient care. The direct patient care time included face-to-face time with the patient, reviewing the patient's chart, communicating with other professionals, and coordinating care. Greater than 50% of this time was spent in counseling or coordinating care with the patient regarding goals of hospitalization, psycho-education, and discharge planning needs.   Phineas Inches, MD Psychiatrist

## 2023-04-04 NOTE — Assessment & Plan Note (Signed)
CBG checks + mSSI

## 2023-04-04 NOTE — Assessment & Plan Note (Signed)
Continue with C collar, unstable injury per MRI.  Will need to follow-up with neurosurgery for this. Follow up NSGY recs

## 2023-04-04 NOTE — Assessment & Plan Note (Signed)
As above, suspect likely chronic. Will monitor patient closely. -Admit to [facility] Medicine Teaching Service, attending Dr. Deirdre Priest, Med-Surg -F/u UA, TSH, HIV -Neuro checks q4h -Continue patient's psychiatric medications as below -AM BMP, CBC

## 2023-04-04 NOTE — Assessment & Plan Note (Signed)
Suspect patient has multiple falls due to impaired balance spinal stenosis, DISH and chronic cognitive deficits. -Fall precautions -PT/OT eval

## 2023-04-04 NOTE — TOC CAGE-AID Note (Signed)
Transition of Care [facility]) - CAGE-AID Screening   Patient Details  Name: [name] MRN: [id number] Date of Birth: 1960/10/22  Transition of Care [facility]) CM/SW Contact:    Leota Sauers, RN Phone Number: 04/04/2023, 9:13 PM   Clinical Narrative:  Denies use of alcohol and illicit substances. Resources not given at this time.   CAGE-AID Screening:    Have You Ever Felt You Ought to Cut Down on Your Drinking or Drug Use?: No Have People Annoyed You By Critizing Your Drinking Or Drug Use?: No Have You Felt Bad Or Guilty About Your Drinking Or Drug Use?: No Have You Ever Had a Drink or Used Drugs First Thing In The Morning to Steady Your Nerves or to Get Rid of a Hangover?: No CAGE-AID Score: 0  Substance Abuse Education Offered: No

## 2023-04-04 NOTE — Progress Notes (Signed)
Daily Progress Note Intern Pager: [phone]  Patient name: [name] Medical record number: [id number] Date of birth: 04-09-1961 Age: 62 y.o. Gender: male  Primary Care Provider: Tiffany Kocher, DO Consultants: NSGY Code Status: FULL  Pt Overview and Major Events to Date:  04/03/23: Admitted to FMTS   Assessment and Plan:  Zebbie Ace is a 62 yo male p/f encephalopathy of unknown origin in setting of recurrent falls. Patient has a h/o psychiatric illness on multiple antipsychotics with high side effect profile. Ddx includes CNS insults, systemic infection, metabolic cause, toxin exposure, systemic cause, psychiatric condition, delirium.  Assessment & Plan AMS (altered mental status) Higher likelihood that he has chronic waxing and waning mentation changes + psychiatric medication as cause of encephalopathy. No evidence of infectious etiology, CT head with chronic atrophy, UA unremarkable, and improved today.  Unknown cause of mentation changes but will continue workup. - Add RPR -Neurochecks every 4 - Psychiatry consult entered for psychiatric medication review C5 cervical fracture (HCC) Continue with C collar, unstable injury per MRI.  Will need to follow-up with neurosurgery for this. Follow up NSGY recs  T2DM (type 2 diabetes mellitus) (HCC) CBG checks + mSSI Elevated CK On IV fluids x 12 hours.  Encourage oral hydration Multiple falls Multiple falls in the setting of DISH.  Patient lives home alone and may not be able to sustain this. -PT/OT - Fall precautions -TOC consulted  Chronic and Stable Problems:  Hypothyroidism-Synthroid->increased dosage per TSH  Schizoaffective disorder-perphenazine 24 mg nightly, phenelzine 45 mg twice daily, Cogentin 1 mg daily History of seizure disorder-Depakote 500 mg twice daily Hyperlipidemia-Lipitor 40 Mg daily Atrial fibrillation-Eliquis 5 mg twice daily   FEN/GI: Heart healthy carb modified PPx: Eliquis  Dispo:Pending  PT recommendations  pending clinical improvement . Barriers include continued work up for [employer] fx and AMS.   Subjective:  Patient reports that he is doing well this morning.  He knows where he is and who he is and also reports that he has had intermittent confusion that has worsened over the past few weeks.  Objective: Temp:  [97.8 F (36.6 C)-98.7 F (37.1 C)] 97.8 F (36.6 C) (09/29 0720) Pulse Rate:  [83-105] 86 (09/29 0720) Resp:  [16-18] 17 (09/29 0720) BP: (135-151)/(85-104) 135/85 (09/29 0720) SpO2:  [93 %-98 %] 98 % (09/29 0720) Weight:  [96.9 kg] 96.9 kg (09/29 0044) Physical Exam: General: Alert and oriented x 3 today, awakens easily to verbal stimulation, resting in cervical collar Cardiovascular: Irregularly irregular Respiratory: Clear to auscultation bilaterally Abdomen: No tenderness to palpation Extremities: Able to move extremities  Laboratory: Most recent CBC Lab Results  Component Value Date   WBC 12.1 (H) 04/03/2023   HGB 17.3 (H) 04/03/2023   HCT 51.0 04/03/2023   MCV 89.8 04/03/2023   PLT 178 04/03/2023   Most recent BMP    Latest Ref Rng & Units 04/04/2023    5:36 AM  BMP  Glucose 70 - 99 mg/dL 147   BUN 8 - 23 mg/dL 12   Creatinine 8.29 - 1.24 mg/dL 5.62   Sodium 130 - 865 mmol/L 137   Potassium 3.5 - 5.1 mmol/L 4.1   Chloride 98 - 111 mmol/L 102   CO2 22 - 32 mmol/L 20   Calcium 8.9 - 10.3 mg/dL 8.7    Cspine MRI  IMPRESSION: 1. Acute fracture extending through a bridging osteophyte at C5-6, with extension through the underlying superior endplate of C6. Subtle widening of the C5-6 interspace.  Overall, constellation of findings are consistent with an acute hyperextension injury. 2. Acute ligamentous injury involving the anterior longitudinal ligament, with suspected additional ligamentous injury of the posterior longitudinal ligament and posterior interspinous ligaments. This is consistent with an unstable injury. 3. No evidence for  traumatic cord injury. 4. Diffuse bridging osteophytic spurring with secondary bony ankylosis throughout the visualized cervicothoracic spine, compatible with DISH. 5. Underlying multilevel cervical spondylosis, most pronounced at C5-6 where there is resultant moderate to severe spinal stenosis. Severe bilateral C6 and C7 foraminal stenosis.  Alfredo Martinez, MD 04/04/2023, 7:40 AM  PGY-3, [facility] FPTS Intern pager: [phone], text pages welcome Secure chat group [facility] Teaching Service

## 2023-04-04 NOTE — Progress Notes (Addendum)
PT Cancellation Note  Patient Details Name: [name] MRN: [id number] DOB: Sep 24, 1960   Cancelled Treatment:    Reason Eval/Treat Not Completed: Other (comment) Noted pt with cervical fractures.  Per ED notes discussed with neurosurgery and in agreement for cervical collar but neurosurgery to review MRI and consult this morning if needed. Confirmed with medical team.  Will f/u later today, after further neurosurgery input.   Anise Salvo, PT Acute Rehab [facility] Rehab [phone]   Rayetta Humphrey 04/04/2023, 9:36 AM

## 2023-04-04 NOTE — Progress Notes (Signed)
PT Cancellation Note  Patient Details Name: KWASI JOUNG MRN: [id number] DOB: 1960-09-09   Cancelled Treatment:    Reason Eval/Treat Not Completed: Medical issues which prohibited therapy  Noted neurosurgery has seen pt and recommend surgical stabilization for unstable fracture later this week with [facility] J on at all times.  Spoke with medical team and will continue to hold therapy.  Anise Salvo, PT Acute Rehab [facility] Rehab [phone]  Rayetta Humphrey 04/04/2023, 2:06 PM

## 2023-04-04 NOTE — Assessment & Plan Note (Addendum)
Higher likelihood that he has chronic waxing and waning mentation changes + psychiatric medication as cause of encephalopathy. No evidence of infectious etiology, CT head with chronic atrophy, UA unremarkable, and improved today.  Unknown cause of mentation changes but will continue workup. - Add RPR -Neurochecks every 4 - Psychiatry consult entered for psychiatric medication review

## 2023-04-04 NOTE — Assessment & Plan Note (Signed)
CK mildly elevated in the setting of being found down for several hours.  He does not meet rhabdomyolysis criteria.  Creatinine and lactic acid is reassuringly within normal limits. -S/p 1 L LR bolus -Continue maintenance IVF LR for 12 hours -AM BMP

## 2023-04-04 NOTE — Hospital Course (Addendum)
[name] is a 62 y.o.male with a history of Afib, seizure disorder, schizoaffective disorder, MDD who was admitted to the [facility] Teaching Service at [facility] for Altered Mental Status. His hospital course is detailed below:  C5 cervical fracture  Diffuse idiopathic skeletal hyperostosis Presented after fall, found on floor by EMS, and having new neck pain. Imaging including CT spine and MRI spine showed unstable C5-C6 fracture.  Pain controlled on acetaminophen 650 mg.  Neurosurgery consulted, placed patient in C-collar, then performed operative stabilization 10/3 without incident. Patient made impressive progress while inpatient. Patient discharged to SNF at [facility] for continued recovery.  Altered Mental Status  Chronic Memory and Balance issues 2/2  Psych Meds  Schizoaffective Disorder  MDD Pt presented confused and balance issues leading to multiple falls, which lead to cervical fracture as above. Per outpt psych note, this was thought to be due to his outpt psych meds, but pt was unwilling to adjust. CT head showed chronic atrophy, but no acute changes. Ethanol, ammonia, salicylate wnl. No signs of infection, UA unremarkable.  B12 and RPR normal. Psychiatry was consulted inpt, and reduced his Nardil, Trilafon, Congentin. Mental status waxed and waned over admission similar to observations by his mother.  Seemed to have improved by final days of stay.  Worked with the transition of care team to find SNF close to mother's house.  Hypothyroidism Pt has elevated TSH on admission. Pt has history of hypothyroidism, previously on 100 mcg Levothyroxine, which was increased to 125 mcg.  Patient denied palpitations.  Other chronic conditions were medically managed with home medications and formulary alternatives as necessary: HLD: atorvastatin 40 mg daily T2DM: Managed w/ SSI while admitted.  Likely last took his home Ozempic ~ 9/24.  Restart home ozempic at discharge. MDD: phenelzine 30 mg  BID Schizoaffective Disorder: Perphenazine 16 mg nightly, Depakote 1000 g, daily Tremor: continued Benztropine at lower dose 1 mg ? 0.5 mg  PCP Follow-up Recommendations: F/u thyroid meds F/u with OP psychiatrist 10/17. Adjustments were made to Nardil, Trilafon, and Congentin. Consider neurology referral for concern for Parkinson's vs other causes of tremor (has appt December 9) Consider outpatient sleep study for OSA

## 2023-04-04 NOTE — Plan of Care (Signed)
  Problem: Nutritional: Goal: Maintenance of adequate nutrition will improve Outcome: Progressing   Problem: Skin Integrity: Goal: Risk for impaired skin integrity will decrease Outcome: Progressing   Problem: Clinical Measurements: Goal: Respiratory complications will improve Outcome: Progressing Goal: Cardiovascular complication will be avoided Outcome: Progressing   Problem: Coping: Goal: Level of anxiety will decrease Outcome: Progressing

## 2023-04-04 NOTE — Assessment & Plan Note (Signed)
Multiple falls in the setting of DISH.  Patient lives home alone and may not be able to sustain this. -PT/OT - Fall precautions -TOC consulted

## 2023-04-04 NOTE — Progress Notes (Addendum)
Consult placed to NSGY to view C-spine MR results. Spoke to Diplomatic Services operational officer and provided information for patient and call back information. Awaiting return call.   Spoke to Dr. Conchita Paris who had already seen patient. Requires surgical intervention, holding Eliquis prior to surgery. Will not likely happen tomorrow but awaiting NSGY recs. Appreciate assistance.   [name] Geophysical data processor

## 2023-04-04 NOTE — Progress Notes (Signed)
OT Cancellation Note  Patient Details Name: [name] MRN: [id number] DOB: [birth year]   Cancelled Treatment:    Reason Eval/Treat Not Completed: Patient not medically ready. Per ED notes, cervical collar but still awaiting neurosurgery to look at MRI, who per chat states hold for now until plan in place.   Tyler Deis, OTR/L [facility] Acute Rehabilitation Office: [phone]   Myrla Halsted 04/04/2023, 10:05 AM

## 2023-04-04 NOTE — Assessment & Plan Note (Signed)
On IV fluids x 12 hours.  Encourage oral hydration

## 2023-04-04 NOTE — Consult Note (Signed)
Chief Complaint   Chief Complaint  Patient presents with   Fall    History of Present Illness  [name] is a 62 y.o. male admitted through the ED after presenting with AMS and multiple recent falls. He reports having fallen at least three time a few days ago. Since then he reports significant primarily posterior neck pain. No ne N/T/W of the arms although he does have chronic tremors likely related to long-term psychiatric medications for hx of Schizoaffective disorder and hx of SZ.  Past Medical History   Past Medical History:  Diagnosis Date   A-fib [facility])    Acute right-sided low back pain    Arrhythmia    Chronic a-fib (HCC)    Closed fracture of left distal radius    Contact dermatitis 04/16/2017   Depression    Depression    Hypothyroidism    Low back pain    Morbid obesity, BMI unknown (HCC)    Osteoarthritis    oa in bilateral knees   Schizophrenia (HCC)    Seizures (HCC)    Thyroid disease    Varicose veins     Past Surgical History   Past Surgical History:  Procedure Laterality Date   ACHILLES TENDON REPAIR  approx. 2004   right foot   CATARACT EXTRACTION     bilateral   OPEN REDUCTION INTERNAL FIXATION (ORIF) DISTAL RADIAL FRACTURE Left 07/21/2018   Procedure: OPEN REDUCTION INTERNAL FIXATION (ORIF) DISTAL RADIAL FRACTURE;  Surgeon: Betha Loa, MD;  Location: [facility];  Service: Orthopedics;  Laterality: Left;   stab phlebectomy  Right 11/09/2016   stab phlebectomy R leg by Josephina Gip MD   TOOTH EXTRACTION      Social History   Social History   Tobacco Use   Smoking status: Never   Smokeless tobacco: Never  Vaping Use   Vaping status: Never Used  Substance Use Topics   Alcohol use: No    Alcohol/week: 0.0 standard drinks of alcohol   Drug use: No    Medications   Prior to Admission medications   Medication Sig Start Date End Date Taking? Authorizing Provider  acetaminophen (TYLENOL) 500 MG tablet Take 500-1,000  mg by mouth 2 (two) times daily as needed for mild pain.   Yes [provider]  allopurinol (ZYLOPRIM) 300 MG tablet TAKE 1 TABLET BY MOUTH EVERY DAY 10/01/22  Yes Maury Dus, MD  apixaban (ELIQUIS) 5 MG TABS tablet Take 1 tablet (5 mg total) by mouth 2 (two) times daily. 10/19/22  Yes McDiarmid, Leighton Roach, MD  atorvastatin (LIPITOR) 40 MG tablet TAKE 1 TABLET BY MOUTH EVERY DAY 04/15/22  Yes Lilland, Alana, DO  benztropine (COGENTIN) 1 MG tablet Take 1 tablet (1 mg total) by mouth daily. 02/23/23  Yes Arfeen, Phillips Grout, MD  divalproex (DEPAKOTE ER) 500 MG 24 hr tablet Take 1 tablet (500 mg total) by mouth 2 (two) times daily. 02/23/23  Yes Arfeen, Phillips Grout, MD  levothyroxine (SYNTHROID) 100 MCG tablet TAKE 1 TABLET BY MOUTH EVERY DAY 11/27/22  Yes Lilland, Alana, DO  perphenazine (TRILAFON) 8 MG tablet Take 3 tablets (24 mg total) by mouth at bedtime. Patient taking differently: Take 24 mg by mouth 2 (two) times daily. 02/23/23  Yes Arfeen, Phillips Grout, MD  phenelzine (NARDIL) 15 MG tablet Take 3 tablets (45 mg total) by mouth 2 (two) times daily. 02/23/23  Yes Arfeen, Phillips Grout, MD  Semaglutide, 1 MG/DOSE, 4 MG/3ML SOPN Inject 1 mg into  the skin once a week. 02/19/23  Yes Tiffany Kocher, DO    Allergies   Allergies  Allergen Reactions   Lamictal [Lamotrigine] Rash   Metformin And Related Rash    Review of Systems  ROS  Neurologic Exam  Awake, alert, oriented Memory and concentration grossly intact Speech fluent, appropriate CN grossly intact Motor exam: Upper Extremities Deltoid Bicep Tricep Grip  Right 5/5 5/5 5/5 5/5  Left 5/5 5/5 5/5 5/5   Lower Extremities IP Quad PF DF EHL  Right 5/5 5/5 5/5 5/5 5/5  Left 5/5 5/5 5/5 5/5 5/5   Sensation grossly intact to LT  Imaging  CT C-spine and MRI C-spine both personally reviewed and demonstrate flowing anterior cervical osteophytes consistent with DISH.  There is a fracture through the anterior osteophyte and through the disc space at  C5-6 consistent with a hyperextension injury.  MRI also personally reviewed and demonstrates trans discal fracture at C5-6 with likely disruption of both the anterior longitudinal ligament, posterior longitudinal ligament, and possibly ligamentum flavum.  Posterior facet complex appears to be intact.  Impression  - 62 y.o. male s/p fall, neurologically at baseline with unstable C5-6 fracture in the setting of DISH. Pt has been anticoagulated on Eliquis for Afib  Plan  - Cont [place]-J collar at all times - Cont to hold Eliquis - Will plan on operative stabilization of C5-6 fracture later this week   I have reviewed the imaging findings with the patient and the need for operative stabilization. All questions answered this morning.  Lisbeth Renshaw, MD [facility]

## 2023-04-04 NOTE — Assessment & Plan Note (Signed)
Last A1c 6.4.  Home regimen includes Ozempic 1 mg weekly. -Hold Ozempic and patient -CBG checks with meals and before bed -Moderate sliding scale insulin

## 2023-04-04 NOTE — Progress Notes (Signed)
OT Cancellation Note  Patient Details Name: GRADY MOHABIR MRN: [id number] DOB: 1961/02/26   Cancelled Treatment:    Reason Eval/Treat Not Completed: Patient not medically ready  Neurosurgery recommended surgical stabilization later this week with [place] J on at all times. Continue to hold therapy.   Tyler Deis, OTR/L [facility] Acute Rehabilitation Office: [phone]   Myrla Halsted 04/04/2023, 2:08 PM

## 2023-04-04 NOTE — Assessment & Plan Note (Addendum)
Stable. Patient has reassuringly neurologic exam and his pain is controlled.  -Neurosurgery consulted, recs appreciated -Cervical collar -Neuro checks q4h -Tylenol prn -F/u cervical MRI

## 2023-04-05 DIAGNOSIS — S12491A Other nondisplaced fracture of fifth cervical vertebra, initial encounter for closed fracture: Secondary | ICD-10-CM

## 2023-04-05 LAB — CBC
HCT: 39.6 % (ref 39.0–52.0)
Hemoglobin: 13.1 g/dL (ref 13.0–17.0)
MCH: 29.2 pg (ref 26.0–34.0)
MCHC: 33.1 g/dL (ref 30.0–36.0)
MCV: 88.4 fL (ref 80.0–100.0)
Platelets: 139 10*3/uL — ABNORMAL LOW (ref 150–400)
RBC: 4.48 MIL/uL (ref 4.22–5.81)
RDW: 15.8 % — ABNORMAL HIGH (ref 11.5–15.5)
WBC: 9.6 10*3/uL (ref 4.0–10.5)
nRBC: 0 % (ref 0.0–0.2)

## 2023-04-05 LAB — BASIC METABOLIC PANEL
Anion gap: 12 (ref 5–15)
BUN: 8 mg/dL (ref 8–23)
CO2: 26 mmol/L (ref 22–32)
Calcium: 8.6 mg/dL — ABNORMAL LOW (ref 8.9–10.3)
Chloride: 98 mmol/L (ref 98–111)
Creatinine, Ser: 0.8 mg/dL (ref 0.61–1.24)
GFR, Estimated: >60 mL/min (ref >60)
Glucose, Bld: 100 mg/dL — ABNORMAL HIGH (ref 70–99)
Potassium: 3.6 mmol/L (ref 3.5–5.1)
Sodium: 136 mmol/L (ref 135–145)

## 2023-04-05 LAB — VALPROIC ACID LEVEL: Valproic Acid Lvl: 88 ug/mL (ref 50.0–100.0)

## 2023-04-05 LAB — GLUCOSE, CAPILLARY
Glucose-Capillary: 105 mg/dL — ABNORMAL HIGH (ref 70–99)
Glucose-Capillary: 150 mg/dL — ABNORMAL HIGH (ref 70–99)
Glucose-Capillary: 123 mg/dL — ABNORMAL HIGH (ref 70–99)
Glucose-Capillary: 101 mg/dL — ABNORMAL HIGH (ref 70–99)

## 2023-04-05 MED ORDER — HEPARIN SODIUM (PORCINE) 5000 UNIT/ML IJ SOLN
5000.0000 [IU] | Freq: Three times a day (TID) | INTRAMUSCULAR | Status: AC
  Administered 2023-04-05 – 2023-04-07 (×8): 5000 [IU] via SUBCUTANEOUS
  Filled 2023-04-05 (×8): qty 1

## 2023-04-05 MED ORDER — POLYETHYLENE GLYCOL 3350 17 G PO PACK
34.0000 g | PACK | Freq: Every day | ORAL | Status: DC
  Administered 2023-04-06 – 2023-04-19 (×12): 34 g via ORAL
  Filled 2023-04-05 (×12): qty 2

## 2023-04-05 MED ORDER — BISACODYL 10 MG RE SUPP
10.0000 mg | Freq: Once | RECTAL | Status: AC
  Administered 2023-04-05: 10 mg via RECTAL
  Filled 2023-04-05: qty 1

## 2023-04-05 NOTE — Assessment & Plan Note (Addendum)
Pt has elevated TSH on admission. Pt has history of hypothyroidism, previously on 100 mcg levo. - Increased to 125 mcg levothyroxine

## 2023-04-05 NOTE — TOC Initial Note (Signed)
Transition of Care [facility]) - Initial/Assessment Note    Patient Details  Name: [name] MRN: [id number] Date of Birth: 08-02-1960  Transition [facility]) CM/SW Contact:    Kermit Balo, RN Phone Number: 04/05/2023, 3:20 PM  Clinical Narrative:                  Pt is from home alone. He states he drives himself and manages his own medications.  CM asked how he would get from home to an appointment not being able to drive after surgery and he could not come up with an answer. He couldn't follow the question. Pt states his mom lives in [place], [place].  Plan is for surgery on Thursday.  TOC following.   Expected Discharge Plan:  (to be determined) Barriers to Discharge: Continued Medical Work up   Patient Goals and CMS Choice            Expected Discharge Plan and Services   Discharge Planning Services: CM Consult   Living arrangements for the past 2 months: Apartment                                      Prior Living Arrangements/Services Living arrangements for the past 2 months: Apartment Lives with:: Self Patient language and need for interpreter reviewed:: Yes Do you feel safe going back to the place where you live?: Yes        Care giver support system in place?: No (comment)   Criminal Activity/Legal Involvement Pertinent to Current Situation/Hospitalization: No - Comment as needed  Activities of Daily Living   ADL Screening (condition at time of admission) Does the patient have a NEW difficulty with bathing/dressing/toileting/self-feeding that is expected to last >3 days?: Yes (Initiates electronic notice to provider for possible OT consult) Does the patient have a NEW difficulty with getting in/out of bed, walking, or climbing stairs that is expected to last >3 days?: Yes (Initiates electronic notice to provider for possible PT consult) Does the patient have a NEW difficulty with communication that is expected to last >3 days?: No Is the patient  deaf or have difficulty hearing?: No Does the patient have difficulty seeing, even when wearing glasses/contacts?: No Does the patient have difficulty concentrating, remembering, or making decisions?: No  Permission Sought/Granted                  Emotional Assessment Appearance:: Appears stated age Attitude/Demeanor/Rapport: Engaged   Orientation: : Oriented to Self   Psych Involvement: No (comment)  Admission diagnosis:  Disorientation [R41.0] Elevated CK [R74.8] Multiple falls [R29.6] Type 2 diabetes mellitus without complication, without long-term current use of insulin (HCC) [E11.9] Other closed nondisplaced fracture of fifth cervical vertebra, initial encounter (HCC) [S12.491A] C5 cervical fracture (HCC) [S12.400A] Patient Active Problem List   Diagnosis Date Noted   Elevated CK 04/04/2023   Multiple falls 04/04/2023   C5 cervical fracture (HCC) 04/03/2023   Chronic constipation 12/21/2022   Belching 12/14/2022   DISH (diffuse idiopathic skeletal hyperostosis) 05/12/2022   Bilateral primary osteoarthritis of hip 08/13/2021   Statin intolerance 06/24/2021   Spinal stenosis of lumbar region 01/16/2020   Chronic lumbar radiculopathy 12/08/2019   Hyperlipidemia associated with type 2 diabetes mellitus (HCC) 12/08/2019   Vertigo 11/03/2019   Paronychia of left index finger 05/19/2019   Rosacea 10/24/2018   Loss of weight 09/09/2018   Schizoaffective disorder, depressive type (  HCC) 04/08/2018   Varicose veins of bilateral lower extremities with other complications 02/03/2016   Erectile dysfunction 04/26/2015   Hypothyroidism    Seizures (HCC)    Persistent atrial fibrillation (HCC)    BMI 34.0-34.9,adult    Acute bilateral low back pain without sciatica    Seborrheic dermatitis 02/10/2007   T2DM (type 2 diabetes mellitus) (HCC) 02/10/2007   PCP:  Tiffany Kocher, DO Pharmacy:   [phone] [place], [place] - [address] AT CORNER OF [facility]  [address] [place] [address] Phone: [phone] Fax: [phone]  [id number] [place], [place] - [address] [place] [address] Phone: [phone] Fax: [phone]     Social Determinants of Health (SDOH) Social History: SDOH Screenings   Food Insecurity: No Food Insecurity (04/04/2023)  Housing: Low Risk  (04/04/2023)  Transportation Needs: No Transportation Needs (04/04/2023)  Utilities: Not At Risk (04/04/2023)  Alcohol Screen: Low Risk  (02/13/2023)  Depression (PHQ2-9): Low Risk  (02/19/2023)  Recent Concern: Depression (PHQ2-9) - Medium Risk (12/21/2022)  Financial Resource Strain: Low Risk  (02/13/2023)  Physical Activity: Inactive (02/13/2023)  Social Connections: Socially Isolated (02/13/2023)  Stress: No Stress Concern Present (02/13/2023)  Tobacco Use: Low Risk  (04/03/2023)  Health Literacy: Adequate Health Literacy (02/13/2023)   SDOH Interventions: Food Insecurity Interventions: Intervention Not Indicated Housing Interventions: Intervention Not Indicated Transportation Interventions: Intervention Not Indicated Utilities Interventions: Intervention Not Indicated   Readmission Risk Interventions     No data to display

## 2023-04-05 NOTE — Assessment & Plan Note (Addendum)
Continue with C collar, unstable injury per MRI. Neurosurgery via [facility].  - NSGY scheduling his surgery for Thursday afternoon 10/3 - Consulted them for their preferred VTE prophylaxis between now and then given their desire to not have eliquis on board before surgery.

## 2023-04-05 NOTE — Progress Notes (Signed)
Brief Progress Note: Spoke with Dr Conchita Paris at 1430. Discussed our desire for VTE prophylaxis between now and Thursday.  Dr Conchita Paris requested that we start Heparin 5000 units Q8 until the night before the surgery.  Heparin orders have been placed so that his last dose will be Wednesday 10/2 at 2200.

## 2023-04-05 NOTE — Progress Notes (Signed)
  NEUROSURGERY PROGRESS NOTE   No issues overnight. Pt without c/o, unchanged neck pain. No new N/T/W.  EXAM:  BP (!) 157/90 (BP Location: Right Arm)   Pulse 92   Temp 98.3 F (36.8 C) (Oral)   Resp 18   Ht 5\' 9"  (1.753 m)   Wt 96.9 kg   SpO2 94%   BMI 31.55 kg/m   Awake, alert, oriented  Speech fluent, appropriate  CN grossly intact  5/5 BUE/BLE   IMPRESSION:  62 y.o. male with unstable C5-6 hyperextension fracture with DISH requiring operative stabilization.  PLAN: - Cont to hold Eliquis - Planning on C5-6 ACDF on Thursday afternoon   Lisbeth Renshaw, MD [facility]

## 2023-04-05 NOTE — Progress Notes (Signed)
OT Cancellation Note  Patient Details Name: HALSEY HAMMEN MRN: [id number] DOB: [birth year]   Cancelled Treatment:    Reason Eval/Treat Not Completed: Medical issues which prohibited therapy (Prior PT and OT notes indicate that medical team has decided to hold comprehensive OT evaluation until c-spine surgically stabilized (Thursday?). Will continue to follow and monitor for orders, post-op.)  Donia Pounds 04/05/2023, 10:16 AM

## 2023-04-05 NOTE — Assessment & Plan Note (Addendum)
Multiple falls in the setting of DISH.  Patient lives home alone and may not be able to sustain this. -PT/OT - Fall precautions -TOC consulted

## 2023-04-05 NOTE — Assessment & Plan Note (Signed)
On IV fluids x 12 hours.  Encourage oral hydration

## 2023-04-05 NOTE — Plan of Care (Signed)
  Problem: Skin Integrity: Goal: Risk for impaired skin integrity will decrease Outcome: Progressing   Problem: Education: Goal: Knowledge of General Education information will improve Description: Including pain rating scale, medication(s)/side effects and non-pharmacologic comfort measures Outcome: Progressing   Problem: Clinical Measurements: Goal: Respiratory complications will improve Outcome: Progressing Goal: Cardiovascular complication will be avoided Outcome: Progressing

## 2023-04-05 NOTE — Assessment & Plan Note (Addendum)
Pt was alert and oriented today x 4. He reported feeling better and thinking more clearly. He remembered events over the weekend and asked relevant questions about his course of hospitalization. - Add RPR? - Continue Neurochecks at Q4 - Psychiatry consult completed, they recommended reductions in his dosages of medicines affecting ACH-ase. Reductions have completed.

## 2023-04-05 NOTE — Assessment & Plan Note (Signed)
CBG checks + mSSI

## 2023-04-05 NOTE — Progress Notes (Signed)
PT Cancellation Note  Patient Details Name: [name] MRN: [id number] DOB: Sep 27, 1960   Cancelled Treatment:    Reason Eval/Treat Not Completed: Medical issues which prohibited therapy  Prior PT and OT notes indicate that medical team has decided to hold comprehensive PT evaluation until c-spine surgically stabilized. Will continue to follow and monitor for orders, post-op.  Please reach out to [facility] PT via secure chat if plan changes and patient needs seen prior to surgical intervention.   Kathlyn Sacramento, PT, DPT [facility]  Rehabilitation Services Physical Therapist Office: [phone] Website: [email or web address]    Berton Mount 04/05/2023, 9:44 AM

## 2023-04-05 NOTE — Progress Notes (Signed)
Daily Progress Note Intern Pager: [phone]  Patient name: [name] Medical record number: [id number] Date of birth: 1961/01/06 Age: 62 y.o. Gender: male  Primary Care Provider: Tiffany Kocher, DO Consultants: Neurosurgery Code Status: Full  Pt Overview and Major Events to Date:  [name] is a 62 y.o.male with a history of a-fib, seizure disorders, schizoaffective disorder, mdd who was admitted to the [facility] Teaching Service at [facility] for Altered Mental Status. His hospital course is detailed below:  Major Events to Date: 9/28 - Admitted for AMS, C5 fracture found on imaging 9/29 - Psych Consulted, decreased his medications 9/30 - Patient improved 10/3 - Neurosurgery Planned operation for C5 fixation  Assessment and Plan:  Assessment & Plan C5 cervical fracture (HCC) Continue with C collar, unstable injury per MRI. Neurosurgery via [facility].  - NSGY scheduling his surgery for Thursday afternoon 10/3 - Consulted them for their preferred VTE prophylaxis between now and then given their desire to not have eliquis on board before surgery. AMS (altered mental status) (Resolved: 04/05/2023) Pt was alert and oriented today x 4. He reported feeling better and thinking more clearly. He remembered events over the weekend and asked relevant questions about his course of hospitalization. - Add RPR? - Continue Neurochecks at Q4 - Psychiatry consult completed, they recommended reductions in his dosages of medicines affecting ACH-ase. Reductions have completed. T2DM (type 2 diabetes mellitus) (HCC) CBG checks + mSSI Elevated CK On IV fluids x 12 hours.  Encourage oral hydration Multiple falls Multiple falls in the setting of DISH.  Patient lives home alone and may not be able to sustain this. - PT/OT - Fall precautions -TOC consulted Hypothyroidism Pt has elevated TSH on admission. Pt has history of hypothyroidism, previously on 100 mcg levo. -  Increased to 125 mcg levothyroxine   Chronic and Stable Problems:  MDD: phenelzine 30 mg BID Schizoaffective Disorder: perphenazine 8 mg Q12, depakote 500 mg bid HLD: atorvastatin 40 mg daily Tremor: benztropine   FEN/GI: Heart healthy carb modified PPx: Eliquis  Dispo:Pending PT recommendations  pending clinical improvement . Barriers include continued work up for [employer] fx and AMS.     Subjective:  Pt reports he is doing well this morning, he was awake and oriented X4. He had questions about his future hospital course including what would be happening between now and his operation on Thursday. Told him that we would be doing our best to make sure that he stays safe while inpatient. He reports feeling better since he has been able to urinate more.  Objective: Temp:  [97.7 F (36.5 C)-98.8 F (37.1 C)] 98.5 F (36.9 C) (09/30 0320) Pulse Rate:  [88-99] 88 (09/30 0320) Resp:  [16-18] 16 (09/30 0317) BP: (123-153)/(73-94) 134/73 (09/30 0320) SpO2:  [91 %-98 %] 91 % (09/30 0320) Physical Exam: General: Ill-appearing man who appears his stated age. He was half-wearing his C-Collar which he finds uncomfortable. Cardiovascular: No murmurs, rubs or gallops appreciated on exam. Rate was regular. Respiratory: Could not auscultate patient's posterior lung fields since he was not wearing his C-Collar properly. Apical lung sounds normal, good air movement, no accessory work of breathing. Abdomen: Non-tender, non-distended. Extremities: Extremity movement still free, however limited the exam due to his need for relative immobility. Edema +1 on bilateral LE.  Laboratory: Most recent CBC Lab Results  Component Value Date   WBC 8.7 04/04/2023   HGB 13.9 04/04/2023   HCT 41.8 04/04/2023   MCV  90.7 04/04/2023   PLT 149 (L) 04/04/2023   Most recent BMP    Latest Ref Rng & Units 04/04/2023    5:36 AM  BMP  Glucose 70 - 99 mg/dL 865   BUN 8 - 23 mg/dL 12   Creatinine 7.84 - 1.24 mg/dL  6.96   Sodium 295 - 284 mmol/L 137   Potassium 3.5 - 5.1 mmol/L 4.1   Chloride 98 - 111 mmol/L 102   CO2 22 - 32 mmol/L 20   Calcium 8.9 - 10.3 mg/dL 8.7    Lab Results  Component Value Date   TSH 10.182 (H) 04/04/2023     Imaging/Diagnostic Tests: No interval exams.  Margaretmary Dys, MD 04/05/2023, 8:04 AM  PGY-1, [facility] FPTS Intern pager: [phone], text pages welcome Secure chat group [facility] Teaching Service

## 2023-04-06 DIAGNOSIS — R410 Disorientation, unspecified: Secondary | ICD-10-CM | POA: Diagnosis not present

## 2023-04-06 HISTORY — DX: Disorientation, unspecified: R41.0

## 2023-04-06 LAB — GLUCOSE, CAPILLARY
Glucose-Capillary: 88 mg/dL (ref 70–99)
Glucose-Capillary: 136 mg/dL — ABNORMAL HIGH (ref 70–99)
Glucose-Capillary: 118 mg/dL — ABNORMAL HIGH (ref 70–99)
Glucose-Capillary: 107 mg/dL — ABNORMAL HIGH (ref 70–99)

## 2023-04-06 MED ORDER — ACETAMINOPHEN 325 MG PO TABS
650.0000 mg | ORAL_TABLET | Freq: Four times a day (QID) | ORAL | Status: DC
  Administered 2023-04-06 – 2023-04-12 (×20): 650 mg via ORAL
  Filled 2023-04-06 (×21): qty 2

## 2023-04-06 NOTE — Plan of Care (Signed)
  Problem: Education: Goal: Ability to describe self-care measures that may prevent or decrease complications (Diabetes Survival Skills Education) will improve Outcome: Progressing Goal: Individualized Educational Video(s) Outcome: Progressing   Problem: Education: Goal: Ability to describe self-care measures that may prevent or decrease complications (Diabetes Survival Skills Education) will improve Outcome: Progressing Goal: Individualized Educational Video(s) Outcome: Progressing   Problem: Education: Goal: Ability to describe self-care measures that may prevent or decrease complications (Diabetes Survival Skills Education) will improve Outcome: Progressing Goal: Individualized Educational Video(s) Outcome: Progressing

## 2023-04-06 NOTE — Assessment & Plan Note (Addendum)
On IV fluids x 12 hours.  Encourage oral hydration. CK: 846 on 9/28.  - Patient PO intake is improved

## 2023-04-06 NOTE — Assessment & Plan Note (Signed)
As above, suspect likely chronic. Will monitor patient closely. -Admit to [facility] Medicine Teaching Service, attending Dr. Deirdre Priest, Med-Surg -F/u UA, TSH, HIV -Neuro checks q4h -Continue patient's psychiatric medications as below -AM BMP, CBC

## 2023-04-06 NOTE — Progress Notes (Signed)
  NEUROSURGERY PROGRESS NOTE   No issues overnight. Pt without c/o, unchanged neck pain. No new N/T/W.  EXAM:  BP (!) 130/94 (BP Location: Left Arm)   Pulse 91   Temp 98.3 F (36.8 C) (Oral)   Resp 18   Ht 5\' 9"  (1.753 m)   Wt 96.9 kg   SpO2 96%   BMI 31.55 kg/m   Awake, alert, oriented  Speech fluent, appropriate  CN grossly intact  5/5 BUE/BLE   IMPRESSION:  62 y.o. male with unstable C5-6 hyperextension fracture with DISH requiring operative stabilization.  PLAN: - Cont to hold Eliquis - Ok for DVT prophylaxis, hold on Thursday am. - Planning on C5-6 ACDF on Thursday afternoon   Lisbeth Renshaw, MD [facility]

## 2023-04-06 NOTE — Assessment & Plan Note (Signed)
CBG checks + mSSI. Glucoses 101-150 over last 24h. Received 4u insulin aspart yesterday. - Continue CBG checks, MSSI.

## 2023-04-06 NOTE — Assessment & Plan Note (Signed)
Pt has elevated TSH to 10 on admission. Pt has history of hypothyroidism, previously on 100 mcg levo. Increased to 125 mcg levothyroxine 10/1. - Denies new palpitations.

## 2023-04-06 NOTE — Progress Notes (Addendum)
Daily Progress Note Intern Pager: [phone]  Patient name: [name] Medical record number: [id number] Date of birth: [birth year] Age: 62 y.o. Gender: male  Primary Care Provider: Tiffany Kocher, DO Consultants: Neurosurgery, psychiatry (signed off) Code Status: Full  Pt Overview and Major Events to Date:  [name] is a 62 year old male with a history of atrial fibrillation, seizure disorders, schizoaffective disorder and MDD who was admitted to the FMTS at [facility] health for Altered mental status, C5 fracture found on imaging.  We are essentially monitoring patient until scheduled C-spine stabilization 10/3. 9/28 - Admitted for AMS, C5 fracture found on imaging 9/29 - Psych Consulted, decreased his medications 9/30 - Patient improved 10/3 - Neurosurgery Planned operation for C5 fixation  Assessment and Plan: Assessment & Plan C5 cervical fracture (HCC) Continue with C collar, unstable injury per MRI. Neurosurgery via [facility].  Noted some pain today, moderately well-controlled on as needed Tylenol - NSGY scheduling his surgery for Thursday afternoon 10/3 - Changed PRN Tylenol 650 mg every 6 hours to scheduled Tylenol 650 mg every 6 hours to better control pain - On 5000u Heparin q8H until midnight 10/3. - NPO at midnight 10/3 T2DM (type 2 diabetes mellitus) (HCC) CBG checks + mSSI. Glucoses 101-150 over last 24h. Received 4u insulin aspart yesterday. - Continue CBG checks, MSSI. Elevated CK On IV fluids x 12 hours.  Encourage oral hydration. CK: 846 on 9/28.  - Patient PO intake is improved Multiple falls Multiple falls in the setting of DISH.  Patient lives home alone and may not be able to sustain this. - PT/OT will defer until C-spine s/p stabilization 10/3. - Fall precautions. - TOC will follow.  Hypothyroidism Pt has elevated TSH to 10 on admission. Pt has history of hypothyroidism, previously on 100 mcg levo. Increased to 125 mcg  levothyroxine 10/1. - Denies new palpitations. Disorientation Is alert and oriented to self, but not to hospital, date, or year. Apparently improved after reduction of anti-cholinergic psychiatric medications. BMP/CBC 10/1 unremarkable. HIV negative. Patient reoriented and assured.  - Neuro checks q4h - Ordered RPR/B12 to rule out reversible causes of AMS. - Continue patient's psychiatric medications as below  Chronic and Stable Issues: MDD: phenelzine 30 mg BID Schizoaffective Disorder: perphenazine 8 mg Q12, depakote 500 mg bid HLD: atorvastatin 40 mg daily Tremor: benztropine  FEN/GI: Heart healthy carb modified  PPx: Heparin 5000u q8h Dispo:Pending PT recommendations pending clinical improvement . Barriers include continued work up for [employer] fx and AMS.   Subjective:  On interview, patient is sitting up in chair having bed cleaned.  No new changes today.  Patient is alert and oriented to self, but not to year, month, situation.  Patient was reassured and reoriented.  Per chart review, he appears less oriented than yesterday.  No palpitations in setting of increased levothyroxine dose.  Some minimal back pain.  Patient was informed that he could ask staff if he was worried or had a question.  On collateral call to mother [name] [phone]: Mother picked up.  [place] Spaniel that they had been considering finding assisted living facility for patient.  Had fun set aside to for patient private room.  Asked if he would be discharged directly to ALF or if he would first need rehab.  Discussed that we would have a better idea after patient surgery Thursday.  Brother and daughter-in-law will come to visit today.  Brother is in control of patient's special needs funds.  Patient  has had multiple falls, could could be contributing to his presentation.  Will call again to update tomorrow.  Objective:  BP: 122/73 (24h max 146/94). HR: 83. RR: 18. 95% on RA. Afebrile.   Physical Exam: General:  Anxious appearing man in no acute distress sitting up in chair, C-spine collar placed Cardiovascular: Regular rate.  No murmurs rubs or gallops. Respiratory: CTAB Abdomen: Nontender, nondistended Extremities: +/- Trace pitting edema on bilateral lower extremities, persistent tremor of right upper extremity   Laboratory: Most recent CBC Lab Results  Component Value Date   WBC 9.6 04/05/2023   HGB 13.1 04/05/2023   HCT 39.6 04/05/2023   MCV 88.4 04/05/2023   PLT 139 (L) 04/05/2023   Most recent BMP    Latest Ref Rng & Units 04/05/2023    7:50 AM  BMP  Glucose 70 - 99 mg/dL 161   BUN 8 - 23 mg/dL 8   Creatinine 0.96 - 0.45 mg/dL 4.09   Sodium 811 - 914 mmol/L 136   Potassium 3.5 - 5.1 mmol/L 3.6   Chloride 98 - 111 mmol/L 98   CO2 22 - 32 mmol/L 26   Calcium 8.9 - 10.3 mg/dL 8.6     Imaging/Diagnostic Tests: No new labs   Tomie China, MD 04/06/2023, 10:50 AM  PGY-1, [place] [facility] FPTS Intern pager: [phone], text pages welcome Secure chat group [facility] Teaching Service

## 2023-04-06 NOTE — Plan of Care (Signed)
  Problem: Education: Goal: Ability to describe self-care measures that may prevent or decrease complications (Diabetes Survival Skills Education) will improve Outcome: Progressing Goal: Individualized Educational Video(s) Outcome: Progressing   Problem: Coping: Goal: Ability to adjust to condition or change in health will improve Outcome: Progressing   Problem: Fluid Volume: Goal: Ability to maintain a balanced intake and output will improve Outcome: Progressing   Problem: Health Behavior/Discharge Planning: Goal: Ability to identify and utilize available resources and services will improve Outcome: Progressing Goal: Ability to manage health-related needs will improve Outcome: Progressing   Problem: Metabolic: Goal: Ability to maintain appropriate glucose levels will improve Outcome: Progressing   Problem: Nutritional: Goal: Maintenance of adequate nutrition will improve Outcome: Progressing Goal: Progress toward achieving an optimal weight will improve Outcome: Progressing   Problem: Skin Integrity: Goal: Risk for impaired skin integrity will decrease Outcome: Progressing   Problem: Tissue Perfusion: Goal: Adequacy of tissue perfusion will improve Outcome: Progressing   Problem: Clinical Measurements: Goal: Ability to maintain clinical measurements within normal limits will improve Outcome: Progressing Goal: Will remain free from infection Outcome: Progressing Goal: Diagnostic test results will improve Outcome: Progressing Goal: Respiratory complications will improve Outcome: Progressing Goal: Cardiovascular complication will be avoided Outcome: Progressing   Problem: Pain Managment: Goal: General experience of comfort will improve Outcome: Progressing   Problem: Safety: Goal: Ability to remain free from injury will improve Outcome: Progressing

## 2023-04-06 NOTE — Assessment & Plan Note (Addendum)
Is alert and oriented to self, but not to hospital, date, or year. Apparently improved after reduction of anti-cholinergic psychiatric medications. BMP/CBC 10/1 unremarkable. HIV negative. Patient reoriented and assured.  - Neuro checks q4h - Ordered RPR/B12 to rule out reversible causes of AMS. - Continue patient's psychiatric medications as below

## 2023-04-06 NOTE — Assessment & Plan Note (Addendum)
Continue with C collar, unstable injury per MRI. Neurosurgery via [facility].  Noted some pain today, moderately well-controlled on as needed Tylenol - NSGY scheduling his surgery for Thursday afternoon 10/3 - Changed PRN Tylenol 650 mg every 6 hours to scheduled Tylenol 650 mg every 6 hours to better control pain - On 5000u Heparin q8H until midnight 10/3. - NPO at midnight 10/3

## 2023-04-06 NOTE — Assessment & Plan Note (Signed)
Multiple falls in the setting of DISH.  Patient lives home alone and may not be able to sustain this. - PT/OT will defer until C-spine s/p stabilization 10/3. - Fall precautions. - TOC will follow.

## 2023-04-07 DIAGNOSIS — R410 Disorientation, unspecified: Secondary | ICD-10-CM | POA: Diagnosis not present

## 2023-04-07 LAB — TROPONIN I (HIGH SENSITIVITY)
Troponin I (High Sensitivity): 10 ng/L (ref <18)
Troponin I (High Sensitivity): 8 ng/L (ref <18)

## 2023-04-07 LAB — CBC
HCT: 38.9 % — ABNORMAL LOW (ref 39.0–52.0)
Hemoglobin: 13.4 g/dL (ref 13.0–17.0)
MCH: 30.6 pg (ref 26.0–34.0)
MCHC: 34.4 g/dL (ref 30.0–36.0)
MCV: 88.8 fL (ref 80.0–100.0)
Platelets: 142 10*3/uL — ABNORMAL LOW (ref 150–400)
RBC: 4.38 MIL/uL (ref 4.22–5.81)
RDW: 15.5 % (ref 11.5–15.5)
WBC: 8.8 10*3/uL (ref 4.0–10.5)
nRBC: 0 % (ref 0.0–0.2)

## 2023-04-07 LAB — GLUCOSE, CAPILLARY
Glucose-Capillary: 125 mg/dL — ABNORMAL HIGH (ref 70–99)
Glucose-Capillary: 92 mg/dL (ref 70–99)
Glucose-Capillary: 157 mg/dL — ABNORMAL HIGH (ref 70–99)
Glucose-Capillary: 118 mg/dL — ABNORMAL HIGH (ref 70–99)

## 2023-04-07 LAB — RPR: RPR Ser Ql: NONREACTIVE

## 2023-04-07 LAB — VITAMIN B12: Vitamin B-12: 578 pg/mL (ref 180–914)

## 2023-04-07 MED ORDER — DIVALPROEX SODIUM ER 500 MG PO TB24
1000.0000 mg | ORAL_TABLET | Freq: Every day | ORAL | Status: DC
  Administered 2023-04-09 – 2023-04-18 (×10): 1000 mg via ORAL
  Filled 2023-04-07 (×13): qty 2

## 2023-04-07 MED ORDER — PERPHENAZINE 4 MG PO TABS
8.0000 mg | ORAL_TABLET | Freq: Two times a day (BID) | ORAL | Status: AC
  Administered 2023-04-07: 8 mg via ORAL
  Filled 2023-04-07: qty 2

## 2023-04-07 MED ORDER — PERPHENAZINE 4 MG PO TABS
16.0000 mg | ORAL_TABLET | Freq: Every day | ORAL | Status: DC
  Administered 2023-04-09 – 2023-04-18 (×10): 16 mg via ORAL
  Filled 2023-04-07 (×2): qty 4
  Filled 2023-04-07: qty 1
  Filled 2023-04-07 (×10): qty 4

## 2023-04-07 MED ORDER — DIVALPROEX SODIUM ER 500 MG PO TB24
500.0000 mg | ORAL_TABLET | Freq: Two times a day (BID) | ORAL | Status: AC
  Administered 2023-04-07: 500 mg via ORAL
  Filled 2023-04-07: qty 1

## 2023-04-07 MED ORDER — CALCIUM CARBONATE ANTACID 500 MG PO CHEW: 1.0000 | CHEWABLE_TABLET | Freq: Every day | ORAL | Status: DC | PRN

## 2023-04-07 MED ORDER — CALCIUM CARBONATE ANTACID 500 MG PO CHEW
1.0000 | CHEWABLE_TABLET | Freq: Every day | ORAL | Status: DC
  Administered 2023-04-07: 200 mg via ORAL
  Filled 2023-04-07: qty 1

## 2023-04-07 MED ORDER — BENZTROPINE MESYLATE 0.5 MG PO TABS
0.5000 mg | ORAL_TABLET | Freq: Every day | ORAL | Status: DC
  Administered 2023-04-09 – 2023-04-18 (×10): 0.5 mg via ORAL
  Filled 2023-04-07 (×13): qty 1

## 2023-04-07 NOTE — Assessment & Plan Note (Signed)
CBG checks + mSSI.  Glucoses in the low 100s over the last 24 hours.  Required 6 units of sliding scale insulin aspart. - Continue CBG checks, MSSI.

## 2023-04-07 NOTE — Assessment & Plan Note (Addendum)
Continue with C collar, unstable injury per MRI. Neurosurgery via [facility]. Noted 5/10 neck pain today, uncontrolled on scheduled tylenol.  - NSGY scheduling his surgery for Thursday afternoon 10/3 - Continue Tylenol 650 mg every 6 hours. - Subcutaneous 5000u Heparin q8H.  D/C  @2200 . - NPO at midnight 10/3

## 2023-04-07 NOTE — Assessment & Plan Note (Addendum)
Is alert and oriented to self, but not to hospital, date, or year. Per chart review, patient orientation generally appears worse after reduction of psych meds. Will reach out to psych to re-consult. B12. RPR negative. Brother visited yesterday, will follow up with family today to determine if patient was at baseline during visit.  - Neuro checks q4h. - Continue patient's psychiatric medications as below

## 2023-04-07 NOTE — Progress Notes (Signed)
Daily Progress Note Intern Pager: [phone]  Patient name: [name] Medical record number: [id number] Date of birth: [birth year] Age: 62 y.o. Gender: male  Primary Care Provider: Tiffany Kocher, DO Consultants: Neurosurgery Code Status: Full  Pt Overview and Major Events to Date:  [name] is a 62 year old male with a history of atrial fibrillation, seizure disorder, schizoaffective disorder, and MDD who was admitted to the FM PS at [facility] health for altered mental status.  C5 fracture found on imaging.  Scheduled for C-spine stabilization with neurosurgery 10/3.  9/28: Admitted for AMS.  C5 fracture found on imaging.  C-spine collar placed. 9/29: Psych consulted, decreased his home psych medications to reduce anticholinergic side effects. 9/30: Patient mentation improved.  Begun on heparin 5000 units every 8 hours. 10/1: Reversible causes of dementia (B12, RPR).  B12 negative. 10/2: No acute events overnight. Noted 5/10 neck pain. Noted chest pain. Troponin/EKG ordered.  10/3: C-spine stabilization plan. Assessment & Plan Chest pain Developed off-and-on chest pain overnight. Reminds him of heartburn. Difficulty describing pain. Repeat STAT EKG showed Afib. Unlikely ACS, but will check troponins to be sure. Otherwise, consider prescribing TUMS.  - Awaiting troponins. C5 cervical fracture (HCC) Continue with C collar, unstable injury per MRI. Neurosurgery via [facility]. Noted 5/10 neck pain today, uncontrolled on scheduled tylenol.  - NSGY scheduling his surgery for Thursday afternoon 10/3 - Continue Tylenol 650 mg every 6 hours. - Subcutaneous 5000u Heparin q8H.  D/C  @2200 . - NPO at midnight 10/3 Disorientation Is alert and oriented to self, but not to hospital, date, or year. Per chart review, patient orientation generally appears worse after reduction of psych meds. Will reach out to psych to re-consult. B12. RPR negative. Brother visited  yesterday, will follow up with family today to determine if patient was at baseline during visit.  - Neuro checks q4h. - Continue patient's psychiatric medications as below Multiple falls Multiple falls in the setting of DISH.  Patient lives home alone and may not be able to sustain this.  On conversation with mother, family has already begun discussing options for assisted living, as they do not believe that he is safe at home anymore. Patient knows that he has fallen multiple times.  - PT/OT will defer until C-spine s/p stabilization 10/3. - Fall precautions. - TOC will follow.  T2DM (type 2 diabetes mellitus) (HCC) CBG checks + mSSI.  Glucoses in the low 100s over the last 24 hours.  Required 6 units of sliding scale insulin aspart. - Continue CBG checks, MSSI. Hypothyroidism Pt has elevated TSH to 10 on admission. Pt has history of hypothyroidism, previously on 100 mcg levo. Increased to 125 mcg levothyroxine 10/1. - Denies new palpitations. Elevated CK On IV fluids x 12 hours.  Encourage oral hydration. CK: 846 on 9/28.  - Patient PO intake is improved  Chronic and Stable Issues: MDD: Phenelzine 30 mg BID Schizoaffective Disorder: Perphenazine 8 mg Q12, depakote 500 mg bid. HLD: Continue atorvastatin 40 mg daily. Tremor: Continue benztropine.  FEN/GI: Heart healthy carb modify diet. PPx: Heparin 5000 units every 8 H.  Discontinue @2200  on 10/2. Dispo: Pending PT/OT recs.  Barriers include: C-spine fixation and family perception that he is not safe at home alone anymore.  Subjective:  No acute events overnight.  On interview, patient noted new chest pain, which is similar to heartburn that he's had in the past. Ordered STAT troponins/EKG. Reminds him of previous heartburn. Discussed TUMS prescription. Ordered  troponins/EKG in case. Noted chronic 5/10 back pain. Discussed additional pain control. Oriented to self and place, but not to year or situation. Unclear if this is at baseline.  Is excited to get out of the hospital so that he can play cards with his mother.   Objective:  BP: 148/99.  BP max: 156/96 overnight.  HR: 91.  RR: 20.  Afebrile.  Satting 93% on room air.  Physical Exam: General: NAD, sitting up in chair, eating breakfast.  Cardiovascular: Difficulty auscultating 2/2 cervical collar.  Respiratory: CTAB Abdomen: BS+, no tenderness in 4 quadrants Extremities: LEE non-edematous  Laboratory: Most recent CBC Lab Results  Component Value Date   WBC 8.8 04/07/2023   HGB 13.4 04/07/2023   HCT 38.9 (L) 04/07/2023   MCV 88.8 04/07/2023   PLT 142 (L) 04/07/2023   Most recent BMP    Latest Ref Rng & Units 04/05/2023    7:50 AM  BMP  Glucose 70 - 99 mg/dL 161   BUN 8 - 23 mg/dL 8   Creatinine 0.96 - 0.45 mg/dL 4.09   Sodium 811 - 914 mmol/L 136   Potassium 3.5 - 5.1 mmol/L 3.6   Chloride 98 - 111 mmol/L 98   CO2 22 - 32 mmol/L 26   Calcium 8.9 - 10.3 mg/dL 8.6     Other pertinent labs: BGs the last 24 hours: 107-136. Vitamin B12: 578 within normal limits. RPR: Awaiting.  Imaging/Diagnostic Tests: No new imaging.  Tomie China, MD 04/07/2023, 7:37 AM  PGY-1, [facility] FPTS Intern pager: [phone], text pages welcome Secure chat group [facility] Teaching Service

## 2023-04-07 NOTE — Progress Notes (Signed)
Patient's bladder scan noted 592 ml. Writer informed Tomie China, MD. Writer advised to bladder scan patient in 4 hrs, per MD. Will monitor.

## 2023-04-07 NOTE — Assessment & Plan Note (Signed)
Developed off-and-on chest pain overnight. Reminds him of heartburn. Difficulty describing pain. Repeat STAT EKG showed Afib. Unlikely ACS, but will check troponins to be sure. Otherwise, consider prescribing TUMS.  - Awaiting troponins.

## 2023-04-07 NOTE — Assessment & Plan Note (Addendum)
Multiple falls in the setting of DISH.  Patient lives home alone and may not be able to sustain this.  On conversation with mother, family has already begun discussing options for assisted living, as they do not believe that he is safe at home anymore. Patient knows that he has fallen multiple times.  - PT/OT will defer until C-spine s/p stabilization 10/3. - Fall precautions. - TOC will follow.

## 2023-04-07 NOTE — Consult Note (Signed)
[name]   Service Date: April 07, 2023 LOS:  LOS: 4 days    Assessment  [name] is a 62 y.o. male admitted medically for 04/03/2023  4:28 PM for recurrent false with resulting C5 fracture. He carries the psychiatric diagnoses of MDD, Anxiety and distant dx of schizoaffective d/o and OCD and has a past medical history of  tremor.Psychiatry was consulted for "worsening AMS w/o obvious delirium" by Tomie China, MD.    His current presentation of waxing and waning confusion is most consistent with delirium. He meets criteria for delirium based on presentation. Current outpatient psychotropic medications include phenelzine 45mg  bid, depakote 1000qhs, trilafon 24 at bedtime,a and cogentin 1 bid and historically he has had a good response to these medications. He was to his knowledge compliant with medications prior to admission as evidenced by patient reporting that he was taking them and putting them in pill boxes himself. On initial examination, patient discussed the concern for the patient's altered mental status, and also tremors, that are believed to be side effects to his current outpatient psychiatric medication. Initially the patient stated that he understands this, and that has these medications have provided him psychiatric stability for sometime. He had some recollection of disorientation prior to admission. He also was aware of the falls that preceded the admission.   Per chart patient has signs of chronic brain atrophy and there has been documented concerns regarding "memory, tremors, vertigo , and shakes." While some of patient's presentation could be 2/2 to noted atrophy and chronic small vessel dx changes on CT; however, patient's medication regimen could also be contributing. There was some noted improvement with decreasing his Cogentin, phenelzine, and perphenazine however, these medication adjustments can only  be made slowly as rapid discontinuation or titration could worsen presentation causing withdrawal side effects. Will attempt to consolidate medication to night( as they were in his home regimen) to help minimize sedating side effects, that patient is endorsing.   Please see plan below for detailed recommendations.   Diagnoses:  Active Hospital problems: Principal Problem:   C5 cervical fracture (HCC) Active Problems:   T2DM (type 2 diabetes mellitus) (HCC)   Hypothyroidism   Chest pain   Elevated CK   Multiple falls   Disorientation     Plan  ## Safety and Observation Level:  - Based on my clinical Trujillo, I estimate the patient to be at low risk of self harm in the current setting - At this time, we recommend a routine level of observation. This decision is based on my review of the chart including patient's history and current presentation, interview of the patient, mental status examination, and consideration of suicide risk including evaluating suicidal ideation, plan, intent, suicidal or self-harm behaviors, risk factors, and protective factors. This judgment is based on our ability to directly address suicide risk, implement suicide prevention strategies and develop a safety plan while the patient is in the clinical setting. Please contact our team if there is a concern that risk level has changed.   ## Medications:  -- Continue phenelzine 30mg  bid -- Change Depakote 500mg  bid to 1000qhs -- Change cogentin 0.5mg  daily to 0.5mg  at bedtime -- Change trilafon 8mg  bid to 16mg  qhs  ## Medical Decision Making Capacity:  Not formally assessed  ## Further Work-up:  -- Per primary    -- most recent EKG on 04/07/2023 had QtC of 440 w/  A  fib. -- Pertinent labwork reviewed  earlier this admission includes:     Latest Ref Rng & Units 04/05/2023    7:50 AM 04/04/2023    5:36 AM 04/03/2023    6:22 PM  BMP  Glucose 70 - 99 mg/dL 161  096  045   BUN 8 - 23 mg/dL 8  12  15     Creatinine 0.61 - 1.24 mg/dL 4.09  8.11  9.14   Sodium 135 - 145 mmol/L 136  137  139   Potassium 3.5 - 5.1 mmol/L 3.6  4.1  3.3   Chloride 98 - 111 mmol/L 98  102  99   CO2 22 - 32 mmol/L 26  20    Calcium 8.9 - 10.3 mg/dL 8.6  8.7      TSH: 78.295 B12: WNL  ## Disposition:  -- to be determined  ## Behavioral / Environmental:  -- DELIRIUM RECS 1: Avoid benzodiazepines, antihistamines, anticholinergics, and minimize opiate use as these may worsen delirium. 2:Assess, prevent and manage pain as lack of treatment can result in delirium.  3: Recommend consult to PT/OT if not already done. Early mobility and exercise has been shown to decrease duration of delirium.  4:Provide appropriate lighting and clear signage; a clock and calendar should be easily visible to the patient. 5:Monitor environmental factors. Reduce light and noise at night (close shades, turn off lights, turn off TV, ect). Correct any alterations in sleep cycle. 6: Reorient the patient to person, place, time and situation on each encounter.  7: Correct sensory deficits if possible (replace eye glasses, hearing aids, ect). 8: Avoid restraints. Severely delirious patients benefit from constant observation by a sitter. 9: Do not leave patient unattended.     ##Legal Status   Thank you for this consult request. Recommendations have been communicated to the primary team.  We will continue to follow at this time.   Bobbye Morton, MD    followup history  Relevant Aspects of Hospital Course:  Admitted on 04/03/2023 for fall with C5 fracture. [name] is a 62 yo male p/f encephalopathy of unknown origin in setting of recurrent falls. Patient has a h/o MDD, Anxiety and possible hx of schizoaffective d/o on multiple antipsychotics with high side effect profile.  Patient Report:    On assessment today patient is only oriented to name. Patient reports that he believes he is at [facility] in [place]. Patient reports he  lives in [place], and when reoriented to the hospital, he was not able to give the correct city for [facility]. Patient was not oriented to time. Patient had difficulty recalling the reason he was hospitalized and appeared to make his best guess, by assuming it was for depression. Patient struggled to recall why he was wearing a neck brace but was slowly recalled a "trauma" and then said he had been falling a lot. Patient endorses being pleased that he was having a surgery on his neck tom. Patient reported than in regards to his mood he had noticed he was sleeping a lot more. He also reported that his mood was low and that taking a  shower might help. He denied SI,HI, and AVH. Patient denied feeling irritable currently.   Additional test on concentration: Patient was not able to do DOWB or count backwards from 15 to 8 and struggled even with immediate recall of the instructions. For 15 to 8, after multiple attempts to have patient start; patient response was "15, 12, 12."   Psychiatric History:  Information collected from EMR  Reports a history of diagnosis of depression, schizoaffective disorder, OCD.  He reports a history of multiple psychiatric hospitalizations, and the last hospitalization he states was in 1985.  He denies any history of suicide attempts.   Family Hx:  The patient's family history includes Diabetes in his brother and father; Heart disease in his father; OCD in his mother.  Medical History: Past Medical History:  Diagnosis Date   A-fib [facility])    Acute right-sided low back pain    Arrhythmia    Chronic a-fib (HCC)    Closed fracture of left distal radius    Contact dermatitis 04/16/2017   Depression    Depression    Hypothyroidism    Low back pain    Morbid obesity, BMI unknown (HCC)    Osteoarthritis    oa in bilateral knees   Schizophrenia (HCC)    Seizures (HCC)    Thyroid disease    Varicose veins     Surgical History: Past Surgical History:  Procedure  Laterality Date   ACHILLES TENDON REPAIR  approx. 2004   right foot   CATARACT EXTRACTION     bilateral   OPEN REDUCTION INTERNAL FIXATION (ORIF) DISTAL RADIAL FRACTURE Left 07/21/2018   Procedure: OPEN REDUCTION INTERNAL FIXATION (ORIF) DISTAL RADIAL FRACTURE;  Surgeon: Betha Loa, MD;  Location: [facility];  Service: Orthopedics;  Laterality: Left;   stab phlebectomy  Right 11/09/2016   stab phlebectomy R leg by Josephina Gip MD   TOOTH EXTRACTION      Medications:   Current Facility-Administered Medications:    acetaminophen (TYLENOL) tablet 650 mg, 650 mg, Oral, Q6H, Tomie China, MD, 650 mg at 04/07/23 1539   atorvastatin (LIPITOR) tablet 40 mg, 40 mg, Oral, Daily, Celine Mans, MD, 40 mg at 04/07/23 0912   [START ON 04/08/2023] benztropine (COGENTIN) tablet 0.5 mg, 0.5 mg, Oral, QHS, Sari Cogan B, MD   calcium carbonate (TUMS - dosed in mg elemental calcium) chewable tablet 200 mg of elemental calcium, 1 tablet, Oral, Daily PRN, Tomie China, MD   Melene Muller ON 04/08/2023] divalproex (DEPAKOTE ER) 24 hr tablet 1,000 mg, 1,000 mg, Oral, QHS, Rylynn Kobs B, MD   divalproex (DEPAKOTE ER) 24 hr tablet 500 mg, 500 mg, Oral, BID, Arnulfo Batson B, MD   heparin injection 5,000 Units, 5,000 Units, Subcutaneous, Q8H, Lincoln Brigham, MD, 5,000 Units at 04/07/23 1301   insulin aspart (novoLOG) injection 0-15 Units, 0-15 Units, Subcutaneous, TID WC, Celine Mans, MD, 2 Units at 04/07/23 0911   levothyroxine (SYNTHROID) tablet 125 mcg, 125 mcg, Oral, Daily, Jena Gauss, Allee, MD, 125 mcg at 04/07/23 0501   Oral care mouth rinse, 15 mL, Mouth Rinse, PRN, Carney Living, MD   [START ON 04/08/2023] perphenazine (TRILAFON) tablet 16 mg, 16 mg, Oral, QHS, Kyion Gautier B, MD   perphenazine (TRILAFON) tablet 8 mg, 8 mg, Oral, Q12H, Latresa Gasser B, MD   phenelzine (NARDIL) tablet 30 mg, 30 mg, Oral, BID, Massengill, Nathan, MD, 30 mg at 04/07/23 0912   polyethylene  glycol (MIRALAX / GLYCOLAX) packet 34 g, 34 g, Oral, Daily, Margaretmary Dys, MD, 34 g at 04/07/23 0912   senna-docusate (Senokot-S) tablet 1 tablet, 1 tablet, Oral, QHS, Maxwell, Allee, MD, 1 tablet at 04/06/23 2132  Allergies: Allergies  Allergen Reactions   Lamictal [Lamotrigine] Rash   Metformin And Related Rash       Objective  Vital signs:  Temp:  [97.8 F (36.6 C)-99.5 F (37.5 C)] 98.5 F (36.9  C) (10/02 1512) Pulse Rate:  [76-91] 84 (10/02 1512) Resp:  [18-20] 18 (10/02 1512) BP: (109-156)/(65-99) 109/65 (10/02 1512) SpO2:  [93 %-98 %] 94 % (10/02 1512)  Psychiatric Specialty Exam:  Presentation  General Appearance:  Appropriate for Environment  Eye Contact: Good  Speech: Normal Rate  Speech Volume: Normal  Handedness:No data recorded  Mood and Affect  Mood: Euthymic  Affect: Full Range   Thought Process  Thought Processes: Goal Directed  Descriptions of Associations:Intact  Orientation:Partial (to self only)  Thought Content:Logical  History of Schizophrenia/Schizoaffective disorder:No data recorded Duration of Psychotic Symptoms:No data recorded Hallucinations:Hallucinations: None  Ideas of Reference:None  Suicidal Thoughts:Suicidal Thoughts: No  Homicidal Thoughts:Homicidal Thoughts: No   Sensorium  Memory: Immediate Poor; Recent Poor  Judgment: Fair  Insight: Shallow   Executive Functions  Concentration: Poor  Attention Span: Poor  Recall:Poor  Fund of Knowledge:Poor  Language:Fair   Psychomotor Activity  Psychomotor Activity:Psychomotor Activity: Psychomotor Retardation   Assets  Assets:Resilience; Social Support; Housing; Desire for Improvement   Sleep  Sleep:Sleep: Fair    Physical Exam: Physical Exam Review of Systems  Psychiatric/Behavioral:  Negative for hallucinations and suicidal ideas.    Blood pressure 109/65, pulse 84, temperature 98.5 F (36.9 C), temperature source  Oral, resp. rate 18, height 5\' 9"  (1.753 m), weight 96.9 kg, SpO2 94%. Body mass index is 31.55 kg/m.

## 2023-04-07 NOTE — Plan of Care (Signed)
  Problem: Pain Managment: Goal: General experience of comfort will improve Outcome: Progressing   Problem: Safety: Goal: Ability to remain free from injury will improve Outcome: Progressing   

## 2023-04-07 NOTE — Plan of Care (Signed)
Problem: Education: Goal: Ability to describe self-care measures that may prevent or decrease complications (Diabetes Survival Skills Education) will improve Outcome: Progressing Goal: Individualized Educational Video(s) Outcome: Progressing   Problem: Coping: Goal: Ability to adjust to condition or change in health will improve Outcome: Progressing   Problem: Fluid Volume: Goal: Ability to maintain a balanced intake and output will improve Outcome: Progressing   Problem: Health Behavior/Discharge Planning: Goal: Ability to identify and utilize available resources and services will improve Outcome: Progressing Goal: Ability to manage health-related needs will improve Outcome: Progressing   Problem: Metabolic: Goal: Ability to maintain appropriate glucose levels will improve Outcome: Progressing   Problem: Nutritional: Goal: Maintenance of adequate nutrition will improve Outcome: Progressing Goal: Progress toward achieving an optimal weight will improve Outcome: Progressing   Problem: Skin Integrity: Goal: Risk for impaired skin integrity will decrease Outcome: Progressing   Problem: Tissue Perfusion: Goal: Adequacy of tissue perfusion will improve Outcome: Progressing   Problem: Education: Goal: Knowledge of General Education information will improve Description: Including pain rating scale, medication(s)/side effects and non-pharmacologic comfort measures Outcome: Progressing   Problem: Health Behavior/Discharge Planning: Goal: Ability to manage health-related needs will improve Outcome: Progressing   Problem: Clinical Measurements: Goal: Ability to maintain clinical measurements within normal limits will improve Outcome: Progressing Goal: Will remain free from infection Outcome: Progressing Goal: Diagnostic test results will improve Outcome: Progressing Goal: Respiratory complications will improve Outcome: Progressing Goal: Cardiovascular complication will  be avoided Outcome: Progressing   Problem: Activity: Goal: Risk for activity intolerance will decrease Outcome: Progressing   Problem: Nutrition: Goal: Adequate nutrition will be maintained Outcome: Progressing   Problem: Coping: Goal: Level of anxiety will decrease Outcome: Progressing   Problem: Elimination: Goal: Will not experience complications related to bowel motility Outcome: Progressing Goal: Will not experience complications related to urinary retention Outcome: Progressing   Problem: Pain Managment: Goal: General experience of comfort will improve Outcome: Progressing   Problem: Safety: Goal: Ability to remain free from injury will improve Outcome: Progressing   Problem: Skin Integrity: Goal: Risk for impaired skin integrity will decrease Outcome: Progressing   Problem: Education: Goal: Ability to describe self-care measures that may prevent or decrease complications (Diabetes Survival Skills Education) will improve Outcome: Progressing Goal: Individualized Educational Video(s) Outcome: Progressing   Problem: Coping: Goal: Ability to adjust to condition or change in health will improve Outcome: Progressing   Problem: Fluid Volume: Goal: Ability to maintain a balanced intake and output will improve Outcome: Progressing   Problem: Health Behavior/Discharge Planning: Goal: Ability to identify and utilize available resources and services will improve Outcome: Progressing Goal: Ability to manage health-related needs will improve Outcome: Progressing   Problem: Metabolic: Goal: Ability to maintain appropriate glucose levels will improve Outcome: Progressing   Problem: Nutritional: Goal: Maintenance of adequate nutrition will improve Outcome: Progressing Goal: Progress toward achieving an optimal weight will improve Outcome: Progressing   Problem: Skin Integrity: Goal: Risk for impaired skin integrity will decrease Outcome: Progressing   Problem:  Tissue Perfusion: Goal: Adequacy of tissue perfusion will improve Outcome: Progressing   Problem: Education: Goal: Knowledge of General Education information will improve Description: Including pain rating scale, medication(s)/side effects and non-pharmacologic comfort measures Outcome: Progressing   Problem: Health Behavior/Discharge Planning: Goal: Ability to manage health-related needs will improve Outcome: Progressing   Problem: Clinical Measurements: Goal: Ability to maintain clinical measurements within normal limits will improve Outcome: Progressing Goal: Will remain free from infection Outcome: Progressing Goal: Diagnostic test results will improve Outcome: Progressing   Goal: Respiratory complications will improve Outcome: Progressing Goal: Cardiovascular complication will be avoided Outcome: Progressing   Problem: Activity: Goal: Risk for activity intolerance will decrease Outcome: Progressing   Problem: Nutrition: Goal: Adequate nutrition will be maintained Outcome: Progressing   Problem: Coping: Goal: Level of anxiety will decrease Outcome: Progressing   Problem: Elimination: Goal: Will not experience complications related to bowel motility Outcome: Progressing Goal: Will not experience complications related to urinary retention Outcome: Progressing   Problem: Pain Managment: Goal: General experience of comfort will improve Outcome: Progressing   Problem: Safety: Goal: Ability to remain free from injury will improve Outcome: Progressing   Problem: Skin Integrity: Goal: Risk for impaired skin integrity will decrease Outcome: Progressing   

## 2023-04-07 NOTE — Assessment & Plan Note (Signed)
On IV fluids x 12 hours.  Encourage oral hydration. CK: 846 on 9/28.  - Patient PO intake is improved

## 2023-04-07 NOTE — Assessment & Plan Note (Signed)
Pt has elevated TSH to 10 on admission. Pt has history of hypothyroidism, previously on 100 mcg levo. Increased to 125 mcg levothyroxine 10/1. - Denies new palpitations.

## 2023-04-08 ENCOUNTER — Inpatient Hospital Stay (HOSPITAL_COMMUNITY)
Admission: EM | Disposition: A | Discharge status: Skilled Nursing Facility | Payer: Self-pay | Source: Home / Self Care | Attending: Family Medicine

## 2023-04-08 ENCOUNTER — Inpatient Hospital Stay (HOSPITAL_COMMUNITY): Payer: Medicare HMO | Admitting: Anesthesiology

## 2023-04-08 ENCOUNTER — Encounter (HOSPITAL_COMMUNITY): Payer: Self-pay | Admitting: Family Medicine

## 2023-04-08 ENCOUNTER — Other Ambulatory Visit: Payer: Self-pay

## 2023-04-08 ENCOUNTER — Inpatient Hospital Stay (HOSPITAL_COMMUNITY): Payer: Medicare HMO

## 2023-04-08 ENCOUNTER — Inpatient Hospital Stay (HOSPITAL_COMMUNITY): Payer: Self-pay | Admitting: Anesthesiology

## 2023-04-08 DIAGNOSIS — R296 Repeated falls: Secondary | ICD-10-CM

## 2023-04-08 DIAGNOSIS — S12491A Other nondisplaced fracture of fifth cervical vertebra, initial encounter for closed fracture: Secondary | ICD-10-CM | POA: Diagnosis not present

## 2023-04-08 DIAGNOSIS — E119 Type 2 diabetes mellitus without complications: Secondary | ICD-10-CM | POA: Diagnosis not present

## 2023-04-08 DIAGNOSIS — S12400A Unspecified displaced fracture of fifth cervical vertebra, initial encounter for closed fracture: Secondary | ICD-10-CM

## 2023-04-08 DIAGNOSIS — I4891 Unspecified atrial fibrillation: Secondary | ICD-10-CM

## 2023-04-08 HISTORY — PX: ANTERIOR CERVICAL DECOMP/DISCECTOMY FUSION: SHX1161

## 2023-04-08 LAB — GLUCOSE, CAPILLARY
Glucose-Capillary: 93 mg/dL (ref 70–99)
Glucose-Capillary: 99 mg/dL (ref 70–99)
Glucose-Capillary: 79 mg/dL (ref 70–99)
Glucose-Capillary: 97 mg/dL (ref 70–99)

## 2023-04-08 LAB — SURGICAL PCR SCREEN
MRSA, PCR: NEGATIVE
Staphylococcus aureus: NEGATIVE

## 2023-04-08 SURGERY — ANTERIOR CERVICAL DECOMPRESSION/DISCECTOMY FUSION 1 LEVEL
Anesthesia: General | Site: Spine Cervical

## 2023-04-08 MED ORDER — SUCCINYLCHOLINE CHLORIDE 200 MG/10ML IV SOSY
PREFILLED_SYRINGE | INTRAVENOUS | Status: DC | PRN
  Administered 2023-04-08: 100 mg via INTRAVENOUS

## 2023-04-08 MED ORDER — OXYCODONE HCL 5 MG PO TABS: 5.0000 mg | ORAL_TABLET | Freq: Once | ORAL | Status: DC | PRN

## 2023-04-08 MED ORDER — ACETAMINOPHEN 325 MG PO TABS
650.0000 mg | ORAL_TABLET | ORAL | Status: DC | PRN
  Administered 2023-04-15 – 2023-04-18 (×5): 650 mg via ORAL
  Filled 2023-04-08 (×5): qty 2

## 2023-04-08 MED ORDER — PHENYLEPHRINE HCL (PRESSORS) 10 MG/ML IV SOLN
INTRAVENOUS | Status: AC
  Filled 2023-04-08: qty 1

## 2023-04-08 MED ORDER — MIDAZOLAM HCL 2 MG/2ML IJ SOLN
INTRAMUSCULAR | Status: AC
  Filled 2023-04-08: qty 2

## 2023-04-08 MED ORDER — CHLORHEXIDINE GLUCONATE CLOTH 2 % EX PADS
6.0000 | MEDICATED_PAD | Freq: Every day | CUTANEOUS | Status: DC
  Administered 2023-04-08 – 2023-04-18 (×7): 6 via TOPICAL

## 2023-04-08 MED ORDER — ACETAMINOPHEN 650 MG RE SUPP: 650.0000 mg | RECTAL | Status: DC | PRN

## 2023-04-08 MED ORDER — LIDOCAINE 5 % EX PTCH
1.0000 | MEDICATED_PATCH | CUTANEOUS | Status: DC
  Administered 2023-04-08 – 2023-04-19 (×12): 1 via TRANSDERMAL
  Filled 2023-04-08 (×12): qty 1

## 2023-04-08 MED ORDER — HYDROMORPHONE HCL 1 MG/ML IJ SOLN
INTRAMUSCULAR | Status: DC | PRN
  Administered 2023-04-08: .5 mg via INTRAVENOUS

## 2023-04-08 MED ORDER — MIDAZOLAM HCL 2 MG/2ML IJ SOLN
INTRAMUSCULAR | Status: DC | PRN
  Administered 2023-04-08: 2 mg via INTRAVENOUS

## 2023-04-08 MED ORDER — PHENYLEPHRINE 80 MCG/ML (10ML) SYRINGE FOR IV PUSH (FOR BLOOD PRESSURE SUPPORT)
PREFILLED_SYRINGE | INTRAVENOUS | Status: DC | PRN
  Administered 2023-04-08: 160 ug via INTRAVENOUS

## 2023-04-08 MED ORDER — PHENYLEPHRINE HCL-NACL 20-0.9 MG/250ML-% IV SOLN
INTRAVENOUS | Status: DC | PRN
  Administered 2023-04-08: 30 ug/min via INTRAVENOUS

## 2023-04-08 MED ORDER — MENTHOL 3 MG MT LOZG: 1.0000 | LOZENGE | OROMUCOSAL | Status: DC | PRN

## 2023-04-08 MED ORDER — CHLORHEXIDINE GLUCONATE 0.12 % MT SOLN
15.0000 mL | OROMUCOSAL | Status: AC
  Administered 2023-04-08: 15 mL via OROMUCOSAL

## 2023-04-08 MED ORDER — LIDOCAINE-EPINEPHRINE 1 %-1:100000 IJ SOLN
INTRAMUSCULAR | Status: AC
  Filled 2023-04-08: qty 1

## 2023-04-08 MED ORDER — ALBUMIN HUMAN 5 % IV SOLN
INTRAVENOUS | Status: DC | PRN
Start: 2023-04-08 — End: 2023-04-08

## 2023-04-08 MED ORDER — FENTANYL CITRATE (PF) 250 MCG/5ML IJ SOLN
INTRAMUSCULAR | Status: AC
  Filled 2023-04-08: qty 5

## 2023-04-08 MED ORDER — ONDANSETRON HCL 4 MG/2ML IJ SOLN: 4.0000 mg | Freq: Four times a day (QID) | INTRAMUSCULAR | Status: DC | PRN

## 2023-04-08 MED ORDER — MORPHINE SULFATE (PF) 2 MG/ML IV SOLN
2.0000 mg | INTRAVENOUS | Status: DC | PRN
  Administered 2023-04-09: 2 mg via INTRAVENOUS
  Filled 2023-04-08: qty 1

## 2023-04-08 MED ORDER — LIDOCAINE-EPINEPHRINE 1 %-1:100000 IJ SOLN
INTRAMUSCULAR | Status: DC | PRN
  Administered 2023-04-08: 5 mL

## 2023-04-08 MED ORDER — CHLORHEXIDINE GLUCONATE 0.12 % MT SOLN
OROMUCOSAL | Status: AC
  Filled 2023-04-08: qty 15

## 2023-04-08 MED ORDER — PANTOPRAZOLE SODIUM 40 MG IV SOLR
40.0000 mg | Freq: Every day | INTRAVENOUS | Status: DC
  Administered 2023-04-08: 40 mg via INTRAVENOUS
  Filled 2023-04-08: qty 10

## 2023-04-08 MED ORDER — DEXAMETHASONE SODIUM PHOSPHATE 10 MG/ML IJ SOLN
INTRAMUSCULAR | Status: DC | PRN
  Administered 2023-04-08: 6 mg via INTRAVENOUS
  Administered 2023-04-08: 4 mg via INTRAVENOUS

## 2023-04-08 MED ORDER — SODIUM CHLORIDE 0.9% FLUSH: 3.0000 mL | INTRAVENOUS | Status: DC | PRN

## 2023-04-08 MED ORDER — OXYCODONE HCL 5 MG PO TABS: 10.0000 mg | ORAL_TABLET | ORAL | Status: DC | PRN

## 2023-04-08 MED ORDER — SODIUM CHLORIDE 0.9% FLUSH
3.0000 mL | Freq: Two times a day (BID) | INTRAVENOUS | Status: DC
  Administered 2023-04-09 – 2023-04-16 (×15): 3 mL via INTRAVENOUS

## 2023-04-08 MED ORDER — THROMBIN 5000 UNITS EX SOLR
CUTANEOUS | Status: AC
  Filled 2023-04-08: qty 10000

## 2023-04-08 MED ORDER — HYDROMORPHONE HCL 1 MG/ML IJ SOLN
INTRAMUSCULAR | Status: AC
  Filled 2023-04-08: qty 0.5

## 2023-04-08 MED ORDER — ROCURONIUM BROMIDE 10 MG/ML (PF) SYRINGE
PREFILLED_SYRINGE | INTRAVENOUS | Status: DC | PRN
  Administered 2023-04-08 (×4): 50 mg via INTRAVENOUS

## 2023-04-08 MED ORDER — CEFAZOLIN SODIUM-DEXTROSE 2-4 GM/100ML-% IV SOLN
2.0000 g | Freq: Three times a day (TID) | INTRAVENOUS | Status: AC
  Administered 2023-04-08 – 2023-04-09 (×2): 2 g via INTRAVENOUS
  Filled 2023-04-08 (×2): qty 100

## 2023-04-08 MED ORDER — THROMBIN 5000 UNITS EX SOLR: OROMUCOSAL | Status: DC | PRN

## 2023-04-08 MED ORDER — MIDAZOLAM HCL 2 MG/2ML IJ SOLN: 0.5000 mg | Freq: Once | INTRAMUSCULAR | Status: DC | PRN

## 2023-04-08 MED ORDER — 0.9 % SODIUM CHLORIDE (POUR BTL) OPTIME
TOPICAL | Status: DC | PRN
  Administered 2023-04-08: 1000 mL

## 2023-04-08 MED ORDER — SODIUM CHLORIDE 0.9 % IV SOLN: 250.0000 mL | INTRAVENOUS | Status: DC

## 2023-04-08 MED ORDER — PHENOL 1.4 % MT LIQD: 1.0000 | OROMUCOSAL | Status: DC | PRN

## 2023-04-08 MED ORDER — THROMBIN 5000 UNITS EX SOLR
CUTANEOUS | Status: AC
  Filled 2023-04-08: qty 15000

## 2023-04-08 MED ORDER — METHOCARBAMOL 500 MG PO TABS: 500.0000 mg | ORAL_TABLET | Freq: Four times a day (QID) | ORAL | Status: DC | PRN

## 2023-04-08 MED ORDER — BUPIVACAINE HCL (PF) 0.5 % IJ SOLN
INTRAMUSCULAR | Status: AC
  Filled 2023-04-08: qty 30

## 2023-04-08 MED ORDER — BUPIVACAINE HCL 0.5 % IJ SOLN
INTRAMUSCULAR | Status: DC | PRN
  Administered 2023-04-08: 5 mL

## 2023-04-08 MED ORDER — PROPOFOL 10 MG/ML IV BOLUS
INTRAVENOUS | Status: DC | PRN
  Administered 2023-04-08: 150 mg via INTRAVENOUS

## 2023-04-08 MED ORDER — ONDANSETRON HCL 4 MG/2ML IJ SOLN
INTRAMUSCULAR | Status: DC | PRN
  Administered 2023-04-08: 4 mg via INTRAVENOUS

## 2023-04-08 MED ORDER — FENTANYL CITRATE (PF) 250 MCG/5ML IJ SOLN
INTRAMUSCULAR | Status: DC | PRN
  Administered 2023-04-08 (×2): 50 ug via INTRAVENOUS
  Administered 2023-04-08: 100 ug via INTRAVENOUS

## 2023-04-08 MED ORDER — HYDROMORPHONE HCL 1 MG/ML IJ SOLN: 0.2500 mg | INTRAMUSCULAR | Status: DC | PRN

## 2023-04-08 MED ORDER — PROPOFOL 10 MG/ML IV BOLUS
INTRAVENOUS | Status: AC
  Filled 2023-04-08: qty 20

## 2023-04-08 MED ORDER — METHOCARBAMOL 1000 MG/10ML IJ SOLN: 500.0000 mg | Freq: Four times a day (QID) | INTRAVENOUS | Status: DC | PRN

## 2023-04-08 MED ORDER — LACTATED RINGERS IV SOLN: INTRAVENOUS | Status: DC

## 2023-04-08 MED ORDER — SUGAMMADEX SODIUM 200 MG/2ML IV SOLN
INTRAVENOUS | Status: DC | PRN
  Administered 2023-04-08: 400 mg via INTRAVENOUS
  Administered 2023-04-08: 200 mg via INTRAVENOUS

## 2023-04-08 MED ORDER — MEPERIDINE HCL 25 MG/ML IJ SOLN: 6.2500 mg | INTRAMUSCULAR | Status: DC | PRN

## 2023-04-08 MED ORDER — ONDANSETRON HCL 4 MG PO TABS: 4.0000 mg | ORAL_TABLET | Freq: Four times a day (QID) | ORAL | Status: DC | PRN

## 2023-04-08 MED ORDER — THROMBIN (RECOMBINANT) 5000 UNITS EX SOLR: CUTANEOUS | Status: DC | PRN

## 2023-04-08 MED ORDER — OXYCODONE HCL 5 MG/5ML PO SOLN: 5.0000 mg | Freq: Once | ORAL | Status: DC | PRN

## 2023-04-08 SURGICAL SUPPLY — 61 items
ADH SKN CLS APL DERMABOND .7 (GAUZE/BANDAGES/DRESSINGS) ×1
ADH SKN CLS LQ APL DERMABOND (GAUZE/BANDAGES/DRESSINGS) ×1
APL SKNCLS STERI-STRIP NONHPOA (GAUZE/BANDAGES/DRESSINGS)
BAG COUNTER SPONGE SURGICOUNT (BAG) ×1 IMPLANT
BAG SPNG CNTER NS LX DISP (BAG) ×1
BENZOIN TINCTURE PRP APPL 2/3 (GAUZE/BANDAGES/DRESSINGS) IMPLANT
BIT DRILL 13 (BIT) IMPLANT
BLADE CLIPPER SURG (BLADE) IMPLANT
BLADE SURG 11 STRL SS (BLADE) ×1 IMPLANT
BLADE ULTRA TIP 2M (BLADE) IMPLANT
BUR MATCHSTICK NEURO 3.0 LAGG (BURR) ×1 IMPLANT
CANISTER SUCT 3000ML PPV (MISCELLANEOUS) ×1 IMPLANT
DERMABOND ADVANCED .7 DNX12 (GAUZE/BANDAGES/DRESSINGS) ×1 IMPLANT
DERMABOND ADVANCED .7 DNX6 (GAUZE/BANDAGES/DRESSINGS) IMPLANT
DRAPE C-ARM 42X72 X-RAY (DRAPES) ×2 IMPLANT
DRAPE HALF SHEET 40X57 (DRAPES) IMPLANT
DRAPE LAPAROTOMY 100X72 PEDS (DRAPES) ×1 IMPLANT
DRAPE MICROSCOPE SLANT 54X150 (MISCELLANEOUS) ×1 IMPLANT
DRSG OPSITE 4X5.5 SM (GAUZE/BANDAGES/DRESSINGS) ×2 IMPLANT
DRSG OPSITE POSTOP 4X6 (GAUZE/BANDAGES/DRESSINGS) IMPLANT
DURAPREP 6ML APPLICATOR 50/CS (WOUND CARE) ×1 IMPLANT
ELECT COATED BLADE 2.86 ST (ELECTRODE) ×1 IMPLANT
ELECT REM PT RETURN 9FT ADLT (ELECTROSURGICAL) ×1
ELECTRODE REM PT RTRN 9FT ADLT (ELECTROSURGICAL) ×1 IMPLANT
GAUZE 4X4 16PLY ~~LOC~~+RFID DBL (SPONGE) IMPLANT
GLOVE BIOGEL PI IND STRL 7.0 (GLOVE) IMPLANT
GLOVE BIOGEL PI IND STRL 7.5 (GLOVE) ×1 IMPLANT
GLOVE BIOGEL PI IND STRL 8 (GLOVE) IMPLANT
GLOVE ECLIPSE 7.0 STRL STRAW (GLOVE) ×2 IMPLANT
GLOVE EXAM NITRILE XL STR (GLOVE) IMPLANT
GOWN STRL REUS W/ TWL LRG LVL3 (GOWN DISPOSABLE) ×2 IMPLANT
GOWN STRL REUS W/ TWL XL LVL3 (GOWN DISPOSABLE) IMPLANT
GOWN STRL REUS W/TWL 2XL LVL3 (GOWN DISPOSABLE) IMPLANT
GOWN STRL REUS W/TWL LRG LVL3 (GOWN DISPOSABLE) ×2
GOWN STRL REUS W/TWL XL LVL3 (GOWN DISPOSABLE)
HEMOSTAT POWDER KIT SURGIFOAM (HEMOSTASIS) ×1 IMPLANT
INTERLOCK LRDTC CRVCL VBR LG (Bone Implant) IMPLANT
KIT BASIN OR (CUSTOM PROCEDURE TRAY) ×1 IMPLANT
KIT TURNOVER KIT B (KITS) ×1 IMPLANT
LORDOTIC CERVICAL VBR LG 5MM (Bone Implant) ×1 IMPLANT
NDL HYPO 22X1.5 SAFETY MO (MISCELLANEOUS) ×1 IMPLANT
NDL SPNL 22GX3.5 QUINCKE BK (NEEDLE) ×1 IMPLANT
NEEDLE HYPO 22X1.5 SAFETY MO (MISCELLANEOUS) ×1 IMPLANT
NEEDLE SPNL 22GX3.5 QUINCKE BK (NEEDLE) ×1 IMPLANT
NS IRRIG 1000ML POUR BTL (IV SOLUTION) ×1 IMPLANT
PACK LAMINECTOMY NEURO (CUSTOM PROCEDURE TRAY) ×1 IMPLANT
PAD ARMBOARD 7.5X6 YLW CONV (MISCELLANEOUS) ×3 IMPLANT
PLATE ATLANTIS ELITE 52 (Plate) IMPLANT
SCREW CERV SD ATL 4X17 (Screw) IMPLANT
SCREW SPINAL 4X16MM SELF DRILL (Screw) IMPLANT
SPIKE FLUID TRANSFER (MISCELLANEOUS) ×2 IMPLANT
SPONGE INTESTINAL PEANUT (DISPOSABLE) ×1 IMPLANT
SPONGE SURGIFOAM ABS GEL SZ50 (HEMOSTASIS) ×1 IMPLANT
STAPLER VISISTAT 35W (STAPLE) ×1 IMPLANT
STRIP CLOSURE SKIN 1/2X4 (GAUZE/BANDAGES/DRESSINGS) IMPLANT
SUT VIC AB 3-0 SH 8-18 (SUTURE) ×1 IMPLANT
SUT VICRYL 3-0 RB1 18 ABS (SUTURE) ×2 IMPLANT
TAPE CLOTH 3X10 TAN LF (GAUZE/BANDAGES/DRESSINGS) ×1 IMPLANT
TOWEL GREEN STERILE (TOWEL DISPOSABLE) ×1 IMPLANT
TOWEL GREEN STERILE FF (TOWEL DISPOSABLE) ×1 IMPLANT
WATER STERILE IRR 1000ML POUR (IV SOLUTION) ×1 IMPLANT

## 2023-04-08 NOTE — Progress Notes (Signed)
Contacted [facility] regarding patient belongings.  Was told if any belongings on [facility], they will take them to [facility].

## 2023-04-08 NOTE — Plan of Care (Signed)
  Problem: Pain Managment: Goal: General experience of comfort will improve Outcome: Progressing   Problem: Safety: Goal: Ability to remain free from injury will improve Outcome: Progressing   

## 2023-04-08 NOTE — Progress Notes (Signed)
Patient sent to pre-op for pending surgery. Report given to pre-op RN. Writer notified patient's mother [name] (per patient request). VSS. RA. Respirations are even and unlabored. NAD. PIV in place. CHG completed at 0500. Informed consent and chart sent with patient.

## 2023-04-08 NOTE — Progress Notes (Signed)
Patient somnolent in PACU.  Does arouse to voice.  Weak bilateral grip.  Patient unable to dorsiflex or plantarflex but will move toes to command.  Patient claims to have full sensation in all extremities.  VSS.  Will continue to monitor.

## 2023-04-08 NOTE — Anesthesia Postprocedure Evaluation (Signed)
Anesthesia Post Note  Patient: [name]  Procedure(s) Performed: Anterior Cervical Discectomy Fusion Cervical Five-Cervical Six (Spine Cervical)     Patient location during evaluation: PACU Anesthesia Type: General Level of consciousness: sedated and patient cooperative Pain management: pain level controlled Vital Signs Assessment: post-procedure vital signs reviewed and stable Respiratory status: spontaneous breathing, nonlabored ventilation, respiratory function stable and patient connected to nasal cannula oxygen Cardiovascular status: blood pressure returned to baseline and stable Postop Assessment: no apparent nausea or vomiting Anesthetic complications: no   No notable events documented.  Last Vitals:  Vitals:   04/08/23 2045 04/08/23 2100  BP: (!) 163/110 (!) 161/102  Pulse: 91 95  Resp: 15 17  Temp: (!) 36.1 C   SpO2: 99% 98%    Last Pain:  Vitals:   04/08/23 1444  TempSrc: Oral  PainSc:                  Ivie Maese,E. Joann Jorge

## 2023-04-08 NOTE — Anesthesia Procedure Notes (Signed)
Procedure Name: Intubation Date/Time: 04/08/2023 5:17 PM  Performed by: Sandie Ano, CRNAPre-anesthesia Checklist: Patient identified, Emergency Drugs available, Suction available and Patient being monitored Patient Re-evaluated:Patient Re-evaluated prior to induction Oxygen Delivery Method: Circle System Utilized Preoxygenation: Pre-oxygenation with 100% oxygen Induction Type: IV induction Ventilation: Mask ventilation without difficulty Laryngoscope Size: Glidescope and 3 Grade View: Grade I Tube type: Oral Tube size: 7.0 mm Number of attempts: 1 Airway Equipment and Method: Stylet and Oral airway Placement Confirmation: ETT inserted through vocal cords under direct vision, positive ETCO2 and breath sounds checked- equal and bilateral Secured at: 23 cm Tube secured with: Tape Dental Injury: Teeth and Oropharynx as per pre-operative assessment

## 2023-04-08 NOTE — Transfer of Care (Signed)
Immediate Anesthesia Transfer of Care Note  Patient: [name]  Procedure(s) Performed: Anterior Cervical Discectomy Fusion Cervical Five-Cervical Six (Spine Cervical)  Patient Location: PACU  Anesthesia Type:General  Level of Consciousness: drowsy  Airway & Oxygen Therapy: Patient Spontanous Breathing and Patient connected to face mask oxygen  Post-op Assessment: Report given to RN and Post -op Vital signs reviewed and stable  Post vital signs: Reviewed and stable  Last Vitals:  Vitals Value Taken Time  BP 161/102 04/08/23 2100  Temp    Pulse 89 04/08/23 2101  Resp 17 04/08/23 2101  SpO2 98 % 04/08/23 2101  Vitals shown include unfiled device data.  Last Pain:  Vitals:   04/08/23 1444  TempSrc: Oral  PainSc:       Patients Stated Pain Goal: 3 (04/05/23 1030)  Complications: No notable events documented.

## 2023-04-08 NOTE — Progress Notes (Signed)
eLink Physician-Brief Progress Note Patient Name: [name] DOB: Nov 18, 1960 MRN: [id number]   Date of Service  04/08/2023  HPI/Events of Note  62/M, admitted 9/28 for frequent falls, AMS and was subsequently found to have a C5-C6 fracture. He was taken to the OR this evening, underwent C5-C6 discectomy , placement of intervertebral biomechanical device. Pt tolerated procedure well, no complications reported.  BP 149/100; HR 87, irregular. RR 16, SpO2 96%   eICU Interventions  -Pt admitted to ICU for close monitoring post op.  -Pt hemodynamically stable, protecting airway  - Serial neuro assessments per protocol - Pain control - Encourage incentive spirometry, if possible. - Will follow further neurosurgery plans/recommendations.          Lolly Glaus M DELA CRUZ 04/08/2023, 10:31 PM

## 2023-04-08 NOTE — Assessment & Plan Note (Deleted)
Patient reports no BM in 5 days. Has chronic constipation diagnosis in the past. On miralax/senna-docusate.  - Consider escalating bowel regimen after surgery.

## 2023-04-08 NOTE — Assessment & Plan Note (Addendum)
Multiple falls in the setting of DISH.  Patient lives home alone and may not be able to sustain this.  On conversation with mother, family has already begun discussing options for assisted living, as they do not believe that he is safe at home anymore. Patient knows that he has fallen multiple times.  - PT/OT will defer until C-spine s/p stabilization 10/3. Assess afterwards.  - Fall precautions. - TOC will follow.

## 2023-04-08 NOTE — Assessment & Plan Note (Addendum)
Developed off-and-on chest pain yesterday.  Troponin was negative.  EKG at baseline.  Likely GERD. - Ordered PRN Tums.

## 2023-04-08 NOTE — Assessment & Plan Note (Addendum)
A little bit of neck pain.  Understands he is going for surgery today.  No new complaints.  - NSGY scheduling his surgery for Thursday afternoon 10/3.  Neurosurgery will reach out to discuss postop planning. - Continue Tylenol 650 mg every 6 hours.  Added lidocaine patch for right shoulder pain. - Discontinued heparin. - NPO at midnight 10/3

## 2023-04-08 NOTE — Assessment & Plan Note (Deleted)
Pt has elevated TSH to 10 on admission. Pt has history of hypothyroidism, previously on 100 mcg levo. Increased to 125 mcg levothyroxine 10/1. - Denies new palpitations.

## 2023-04-08 NOTE — Progress Notes (Addendum)
Daily Progress Note Intern Pager: [phone]  Patient name: [name] Medical record number: [id number] Date of birth: [birth year] Age: 62 y.o. Gender: male  Primary Care Provider: Tiffany Kocher, DO Consultants: Psychiatry, neurosurgery Code Status: Full  Pt Overview and Major Events to Date:   [name] is a 62 year old male with a history of atrial fibrillation, seizure disorder, schizoaffective disorder, and MDD who was admitted to the family medicine teaching service at [facility] for altered mental status.  C5 fracture found on imaging.  Scheduled for C-spine stabilization today.  9/28: Admitted for AMS.  C5 fracture found on imaging.  C-spine collar placed. 9/29: Psych consulted, decreased his home psych medications to reduce anticholinergic side effects. 9/30: Patient mentation improved.  Begun on heparin 5000 units every 8 hours. 10/1: Reversible causes of dementia (B12, RPR).  B12 negative. 10/2: No acute events overnight. Noted 5/10 neck pain. Noted chest pain. Troponin/EKG ordered.  10/3: C-spine stabilization plan. Assessment & Plan C5 cervical fracture (HCC) A little bit of neck pain.  Understands he is going for surgery today.  No new complaints.  - NSGY scheduling his surgery for Thursday afternoon 10/3.  Neurosurgery will reach out to discuss postop planning. - Continue Tylenol 650 mg every 6 hours.  Added lidocaine patch for right shoulder pain. - Discontinued heparin. - NPO at midnight 10/3 Disorientation Appears more oriented today: Name, date of birth, hospital, [place], year.  Per psych: suspicion of some underlying dementia.  No clear pattern of AMS.  Continued poor attention, has to be reoriented to conversation. - Neuro checks q4h. - Psychiatry to follow up on Friday post-surgery. - Continue patient's psychiatric medications as below Multiple falls Multiple falls in the setting of DISH.  Patient lives home alone and may not be able to sustain  this.  On conversation with mother, family has already begun discussing options for assisted living, as they do not believe that he is safe at home anymore. Patient knows that he has fallen multiple times.  - PT/OT will defer until C-spine s/p stabilization 10/3. Assess afterwards.  - Fall precautions. - TOC will follow.  T2DM (type 2 diabetes mellitus) (HCC) A1c 6.4. CBG checks + mSSI.  Glucoses in the low 100s over the last 24 hours.  Required 6 units of sliding scale insulin aspart. - Discontinue CBG and sliding scale in setting of well-controlled glucoses and upcoming surgery. Chest pain (Resolved: 04/08/2023) Developed off-and-on chest pain yesterday.  Troponin was negative.  EKG at baseline.  Likely GERD. - Ordered PRN Tums. Elevated CK (Resolved: 04/08/2023) On IV fluids x 12 hours.  Encourage oral hydration. CK: 846 on 9/28.  - Patient PO intake is improved   Chronic and Stable Issues: MDD: Phenelzine 30 mg BID Schizoaffective Disorder: Perphenazine 8 mg Q12 ? 16 mg nightly, depakote 500 mg bid ? 1000 nightly per psych recs HLD: Continue atorvastatin 40 mg daily. Tremor: Change Cogentin 0.5 mg daily ? 0.5 mg nightly  FEN/GI: NPO PPx: Heparin, now discontinued for surgery Dispo: Home versus ALF, per family.  Barriers include: Neurosurgery, PT/OT assessment.  Subjective:  No acute events overnight.  Objective: DPs: 140s over 90s.  BP max: 160/109.  HR: 81 RR: 16. Afebrile.  Satting 95% on room air. Physical Exam: General: No acute distress, lying in bed, c-collar placed Cardiovascular: No murmurs rubs or gallops, irregular rate Respiratory: Clear to auscultation in bilateral anterior lung fields Abdomen: Bowel sounds present, no tenderness in 4 quadrants Extremities: No lower extremity edema  noted  Laboratory: Most recent CBC Lab Results  Component Value Date   WBC 8.8 04/07/2023   HGB 13.4 04/07/2023   HCT 38.9 (L) 04/07/2023   MCV 88.8 04/07/2023   PLT 142 (L)  04/07/2023   Most recent BMP    Latest Ref Rng & Units 04/05/2023    7:50 AM  BMP  Glucose 70 - 99 mg/dL 161   BUN 8 - 23 mg/dL 8   Creatinine 0.96 - 0.45 mg/dL 4.09   Sodium 811 - 914 mmol/L 136   Potassium 3.5 - 5.1 mmol/L 3.6   Chloride 98 - 111 mmol/L 98   CO2 22 - 32 mmol/L 26   Calcium 8.9 - 10.3 mg/dL 8.6    Other pertinent labs   BGs: 93-157 over the last 24 hours. PCR: Negative MRSA, negative Staph aureus. Troponins negative  Imaging/Diagnostic Tests:  No new imaging.  Tomie China, MD 04/08/2023, 8:16 AM  PGY-1, [facility] FPTS Intern pager: [phone], text pages welcome Secure chat group [facility] Teaching Service

## 2023-04-08 NOTE — Plan of Care (Signed)

## 2023-04-08 NOTE — Progress Notes (Signed)
Patient's bladder scan noted 692 ml. Writer informed Tomie China, MD. No new MD orders placed at this time.

## 2023-04-08 NOTE — Assessment & Plan Note (Addendum)
Appears more oriented today: Name, date of birth, hospital, [place], year.  Per psych: suspicion of some underlying dementia.  No clear pattern of AMS.  Continued poor attention, has to be reoriented to conversation. - Neuro checks q4h. - Psychiatry to follow up on Friday post-surgery. - Continue patient's psychiatric medications as below

## 2023-04-08 NOTE — TOC Progression Note (Signed)
Transition of Care [facility]) - Progression Note    Patient Details  Name: [name] MRN: [id number] Date of Birth: Jun 14, 1961  Transition of [name]) CM/SW Contact  Kermit Balo, RN Phone Number: 04/08/2023, 1:38 PM  Clinical Narrative:     OR today and then await therapy evals. TOC following.  Expected Discharge Plan:  (to be determined) Barriers to Discharge: Continued Medical Work up  Expected Discharge Plan and Services   Discharge Planning Services: CM Consult   Living arrangements for the past 2 months: Apartment                                       Social Determinants of Health (SDOH) Interventions SDOH Screenings   Food Insecurity: No Food Insecurity (04/04/2023)  Housing: Low Risk  (04/04/2023)  Transportation Needs: No Transportation Needs (04/04/2023)  Utilities: Not At Risk (04/04/2023)  Alcohol Screen: Low Risk  (02/13/2023)  Depression (PHQ2-9): Low Risk  (02/19/2023)  Recent Concern: Depression (PHQ2-9) - Medium Risk (12/21/2022)  Financial Resource Strain: Low Risk  (02/13/2023)  Physical Activity: Inactive (02/13/2023)  Social Connections: Socially Isolated (02/13/2023)  Stress: No Stress Concern Present (02/13/2023)  Tobacco Use: Low Risk  (04/03/2023)  Health Literacy: Adequate Health Literacy (02/13/2023)    Readmission Risk Interventions     No data to display

## 2023-04-08 NOTE — Assessment & Plan Note (Addendum)
A1c 6.4. CBG checks + mSSI.  Glucoses in the low 100s over the last 24 hours.  Required 6 units of sliding scale insulin aspart. - Discontinue CBG and sliding scale in setting of well-controlled glucoses and upcoming surgery.

## 2023-04-08 NOTE — Op Note (Signed)
NEUROSURGERY OPERATIVE NOTE   PREOP DIAGNOSIS:  Unstable C5-6 fracture  POSTOP DIAGNOSIS: Same  PROCEDURE: 1. Discectomy at C5-6   2. Placement of intervertebral biomechanical device Medtronic Titan large 5mm cage 3. Placement of anterior instrumentation consisting of interbody plate and screws - Medtronic Atlantis 3-level plate, 17mm screws  4. Use of morselized bone allograft  5. Arthrodesis C5-6, anterior interbody technique  6. Use of intraoperative microscope  SURGEON: Dr. Lisbeth Renshaw, MD  ASSISTANT: Dr. Monia Pouch, MD  ANESTHESIA: General Endotracheal  EBL: 50cc  SPECIMENS: None  DRAINS: Medium Hemovac  COMPLICATIONS: None immediate  CONDITION: Hemodynamically stable to PACU  HISTORY: [name] is a 62 y.o. y.o. male who initially presented to the emergency department after suffering a fall.  His CT scan revealed a fracture through the C5-6 disc space with MRI confirming fracture and ligamentous disruption indicating unstable injury.  The patient was on Eliquis and his anticoagulation was therefore stopped.  After several days for his anticoagulation to dissipate, he presents for operative fixation.  The risks, benefits, and alternatives were all reviewed in detail with the patient.  After all his questions were answered informed consent was obtained and witnessed.  PROCEDURE IN DETAIL: The patient was brought to the operating room and transferred to the operative table. After induction of general anesthesia, the patient was positioned on the operative table in the supine position with all pressure points meticulously padded. The skin of the neck was then prepped and draped in the usual sterile fashion.  After timeout was conducted, the right-sided transverse skin incision was infiltrated with local anesthetic. Skin incision was then made sharply and Bovie electrocautery was used to dissect the subcutaneous tissue until the platysma was identified. The  platysma was then divided and undermined. The sternocleidomastoid muscle was then identified and the omohyoid was identified and skeletonized.  I then dissected just medial to the internal carotid artery and was able to palpate the anterior cervical spine.  As I dissected in an apparent fascial plane, I encountered relatively thick striated tissue, and I was concerned that I had entered inadvertently into the esophagus.  After further inspection, I was not clearly able to identify the esophageal lumen.  Nonetheless, the striated tissue was highly concerning for muscle.  I dissected further laterally along the carotid sheath and ultimately identify the anterior cervical spine.  I then used the Bovie to dissect in the subperiosteal plane to further identify the anterior cervical spine.  A dissector was placed in the clear fracture at C5-6 and confirmed with lateral fluoroscopy.  Prior to proceeding with the discectomy, I further inspected the possible esophageal injury.  Again, I was not able to identify the true lumen of the esophagus.  The anesthesia service was asked to place an orogastric tube.  Placement of the tube was confirmed with AP and lateral fluoroscopy.  I was not able to visualize the orogastric tube within the surgical field suggesting that we had in fact not enter the esophagus.  I placed a dissector underneath what did appear to be the external surface of the esophagus and another lateral fluoroscopic image was taken clearly underneath the implanted orogastric tube.  Again provided some reassurance that in fact we had not entered the esophagus.  At this point self-retaining retractors were placed.  Curettes were used to remove any remaining disc material at the fracture site.  Trial was then used to select a large 5 mm lordotic implant.  This was packed with morselized  DBM and tapped into place.  At this point a 3 level plate was selected and placed across the anterior cervical vertebral bodies.   Sizing was confirmed with lateral fluoroscopy.  17 mm screws were then placed in the C4, C5, C6, and C7 bodies using fixed screws and a drill guide.  A lateral fluoroscopic image was taken which appeared to demonstrate good cervical alignment.  At this point, after all counts were verified to be correct, meticulous hemostasis was secured using a combination of bipolar electrocautery and passive hemostatics.  I placed a medium Hemovac drain and tunneled subcutaneously.  The platysma muscle was then closed using interrupted 3-0 Vicryl sutures, and the skin was closed with an interrupted 3-0 Vicry subcuticular stitch. Dermabond and sterile dressings were then applied and the drapes removed.  The anesthesia service then utilized the GlideScope to confirm that the orogastric tube was in fact in the esophagus.  The patient tolerated the procedure and was extubated in the room and taken to the postanesthesia care unit in stable condition.  At the end of the case all sponge, needle, instrument, and cottonoid counts were correct.   Lisbeth Renshaw, MD [facility]

## 2023-04-08 NOTE — Progress Notes (Signed)
FMTS Brief Progress Note  S: Post op check with Dr. Dolan Amen. Patient was snoring prior to entering room. RN stated bladder scan showed >652mL. Asked whether they should in/out. He currently has condom catheter on. Patient continued to fall asleep when asking to urinate. He denies any pain.   O: BP (!) 149/106   Pulse 99   Temp 97.6 F (36.4 C) (Oral)   Resp 10   Ht 5' 2.5" (1.588 m)   Wt 96.9 kg   SpO2 94%   BMI 38.45 kg/m   Gen: fatigued, somnolent, arousable to voice, appropriate in conversation when awake Neck: C-collar in place CV: RRR, no murmurs auscultated Abdomen: no suprapubic tenderness, condom catheter in place Extremities: SCDs in place  A/P: S/p discectomy C5-6 Somnolent but arousable to voice. Retained urine from prior bladder scans. Will allow for a few more hours of rest prior to in/out catheterizing. Bladder scan in 3-4 hours. Pain is well controlled. SCDs in place for dvt ppx. He has diet orders as well. - Orders reviewed. Labs for AM not ordered, which was adjusted as needed.   Shelby Mattocks, DO 04/08/2023, 10:33 PM PGY-3, [facility] Night Resident  Please page [phone] with questions.

## 2023-04-08 NOTE — Progress Notes (Signed)
PT Cancellation Note  Patient Details Name: [name] MRN: [id number] DOB: 14-Nov-1960   Cancelled Treatment:    Reason Eval/Treat Not Completed: Patient not medically ready  Plan for operative procedure to stabilize c-spine this afternoon. Please update PT and OT orders when appropriate for comprehensive evaluation. Thank you!  Kathlyn Sacramento, PT, DPT [facility]  Rehabilitation Services Physical Therapist Office: [phone] Website: [email or web address]  Berton Mount 04/08/2023, 8:27 AM

## 2023-04-08 NOTE — Anesthesia Preprocedure Evaluation (Addendum)
Anesthesia Evaluation  Patient identified by MRN, date of birth, ID band Patient awake    Reviewed: Allergy & Precautions, NPO status , Patient's Chart, lab work & pertinent test results  Airway Mallampati: II  TM Distance: >3 FB Neck ROM: Limited    Dental  (+) Teeth Intact, Dental Advisory Given   Pulmonary neg pulmonary ROS   Pulmonary exam normal breath sounds clear to auscultation       Cardiovascular + dysrhythmias Atrial Fibrillation  Rhythm:Irregular Rate:Abnormal     Neuro/Psych Seizures -,  PSYCHIATRIC DISORDERS  Depression  Schizophrenia  C5-6 Fracture  Neuromuscular disease    GI/Hepatic negative GI ROS, Neg liver ROS,,,  Endo/Other  diabetes, Type 2Hypothyroidism  Obesity   Renal/GU negative Renal ROS     Musculoskeletal  (+) Arthritis , Osteoarthritis,    Abdominal   Peds  Hematology  (+) Blood dyscrasia (Eliquis)   Anesthesia Other Findings Day of surgery medications reviewed with the patient.  Reproductive/Obstetrics                             Anesthesia Physical Anesthesia Plan  ASA: 3  Anesthesia Plan: General   Post-op Pain Management: Tylenol PO (pre-op)*   Induction: Intravenous  PONV Risk Score and Plan: 2 and Midazolam, Dexamethasone and Ondansetron  Airway Management Planned: Oral ETT and Video Laryngoscope Planned  Additional Equipment:   Intra-op Plan:   Post-operative Plan: Extubation in OR  Informed Consent: I have reviewed the patients History and Physical, chart, labs and discussed the procedure including the risks, benefits and alternatives for the proposed anesthesia with the patient or authorized representative who has indicated his/her understanding and acceptance.     Dental advisory given  Plan Discussed with: CRNA  Anesthesia Plan Comments:        Anesthesia Quick Evaluation

## 2023-04-08 NOTE — Assessment & Plan Note (Signed)
On IV fluids x 12 hours.  Encourage oral hydration. CK: 846 on 9/28.  - Patient PO intake is improved

## 2023-04-08 NOTE — Progress Notes (Signed)
  NEUROSURGERY PROGRESS NOTE   No issues overnight. Pt without c/o, unchanged neck pain. No new N/T/W.  EXAM:  BP (!) 150/89 (BP Location: Right Arm)   Pulse 72   Temp (!) 97.5 F (36.4 C) (Oral)   Resp 18   Ht 5' 2.5" (1.588 m)   Wt 96.9 kg   SpO2 94%   BMI 38.45 kg/m   Awake, alert, oriented  Speech fluent, appropriate  CN grossly intact  5/5 BUE/BLE   IMPRESSION:  62 y.o. male with unstable C5-6 hyperextension fracture with DISH requiring operative stabilization.  PLAN: - Will proceed with ACDF C5-6  I have again reviewed the details of the procedure, risks, benefits, and alternatives to surgery with the patient. All his questions were answered and he provided consent to proceed.   Lisbeth Renshaw, MD [facility]

## 2023-04-09 DIAGNOSIS — R410 Disorientation, unspecified: Secondary | ICD-10-CM | POA: Diagnosis not present

## 2023-04-09 DIAGNOSIS — S12401A Unspecified nondisplaced fracture of fifth cervical vertebra, initial encounter for closed fracture: Secondary | ICD-10-CM | POA: Diagnosis not present

## 2023-04-09 LAB — GLUCOSE, CAPILLARY: Glucose-Capillary: 181 mg/dL — ABNORMAL HIGH (ref 70–99)

## 2023-04-09 MED ORDER — CEFAZOLIN SODIUM-DEXTROSE 2-3 GM-%(50ML) IV SOLR
INTRAVENOUS | Status: DC | PRN
Start: 2023-04-08 — End: 2023-04-09
  Administered 2023-04-08: 2 g via INTRAVENOUS

## 2023-04-09 MED FILL — Thrombin For Soln 5000 Unit: CUTANEOUS | Qty: 2 | Status: AC

## 2023-04-09 NOTE — Plan of Care (Signed)
  Problem: Education: Goal: Ability to describe self-care measures that may prevent or decrease complications (Diabetes Survival Skills Education) will improve Outcome: Progressing Goal: Individualized Educational Video(s) Outcome: Progressing   Problem: Coping: Goal: Ability to adjust to condition or change in health will improve Outcome: Progressing   Problem: Fluid Volume: Goal: Ability to maintain a balanced intake and output will improve Outcome: Progressing   Problem: Health Behavior/Discharge Planning: Goal: Ability to identify and utilize available resources and services will improve Outcome: Progressing Goal: Ability to manage health-related needs will improve Outcome: Progressing   Problem: Metabolic: Goal: Ability to maintain appropriate glucose levels will improve Outcome: Progressing   Problem: Nutritional: Goal: Maintenance of adequate nutrition will improve Outcome: Progressing Goal: Progress toward achieving an optimal weight will improve Outcome: Progressing   Problem: Skin Integrity: Goal: Risk for impaired skin integrity will decrease Outcome: Progressing   Problem: Tissue Perfusion: Goal: Adequacy of tissue perfusion will improve Outcome: Progressing   Problem: Education: Goal: Knowledge of General Education information will improve Description: Including pain rating scale, medication(s)/side effects and non-pharmacologic comfort measures Outcome: Progressing   Problem: Health Behavior/Discharge Planning: Goal: Ability to manage health-related needs will improve Outcome: Progressing   Problem: Clinical Measurements: Goal: Ability to maintain clinical measurements within normal limits will improve Outcome: Progressing Goal: Will remain free from infection Outcome: Progressing Goal: Diagnostic test results will improve Outcome: Progressing Goal: Respiratory complications will improve Outcome: Progressing Goal: Cardiovascular complication will  be avoided Outcome: Progressing   Problem: Activity: Goal: Risk for activity intolerance will decrease Outcome: Progressing   Problem: Nutrition: Goal: Adequate nutrition will be maintained Outcome: Progressing   Problem: Coping: Goal: Level of anxiety will decrease Outcome: Progressing   Problem: Elimination: Goal: Will not experience complications related to bowel motility Outcome: Progressing Goal: Will not experience complications related to urinary retention Outcome: Progressing   Problem: Pain Managment: Goal: General experience of comfort will improve Outcome: Progressing   Problem: Safety: Goal: Ability to remain free from injury will improve Outcome: Progressing   Problem: Skin Integrity: Goal: Risk for impaired skin integrity will decrease Outcome: Progressing   Problem: Education: Goal: Ability to verbalize activity precautions or restrictions will improve Outcome: Progressing Goal: Knowledge of the prescribed therapeutic regimen will improve Outcome: Progressing Goal: Understanding of discharge needs will improve Outcome: Progressing   Problem: Activity: Goal: Ability to avoid complications of mobility impairment will improve Outcome: Progressing Goal: Ability to tolerate increased activity will improve Outcome: Progressing Goal: Will remain free from falls Outcome: Progressing   Problem: Bowel/Gastric: Goal: Gastrointestinal status for postoperative course will improve Outcome: Progressing   Problem: Clinical Measurements: Goal: Ability to maintain clinical measurements within normal limits will improve Outcome: Progressing Goal: Postoperative complications will be avoided or minimized Outcome: Progressing Goal: Diagnostic test results will improve Outcome: Progressing   Problem: Pain Management: Goal: Pain level will decrease Outcome: Progressing   Problem: Skin Integrity: Goal: Will show signs of wound healing Outcome: Progressing    Problem: Health Behavior/Discharge Planning: Goal: Identification of resources available to assist in meeting health care needs will improve Outcome: Progressing   Problem: Bladder/Genitourinary: Goal: Urinary functional status for postoperative course will improve Outcome: Progressing   

## 2023-04-09 NOTE — Progress Notes (Signed)
  NEUROSURGERY PROGRESS NOTE   No issues overnight. Pt reports no significant dysphagia, tolerated clear liquids this am. Minimal neck pain. No new N/T/W.  EXAM:  BP (!) 153/95   Pulse 99   Temp (!) 96.1 F (35.6 C) (Axillary) Comment: Reported to the nurse, warm blankets applied  Resp 16   Ht 5' 2.5" (1.588 m)   Wt 96.9 kg   SpO2 97%   BMI 38.45 kg/m   Awake, alert, oriented  Speech fluent, appropriate  CN grossly intact  5/5 BUE/BLE  Dressing c/d/I  IMPRESSION:  62 y.o. male POD#1 C5-6 ACDF, intraoperatively there was some concern for esophageal injury however after inspection and overnight observation that seems highly unlikely.  PLAN: - Can transfer out of ICU to monitored bed - Would plan on keeping hemovac today, d/c tomorrow - Keep [place] J collar on when OOB - If stable, would be ok to restart Eliquis on 10/8   Lisbeth Renshaw, MD [facility]

## 2023-04-09 NOTE — Consult Note (Signed)
[name] Followup Face-to-Face Psychiatric Evaluation   Service Date: April 09, 2023 LOS:  LOS: 6 days    Assessment  [name] is a 62 y.o. male admitted medically for 04/03/2023  4:28 PM for recurrent false with resulting C5 fracture. He carries the psychiatric diagnoses of MDD, Anxiety and distant dx of schizoaffective d/o and OCD and has a past medical history of  tremor.Psychiatry was consulted for "worsening AMS w/o obvious delirium" by Anthony China, MD.    His current presentation of waxing and waning confusion is most consistent with delirium. He meets criteria for delirium based on presentation. Current outpatient psychotropic medications include phenelzine 45mg  bid, depakote 1000qhs, trilafon 24 at bedtime,a and cogentin 1 bid and historically he has had a good response to these medications. He was to his knowledge compliant with medications prior to admission as evidenced by patient reporting that he was taking them and putting them in pill boxes himself. On initial examination, patient discussed the concern for the patient's altered mental status, and also tremors, that are believed to be side effects to his current outpatient psychiatric medication. Initially the patient stated that he understands this, and that has these medications have provided him psychiatric stability for sometime. He had some recollection of disorientation prior to admission. He also was aware of the falls that preceded the admission.   Per chart patient has signs of chronic brain atrophy and there has been documented concerns regarding "memory, tremors, vertigo , and shakes." While some of patient's presentation could be 2/2 to noted atrophy and chronic small vessel dx changes on CT; however, patient's medication regimen could also be contributing. There was some noted improvement with decreasing his Cogentin, phenelzine, and perphenazine however, these medication adjustments can only  be made slowly as rapid discontinuation or titration could worsen presentation causing withdrawal side effects. Will attempt to consolidate medication to night( as they were in his home regimen) to help minimize sedating side effects, that patient is endorsing.   Please see plan below for detailed recommendations.   Diagnoses:  Active Hospital problems: Principal Problem:   C5 cervical fracture (HCC) Active Problems:   T2DM (type 2 diabetes mellitus) (HCC)   Hypothyroidism   Multiple falls   Disorientation     Plan  ## Safety and Observation Level:  - Based on my clinical evaluation, I estimate the patient to be at low risk of self harm in the current setting - At this time, we recommend a routine level of observation. This decision is based on my review of the chart including patient's history and current presentation, interview of the patient, mental status examination, and consideration of suicide risk including evaluating suicidal ideation, plan, intent, suicidal or self-harm behaviors, risk factors, and protective factors. This judgment is based on our ability to directly address suicide risk, implement suicide prevention strategies and develop a safety plan while the patient is in the clinical setting. Please contact our team if there is a concern that risk level has changed.   ## Medications:  -- Continue phenelzine 30mg  bid -- Continue Depakote 500mg  bid to 1000qhs -- Continue cogentin  0.5mg  at bedtime -- Continue trilafon 16mg  qhs  ## Medical Decision Making Capacity:  Not formally assessed  ## Further Work-up:  -- Per primary    -- most recent EKG on 04/07/2023 had QtC of 440 w/  A  fib. -- Pertinent labwork reviewed earlier this admission includes:     Latest Ref Rng & Units  04/05/2023    7:50 AM 04/04/2023    5:36 AM 04/03/2023    6:22 PM  BMP  Glucose 70 - 99 mg/dL 865  784  696   BUN 8 - 23 mg/dL 8  12  15    Creatinine 0.61 - 1.24 mg/dL 2.95  2.84  1.32    Sodium 135 - 145 mmol/L 136  137  139   Potassium 3.5 - 5.1 mmol/L 3.6  4.1  3.3   Chloride 98 - 111 mmol/L 98  102  99   CO2 22 - 32 mmol/L 26  20    Calcium 8.9 - 10.3 mg/dL 8.6  8.7      TSH: 44.010 B12: WNL  ## Disposition:  -- Deferred to primary team  ## Behavioral / Environmental:  -- DELIRIUM RECS 1: Avoid benzodiazepines, antihistamines, anticholinergics, and minimize opiate use as these may worsen delirium. 2:Assess, prevent and manage pain as lack of treatment can result in delirium.  3: Recommend consult to PT/OT if not already done. Early mobility and exercise has been shown to decrease duration of delirium.  4:Provide appropriate lighting and clear signage; a clock and calendar should be easily visible to the patient. 5:Monitor environmental factors. Reduce light and noise at night (close shades, turn off lights, turn off TV, ect). Correct any alterations in sleep cycle. 6: Reorient the patient to person, place, time and situation on each encounter.  7: Correct sensory deficits if possible (replace eye glasses, hearing aids, ect). 8: Avoid restraints. Severely delirious patients benefit from constant observation by a sitter. 9: Do not leave patient unattended.     ##Legal Status Voluntary  Thank you for this consult request. We will sign off at this time.   Anthony Amos, FNP    followup history  Relevant Aspects of Hospital Course:  Admitted on 04/03/2023 for fall with C5 fracture. [name] is a 62 yo male p/f encephalopathy of unknown origin in setting of recurrent falls. Patient has a h/o MDD, Anxiety and possible hx of schizoaffective d/o on multiple antipsychotics with high side effect profile.  Patient Report:    Today, the patient was evaluated in person while in bed at the hospital. He was alert and oriented to person, place, and time (oriented x 3). Over the past few days, there appears to be no fluctuating orientation, confusion, or  episodes of psychosis. He denies any acute psychosis of hallucinations, paranoia, or disorganized thoughts. Despite his communication being somewhat circumstantial and lengthy, he remains coherent in his thought processes and was able to stay on task with minimal redirection. He requested assistance with the remote control to turn to the hockey game.   In today's evaluation, there was notable improvement. The patient showed no signs of psychosis, delusions, or confusion. His ability to engage in conversation was effective, with less deviation from the topics and improved focus on tasks. Considering his current stable mental state, it is recommended that he continue with his prescribed medications to manage his symptoms. Additionally, outpatient support is suggested to further improve his physical and cognitive Trujillo and independence. Other than some ongoing concern regarding his circumstantial communication, the patient appears to be at his baseline. He denies any suicidal ideations, homicidal ideations, and hallucinations at this time.    Additional test on concentration: Patient was able to do DOWB.    Psychiatric History:  Information collected from EMR Reports a history of diagnosis of depression, schizoaffective disorder, OCD.  He reports a history of  multiple psychiatric hospitalizations, and the last hospitalization he states was in 1985.  He denies any history of suicide attempts.   Family Hx:  The patient's family history includes Diabetes in his brother and father; Heart disease in his father; OCD in his mother.  Medical History: Past Medical History:  Diagnosis Date   A-fib [facility])    Acute right-sided low back pain    Arrhythmia    Chronic a-fib (HCC)    Closed fracture of left distal radius    Contact dermatitis 04/16/2017   Depression    Depression    Hypothyroidism    Low back pain    Morbid obesity, BMI unknown (HCC)    Osteoarthritis    oa in bilateral knees    Schizophrenia (HCC)    Seizures (HCC)    Thyroid disease    Varicose veins     Surgical History: Past Surgical History:  Procedure Laterality Date   ACHILLES TENDON REPAIR  approx. 2004   right foot   CATARACT EXTRACTION     bilateral   OPEN REDUCTION INTERNAL FIXATION (ORIF) DISTAL RADIAL FRACTURE Left 07/21/2018   Procedure: OPEN REDUCTION INTERNAL FIXATION (ORIF) DISTAL RADIAL FRACTURE;  Surgeon: Betha Loa, MD;  Location: [facility];  Service: Orthopedics;  Laterality: Left;   stab phlebectomy  Right 11/09/2016   stab phlebectomy R leg by Josephina Gip MD   TOOTH EXTRACTION      Medications:   Current Facility-Administered Medications:    0.9 %  sodium chloride infusion, 250 mL, Intravenous, Continuous, Lisbeth Renshaw, MD   acetaminophen (TYLENOL) tablet 650 mg, 650 mg, Oral, Q4H PRN **OR** acetaminophen (TYLENOL) suppository 650 mg, 650 mg, Rectal, Q4H PRN, Lisbeth Renshaw, MD   acetaminophen (TYLENOL) tablet 650 mg, 650 mg, Oral, Q6H, Lisbeth Renshaw, MD, 650 mg at 04/09/23 1030   atorvastatin (LIPITOR) tablet 40 mg, 40 mg, Oral, Daily, Lisbeth Renshaw, MD, 40 mg at 04/09/23 1030   benztropine (COGENTIN) tablet 0.5 mg, 0.5 mg, Oral, QHS, Lisbeth Renshaw, MD   calcium carbonate (TUMS - dosed in mg elemental calcium) chewable tablet 200 mg of elemental calcium, 1 tablet, Oral, Daily PRN, Lisbeth Renshaw, MD   Chlorhexidine Gluconate Cloth 2 % PADS 6 each, 6 each, Topical, Daily, Dawley, Troy C, DO, 6 each at 04/09/23 1031   divalproex (DEPAKOTE ER) 24 hr tablet 1,000 mg, 1,000 mg, Oral, QHS, Lisbeth Renshaw, MD   lactated ringers infusion, , Intravenous, Continuous, Lisbeth Renshaw, MD, Last Rate: 10 mL/hr at 04/08/23 1705, New Bag at 04/08/23 2030   levothyroxine (SYNTHROID) tablet 125 mcg, 125 mcg, Oral, Daily, Lisbeth Renshaw, MD, 125 mcg at 04/09/23 0613   lidocaine (LIDODERM) 5 % 1 patch, 1 patch, Transdermal, Q24H, Lisbeth Renshaw, MD, 1 patch at 04/08/23 1151   menthol-cetylpyridinium (CEPACOL) lozenge 3 mg, 1 lozenge, Oral, PRN **OR** phenol (CHLORASEPTIC) mouth spray 1 spray, 1 spray, Mouth/Throat, PRN, Lisbeth Renshaw, MD   methocarbamol (ROBAXIN) tablet 500 mg, 500 mg, Oral, Q6H PRN **OR** methocarbamol (ROBAXIN) 500 mg in dextrose 5 % 50 mL IVPB, 500 mg, Intravenous, Q6H PRN, Lisbeth Renshaw, MD   morphine (PF) 2 MG/ML injection 2 mg, 2 mg, Intravenous, Q2H PRN, Lisbeth Renshaw, MD, 2 mg at 04/09/23 0937   ondansetron (ZOFRAN) tablet 4 mg, 4 mg, Oral, Q6H PRN **OR** ondansetron (ZOFRAN) injection 4 mg, 4 mg, Intravenous, Q6H PRN, Lisbeth Renshaw, MD   Oral care mouth rinse, 15 mL, Mouth Rinse, PRN, Lisbeth Renshaw, MD   oxyCODONE (Oxy IR/ROXICODONE)  immediate release tablet 10 mg, 10 mg, Oral, Q3H PRN, Lisbeth Renshaw, MD   pantoprazole (PROTONIX) injection 40 mg, 40 mg, Intravenous, QHS, Nundkumar, Marlane Hatcher, MD, 40 mg at 04/08/23 2259   perphenazine (TRILAFON) tablet 16 mg, 16 mg, Oral, QHS, Nundkumar, Neelesh, MD   phenelzine (NARDIL) tablet 30 mg, 30 mg, Oral, BID, Lisbeth Renshaw, MD, 30 mg at 04/09/23 0900   polyethylene glycol (MIRALAX / GLYCOLAX) packet 34 g, 34 g, Oral, Daily, Lisbeth Renshaw, MD, 34 g at 04/07/23 0912   senna-docusate (Senokot-S) tablet 1 tablet, 1 tablet, Oral, QHS, Nundkumar, Marlane Hatcher, MD, 1 tablet at 04/07/23 2239   sodium chloride flush (NS) 0.9 % injection 3 mL, 3 mL, Intravenous, Q12H, Nundkumar, Marlane Hatcher, MD, 3 mL at 04/09/23 1031   sodium chloride flush (NS) 0.9 % injection 3 mL, 3 mL, Intravenous, PRN, Lisbeth Renshaw, MD  Allergies: Allergies  Allergen Reactions   Lamictal [Lamotrigine] Rash   Metformin And Related Rash       Objective  Vital signs:  Temp:  [96.1 F (35.6 C)-98 F (36.7 C)] 96.1 F (35.6 C) (10/04 0800) Pulse Rate:  [72-110] 99 (10/04 0600) Resp:  [10-18] 16 (10/04 0600) BP: (149-168)/(83-116) 153/95 (10/04 0600) SpO2:   [94 %-99 %] 97 % (10/04 0600) Weight:  [96.9 kg] 96.9 kg (10/03 1444)  Psychiatric Specialty Exam:  Presentation  General Appearance:  Appropriate for Environment; Casual  Eye Contact: Good  Speech: Clear and Coherent; Normal Rate  Speech Volume: Normal  Handedness:Right   Mood and Affect  Mood: Euthymic  Affect: Appropriate; Congruent   Thought Process  Thought Processes: Coherent; Goal Directed  Descriptions of Associations:Intact  Orientation:Full (Time, Place and Person)  Thought Content:Logical  History of Schizophrenia/Schizoaffective disorder: Denies Duration of Psychotic Symptoms:Denies Hallucinations:Hallucinations: None  Ideas of Reference:None  Suicidal Thoughts:Suicidal Thoughts: No  Homicidal Thoughts:Homicidal Thoughts: No   Sensorium  Memory: Immediate Fair; Recent Fair; Remote Fair  Judgment: Fair  Insight: Fair   Art therapist  Concentration: Fair  Attention Span: Fair  Recall:Fair  Fund of Knowledge:Fair  Language:Fair   Psychomotor Activity  Psychomotor Activity:Psychomotor Activity: Normal   Assets  Assets:Desire for Improvement; Manufacturing systems engineer; Resilience; Social Support   Sleep  Sleep:Sleep: Fair    Physical Exam: Physical Exam Vitals and nursing note reviewed.  Constitutional:      Appearance: Normal appearance. He is normal weight.  Neurological:     General: No focal deficit present.     Mental Status: He is alert and oriented to person, place, and time. Mental status is at baseline.     Comments: C collar in place  Psychiatric:        Mood and Affect: Mood normal.        Behavior: Behavior normal.        Thought Content: Thought content normal.        Judgment: Judgment normal.    Review of Systems  Psychiatric/Behavioral: Negative.  Negative for hallucinations and suicidal ideas.   All other systems reviewed and are negative.  Blood pressure (!) 153/95, pulse 99,  temperature (!) 96.1 F (35.6 C), temperature source Axillary, resp. rate 16, height 5' 2.5" (1.588 m), weight 96.9 kg, SpO2 97%. Body mass index is 38.45 kg/m.

## 2023-04-09 NOTE — Progress Notes (Signed)
Inpatient Rehab Admissions Coordinator:   Per therapy recommendations pt was screened for CIR by Estill Dooms, PT, DPT.  At this time, pt could be a potential candidate so I will place a rehab order per our protocol. Of note, we will need to verify adequate caregiver support.  Please see ED documentation from 9/27 and 9/28.  Also of note, CIR beds are extremely limited at this time, so if pt is agreeable he may benefit from referral to [facility] or [facility].    Estill Dooms, PT, DPT Admissions Coordinator [phone] 04/09/23  3:43 PM

## 2023-04-09 NOTE — Consult Note (Signed)
NAME:  [name], MRN:  [id number], DOB:  [birth year], LOS: 6 ADMISSION DATE:  04/03/2023, CONSULTATION DATE:  04/09/23 REFERRING MD:  Lelon Mast CHIEF COMPLAINT:  AMS   History of Present Illness:  [name] is a 62 y.o. male who has a PMH as below including but not limited to A.fib, seizures, schizoaffective disorder, MDD and concern for developing dementia. He was admitted 9/28 to [facility] for AMS. He had had several falls prior to this and workup on admission revealed C5 fx. He was placed in C-collar and neurosurgery consultation was consulted with recs to manage conservatively initially with plans for operative stabilization in the upcoming days.  9/28 admit, C5 fx found, placed in C-collar 9/29 psych was consulted, PTA meds adjusted to reduce anticholingeric SE's. 10/3 to OR for Discectomy at C5-6, placement of intervertebral biomechanical device, placement of anterior instrumentation consisting of interbody plate and screws, use of bone allograft, arthrodesis C5-6.  Post operatively, he was transferred to the ICU for closer monitoring. PCCM consulted 10/4 for ICU management.  Pertinent  Medical History:  has Seborrheic dermatitis; T2DM (type 2 diabetes mellitus) (HCC); Hypothyroidism; Seizures (HCC); Persistent atrial fibrillation (HCC); BMI 34.0-34.9,adult; Acute bilateral low back pain without sciatica; Erectile dysfunction; Varicose veins of bilateral lower extremities with other complications; Schizoaffective disorder, depressive type (HCC); Loss of weight; Rosacea; Paronychia of left index finger; Vertigo; Chronic lumbar radiculopathy; Hyperlipidemia associated with type 2 diabetes mellitus (HCC); Spinal stenosis of lumbar region; Statin intolerance; Bilateral primary osteoarthritis of hip; DISH (diffuse idiopathic skeletal hyperostosis); Belching; C5 cervical fracture (HCC); Multiple falls; and Disorientation on their problem list.  Significant Hospital Events: Including procedures,  antibiotic start and stop dates in addition to other pertinent events   9/28 admit, C5 fx found, placed in C-collar. 9/29 psych was consulted, PTA meds adjusted to reduce anticholingeric SE's. 10/3 to OR for Discectomy at C5-6, placement of intervertebral biomechanical device, placement of anterior instrumentation consisting of interbody plate and screws, use of bone allograft, arthrodesis C5-6.  Interim History / Subjective:  Stable post op. Asking for pain meds. Has tremor of bilateral hands (chronic)  Objective:  Blood pressure (!) 153/95, pulse 99, temperature (!) 96.1 F (35.6 C), temperature source Axillary, resp. rate 16, height 5' 2.5" (1.588 m), weight 96.9 kg, SpO2 97%.        Intake/Output Summary (Last 24 hours) at 04/09/2023 1004 Last data filed at 04/09/2023 0433 Gross per 24 hour  Intake 1252.5 ml  Output 850 ml  Net 402.5 ml   Filed Weights   04/04/23 0044 04/08/23 1444  Weight: 96.9 kg 96.9 kg    Examination: General: Adult male, resting in bed, in NAD. Neuro: Awake, MAE's, EOMI. HEENT: Cervical collar in place. [place]/AT. Sclerae anicteric. Anterior cervical incision site C/D/I with JP drain in place. Cardiovascular: RRR, no M/R/G.  Lungs: Respirations even and unlabored.  CTA bilaterally, No W/R/R. Abdomen: BS x 4, soft, NT/ND.  Musculoskeletal: No gross deformities, no edema.  Skin: Intact, warm, no rashes.  Labs/imaging personally reviewed:  MRI Cspine 9/28 > C5-6 fx, acute ligamentous injury involving anterior longitudinal ligament with suspected additional ligamentous injury of the posterior longitudinal ligament and posterior interspinous ligaments. No traumatic cord injury. Diffuse bridging osteophytic spurring with secondary bony ankylosis throughout the visualized cervicothoracic spine, compatible with DISH. Underlying multilevel cervical spondylosis, most pronounced at C5-6 where there is resultant moderate to severe spinal stenosis. Severe bilateral C6 and  C7 foraminal stenosis.   Assessment & Plan:  C5 fx - s/p surgical repair 10/3. - Post op care per NSGY. - Maintain c-collar until cleared by NSGY. - Pain control.  Hx multiple falls. - PT/OT. - Will likely require placement at SNF/assisted living. - Fall precautions.  DM2. - SSI if glucose consistently > 180.  Hx HLD. - Continue Atorvastatin.  Hx Hypothyroidism. - Continue Synthroid.  Hx MDD, schizoaffective disorder, tremor, concern for underlying developing dementia. - Psych consulted, appreciate the assistance. - Continue Phenelzine, Perphenazine, Depakote, Cogentin.  Nutrition. - Advance diet.  Best practice (evaluated daily):  Diet/type: Regular consistency (see orders) DVT prophylaxis: SCD GI prophylaxis: N/A Lines: N/A Foley:  N/A Code Status:  full code Last date of multidisciplinary goals of care discussion: None yet.  Labs   CBC: Recent Labs  Lab 04/02/23 1550 04/03/23 1804 04/03/23 1822 04/04/23 1145 04/05/23 0750 04/07/23 0523  WBC 8.9 12.1*  --  8.7 9.6 8.8  NEUTROABS 6.6 8.8*  --   --   --   --   HGB 14.6 15.4 17.3* 13.9 13.1 13.4  HCT 44.8 47.3 51.0 41.8 39.6 38.9*  MCV 90.0 89.8  --  90.7 88.4 88.8  PLT 184 178  --  149* 139* 142*    Basic Metabolic Panel: Recent Labs  Lab 04/02/23 1550 04/03/23 1804 04/03/23 1822 04/04/23 0536 04/05/23 0750  NA 135 141 139 137 136  K 3.7 3.4* 3.3* 4.1 3.6  CL 100 97* 99 102 98  CO2 25 26  --  20* 26  GLUCOSE 124* 133* 125* 106* 100*  BUN 13 13 15 12 8   CREATININE 1.05 0.90 0.80 0.73 0.80  CALCIUM 8.8* 9.8  --  8.7* 8.6*  MG 1.8  --   --   --   --    GFR: Estimated Creatinine Clearance: 95.3 mL/min (by C-G formula based on SCr of 0.8 mg/dL). Recent Labs  Lab 04/03/23 1804 04/03/23 1822 04/04/23 1145 04/05/23 0750 04/07/23 0523  WBC 12.1*  --  8.7 9.6 8.8  LATICACIDVEN  --  1.7  --   --   --     Liver Function Tests: Recent Labs  Lab 04/02/23 1550 04/03/23 1804  AST 15 36   ALT 8 11  ALKPHOS 82 91  BILITOT 0.9 1.2  PROT 6.4* 7.4  ALBUMIN 3.5 3.9   No results for input(s): "LIPASE", "AMYLASE" in the last 168 hours. Recent Labs  Lab 04/02/23 1545  AMMONIA 20    ABG    Component Value Date/Time   TCO2 26 04/03/2023 1822     Coagulation Profile: No results for input(s): "INR", "PROTIME" in the last 168 hours.  Cardiac Enzymes: Recent Labs  Lab 04/03/23 1804  CKTOTAL 846*    HbA1C: HbA1c, POC (prediabetic range)  Date/Time Value Ref Range Status  12/14/2022 10:31 AM 6.4 5.7 - 6.4 % Final   HbA1c, POC (controlled diabetic range)  Date/Time Value Ref Range Status  09/15/2021 10:11 AM 6.7 0.0 - 7.0 % Final  12/18/2020 10:04 AM 6.7 0.0 - 7.0 % Final   Hgb A1c MFr Bld  Date/Time Value Ref Range Status  04/04/2023 11:45 AM 6.4 (H) 4.8 - 5.6 % Final    Comment:    (NOTE) Pre diabetes:          5.7%-6.4%  Diabetes:              >6.4%  Glycemic control for   <7.0% adults with diabetes   09/08/2022 03:25 PM 9.0 (H) 4.8 -  5.6 % Final    Comment:             Prediabetes: 5.7 - 6.4          Diabetes: >6.4          Glycemic control for adults with diabetes: <7.0     CBG: Recent Labs  Lab 04/07/23 2129 04/08/23 0643 04/08/23 1241 04/08/23 1501 04/08/23 1701  GLUCAP 118* 93 99 79 97    Review of Systems:   All negative; except for those that are bolded, which indicate positives.  Constitutional: weight loss, weight gain, night sweats, fevers, chills, fatigue, weakness.  HEENT: headaches, sore throat, sneezing, nasal congestion, post nasal drip, difficulty swallowing, tooth/dental problems, visual complaints, visual changes, ear aches, neck pain following surgery. Neuro: difficulty with speech, weakness, numbness, ataxia. CV:  chest pain, orthopnea, PND, swelling in lower extremities, dizziness, palpitations, syncope.  Resp: cough, hemoptysis, dyspnea, wheezing. GI: heartburn, indigestion, abdominal pain, nausea, vomiting,  diarrhea, constipation, change in bowel habits, loss of appetite, hematemesis, melena, hematochezia.  GU: dysuria, change in color of urine, urgency or frequency, flank pain, hematuria. MSK: joint pain or swelling, decreased range of motion. Psych: change in mood or affect, depression, anxiety, suicidal ideations, homicidal ideations. Skin: rash, itching, bruising.   Past Medical History:  He,  has a past medical history of A-fib [facility]), Acute right-sided low back pain, Arrhythmia, Chronic a-fib (HCC), Closed fracture of left distal radius, Contact dermatitis (04/16/2017), Depression, Depression, Hypothyroidism, Low back pain, Morbid obesity, BMI unknown (HCC), Osteoarthritis, Schizophrenia (HCC), Seizures (HCC), Thyroid disease, and Varicose veins.   Surgical History:   Past Surgical History:  Procedure Laterality Date   ACHILLES TENDON REPAIR  approx. 2004   right foot   CATARACT EXTRACTION     bilateral   OPEN REDUCTION INTERNAL FIXATION (ORIF) DISTAL RADIAL FRACTURE Left 07/21/2018   Procedure: OPEN REDUCTION INTERNAL FIXATION (ORIF) DISTAL RADIAL FRACTURE;  Surgeon: Betha Loa, MD;  Location: [facility];  Service: Orthopedics;  Laterality: Left;   stab phlebectomy  Right 11/09/2016   stab phlebectomy R leg by Josephina Gip MD   TOOTH EXTRACTION       Social History:   reports that he has never smoked. He has never used smokeless tobacco. He reports that he does not drink alcohol and does not use drugs.   Family History:  His family history includes Diabetes in his brother and father; Heart disease in his father; OCD in his mother.   Allergies Allergies  Allergen Reactions   Lamictal [Lamotrigine] Rash   Metformin And Related Rash     Home Medications  Prior to Admission medications   Medication Sig Start Date End Date Taking? Authorizing Provider  acetaminophen (TYLENOL) 500 MG tablet Take 500-1,000 mg by mouth 2 (two) times daily as needed for mild pain.    Yes [provider]  allopurinol (ZYLOPRIM) 300 MG tablet TAKE 1 TABLET BY MOUTH EVERY DAY 10/01/22  Yes Maury Dus, MD  apixaban (ELIQUIS) 5 MG TABS tablet Take 1 tablet (5 mg total) by mouth 2 (two) times daily. 10/19/22  Yes McDiarmid, Leighton Roach, MD  atorvastatin (LIPITOR) 40 MG tablet TAKE 1 TABLET BY MOUTH EVERY DAY 04/15/22  Yes Lilland, Alana, DO  benztropine (COGENTIN) 1 MG tablet Take 1 tablet (1 mg total) by mouth daily. 02/23/23  Yes Arfeen, Phillips Grout, MD  divalproex (DEPAKOTE ER) 500 MG 24 hr tablet Take 1 tablet (500 mg total) by mouth 2 (two) times daily.  02/23/23  Yes Arfeen, Phillips Grout, MD  levothyroxine (SYNTHROID) 100 MCG tablet TAKE 1 TABLET BY MOUTH EVERY DAY 11/27/22  Yes Lilland, Alana, DO  perphenazine (TRILAFON) 8 MG tablet Take 3 tablets (24 mg total) by mouth at bedtime. Patient taking differently: Take 24 mg by mouth 2 (two) times daily. 02/23/23  Yes Arfeen, Phillips Grout, MD  phenelzine (NARDIL) 15 MG tablet Take 3 tablets (45 mg total) by mouth 2 (two) times daily. 02/23/23  Yes Arfeen, Phillips Grout, MD  Semaglutide, 1 MG/DOSE, 4 MG/3ML SOPN Inject 1 mg into the skin once a week. 02/19/23  Yes Tiffany Kocher, DO     Critical care time: N/A.   Rutherford Guys, PA - [facility] For pager details, please see AMION or use Epic chat  After 1900, please call [facility] for cross coverage needs 04/09/2023, 10:04 AM

## 2023-04-09 NOTE — Addendum Note (Signed)
Addendum  created 04/09/23 1610 by Sandie Ano, CRNA   Intraprocedure Meds edited

## 2023-04-09 NOTE — Assessment & Plan Note (Deleted)
A little bit of neck pain.  Understands he is going for surgery today.  No new complaints.  - NSGY scheduling his surgery for Thursday afternoon 10/3.  Neurosurgery will reach out to discuss postop planning. - Continue Tylenol 650 mg every 6 hours.  Added lidocaine patch for right shoulder pain. - Discontinued heparin. - NPO at midnight 10/3

## 2023-04-09 NOTE — Assessment & Plan Note (Deleted)
Appears more oriented today: Name, date of birth, hospital, [place], year.  Per psych: suspicion of some underlying dementia.  No clear pattern of AMS.  Continued poor attention, has to be reoriented to conversation. - Neuro checks q4h. - Psychiatry to follow up on Friday post-surgery. - Continue patient's psychiatric medications as below

## 2023-04-09 NOTE — Evaluation (Signed)
Physical Therapy Evaluation Patient Details Name: [name] MRN: [id number] DOB: 1960/07/14 Today's Date: 04/09/2023  History of Present Illness  Pt is a 62 y.o. male presenting via [facility] on 9/28 after recurrent falls. MRI cervical spine revealed acute fx extending through a bridging osteophyte at C5-6,  with extension through the underlying superior endplate of C6.  Subtle widening of the C5-6 interspace. Overall, constellation of  findings are consistent with an acute hyperextension injury. Acute anterior longitudinal ligament injury. S/p ACDF C5-6 10/3. PMH significant for significant for seizures, hypothyroidism, A-fib anticoagulated with Eliquis, schizophrenia, diabetes.   Clinical Impression  Pt presents with condition above and deficits mentioned below, see PT Problem List. Pt demonstrates deficits in cognition and is thus a poor historian. He reports he was living alone and independent PTA. He demonstrates tremors in his bil upper and lower extremities in addition to deficits in gross coordination, strength, balance, and activity tolerance. He is at high risk for falls. At this time, pt is needing maxA to transition sidelying to sit EOB, minAx2 for transfers to stand, and modA x2 for safety to ambulate short distances in the room with a RW for support. He could greatly benefit from intensive inpatient rehab, > 3 hours/day. Will continue to follow acutely.        If plan is discharge home, recommend the following: A lot of help with walking and/or transfers;A lot of help with bathing/dressing/bathroom;Assistance with cooking/housework;Direct supervision/assist for financial management;Direct supervision/assist for medications management;Assist for transportation;Help with stairs or ramp for entrance;Supervision due to cognitive status   Can travel by private vehicle        Equipment Recommendations Rolling walker (2 wheels);BSC/3in1;Wheelchair (measurements PT);Wheelchair cushion  (measurements PT);Hospital bed (pending progress)  Recommendations for Other Services  Rehab consult    Functional Status Assessment Patient has had a recent decline in their functional status and demonstrates the ability to make significant improvements in function in a reasonable and predictable amount of time.     Precautions / Restrictions Precautions Precautions: Fall;Cervical Precaution Booklet Issued: No Precaution Comments: tremors Required Braces or Orthoses: Cervical Brace Cervical Brace: Hard collar;Other (comment) (May remove when in bed, apply/remove in sitting, may remove to shower, may NOT ambulate to bathroom without brace) Restrictions Weight Bearing Restrictions: No      Mobility  Bed Mobility Overal bed mobility: Needs Assistance Bed Mobility: Rolling, Sidelying to Sit Rolling: Mod assist, Used rails Sidelying to sit: Max assist, HOB elevated       General bed mobility comments: Pt cued for log roll technique, using L bed rail to roll to L, modA at trunk. MaxA to guide legs off L EOB and ascend trunk, needing cues to release bedrail with L UE.    Transfers Overall transfer level: Needs assistance Equipment used: Rolling walker (2 wheels) Transfers: Sit to/from Stand, Bed to chair/wheelchair/BSC Sit to Stand: Min assist, +2 physical assistance, +2 safety/equipment   Step pivot transfers: Mod assist, +2 safety/equipment       General transfer comment: Pt pulled up on RW with bil UEs, needing minAx2 to power up to stand and gain balance. ModA, +2 for safety, to guide RW and maintain pt's balance and safety when performing a step pivot transfer to the L from EOB to recliner. Pt takes slow, small, shuffling steps with bil knees flexing excessively in stance but not fully buckling, tremulous in LEs and UEs.    Ambulation/Gait Ambulation/Gait assistance: Mod assist, +2 safety/equipment Gait Distance (Feet): 12 Feet Assistive  device: Rolling walker (2  wheels) Gait Pattern/deviations: Step-to pattern, Decreased step length - right, Decreased step length - left, Decreased stride length, Decreased dorsiflexion - right, Decreased dorsiflexion - left, Knee flexed in stance - right, Knee flexed in stance - left, Shuffle, Narrow base of support Gait velocity: reduced Gait velocity interpretation: <1.31 ft/sec, indicative of household ambulator   General Gait Details: ModA, +2 for safety, to guide RW and maintain pt's balance and safety when performing a step pivot transfer to the L from EOB to recliner and then to ambulate anterior and posterior in front of recliner. Pt takes slow, small, shuffling steps with bil knees flexing excessively in stance but not fully buckling, tremulous in LEs and UEs. Tactile and verbal cues provided for longer steps, poor carryover though as cues faded.  Stairs            Wheelchair Mobility     Tilt Bed    Modified Rankin (Stroke Patients Only)       Balance Overall balance assessment: Needs assistance Sitting-balance support: Single extremity supported, Bilateral upper extremity supported, Feet supported Sitting balance-Leahy Scale: Poor Sitting balance - Comments: UE support, CGA-minA for static sitting balance.   Standing balance support: Bilateral upper extremity supported, During functional activity, Reliant on assistive device for balance Standing balance-Leahy Scale: Poor Standing balance comment: Reliant on RW and external physical assistance                             Pertinent Vitals/Pain Pain Assessment Pain Assessment: Faces Faces Pain Scale: Hurts little more Pain Location: neck Pain Descriptors / Indicators: Discomfort, Grimacing, Operative site guarding Pain Intervention(s): Limited activity within patient's tolerance, Monitored during session, Repositioned    Home Living Family/patient expects to be discharged to:: Private residence Living Arrangements:  Alone Available Help at Discharge: Family (mother) Type of Home: Apartment             Additional Comments: mother lives in a house alone in [place]    Prior Function Prior Level of Function : Independent/Modified Independent;Driving                     Extremity/Trunk Assessment   Upper Extremity Assessment Upper Extremity Assessment: Defer to OT evaluation    Lower Extremity Assessment Lower Extremity Assessment: Generalized weakness (bil legs tremulous)    Cervical / Trunk Assessment Cervical / Trunk Assessment: Neck Surgery  Communication   Communication Communication: No apparent difficulties  Cognition Arousal: Alert Behavior During Therapy: WFL for tasks assessed/performed Overall Cognitive Status: No family/caregiver present to determine baseline cognitive functioning                                 General Comments: Pt tremulous in UEs and LEs. Pt asking about candy and a walker initially, not following conversation and needing redirecting. Slow to process cues. Likely not baseline if he was living alone and managing independentently, but no family present to confirm        General Comments General comments (skin integrity, edema, etc.): VSS on RA; readjusted cervical collar in bed upon arrival for better fit on pt    Exercises     Assessment/Plan    PT Assessment Patient needs continued PT services  PT Problem List Decreased strength;Decreased activity tolerance;Decreased balance;Decreased mobility;Decreased cognition;Decreased coordination;Decreased knowledge of use of DME;Decreased safety awareness;Decreased knowledge of precautions  PT Treatment Interventions DME instruction;Gait training;Stair training;Functional mobility training;Therapeutic activities;Therapeutic exercise;Balance training;Neuromuscular re-education;Cognitive remediation;Patient/family education    PT Goals (Current goals can be found in the Care Plan  section)  Acute Rehab PT Goals Patient Stated Goal: to improve PT Goal Formulation: With patient Time For Goal Achievement: 04/23/23 Potential to Achieve Goals: Good    Frequency Min 1X/week     Co-evaluation PT/OT/SLP Co-Evaluation/Treatment: Yes Reason for Co-Treatment: Necessary to address cognition/behavior during functional activity;Complexity of the patient's impairments (multi-system involvement);For patient/therapist safety;To address functional/ADL transfers PT goals addressed during session: Mobility/safety with mobility;Balance;Proper use of DME         AM-PAC PT "6 Clicks" Mobility  Outcome Measure Help needed turning from your back to your side while in a flat bed without using bedrails?: A Lot Help needed moving from lying on your back to sitting on the side of a flat bed without using bedrails?: A Lot Help needed moving to and from a bed to a chair (including a wheelchair)?: A Lot Help needed standing up from a chair using your arms (e.g., wheelchair or bedside chair)?: A Lot Help needed to walk in hospital room?: Total (<20 ft) Help needed climbing 3-5 steps with a railing? : Total 6 Click Score: 10    End of Session Equipment Utilized During Treatment: Gait belt;Cervical collar Activity Tolerance: Patient tolerated treatment well Patient left: in chair;with call bell/phone within reach;with chair alarm set Nurse Communication: Mobility status;Other (comment) (vitals) PT Visit Diagnosis: Unsteadiness on feet (R26.81);Other abnormalities of gait and mobility (R26.89);Muscle weakness (generalized) (M62.81);History of falling (Z91.81);Repeated falls (R29.6);Difficulty in walking, not elsewhere classified (R26.2)    Time: 1610-9604 PT Time Calculation (min) (ACUTE ONLY): 21 min   Charges:   PT Evaluation $PT Eval Moderate Complexity: 1 Mod   PT General Charges $$ ACUTE PT VISIT: 1 Visit         Virgil Benedict, PT, DPT Acute Rehabilitation  Services  Office: [phone]   Bettina Gavia 04/09/2023, 12:26 PM

## 2023-04-09 NOTE — Assessment & Plan Note (Deleted)
Pt has elevated TSH to 10 on admission. Pt has history of hypothyroidism, previously on 100 mcg levo. Increased to 125 mcg levothyroxine 10/1. - Denies new palpitations.

## 2023-04-09 NOTE — Evaluation (Signed)
Occupational Therapy Evaluation Patient Details Name: [name] MRN: [id number] DOB: December 23, 1960 Today's Date: 04/09/2023   History of Present Illness Pt is a 62 y.o. male presenting via PTAR on 9/28 after recurrent falls. MRI cervical spine revealed acute fx extending through a bridging osteophyte at C5-6,  with extension through the underlying superior endplate of C6.  Subtle widening of the C5-6 interspace. Overall, constellation of  findings are consistent with an acute hyperextension injury. Acute anterior longitudinal ligament injury. S/p ACDF C5-6 10/3. PMH significant for significant for seizures, hypothyroidism, A-fib anticoagulated with Eliquis, DM, MDD, Anxiety and distant dx of schizoaffective d/o and OCD   Clinical Impression   Patient is s/p ACDF surgery resulting in functional limitations due to the deficits listed below (see OT problem list). Pt living alone at baseline and indep. Pt at this time needs two person (A) for basic transfer and cognition deficits. Pt needs redirection during session to sequence task with 1 step commands.  Patient will benefit from skilled OT acutely to increase independence and safety with ADLS to allow discharge Patient will benefit from intensive inpatient follow up therapy, >3 hours/day .        If plan is discharge home, recommend the following: Two people to help with walking and/or transfers;Two people to help with bathing/dressing/bathroom    Functional Status Assessment  Patient has had a recent decline in their functional status and demonstrates the ability to make significant improvements in function in a reasonable and predictable amount of time.  Equipment Recommendations  BSC/3in1;Other (comment) (rw)    Recommendations for Other Services Rehab consult     Precautions / Restrictions Precautions Precautions: Fall;Cervical Precaution Booklet Issued: No Precaution Comments: tremors Required Braces or Orthoses: Cervical  Brace Cervical Brace: Hard collar;Other (comment) (May remove when in bed, apply/remove in sitting, may remove to shower, may NOT ambulate to bathroom without brace) Restrictions Weight Bearing Restrictions: No      Mobility Bed Mobility Overal bed mobility: Needs Assistance Bed Mobility: Rolling, Sidelying to Sit Rolling: Mod assist, Used rails Sidelying to sit: Max assist, HOB elevated       General bed mobility comments: Pt cued for log roll technique, using L bed rail to roll to L, modA at trunk. MaxA to guide legs off L EOB and ascend trunk, needing cues to release bedrail with L UE.    Transfers Overall transfer level: Needs assistance Equipment used: Rolling walker (2 wheels) Transfers: Sit to/from Stand, Bed to chair/wheelchair/BSC Sit to Stand: Min assist, +2 physical assistance, +2 safety/equipment     Step pivot transfers: Mod assist, +2 safety/equipment     General transfer comment: Pt pulled up on RW with bil UEs, needing minAx2 to power up to stand and gain balance. ModA, +2 for safety, to guide RW and maintain pt's balance and safety when performing a step pivot transfer to the L from EOB to recliner. Pt takes slow, small, shuffling steps with bil knees flexing excessively in stance but not fully buckling, tremulous in LEs and UEs.      Balance Overall balance assessment: Needs assistance Sitting-balance support: Single extremity supported, Bilateral upper extremity supported, Feet supported Sitting balance-Leahy Scale: Poor Sitting balance - Comments: UE support, CGA-minA for static sitting balance.   Standing balance support: Bilateral upper extremity supported, During functional activity, Reliant on assistive device for balance Standing balance-Leahy Scale: Poor Standing balance comment: Reliant on RW and external physical assistance  ADL either performed or assessed with clinical judgement   ADL Overall ADL's : Needs  assistance/impaired Eating/Feeding: Moderate assistance   Grooming: Moderate assistance   Upper Body Bathing: Moderate assistance   Lower Body Bathing: Maximal assistance   Upper Body Dressing : Moderate assistance   Lower Body Dressing: Maximal assistance   Toilet Transfer: +2 for physical assistance;Moderate assistance;Ambulation;BSC/3in1;Rolling walker (2 wheels) Toilet Transfer Details (indicate cue type and reason): simulated           General ADL Comments: pt needs step by step cues to help sequence task.     Vision Ability to See in Adequate Light: 0 Adequate Vision Assessment?: No apparent visual deficits Additional Comments: pt with a centralized gaze and very wide open eyes at this time for visual attention     Perception         Praxis         Pertinent Vitals/Pain Pain Assessment Pain Assessment: Faces Faces Pain Scale: Hurts little more Pain Location: neck Pain Descriptors / Indicators: Discomfort, Grimacing, Operative site guarding Pain Intervention(s): Limited activity within patient's tolerance, Monitored during session, Repositioned     Extremity/Trunk Assessment Upper Extremity Assessment Upper Extremity Assessment: Right hand dominant;RUE deficits/detail;LUE deficits/detail RUE Deficits / Details: tremor RUE Coordination: decreased gross motor;decreased fine motor LUE Deficits / Details: tremor LUE Coordination: decreased gross motor;decreased fine motor   Lower Extremity Assessment Lower Extremity Assessment: Generalized weakness   Cervical / Trunk Assessment Cervical / Trunk Assessment: Neck Surgery   Communication Communication Communication: No apparent difficulties   Cognition Arousal: Alert Behavior During Therapy: WFL for tasks assessed/performed Overall Cognitive Status: No family/caregiver present to determine baseline cognitive functioning                                 General Comments: Pt tremulous in UEs  and LEs. Pt asking about candy and a walker initially, not following conversation and needing redirecting. Slow to process cues. Likely not baseline if he was living alone and managing independentently, but no family present to confirm     General Comments  VSS on RA, pt needed supine position to adjust ccollar to proper fit and pads positioned. VTach noted on monitor with HR 143 max noted    Exercises     Shoulder Instructions      Home Living Family/patient expects to be discharged to:: Private residence Living Arrangements: Alone Available Help at Discharge: Family (mother) Type of Home: Apartment                           Additional Comments: mother lives in a house alone in [place]. no family present to confirm.      Prior Functioning/Environment Prior Level of Function : Independent/Modified Independent;Driving                        OT Problem List: Decreased strength;Decreased activity tolerance;Impaired balance (sitting and/or standing);Decreased knowledge of precautions;Decreased knowledge of use of DME or AE;Decreased safety awareness;Obesity;Impaired UE functional use;Cardiopulmonary status limiting activity;Decreased cognition;Decreased coordination      OT Treatment/Interventions: Self-care/ADL training;Therapeutic exercise;Neuromuscular education;Energy conservation;DME and/or AE instruction;Manual therapy;Modalities;Therapeutic activities;Cognitive remediation/compensation;Balance training;Patient/family education    OT Goals(Current goals can be found in the care plan section) Acute Rehab OT Goals Patient Stated Goal: to keep that paper. i need that. pt referring to therapist patient list OT Goal Formulation: Patient  unable to participate in goal setting Time For Goal Achievement: 04/23/23 Potential to Achieve Goals: Good  OT Frequency: Min 1X/week    Co-evaluation PT/OT/SLP Co-Evaluation/Treatment: Yes Reason for Co-Treatment:  Necessary to address cognition/behavior during functional activity;Complexity of the patient's impairments (multi-system involvement);For patient/therapist safety;To address functional/ADL transfers PT goals addressed during session: Mobility/safety with mobility;Balance;Proper use of DME OT goals addressed during session: ADL's and self-care;Strengthening/ROM      AM-PAC OT "6 Clicks" Daily Activity     Outcome Measure Help from another person eating meals?: A Little Help from another person taking care of personal grooming?: A Little Help from another person toileting, which includes using toliet, bedpan, or urinal?: A Lot Help from another person bathing (including washing, rinsing, drying)?: A Lot Help from another person to put on and taking off regular upper body clothing?: A Little Help from another person to put on and taking off regular lower body clothing?: A Little 6 Click Score: 16   End of Session Equipment Utilized During Treatment: Gait belt;Rolling walker (2 wheels) Nurse Communication: Mobility status;Precautions;Weight bearing status  Activity Tolerance: Patient tolerated treatment well Patient left: in chair;with call bell/phone within reach;with chair alarm set  OT Visit Diagnosis: Unsteadiness on feet (R26.81);Muscle weakness (generalized) (M62.81)                Time: 9147-8295 OT Time Calculation (min): 19 min Charges:  OT General Charges $OT Visit: 1 Visit OT Evaluation $OT Eval Moderate Complexity: 1 Mod   Brynn, OTR/L  Acute Rehabilitation Services Office: [phone] .   Mateo Flow 04/09/2023, 2:49 PM

## 2023-04-09 NOTE — Assessment & Plan Note (Deleted)
A1c 6.4. CBG checks + mSSI.  Glucoses in the low 100s over the last 24 hours.  Required 6 units of sliding scale insulin aspart. - Discontinue CBG and sliding scale in setting of well-controlled glucoses and upcoming surgery.

## 2023-04-09 NOTE — Assessment & Plan Note (Deleted)
Multiple falls in the setting of DISH.  Patient lives home alone and may not be able to sustain this.  On conversation with mother, family has already begun discussing options for assisted living, as they do not believe that he is safe at home anymore. Patient knows that he has fallen multiple times.  - PT/OT will defer until C-spine s/p stabilization 10/3. Assess afterwards.  - Fall precautions. - TOC will follow.

## 2023-04-10 DIAGNOSIS — S12491A Other nondisplaced fracture of fifth cervical vertebra, initial encounter for closed fracture: Secondary | ICD-10-CM | POA: Diagnosis not present

## 2023-04-10 DIAGNOSIS — E119 Type 2 diabetes mellitus without complications: Secondary | ICD-10-CM | POA: Diagnosis not present

## 2023-04-10 DIAGNOSIS — R251 Tremor, unspecified: Secondary | ICD-10-CM

## 2023-04-10 DIAGNOSIS — R410 Disorientation, unspecified: Secondary | ICD-10-CM | POA: Diagnosis not present

## 2023-04-10 DIAGNOSIS — R296 Repeated falls: Secondary | ICD-10-CM | POA: Diagnosis not present

## 2023-04-10 DIAGNOSIS — S12401A Unspecified nondisplaced fracture of fifth cervical vertebra, initial encounter for closed fracture: Secondary | ICD-10-CM | POA: Diagnosis not present

## 2023-04-10 MED ORDER — OXYCODONE HCL 5 MG PO TABS: 5.0000 mg | ORAL_TABLET | ORAL | Status: DC | PRN

## 2023-04-10 MED ORDER — GUAIFENESIN-DM 100-10 MG/5ML PO SYRP
5.0000 mL | ORAL_SOLUTION | ORAL | Status: DC | PRN
Start: 2023-04-10 — End: 2023-04-10

## 2023-04-10 MED ORDER — GUAIFENESIN 100 MG/5ML PO LIQD: 5.0000 mL | ORAL | Status: DC | PRN

## 2023-04-10 NOTE — Assessment & Plan Note (Addendum)
Pending disposition. -Fall precautions

## 2023-04-10 NOTE — Assessment & Plan Note (Deleted)
A1c 6.4. CBG checks + mSSI.  Glucoses in the low 100s over the last 24 hours.  Required 6 units of sliding scale insulin aspart. - Discontinue CBG and sliding scale in setting of well-controlled glucoses and upcoming surgery.

## 2023-04-10 NOTE — Assessment & Plan Note (Addendum)
Bilateral upper and lower extremity, poor gait balance - ? Secondary to medications or other neurological disorder (Question Parkinson's Disease) - Consult neurology today for input

## 2023-04-10 NOTE — Assessment & Plan Note (Addendum)
Mentation much improved today, he is A&O x 4.  He is aware of reason for hospitalization and has good insight into his current clinical condition. -Psychiatry signed off, made some medication changes.  Care appreciated. -Continue benztropine 0.5 mg daily at bedtime -Continue Depakote 1000 mg every 24 hours at bedtime -Continue phenelzine 30 mg twice daily

## 2023-04-10 NOTE — Assessment & Plan Note (Addendum)
Stable. POD # 2 ACDF (possibly some concern for esophageal injury intraoperatively).  Still has drain with minimal output. -Per neurosurgery, [place] J collar when out of bed -Hemovac to be discontinued today  -Pain regimen: Tylenol 650 mg every 6 hours Oxycodone 5 mg every 4 hours as needed -Use incentive spirometry every hour -Vital signs with neurochecks every 4 hours -PT/OT

## 2023-04-10 NOTE — Evaluation (Signed)
Clinical/Bedside Swallow Evaluation Patient Details  Name: [name] MRN: [id number] Date of Birth: 21-Jul-1960  Today's Date: 04/10/2023 Time: SLP Start Time (ACUTE ONLY): 1038 SLP Stop Time (ACUTE ONLY): 1054 SLP Time Calculation (min) (ACUTE ONLY): 16 min  Past Medical History:  Past Medical History:  Diagnosis Date   A-fib (HCC)    Acute right-sided low back pain    Arrhythmia    Chronic a-fib (HCC)    Closed fracture of left distal radius    Contact dermatitis 04/16/2017   Depression    Depression    Hypothyroidism    Low back pain    Morbid obesity, BMI unknown (HCC)    Osteoarthritis    oa in bilateral knees   Schizophrenia (HCC)    Seizures (HCC)    Thyroid disease    Varicose veins    Past Surgical History:  Past Surgical History:  Procedure Laterality Date   ACHILLES TENDON REPAIR  approx. 2004   right foot   CATARACT EXTRACTION     bilateral   OPEN REDUCTION INTERNAL FIXATION (ORIF) DISTAL RADIAL FRACTURE Left 07/21/2018   Procedure: OPEN REDUCTION INTERNAL FIXATION (ORIF) DISTAL RADIAL FRACTURE;  Surgeon: Betha Loa, MD;  Location:  SURGERY CENTER;  Service: Orthopedics;  Laterality: Left;   stab phlebectomy  Right 11/09/2016   stab phlebectomy R leg by Josephina Gip MD   TOOTH EXTRACTION     HPI:  [name] is a 62 yo male presenting to ED 9/28 with AMS after falling. CT and MRI demonstrate flowing anterior cervical osteophytes consistent with DISH and C5-6 fx. Underwent ACDF C5-6 10/3. PMH includes seizure disorder, schizophrenia, A-fib on Eliquis    Assessment / Plan / Recommendation  Clinical Impression  Pt reports no difficutly swallowing s/p ACDF 10/3. Oral motor exam WFL. Observed pt with trials of thin liquids, purees, and solids which he self-fed independently with no overt s/s of dysphagia or aspiration. Provided education regarding aspiration precautions, with which pt verbalized understanding. He may benefit from increased  assistance with feeding. Recommend continuing diet of regular texture solids with thin liquids. No further SLP f/u is needed at this time. Will s/o acutely. SLP Visit Diagnosis: Dysphagia, unspecified (R13.10)    Aspiration Risk  Mild aspiration risk    Diet Recommendation Regular;Thin liquid    Liquid Administration via: Cup;Straw Medication Administration: Whole meds with puree Supervision: Staff to assist with self feeding Compensations: Slow rate;Small sips/bites Postural Changes: Seated upright at 90 degrees    Other  Recommendations Oral Care Recommendations: Oral care BID    Recommendations for follow up therapy are one component of a multi-disciplinary discharge planning process, led by the attending physician.  Recommendations may be updated based on patient status, additional functional criteria and insurance authorization.  Follow up Recommendations No SLP follow up      Assistance Recommended at Discharge    Functional Status Assessment Patient has not had a recent decline in their functional status  Frequency and Duration            Prognosis Prognosis for improved oropharyngeal function: Good      Swallow Study   General HPI: [name] is a 62 yo male presenting to ED 9/28 with AMS after falling. CT and MRI demonstrate flowing anterior cervical osteophytes consistent with DISH and C5-6 fx. Underwent ACDF C5-6 10/3. PMH includes seizure disorder, schizophrenia, A-fib on Eliquis Type of Study: Bedside Swallow Evaluation Previous Swallow Assessment: none in chart Diet Prior  to this Study: Regular;Thin liquids (Level 0) Temperature Spikes Noted: No Respiratory Status: Room air History of Recent Intubation: No Behavior/Cognition: Alert;Cooperative;Pleasant mood Oral Cavity Assessment: Within Functional Limits Oral Care Completed by SLP: No Oral Cavity - Dentition: Adequate natural dentition Vision: Functional for self-feeding Self-Feeding Abilities: Needs  assist Patient Positioning: Upright in bed Baseline Vocal Quality: Normal Volitional Cough: Strong Volitional Swallow: Able to elicit    Oral/Motor/Sensory Function Overall Oral Motor/Sensory Function: Within functional limits   Ice Chips Ice chips: Not tested   Thin Liquid Thin Liquid: Within functional limits Presentation: Straw;Self Fed    Nectar Thick Nectar Thick Liquid: Not tested   Honey Thick Honey Thick Liquid: Not tested   Puree Puree: Within functional limits Presentation: Spoon;Self Fed   Solid     Solid: Within functional limits Presentation: Self Fed      Gwynneth Aliment, M.A., CF-SLP Speech Language Pathology, Acute Rehabilitation Services  Secure Chat preferred [phone]  04/10/2023,11:07 AM

## 2023-04-10 NOTE — Progress Notes (Addendum)
Daily Progress Note Intern Pager: [phone]  Patient name: [name] Medical record number: [id number] Date of birth: 28-Jan-1961 Age: 62 y.o. Gender: male  Primary Care Provider: Tiffany Kocher, DO Consultants: Neurosurgery Code Status: FULL  Pt Overview and Major Events to Date:  9/28: Admitted for AMS.  C5 fracture found on imaging.  C-spine collar placed. 9/29: Psych consulted, decreased his home psych medications to reduce anticholinergic side effects. 9/30: Patient mentation improved.  Begun on heparin 5000 units every 8 hours. 10/1: Reversible causes of dementia (B12, RPR).  B12 negative. 10/3- Operation with neurosurgery: ACDF C5-C6 and overnight observation in neuro-ICU 10/4- Psychiatry signed off 10/5- FMTS resumed care on med-tele floor  Assessment and Plan: [name] is a 62 year old male admitted s/p recurrent falls with C5 fracture, s/p ACDF C5-C6 with neurosurgery, doing well, pending disposition possibly to be admitted to inpatient rehab. Assessment & Plan C5 cervical fracture (HCC) Stable. POD # 2 ACDF (possibly some concern for esophageal injury intraoperatively).  Still has drain with minimal output. -Per neurosurgery, [place] J collar when out of bed -Hemovac to be discontinued today  -Pain regimen: Tylenol 650 mg every 6 hours Oxycodone 5 mg every 4 hours as needed -Use incentive spirometry every hour -Vital signs with neurochecks every 4 hours -PT/OT Disorientation Mentation much improved today, he is A&O x 4.  He is aware of reason for hospitalization and has good insight into his current clinical condition. -Psychiatry signed off, made some medication changes.  Care appreciated. -Continue benztropine 0.5 mg daily at bedtime -Continue Depakote 1000 mg every 24 hours at bedtime -Continue phenelzine 30 mg twice daily Multiple falls Pending disposition. -Fall precautions Persistent atrial fibrillation (HCC) Rates controlled. -Eliquis  currently being held, will resume on 10/8 per neurosurgery.   Tremor Bilateral upper and lower extremity, poor gait balance - ? Secondary to medications or other neurological disorder (Question Parkinson's Disease) - Consult neurology today for input   Chronic and Stable Problems: Type 2 diabetes mellitus-monitor CBG with daily labs Hypothyroidism: Continue levothyroxine 125 mcg daily   FEN/GI: Regular PPx: SCDs for now, out of bed.  Resume Eliquis 10/8. Dispo: Possibly CIR, pending placement    Subjective:  Doing well today.  Denies any pain.  He says he is hungry.  Requested a dessert menu. He has no questions, concerns or complaints.  Objective: Temp:  [97.4 F (36.3 C)-98.5 F (36.9 C)] 97.4 F (36.3 C) (10/05 0737) Pulse Rate:  [74-98] 80 (10/05 0737) Resp:  [12-18] 18 (10/05 0737) BP: (120-142)/(79-95) 142/84 (10/05 0737) SpO2:  [91 %-95 %] 95 % (10/05 0737) Physical Exam: General: Pleasant, chronically ill but well appearing, appears older than stated age, sitting upright in the bed, C-collar in place. Wears corrective lenses. Cardiovascular: RRR Respiratory: Normal WOB on room air Abdomen: Soft, NTND Extremities: Warm, well-perfused Neuro: A&O x4, bilateral tremor in upper extremities  Laboratory: Most recent CBC Lab Results  Component Value Date   WBC 8.8 04/07/2023   HGB 13.4 04/07/2023   HCT 38.9 (L) 04/07/2023   MCV 88.8 04/07/2023   PLT 142 (L) 04/07/2023   Most recent BMP    Latest Ref Rng & Units 04/05/2023    7:50 AM  BMP  Glucose 70 - 99 mg/dL 147   BUN 8 - 23 mg/dL 8   Creatinine 8.29 - 5.62 mg/dL 1.30   Sodium 865 - 784 mmol/L 136   Potassium 3.5 - 5.1 mmol/L 3.6   Chloride 98 -  111 mmol/L 98   CO2 22 - 32 mmol/L 26   Calcium 8.9 - 10.3 mg/dL 8.6     Anthony Dash, DO 04/10/2023, 9:16 AM  PGY-3, [facility] FPTS Intern pager: [phone], text pages welcome Secure chat group [facility] Teaching  Service   I have personally seen and examined this patient and reviewed their chart. I have discussed this patient with the resident. I agree with the assessment and plan as outlined.   Referred to outpatient Neurology for U/LE tremors and gait instability contributing to multiple falls, not yet evaluated. MR brain 2017 with chronic microvascular changes and atrophy.  Denies lumbar pain or leg weakness contributing to falls. No LOC or preceding symptoms. Has difficulty some with walking on flat ground in addition to steps, sometimes gets tripped up on gravel.  LE strength 3/5 b/l on my exam today with bilateral U/LE tremor. Remains in C collar.  Consult Neurology for evaluation given concern for ongoing imbalance and coordination deficits contributing to frequent falls.   Ellwood Dense, DO Family Medicine Teaching Service

## 2023-04-10 NOTE — Assessment & Plan Note (Deleted)
Pt has elevated TSH to 10 on admission. Pt has history of hypothyroidism, previously on 100 mcg levo. Increased to 125 mcg levothyroxine 10/1. - Denies new palpitations.

## 2023-04-10 NOTE — Consult Note (Addendum)
NEUROLOGY CONSULTATION  Reason for Consult: Tremors, unsteady gait  CC: Altered mental status  HPI   [name] is a 62 y.o. male with a past medical history of afib, depression, schizophrenia, OCD, thyroid disease, varicose veins, and back pain who initially came in on 04/03/2023 after multiple falls.  He was found to have a closed nondisplaced fracture of his fifth cervical vertebra and neurosurgery was consulted.  On 04/08/2023 he had a ACDF of C5-C6 with Dr. Conchita Paris.   Follows closely with his outpatient Psychiatrist (Dr. Lolly Mustache) last seen 02/2023 and is due for a follow up appointment this month. He has been stable on Depakote, Nardil, perphenazine and Cogentin for many years and has had a tremor noted in previous psychiatric visits. Psychiatry was consulted inpatient and they decreased some of his psychiatric medications to help mitigate anticholinergic side effects. In talking to him and his mother, he states that he has been on the same medications for close to 40 years and he has had a tremor for a long time as well. She has noticed problems with his memory over the last couple of months (ie. Locking himself out of his apartment, locking his keys in the car, getting lost while driving), but the worst episode was on Friday when he lost his car and cannot remember where he parked it or how he got home. He does have an appointment with [facility] in Dec 2024. He will additionally need to follow up with his Psychiatrist for further management and titration of his medications.   Current home medication regimen  Benztropine 1mg  daily Perphenazine 24mg  BID Phenelzine 45mg  BID Depakote 500mg  BID  Current Inpatient Medication Regimen Benztropine 0.5mg  HS Depakote 1000mg  HS Perphenine 16mg  HS Phenelzine 30mg  BID  Significant Events 9/28: Admitted for AMS.  C5 fracture found on imaging.  C-spine collar placed. 9/29: Psych consulted, decreased his home psych medications to reduce anticholinergic  side effects. 9/30: Patient mentation improved.  Begun on heparin 5000 units every 8 hours. 10/1: Reversible causes of dementia labs.  B12 and RPR negative. 10/3- Operation with neurosurgery: ACDF C5-C6 and overnight observation in neuro-ICU 10/4- Psychiatry signed off 10/5- FMTS resumed care on med-tele floor  History is obtained from:Chart review, patient    ROS: Pertinent items noted in HPI and remainder of comprehensive ROS otherwise negative.  Past Medical History:  Past Medical History:  Diagnosis Date   A-fib (HCC)    Acute right-sided low back pain    Arrhythmia    Chronic a-fib (HCC)    Closed fracture of left distal radius    Contact dermatitis 04/16/2017   Depression    Depression    Hypothyroidism    Low back pain    Morbid obesity, BMI unknown (HCC)    Osteoarthritis    oa in bilateral knees   Schizophrenia (HCC)    Seizures (HCC)    Thyroid disease    Varicose veins     Family History Family History  Problem Relation Age of Onset   Diabetes Father    Heart disease Father    Diabetes Brother    OCD Mother     Allergies:  Allergies  Allergen Reactions   Lamictal [Lamotrigine] Rash   Metformin And Related Rash    Social History:   reports that he has never smoked. He has never used smokeless tobacco. He reports that he does not drink alcohol and does not use drugs.    Medications Medications Prior to Admission  Medication Sig  Dispense Refill   acetaminophen (TYLENOL) 500 MG tablet Take 500-1,000 mg by mouth 2 (two) times daily as needed for mild pain.     allopurinol (ZYLOPRIM) 300 MG tablet TAKE 1 TABLET BY MOUTH EVERY DAY 90 tablet 1   apixaban (ELIQUIS) 5 MG TABS tablet Take 1 tablet (5 mg total) by mouth 2 (two) times daily. 180 tablet 2   atorvastatin (LIPITOR) 40 MG tablet TAKE 1 TABLET BY MOUTH EVERY DAY 90 tablet 1   benztropine (COGENTIN) 1 MG tablet Take 1 tablet (1 mg total) by mouth daily. 30 tablet 2   divalproex (DEPAKOTE ER) 500  MG 24 hr tablet Take 1 tablet (500 mg total) by mouth 2 (two) times daily. 60 tablet 2   levothyroxine (SYNTHROID) 100 MCG tablet TAKE 1 TABLET BY MOUTH EVERY DAY 90 tablet 1   perphenazine (TRILAFON) 8 MG tablet Take 3 tablets (24 mg total) by mouth at bedtime. (Patient taking differently: Take 24 mg by mouth 2 (two) times daily.) 180 tablet 0   phenelzine (NARDIL) 15 MG tablet Take 3 tablets (45 mg total) by mouth 2 (two) times daily. 360 tablet 0   Semaglutide, 1 MG/DOSE, 4 MG/3ML SOPN Inject 1 mg into the skin once a week. 3 mL 1    EXAMINATION  Current vital signs:    04/10/2023   12:34 PM 04/10/2023    7:37 AM 04/10/2023    4:43 AM  Vitals with BMI  Systolic 119 142 161  Diastolic 79 84 87  Pulse 81 80 74    Examination:  GENERAL: Awake, alert in NAD HEENT: - Normocephalic and atraumatic, dry mm, no lymphadenopathy, no Thyromegally LUNGS - Regular, unlabored respirations CV - S1S2 RRR, equal pulses bilaterally. ABDOMEN - Soft, nontender, nondistended with normoactive BS Ext: warm, well perfused, intact peripheral pulses, no pedal edema Integumentary:  Skin intact on clothed exam  NEURO:  Mental Status: AA&Ox3  Language: speech is clear.  Intact naming, repetition, fluency, and comprehension. Cranial Nerves:  II: PERRL. Visual fields full to confrontation.  III, IV, VI: EOM intact. Eyelids elevate symmetrically. Blinks to threat.  V: Sensation intact V1-3 symmetrically  VII: no facial asymmetry   VIII: hearing intact to voice IX, X: Palate elevates symmetrically. Phonation is normal.  WR:[id number] shrug 5/5 and symmetrical  XII: tongue is midline without fasciculations. Motor:  RUE:  grip   5/5    biceps  4/5    triceps  4/5      LUE: grip  5/5    biceps  4-/5    triceps  4/5 RLE: thigh  5/5    plantar flexion   5/5     dorsiflexion   5/5        LLE: thigh   5/5  plantar flexion    5/5     dorsiflexion    5/5  Tone is normal and bulk is normal Positive orbiting  fingers test on the left (orbits right finger around left).  Cogwheel rigidity is present to BUE, right worse than left.  DTRs: 3+ patellar, 2+ biceps  Sensation- Intact to light touch and temperature bilaterally Coordination: Wrist tremor at rest as well as with small-amplitude antigravity arm movements is noted in bilateral upper extremities, worse on the left.  HKS intact, no asterixis Gait- Deferred   LABS  I have reviewed labs in epic and the results pertinent to this consultation are:  Lab Results  Component Value Date   LDLCALC 64 09/08/2022  Lab Results  Component Value Date   ALT 11 04/03/2023   AST 36 04/03/2023   ALKPHOS 91 04/03/2023   BILITOT 1.2 04/03/2023   Lab Results  Component Value Date   HGBA1C 6.4 (H) 04/04/2023   Lab Results  Component Value Date   WBC 8.8 04/07/2023   HGB 13.4 04/07/2023   HCT 38.9 (L) 04/07/2023   MCV 88.8 04/07/2023   PLT 142 (L) 04/07/2023   Lab Results  Component Value Date   VITAMINB12 578 04/07/2023   Lab Results  Component Value Date   FOLATE  01/31/2007    9.4 (NOTE)  Reference Ranges        Deficient:       0.4 - 3.4 ng/mL        Indeterminate:   3.4 - 5.4 ng/mL        Normal:              > 5.4 ng/mL   Lab Results  Component Value Date   NA 136 04/05/2023   K 3.6 04/05/2023   CL 98 04/05/2023   CO2 26 04/05/2023     DIAGNOSTIC IMAGING/PROCEDURES  I have reviewed the images obtained:, as below    CT-head Atrophy, chronic microvascular disease. No acute intracranial abnormality.  MR C-spine Acute fracture extending through a bridging osteophyte at C5-6, with extension through the  underlying superior endplate of C6.  Subtle widening of the C5-6 interspace. Overall, constellation of findings are consistent with an acute hyperextension injury.  Acute ligamentous injury involving the anterior longitudinal ligament, with suspected additional ligamentous injury of the posterior longitudinal ligament and  posterior interspinous ligaments. This is consistent with an unstable injury. No evidence for traumatic cord injury.  Diffuse bridging osteophytic spurring with secondary bony ankylosis throughout the visualized cervicothoracic spine, compatible with DISH. Underlying multilevel cervical spondylosis, most pronounced at C5-6 where there is resultant moderate to severe spinal stenosis. Severe bilateral C6 and C7 foraminal stenosis.  Assessment:  62 y.o. male with a past medical history of afib, depression, schizophrenia, OCD, thyroid disease, varicose veins, and back pain who initially came in on 04/03/2023 after multiple falls.  He was found to have a closed nondisplaced fracture of his fifth cervical vertebra and neurosurgery was consulted.  On 04/08/2023 he had an ACDF of C5-C6 with Dr. Conchita Paris. He has a tremor in the bilateral upper extremities at rest and it is more significant on the right than the left. Neurology was consulted for the tremor.  - Exam reveals mild patchy findings without a single localization. Tremor at rest noted in bilateral upper extremities, worse on the left. No ataxia or asterixis seen.  - Follows closely with his outpatient Psychiatrist (Dr. Lolly Mustache) last seen 02/2023 and is due for a follow up appointment this month. He has been stable on Depakote, Nardil, perphenazine and Cogentin for many years and has had a tremor noted in previous psychiatric visits. Psychiatry was consulted inpatient and they decreased some of his psychiatric medications to help mitigate anticholinergic side effects. - CT head reveals no acute abnormalities. Chronic microvascular ischemic changes are noted.  - His tremors, which have been ongoing for many years, are most likely a side effect of long-term antipsychotic use (Tardive Tremor).  - Cognitive/memory impairment is not uncommon in the later stages of schizophrenia. Ultrastructural changes seen on Pathology specimens include greater than normal synaptic  loss and smaller average sizes of neurons. There is a paucity of information in the literature regarding whether  the cognitive and ultrastructural changes are due to long-term antipsychotic use, the disease process itself or a combination of the two.    Recommendations: - Follow up with outpatient Psychiatry this month as planned at previous visit - Follow up with Dr. Marjory Lies on Dec 9 at 0900 - Defer to Psychiatry for medication titration - PT/OT when cleared by Neurosurgery  - Resume Eliquis when cleared by Neurosurgery - Neurohospitalist service will sign off. Please call if there are additional questions.    **This documentation was dictated using Dragon Medical Software and may contain inadvertent errors **  I have seen and examined the patient. I have formulated the assessment and recommendations. 62 year old male with long-standing upper extremity tremors, most likely secondary to chronic antipsychotic use (Tardive Tremor). Psychiatry has adjusted his medications this visit. CT head reveals no acute abnormalities. Recommendations as above.  Electronically signed: Dr. Caryl Pina

## 2023-04-10 NOTE — Assessment & Plan Note (Addendum)
Rates controlled. -Eliquis currently being held, will resume on 10/8 per neurosurgery.

## 2023-04-10 NOTE — Plan of Care (Signed)
Patient ID: ALFORD GAMERO, male   DOB: [birth year], 62 y.o.   MRN: [id number]  Problem: Education: Goal: Ability to describe self-care measures that may prevent or decrease complications (Diabetes Survival Skills Education) will improve Outcome: Progressing Goal: Individualized Educational Video(s) Outcome: Progressing   Problem: Coping: Goal: Ability to adjust to condition or change in health will improve Outcome: Progressing   Problem: Fluid Volume: Goal: Ability to maintain a balanced intake and output will improve Outcome: Progressing   Problem: Health Behavior/Discharge Planning: Goal: Ability to identify and utilize available resources and services will improve Outcome: Progressing Goal: Ability to manage health-related needs will improve Outcome: Progressing   Problem: Metabolic: Goal: Ability to maintain appropriate glucose levels will improve Outcome: Progressing   Problem: Nutritional: Goal: Maintenance of adequate nutrition will improve Outcome: Progressing Goal: Progress toward achieving an optimal weight will improve Outcome: Progressing   Problem: Skin Integrity: Goal: Risk for impaired skin integrity will decrease Outcome: Progressing   Problem: Tissue Perfusion: Goal: Adequacy of tissue perfusion will improve Outcome: Progressing   Problem: Education: Goal: Knowledge of General Education information will improve Description: Including pain rating scale, medication(s)/side effects and non-pharmacologic comfort measures Outcome: Progressing   Problem: Health Behavior/Discharge Planning: Goal: Ability to manage health-related needs will improve Outcome: Progressing   Problem: Clinical Measurements: Goal: Ability to maintain clinical measurements within normal limits will improve Outcome: Progressing Goal: Will remain free from infection Outcome: Progressing Goal: Diagnostic test results will improve Outcome: Progressing Goal: Respiratory  complications will improve Outcome: Progressing Goal: Cardiovascular complication will be avoided Outcome: Progressing   Problem: Activity: Goal: Risk for activity intolerance will decrease Outcome: Progressing   Problem: Nutrition: Goal: Adequate nutrition will be maintained Outcome: Progressing   Problem: Coping: Goal: Level of anxiety will decrease Outcome: Progressing   Problem: Elimination: Goal: Will not experience complications related to bowel motility Outcome: Progressing Goal: Will not experience complications related to urinary retention Outcome: Progressing   Problem: Pain Managment: Goal: General experience of comfort will improve Outcome: Progressing   Problem: Safety: Goal: Ability to remain free from injury will improve Outcome: Progressing   Problem: Skin Integrity: Goal: Risk for impaired skin integrity will decrease Outcome: Progressing   Problem: Education: Goal: Ability to verbalize activity precautions or restrictions will improve Outcome: Progressing Goal: Knowledge of the prescribed therapeutic regimen will improve Outcome: Progressing Goal: Understanding of discharge needs will improve Outcome: Progressing   Problem: Activity: Goal: Ability to avoid complications of mobility impairment will improve Outcome: Progressing Goal: Ability to tolerate increased activity will improve Outcome: Progressing Goal: Will remain free from falls Outcome: Progressing   Problem: Bowel/Gastric: Goal: Gastrointestinal status for postoperative course will improve Outcome: Progressing   Problem: Clinical Measurements: Goal: Ability to maintain clinical measurements within normal limits will improve Outcome: Progressing Goal: Postoperative complications will be avoided or minimized Outcome: Progressing Goal: Diagnostic test results will improve Outcome: Progressing   Problem: Pain Management: Goal: Pain level will decrease Outcome: Progressing    Problem: Skin Integrity: Goal: Will show signs of wound healing Outcome: Progressing   Problem: Health Behavior/Discharge Planning: Goal: Identification of resources available to assist in meeting health care needs will improve Outcome: Progressing   Problem: Bladder/Genitourinary: Goal: Urinary functional status for postoperative course will improve Outcome: Progressing    Lidia Collum, RN

## 2023-04-10 NOTE — Progress Notes (Signed)
Overall stable.  Pain well-controlled.  Neurologic symptoms unchanged.  No significant dysphagia.  Drain output minimal.  Progressing reasonably well following anterior cervical decompression and fusion surgery.  Remove drain.  Continue efforts at mobilization.  Okay for inpatient rehabilitation from our standpoint.

## 2023-04-10 NOTE — Plan of Care (Signed)
  Problem: Education: Goal: Ability to describe self-care measures that may prevent or decrease complications (Diabetes Survival Skills Education) will improve Outcome: Progressing Goal: Individualized Educational Video(s) Outcome: Progressing   Problem: Coping: Goal: Ability to adjust to condition or change in health will improve Outcome: Progressing   Problem: Fluid Volume: Goal: Ability to maintain a balanced intake and output will improve Outcome: Progressing   Problem: Health Behavior/Discharge Planning: Goal: Ability to identify and utilize available resources and services will improve Outcome: Progressing Goal: Ability to manage health-related needs will improve Outcome: Progressing   Problem: Metabolic: Goal: Ability to maintain appropriate glucose levels will improve Outcome: Progressing   Problem: Nutritional: Goal: Maintenance of adequate nutrition will improve Outcome: Progressing Goal: Progress toward achieving an optimal weight will improve Outcome: Progressing   Problem: Skin Integrity: Goal: Risk for impaired skin integrity will decrease Outcome: Progressing   Problem: Tissue Perfusion: Goal: Adequacy of tissue perfusion will improve Outcome: Progressing   Problem: Education: Goal: Knowledge of General Education information will improve Description: Including pain rating scale, medication(s)/side effects and non-pharmacologic comfort measures Outcome: Progressing   Problem: Health Behavior/Discharge Planning: Goal: Ability to manage health-related needs will improve Outcome: Progressing   Problem: Clinical Measurements: Goal: Ability to maintain clinical measurements within normal limits will improve Outcome: Progressing Goal: Will remain free from infection Outcome: Progressing Goal: Diagnostic test results will improve Outcome: Progressing Goal: Respiratory complications will improve Outcome: Progressing Goal: Cardiovascular complication will  be avoided Outcome: Progressing   Problem: Activity: Goal: Risk for activity intolerance will decrease Outcome: Progressing   Problem: Nutrition: Goal: Adequate nutrition will be maintained Outcome: Progressing   Problem: Coping: Goal: Level of anxiety will decrease Outcome: Progressing   Problem: Elimination: Goal: Will not experience complications related to bowel motility Outcome: Progressing Goal: Will not experience complications related to urinary retention Outcome: Progressing   Problem: Pain Managment: Goal: General experience of comfort will improve Outcome: Progressing   Problem: Safety: Goal: Ability to remain free from injury will improve Outcome: Progressing   Problem: Skin Integrity: Goal: Risk for impaired skin integrity will decrease Outcome: Progressing   Problem: Education: Goal: Ability to verbalize activity precautions or restrictions will improve Outcome: Progressing Goal: Knowledge of the prescribed therapeutic regimen will improve Outcome: Progressing Goal: Understanding of discharge needs will improve Outcome: Progressing   Problem: Activity: Goal: Ability to avoid complications of mobility impairment will improve Outcome: Progressing Goal: Ability to tolerate increased activity will improve Outcome: Progressing Goal: Will remain free from falls Outcome: Progressing   Problem: Bowel/Gastric: Goal: Gastrointestinal status for postoperative course will improve Outcome: Progressing   Problem: Clinical Measurements: Goal: Ability to maintain clinical measurements within normal limits will improve Outcome: Progressing Goal: Postoperative complications will be avoided or minimized Outcome: Progressing Goal: Diagnostic test results will improve Outcome: Progressing   Problem: Pain Management: Goal: Pain level will decrease Outcome: Progressing   Problem: Skin Integrity: Goal: Will show signs of wound healing Outcome: Progressing    Problem: Health Behavior/Discharge Planning: Goal: Identification of resources available to assist in meeting health care needs will improve Outcome: Progressing   Problem: Bladder/Genitourinary: Goal: Urinary functional status for postoperative course will improve Outcome: Progressing   

## 2023-04-10 NOTE — Progress Notes (Signed)
Inpatient Rehab Admissions:  Inpatient Rehab Consult received.  I met with patient at the bedside for rehabilitation assessment and to discuss goals and expectations of an inpatient rehab admission.  Explained average length of stay, insurance authorization and discharge home after completion of CIR. Pt was not able to fully participate in conversation about CIR. Pt gave permission to contact mother. Spoke with pt's mother Annabelle Harman on the telephone. Informed her that currently CIR beds are extremely limited. She informed AC that currently pt's dispo is not finalized. She indicated that she could provide supervision and light hands-on assistance for pt. She also prefers pt receive therapy at a facility closer [place], [place] where she lives. TOC made aware  Signed: Wolfgang Phoenix, MS, CCC-SLP Admissions Coordinator [phone]

## 2023-04-11 ENCOUNTER — Other Ambulatory Visit (HOSPITAL_COMMUNITY): Payer: Self-pay | Admitting: Psychiatry

## 2023-04-11 DIAGNOSIS — E119 Type 2 diabetes mellitus without complications: Secondary | ICD-10-CM | POA: Diagnosis not present

## 2023-04-11 DIAGNOSIS — R410 Disorientation, unspecified: Secondary | ICD-10-CM | POA: Diagnosis not present

## 2023-04-11 DIAGNOSIS — R296 Repeated falls: Secondary | ICD-10-CM | POA: Diagnosis not present

## 2023-04-11 DIAGNOSIS — S12401A Unspecified nondisplaced fracture of fifth cervical vertebra, initial encounter for closed fracture: Secondary | ICD-10-CM | POA: Diagnosis not present

## 2023-04-11 DIAGNOSIS — F339 Major depressive disorder, recurrent, unspecified: Secondary | ICD-10-CM

## 2023-04-11 LAB — CBC
HCT: 41.9 % (ref 39.0–52.0)
Hemoglobin: 14 g/dL (ref 13.0–17.0)
MCH: 29.9 pg (ref 26.0–34.0)
MCHC: 33.4 g/dL (ref 30.0–36.0)
MCV: 89.5 fL (ref 80.0–100.0)
Platelets: 199 10*3/uL (ref 150–400)
RBC: 4.68 MIL/uL (ref 4.22–5.81)
RDW: 16.2 % — ABNORMAL HIGH (ref 11.5–15.5)
WBC: 11.6 10*3/uL — ABNORMAL HIGH (ref 4.0–10.5)
nRBC: 0 % (ref 0.0–0.2)

## 2023-04-11 LAB — BASIC METABOLIC PANEL
Anion gap: 9 (ref 5–15)
BUN: 17 mg/dL (ref 8–23)
CO2: 30 mmol/L (ref 22–32)
Calcium: 8.7 mg/dL — ABNORMAL LOW (ref 8.9–10.3)
Chloride: 94 mmol/L — ABNORMAL LOW (ref 98–111)
Creatinine, Ser: 0.78 mg/dL (ref 0.61–1.24)
GFR, Estimated: >60 mL/min (ref >60)
Glucose, Bld: 186 mg/dL — ABNORMAL HIGH (ref 70–99)
Potassium: 3.6 mmol/L (ref 3.5–5.1)
Sodium: 133 mmol/L — ABNORMAL LOW (ref 135–145)

## 2023-04-11 NOTE — Progress Notes (Signed)
Patient ID: [name], male   DOB: [birth year], 62 y.o.   MRN: [id number]    04/11/23 1700  Closed System Drain 1 Anterior Neck Accordion (Hemovac) 10 Fr.  Placement Date/Time: 04/08/23 1932   Person Inserting LDA: Dr. Joslyn Devon Number: 1  Orientation: Anterior  Location: Neck  Drain Tube Type: Accordion (Hemovac)  Size (Fr.): 10 Fr.  Site Description Unremarkable  Dressing Status Clean, Dry, Intact  Drainage Appearance None  Status  (pulled)  Intake (mL) 10 ml   Removed sutures and pulled Hemovac accordion drain per MD order. Replaced honeycomb dressing. Site clean dry and intact.  Lidia Collum, RN

## 2023-04-11 NOTE — Assessment & Plan Note (Signed)
Improved and stable. -Continue benztropine 0.5 mg daily at bedtime -Continue Depakote 1000 mg every 24 hours at bedtime -Continue phenelzine 30 mg twice daily

## 2023-04-11 NOTE — Progress Notes (Signed)
Patient educated on incentive spirometry, expresses understanding.  750 IS reached.  Will cont to monitor

## 2023-04-11 NOTE — Progress Notes (Addendum)
Daily Progress Note Intern Pager: [phone]  Patient name: [name] Medical record number: [id number] Date of birth: 05/26/1961 Age: 62 y.o. Gender: male  Primary Care Provider: Tiffany Kocher, DO Consultants: Neurosurgery  Code Status: Full code   Pt Overview and Major Events to Date:  9/28: Admitted for AMS.  C5 fracture found on imaging.  C-spine collar placed. 9/29: Psych consulted, decreased his home psych medications to reduce anticholinergic side effects. 9/30: Patient mentation improved.  Begun on heparin 5000 units every 8 hours. 10/1: Reversible causes of dementia (B12, RPR).  B12 negative. 10/3- Operation with neurosurgery: ACDF C5-C6 and overnight observation in neuro-ICU 10/4- Psychiatry signed off 10/5- FMTS resumed care on med-tele floor  Assessment and Plan:  [name] is a 62 year old male admitted s/p recurrent falls with C5 fracture, s/p ACDF C5-C6 with neurosurgery, doing well, pending disposition possibly to be admitted to inpatient rehab . Pertinent PMH/PSH includes HLD, hypothyroidism, a fib, T2DM, schizoaffective disorder, depression, seizures, spinal stenosis.  Assessment & Plan C5 cervical fracture (HCC) Stable. POD # 3 ACDF (possibly some concern for esophageal injury intraoperatively).  Still has drain with minimal output. -Per neurosurgery, [place] J collar when out of bed -Hemovac to be discontinued today  -Pain regimen: Tylenol 650 mg every 6 hours DC Oxycodone 5 mg patient has not required   -Use incentive spirometry every hour -Vital signs with neurochecks every 4 hours -PT/OT Disorientation Improved and stable. -Continue benztropine 0.5 mg daily at bedtime -Continue Depakote 1000 mg every 24 hours at bedtime -Continue phenelzine 30 mg twice daily Multiple falls Pending disposition. -Fall precautions Persistent atrial fibrillation (HCC) Rates controlled. -Eliquis currently being held, will resume on 10/8 per  neurosurgery. Tremor Bilateral upper and lower extremity, poor gait balance. Neurology consulted and recommended outpatient follow up.    Chronic and Stable Problems:  Hypothyroidism-Synthroid 125 mcg daily Schizoaffective disorder-perphenazine 24 mg nightly, phenelzine 45 mg twice daily, Cogentin 1 mg daily History of seizures-Depakote 500 mg twice daily HLD-Lipitor 40 mg daily Atrial fibrillation-Eliquis 5 mg twice daily T2DM: A1c 6.4, stable    FEN/GI: regular diet  PPx: SCDs, resume Eliquis 10/8 Dispo:CIR  pending insurance auth and family approval . Barriers include coordinating dispo.   Subjective:  Resting comfortably, no acute complaints.   Objective: Temp:  [97.4 F (36.3 C)-98 F (36.7 C)] 97.6 F (36.4 C) (10/06 0434) Pulse Rate:  [79-84] 79 (10/06 0434) Resp:  [17-18] 18 (10/06 0434) BP: (119-147)/(79-96) 137/93 (10/06 0434) SpO2:  [93 %-99 %] 93 % (10/06 0434) Physical Exam: General: chronically ill appearing, c-collar in place, no acute distress  Cardiovascular: RRR, no murmurs on exam  Respiratory: snoring while sleeping, lungs clear to auscultation  Abdomen: soft, non-tender, non distended  Extremities: no peripheral edema   Laboratory: Most recent CBC Lab Results  Component Value Date   WBC 8.8 04/07/2023   HGB 13.4 04/07/2023   HCT 38.9 (L) 04/07/2023   MCV 88.8 04/07/2023   PLT 142 (L) 04/07/2023   Most recent BMP    Latest Ref Rng & Units 04/05/2023    7:50 AM  BMP  Glucose 70 - 99 mg/dL 147   BUN 8 - 23 mg/dL 8   Creatinine 8.29 - 5.62 mg/dL 1.30   Sodium 865 - 784 mmol/L 136   Potassium 3.5 - 5.1 mmol/L 3.6   Chloride 98 - 111 mmol/L 98   CO2 22 - 32 mmol/L 26   Calcium 8.9 - 10.3 mg/dL 8.6  Imaging/Diagnostic Tests: No new labs or imaging.   Glendale Chard, DO 04/11/2023, 5:39 AM  PGY-2, [facility] FPTS Intern pager: [phone], text pages welcome Secure chat group [facility] Teaching  Service   I have personally seen and examined this patient and reviewed their chart. I have discussed this patient with the resident. I agree with the assessment and plan as outlined.   Medically stable for d/c to CIR once bed available.   Ellwood Dense, DO Family Medicine Teaching Service

## 2023-04-11 NOTE — Assessment & Plan Note (Signed)
Last A1c 6.4.  Home regimen includes Ozempic 1 mg weekly. -Hold Ozempic and patient -CBG checks with meals and before bed -Moderate sliding scale insulin

## 2023-04-11 NOTE — Assessment & Plan Note (Signed)
Rates controlled. -Eliquis currently being held, will resume on 10/8 per neurosurgery.

## 2023-04-11 NOTE — Assessment & Plan Note (Signed)
Stable. POD # 3 ACDF (possibly some concern for esophageal injury intraoperatively).  Still has drain with minimal output. -Per neurosurgery, [place] J collar when out of bed -Hemovac to be discontinued today  -Pain regimen: Tylenol 650 mg every 6 hours DC Oxycodone 5 mg patient has not required   -Use incentive spirometry every hour -Vital signs with neurochecks every 4 hours -PT/OT

## 2023-04-11 NOTE — TOC Progression Note (Signed)
Transition of Care [facility]) - Progression Note    Patient Details  Name: [name] MRN: [id number] Date of Birth: May 31, 1961  Transition of Care [facility]) CM/SW Contact  Anthony Sabal, RN Phone Number: 04/11/2023, 11:59 AM  Clinical Narrative:     LVM with patient's mother [name] requesting callback.  Will discuss alternatives to [facility] if call returned.   Patient's mother is from [place] this is [place] of [place].   [facility] has an [facility] [id number].  [facility] [phone] [facility] [phone] [facility] [id number] [facility]  [phone]  May be some options to consider that are near her.     [name] (Mother) [phone]     Expected Discharge Plan:  (to be determined) Barriers to Discharge: Continued Medical Work up  Expected Discharge Plan and Services   Discharge Planning Services: CM Consult   Living arrangements for the past 2 months: Apartment                                       Social Determinants of Health (SDOH) Interventions SDOH Screenings   Food Insecurity: No Food Insecurity (04/04/2023)  Housing: Low Risk  (04/04/2023)  Transportation Needs: No Transportation Needs (04/04/2023)  Utilities: Not At Risk (04/04/2023)  Alcohol Screen: Low Risk  (02/13/2023)  Depression (PHQ2-9): Low Risk  (02/19/2023)  Recent Concern: Depression (PHQ2-9) - Medium Risk (12/21/2022)  Financial Resource Strain: Low Risk  (02/13/2023)  Physical Activity: Inactive (02/13/2023)  Social Connections: Socially Isolated (02/13/2023)  Stress: No Stress Concern Present (02/13/2023)  Tobacco Use: Low Risk  (04/08/2023)  Health Literacy: Adequate Health Literacy (02/13/2023)    Readmission Risk Interventions     No data to display

## 2023-04-11 NOTE — Plan of Care (Signed)
Patient ID: ALFORD GAMERO, male   DOB: [birth year], 62 y.o.   MRN: [id number]  Problem: Education: Goal: Ability to describe self-care measures that may prevent or decrease complications (Diabetes Survival Skills Education) will improve Outcome: Progressing Goal: Individualized Educational Video(s) Outcome: Progressing   Problem: Coping: Goal: Ability to adjust to condition or change in health will improve Outcome: Progressing   Problem: Fluid Volume: Goal: Ability to maintain a balanced intake and output will improve Outcome: Progressing   Problem: Health Behavior/Discharge Planning: Goal: Ability to identify and utilize available resources and services will improve Outcome: Progressing Goal: Ability to manage health-related needs will improve Outcome: Progressing   Problem: Metabolic: Goal: Ability to maintain appropriate glucose levels will improve Outcome: Progressing   Problem: Nutritional: Goal: Maintenance of adequate nutrition will improve Outcome: Progressing Goal: Progress toward achieving an optimal weight will improve Outcome: Progressing   Problem: Skin Integrity: Goal: Risk for impaired skin integrity will decrease Outcome: Progressing   Problem: Tissue Perfusion: Goal: Adequacy of tissue perfusion will improve Outcome: Progressing   Problem: Education: Goal: Knowledge of General Education information will improve Description: Including pain rating scale, medication(s)/side effects and non-pharmacologic comfort measures Outcome: Progressing   Problem: Health Behavior/Discharge Planning: Goal: Ability to manage health-related needs will improve Outcome: Progressing   Problem: Clinical Measurements: Goal: Ability to maintain clinical measurements within normal limits will improve Outcome: Progressing Goal: Will remain free from infection Outcome: Progressing Goal: Diagnostic test results will improve Outcome: Progressing Goal: Respiratory  complications will improve Outcome: Progressing Goal: Cardiovascular complication will be avoided Outcome: Progressing   Problem: Activity: Goal: Risk for activity intolerance will decrease Outcome: Progressing   Problem: Nutrition: Goal: Adequate nutrition will be maintained Outcome: Progressing   Problem: Coping: Goal: Level of anxiety will decrease Outcome: Progressing   Problem: Elimination: Goal: Will not experience complications related to bowel motility Outcome: Progressing Goal: Will not experience complications related to urinary retention Outcome: Progressing   Problem: Pain Managment: Goal: General experience of comfort will improve Outcome: Progressing   Problem: Safety: Goal: Ability to remain free from injury will improve Outcome: Progressing   Problem: Skin Integrity: Goal: Risk for impaired skin integrity will decrease Outcome: Progressing   Problem: Education: Goal: Ability to verbalize activity precautions or restrictions will improve Outcome: Progressing Goal: Knowledge of the prescribed therapeutic regimen will improve Outcome: Progressing Goal: Understanding of discharge needs will improve Outcome: Progressing   Problem: Activity: Goal: Ability to avoid complications of mobility impairment will improve Outcome: Progressing Goal: Ability to tolerate increased activity will improve Outcome: Progressing Goal: Will remain free from falls Outcome: Progressing   Problem: Bowel/Gastric: Goal: Gastrointestinal status for postoperative course will improve Outcome: Progressing   Problem: Clinical Measurements: Goal: Ability to maintain clinical measurements within normal limits will improve Outcome: Progressing Goal: Postoperative complications will be avoided or minimized Outcome: Progressing Goal: Diagnostic test results will improve Outcome: Progressing   Problem: Pain Management: Goal: Pain level will decrease Outcome: Progressing    Problem: Skin Integrity: Goal: Will show signs of wound healing Outcome: Progressing   Problem: Health Behavior/Discharge Planning: Goal: Identification of resources available to assist in meeting health care needs will improve Outcome: Progressing   Problem: Bladder/Genitourinary: Goal: Urinary functional status for postoperative course will improve Outcome: Progressing    Lidia Collum, RN

## 2023-04-11 NOTE — Assessment & Plan Note (Signed)
Pending disposition. -Fall precautions

## 2023-04-11 NOTE — Assessment & Plan Note (Signed)
Bilateral upper and lower extremity, poor gait balance. Neurology consulted and recommended outpatient follow up.

## 2023-04-11 NOTE — Plan of Care (Signed)
Patient ID: [name], male   DOB: [birth year], 62 y.o.   MRN: [id number]  Problem: Education: Goal: Ability to describe self-care measures that may prevent or decrease complications (Diabetes Survival Skills Education) will improve 04/11/2023 1946 by Lidia Collum, RN Outcome: Progressing 04/11/2023 1945 by Lidia Collum, RN Outcome: Progressing Goal: Individualized Educational Video(s) 04/11/2023 1946 by Lidia Collum, RN Outcome: Progressing 04/11/2023 1945 by Lidia Collum, RN Outcome: Progressing   Problem: Coping: Goal: Ability to adjust to condition or change in health will improve 04/11/2023 1946 by Lidia Collum, RN Outcome: Progressing 04/11/2023 1945 by Lidia Collum, RN Outcome: Progressing   Problem: Fluid Volume: Goal: Ability to maintain a balanced intake and output will improve 04/11/2023 1946 by Lidia Collum, RN Outcome: Progressing 04/11/2023 1945 by Lidia Collum, RN Outcome: Progressing   Problem: Health Behavior/Discharge Planning: Goal: Ability to identify and utilize available resources and services will improve 04/11/2023 1946 by Lidia Collum, RN Outcome: Progressing 04/11/2023 1945 by Lidia Collum, RN Outcome: Progressing Goal: Ability to manage health-related needs will improve 04/11/2023 1946 by Lidia Collum, RN Outcome: Progressing 04/11/2023 1945 by Pia Mau D, RN Outcome: Progressing   Problem: Metabolic: Goal: Ability to maintain appropriate glucose levels will improve 04/11/2023 1946 by Lidia Collum, RN Outcome: Progressing 04/11/2023 1945 by Pia Mau D, RN Outcome: Progressing   Problem: Nutritional: Goal: Maintenance of adequate nutrition will improve 04/11/2023 1946 by Lidia Collum, RN Outcome: Progressing 04/11/2023 1945 by Pia Mau D, RN Outcome: Progressing Goal: Progress toward achieving an optimal weight will improve 04/11/2023 1946 by Lidia Collum,  RN Outcome: Progressing 04/11/2023 1945 by Pia Mau D, RN Outcome: Progressing   Problem: Skin Integrity: Goal: Risk for impaired skin integrity will decrease 04/11/2023 1946 by Lidia Collum, RN Outcome: Progressing 04/11/2023 1945 by Pia Mau D, RN Outcome: Progressing   Problem: Tissue Perfusion: Goal: Adequacy of tissue perfusion will improve 04/11/2023 1946 by Lidia Collum, RN Outcome: Progressing 04/11/2023 1945 by Lidia Collum, RN Outcome: Progressing   Problem: Education: Goal: Knowledge of General Education information will improve Description: Including pain rating scale, medication(s)/side effects and non-pharmacologic comfort measures 04/11/2023 1946 by Lidia Collum, RN Outcome: Progressing 04/11/2023 1945 by Lidia Collum, RN Outcome: Progressing   Problem: Health Behavior/Discharge Planning: Goal: Ability to manage health-related needs will improve 04/11/2023 1946 by Lidia Collum, RN Outcome: Progressing 04/11/2023 1945 by Lidia Collum, RN Outcome: Progressing   Problem: Clinical Measurements: Goal: Ability to maintain clinical measurements within normal limits will improve 04/11/2023 1946 by Lidia Collum, RN Outcome: Progressing 04/11/2023 1945 by Pia Mau D, RN Outcome: Progressing Goal: Will remain free from infection 04/11/2023 1946 by Lidia Collum, RN Outcome: Progressing 04/11/2023 1945 by Pia Mau D, RN Outcome: Progressing Goal: Diagnostic test results will improve 04/11/2023 1946 by Lidia Collum, RN Outcome: Progressing 04/11/2023 1945 by Pia Mau D, RN Outcome: Progressing Goal: Respiratory complications will improve 04/11/2023 1946 by Lidia Collum, RN Outcome: Progressing 04/11/2023 1945 by Pia Mau D, RN Outcome: Progressing Goal: Cardiovascular complication will be avoided 04/11/2023 1946 by Lidia Collum, RN Outcome: Progressing 04/11/2023 1945 by  Lidia Collum, RN Outcome: Progressing   Problem: Activity: Goal: Risk for activity intolerance will decrease 04/11/2023 1946 by Lidia Collum, RN Outcome: Progressing 04/11/2023 1945 by Pia Mau D, RN Outcome: Progressing   Problem: Nutrition: Goal: Adequate nutrition will be maintained 04/11/2023 1946  by Lidia Collum, RN Outcome: Progressing 04/11/2023 1945 by Pia Mau D, RN Outcome: Progressing   Problem: Coping: Goal: Level of anxiety will decrease 04/11/2023 1946 by Lidia Collum, RN Outcome: Progressing 04/11/2023 1945 by Lidia Collum, RN Outcome: Progressing   Problem: Elimination: Goal: Will not experience complications related to bowel motility 04/11/2023 1946 by Lidia Collum, RN Outcome: Progressing 04/11/2023 1945 by Lidia Collum, RN Outcome: Progressing Goal: Will not experience complications related to urinary retention 04/11/2023 1946 by Lidia Collum, RN Outcome: Progressing 04/11/2023 1945 by Lidia Collum, RN Outcome: Progressing   Problem: Pain Managment: Goal: General experience of comfort will improve 04/11/2023 1946 by Lidia Collum, RN Outcome: Progressing 04/11/2023 1945 by Pia Mau D, RN Outcome: Progressing   Problem: Safety: Goal: Ability to remain free from injury will improve 04/11/2023 1946 by Lidia Collum, RN Outcome: Progressing 04/11/2023 1945 by Pia Mau D, RN Outcome: Progressing   Problem: Skin Integrity: Goal: Risk for impaired skin integrity will decrease 04/11/2023 1946 by Lidia Collum, RN Outcome: Progressing 04/11/2023 1945 by Lidia Collum, RN Outcome: Progressing   Problem: Education: Goal: Ability to verbalize activity precautions or restrictions will improve 04/11/2023 1946 by Lidia Collum, RN Outcome: Progressing 04/11/2023 1945 by Pia Mau D, RN Outcome: Progressing Goal: Knowledge of the prescribed therapeutic regimen will  improve 04/11/2023 1946 by Lidia Collum, RN Outcome: Progressing 04/11/2023 1945 by Pia Mau D, RN Outcome: Progressing Goal: Understanding of discharge needs will improve 04/11/2023 1946 by Lidia Collum, RN Outcome: Progressing 04/11/2023 1945 by Lidia Collum, RN Outcome: Progressing   Problem: Activity: Goal: Ability to avoid complications of mobility impairment will improve 04/11/2023 1946 by Lidia Collum, RN Outcome: Progressing 04/11/2023 1945 by Pia Mau D, RN Outcome: Progressing Goal: Ability to tolerate increased activity will improve 04/11/2023 1946 by Lidia Collum, RN Outcome: Progressing 04/11/2023 1945 by Pia Mau D, RN Outcome: Progressing Goal: Will remain free from falls 04/11/2023 1946 by Lidia Collum, RN Outcome: Progressing 04/11/2023 1945 by Lidia Collum, RN Outcome: Progressing   Problem: Bowel/Gastric: Goal: Gastrointestinal status for postoperative course will improve 04/11/2023 1946 by Lidia Collum, RN Outcome: Progressing 04/11/2023 1945 by Lidia Collum, RN Outcome: Progressing   Problem: Clinical Measurements: Goal: Ability to maintain clinical measurements within normal limits will improve 04/11/2023 1946 by Lidia Collum, RN Outcome: Progressing 04/11/2023 1945 by Lidia Collum, RN Outcome: Progressing Goal: Postoperative complications will be avoided or minimized 04/11/2023 1946 by Lidia Collum, RN Outcome: Progressing 04/11/2023 1945 by Pia Mau D, RN Outcome: Progressing Goal: Diagnostic test results will improve 04/11/2023 1946 by Lidia Collum, RN Outcome: Progressing 04/11/2023 1945 by Pia Mau D, RN Outcome: Progressing   Problem: Pain Management: Goal: Pain level will decrease 04/11/2023 1946 by Lidia Collum, RN Outcome: Progressing 04/11/2023 1945 by Lidia Collum, RN Outcome: Progressing   Problem: Skin Integrity: Goal: Will show signs  of wound healing 04/11/2023 1946 by Lidia Collum, RN Outcome: Progressing 04/11/2023 1945 by Lidia Collum, RN Outcome: Progressing   Problem: Health Behavior/Discharge Planning: Goal: Identification of resources available to assist in meeting health care needs will improve 04/11/2023 1946 by Lidia Collum, RN Outcome: Progressing 04/11/2023 1945 by Lidia Collum, RN Outcome: Progressing   Problem: Bladder/Genitourinary: Goal: Urinary functional status for postoperative course will improve 04/11/2023 1946 by Lidia Collum, RN Outcome: Progressing 04/11/2023 1945 by Lidia Collum,  RN Outcome: Progressing  Lidia Collum, RN

## 2023-04-11 NOTE — Progress Notes (Signed)
No new issues or problems.  Patient's neck pain well-controlled.  No new radicular pain numbness or weakness.  Continues to swallow well.  Mild dysphonia.  He is afebrile.  His vital signs are stable.  His drain output is low.  He is awake and alert.  He is oriented and appropriate.  Motor and sensory function of his extremities are stable.  His wound is clean and dry.  His neck is soft and flat.  Progressing well.  Will remove drain today.  Continue efforts at mobilization and therapies.

## 2023-04-12 DIAGNOSIS — E119 Type 2 diabetes mellitus without complications: Secondary | ICD-10-CM | POA: Diagnosis not present

## 2023-04-12 DIAGNOSIS — S12401A Unspecified nondisplaced fracture of fifth cervical vertebra, initial encounter for closed fracture: Secondary | ICD-10-CM | POA: Diagnosis not present

## 2023-04-12 DIAGNOSIS — R296 Repeated falls: Secondary | ICD-10-CM | POA: Diagnosis not present

## 2023-04-12 LAB — CBC
HCT: 41.4 % (ref 39.0–52.0)
Hemoglobin: 13.7 g/dL (ref 13.0–17.0)
MCH: 29.9 pg (ref 26.0–34.0)
MCHC: 33.1 g/dL (ref 30.0–36.0)
MCV: 90.4 fL (ref 80.0–100.0)
Platelets: 143 10*3/uL — ABNORMAL LOW (ref 150–400)
RBC: 4.58 MIL/uL (ref 4.22–5.81)
RDW: 16.2 % — ABNORMAL HIGH (ref 11.5–15.5)
WBC: 10.2 10*3/uL (ref 4.0–10.5)
nRBC: 0 % (ref 0.0–0.2)

## 2023-04-12 LAB — GLUCOSE, CAPILLARY
Glucose-Capillary: 121 mg/dL — ABNORMAL HIGH (ref 70–99)
Glucose-Capillary: 103 mg/dL — ABNORMAL HIGH (ref 70–99)

## 2023-04-12 LAB — BASIC METABOLIC PANEL
Anion gap: 12 (ref 5–15)
BUN: 16 mg/dL (ref 8–23)
CO2: 27 mmol/L (ref 22–32)
Calcium: 8.5 mg/dL — ABNORMAL LOW (ref 8.9–10.3)
Chloride: 97 mmol/L — ABNORMAL LOW (ref 98–111)
Creatinine, Ser: 0.65 mg/dL (ref 0.61–1.24)
GFR, Estimated: >60 mL/min (ref >60)
Glucose, Bld: 94 mg/dL (ref 70–99)
Potassium: 4 mmol/L (ref 3.5–5.1)
Sodium: 136 mmol/L (ref 135–145)

## 2023-04-12 NOTE — Progress Notes (Signed)
Occupational Therapy Treatment Patient Details Name: [name] MRN: [id number] DOB: September 10, 1960 Today's Date: 04/12/2023   History of present illness Pt is a 61 y.o. male presenting via [facility] on 9/28 after recurrent falls. MRI cervical spine revealed acute fx extending through a bridging osteophyte at C5-6,  with extension through the underlying superior endplate of C6.  Subtle widening of the C5-6 interspace. Overall, constellation of  findings are consistent with an acute hyperextension injury. Acute anterior longitudinal ligament injury. S/p ACDF C5-6 10/3. PMH significant for significant for seizures, hypothyroidism, A-fib anticoagulated with Eliquis, DM, MDD, Anxiety and distant dx of schizoaffective d/o and OCD   OT comments  Pt progressing towards goals this session, needing set up -mod A for ADLs, min -modA +2 for bed mobility and min A+2 for transfers with RW. Pt with decr awareness of cervical precautions, educated pt on precautions, needs incr cues for log roll technique to EOB. Pt able to stand at sink x10 min for multiple grooming tasks, mild instability but able to self correct. Pt presenting with impairments listed below, will follow acutely. Patient will benefit from intensive inpatient follow up therapy, >3 hours/day to maximize safety/ind with ADLs/functional mobility.       If plan is discharge home, recommend the following:  A little help with walking and/or transfers;A lot of help with bathing/dressing/bathroom;Assistance with cooking/housework;Direct supervision/assist for medications management;Direct supervision/assist for financial management;Assist for transportation;Supervision due to cognitive status;Help with stairs or ramp for entrance   Equipment Recommendations  BSC/3in1;Other (comment) (RW)    Recommendations for Other Services Rehab consult    Precautions / Restrictions Precautions Precautions: Fall;Cervical Precaution Booklet Issued: No Precaution  Comments: tremors Required Braces or Orthoses: Cervical Brace Cervical Brace: Hard collar;Other (comment) (May remove when in bed, apply/remove in sitting, may remove to shower, may NOT ambulate to bathroom without brace) Restrictions Weight Bearing Restrictions: No       Mobility Bed Mobility Overal bed mobility: Needs Assistance Bed Mobility: Rolling, Sidelying to Sit Rolling: +2 for safety/equipment, Min assist Sidelying to sit: Min assist, HOB elevated, +2 for safety/equipment, Mod assist       General bed mobility comments: cues for log roll technique    Transfers Overall transfer level: Needs assistance Equipment used: Rolling walker (2 wheels) Transfers: Sit to/from Stand Sit to Stand: Min assist, +2 physical assistance, +2 safety/equipment                 Balance Overall balance assessment: Needs assistance Sitting-balance support: Single extremity supported, Bilateral upper extremity supported, Feet supported Sitting balance-Leahy Scale: Fair     Standing balance support: Bilateral upper extremity supported, During functional activity, Reliant on assistive device for balance Standing balance-Leahy Scale: Poor Standing balance comment: Reliant on RW and external physical assistance                           ADL either performed or assessed with clinical judgement   ADL Overall ADL's : Needs assistance/impaired Eating/Feeding: Set up   Grooming: Brushing hair;Oral care;Minimal assistance Grooming Details (indicate cue type and reason): incr time & cues Upper Body Bathing: Minimal assistance   Lower Body Bathing: Moderate assistance   Upper Body Dressing : Minimal assistance Upper Body Dressing Details (indicate cue type and reason): donning gown Lower Body Dressing: Minimal assistance   Toilet Transfer: Minimal assistance;Ambulation;Regular Toilet;Rolling walker (2 wheels) Toilet Transfer Details (indicate cue type and reason): simulated  Functional mobility during ADLs: Minimal assistance;Rolling walker (2 wheels)      Extremity/Trunk Assessment Upper Extremity Assessment Upper Extremity Assessment: Right hand dominant RUE Deficits / Details: tremor RUE Coordination: decreased gross motor;decreased fine motor LUE Deficits / Details: tremor LUE Coordination: decreased gross motor;decreased fine motor   Lower Extremity Assessment Lower Extremity Assessment: Generalized weakness        Vision   Vision Assessment?: No apparent visual deficits   Perception Perception Perception: Not tested   Praxis Praxis Praxis: Not tested    Cognition Arousal: Alert Behavior During Therapy: WFL for tasks assessed/performed Overall Cognitive Status: No family/caregiver present to determine baseline cognitive functioning                                 General Comments: pt needing incr cues for initiation of BADL tasks, unable to recall date or month, no recall of back precautions        Exercises      Shoulder Instructions       General Comments VSS on RA    Pertinent Vitals/ Pain       Pain Assessment Pain Assessment: Faces Pain Score: 2  Faces Pain Scale: Hurts a little bit Pain Location: neck Pain Descriptors / Indicators: Operative site guarding, Sore Pain Intervention(s): Limited activity within patient's tolerance, Monitored during session, Repositioned  Home Living                                          Prior Functioning/Environment              Frequency  Min 1X/week        Progress Toward Goals  OT Goals(current goals can now be found in the care plan section)  Progress towards OT goals: Progressing toward goals  Acute Rehab OT Goals Patient Stated Goal: to get OOB OT Goal Formulation: With patient Time For Goal Achievement: 04/23/23 Potential to Achieve Goals: Good ADL Goals Pt Will Perform Grooming: with set-up;sitting Pt Will Perform  Upper Body Bathing: with set-up;sitting Pt Will Perform Lower Body Bathing: with set-up;sit to/from stand Pt Will Perform Upper Body Dressing: with set-up;sitting Pt Will Perform Lower Body Dressing: with mod assist;sit to/from stand Pt Will Transfer to Toilet: with min assist;ambulating;bedside commode Additional ADL Goal #1: pt will complete bed mobility CGA as precursor to adls. Additional ADL Goal #2: pt will demonstrate cervical precautions and verbalize no lifting 100% accuracy  Plan      Co-evaluation                 AM-PAC OT "6 Clicks" Daily Activity     Outcome Measure   Help from another person eating meals?: A Little Help from another person taking care of personal grooming?: A Little Help from another person toileting, which includes using toliet, bedpan, or urinal?: A Lot Help from another person bathing (including washing, rinsing, drying)?: A Lot Help from another person to put on and taking off regular upper body clothing?: A Little Help from another person to put on and taking off regular lower body clothing?: A Lot 6 Click Score: 15    End of Session Equipment Utilized During Treatment: Gait belt;Rolling walker (2 wheels);Cervical collar  OT Visit Diagnosis: Unsteadiness on feet (R26.81);Muscle weakness (generalized) (M62.81)   Activity Tolerance Patient tolerated treatment well  Patient Left in chair;with call bell/phone within reach;with chair alarm set   Nurse Communication Mobility status;Precautions        Time: 1610-9604 OT Time Calculation (min): 16 min  Charges: OT General Charges $OT Visit: 1 Visit OT Treatments $Self Care/Home Management : 8-22 mins  Carver Fila, OTD, OTR/L SecureChat Preferred Acute Rehab [phone]   Carver Fila Koonce 04/12/2023, 9:53 AM

## 2023-04-12 NOTE — Assessment & Plan Note (Addendum)
Awaiting CIR placement. Mother is actively looking for long-term housing after rehab, as he has had multiple falls and and likely cannot live by himself anymore. -PT/OT recs: CIR -Fall precautions

## 2023-04-12 NOTE — Assessment & Plan Note (Addendum)
Stable. POD # 4 ACDF (possibly some concern for esophageal injury intraoperatively).  Still has drain with minimal output.  Pulled Hemovac yesterday 10/6. -Per neurosurgery, [place] J collar when out of bed -Pain regimen: Tylenol 650 mg every 6 hours - Begin Eliquis Wednesday 10/9, per neurosurgery note -Use incentive spirometry every hour -Vital signs with neurochecks every 4 hours -Outpatient neurosurgery follow-up in 3 weeks

## 2023-04-12 NOTE — TOC Progression Note (Signed)
Transition of Care [facility]) - Progression Note    Patient Details  Name: [name] MRN: [id number] Date of Birth: [birth year]  Transition of Care [facility]) CM/SW Contact  Kermit Balo, RN Phone Number: 04/12/2023, 2:01 PM  Clinical Narrative:    CM talked with patients mother and she feels SNF rehab will be better as she will not be able to provide any physical assist after an IR stay. She asked for a facility closer to her: [facility], [place]. CM will make some calls and fax him out to facilities closer to her.  [facility] pending and needs to be reviewed. Information sent in. TOC following.   Expected Discharge Plan: IP Rehab Facility Barriers to Discharge: Continued Medical Work up  Expected Discharge Plan and Services   Discharge Planning Services: CM Consult   Living arrangements for the past 2 months: Apartment                                       Social Determinants of Health (SDOH) Interventions SDOH Screenings   Food Insecurity: No Food Insecurity (04/04/2023)  Housing: Low Risk  (04/04/2023)  Transportation Needs: No Transportation Needs (04/04/2023)  Utilities: Not At Risk (04/04/2023)  Alcohol Screen: Low Risk  (02/13/2023)  Depression (PHQ2-9): Low Risk  (02/19/2023)  Recent Concern: Depression (PHQ2-9) - Medium Risk (12/21/2022)  Financial Resource Strain: Low Risk  (02/13/2023)  Physical Activity: Inactive (02/13/2023)  Social Connections: Socially Isolated (02/13/2023)  Stress: No Stress Concern Present (02/13/2023)  Tobacco Use: Low Risk  (04/08/2023)  Health Literacy: Adequate Health Literacy (02/13/2023)    Readmission Risk Interventions     No data to display

## 2023-04-12 NOTE — [place] FL2 (Signed)
[place] MEDICAID FL2 LEVEL OF CARE FORM     IDENTIFICATION  Patient Name: [name] Birthdate: [birth year] Sex: male Admission Date (Current Location): 04/03/2023  [facility] and [place] Number:   Ardelle Balls)   Facility and Address:         Provider Number: [id number]  Attending Physician Name and Address:  Caro Laroche, DO  Relative Name and Phone Number:  Pruett,Dana Mother [phone]  [phone]    Current Level of Care: Hospital Recommended Level of Care: Skilled Nursing Facility Prior Approval Number:    Date Approved/Denied:   PASRR Number: pending  Discharge Plan: SNF    Current Diagnoses: Patient Active Problem List   Diagnosis Date Noted   Type 2 diabetes mellitus without complication, without long-term current use of insulin (HCC) 04/11/2023   Tremor 04/10/2023   Disorientation 04/06/2023   Multiple falls 04/04/2023   C5 cervical fracture (HCC) 04/03/2023   Belching 12/14/2022   DISH (diffuse idiopathic skeletal hyperostosis) 05/12/2022   Bilateral primary osteoarthritis of hip 08/13/2021   Statin intolerance 06/24/2021   Spinal stenosis of lumbar region 01/16/2020   Chronic lumbar radiculopathy 12/08/2019   Hyperlipidemia associated with type 2 diabetes mellitus (HCC) 12/08/2019   Vertigo 11/03/2019   Paronychia of left index finger 05/19/2019   Rosacea 10/24/2018   Loss of weight 09/09/2018   Schizoaffective disorder, depressive type (HCC) 04/08/2018   Varicose veins of bilateral lower extremities with other complications 02/03/2016   Erectile dysfunction 04/26/2015   Seizures (HCC)    Persistent atrial fibrillation (HCC)    BMI 34.0-34.9,adult    Acute bilateral low back pain without sciatica    Seborrheic dermatitis 02/10/2007    Orientation RESPIRATION BLADDER Height & Weight     Self, Situation, Place  Normal Continent Weight: 96.9 kg Height:  5' 2.5" (158.8 cm)  BEHAVIORAL SYMPTOMS/MOOD NEUROLOGICAL BOWEL NUTRITION  STATUS      Continent Diet (Regular diet with thin liquids)  AMBULATORY STATUS COMMUNICATION OF NEEDS Skin   Limited Assist Verbally Surgical wounds (neck with hard collar)                       Personal Care Assistance Level of Assistance  Bathing, Feeding, Dressing Bathing Assistance: Limited assistance Feeding assistance: Limited assistance Dressing Assistance: Limited assistance     Functional Limitations Info  Sight, Hearing, Speech Sight Info: Impaired Hearing Info: Adequate Speech Info: Adequate    SPECIAL CARE FACTORS FREQUENCY  PT (By licensed PT), OT (By licensed OT)     PT Frequency: 5x/wk OT Frequency: 5x/wk            Contractures Contractures Info: Not present    Additional Factors Info  Code Status, Allergies, Psychotropic Code Status Info: Full Allergies Info: Lamictal/ Metformin Psychotropic Info: Cogentin 0.5 mg at bedtime/ Depakote ER 1000mg  at bedtime/ Trilafon 16 mg at bedtime/ Nardil 30 mg BID         Current Medications (04/12/2023):  This is the current hospital active medication list Current Facility-Administered Medications  Medication Dose Route Frequency Provider Last Rate Last Admin   acetaminophen (TYLENOL) tablet 650 mg  650 mg Oral Q4H PRN Lisbeth Renshaw, MD       Or   acetaminophen (TYLENOL) suppository 650 mg  650 mg Rectal Q4H PRN Lisbeth Renshaw, MD       atorvastatin (LIPITOR) tablet 40 mg  40 mg Oral Daily Lisbeth Renshaw, MD   40 mg at 04/12/23 1035   benztropine (COGENTIN)  tablet 0.5 mg  0.5 mg Oral QHS Lisbeth Renshaw, MD   0.5 mg at 04/11/23 2115   calcium carbonate (TUMS - dosed in mg elemental calcium) chewable tablet 200 mg of elemental calcium  1 tablet Oral Daily PRN Lisbeth Renshaw, MD       Chlorhexidine Gluconate Cloth 2 % PADS 6 each  6 each Topical Daily Dawley, Troy C, DO   6 each at 04/12/23 1000   divalproex (DEPAKOTE ER) 24 hr tablet 1,000 mg  1,000 mg Oral QHS Lisbeth Renshaw, MD   1,000  mg at 04/11/23 2115   levothyroxine (SYNTHROID) tablet 125 mcg  125 mcg Oral Daily Lisbeth Renshaw, MD   125 mcg at 04/12/23 0607   lidocaine (LIDODERM) 5 % 1 patch  1 patch Transdermal Q24H Lisbeth Renshaw, MD   1 patch at 04/11/23 1254   menthol-cetylpyridinium (CEPACOL) lozenge 3 mg  1 lozenge Oral PRN Lisbeth Renshaw, MD       Or   phenol (CHLORASEPTIC) mouth spray 1 spray  1 spray Mouth/Throat PRN Lisbeth Renshaw, MD       Oral care mouth rinse  15 mL Mouth Rinse PRN Lisbeth Renshaw, MD       perphenazine (TRILAFON) tablet 16 mg  16 mg Oral QHS Lisbeth Renshaw, MD   16 mg at 04/11/23 2116   phenelzine (NARDIL) tablet 30 mg  30 mg Oral BID Lisbeth Renshaw, MD   30 mg at 04/12/23 0842   polyethylene glycol (MIRALAX / GLYCOLAX) packet 34 g  34 g Oral Daily Lisbeth Renshaw, MD   34 g at 04/12/23 1036   senna-docusate (Senokot-S) tablet 1 tablet  1 tablet Oral QHS Lisbeth Renshaw, MD   1 tablet at 04/11/23 2115   sodium chloride flush (NS) 0.9 % injection 3 mL  3 mL Intravenous Q12H Lisbeth Renshaw, MD   3 mL at 04/12/23 1030   sodium chloride flush (NS) 0.9 % injection 3 mL  3 mL Intravenous PRN Lisbeth Renshaw, MD         Discharge Medications: Please see discharge summary for a list of discharge medications.  Relevant Imaging Results:  Relevant Lab Results:   Additional Information SS#: [id number]  Kermit Balo, RN

## 2023-04-12 NOTE — Care Management Important Message (Signed)
Important Message  Patient Details  Name: [name] MRN: [id number] Date of Birth: [birth year]   Important Message Given:  Yes - Medicare IM     Dorena Bodo 04/12/2023, 2:26 PM

## 2023-04-12 NOTE — Assessment & Plan Note (Signed)
Improved and stable. -Continue benztropine 0.5 mg daily at bedtime -Continue Depakote 1000 mg every 24 hours at bedtime -Continue phenelzine 30 mg twice daily

## 2023-04-12 NOTE — Progress Notes (Signed)
Daily Progress Note Intern Pager: [phone]  Patient name: [name] Medical record number: [id number] Date of birth: 30-Aug-1960 Age: 62 y.o. Gender: male  Primary Care Provider: Tiffany Kocher, DO Consultants: Neurosurgery Code Status: Full  Pt Overview and Major Events to Date:   [name] is a 62 year old male with profound dementia (A&O to self, family) admitted s/p repeated falls at home and found to have a C5 fracture, status post stabilization with neurosurgery 10/3.  Now pending CIR placement.  9/28: Admitted for AMS.  C5 fracture found on imaging.  C-spine collar placed. 9/29: Psych consulted, decreased his home psych medications to reduce anticholinergic side effects. 9/30: Patient mentation improved.  Begun on heparin 5000 units every 8 hours. 10/1: Reversible causes of dementia (B12, RPR).  B12 negative. 10/3- Operation with neurosurgery: ACDF C5-C6 and overnight observation in neuro-ICU 10/4- Psychiatry signed off 10/5- FMTS resumed care on med-tele floor Assessment & Plan C5 cervical fracture (HCC) Stable. POD # 4 ACDF (possibly some concern for esophageal injury intraoperatively).  Still has drain with minimal output.  Pulled Hemovac yesterday 10/6. -Per neurosurgery, [place] J collar when out of bed -Pain regimen: Tylenol 650 mg every 6 hours - Begin Eliquis Wednesday 10/9, per neurosurgery note -Use incentive spirometry every hour -Vital signs with neurochecks every 4 hours -Outpatient neurosurgery follow-up in 3 weeks Multiple falls Awaiting CIR placement. Mother is actively looking for long-term housing after rehab, as he has had multiple falls and and likely cannot live by himself anymore. -PT/OT recs: CIR -Fall precautions Disorientation Improved and stable. -Continue benztropine 0.5 mg daily at bedtime -Continue Depakote 1000 mg every 24 hours at bedtime -Continue phenelzine 30 mg twice daily Persistent atrial fibrillation (HCC) Rates continue  to be controlled. -Eliquis currently being held, will resume on 10/9 per neurosurgery. Type 2 diabetes mellitus without complication, without long-term current use of insulin (HCC) Last A1c 6.4.  Home regimen includes Ozempic 1 mg weekly.  Discontinued sliding scale/CBGs after glucose of 90.   -Hold Ozempic.  -CBGs and MSSI discontinued to limit procedures. Patient BGs relatively well controlled. A1c within acceptable limits on home ozempic monotherapy, will continue on discharge.  Chronic and Stable Issues:  Tremor: Bilateral upper and lower extremity.  Neurology recommended outpatient follow-up. Leukocytosis: 11.4 as of 10/7.  Likely acute phase reactant ISO neurosurgery.  No signs of infection otherwise.  Can recheck CBC if concerns.  FEN/GI: Regular PPx: SCDs Dispo: CIR, mother is in touch with social work, is looking into long-term facility after rehab.  Subjective:   On interview, patient is undergoing PT eval/rehab.  Standing near the sink.  In good spirits.  Notes minimal pain in neck, otherwise no new complaints.   Objective:  BP: 144/99 HR: 79 RR: 19 T: Afebrile O2sat: 96% on room air  Significant vitals over past 24 hours:   Physical Exam:  General: Well-appearing elderly male, NAD, attentive to interview. Cardiovascular: Regular rate no m/r/g. Respiratory: CTAB. No w/r/r.  Abdomen: No tenderness in 4 quadrants. BS+.  Extremities: ROM intact.  Nonedematous. Neuro: Alert and oriented to self, situation, Clinical research associate.  Basic labs:  Most recent CBC Lab Results  Component Value Date   WBC 11.6 (H) 04/11/2023   HGB 14.0 04/11/2023   HCT 41.9 04/11/2023   MCV 89.5 04/11/2023   PLT 199 04/11/2023   Most recent BMP    Latest Ref Rng & Units 04/11/2023    9:31 AM  BMP  Glucose 70 - 99 mg/dL 956  BUN 8 - 23 mg/dL 17   Creatinine 4.09 - 1.24 mg/dL 8.11   Sodium 914 - 782 mmol/L 133   Potassium 3.5 - 5.1 mmol/L 3.6   Chloride 98 - 111 mmol/L 94   CO2 22 - 32 mmol/L 30    Calcium 8.9 - 10.3 mg/dL 8.7     Other pertinent labs:  Unremarkable.  Slight leukocytosis to 11.6 postsurgery.  Patient afebrile, likely acute phase reactant.  Imaging/Diagnostic Tests:  No new imaging.  Anthony China, MD 04/12/2023, 7:28 AM  PGY-1, [facility] FPTS Intern pager: [phone], text pages welcome Secure chat group [facility] Teaching Service

## 2023-04-12 NOTE — Progress Notes (Signed)
Physical Therapy Treatment  Patient Details Name: [name] MRN: [id number] DOB: 13-Nov-1960 Today's Date: 04/12/2023   History of Present Illness Pt is a 62 y.o. male presenting via PTAR on 9/28 after recurrent falls. MRI cervical spine revealed acute fx extending through a bridging osteophyte at C5-6,  with extension through the underlying superior endplate of C6.  Subtle widening of the C5-6 interspace. Overall, constellation of  findings are consistent with an acute hyperextension injury. Acute anterior longitudinal ligament injury. S/p ACDF C5-6 10/3. PMH significant for significant for seizures, hypothyroidism, A-fib anticoagulated with Eliquis, DM, MDD, Anxiety and distant dx of schizoaffective d/o and OCD    PT Comments  Pt progressing towards physical therapy goals. Pt able to demonstrate transfers and ambulation with light +2 assist but could be managed with +1 assist. Anticipate pt will continue to progress well however would still benefit from post-acute rehab to maximize functional independence, safety, and to decrease risk for falls.    If plan is discharge home, recommend the following: A lot of help with walking and/or transfers;A lot of help with bathing/dressing/bathroom;Assistance with cooking/housework;Direct supervision/assist for financial management;Direct supervision/assist for medications management;Assist for transportation;Help with stairs or ramp for entrance;Supervision due to cognitive status   Can travel by private vehicle        Equipment Recommendations  Rolling walker (2 wheels);BSC/3in1    Recommendations for Other Services Rehab consult     Precautions / Restrictions Precautions Precautions: Fall;Cervical Precaution Booklet Issued: No Precaution Comments: tremors Required Braces or Orthoses: Cervical Brace Cervical Brace: Hard collar;Other (comment) (May remove when in bed, apply/remove in sitting, may remove to shower, may NOT ambulate to bathroom  without brace) Restrictions Weight Bearing Restrictions: No     Mobility  Bed Mobility Overal bed mobility: Needs Assistance Bed Mobility: Rolling, Sidelying to Sit Rolling: Mod assist, +2 for safety/equipment Sidelying to sit: Min assist, HOB elevated, +2 for safety/equipment       General bed mobility comments: Multimodal cues for log roll technique. Pt was able to come to EOB with use of bed pad to facilitate full roll as well as +2 for guiding trunk through log roll and elevating to full sitting position.    Transfers Overall transfer level: Needs assistance Equipment used: Rolling walker (2 wheels) Transfers: Sit to/from Stand Sit to Stand: Min assist, +2 physical assistance, +2 safety/equipment           General transfer comment: VC's for hand placement on seated surface for safety. +2 min assist for power up to full stand.    Ambulation/Gait Ambulation/Gait assistance: Min assist, +2 safety/equipment Gait Distance (Feet): 100 Feet Assistive device: Rolling walker (2 wheels) Gait Pattern/deviations: Decreased step length - left, Decreased stride length, Decreased dorsiflexion - right, Decreased dorsiflexion - left, Knee flexed in stance - right, Knee flexed in stance - left, Shuffle, Narrow base of support, Decreased step length - right, Step-through pattern Gait velocity: Decreased Gait velocity interpretation: <1.31 ft/sec, indicative of household ambulator   General Gait Details: Slow gait speed with decreased step/stride length. Pt cued for longer strides but unable to make corrective changes. No knee buckling noted throughout gait training.   Stairs             Wheelchair Mobility     Tilt Bed    Modified Rankin (Stroke Patients Only)       Balance Overall balance assessment: Needs assistance Sitting-balance support: Single extremity supported, Bilateral upper extremity supported, Feet supported Sitting balance-Leahy Scale: Poor  Sitting  balance - Comments: UE support, CGA-minA for static sitting balance.   Standing balance support: Bilateral upper extremity supported, During functional activity, Reliant on assistive device for balance Standing balance-Leahy Scale: Poor Standing balance comment: Reliant on RW and external physical assistance                            Cognition Arousal: Alert Behavior During Therapy: WFL for tasks assessed/performed                                            Exercises      General Comments General comments (skin integrity, edema, etc.): VSS on RA,      Pertinent Vitals/Pain Pain Assessment Pain Assessment: Faces Faces Pain Scale: Hurts a little bit Pain Location: neck Pain Descriptors / Indicators: Operative site guarding, Sore Pain Intervention(s): Limited activity within patient's tolerance, Monitored during session, Repositioned    Home Living                          Prior Function            PT Goals (current goals can now be found in the care plan section) Acute Rehab PT Goals Patient Stated Goal: to improve PT Goal Formulation: With patient Time For Goal Achievement: 04/23/23 Potential to Achieve Goals: Good Progress towards PT goals: Progressing toward goals    Frequency    Min 1X/week      PT Plan      Co-evaluation              AM-PAC PT "6 Clicks" Mobility   Outcome Measure  Help needed turning from your back to your side while in a flat bed without using bedrails?: A Lot Help needed moving from lying on your back to sitting on the side of a flat bed without using bedrails?: A Lot Help needed moving to and from a bed to a chair (including a wheelchair)?: A Little Help needed standing up from a chair using your arms (e.g., wheelchair or bedside chair)?: A Lot Help needed to walk in hospital room?: A Little Help needed climbing 3-5 steps with a railing? : A Lot 6 Click Score: 14    End of Session  Equipment Utilized During Treatment: Gait belt;Cervical collar Activity Tolerance: Patient tolerated treatment well Patient left: in chair;with call bell/phone within reach;with chair alarm set Nurse Communication: Mobility status;Other (comment) (Central monitoring issue) PT Visit Diagnosis: Unsteadiness on feet (R26.81);Other abnormalities of gait and mobility (R26.89);Muscle weakness (generalized) (M62.81);History of falling (Z91.81);Repeated falls (R29.6);Difficulty in walking, not elsewhere classified (R26.2)     Time: 1610-9604 PT Time Calculation (min) (ACUTE ONLY): 16 min  Charges:    $Gait Training: 8-22 mins PT General Charges $$ ACUTE PT VISIT: 1 Visit                     [name], PT, DPT Acute Rehabilitation Services Secure Chat Preferred Office: [phone]    Marylynn Pearson 04/12/2023, 9:46 AM

## 2023-04-12 NOTE — Progress Notes (Signed)
  NEUROSURGERY PROGRESS NOTE   No issues overnight. Pt without significant neck pain. Tolerating diet.  EXAM:  BP 123/81 (BP Location: Right Arm)   Pulse 81   Temp 98 F (36.7 C) (Oral)   Resp 17   Ht 5' 2.5" (1.588 m)   Wt 96.9 kg   SpO2 93%   BMI 38.45 kg/m   Awake, alert, oriented  Speech fluent, appropriate  CN grossly intact  5/5 BUE/BLE  Wound c/d/I  IMPRESSION:  62 y.o. male POD# 4 C5-6 ACDF, doing well  PLAN: - Cont supportive care per primary service - F/u in outpatient NS clinic in 3 weeks - Maintain [place]-J collar when OOB - Can restart eliquis on 10/9 (Wed)   Lisbeth Renshaw, MD [facility]

## 2023-04-12 NOTE — Assessment & Plan Note (Addendum)
Rates continue to be controlled. - Eliquis currently being held, will resume on 10/9 per neurosurgery.

## 2023-04-12 NOTE — Plan of Care (Signed)
  Problem: Coping: Goal: Ability to adjust to condition or change in health will improve Outcome: Progressing   Problem: Nutritional: Goal: Maintenance of adequate nutrition will improve Outcome: Progressing   Problem: Skin Integrity: Goal: Risk for impaired skin integrity will decrease Outcome: Progressing   Problem: Education: Goal: Knowledge of General Education information will improve Description: Including pain rating scale, medication(s)/side effects and non-pharmacologic comfort measures Outcome: Progressing

## 2023-04-12 NOTE — Progress Notes (Signed)
Re: [name] DOB:June 28, 1961 Date:04/12/2023   To Whom It May Concern:  Please be advised that the above-named patient will require a short-term nursing home stay--anticipated 30 days or less for rehabilitation and strengthening. The plan is for home.

## 2023-04-12 NOTE — Assessment & Plan Note (Addendum)
Last A1c 6.4.  Home regimen includes Ozempic 1 mg weekly.  Discontinued sliding scale/CBGs after glucose of 90.   -Hold Ozempic.  -CBGs and MSSI discontinued to limit procedures. Patient BGs relatively well controlled. A1c within acceptable limits on home ozempic monotherapy, will continue on discharge.

## 2023-04-13 ENCOUNTER — Other Ambulatory Visit: Payer: Self-pay | Admitting: Student

## 2023-04-13 ENCOUNTER — Encounter (HOSPITAL_COMMUNITY): Payer: Self-pay | Admitting: Neurosurgery

## 2023-04-13 DIAGNOSIS — R296 Repeated falls: Secondary | ICD-10-CM | POA: Diagnosis not present

## 2023-04-13 DIAGNOSIS — S12401A Unspecified nondisplaced fracture of fifth cervical vertebra, initial encounter for closed fracture: Secondary | ICD-10-CM | POA: Diagnosis not present

## 2023-04-13 DIAGNOSIS — E119 Type 2 diabetes mellitus without complications: Secondary | ICD-10-CM | POA: Diagnosis not present

## 2023-04-13 LAB — GLUCOSE, CAPILLARY: Glucose-Capillary: 104 mg/dL — ABNORMAL HIGH (ref 70–99)

## 2023-04-13 NOTE — Assessment & Plan Note (Addendum)
Last A1c 6.4.  Home regimen includes Ozempic 1 mg weekly.  Discontinued sliding scale/CBGs.  BGs over the last 24 hours: 104-121. - Hold Ozempic.  Will call mother this afternoon to discuss timing of last dosing of Ozempic.  Is on 1 mg/week dose at home.  Would be preferable not to have to restart Ozempic dosing from the beginning. - CBGs and MSSI discontinued to limit procedures. Patient BGs relatively well controlled. A1c within acceptable limits on home ozempic monotherapy, will continue on discharge.

## 2023-04-13 NOTE — Assessment & Plan Note (Addendum)
Awaiting CIR placement. Mother is actively looking for long-term housing after rehab, as he has had multiple falls and and likely cannot live by himself anymore.  Per PT/OT notes, patient is progressing towards goals while in the hospital. - PT/OT recs: CIR - Fall precautions

## 2023-04-13 NOTE — Plan of Care (Signed)
  Problem: Education: Goal: Ability to describe self-care measures that may prevent or decrease complications (Diabetes Survival Skills Education) will improve Outcome: Progressing Goal: Individualized Educational Video(s) Outcome: Progressing   Problem: Coping: Goal: Ability to adjust to condition or change in health will improve Outcome: Progressing   Problem: Fluid Volume: Goal: Ability to maintain a balanced intake and output will improve Outcome: Progressing   Problem: Health Behavior/Discharge Planning: Goal: Ability to identify and utilize available resources and services will improve Outcome: Progressing Goal: Ability to manage health-related needs will improve Outcome: Progressing   Problem: Metabolic: Goal: Ability to maintain appropriate glucose levels will improve Outcome: Progressing   Problem: Nutritional: Goal: Maintenance of adequate nutrition will improve Outcome: Progressing Goal: Progress toward achieving an optimal weight will improve Outcome: Progressing   Problem: Skin Integrity: Goal: Risk for impaired skin integrity will decrease Outcome: Progressing   Problem: Tissue Perfusion: Goal: Adequacy of tissue perfusion will improve Outcome: Progressing   Problem: Education: Goal: Knowledge of General Education information will improve Description: Including pain rating scale, medication(s)/side effects and non-pharmacologic comfort measures Outcome: Progressing   Problem: Health Behavior/Discharge Planning: Goal: Ability to manage health-related needs will improve Outcome: Progressing   Problem: Clinical Measurements: Goal: Ability to maintain clinical measurements within normal limits will improve Outcome: Progressing Goal: Will remain free from infection Outcome: Progressing Goal: Diagnostic test results will improve Outcome: Progressing Goal: Respiratory complications will improve Outcome: Progressing Goal: Cardiovascular complication will  be avoided Outcome: Progressing   Problem: Activity: Goal: Risk for activity intolerance will decrease Outcome: Progressing   Problem: Nutrition: Goal: Adequate nutrition will be maintained Outcome: Progressing   Problem: Coping: Goal: Level of anxiety will decrease Outcome: Progressing   Problem: Elimination: Goal: Will not experience complications related to bowel motility Outcome: Progressing Goal: Will not experience complications related to urinary retention Outcome: Progressing   Problem: Pain Managment: Goal: General experience of comfort will improve Outcome: Progressing   Problem: Safety: Goal: Ability to remain free from injury will improve Outcome: Progressing   Problem: Skin Integrity: Goal: Risk for impaired skin integrity will decrease Outcome: Progressing   Problem: Education: Goal: Ability to verbalize activity precautions or restrictions will improve Outcome: Progressing Goal: Knowledge of the prescribed therapeutic regimen will improve Outcome: Progressing Goal: Understanding of discharge needs will improve Outcome: Progressing   Problem: Activity: Goal: Ability to avoid complications of mobility impairment will improve Outcome: Progressing Goal: Ability to tolerate increased activity will improve Outcome: Progressing Goal: Will remain free from falls Outcome: Progressing   Problem: Bowel/Gastric: Goal: Gastrointestinal status for postoperative course will improve Outcome: Progressing   Problem: Clinical Measurements: Goal: Ability to maintain clinical measurements within normal limits will improve Outcome: Progressing Goal: Postoperative complications will be avoided or minimized Outcome: Progressing Goal: Diagnostic test results will improve Outcome: Progressing   Problem: Pain Management: Goal: Pain level will decrease Outcome: Progressing   Problem: Skin Integrity: Goal: Will show signs of wound healing Outcome: Progressing    Problem: Health Behavior/Discharge Planning: Goal: Identification of resources available to assist in meeting health care needs will improve Outcome: Progressing   Problem: Bladder/Genitourinary: Goal: Urinary functional status for postoperative course will improve Outcome: Progressing   

## 2023-04-13 NOTE — Assessment & Plan Note (Addendum)
Rates continue to be controlled. - Eliquis currently being held, will resume on 10/9 per neurosurgery.

## 2023-04-13 NOTE — Assessment & Plan Note (Addendum)
Patient oriented to self and hospital, but not city, year.  This appears to be his baseline.  A&O - Continue benztropine 0.5 mg daily at bedtime - Continue Depakote 1000 mg every 24 hours at bedtime - Continue phenelzine 30 mg twice daily

## 2023-04-13 NOTE — Assessment & Plan Note (Addendum)
Stable. POD # 5 ACDF (possibly some concern for esophageal injury intraoperatively).  Pulled Hemovac yesterday 10/6.  [place] J collar still in place.  Pain well-controlled. - Per neurosurgery, [place] J collar when out of bed.   - Pain regimen: Tylenol 650 mg every 6 hours - Begin Eliquis Wednesday 10/9, per neurosurgery note - Use incentive spirometry every hour - Vital signs with neurochecks every 4 hours - Outpatient neurosurgery follow-up in 3 weeks

## 2023-04-13 NOTE — Assessment & Plan Note (Signed)
Bilateral upper and lower extremity, poor gait balance. Neurology consulted and recommended outpatient follow up.

## 2023-04-13 NOTE — Plan of Care (Addendum)
Collateral call: [name], mother [phone]  Called to ask after patient's last known scheduled dose of Ozempic to determine further administration.  While patient is a poor historian it appears that he likely took his last dose of 1 mg Ozempic on 24 September.  He is therefore eligible for continuing his Ozempic as scheduled with only 1 week missed.   Per mom, patient's car was found.  She and patient's brother have decided to take away his driver's license as he lost his car and does not remember the events that led up to his admission.  Has a neurology appointment scheduled for December.  Expressed that this would be a good time to determine further driving privileges.  Updated mother on Hospital course, patient appears to be doing well, no new complaints.  Will likely go to an acute inpatient rehab followed by SNF for some time.  Answered all questions.

## 2023-04-13 NOTE — Progress Notes (Signed)
Daily Progress Note Intern Pager: [phone]  Patient name: [name] Medical record number: [id number] Date of birth: Jun 11, 1961 Age: 62 y.o. Gender: male  Primary Care Provider: Tiffany Kocher, DO Consultants: Neurosurgery Code Status: Full  Pt Overview and Major Events to Date:   Amen [name] is a 62 year old male with profound dementia (A&O to self, family) admitted s/p repeated falls at home and found to have a C5 fracture, status post stabilization with neurosurgery 10/3.  Now pending CIR placement.   9/28: Admitted for AMS.  C5 fracture found on imaging.  C-spine collar placed. 9/29: Psych consulted, decreased his home psych medications to reduce anticholinergic side effects. 9/30: Patient mentation improved.  Begun on heparin 5000 units every 8 hours. 10/1: Reversible causes of dementia (B12, RPR).  B12 negative. 10/3- Operation with neurosurgery: ACDF C5-C6 and overnight observation in neuro-ICU 10/4- Psychiatry signed off 10/5- FMTS resumed care on med-tele floor Assessment & Plan C5 cervical fracture (HCC) Stable. POD # 5 ACDF (possibly some concern for esophageal injury intraoperatively).  Pulled Hemovac yesterday 10/6.  [place] J collar still in place.  Pain well-controlled. - Per neurosurgery, [place] J collar when out of bed.   - Pain regimen: Tylenol 650 mg every 6 hours - Begin Eliquis Wednesday 10/9, per neurosurgery note - Use incentive spirometry every hour - Vital signs with neurochecks every 4 hours - Outpatient neurosurgery follow-up in 3 weeks Multiple falls Awaiting CIR placement. Mother is actively looking for long-term housing after rehab, as he has had multiple falls and and likely cannot live by himself anymore.  Per PT/OT notes, patient is progressing towards goals while in the hospital. - PT/OT recs: CIR - Fall precautions Type 2 diabetes mellitus without complication, without long-term current use of insulin (HCC) Last A1c 6.4.  Home regimen  includes Ozempic 1 mg weekly.  Discontinued sliding scale/CBGs.  BGs over the last 24 hours: 104-121. - Hold Ozempic.  Will call mother this afternoon to discuss timing of last dosing of Ozempic.  Is on 1 mg/week dose at home.  Would be preferable not to have to restart Ozempic dosing from the beginning. - CBGs and MSSI discontinued to limit procedures. Patient BGs relatively well controlled. A1c within acceptable limits on home ozempic monotherapy, will continue on discharge.  Disorientation Patient oriented to self and hospital, but not city, year.  This appears to be his baseline.  A&O - Continue benztropine 0.5 mg daily at bedtime - Continue Depakote 1000 mg every 24 hours at bedtime - Continue phenelzine 30 mg twice daily Persistent atrial fibrillation (HCC) Rates continue to be controlled. - Eliquis currently being held, will resume on 10/9 per neurosurgery. Tremor (Resolved: 04/13/2023) Bilateral upper and lower extremity, poor gait balance. Neurology consulted and recommended outpatient follow up.   Chronic and Stable Issues:  Tremor: Bilateral upper and lower extremity.  Neurology recommended outpatient follow-up. Leukocytosis: 11.4 as of 10/7.  Likely acute phase reactant ISO neurosurgery.  No signs of infection otherwise.  Can recheck CBC if concerns.  FEN/GI: Regular PPx: SCDs Dispo: CIR, awaiting placement  Subjective:   No acute events overnight.  On interview, patient in good spirits.  Happy that they found his car.  Feeling a lot better.  Very interested in further PT/OT.  Has been in touch with mother, concerning placement after CIR.  Wants to eat today.  No new complaints.  Back pain is almost completely resolved.  Objective:  BP: 137/87 HR: 79 RR: 18 T: Afebrile O2sat: 95%  on room air  Significant vitals over past 24 hours: None  Physical Exam:  General: Well-appearing elderly male, sitting up in chair, NAD, attentive to interview.   Cardiovascular: Regular rate  no m/r/g. Respiratory: CTAB. No w/r/r.  Abdomen: No tenderness in 4 quadrants. BS+.  Extremities: ROM intact.  Nonedematous. Neuro: Alert and oriented to self, situation, Clinical research associate.  Basic labs:  Most recent CBC Lab Results  Component Value Date   WBC 10.2 04/12/2023   HGB 13.7 04/12/2023   HCT 41.4 04/12/2023   MCV 90.4 04/12/2023   PLT 143 (L) 04/12/2023   Most recent BMP    Latest Ref Rng & Units 04/12/2023    8:16 AM  BMP  Glucose 70 - 99 mg/dL 94   BUN 8 - 23 mg/dL 16   Creatinine 1.61 - 1.24 mg/dL 0.96   Sodium 045 - 409 mmol/L 136   Potassium 3.5 - 5.1 mmol/L 4.0   Chloride 98 - 111 mmol/L 97   CO2 22 - 32 mmol/L 27   Calcium 8.9 - 10.3 mg/dL 8.5     Other pertinent labs:  No new labs.  Imaging/Diagnostic Tests:  No new imaging.  Tomie China, MD 04/13/2023, 7:24 AM  PGY-1, [facility] Family Medicine FPTS Intern pager: [phone], text pages welcome Secure chat group [facility] Teaching Service

## 2023-04-13 NOTE — Plan of Care (Signed)
  Problem: Education: Goal: Ability to describe self-care measures that may prevent or decrease complications (Diabetes Survival Skills Education) will improve Outcome: Progressing   Problem: Coping: Goal: Ability to adjust to condition or change in health will improve Outcome: Progressing   Problem: Health Behavior/Discharge Planning: Goal: Ability to identify and utilize available resources and services will improve Outcome: Progressing   Problem: Nutritional: Goal: Maintenance of adequate nutrition will improve Outcome: Progressing   Problem: Skin Integrity: Goal: Risk for impaired skin integrity will decrease Outcome: Progressing   Problem: Education: Goal: Knowledge of General Education information will improve Description: Including pain rating scale, medication(s)/side effects and non-pharmacologic comfort measures Outcome: Progressing   Problem: Clinical Measurements: Goal: Will remain free from infection Outcome: Progressing   Problem: Activity: Goal: Risk for activity intolerance will decrease Outcome: Progressing   Problem: Nutrition: Goal: Adequate nutrition will be maintained Outcome: Progressing   Problem: Coping: Goal: Level of anxiety will decrease Outcome: Progressing   Problem: Elimination: Goal: Will not experience complications related to bowel motility Outcome: Progressing Goal: Will not experience complications related to urinary retention Outcome: Progressing   Problem: Pain Managment: Goal: General experience of comfort will improve Outcome: Progressing   Problem: Safety: Goal: Ability to remain free from injury will improve Outcome: Progressing   Problem: Activity: Goal: Ability to avoid complications of mobility impairment will improve Outcome: Progressing Goal: Ability to tolerate increased activity will improve Outcome: Progressing   Problem: Bowel/Gastric: Goal: Gastrointestinal status for postoperative course will  improve Outcome: Progressing

## 2023-04-13 NOTE — TOC Progression Note (Signed)
Transition of Care [facility]) - Progression Note    Patient Details  Name: [name] MRN: [id number] Date of Birth: 1961/05/06  Transition of [facility] CM/SW Contact  Kermit Balo, RN Phone Number: 04/13/2023, 4:17 PM  Clinical Narrative:     [facility] and rehab are not in network with his insurance.  Per mothers request referral sent to [facility] rehab. Awaiting review.  TOC following.  Expected Discharge Plan: Skilled Nursing Facility Barriers to Discharge: Continued Medical Work up  Expected Discharge Plan and Services   Discharge Planning Services: CM Consult   Living arrangements for the past 2 months: Apartment                                       Social Determinants of Health (SDOH) Interventions SDOH Screenings   Food Insecurity: No Food Insecurity (04/04/2023)  Housing: Low Risk  (04/04/2023)  Transportation Needs: No Transportation Needs (04/04/2023)  Utilities: Not At Risk (04/04/2023)  Alcohol Screen: Low Risk  (02/13/2023)  Depression (PHQ2-9): Low Risk  (02/19/2023)  Recent Concern: Depression (PHQ2-9) - Medium Risk (12/21/2022)  Financial Resource Strain: Low Risk  (02/13/2023)  Physical Activity: Inactive (02/13/2023)  Social Connections: Socially Isolated (02/13/2023)  Stress: No Stress Concern Present (02/13/2023)  Tobacco Use: Low Risk  (04/08/2023)  Health Literacy: Adequate Health Literacy (02/13/2023)    Readmission Risk Interventions     No data to display

## 2023-04-14 DIAGNOSIS — R410 Disorientation, unspecified: Secondary | ICD-10-CM | POA: Diagnosis not present

## 2023-04-14 DIAGNOSIS — S12401A Unspecified nondisplaced fracture of fifth cervical vertebra, initial encounter for closed fracture: Secondary | ICD-10-CM | POA: Diagnosis not present

## 2023-04-14 DIAGNOSIS — E119 Type 2 diabetes mellitus without complications: Secondary | ICD-10-CM | POA: Diagnosis not present

## 2023-04-14 DIAGNOSIS — R296 Repeated falls: Secondary | ICD-10-CM | POA: Diagnosis not present

## 2023-04-14 MED ORDER — ALLOPURINOL 100 MG PO TABS
300.0000 mg | ORAL_TABLET | Freq: Every day | ORAL | Status: DC
  Administered 2023-04-14 – 2023-04-19 (×6): 300 mg via ORAL
  Filled 2023-04-14 (×6): qty 3

## 2023-04-14 MED ORDER — APIXABAN 5 MG PO TABS
5.0000 mg | ORAL_TABLET | Freq: Two times a day (BID) | ORAL | Status: DC
  Administered 2023-04-14 – 2023-04-19 (×11): 5 mg via ORAL
  Filled 2023-04-14 (×11): qty 1

## 2023-04-14 NOTE — Assessment & Plan Note (Deleted)
Patient remains at his baseline.  Some visible right upper and lower extremity shaking, per patient this is also baseline. - Continue benztropine 0.5 mg daily at bedtime - Continue Depakote 1000 mg every 24 hours at bedtime - Continue phenelzine 30 mg twice daily

## 2023-04-14 NOTE — Progress Notes (Deleted)
Daily Progress Note Intern Pager: [phone]  Patient name: [name] Medical record number: [id number] Date of birth: July 06, 1961 Age: 62 y.o. Gender: male  Primary Care Provider: Tiffany Kocher, DO Consultants: Neurosurgery Code Status: Full  Pt Overview and Major Events to Date:   [name] is a 61 year old male with profound dementia (A&O to self, family) admitted s/p repeated falls at home and found to have a C5 fracture, status post stabilization with neurosurgery 10/3.  Now pending CIR placement.   9/28: Admitted for AMS.  C5 fracture found on imaging.  C-spine collar placed. 9/29: Psych consulted, decreased his home psych medications to reduce anticholinergic side effects. 9/30: Patient mentation improved.  Begun on heparin 5000 units every 8 hours. 10/1: Reversible causes of dementia (B12, RPR).  B12 negative. 10/3- Operation with neurosurgery: ACDF C5-C6 and overnight observation in neuro-ICU 10/4- Psychiatry signed off 10/5- FMTS resumed care on med-tele floor Assessment & Plan C5 cervical fracture (HCC) Stable. POD # 6 ACDF (possibly some concern for esophageal injury intraoperatively).  [place] J collar still in place.  Patient is not in pain today.  No new complaints. - Per neurosurgery, [place] J collar when out of bed.   - Pain regimen: Tylenol 650 mg every 6 hours - Begin Eliquis 5 mg twice daily, per neurosurgery recs. - Use incentive spirometry every hour - Vital signs with neurochecks every 4 hours - Outpatient neurosurgery follow-up in 3 weeks Multiple falls Family has decided on SNF placement over CIR. Per PT/OT notes, patient is progressing towards goals while in the hospital. - Awaiting placement. - PT/OT recs: CIR - Fall precautions Disorientation Patient remains at his baseline.  Some visible right upper and lower extremity shaking, per patient this is also baseline. - Continue benztropine 0.5 mg daily at bedtime - Continue Depakote 1000 mg every  24 hours at bedtime - Continue phenelzine 30 mg twice daily Persistent atrial fibrillation (HCC) HR over past 24 hours: 70-92.  Well-controlled. - Begin Eliquis as above. Type 2 diabetes mellitus without complication, without long-term current use of insulin (HCC) Last A1c 6.4.  Home regimen includes Ozempic 1 mg weekly.  Discontinued sliding scale/CBGs. - CBGs and MSSI discontinued to limit procedures. Patient BGs relatively well controlled. A1c within acceptable limits on home ozempic monotherapy, will continue on discharge.   Chronic and Stable Issues:  Tremor: Bilateral upper and lower extremity.  Neurology recommended outpatient follow-up. Leukocytosis: 11.4 as of 10/7.  Likely acute phase reactant ISO neurosurgery.  No signs of infection otherwise.  Can recheck CBC if concerns.  FEN/GI: Regular PPx: SCDs Dispo: Family is looking for SNF care close to mother's home  Subjective:   On interview, patient is lying in bed.  In good spirits.  Working well with PT/OT.  Was able to get to the bathroom by himself yesterday.  Enjoys watching TV.  Comfortable with discharge after particulars are figured out.  No new complaints.  Objective:  BP: 129/82 HR: 84 RR: 18 T: 97.8 O2sat: 94% on room air  Significant vitals over past 24 hours: None.  Physical Exam:  General: Well-appearing elderly male, NAD, attentive to interview. Cardiovascular: Regular rate no m/r/g. Respiratory: CTAB. No w/r/r.  Abdomen: No tenderness in 4 quadrants. BS+.  Extremities: ROM intact.  Nonedematous. Neuro: Alert and oriented to self, situation, Clinical research associate.  Basic labs:  Most recent CBC Lab Results  Component Value Date   WBC 10.2 04/12/2023   HGB 13.7 04/12/2023   HCT 41.4 04/12/2023  MCV 90.4 04/12/2023   PLT 143 (L) 04/12/2023   Most recent BMP    Latest Ref Rng & Units 04/12/2023    8:16 AM  BMP  Glucose 70 - 99 mg/dL 94   BUN 8 - 23 mg/dL 16   Creatinine 4.09 - 1.24 mg/dL 8.11   Sodium 914 -  782 mmol/L 136   Potassium 3.5 - 5.1 mmol/L 4.0   Chloride 98 - 111 mmol/L 97   CO2 22 - 32 mmol/L 27   Calcium 8.9 - 10.3 mg/dL 8.5     Other pertinent labs:  No new labs.  Imaging/Diagnostic Tests:  No new imaging.  Tomie China, MD 04/14/2023, 7:23 AM  PGY-1, [facility] Family Medicine FPTS Intern pager: [phone], text pages welcome Secure chat group [facility] Teaching Service

## 2023-04-14 NOTE — Assessment & Plan Note (Deleted)
Family has decided on SNF placement over CIR. Per PT/OT notes, patient is progressing towards goals while in the hospital. - Awaiting placement. - PT/OT recs: CIR - Fall precautions

## 2023-04-14 NOTE — Assessment & Plan Note (Deleted)
Stable. POD # 6 ACDF (possibly some concern for esophageal injury intraoperatively).  Mi[place] J collar still in place.  Patient is not in pain today.  No new complaints. - Per neurosurgery, [place] J collar when out of bed.   - Pain regimen: Tylenol 650 mg every 6 hours - Begin Eliquis 5 mg twice daily, per neurosurgery recs. - Use incentive spirometry every hour - Vital signs with neurochecks every 4 hours - Outpatient neurosurgery follow-up in 3 weeks

## 2023-04-14 NOTE — Assessment & Plan Note (Deleted)
HR over past 24 hours: 70-92.  Well-controlled. - Begin Eliquis as above.

## 2023-04-14 NOTE — Progress Notes (Signed)
Daily Progress Note Intern Pager: [phone]   PGY-1, [facility] Family Medicine FPTS Intern pager: [phone], text pages welcome Secure chat group [facility] Teaching Service    Patient name: [name]  Medical record number: [id number] Date of birth: 1961/04/13             Age: 62 y.o.       Gender: male   Primary Care Provider: Tiffany Kocher, DO Consultants: Neurosurgery Code Status: Full   Pt Overview and Major Events to Date:    Damone Fancher is a 62 year old male with profound dementia (A&O to self, family) admitted s/p repeated falls at home and found to have a C5 fracture, status post stabilization with neurosurgery 10/3.  Now pending SNF placement.   9/28: Admitted for AMS.  C5 fracture found on imaging.  C-spine collar placed. 9/29: Psych consulted, decreased his home psych medications to reduce anticholinergic side effects. 9/30: Patient mentation improved.  Begun on heparin 5000 units every 8 hours. 10/1: Reversible causes of dementia (B12, RPR).  B12 negative. 10/3- Operation with neurosurgery: ACDF C5-C6 and overnight observation in neuro-ICU 10/4- Psychiatry signed off 10/5- FMTS resumed care on med-tele floor Assessment & Plan C5 cervical fracture (HCC) Stable. POD # 6 ACDF (possibly some concern for esophageal injury intraoperatively).  [place] J collar still in place. Patient is not in pain today.  No new complaints. - Per neurosurgery, [place] J collar when out of bed.   - Pain regimen: Tylenol 650 mg every 6 hours - Begin Eliquis 5 mg twice daily, per neurosurgery recs. - Use incentive spirometry every hour - Vital signs with neurochecks every 4 hours - Outpatient neurosurgery follow-up in 3 weeks Multiple falls Family has decided on SNF placement over CIR. Per PT/OT notes, patient is progressing towards goals while in the hospital. - Awaiting placement. - PT/OT recs: CIR - Fall precautions Disorientation Patient remains at his  baseline. Pleasant. Some visible right upper and lower extremity shaking, per patient this is also baseline. - Continue benztropine 0.5 mg daily at bedtime - Continue Depakote 1000 mg every 24 hours at bedtime - Continue phenelzine 30 mg twice daily Persistent atrial fibrillation (HCC) HR over past 24 hours: 70-92.  Well-controlled. - Begin Eliquis as above. Type 2 diabetes mellitus without complication, without long-term current use of insulin (HCC) Last A1c 6.4.  Home regimen includes Ozempic 1 mg weekly.  Discontinued sliding scale/CBGs. - CBGs and MSSI discontinued to limit procedures. Patient BGs relatively well controlled. A1c within acceptable limits on home ozempic monotherapy, will continue on discharge.    Chronic and Stable Issues:   Tremor: Bilateral upper and lower extremity.  Neurology recommended outpatient follow-up. Leukocytosis: 11.4 as of 10/7.  Likely acute phase reactant ISO neurosurgery.  No signs of infection otherwise.  Can recheck CBC if concerns. Gout: Started allopurinol.    FEN/GI: Regular PPx: SCDs Dispo: Family is looking for SNF care close to mother's home   Subjective:    On interview, patient is lying in bed.  In good spirits.  Working well with PT/OT.  Was able to get to the bathroom by himself yesterday.  Enjoys watching TV.  Comfortable with discharge after particulars are figured out.  No new complaints.   Objective:   BP: 129/82 HR: 84 RR: 18 T: 97.8 O2sat: 94% on room air   Significant vitals over past 24 hours: None.   Physical Exam:   General: Well-appearing elderly male, NAD, attentive to interview. Cardiovascular:  Regular rate no m/r/g. Respiratory: CTAB. No w/r/r.  Abdomen: No tenderness in 4 quadrants. BS+.  Extremities: ROM intact.  Nonedematous. Neuro: Alert and oriented to self, situation, Clinical research associate.   Basic labs:   Most recent CBC Recent Labs       Lab Results  Component Value Date    WBC 10.2 04/12/2023    HGB 13.7  04/12/2023    HCT 41.4 04/12/2023    MCV 90.4 04/12/2023    PLT 143 (L) 04/12/2023      Most recent BMP     Latest Ref Rng & Units 04/12/2023    8:16 AM  BMP  Glucose 70 - 99 mg/dL 94   BUN 8 - 23 mg/dL 16   Creatinine 1.61 - 1.24 mg/dL 0.96   Sodium 045 - 409 mmol/L 136   Potassium 3.5 - 5.1 mmol/L 4.0   Chloride 98 - 111 mmol/L 97   CO2 22 - 32 mmol/L 27   Calcium 8.9 - 10.3 mg/dL 8.5       Other pertinent labs:   No new labs.   Imaging/Diagnostic Tests:   No new imaging.   Tomie China, MD 04/14/2023, 7:23 AM   PGY-1, [facility] Family Medicine FPTS Intern pager: [phone], text pages welcome Secure chat group [facility] Teaching Service

## 2023-04-14 NOTE — Progress Notes (Signed)
Physical Therapy Treatment  Patient Details Name: [name] MRN: [id number] DOB: March 08, 1961 Today's Date: 04/14/2023   History of Present Illness Pt is a 62 y.o. male presenting via PTAR on 9/28 after recurrent falls. MRI cervical spine revealed acute fx extending through a bridging osteophyte at C5-6,  with extension through the underlying superior endplate of C6.  Subtle widening of the C5-6 interspace. Overall, constellation of  findings are consistent with an acute hyperextension injury. Acute anterior longitudinal ligament injury. S/p ACDF C5-6 10/3. PMH significant for significant for seizures, hypothyroidism, A-fib anticoagulated with Eliquis, DM, MDD, Anxiety and distant dx of schizoaffective d/o and OCD    PT Comments  Pt progressing towards physical therapy goals. Was able to perform transfers and ambulation with min-mod assist and RW for support. Pt motivated to participate with PT and continue to recommend follow up rehab at d/c.     If plan is discharge home, recommend the following: A lot of help with walking and/or transfers;A lot of help with bathing/dressing/bathroom;Assistance with cooking/housework;Direct supervision/assist for financial management;Direct supervision/assist for medications management;Assist for transportation;Help with stairs or ramp for entrance;Supervision due to cognitive status   Can travel by private vehicle        Equipment Recommendations  Rolling walker (2 wheels);BSC/3in1    Recommendations for Other Services Rehab consult     Precautions / Restrictions Precautions Precautions: Fall;Cervical Precaution Booklet Issued: No Precaution Comments: tremors Required Braces or Orthoses: Cervical Brace Cervical Brace: Hard collar;Other (comment) (May remove when in bed, apply/remove in sitting, may remove to shower, may NOT ambulate to bathroom without brace) Restrictions Weight Bearing Restrictions: No     Mobility  Bed Mobility Overal bed  mobility: Needs Assistance Bed Mobility: Rolling, Sidelying to Sit Rolling: Supervision Sidelying to sit: HOB elevated, Mod assist       General bed mobility comments: VC's for log roll technique. Pt able to roll to sidelying with use of rail but required mod assist for trunk elevation to full sitting position.    Transfers Overall transfer level: Needs assistance Equipment used: Rolling walker (2 wheels) Transfers: Sit to/from Stand Sit to Stand: Min assist, Mod assist           General transfer comment: VC's for hand placement on seated surface for safety. Initially required mod assist due to posterior lean but on second attempt required min assist and demonstrated improved anterior lean to get COM over BOS.    Ambulation/Gait Ambulation/Gait assistance: Min assist Gait Distance (Feet): 150 Feet Assistive device: Rolling walker (2 wheels) Gait Pattern/deviations: Decreased step length - left, Decreased stride length, Decreased dorsiflexion - right, Decreased dorsiflexion - left, Knee flexed in stance - right, Knee flexed in stance - left, Shuffle, Narrow base of support, Decreased step length - right, Step-through pattern Gait velocity: Decreased Gait velocity interpretation: 1.31 - 2.62 ft/sec, indicative of limited community ambulator   General Gait Details: Slow gait speed with decreased step/stride length. Pt was able to make corrective changes and lengthen stride but not able to maintain corrective changes. Assist for balance support and walker management.   Stairs             Wheelchair Mobility     Tilt Bed    Modified Rankin (Stroke Patients Only)       Balance Overall balance assessment: Needs assistance Sitting-balance support: Single extremity supported, Bilateral upper extremity supported, Feet supported Sitting balance-Leahy Scale: Fair     Standing balance support: Bilateral upper extremity supported, During  functional activity, Reliant on  assistive device for balance Standing balance-Leahy Scale: Poor Standing balance comment: Reliant on RW and external physical assistance                            Cognition Arousal: Alert Behavior During Therapy: WFL for tasks assessed/performed Overall Cognitive Status: No family/caregiver present to determine baseline cognitive functioning                                 General Comments: Poor recall/maintenance of back precautions        Exercises      General Comments General comments (skin integrity, edema, etc.): VSS on RA      Pertinent Vitals/Pain Pain Assessment Pain Assessment: No/denies pain Pain Intervention(s): Monitored during session    Home Living                          Prior Function            PT Goals (current goals can now be found in the care plan section) Acute Rehab PT Goals Patient Stated Goal: to improve PT Goal Formulation: With patient Time For Goal Achievement: 04/23/23 Potential to Achieve Goals: Good Progress towards PT goals: Progressing toward goals    Frequency    Min 1X/week      PT Plan      Co-evaluation              AM-PAC PT "6 Clicks" Mobility   Outcome Measure  Help needed turning from your back to your side while in a flat bed without using bedrails?: A Little Help needed moving from lying on your back to sitting on the side of a flat bed without using bedrails?: A Lot Help needed moving to and from a bed to a chair (including a wheelchair)?: A Little Help needed standing up from a chair using your arms (e.g., wheelchair or bedside chair)?: A Lot Help needed to walk in hospital room?: A Little Help needed climbing 3-5 steps with a railing? : A Lot 6 Click Score: 15    End of Session Equipment Utilized During Treatment: Gait belt;Cervical collar Activity Tolerance: Patient tolerated treatment well Patient left: in chair;with call bell/phone within reach;with chair  alarm set Nurse Communication: Mobility status PT Visit Diagnosis: Unsteadiness on feet (R26.81);Other abnormalities of gait and mobility (R26.89);Muscle weakness (generalized) (M62.81);History of falling (Z91.81);Repeated falls (R29.6);Difficulty in walking, not elsewhere classified (R26.2)     Time: 1610-9604 PT Time Calculation (min) (ACUTE ONLY): 23 min  Charges:    $Gait Training: 23-37 mins PT General Charges $$ ACUTE PT VISIT: 1 Visit                     Conni Slipper, PT, DPT Acute Rehabilitation Services Secure Chat Preferred Office: [phone]    Marylynn Pearson 04/14/2023, 3:15 PM

## 2023-04-14 NOTE — Discharge Instructions (Addendum)
[name],  This is Dr. Okey Dupre, from the [facility] Medicine hospital team!  It was very nice to meet you. It made me glad to see you get better. I very much hope that you enjoy your new living arrangement.  Please follow these instructions:  1) Be very careful not to fall!  Do not be afraid to take things slowly -- give yourself as much time as you need.  While there will be people nearby to help prevent falls, it is very important to be thoughtful when moving around. I've added a sheet that shows things you can do to stop falls.  2) Take your medications! Make sure to put your medications in a pill manager/box so that you do not forget to take them. The medicines are good but only work if you take them regularly.  3) Go to your doctor appointments! There is one for mental health on October 17th. There is another for neurosurgery on December 9th. You must wear your neck brace until you see the neurosurgeon. Be sure to let your family know about these, so they know when to go with you.  4) Have (safe) fun with family and friends! It's very important for your brain health to enjoy your time with others, and to go outside from time to time. (Yes, playing cards with your mom is good for you!)  It was very nice to get to know you a little bit, and I wish you all of the best!  Luiz Iron, MD Family medicine, PGY-1

## 2023-04-14 NOTE — Assessment & Plan Note (Deleted)
Last A1c 6.4.  Home regimen includes Ozempic 1 mg weekly.  Discontinued sliding scale/CBGs. - CBGs and MSSI discontinued to limit procedures. Patient BGs relatively well controlled. A1c within acceptable limits on home ozempic monotherapy, will continue on discharge.

## 2023-04-14 NOTE — Plan of Care (Signed)
  Problem: Education: Goal: Ability to describe self-care measures that may prevent or decrease complications (Diabetes Survival Skills Education) will improve Outcome: Progressing   Problem: Coping: Goal: Ability to adjust to condition or change in health will improve Outcome: Progressing   Problem: Fluid Volume: Goal: Ability to maintain a balanced intake and output will improve Outcome: Progressing   Problem: Skin Integrity: Goal: Risk for impaired skin integrity will decrease Outcome: Progressing   

## 2023-04-14 NOTE — TOC Progression Note (Signed)
Transition of Care [facility]) - Progression Note    Patient Details  Name: [name] MRN: [id number] Date of Birth: Aug 13, 1960  Transition [facility]) CM/SW Contact  Kermit Balo, RN Phone Number: 04/14/2023, 11:39 AM  Clinical Narrative:     Voicemail left for [facility] is reviewing the referral [facility]--faxed referral  TOC following.  Expected Discharge Plan: Skilled Nursing Facility Barriers to Discharge: Continued Medical Work up  Expected Discharge Plan and Services   Discharge Planning Services: CM Consult   Living arrangements for the past 2 months: Apartment                                       Social Determinants of Health (SDOH) Interventions SDOH Screenings   Food Insecurity: No Food Insecurity (04/04/2023)  Housing: Low Risk  (04/04/2023)  Transportation Needs: No Transportation Needs (04/04/2023)  Utilities: Not At Risk (04/04/2023)  Alcohol Screen: Low Risk  (02/13/2023)  Depression (PHQ2-9): Low Risk  (02/19/2023)  Recent Concern: Depression (PHQ2-9) - Medium Risk (12/21/2022)  Financial Resource Strain: Low Risk  (02/13/2023)  Physical Activity: Inactive (02/13/2023)  Social Connections: Socially Isolated (02/13/2023)  Stress: No Stress Concern Present (02/13/2023)  Tobacco Use: Low Risk  (04/08/2023)  Health Literacy: Adequate Health Literacy (02/13/2023)    Readmission Risk Interventions     No data to display

## 2023-04-14 NOTE — Progress Notes (Signed)
Occupational Therapy Treatment Patient Details Name: [name] MRN: [id number] DOB: January 30, 1961 Today's Date: 04/14/2023   History of present illness Pt is a 62 y.o. male presenting via PTAR on 9/28 after recurrent falls. MRI cervical spine revealed acute fx extending through a bridging osteophyte at C5-6,  with extension through the underlying superior endplate of C6.  Subtle widening of the C5-6 interspace. Overall, constellation of  findings are consistent with an acute hyperextension injury. Acute anterior longitudinal ligament injury. S/p ACDF C5-6 10/3. PMH significant for significant for seizures, hypothyroidism, A-fib anticoagulated with Eliquis, DM, MDD, Anxiety and distant dx of schizoaffective d/o and OCD   OT comments  Pt progressing well towards goals but continues to need mod cues for precautions, pt with no recall of cervical precautions or log roll technique for bed mobility. Pt able to perform standing grooming tasks with minA, mod A for UB/LB dressing and min A overall for transfers using RW. Pt min A for bed mobility. Introduced use of AE for LB ADLs and pt able to demo. Pt presenting with impairments listed below, will follow acutely. Patient will benefit from continued inpatient follow up therapy, <3 hours/day to maximize safety/ind with ADLs/functional mobility.       If plan is discharge home, recommend the following:  A little help with walking and/or transfers;A lot of help with bathing/dressing/bathroom;Assistance with cooking/housework;Direct supervision/assist for medications management;Direct supervision/assist for financial management;Assist for transportation;Supervision due to cognitive status;Help with stairs or ramp for entrance   Equipment Recommendations  BSC/3in1;Other (comment) (RW)    Recommendations for Other Services PT consult    Precautions / Restrictions Precautions Precautions: Fall;Cervical Precaution Booklet Issued: Yes (comment) (will need  frequent reinforcement) Precaution Comments: tremors Required Braces or Orthoses: Cervical Brace Cervical Brace: Hard collar;Other (comment) (May remove when in bed, apply/remove in sitting, may remove to shower, may NOT ambulate to bathroom without brace) Restrictions Weight Bearing Restrictions: No       Mobility Bed Mobility Overal bed mobility: Needs Assistance Bed Mobility: Sit to Sidelying         Sit to sidelying: Min assist General bed mobility comments: to assist BLE's, cues for log roll technique as pt with no recall    Transfers Overall transfer level: Needs assistance Equipment used: Rolling walker (2 wheels) Transfers: Sit to/from Stand Sit to Stand: Min assist                 Balance Overall balance assessment: Needs assistance Sitting-balance support: Single extremity supported, Bilateral upper extremity supported, Feet supported Sitting balance-Leahy Scale: Fair Sitting balance - Comments: UE support, CGA-minA for static sitting balance.   Standing balance support: Bilateral upper extremity supported, During functional activity, Reliant on assistive device for balance Standing balance-Leahy Scale: Poor Standing balance comment: cues to remain upright, pt with trunk and knee flexion with fatigue                           ADL either performed or assessed with clinical judgement   ADL       Grooming: Oral care;Wash/dry face;Minimal assistance;Standing Grooming Details (indicate cue type and reason): minA to hold cup for spitting in         Upper Body Dressing : Moderate assistance Upper Body Dressing Details (indicate cue type and reason): donning gown Lower Body Dressing: Moderate assistance;Cueing for back precautions;Cueing for compensatory techniques Lower Body Dressing Details (indicate cue type and reason): increased mobility in L leg  compared to R leg, mod A to don socks with figure 4 Toilet Transfer: Minimal  assistance;Ambulation;Rolling walker (2 wheels);Regular Toilet           Functional mobility during ADLs: Minimal assistance;Rolling walker (2 wheels) General ADL Comments: educated on use of AE for LB ADL    Extremity/Trunk Assessment Upper Extremity Assessment Upper Extremity Assessment: Right hand dominant RUE Deficits / Details: tremor RUE Coordination: decreased gross motor;decreased fine motor LUE Deficits / Details: tremor LUE Coordination: decreased gross motor;decreased fine motor   Lower Extremity Assessment Lower Extremity Assessment: Generalized weakness        Vision   Vision Assessment?: No apparent visual deficits   Perception Perception Perception: Not tested   Praxis Praxis Praxis: Not tested    Cognition Arousal: Alert Behavior During Therapy: WFL for tasks assessed/performed Overall Cognitive Status: No family/caregiver present to determine baseline cognitive functioning                                 General Comments: pt needing incr cues for initiation of BADL tasks, unable to recall date or month, no recall of cervical precautions. Delayed processing for commands, benefits from repetition        Exercises      Shoulder Instructions       General Comments VSS on RA    Pertinent Vitals/ Pain       Pain Assessment Pain Assessment: Faces Pain Score: 2  Faces Pain Scale: Hurts a little bit Pain Location: R leg Pain Descriptors / Indicators: Discomfort, Cramping Pain Intervention(s): Limited activity within patient's tolerance, Monitored during session, Repositioned  Home Living                                          Prior Functioning/Environment              Frequency  Min 1X/week        Progress Toward Goals  OT Goals(current goals can now be found in the care plan section)  Progress towards OT goals: Progressing toward goals  Acute Rehab OT Goals Patient Stated Goal: none stated OT  Goal Formulation: With patient Time For Goal Achievement: 04/23/23 Potential to Achieve Goals: Good ADL Goals Pt Will Perform Grooming: with set-up;sitting Pt Will Perform Upper Body Bathing: with set-up;sitting Pt Will Perform Lower Body Bathing: with set-up;sit to/from stand Pt Will Perform Upper Body Dressing: with set-up;sitting Pt Will Perform Lower Body Dressing: with mod assist;sit to/from stand Pt Will Transfer to Toilet: with min assist;ambulating;bedside commode Additional ADL Goal #1: pt will complete bed mobility CGA as precursor to adls. Additional ADL Goal #2: pt will demonstrate cervical precautions and verbalize no lifting 100% accuracy  Plan      Co-evaluation                 AM-PAC OT "6 Clicks" Daily Activity     Outcome Measure   Help from another person eating meals?: A Little Help from another person taking care of personal grooming?: A Little Help from another person toileting, which includes using toliet, bedpan, or urinal?: A Lot Help from another person bathing (including washing, rinsing, drying)?: A Lot Help from another person to put on and taking off regular upper body clothing?: A Lot Help from another person to put on and taking off  regular lower body clothing?: A Lot 6 Click Score: 14    End of Session Equipment Utilized During Treatment: Gait belt;Rolling walker (2 wheels);Cervical collar  OT Visit Diagnosis: Unsteadiness on feet (R26.81);Muscle weakness (generalized) (M62.81)   Activity Tolerance Patient tolerated treatment well   Patient Left in bed;with call bell/phone within reach;with bed alarm set   Nurse Communication Mobility status;Precautions (neck bandage peeling off)        Time: 1422-1500 OT Time Calculation (min): 38 min  Charges: OT General Charges $OT Visit: 1 Visit OT Treatments $Self Care/Home Management : 38-52 mins  [name], [name], [name] SecureChat Preferred Acute Rehab [phone]   Anthony Fiorito K  Trujillo 04/14/2023, 3:13 PM

## 2023-04-15 DIAGNOSIS — E119 Type 2 diabetes mellitus without complications: Secondary | ICD-10-CM | POA: Diagnosis not present

## 2023-04-15 DIAGNOSIS — S12491D Other nondisplaced fracture of fifth cervical vertebra, subsequent encounter for fracture with routine healing: Secondary | ICD-10-CM

## 2023-04-15 DIAGNOSIS — R296 Repeated falls: Secondary | ICD-10-CM | POA: Diagnosis not present

## 2023-04-15 NOTE — Assessment & Plan Note (Signed)
Patient oriented to self and hospital, but not city, year.  This appears to be his baseline.  A&O - Continue benztropine 0.5 mg daily at bedtime - Continue Depakote 1000 mg every 24 hours at bedtime - Continue phenelzine 30 mg twice daily

## 2023-04-15 NOTE — Plan of Care (Signed)
  Problem: Education: Goal: Ability to describe self-care measures that may prevent or decrease complications (Diabetes Survival Skills Education) will improve Outcome: Progressing Goal: Individualized Educational Video(s) Outcome: Progressing   Problem: Coping: Goal: Ability to adjust to condition or change in health will improve Outcome: Progressing   Problem: Fluid Volume: Goal: Ability to maintain a balanced intake and output will improve Outcome: Progressing   Problem: Health Behavior/Discharge Planning: Goal: Ability to identify and utilize available resources and services will improve Outcome: Progressing Goal: Ability to manage health-related needs will improve Outcome: Progressing   Problem: Metabolic: Goal: Ability to maintain appropriate glucose levels will improve Outcome: Progressing   Problem: Nutritional: Goal: Maintenance of adequate nutrition will improve Outcome: Progressing Goal: Progress toward achieving an optimal weight will improve Outcome: Progressing   Problem: Skin Integrity: Goal: Risk for impaired skin integrity will decrease Outcome: Progressing   Problem: Tissue Perfusion: Goal: Adequacy of tissue perfusion will improve Outcome: Progressing   Problem: Education: Goal: Knowledge of General Education information will improve Description: Including pain rating scale, medication(s)/side effects and non-pharmacologic comfort measures Outcome: Progressing   Problem: Health Behavior/Discharge Planning: Goal: Ability to manage health-related needs will improve Outcome: Progressing   Problem: Clinical Measurements: Goal: Ability to maintain clinical measurements within normal limits will improve Outcome: Progressing Goal: Will remain free from infection Outcome: Progressing Goal: Diagnostic test results will improve Outcome: Progressing Goal: Respiratory complications will improve Outcome: Progressing Goal: Cardiovascular complication will  be avoided Outcome: Progressing   Problem: Activity: Goal: Risk for activity intolerance will decrease Outcome: Progressing   Problem: Nutrition: Goal: Adequate nutrition will be maintained Outcome: Progressing   Problem: Coping: Goal: Level of anxiety will decrease Outcome: Progressing   Problem: Elimination: Goal: Will not experience complications related to bowel motility Outcome: Progressing Goal: Will not experience complications related to urinary retention Outcome: Progressing   Problem: Pain Managment: Goal: General experience of comfort will improve Outcome: Progressing   Problem: Safety: Goal: Ability to remain free from injury will improve Outcome: Progressing   Problem: Skin Integrity: Goal: Risk for impaired skin integrity will decrease Outcome: Progressing   Problem: Education: Goal: Ability to verbalize activity precautions or restrictions will improve Outcome: Progressing Goal: Knowledge of the prescribed therapeutic regimen will improve Outcome: Progressing Goal: Understanding of discharge needs will improve Outcome: Progressing   Problem: Activity: Goal: Ability to avoid complications of mobility impairment will improve Outcome: Progressing Goal: Ability to tolerate increased activity will improve Outcome: Progressing Goal: Will remain free from falls Outcome: Progressing   Problem: Bowel/Gastric: Goal: Gastrointestinal status for postoperative course will improve Outcome: Progressing   Problem: Clinical Measurements: Goal: Ability to maintain clinical measurements within normal limits will improve Outcome: Progressing Goal: Postoperative complications will be avoided or minimized Outcome: Progressing Goal: Diagnostic test results will improve Outcome: Progressing   Problem: Pain Management: Goal: Pain level will decrease Outcome: Progressing   Problem: Skin Integrity: Goal: Will show signs of wound healing Outcome: Progressing    Problem: Health Behavior/Discharge Planning: Goal: Identification of resources available to assist in meeting health care needs will improve Outcome: Progressing   Problem: Bladder/Genitourinary: Goal: Urinary functional status for postoperative course will improve Outcome: Progressing   

## 2023-04-15 NOTE — TOC Progression Note (Addendum)
Transition of Care [facility]) - Progression Note    Patient Details  Name: [name] MRN: [id number] Date of Birth: 1960/12/13  Transition of [facility]) CM/SW Contact  Kermit Balo, RN Phone Number: 04/15/2023, 2:52 PM  Clinical Narrative:     [facility] in [place] has offered him a bed. CM has left a voicemail for the patients mother. CM has updated the patient.  This is the only offer the patient has received.  CM has asked TOC MOA to begin insurance auth with [employer].  Mom and brother to provide transportation when we receive auth.  TOC following.  1537: CM spoke to patient's mother and she is in agreement with [facility]. Having trouble getting auth submitted. CM has left voicemail with facility to see if they can assist.   Expected Discharge Plan: Skilled Nursing Facility Barriers to Discharge: Continued Medical Work up  Expected Discharge Plan and Services   Discharge Planning Services: CM Consult   Living arrangements for the past 2 months: Apartment                                       Social Determinants of Health (SDOH) Interventions SDOH Screenings   Food Insecurity: No Food Insecurity (04/04/2023)  Housing: Low Risk  (04/04/2023)  Transportation Needs: No Transportation Needs (04/04/2023)  Utilities: Not At Risk (04/04/2023)  Alcohol Screen: Low Risk  (02/13/2023)  Depression (PHQ2-9): Low Risk  (02/19/2023)  Recent Concern: Depression (PHQ2-9) - Medium Risk (12/21/2022)  Financial Resource Strain: Low Risk  (02/13/2023)  Physical Activity: Inactive (02/13/2023)  Social Connections: Socially Isolated (02/13/2023)  Stress: No Stress Concern Present (02/13/2023)  Tobacco Use: Low Risk  (04/08/2023)  Health Literacy: Adequate Health Literacy (02/13/2023)    Readmission Risk Interventions     No data to display

## 2023-04-15 NOTE — Assessment & Plan Note (Signed)
Last A1c 6.4.  Home regimen includes Ozempic 1 mg weekly.  Discontinued sliding scale/CBGs.  - Can restart Ozempic at SNF. - CBGs and MSSI discontinued to limit procedures. Patient BGs relatively well controlled. A1c within acceptable limits on home ozempic monotherapy, will continue on discharge.

## 2023-04-15 NOTE — Assessment & Plan Note (Signed)
Awaiting CIR placement. Mother is actively looking for long-term housing after rehab, as he has had multiple falls and and likely cannot live by himself anymore.  Per PT/OT notes, patient is progressing towards goals while in the hospital. - PT/OT recs: CIR - Fall precautions

## 2023-04-15 NOTE — Plan of Care (Signed)
  Problem: Education: Goal: Ability to describe self-care measures that may prevent or decrease complications (Diabetes Survival Skills Education) will improve Outcome: Progressing   Problem: Coping: Goal: Ability to adjust to condition or change in health will improve Outcome: Progressing   Problem: Fluid Volume: Goal: Ability to maintain a balanced intake and output will improve Outcome: Progressing   Problem: Health Behavior/Discharge Planning: Goal: Ability to identify and utilize available resources and services will improve Outcome: Progressing   Problem: Metabolic: Goal: Ability to maintain appropriate glucose levels will improve Outcome: Progressing   Problem: Skin Integrity: Goal: Risk for impaired skin integrity will decrease Outcome: Progressing   Problem: Tissue Perfusion: Goal: Adequacy of tissue perfusion will improve Outcome: Progressing   Problem: Nutritional: Goal: Maintenance of adequate nutrition will improve Outcome: Progressing Goal: Progress toward achieving an optimal weight will improve Outcome: Progressing   Problem: Education: Goal: Knowledge of General Education information will improve Description: Including pain rating scale, medication(s)/side effects and non-pharmacologic comfort measures Outcome: Progressing   Problem: Clinical Measurements: Goal: Ability to maintain clinical measurements within normal limits will improve Outcome: Progressing Goal: Will remain free from infection Outcome: Progressing Goal: Diagnostic test results will improve Outcome: Progressing Goal: Respiratory complications will improve Outcome: Progressing Goal: Cardiovascular complication will be avoided Outcome: Progressing   Problem: Coping: Goal: Level of anxiety will decrease Outcome: Progressing   Problem: Activity: Goal: Risk for activity intolerance will decrease Outcome: Progressing   Problem: Elimination: Goal: Will not experience complications  related to bowel motility Outcome: Progressing Goal: Will not experience complications related to urinary retention Outcome: Progressing   Problem: Safety: Goal: Ability to remain free from injury will improve Outcome: Progressing   Problem: Skin Integrity: Goal: Risk for impaired skin integrity will decrease Outcome: Progressing   Problem: Education: Goal: Ability to verbalize activity precautions or restrictions will improve Outcome: Progressing Goal: Knowledge of the prescribed therapeutic regimen will improve Outcome: Progressing Goal: Understanding of discharge needs will improve Outcome: Progressing   Problem: Activity: Goal: Ability to avoid complications of mobility impairment will improve Outcome: Progressing Goal: Ability to tolerate increased activity will improve Outcome: Progressing Goal: Will remain free from falls Outcome: Progressing   Problem: Clinical Measurements: Goal: Ability to maintain clinical measurements within normal limits will improve Outcome: Progressing Goal: Postoperative complications will be avoided or minimized Outcome: Progressing Goal: Diagnostic test results will improve Outcome: Progressing   Problem: Pain Management: Goal: Pain level will decrease Outcome: Progressing   Problem: Health Behavior/Discharge Planning: Goal: Identification of resources available to assist in meeting health care needs will improve Outcome: Progressing   Problem: Bladder/Genitourinary: Goal: Urinary functional status for postoperative course will improve Outcome: Progressing

## 2023-04-15 NOTE — Assessment & Plan Note (Addendum)
No new changes from FMTS standpoint today.  Continue to await SNF placement. Stable. POD # 7 ACDF (possibly some concern for esophageal injury intraoperatively).  Pulled Hemovac yesterday 10/6.  Mi[place] J collar still in place.  Pain well-controlled. - Per neurosurgery, [place] J collar when out of bed.   - Pain regimen: Tylenol 650 mg every 6 hours - Begin Eliquis Wednesday 10/9, per neurosurgery note - Use incentive spirometry every hour - Vital signs with neurochecks every 4 hours - Outpatient neurosurgery follow-up in 3 weeks

## 2023-04-15 NOTE — Assessment & Plan Note (Signed)
Rates continue to be controlled. - Eliquis currently being held, will resume on 10/9 per neurosurgery.

## 2023-04-15 NOTE — Progress Notes (Signed)
Daily Progress Note Intern Pager: [phone]  Patient name: [name] Medical record number: [id number] Date of birth: September 08, 1960 Age: 62 y.o. Gender: male  Primary Care Provider: Tiffany Kocher, DO Consultants: Neurosurgery Code Status: Full  Pt Overview and Major Events to Date:   [name] is a 62 year old male with profound dementia (A&O to self, family) admitted s/p repeated falls at home and found to have a C5 fracture, status post stabilization with neurosurgery 10/3.  Now pending SNF placement.   9/28: Admitted for AMS.  C5 fracture found on imaging.  C-spine collar placed. 9/29: Psych consulted, decreased his home psych medications to reduce anticholinergic side effects. 9/30: Patient mentation improved.  Begun on heparin 5000 units every 8 hours. 10/1: Reversible causes of dementia (B12, RPR).  B12 negative. 10/3- Operation with neurosurgery: ACDF C5-C6 and overnight observation in neuro-ICU 10/4- Psychiatry signed off 10/5- FMTS resumed care on med-tele floor Assessment & Plan C5 cervical fracture (HCC) No new changes from FMTS standpoint today.  Continue to await SNF placement. Stable. POD # 7 ACDF (possibly some concern for esophageal injury intraoperatively).  Pulled Hemovac yesterday 10/6.  [place] J collar still in place.  Pain well-controlled. - Per neurosurgery, [place] J collar when out of bed.   - Pain regimen: Tylenol 650 mg every 6 hours - Begin Eliquis Wednesday 10/9, per neurosurgery note - Use incentive spirometry every hour - Vital signs with neurochecks every 4 hours - Outpatient neurosurgery follow-up in 3 weeks Multiple falls Awaiting CIR placement. Mother is actively looking for long-term housing after rehab, as he has had multiple falls and and likely cannot live by himself anymore.  Per PT/OT notes, patient is progressing towards goals while in the hospital. - PT/OT recs: CIR - Fall precautions Disorientation Patient oriented to self and  hospital, but not city, year.  This appears to be his baseline.  A&O - Continue benztropine 0.5 mg daily at bedtime - Continue Depakote 1000 mg every 24 hours at bedtime - Continue phenelzine 30 mg twice daily Persistent atrial fibrillation (HCC) Rates continue to be controlled. - Eliquis currently being held, will resume on 10/9 per neurosurgery. Type 2 diabetes mellitus without complication, without long-term current use of insulin (HCC) Last A1c 6.4.  Home regimen includes Ozempic 1 mg weekly.  Discontinued sliding scale/CBGs.  - Can restart Ozempic at SNF. - CBGs and MSSI discontinued to limit procedures. Patient BGs relatively well controlled. A1c within acceptable limits on home ozempic monotherapy, will continue on discharge.   Chronic and Stable Issues:  Tremor: Bilateral upper and lower extremity.  Neurology recommended outpatient follow-up. Leukocytosis: 11.4 as of 10/7.  Likely acute phase reactant ISO neurosurgery.  No signs of infection otherwise.  Can recheck CBC if concerns. Gout: Started allopurinol 10/9  FEN/GI: Regular PPx: SCDs Dispo: SNF  Subjective:   No acute events overnight.  Patient lying in bed, snoring loudly, but wakes to voice.  On interview, he says that he is doing fine.  No new complaints.  Is walking around okay.  He is excited to go to a SNF near his family.  Objective:  BP: 127/80 HR: 97 RR: 18 T: Afebrile O2sat: 94% on room air  Significant vitals over past 24 hours:   Physical Exam:  General: Well-appearing elderly male, NAD, attentive to interview. Cardiovascular: Regular rate no m/r/g. Respiratory: CTAB. No w/r/r.  Abdomen: No tenderness in 4 quadrants. BS+.  Extremities: ROM intact.  Nonedematous. Neuro: Alert and oriented to self, situation, Clinical research associate.  Basic labs:  Most recent CBC Lab Results  Component Value Date   WBC 10.2 04/12/2023   HGB 13.7 04/12/2023   HCT 41.4 04/12/2023   MCV 90.4 04/12/2023   PLT 143 (L) 04/12/2023    Most recent BMP    Latest Ref Rng & Units 04/12/2023    8:16 AM  BMP  Glucose 70 - 99 mg/dL 94   BUN 8 - 23 mg/dL 16   Creatinine 6.29 - 1.24 mg/dL 5.28   Sodium 413 - 244 mmol/L 136   Potassium 3.5 - 5.1 mmol/L 4.0   Chloride 98 - 111 mmol/L 97   CO2 22 - 32 mmol/L 27   Calcium 8.9 - 10.3 mg/dL 8.5     Other pertinent labs:  No new labs.  Imaging/Diagnostic Tests:  No new imaging.  Tomie China, MD 04/15/2023, 7:33 AM  PGY-1, [facility] Family [facility] FPTS Intern pager: [phone], text pages welcome Secure chat group [facility] [employer] Teaching Service

## 2023-04-16 DIAGNOSIS — S12401D Unspecified nondisplaced fracture of fifth cervical vertebra, subsequent encounter for fracture with routine healing: Secondary | ICD-10-CM | POA: Diagnosis not present

## 2023-04-16 DIAGNOSIS — Z7985 Long-term (current) use of injectable non-insulin antidiabetic drugs: Secondary | ICD-10-CM

## 2023-04-16 DIAGNOSIS — R296 Repeated falls: Secondary | ICD-10-CM | POA: Diagnosis not present

## 2023-04-16 MED ORDER — PHENELZINE SULFATE 15 MG PO TABS: 30.0000 mg | ORAL_TABLET | Freq: Two times a day (BID) | ORAL | Status: DC

## 2023-04-16 MED ORDER — LIDOCAINE 5 % EX PTCH: 1.0000 | MEDICATED_PATCH | CUTANEOUS | Status: DC

## 2023-04-16 MED ORDER — CALCIUM CARBONATE ANTACID 500 MG PO CHEW: 1.0000 | CHEWABLE_TABLET | Freq: Every day | ORAL | Status: DC | PRN

## 2023-04-16 MED ORDER — PHENOL 1.4 % MT LIQD: 1.0000 | OROMUCOSAL | Status: DC | PRN

## 2023-04-16 MED ORDER — BENZTROPINE MESYLATE 0.5 MG PO TABS: 0.5000 mg | ORAL_TABLET | Freq: Every day | ORAL | Status: DC

## 2023-04-16 MED ORDER — SENNOSIDES-DOCUSATE SODIUM 8.6-50 MG PO TABS: 1.0000 | ORAL_TABLET | Freq: Every day | ORAL | Status: DC

## 2023-04-16 MED ORDER — PERPHENAZINE 16 MG PO TABS: 16.0000 mg | ORAL_TABLET | Freq: Every day | ORAL | Status: DC

## 2023-04-16 MED ORDER — POLYETHYLENE GLYCOL 3350 17 G PO PACK: 34.0000 g | PACK | Freq: Every day | ORAL | Status: DC

## 2023-04-16 MED ORDER — DIVALPROEX SODIUM ER 500 MG PO TB24: 1000.0000 mg | ORAL_TABLET | Freq: Every day | ORAL | Status: DC

## 2023-04-16 MED ORDER — LEVOTHYROXINE SODIUM 125 MCG PO TABS: 125.0000 ug | ORAL_TABLET | Freq: Every day | ORAL | Status: DC

## 2023-04-16 NOTE — Plan of Care (Signed)
  Problem: Education: Goal: Ability to describe self-care measures that may prevent or decrease complications (Diabetes Survival Skills Education) will improve Outcome: Progressing Goal: Individualized Educational Video(s) Outcome: Progressing   Problem: Coping: Goal: Ability to adjust to condition or change in health will improve Outcome: Progressing   Problem: Fluid Volume: Goal: Ability to maintain a balanced intake and output will improve Outcome: Progressing   Problem: Health Behavior/Discharge Planning: Goal: Ability to identify and utilize available resources and services will improve Outcome: Progressing Goal: Ability to manage health-related needs will improve Outcome: Progressing   Problem: Metabolic: Goal: Ability to maintain appropriate glucose levels will improve Outcome: Progressing   Problem: Nutritional: Goal: Maintenance of adequate nutrition will improve Outcome: Progressing Goal: Progress toward achieving an optimal weight will improve Outcome: Progressing   Problem: Skin Integrity: Goal: Risk for impaired skin integrity will decrease Outcome: Progressing   Problem: Tissue Perfusion: Goal: Adequacy of tissue perfusion will improve Outcome: Progressing   Problem: Education: Goal: Knowledge of General Education information will improve Description: Including pain rating scale, medication(s)/side effects and non-pharmacologic comfort measures Outcome: Progressing   Problem: Health Behavior/Discharge Planning: Goal: Ability to manage health-related needs will improve Outcome: Progressing   Problem: Clinical Measurements: Goal: Ability to maintain clinical measurements within normal limits will improve Outcome: Progressing Goal: Will remain free from infection Outcome: Progressing Goal: Diagnostic test results will improve Outcome: Progressing Goal: Respiratory complications will improve Outcome: Progressing Goal: Cardiovascular complication will  be avoided Outcome: Progressing   Problem: Activity: Goal: Risk for activity intolerance will decrease Outcome: Progressing   Problem: Nutrition: Goal: Adequate nutrition will be maintained Outcome: Progressing   Problem: Coping: Goal: Level of anxiety will decrease Outcome: Progressing   Problem: Elimination: Goal: Will not experience complications related to bowel motility Outcome: Progressing Goal: Will not experience complications related to urinary retention Outcome: Progressing   Problem: Pain Managment: Goal: General experience of comfort will improve Outcome: Progressing   Problem: Safety: Goal: Ability to remain free from injury will improve Outcome: Progressing   Problem: Skin Integrity: Goal: Risk for impaired skin integrity will decrease Outcome: Progressing   Problem: Education: Goal: Ability to verbalize activity precautions or restrictions will improve Outcome: Progressing Goal: Knowledge of the prescribed therapeutic regimen will improve Outcome: Progressing Goal: Understanding of discharge needs will improve Outcome: Progressing   Problem: Activity: Goal: Ability to avoid complications of mobility impairment will improve Outcome: Progressing Goal: Ability to tolerate increased activity will improve Outcome: Progressing Goal: Will remain free from falls Outcome: Progressing   Problem: Bowel/Gastric: Goal: Gastrointestinal status for postoperative course will improve Outcome: Progressing   Problem: Clinical Measurements: Goal: Ability to maintain clinical measurements within normal limits will improve Outcome: Progressing Goal: Postoperative complications will be avoided or minimized Outcome: Progressing Goal: Diagnostic test results will improve Outcome: Progressing   Problem: Pain Management: Goal: Pain level will decrease Outcome: Progressing   Problem: Skin Integrity: Goal: Will show signs of wound healing Outcome: Progressing    Problem: Health Behavior/Discharge Planning: Goal: Identification of resources available to assist in meeting health care needs will improve Outcome: Progressing   Problem: Bladder/Genitourinary: Goal: Urinary functional status for postoperative course will improve Outcome: Progressing   

## 2023-04-16 NOTE — Progress Notes (Signed)
Physical Therapy Treatment  Patient Details Name: [name] MRN: [id number] DOB: 03-Jul-1961 Today's Date: 04/16/2023   History of Present Illness Pt is a 62 y.o. male presenting via [facility]AR on 9/28 after recurrent falls. MRI cervical spine revealed acute fx extending through a bridging osteophyte at C5-6,  with extension through the underlying superior endplate of C6.  Subtle widening of the C5-6 interspace. Overall, constellation of  findings are consistent with an acute hyperextension injury. Acute anterior longitudinal ligament injury. S/p ACDF C5-6 10/3. PMH significant for significant for seizures, hypothyroidism, A-fib anticoagulated with Eliquis, DM, MDD, Anxiety and distant dx of schizoaffective d/o and OCD    PT Comments  Pt progressing towards physical therapy goals. Pt sleeping upon entry and appears groggy throughout session, requiring increased assist and balance support throughout functional mobility. Overall continues to have difficulty maintaining cervical precautions and requires cueing throughout session for safety. Pt anticipates d/c to SNF rehab this afternoon. Will continue to follow and progress as able per POC.     If plan is discharge home, recommend the following: A lot of help with walking and/or transfers;A lot of help with bathing/dressing/bathroom;Assistance with cooking/housework;Direct supervision/assist for financial management;Direct supervision/assist for medications management;Assist for transportation;Help with stairs or ramp for entrance;Supervision due to cognitive status   Can travel by private vehicle        Equipment Recommendations  Rolling walker (2 wheels);BSC/3in1    Recommendations for Other Services Rehab consult     Precautions / Restrictions Precautions Precautions: Fall;Cervical Precaution Booklet Issued: No Precaution Comments: tremors Required Braces or Orthoses: Cervical Brace Cervical Brace: Hard collar;Other (comment) (May remove  when in bed, apply/remove in sitting, may remove to shower, may NOT ambulate to bathroom without brace) Restrictions Weight Bearing Restrictions: No     Mobility  Bed Mobility Overal bed mobility: Needs Assistance Bed Mobility: Rolling, Sidelying to Sit Rolling: Min assist Sidelying to sit: HOB elevated, Mod assist       General bed mobility comments: VC's for log roll technique. Pt able to roll to sidelying with min assist and use of rail but required mod assist for trunk elevation to full sitting position.    Transfers Overall transfer level: Needs assistance Equipment used: Rolling walker (2 wheels) Transfers: Sit to/from Stand Sit to Stand: Min assist           General transfer comment: VC's for hand placement on seated surface for safety. Min assist for power up to full stand and to gain/maintain standing balance.    Ambulation/Gait Ambulation/Gait assistance: Min assist Gait Distance (Feet): 75 Feet Assistive device: Rolling walker (2 wheels) Gait Pattern/deviations: Decreased step length - left, Decreased stride length, Decreased dorsiflexion - right, Decreased dorsiflexion - left, Knee flexed in stance - right, Knee flexed in stance - left, Shuffle, Narrow base of support, Decreased step length - right, Step-through pattern Gait velocity: Decreased Gait velocity interpretation: 1.31 - 2.62 ft/sec, indicative of limited community ambulator   General Gait Details: Slow and more unsteady this session, requiring frequent min assist for balance support and walker management. Pt with several posterior LOB.   Stairs             Wheelchair Mobility     Tilt Bed    Modified Rankin (Stroke Patients Only)       Balance Overall balance assessment: Needs assistance Sitting-balance support: Single extremity supported, Bilateral upper extremity supported, Feet supported Sitting balance-Leahy Scale: Fair     Standing balance support: Bilateral upper extremity  supported, During functional activity, Reliant on assistive device for balance Standing balance-Leahy Scale: Poor Standing balance comment: In static standing, pt with posterior losses of balance and requires assist to recover.                            Cognition Arousal: Alert Behavior During Therapy: WFL for tasks assessed/performed Overall Cognitive Status: No family/caregiver present to determine baseline cognitive functioning                                 General Comments: Poor recall/maintenance of back precautions        Exercises      General Comments        Pertinent Vitals/Pain Pain Assessment Pain Assessment: Faces Faces Pain Scale: No hurt Pain Intervention(s): Monitored during session    Home Living Family/patient expects to be discharged to:: Private residence Living Arrangements: Alone Available Help at Discharge: Family (mother) Type of Home: Apartment             Additional Comments: mother lives in a house alone in [place]. no family present to confirm.    Prior Function            PT Goals (current goals can now be found in the care plan section) Acute Rehab PT Goals Patient Stated Goal: to improve PT Goal Formulation: With patient Time For Goal Achievement: 04/23/23 Potential to Achieve Goals: Good Progress towards PT goals: Progressing toward goals    Frequency    Min 1X/week      PT Plan      Co-evaluation              AM-PAC PT "6 Clicks" Mobility   Outcome Measure  Help needed turning from your back to your side while in a flat bed without using bedrails?: A Little Help needed moving from lying on your back to sitting on the side of a flat bed without using bedrails?: A Lot Help needed moving to and from a bed to a chair (including a wheelchair)?: A Little Help needed standing up from a chair using your arms (e.g., wheelchair or bedside chair)?: A Little Help needed to walk in  hospital room?: A Little Help needed climbing 3-5 steps with a railing? : A Lot 6 Click Score: 16    End of Session Equipment Utilized During Treatment: Gait belt;Cervical collar Activity Tolerance: Patient tolerated treatment well Patient left: in chair;with call bell/phone within reach;with chair alarm set Nurse Communication: Mobility status PT Visit Diagnosis: Unsteadiness on feet (R26.81);Other abnormalities of gait and mobility (R26.89);Muscle weakness (generalized) (M62.81);History of falling (Z91.81);Repeated falls (R29.6);Difficulty in walking, not elsewhere classified (R26.2)     Time: 0272-5366 PT Time Calculation (min) (ACUTE ONLY): 29 min  Charges:    $Gait Training: 23-37 mins PT General Charges $$ ACUTE PT VISIT: 1 Visit                     Conni Slipper, PT, DPT Acute Rehabilitation Services Secure Chat Preferred Office: [phone]    Marylynn Pearson 04/16/2023, 3:12 PM

## 2023-04-16 NOTE — Assessment & Plan Note (Signed)
SNF [facility] health and rehab in [place] has offered him a bed.  We expect discharge today.  Per PT/OT notes, patient is progressing towards goals while in the hospital. - PT/OT recs: CIR - Fall precautions

## 2023-04-16 NOTE — Progress Notes (Signed)
Daily Progress Note Intern Pager: [phone]  Patient name: [name] Medical record number: [id number] Date of birth: 10-07-1960 Age: 62 y.o. Gender: male  Primary Care Provider: Tiffany Kocher, DO Consultants: Neurosurgery Code Status: Full  Pt Overview and Major Events to Date:   Kylyn Sookram is a 62 year old male with profound dementia (A&O to self, family) admitted s/p repeated falls at home and found to have a C5 fracture, status post stabilization with neurosurgery 10/3.  Likely discharge to SNF today.   9/28: Admitted for AMS.  C5 fracture found on imaging.  C-spine collar placed. 9/29: Psych consulted, decreased his home psych medications to reduce anticholinergic side effects. 9/30: Patient mentation improved.  Begun on heparin 5000 units every 8 hours. 10/1: Reversible causes of dementia (B12, RPR).  B12 negative. 10/3- Operation with neurosurgery: ACDF C5-C6 and overnight observation in neuro-ICU 10/4- Psychiatry signed off 10/5- FMTS resumed care on med-tele floor Assessment & Plan C5 cervical fracture (HCC) Patient at baseline today.  Pain well-controlled. Medically cleared for discharge. Stable. POD # 8 ACDF (possibly some concern for esophageal injury intraoperatively).  Pulled Hemovac 10/6.  [place] J collar still in place.  Pain well-controlled. - Per neurosurgery, [place] J collar when out of bed.  Reached out to neurosurgery, confirmed that he will need to wear collar until follow-up visit, yet to be scheduled.  - Pain regimen: Tylenol 650 mg every 6 hours - Began Eliquis Wednesday 10/9, per neurosurgery note - Use incentive spirometry every hour - Vital signs with neurochecks every 4 hours - Outpatient neurosurgery follow-up in 3 weeks Multiple falls SNF [facility] health and rehab in [place] has offered him a bed.  We expect discharge today.  Per PT/OT notes, patient is progressing towards goals while in the hospital. - PT/OT recs: CIR - Fall  precautions Disorientation Patient oriented to self and hospital, but not city, year.  This appears to be his baseline.  A&O - Continue benztropine 0.5 mg daily at bedtime - Continue Depakote 1000 mg every 24 hours at bedtime - Continue phenelzine 30 mg twice daily Persistent atrial fibrillation (HCC) Rates continue to be controlled. - Eliquis currently being held, will resume on 10/9 per neurosurgery. Type 2 diabetes mellitus without complication, without long-term current use of insulin (HCC) Last A1c 6.4.  Home regimen includes Ozempic 1 mg weekly.  Discontinued sliding scale/CBGs.  - Can restart Ozempic at SNF. - CBGs and MSSI discontinued to limit procedures. Patient BGs relatively well controlled. A1c within acceptable limits on home ozempic monotherapy, will continue on discharge.   Chronic and Stable Issues:  Tremor: Bilateral upper and lower extremity.  Neurology recommended outpatient follow-up. Leukocytosis: 11.4 as of 10/7.  Likely acute phase reactant ISO neurosurgery.  No signs of infection otherwise.  Can recheck CBC if concerns. Gout: Started allopurinol 10/9  FEN/GI: Regular PPx: SCDs Dispo: SNF, likely today  Subjective:   No acute events overnight.  On interview, patient reports good sleep, eating well, well-controlled back pain.  He is ready to go.  He asked if his mom would meet him there.   Objective:  BP: 119/90 HR: 79 RR: 18 T: 98.3 O2sat: 94% on room air  Significant vitals over past 24 hours: None.  Physical Exam:  General: Well-appearing elderly male, NAD, attentive to interview.   Cardiovascular: Regular rate no m/r/g. Respiratory: CTAB. No w/r/r.  Abdomen: No tenderness in 4 quadrants. BS+.  Extremities: ROM intact.  Nonedematous. Neuro: Alert and oriented to self, situation, Clinical research associate.  Basic labs:  Most recent CBC Lab Results  Component Value Date   WBC 10.2 04/12/2023   HGB 13.7 04/12/2023   HCT 41.4 04/12/2023   MCV 90.4 04/12/2023    PLT 143 (L) 04/12/2023   Most recent BMP    Latest Ref Rng & Units 04/12/2023    8:16 AM  BMP  Glucose 70 - 99 mg/dL 94   BUN 8 - 23 mg/dL 16   Creatinine 1.61 - 1.24 mg/dL 0.96   Sodium 045 - 409 mmol/L 136   Potassium 3.5 - 5.1 mmol/L 4.0   Chloride 98 - 111 mmol/L 97   CO2 22 - 32 mmol/L 27   Calcium 8.9 - 10.3 mg/dL 8.5     Other pertinent labs:  No new labs.  Imaging/Diagnostic Tests:  No new imaging.  Tomie China, MD 04/16/2023, 7:24 AM  PGY-1, [facility] Family Medicine FPTS Intern pager: [phone], text pages welcome Secure chat group [facility] Teaching Service

## 2023-04-16 NOTE — Assessment & Plan Note (Addendum)
Patient at baseline today.  Pain well-controlled. Medically cleared for discharge. Stable. POD # 8 ACDF (possibly some concern for esophageal injury intraoperatively).  Pulled Hemovac 10/6.  Mi[place] J collar still in place.  Pain well-controlled. - Per neurosurgery, [place] J collar when out of bed.  Reached out to neurosurgery, confirmed that he will need to wear collar until follow-up visit, yet to be scheduled.  - Pain regimen: Tylenol 650 mg every 6 hours - Began Eliquis Wednesday 10/9, per neurosurgery note - Use incentive spirometry every hour - Vital signs with neurochecks every 4 hours - Outpatient neurosurgery follow-up in 3 weeks

## 2023-04-16 NOTE — TOC Progression Note (Signed)
Transition of Care [facility]) - Progression Note    Patient Details  Name: [name] MRN: [id number] Date of Birth: 01/08/1961  Transition of Care [facility]) CM/SW Contact  Baldemar Lenis, [place] Phone Number: 04/16/2023, 4:07 PM  Clinical Narrative:   CSW received voicemail after hours from patient's mom, Annabelle Harman, that she reviewed [facility] but they did not have good reviews so she was hoping for another facility. CSW discussed with her to go tour the facility and see it in person because online reviews can be skewed towards the negative. Dana in agreement. CSW also received call from [facility], they can offer a bed for the patient. CSW updated Annabelle Harman, she will go tour both places. CSW spoke with Annabelle Harman afterwards, she would like to choose [facility].  CSW spoke with Admissions in [place] to check on auth status, they have not initiated authorization. CSW obtained NPI number and asked CMA to try again. Authorization unable to be initiated due to invalid NPI number. CSW contacted [facility], they will initiate authorization. Authorization pending for SNF at this time.    Expected Discharge Plan: Skilled Nursing Facility Barriers to Discharge: Continued Medical Work up  Expected Discharge Plan and Services   Discharge Planning Services: CM Consult   Living arrangements for the past 2 months: Apartment                                       Social Determinants of Health (SDOH) Interventions SDOH Screenings   Food Insecurity: No Food Insecurity (04/04/2023)  Housing: Low Risk  (04/04/2023)  Transportation Needs: No Transportation Needs (04/04/2023)  Utilities: Not At Risk (04/04/2023)  Alcohol Screen: Low Risk  (02/13/2023)  Depression (PHQ2-9): Low Risk  (02/19/2023)  Recent Concern: Depression (PHQ2-9) - Medium Risk (12/21/2022)  Financial Resource Strain: Low Risk  (02/13/2023)  Physical Activity: Inactive (02/13/2023)  Social Connections: Socially Isolated (02/13/2023)  Stress:  No Stress Concern Present (02/13/2023)  Tobacco Use: Low Risk  (04/08/2023)  Health Literacy: Adequate Health Literacy (02/13/2023)    Readmission Risk Interventions     No data to display

## 2023-04-16 NOTE — Assessment & Plan Note (Signed)
Last A1c 6.4.  Home regimen includes Ozempic 1 mg weekly.  Discontinued sliding scale/CBGs.  - Can restart Ozempic at SNF. - CBGs and MSSI discontinued to limit procedures. Patient BGs relatively well controlled. A1c within acceptable limits on home ozempic monotherapy, will continue on discharge.

## 2023-04-16 NOTE — Assessment & Plan Note (Signed)
Patient oriented to self and hospital, but not city, year.  This appears to be his baseline.  A&O - Continue benztropine 0.5 mg daily at bedtime - Continue Depakote 1000 mg every 24 hours at bedtime - Continue phenelzine 30 mg twice daily

## 2023-04-16 NOTE — Assessment & Plan Note (Signed)
Rates continue to be controlled. - Eliquis currently being held, will resume on 10/9 per neurosurgery.

## 2023-04-17 DIAGNOSIS — Z7985 Long-term (current) use of injectable non-insulin antidiabetic drugs: Secondary | ICD-10-CM | POA: Diagnosis not present

## 2023-04-17 DIAGNOSIS — S12491A Other nondisplaced fracture of fifth cervical vertebra, initial encounter for closed fracture: Secondary | ICD-10-CM | POA: Diagnosis not present

## 2023-04-17 NOTE — Assessment & Plan Note (Signed)
Pain well-controlled. Stable. POD #9 ACDF (possibly some concern for esophageal injury intraoperatively).  Pulled Hemovac 10/6.  [place] J collar still in place, will need to wear until follow-up visit. - Pain regimen: Tylenol 650 mg every 6 hours - Use incentive spirometry every hour - Vital signs with neurochecks every 4 hours - Outpatient neurosurgery follow-up in 3 weeks

## 2023-04-17 NOTE — Assessment & Plan Note (Addendum)
SNF [facility] health and rehab in [place] has offered him a bed. Per PT/OT notes, patient is progressing towards goals while in the hospital. - Fall precautions

## 2023-04-17 NOTE — Assessment & Plan Note (Addendum)
Patient oriented to self and hospital, but not city, year.  This appears to be his baseline.  A&O - Continue benztropine 0.5 mg daily at bedtime - Continue Depakote 1000 mg every 24 hours at bedtime - Continue phenelzine 30 mg twice daily

## 2023-04-17 NOTE — Plan of Care (Signed)
  Problem: Coping: Goal: Ability to adjust to condition or change in health will improve Outcome: Progressing   Problem: Fluid Volume: Goal: Ability to maintain a balanced intake and output will improve Outcome: Progressing   Problem: Health Behavior/Discharge Planning: Goal: Ability to identify and utilize available resources and services will improve Outcome: Progressing Goal: Ability to manage health-related needs will improve Outcome: Progressing   Problem: Metabolic: Goal: Ability to maintain appropriate glucose levels will improve Outcome: Progressing   Problem: Nutritional: Goal: Maintenance of adequate nutrition will improve Outcome: Progressing Goal: Progress toward achieving an optimal weight will improve Outcome: Progressing   Problem: Skin Integrity: Goal: Risk for impaired skin integrity will decrease Outcome: Progressing   Problem: Tissue Perfusion: Goal: Adequacy of tissue perfusion will improve Outcome: Progressing   Problem: Education: Goal: Knowledge of General Education information will improve Description: Including pain rating scale, medication(s)/side effects and non-pharmacologic comfort measures Outcome: Progressing   Problem: Health Behavior/Discharge Planning: Goal: Ability to manage health-related needs will improve Outcome: Progressing   Problem: Clinical Measurements: Goal: Ability to maintain clinical measurements within normal limits will improve Outcome: Progressing Goal: Will remain free from infection Outcome: Progressing Goal: Diagnostic test results will improve Outcome: Progressing Goal: Respiratory complications will improve Outcome: Progressing Goal: Cardiovascular complication will be avoided Outcome: Progressing   Problem: Activity: Goal: Risk for activity intolerance will decrease Outcome: Progressing   Problem: Nutrition: Goal: Adequate nutrition will be maintained Outcome: Progressing   Problem: Coping: Goal:  Level of anxiety will decrease Outcome: Progressing   Problem: Elimination: Goal: Will not experience complications related to bowel motility Outcome: Progressing Goal: Will not experience complications related to urinary retention Outcome: Progressing   Problem: Pain Managment: Goal: General experience of comfort will improve Outcome: Progressing   Problem: Safety: Goal: Ability to remain free from injury will improve Outcome: Progressing   Problem: Skin Integrity: Goal: Risk for impaired skin integrity will decrease Outcome: Progressing   Problem: Education: Goal: Ability to verbalize activity precautions or restrictions will improve Outcome: Progressing Goal: Knowledge of the prescribed therapeutic regimen will improve Outcome: Progressing Goal: Understanding of discharge needs will improve Outcome: Progressing   Problem: Activity: Goal: Ability to avoid complications of mobility impairment will improve Outcome: Progressing Goal: Ability to tolerate increased activity will improve Outcome: Progressing Goal: Will remain free from falls Outcome: Progressing   Problem: Bowel/Gastric: Goal: Gastrointestinal status for postoperative course will improve Outcome: Progressing   Problem: Clinical Measurements: Goal: Ability to maintain clinical measurements within normal limits will improve Outcome: Progressing Goal: Postoperative complications will be avoided or minimized Outcome: Progressing Goal: Diagnostic test results will improve Outcome: Progressing   Problem: Pain Management: Goal: Pain level will decrease Outcome: Progressing   Problem: Skin Integrity: Goal: Will show signs of wound healing Outcome: Progressing   Problem: Health Behavior/Discharge Planning: Goal: Identification of resources available to assist in meeting health care needs will improve Outcome: Progressing   Problem: Bladder/Genitourinary: Goal: Urinary functional status for  postoperative course will improve Outcome: Progressing

## 2023-04-17 NOTE — Assessment & Plan Note (Deleted)
Patient oriented to self and hospital, but not city, year.  *** This appears to be his baseline.  A&O - Continue benztropine 0.5 mg daily at bedtime - Continue Depakote 1000 mg every 24 hours at bedtime - Continue phenelzine 30 mg twice daily

## 2023-04-17 NOTE — Progress Notes (Signed)
Daily Progress Note Intern Pager: [phone]  Patient name: [name] Medical record number: [id number] Date of birth: February 03, 1961 Age: 62 y.o. Gender: male  Primary Care Provider: Tiffany Kocher, DO Consultants: Neurosurgery Code Status: Full  Pt Overview and Major Events to Date:  9/28: Admitted for AMS.  C5 fracture found on imaging.  C-spine collar placed. 9/29: Psych consulted, decreased his home psych medications to reduce anticholinergic side effects. 9/30: Patient mentation improved.  Begun on heparin 5000 units every 8 hours. 10/1: Reversible causes of dementia (B12, RPR).  B12 negative. 10/3- Operation with neurosurgery: ACDF C5-C6 and overnight observation in neuro-ICU 10/4- Psychiatry signed off 10/5- FMTS resumed care on med-tele floor  Assessment and Plan: [name] is a 62 year old male with profound dementia (A&O to self, family) admitted s/p repeated falls at home and found to have a C5 fracture, status post stabilization with neurosurgery 10/3.  Medically stable for discharge. Assessment & Plan C5 cervical fracture (HCC) Pain well-controlled. Stable. POD #9 ACDF (possibly some concern for esophageal injury intraoperatively).  Pulled Hemovac 10/6.  [place] J collar still in place, will need to wear until follow-up visit. - Pain regimen: Tylenol 650 mg every 6 hours - Use incentive spirometry every hour - Vital signs with neurochecks every 4 hours - Outpatient neurosurgery follow-up in 3 weeks Multiple falls SNF [facility] and rehab in [place] has offered him a bed. Per PT/OT notes, patient is progressing towards goals while in the hospital. - Fall precautions Disorientation Patient oriented to self and hospital, but not city, year.  This appears to be his baseline.  A&O - Continue benztropine 0.5 mg daily at bedtime - Continue Depakote 1000 mg every 24 hours at bedtime - Continue phenelzine 30 mg twice daily  Chronic and Stable  Issues: Tremor: Bilateral upper and lower extremity.  Neurology recommended outpatient follow-up. Gout: Allopurinol 300 mg daily A-fib: Eliquis 5 mg twice daily (restarted 10/9) T2DM: Well-controlled, restart Ozempic at SNF. Hypothyroidism: Synthroid 225 mcg daily HLD: Atorvastatin 40 mg daily  FEN/GI: Regular PPx: Eliquis Dispo:SNF ([facility]). Barriers include insurance authorization.   Subjective:  States he is doing well this morning, asked for assistance of laying his bed back.  Denies any pain at this time and is understanding of plan of progressing to SNF.  Does not have any other complaints or requests this morning.  Objective: Temp:  [97.6 F (36.4 C)-98 F (36.7 C)] 98 F (36.7 C) (10/12 0346) Pulse Rate:  [76-101] 97 (10/12 0346) Resp:  [18-19] 18 (10/12 0346) BP: (110-154)/(78-97) 135/97 (10/12 0346) SpO2:  [93 %-94 %] 94 % (10/12 0346) Physical Exam: General: Well-appearing, NAD, c-collar in place Cardiovascular: Irregularly irregular, no murmurs appreciated Respiratory: CTAB, normal WOB  Laboratory: Most recent CBC Lab Results  Component Value Date   WBC 10.2 04/12/2023   HGB 13.7 04/12/2023   HCT 41.4 04/12/2023   MCV 90.4 04/12/2023   PLT 143 (L) 04/12/2023   Most recent BMP    Latest Ref Rng & Units 04/12/2023    8:16 AM  BMP  Glucose 70 - 99 mg/dL 94   BUN 8 - 23 mg/dL 16   Creatinine 1.30 - 1.24 mg/dL 8.65   Sodium 784 - 696 mmol/L 136   Potassium 3.5 - 5.1 mmol/L 4.0   Chloride 98 - 111 mmol/L 97   CO2 22 - 32 mmol/L 27   Calcium 8.9 - 10.3 mg/dL 8.5    Imaging/Diagnostic Tests: No recent imaging results.  Shelby Mattocks, DO 04/17/2023, 6:24 AM PGY-3, [place] [facility] FPTS Intern pager: [phone], text pages welcome Secure chat group [facility] Teaching Service

## 2023-04-17 NOTE — Assessment & Plan Note (Deleted)
Pain well-controlled. Stable. POD #9 ACDF (possibly some concern for esophageal injury intraoperatively).  Pulled Hemovac 10/6.  [place] J collar still in place, will need to wear until follow-up visit. - Pain regimen: Tylenol 650 mg every 6 hours - Use incentive spirometry every hour - Vital signs with neurochecks every 4 hours - Outpatient neurosurgery follow-up in 3 weeks

## 2023-04-17 NOTE — Assessment & Plan Note (Deleted)
SNF [facility] and rehab in [place] has offered him a bed. Per PT/OT notes, patient is progressing towards goals while in the hospital. - Can discharge when accepted. - Fall precautions

## 2023-04-18 DIAGNOSIS — S12491A Other nondisplaced fracture of fifth cervical vertebra, initial encounter for closed fracture: Secondary | ICD-10-CM | POA: Diagnosis not present

## 2023-04-18 DIAGNOSIS — Z7985 Long-term (current) use of injectable non-insulin antidiabetic drugs: Secondary | ICD-10-CM | POA: Diagnosis not present

## 2023-04-18 DIAGNOSIS — R296 Repeated falls: Secondary | ICD-10-CM | POA: Diagnosis not present

## 2023-04-18 NOTE — Assessment & Plan Note (Signed)
SNF [facility] health and rehab in [place] has offered him a bed. Per PT/OT notes, patient is progressing towards goals while in the hospital. - Fall precautions

## 2023-04-18 NOTE — Progress Notes (Signed)
Daily Progress Note Intern Pager: [phone]  Patient name: [name] Medical record number: [id number] Date of birth: [birth year] Age: 62 y.o. Gender: male  Primary Care Provider: Tiffany Kocher, DO Consultants: Neurosurgery Code Status: FULL  Pt Overview and Major Events to Date:  9/28: Admitted for AMS.  C5 fracture found on imaging.  C-spine collar placed. 9/29: Psych consulted, decreased his home psych medications to reduce anticholinergic side effects. 9/30: Patient mentation improved.  Begun on heparin 5000 units every 8 hours. 10/1: Reversible causes of dementia (B12, RPR).  B12 negative. 10/3- Operation with neurosurgery: ACDF C5-C6 and overnight observation in neuro-ICU 10/4- Psychiatry signed off 10/5- FMTS resumed care on med-tele floor  Assessment and Plan: [name] is a 62 year old male with profound dementia (A&O to self, family) admitted s/p repeated falls at home and found to have a C5 fracture, status post stabilization with neurosurgery 10/3.  Medically stable for discharge.  Assessment & Plan C5 cervical fracture (HCC) Stable. Pain remains well-controlled. POD #10 ACDF (possibly some concern for esophageal injury intraoperatively).  Pulled Hemovac 10/6.  [place] J collar still in place, will need to wear until follow-up visit. - Pain regimen: Tylenol 650 mg every 6 hours - Use incentive spirometry every hour - Vital signs with neurochecks every 4 hours - Outpatient neurosurgery follow-up in 3 weeks Multiple falls SNF [facility] re[facility] in [place] has offered him a bed. Per PT/OT notes, patient is progressing towards goals while in the hospital. - Fall precautions Disorientation Patient oriented to self and hospital, but not city, year.  This appears to be his baseline.  A&O - Continue benztropine 0.5 mg daily at bedtime - Continue Depakote 1000 mg every 24 hours at bedtime - Continue phenelzine 30 mg twice daily   Chronic and Stable  Problems:  Tremor: Bilateral upper and lower extremity.  Neurology recommended outpatient follow-up. Gout: Allopurinol 300 mg daily A-fib: Eliquis 5 mg twice daily (restarted 10/9) T2DM: Well-controlled, restart Ozempic at SNF. Hypothyroidism: Synthroid 225 mcg daily HLD: Atorvastatin 40 mg daily   FEN/GI: Regular PPx: Eliquis Dispo: SNF ([facility]), pending insurance authorization.    Subjective:  Patient has no acute concerns, he is not in pain.  Objective: Temp:  [97.7 F (36.5 C)-98.7 F (37.1 C)] 97.7 F (36.5 C) (10/12 2346) Pulse Rate:  [82-97] 82 (10/12 2346) Resp:  [17-19] 18 (10/12 1954) BP: (126-139)/(79-98) 126/98 (10/12 2346) SpO2:  [93 %-97 %] 93 % (10/12 2346) Physical Exam: General: NAD, resting in bed, chronically ill-appearing, cervical collar in place Cardiovascular: Irregular rate and rhythm Respiratory: CTAB, normal WOB on RA Extremities: Moving all 4 extremities, no edema  Laboratory: Most recent CBC Lab Results  Component Value Date   WBC 10.2 04/12/2023   HGB 13.7 04/12/2023   HCT 41.4 04/12/2023   MCV 90.4 04/12/2023   PLT 143 (L) 04/12/2023   Most recent BMP    Latest Ref Rng & Units 04/12/2023    8:16 AM  BMP  Glucose 70 - 99 mg/dL 94   BUN 8 - 23 mg/dL 16   Creatinine 1.47 - 1.24 mg/dL 8.29   Sodium 562 - 130 mmol/L 136   Potassium 3.5 - 5.1 mmol/L 4.0   Chloride 98 - 111 mmol/L 97   CO2 22 - 32 mmol/L 27   Calcium 8.9 - 10.3 mg/dL 8.5     Tiffany Kocher, DO 04/18/2023, 12:08 AM  PGY-2,  Family Medicine FPTS Intern pager: [phone], text pages welcome  Secure chat group [facility] Teaching Service

## 2023-04-18 NOTE — Assessment & Plan Note (Signed)
Patient oriented to self and hospital, but not city, year.  This appears to be his baseline.  A&O - Continue benztropine 0.5 mg daily at bedtime - Continue Depakote 1000 mg every 24 hours at bedtime - Continue phenelzine 30 mg twice daily

## 2023-04-18 NOTE — Progress Notes (Signed)
Occupational Therapy Treatment Patient Details Name: [name] MRN: [id number] DOB: 31-Aug-1960 Today's Date: 04/18/2023   History of present illness Pt is a 62 y.o. male presenting via [facility] on 9/28 after recurrent falls. MRI cervical spine revealed acute fx extending through a bridging osteophyte at C5-6,  with extension through the underlying superior endplate of C6.  Subtle widening of the C5-6 interspace. Overall, constellation of  findings are consistent with an acute hyperextension injury. Acute anterior longitudinal ligament injury. S/p ACDF C5-6 10/3. PMH significant for significant for seizures, hypothyroidism, A-fib anticoagulated with Eliquis, DM, MDD, Anxiety and distant dx of schizoaffective d/o and OCD   OT comments  Pt progressing towards goals, still with decr awareness of cervical precautions, needs mod cues throughout. Pt provided with built up utensil to assist with self feeding due to bil UE tremors, pt able to demo and states it feels better to hold utensil with built up handle. Pt set up - max A for ADLs, during session, able to complete toilet transfer, max A for pericare in standing. Pt presenting with impairments listed below, will follow acutely. Patient will benefit from continued inpatient follow up therapy, <3 hours/day to maximize safety/ind with ADLs/functional mobility.       If plan is discharge home, recommend the following:  A little help with walking and/or transfers;A lot of help with bathing/dressing/bathroom;Assistance with cooking/housework;Direct supervision/assist for medications management;Direct supervision/assist for financial management;Assist for transportation;Supervision due to cognitive status;Help with stairs or ramp for entrance   Equipment Recommendations  BSC/3in1;Other (comment) (RW)    Recommendations for Other Services PT consult    Precautions / Restrictions Precautions Precautions: Fall;Cervical Precaution Booklet Issued: Yes  (comment) (has in room from prior session) Precaution Comments: tremors Required Braces or Orthoses: Cervical Brace Cervical Brace: Hard collar;Other (comment) (May remove when in bed, apply/remove in sitting, may remove to shower, may NOT ambulate to bathroom without brace) Restrictions Weight Bearing Restrictions: No       Mobility Bed Mobility               General bed mobility comments: OOB in chair upon arrival and departure    Transfers Overall transfer level: Needs assistance Equipment used: Rolling walker (2 wheels) Transfers: Sit to/from Stand Sit to Stand: Min assist           General transfer comment: cues for hand placement to push up vs pull on RW     Balance Overall balance assessment: Needs assistance Sitting-balance support: Single extremity supported, Bilateral upper extremity supported, Feet supported Sitting balance-Leahy Scale: Fair Sitting balance - Comments: UE support, CGA-minA for static sitting balance.   Standing balance support: Bilateral upper extremity supported, During functional activity, Reliant on assistive device for balance Standing balance-Leahy Scale: Poor Standing balance comment: In static standing, pt with posterior losses of balance and requires assist to recover.                           ADL either performed or assessed with clinical judgement   ADL Overall ADL's : Needs assistance/impaired Eating/Feeding: Set up Eating/Feeding Details (indicate cue type and reason): with built up utensil Grooming: Oral care;Set up;Standing                   Toilet Transfer: Contact guard assist;Ambulation;Regular Toilet   Toileting- Clothing Manipulation and Hygiene: Maximal assistance;Sitting/lateral lean       Functional mobility during ADLs: Contact guard assist;Rolling walker (2 wheels)  Extremity/Trunk Assessment Upper Extremity Assessment Upper Extremity Assessment: Right hand dominant RUE Deficits  / Details: tremor RUE Coordination: decreased gross motor;decreased fine motor LUE Deficits / Details: tremor LUE Coordination: decreased gross motor;decreased fine motor   Lower Extremity Assessment Lower Extremity Assessment: Generalized weakness        Vision   Vision Assessment?: No apparent visual deficits   Perception Perception Perception: Not tested   Praxis Praxis Praxis: Not tested    Cognition Arousal: Alert Behavior During Therapy: WFL for tasks assessed/performed Overall Cognitive Status: No family/caregiver present to determine baseline cognitive functioning                                 General Comments: decr recall of precautions, requires mod cues, overall slow to process and initiate tasks        Exercises      Shoulder Instructions       General Comments VSS    Pertinent Vitals/ Pain       Pain Assessment Pain Assessment: No/denies pain  Home Living                                          Prior Functioning/Environment              Frequency  Min 1X/week        Progress Toward Goals  OT Goals(current goals can now be found in the care plan section)  Progress towards OT goals: Progressing toward goals  Acute Rehab OT Goals Patient Stated Goal: none stated OT Goal Formulation: With patient Time For Goal Achievement: 04/23/23 Potential to Achieve Goals: Good ADL Goals Pt Will Perform Grooming: with set-up;sitting Pt Will Perform Upper Body Bathing: with set-up;sitting Pt Will Perform Lower Body Bathing: with set-up;sit to/from stand Pt Will Perform Upper Body Dressing: with set-up;sitting Pt Will Perform Lower Body Dressing: with mod assist;sit to/from stand Pt Will Transfer to Toilet: with min assist;ambulating;bedside commode Additional ADL Goal #1: pt will complete bed mobility CGA as precursor to adls. Additional ADL Goal #2: pt will demonstrate cervical precautions and verbalize no  lifting 100% accuracy  Plan      Co-evaluation                 AM-PAC OT "6 Clicks" Daily Activity     Outcome Measure   Help from another person eating meals?: A Little Help from another person taking care of personal grooming?: A Little Help from another person toileting, which includes using toliet, bedpan, or urinal?: A Lot Help from another person bathing (including washing, rinsing, drying)?: A Lot Help from another person to put on and taking off regular upper body clothing?: A Little Help from another person to put on and taking off regular lower body clothing?: A Lot 6 Click Score: 15    End of Session Equipment Utilized During Treatment: Gait belt;Rolling walker (2 wheels);Cervical collar  OT Visit Diagnosis: Unsteadiness on feet (R26.81);Muscle weakness (generalized) (M62.81)   Activity Tolerance Patient tolerated treatment well   Patient Left in chair;with call bell/phone within reach;with chair alarm set   Nurse Communication Mobility status;Precautions (neck bandage peeling off)        Time: 4098-1191 OT Time Calculation (min): 30 min  Charges: OT General Charges $OT Visit: 1 Visit OT Treatments $Self Care/Home Management : 8-22  mins $Therapeutic Activity: 8-22 mins  Carver Fila, OTD, OTR/L SecureChat Preferred Acute Rehab [phone]   Dalphine Handing 04/18/2023, 4:11 PM

## 2023-04-18 NOTE — Plan of Care (Signed)
  Problem: Education: Goal: Ability to describe self-care measures that may prevent or decrease complications (Diabetes Survival Skills Education) will improve Outcome: Progressing Goal: Individualized Educational Video(s) Outcome: Progressing   Problem: Coping: Goal: Ability to adjust to condition or change in health will improve Outcome: Progressing   Problem: Fluid Volume: Goal: Ability to maintain a balanced intake and output will improve Outcome: Progressing   Problem: Health Behavior/Discharge Planning: Goal: Ability to identify and utilize available resources and services will improve Outcome: Progressing Goal: Ability to manage health-related needs will improve Outcome: Progressing   Problem: Metabolic: Goal: Ability to maintain appropriate glucose levels will improve Outcome: Progressing   Problem: Nutritional: Goal: Maintenance of adequate nutrition will improve Outcome: Progressing Goal: Progress toward achieving an optimal weight will improve Outcome: Progressing   Problem: Skin Integrity: Goal: Risk for impaired skin integrity will decrease Outcome: Progressing   Problem: Tissue Perfusion: Goal: Adequacy of tissue perfusion will improve Outcome: Progressing   Problem: Education: Goal: Knowledge of General Education information will improve Description: Including pain rating scale, medication(s)/side effects and non-pharmacologic comfort measures Outcome: Progressing   Problem: Health Behavior/Discharge Planning: Goal: Ability to manage health-related needs will improve Outcome: Progressing   Problem: Clinical Measurements: Goal: Ability to maintain clinical measurements within normal limits will improve Outcome: Progressing Goal: Will remain free from infection Outcome: Progressing Goal: Diagnostic test results will improve Outcome: Progressing Goal: Respiratory complications will improve Outcome: Progressing Goal: Cardiovascular complication will  be avoided Outcome: Progressing   Problem: Activity: Goal: Risk for activity intolerance will decrease Outcome: Progressing   Problem: Nutrition: Goal: Adequate nutrition will be maintained Outcome: Progressing   Problem: Coping: Goal: Level of anxiety will decrease Outcome: Progressing   Problem: Elimination: Goal: Will not experience complications related to bowel motility Outcome: Progressing Goal: Will not experience complications related to urinary retention Outcome: Progressing   Problem: Pain Managment: Goal: General experience of comfort will improve Outcome: Progressing   Problem: Safety: Goal: Ability to remain free from injury will improve Outcome: Progressing   Problem: Skin Integrity: Goal: Risk for impaired skin integrity will decrease Outcome: Progressing   Problem: Education: Goal: Ability to verbalize activity precautions or restrictions will improve Outcome: Progressing Goal: Knowledge of the prescribed therapeutic regimen will improve Outcome: Progressing Goal: Understanding of discharge needs will improve Outcome: Progressing   Problem: Activity: Goal: Ability to avoid complications of mobility impairment will improve Outcome: Progressing Goal: Ability to tolerate increased activity will improve Outcome: Progressing Goal: Will remain free from falls Outcome: Progressing   Problem: Bowel/Gastric: Goal: Gastrointestinal status for postoperative course will improve Outcome: Progressing   Problem: Clinical Measurements: Goal: Ability to maintain clinical measurements within normal limits will improve Outcome: Progressing Goal: Postoperative complications will be avoided or minimized Outcome: Progressing Goal: Diagnostic test results will improve Outcome: Progressing   Problem: Pain Management: Goal: Pain level will decrease Outcome: Progressing   Problem: Skin Integrity: Goal: Will show signs of wound healing Outcome: Progressing    Problem: Health Behavior/Discharge Planning: Goal: Identification of resources available to assist in meeting health care needs will improve Outcome: Progressing   Problem: Bladder/Genitourinary: Goal: Urinary functional status for postoperative course will improve Outcome: Progressing   

## 2023-04-18 NOTE — Assessment & Plan Note (Addendum)
Stable. Pain remains well-controlled. POD #10 ACDF (possibly some concern for esophageal injury intraoperatively).  Pulled Hemovac 10/6.  [place] J collar still in place, will need to wear until follow-up visit. - Pain regimen: Tylenol 650 mg every 6 hours - Use incentive spirometry every hour - Vital signs with neurochecks every 4 hours - Outpatient neurosurgery follow-up in 3 weeks

## 2023-04-18 NOTE — Plan of Care (Signed)
  Problem: Tissue Perfusion: Goal: Adequacy of tissue perfusion will improve Outcome: Progressing   Problem: Activity: Goal: Risk for activity intolerance will decrease Outcome: Progressing   Problem: Nutrition: Goal: Adequate nutrition will be maintained Outcome: Progressing   Problem: Coping: Goal: Level of anxiety will decrease Outcome: Progressing   Problem: Elimination: Goal: Will not experience complications related to bowel motility Outcome: Progressing

## 2023-04-19 ENCOUNTER — Telehealth (HOSPITAL_COMMUNITY): Payer: Self-pay | Admitting: *Deleted

## 2023-04-19 DIAGNOSIS — F419 Anxiety disorder, unspecified: Secondary | ICD-10-CM | POA: Diagnosis not present

## 2023-04-19 DIAGNOSIS — M109 Gout, unspecified: Secondary | ICD-10-CM | POA: Diagnosis not present

## 2023-04-19 DIAGNOSIS — I4891 Unspecified atrial fibrillation: Secondary | ICD-10-CM | POA: Diagnosis not present

## 2023-04-19 DIAGNOSIS — F445 Conversion disorder with seizures or convulsions: Secondary | ICD-10-CM | POA: Diagnosis not present

## 2023-04-19 DIAGNOSIS — F250 Schizoaffective disorder, bipolar type: Secondary | ICD-10-CM | POA: Diagnosis not present

## 2023-04-19 DIAGNOSIS — W08XXXS Fall from other furniture, sequela: Secondary | ICD-10-CM | POA: Diagnosis not present

## 2023-04-19 DIAGNOSIS — R251 Tremor, unspecified: Secondary | ICD-10-CM | POA: Diagnosis not present

## 2023-04-19 DIAGNOSIS — G250 Essential tremor: Secondary | ICD-10-CM | POA: Diagnosis not present

## 2023-04-19 DIAGNOSIS — S12401D Unspecified nondisplaced fracture of fifth cervical vertebra, subsequent encounter for fracture with routine healing: Secondary | ICD-10-CM | POA: Diagnosis not present

## 2023-04-19 DIAGNOSIS — R262 Difficulty in walking, not elsewhere classified: Secondary | ICD-10-CM | POA: Diagnosis not present

## 2023-04-19 DIAGNOSIS — K5900 Constipation, unspecified: Secondary | ICD-10-CM | POA: Diagnosis not present

## 2023-04-19 DIAGNOSIS — N529 Male erectile dysfunction, unspecified: Secondary | ICD-10-CM | POA: Diagnosis not present

## 2023-04-19 DIAGNOSIS — E875 Hyperkalemia: Secondary | ICD-10-CM | POA: Diagnosis not present

## 2023-04-19 DIAGNOSIS — F429 Obsessive-compulsive disorder, unspecified: Secondary | ICD-10-CM | POA: Diagnosis not present

## 2023-04-19 DIAGNOSIS — R531 Weakness: Secondary | ICD-10-CM | POA: Diagnosis not present

## 2023-04-19 DIAGNOSIS — E1169 Type 2 diabetes mellitus with other specified complication: Secondary | ICD-10-CM | POA: Diagnosis not present

## 2023-04-19 DIAGNOSIS — Z7985 Long-term (current) use of injectable non-insulin antidiabetic drugs: Secondary | ICD-10-CM | POA: Diagnosis not present

## 2023-04-19 DIAGNOSIS — L719 Rosacea, unspecified: Secondary | ICD-10-CM | POA: Diagnosis not present

## 2023-04-19 DIAGNOSIS — Z79899 Other long term (current) drug therapy: Secondary | ICD-10-CM | POA: Diagnosis not present

## 2023-04-19 DIAGNOSIS — S12491A Other nondisplaced fracture of fifth cervical vertebra, initial encounter for closed fracture: Secondary | ICD-10-CM | POA: Diagnosis not present

## 2023-04-19 DIAGNOSIS — Z4789 Encounter for other orthopedic aftercare: Secondary | ICD-10-CM | POA: Diagnosis not present

## 2023-04-19 DIAGNOSIS — R279 Unspecified lack of coordination: Secondary | ICD-10-CM | POA: Diagnosis not present

## 2023-04-19 DIAGNOSIS — E119 Type 2 diabetes mellitus without complications: Secondary | ICD-10-CM | POA: Diagnosis not present

## 2023-04-19 DIAGNOSIS — Z7901 Long term (current) use of anticoagulants: Secondary | ICD-10-CM | POA: Diagnosis not present

## 2023-04-19 DIAGNOSIS — R42 Dizziness and giddiness: Secondary | ICD-10-CM | POA: Diagnosis not present

## 2023-04-19 DIAGNOSIS — M4800 Spinal stenosis, site unspecified: Secondary | ICD-10-CM | POA: Diagnosis not present

## 2023-04-19 DIAGNOSIS — W19XXXA Unspecified fall, initial encounter: Secondary | ICD-10-CM | POA: Diagnosis not present

## 2023-04-19 DIAGNOSIS — M1990 Unspecified osteoarthritis, unspecified site: Secondary | ICD-10-CM | POA: Diagnosis not present

## 2023-04-19 DIAGNOSIS — E039 Hypothyroidism, unspecified: Secondary | ICD-10-CM | POA: Diagnosis not present

## 2023-04-19 DIAGNOSIS — M6281 Muscle weakness (generalized): Secondary | ICD-10-CM | POA: Diagnosis not present

## 2023-04-19 DIAGNOSIS — E785 Hyperlipidemia, unspecified: Secondary | ICD-10-CM | POA: Diagnosis not present

## 2023-04-19 DIAGNOSIS — R296 Repeated falls: Secondary | ICD-10-CM | POA: Diagnosis not present

## 2023-04-19 DIAGNOSIS — F339 Major depressive disorder, recurrent, unspecified: Secondary | ICD-10-CM | POA: Diagnosis not present

## 2023-04-19 MED ORDER — OZEMPIC (1 MG/DOSE) 4 MG/3ML ~~LOC~~ SOPN: 0.5000 mg | PEN_INJECTOR | SUBCUTANEOUS | 1 refills | Status: DC

## 2023-04-19 NOTE — Assessment & Plan Note (Addendum)
Stable today. Pain remains well-controlled. POD #11 ACDF (possibly some concern for esophageal injury intraoperatively).  Pulled Hemovac 10/6.  [place] J collar still in place, will need to wear until follow-up neurosurgery visit. - Pain regimen: Tylenol 650 mg every 6 hours - Use incentive spirometry every hour - Vital signs with neurochecks every 4 hours - Outpatient neurosurgery follow-up in 3 weeks

## 2023-04-19 NOTE — Telephone Encounter (Signed)
We can reschedule but it would be in person and patient will need a discharge summary from rehab on his appointment.

## 2023-04-19 NOTE — Assessment & Plan Note (Signed)
Pending discharge to [facility]). Per PT/OT notes, patient is progressing towards goals while in the hospital. - Fall precautions

## 2023-04-19 NOTE — TOC Transition Note (Signed)
Transition of Care [facility]) - CM/SW Discharge Note   Patient Details  Name: [name] MRN: [id number] Date of Birth: 02/19/1961  Transition of Care [facility]) CM/SW Contact:  Baldemar Lenis, LCSW Phone Number: 04/19/2023, 1:04 PM   Clinical Narrative:   CSW notified by [facility] that they can admit today, attempted to arrange transportation to assist the patient but family has already arranged for transportation. CSW spoke with mom, [name], and she and her son are coming to the hospital to pick up the patient for transportation. CSW sent discharge information to [facility], confirmed receipt and room is ready. No further TOC needs.  Nurse to call report to [phone] [facility] Nurse.     Final next level of care: Skilled Nursing Facility Barriers to Discharge: Barriers Resolved   Patient Goals and CMS Choice      Discharge Placement                Patient chooses bed at:  [facility]) Patient to be transferred to facility by: Family Name of family member notified: [name] Patient and family notified of of transfer: 04/19/23  Discharge Plan and Services Additional resources added to the After Visit Summary for     Discharge Planning Services: CM Consult                                 Social Determinants of Health (SDOH) Interventions SDOH Screenings   Food Insecurity: No Food Insecurity (04/04/2023)  Housing: Low Risk  (04/04/2023)  Transportation Needs: No Transportation Needs (04/04/2023)  Utilities: Not At Risk (04/04/2023)  Alcohol Screen: Low Risk  (02/13/2023)  Depression (PHQ2-9): Low Risk  (02/19/2023)  Recent Concern: Depression (PHQ2-9) - Medium Risk (12/21/2022)  Financial Resource Strain: Low Risk  (02/13/2023)  Physical Activity: Inactive (02/13/2023)  Social Connections: Socially Isolated (02/13/2023)  Stress: No Stress Concern Present (02/13/2023)  Tobacco Use: Low Risk  (04/08/2023)  Health Literacy: Adequate Health Literacy (02/13/2023)      Readmission Risk Interventions     No data to display

## 2023-04-19 NOTE — Telephone Encounter (Signed)
Ok will make a note of that. Mother did not seem to think he would be back to this practice. However will advise if he does keep next appointment or reschedule for later.

## 2023-04-19 NOTE — Progress Notes (Signed)
Physical Therapy Treatment  Patient Details Name: [name] MRN: [id number] DOB: 12-19-1960 Today's Date: 04/19/2023   History of Present Illness Pt is a 62 y.o. male presenting via PTAR on 9/28 after recurrent falls. MRI cervical spine revealed acute fx extending through a bridging osteophyte at C5-6,  with extension through the underlying superior endplate of C6.  Subtle widening of the C5-6 interspace. Overall, constellation of  findings are consistent with an acute hyperextension injury. Acute anterior longitudinal ligament injury. S/p ACDF C5-6 10/3. PMH significant for significant for seizures, hypothyroidism, A-fib anticoagulated with Eliquis, DM, MDD, Anxiety and distant dx of schizoaffective d/o and OCD    PT Comments  Pt progressing towards physical therapy goals. Was able to perform transfers and pre-gait with gross mod assist and RW for support. Pt requiring increased assist for sit>stand transfers. Recommend staff be present to assist pt into the car at d/c as volunteers will not be able to provide a safe level of assistance needed at this time. Will continue to follow and progress as able per POC.     If plan is discharge home, recommend the following: A lot of help with walking and/or transfers;A lot of help with bathing/dressing/bathroom;Assistance with cooking/housework;Direct supervision/assist for financial management;Direct supervision/assist for medications management;Assist for transportation;Help with stairs or ramp for entrance;Supervision due to cognitive status   Can travel by private vehicle     Yes  Equipment Recommendations  Rolling walker (2 wheels);BSC/3in1    Recommendations for Other Services       Precautions / Restrictions Precautions Precautions: Fall;Cervical Precaution Booklet Issued: Yes (comment) (has in room from prior session) Precaution Comments: tremors Required Braces or Orthoses: Cervical Brace Cervical Brace: Hard collar;Other  (comment) (May remove when in bed, apply/remove in sitting, may remove to shower, may NOT ambulate to bathroom without brace) Restrictions Weight Bearing Restrictions: No     Mobility  Bed Mobility Overal bed mobility: Needs Assistance Bed Mobility: Rolling, Sidelying to Sit Rolling: Min assist Sidelying to sit: HOB elevated, Mod assist     Sit to sidelying: Mod assist General bed mobility comments: Heavy mod assist for transition to/from EOB. Pt required multimodal cues for log roll technique.    Transfers Overall transfer level: Needs assistance Equipment used: Rolling walker (2 wheels) Transfers: Sit to/from Stand Sit to Stand: Mod assist           General transfer comment: Heavy mod assist for power up to full stand 2 posterior lean. VC's for hand placement on seated surface for safety but pt wanting to pull to stand from walker.    Ambulation/Gait Ambulation/Gait assistance: Min assist           Pre-gait activities: Side stepping EOB to reposition towards HOB. Min assist for balance support and walker management.     Stairs             Wheelchair Mobility     Tilt Bed    Modified Rankin (Stroke Patients Only)       Balance Overall balance assessment: Needs assistance Sitting-balance support: Single extremity supported, Bilateral upper extremity supported, Feet supported Sitting balance-Leahy Scale: Poor Sitting balance - Comments: posterior lean requiring assist to recover during functional activity.   Standing balance support: Bilateral upper extremity supported, During functional activity, Reliant on assistive device for balance Standing balance-Leahy Scale: Poor Standing balance comment: In static standing, pt with posterior losses of balance and requires assist to recover.  Cognition Arousal: Alert Behavior During Therapy: WFL for tasks assessed/performed Overall Cognitive Status:  Impaired/Different from baseline Area of Impairment: Attention, Following commands, Safety/judgement, Awareness, Problem solving                   Current Attention Level: Sustained   Following Commands: Follows one step commands inconsistently Safety/Judgement: Decreased awareness of safety, Decreased awareness of deficits Awareness: Emergent Problem Solving: Slow processing, Decreased initiation, Difficulty sequencing, Requires verbal cues General Comments: Poor sequencing and generally slow processing time        Exercises      General Comments        Pertinent Vitals/Pain Pain Assessment Pain Assessment: No/denies pain Faces Pain Scale: No hurt Pain Intervention(s): Monitored during session    Home Living                          Prior Function            PT Goals (current goals can now be found in the care plan section) Acute Rehab PT Goals Patient Stated Goal: to improve PT Goal Formulation: With patient Time For Goal Achievement: 04/23/23 Potential to Achieve Goals: Good Progress towards PT goals: Progressing toward goals    Frequency    Min 1X/week      PT Plan      Co-evaluation              AM-PAC PT "6 Clicks" Mobility   Outcome Measure  Help needed turning from your back to your side while in a flat bed without using bedrails?: A Little Help needed moving from lying on your back to sitting on the side of a flat bed without using bedrails?: A Lot Help needed moving to and from a bed to a chair (including a wheelchair)?: A Lot Help needed standing up from a chair using your arms (e.g., wheelchair or bedside chair)?: A Lot Help needed to walk in hospital room?: A Little Help needed climbing 3-5 steps with a railing? : A Lot 6 Click Score: 14    End of Session Equipment Utilized During Treatment: Gait belt;Cervical collar Activity Tolerance: Patient tolerated treatment well Patient left: in chair;with call bell/phone  within reach;with chair alarm set Nurse Communication: Mobility status PT Visit Diagnosis: Unsteadiness on feet (R26.81);Other abnormalities of gait and mobility (R26.89);Muscle weakness (generalized) (M62.81);History of falling (Z91.81);Repeated falls (R29.6);Difficulty in walking, not elsewhere classified (R26.2)     Time: 4098-1191 PT Time Calculation (min) (ACUTE ONLY): 27 min  Charges:    $Therapeutic Activity: 8-22 mins $Self Care/Home Management: 8-22 PT General Charges $$ ACUTE PT VISIT: 1 Visit                     Conni Slipper, PT, DPT Acute Rehabilitation Services Secure Chat Preferred Office: [phone]    Marylynn Pearson 04/19/2023, 3:13 PM

## 2023-04-19 NOTE — Plan of Care (Signed)
  Problem: Health Behavior/Discharge Planning: Goal: Ability to manage health-related needs will improve Outcome: Progressing   Problem: Education: Goal: Knowledge of General Education information will improve Description: Including pain rating scale, medication(s)/side effects and non-pharmacologic comfort measures Outcome: Progressing   Problem: Tissue Perfusion: Goal: Adequacy of tissue perfusion will improve Outcome: Progressing   Problem: Nutritional: Goal: Progress toward achieving an optimal weight will improve Outcome: Progressing   Problem: Nutritional: Goal: Maintenance of adequate nutrition will improve Outcome: Progressing

## 2023-04-19 NOTE — Assessment & Plan Note (Addendum)
Patient oriented to self, hospital, situation.  Some paranoia, reoriented.  Likely an element of hypoactive delirium. - Continue benztropine 0.5 mg daily at bedtime - Continue Depakote 1000 mg every 24 hours at bedtime - Continue phenelzine 30 mg twice daily

## 2023-04-19 NOTE — Telephone Encounter (Signed)
Pt's mother, Annabelle Harman, LVM requesting that pt's upcomming appointment on 04/22/23 be cancelled as pt is in medical rehab currently, or will be today, and the facility is in [place]. This was scheduled as an in office visit. Mother stated that she wasn't really sure about any future appointments. Writer messaged front desk to tentatively schedule pt for 3 months out. FYI.

## 2023-04-19 NOTE — Progress Notes (Addendum)
Daily Progress Note Intern Pager: [phone]  Patient name: [name] Medical record number: [id number] Date of birth: 04/22/1961 Age: 62 y.o. Gender: male  Primary Care Provider: Tiffany Kocher, DO Consultants: Neurosurgery Code Status: Full  Pt Overview and Major Events to Date:   Tasean Mancha is a 62 year old male with profound dementia (A&O to self, family) admitted s/p repeated falls at home and found to have a C5 fracture, status post stabilization with neurosurgery 10/3.  Medically stable for discharge.   9/28: Admitted for AMS.  C5 fracture found on imaging.  C-spine collar placed. 9/29: Psych consulted, decreased his home psych medications to reduce anticholinergic side effects. 9/30: Patient mentation improved.  Begun on heparin 5000 units every 8 hours. 10/1: Reversible causes of dementia (B12, RPR).  B12 negative. 10/3- Operation with neurosurgery: ACDF C5-C6 and overnight observation in neuro-ICU 10/4- Psychiatry signed off 10/5- FMTS resumed care on med-tele floor Assessment & Plan C5 cervical fracture (HCC) Stable today. Pain remains well-controlled. POD #11 ACDF (possibly some concern for esophageal injury intraoperatively).  Pulled Hemovac 10/6.  [place] collar still in place, will need to wear until follow-up neurosurgery visit. - Pain regimen: Tylenol 650 mg every 6 hours - Use incentive spirometry every hour - Vital signs with neurochecks every 4 hours - Outpatient neurosurgery follow-up in 3 weeks Multiple falls Pending discharge to [facility] (SNF). Per PT/OT notes, patient is progressing towards goals while in the hospital. - Fall precautions Disorientation Patient oriented to self, hospital, situation.  Some paranoia, reoriented.  Likely an element of hypoactive delirium. - Continue benztropine 0.5 mg daily at bedtime - Continue Depakote 1000 mg every 24 hours at bedtime - Continue phenelzine 30 mg twice daily  Chronic and Stable Issues:  Tremor:  Bilateral upper and lower extremity.  Neurology recommended outpatient follow-up. Gout: Allopurinol 300 mg daily A-fib: Eliquis 5 mg twice daily (restarted 10/9) T2DM: Well-controlled, restart Ozempic at 0.5 mg qWeek at [facility] as patient has not had his shot for ~3 weeks. Hypothyroidism: Synthroid 225 mcg daily HLD: Atorvastatin 40 mg daily  FEN/GI: Regular PPx: Eliquis Dispo: Pending discharge to [facility]  Subjective:   No acute events overnight.  On interview, patient had some paranoia about a member of staff sneaking into his home.  Reassured that this was highly improbable.  Per patient, brother said yesterday that he would likely leave the hospital around 4 PM today.  No new complaints.  Denied pain at present.  Is excited to be discharged.  Objective:  BP: 107/80 HR: 75 RR: 17 T: 98.4 O2sat: 94% on room air  Significant vitals over past 24 hours: None  Physical Exam:  General: NAD, resting in bed, chronically ill-appearing, cervical collar in place Cardiovascular: Irregular rate and rhythm Respiratory: CTAB, normal WOB on RA Extremities: Moving all 4 extremities, no edema   Basic labs:  Most recent CBC Lab Results  Component Value Date   WBC 10.2 04/12/2023   HGB 13.7 04/12/2023   HCT 41.4 04/12/2023   MCV 90.4 04/12/2023   PLT 143 (L) 04/12/2023   Most recent BMP    Latest Ref Rng & Units 04/12/2023    8:16 AM  BMP  Glucose 70 - 99 mg/dL 94   BUN 8 - 23 mg/dL 16   Creatinine 1.47 - 1.24 mg/dL 8.29   Sodium 562 - 130 mmol/L 136   Potassium 3.5 - 5.1 mmol/L 4.0   Chloride 98 - 111 mmol/L 97   CO2 22 - 32  mmol/L 27   Calcium 8.9 - 10.3 mg/dL 8.5     Other pertinent labs:  No new labs.  Imaging/Diagnostic Tests:  No new imaging.  Tomie China, MD 04/19/2023, 7:34 AM  PGY-1, [facility] Family Medicine FPTS Intern pager: [phone], text pages welcome Secure chat group [facility] Teaching Service

## 2023-04-19 NOTE — Discharge Summary (Addendum)
Family Medicine Teaching [facility] Discharge Summary  Patient name: [name] Medical record number: [id number] Date of birth: 16-Jan-1961 Age: 62 y.o. Gender: male Date of Admission: 04/03/2023  Date of Discharge: 10/14 Admitting Physician: Anthony Mans, MD  Primary Care Provider: Tiffany Kocher, DO Consultants: Neurosurgery  Indication for Hospitalization: C5 surgical fracture  Discharge Diagnoses/Problem List:  Principal Problem for Admission: C5 cervical fracture Other Problems addressed during stay:  Principal Problem:   C5 cervical fracture [name] Active Problems:   Multiple falls   Disorientation    Brief Hospital Course:  [name] is a 62 y.o.male with a history of Afib, seizure disorder, schizoaffective disorder, MDD who was admitted to the [facility] Teaching Service at [facility] for Altered Mental Status. His hospital course is detailed below:  C5 cervical fracture  Diffuse idiopathic skeletal hyperostosis Presented after fall, found on floor by EMS, and having new neck pain. Imaging including CT spine and MRI spine showed unstable C5-C6 fracture.  Pain controlled on acetaminophen 650 mg.  Neurosurgery consulted, placed patient in C-collar, then performed operative stabilization 10/3 without incident. Patient made impressive progress while inpatient. Patient discharged to SNF at [facility] for continued recovery.  Altered Mental Status  Chronic Memory and Balance issues 2/2  Psych Meds  Schizoaffective Disorder  MDD Pt presented confused and balance issues leading to multiple falls, which lead to cervical fracture as above. CT head showed chronic atrophy, but no acute changes.  Labs negative for acute intoxication, infection, reversible causes of dementia. Psychiatry was consulted inpt, and reduced his Nardil, Trilafon, Congentin. Mental status waxed and waned over admission similar to observations by his mother.  Seemed to improve to baseline by  discharge.  Hypothyroidism Pt has elevated TSH on admission. Pt has history of hypothyroidism, previously on 100 mcg Levothyroxine, which was increased to 125 mcg by discharge.  Other chronic conditions were medically managed with home medications and formulary alternatives as necessary: HLD: atorvastatin 40 mg daily T2DM: Managed w/ SSI while admitted.  Likely last took his home Ozempic ~ 9/24.  PCP Follow-up Recommendations: F/u thyroid meds F/u with OP psychiatrist 10/17. Adjustments were made to Nardil, Trilafon, and Congentin. Consider neurology referral for concern for Parkinson's vs other causes of tremor (has appt December 9) Consider outpatient sleep study for OSA Continue to titrate up on semaglutide given resuming at 0.5 due to being off medications for 3 weeks  Disposition: SNF at [facility]  Discharge Condition: Stable  Discharge Exam:  Vitals:   04/19/23 0320 04/19/23 0809  BP: 107/80 (!) 132/96  Pulse: 75 77  Resp:  18  Temp: 98.4 F (36.9 C) 97.8 F (36.6 C)  SpO2: 94% 95%   General: Well-appearing elderly male, NAD, attentive to interview.   Cardiovascular: Regular rate no m/r/g. Respiratory: CTAB. No w/r/r.  Abdomen: No tenderness in 4 quadrants. BS+.  Extremities: ROM intact.  Nonedematous. Neuro: Alert and oriented to self, situation, writer  Significant Procedures: C5 stabilization 10/3  Significant Labs and Imaging:     Latest Ref Rng & Units 04/12/2023    8:16 AM 04/11/2023    9:31 AM 04/07/2023    5:23 AM  CBC  WBC 4.0 - 10.5 K/uL 10.2  11.6  8.8   Hemoglobin 13.0 - 17.0 g/dL 46.9  62.9  52.8   Hematocrit 39.0 - 52.0 % 41.4  41.9  38.9   Platelets 150 - 400 K/uL 143  199  142        Latest Ref  Rng & Units 04/12/2023    8:16 AM 04/11/2023    9:31 AM 04/05/2023    7:50 AM  CMP  Glucose 70 - 99 mg/dL 94  562  130   BUN 8 - 23 mg/dL 16  17  8    Creatinine 0.61 - 1.24 mg/dL 8.65  7.84  6.96   Sodium 135 - 145 mmol/L 136  133  136   Potassium  3.5 - 5.1 mmol/L 4.0  3.6  3.6   Chloride 98 - 111 mmol/L 97  94  98   CO2 22 - 32 mmol/L 27  30  26    Calcium 8.9 - 10.3 mg/dL 8.5  8.7  8.6     Urinalysis    Component Value Date/Time   COLORURINE AMBER (A) 04/02/2023 2109   APPEARANCEUR CLEAR 04/02/2023 2109   LABSPEC 1.025 04/02/2023 2109   PHURINE 5.0 04/02/2023 2109   GLUCOSEU NEGATIVE 04/02/2023 2109   HGBUR NEGATIVE 04/02/2023 2109   BILIRUBINUR NEGATIVE 04/02/2023 2109   KETONESUR 5 (A) 04/02/2023 2109   PROTEINUR 30 (A) 04/02/2023 2109   UROBILINOGEN 0.2 03/24/2015 1047   NITRITE NEGATIVE 04/02/2023 2109   LEUKOCYTESUR NEGATIVE 04/02/2023 2109   XR chest (09/27): Lower lung volumes with mild bibasilar atelectasis.  No acute cardiopulmonary process.  CT head (9/27): Atrophy, chronic microvascular disease.  No acute intracranial abnormality.  CT cervical spine without contrast (9/28): Acute fractures of the anterior osteophyte at C5-C6 extending into the superior aspect of the C6 vertebra. Findings consistent with a hyperextension injury. Further evaluation with MRI is recommended.  MR cervical spine (9/28):  1. Acute fracture extending through a bridging osteophyte at C5-6, with extension through the underlying superior endplate of C6. Subtle widening of the C5-6 interspace. Overall, constellation of findings are consistent with an acute hyperextension injury. 2. Acute ligamentous injury involving the anterior longitudinal ligament, with suspected additional ligamentous injury of the posterior longitudinal ligament and posterior interspinous ligaments. This is consistent with an unstable injury. 3. No evidence for traumatic cord injury. 4. Diffuse bridging osteophytic spurring with secondary bony ankylosis throughout the visualized cervicothoracic spine, compatible with DISH. 5. Underlying multilevel cervical spondylosis, most pronounced at C5-6 where there is resultant moderate to severe spinal stenosis. Severe  bilateral C6 and C7 foraminal stenosis.  XR C-spine (10/3):  Intraoperative localization and anterior fusion C4-C7.   Results/Tests Pending at Time of Discharge: None  Discharge Medications:  Allergies as of 04/19/2023       Reactions   Lamictal [lamotrigine] Rash   Metformin And Related Rash        Medication List     TAKE these medications    acetaminophen 500 MG tablet Commonly known as: TYLENOL Take 500-1,000 mg by mouth 2 (two) times daily as needed for mild pain.   allopurinol 300 MG tablet Commonly known as: ZYLOPRIM TAKE 1 TABLET BY MOUTH EVERY DAY   apixaban 5 MG Tabs tablet Commonly known as: Eliquis Take 1 tablet (5 mg total) by mouth 2 (two) times daily.   atorvastatin 40 MG tablet Commonly known as: LIPITOR TAKE 1 TABLET BY MOUTH EVERY DAY   benztropine 0.5 MG tablet Commonly known as: COGENTIN Take 1 tablet (0.5 mg total) by mouth at bedtime. What changed:  medication strength how much to take when to take this   calcium carbonate 500 MG chewable tablet Commonly known as: TUMS - dosed in mg elemental calcium Chew 1 tablet (200 mg of elemental calcium total) by mouth daily  as needed for indigestion or heartburn.   divalproex 500 MG 24 hr tablet Commonly known as: DEPAKOTE ER Take 2 tablets (1,000 mg total) by mouth at bedtime. What changed:  how much to take when to take this   levothyroxine 125 MCG tablet Commonly known as: SYNTHROID Take 1 tablet (125 mcg total) by mouth daily. What changed:  medication strength how much to take   lidocaine 5 % Commonly known as: LIDODERM Place 1 patch onto the skin daily. Remove & Discard patch within 12 hours or as directed by MD   Ozempic (1 MG/DOSE) 4 MG/3ML Sopn Generic drug: Semaglutide (1 MG/DOSE) Inject 0.5 mg into the skin once a week. What changed: how much to take   perphenazine 16 MG tablet Commonly known as: TRILAFON Take 1 tablet (16 mg total) by mouth at bedtime. What changed:   medication strength how much to take   phenelzine 15 MG tablet Commonly known as: NARDIL Take 2 tablets (30 mg total) by mouth 2 (two) times daily. What changed: how much to take   phenol 1.4 % Liqd Commonly known as: CHLORASEPTIC Use as directed 1 spray in the mouth or throat as needed for throat irritation / pain.   polyethylene glycol 17 g packet Commonly known as: MIRALAX / GLYCOLAX Take 34 g by mouth daily.   senna-docusate 8.6-50 MG tablet Commonly known as: Senokot-S Take 1 tablet by mouth at bedtime.        Discharge Instructions: Please refer to Patient Instructions section of EMR for full details.  Patient was counseled important signs and symptoms that should prompt return to medical care, changes in medications, dietary instructions, activity restrictions, and follow up appointments.   Follow-Up Appointments:  Follow-up Information     Lisbeth Renshaw, MD Follow up in 3 week(s).   Specialty: Neurosurgery Contact information: [address] [place] [address] [phone]         Anthony Kocher, DO. Schedule an appointment as soon as possible for a visit.   Specialty: Family Medicine Why: Make an appointment for hospital follow up ASAP. Contact information: [address] [place] [address] [phone]                 Tomie China, MD 04/19/2023, 12:30 PM PGY-1, [facility] Family Medicine  I agree with the assessment and plan as documented above in the resident's note.  Janeal Holmes, MD                  04/19/2023, 12:39 PM PGY-2, [facility]

## 2023-04-21 DIAGNOSIS — R251 Tremor, unspecified: Secondary | ICD-10-CM | POA: Diagnosis not present

## 2023-04-21 DIAGNOSIS — R531 Weakness: Secondary | ICD-10-CM | POA: Diagnosis not present

## 2023-04-21 DIAGNOSIS — F250 Schizoaffective disorder, bipolar type: Secondary | ICD-10-CM | POA: Diagnosis not present

## 2023-04-21 DIAGNOSIS — S12401D Unspecified nondisplaced fracture of fifth cervical vertebra, subsequent encounter for fracture with routine healing: Secondary | ICD-10-CM | POA: Diagnosis not present

## 2023-04-21 DIAGNOSIS — M109 Gout, unspecified: Secondary | ICD-10-CM | POA: Diagnosis not present

## 2023-04-21 DIAGNOSIS — F419 Anxiety disorder, unspecified: Secondary | ICD-10-CM | POA: Diagnosis not present

## 2023-04-21 DIAGNOSIS — F429 Obsessive-compulsive disorder, unspecified: Secondary | ICD-10-CM | POA: Diagnosis not present

## 2023-04-21 DIAGNOSIS — F339 Major depressive disorder, recurrent, unspecified: Secondary | ICD-10-CM | POA: Diagnosis not present

## 2023-04-21 DIAGNOSIS — E039 Hypothyroidism, unspecified: Secondary | ICD-10-CM | POA: Diagnosis not present

## 2023-04-21 DIAGNOSIS — Z7901 Long term (current) use of anticoagulants: Secondary | ICD-10-CM | POA: Diagnosis not present

## 2023-04-22 ENCOUNTER — Ambulatory Visit (HOSPITAL_COMMUNITY): Payer: Medicare HMO | Admitting: Psychiatry

## 2023-04-23 DIAGNOSIS — Z7901 Long term (current) use of anticoagulants: Secondary | ICD-10-CM | POA: Diagnosis not present

## 2023-04-23 DIAGNOSIS — R531 Weakness: Secondary | ICD-10-CM | POA: Diagnosis not present

## 2023-04-23 DIAGNOSIS — S12401D Unspecified nondisplaced fracture of fifth cervical vertebra, subsequent encounter for fracture with routine healing: Secondary | ICD-10-CM | POA: Diagnosis not present

## 2023-04-23 DIAGNOSIS — E1169 Type 2 diabetes mellitus with other specified complication: Secondary | ICD-10-CM | POA: Diagnosis not present

## 2023-04-25 DIAGNOSIS — F419 Anxiety disorder, unspecified: Secondary | ICD-10-CM | POA: Diagnosis not present

## 2023-04-25 DIAGNOSIS — W08XXXS Fall from other furniture, sequela: Secondary | ICD-10-CM | POA: Diagnosis not present

## 2023-04-25 DIAGNOSIS — E119 Type 2 diabetes mellitus without complications: Secondary | ICD-10-CM | POA: Diagnosis not present

## 2023-04-25 DIAGNOSIS — F250 Schizoaffective disorder, bipolar type: Secondary | ICD-10-CM | POA: Diagnosis not present

## 2023-04-26 DIAGNOSIS — E875 Hyperkalemia: Secondary | ICD-10-CM | POA: Diagnosis not present

## 2023-04-26 DIAGNOSIS — S12401D Unspecified nondisplaced fracture of fifth cervical vertebra, subsequent encounter for fracture with routine healing: Secondary | ICD-10-CM | POA: Diagnosis not present

## 2023-04-26 DIAGNOSIS — E039 Hypothyroidism, unspecified: Secondary | ICD-10-CM | POA: Diagnosis not present

## 2023-04-26 DIAGNOSIS — R531 Weakness: Secondary | ICD-10-CM | POA: Diagnosis not present

## 2023-04-28 DIAGNOSIS — S12401D Unspecified nondisplaced fracture of fifth cervical vertebra, subsequent encounter for fracture with routine healing: Secondary | ICD-10-CM | POA: Diagnosis not present

## 2023-04-28 DIAGNOSIS — E875 Hyperkalemia: Secondary | ICD-10-CM | POA: Diagnosis not present

## 2023-04-28 DIAGNOSIS — R531 Weakness: Secondary | ICD-10-CM | POA: Diagnosis not present

## 2023-04-28 DIAGNOSIS — E1169 Type 2 diabetes mellitus with other specified complication: Secondary | ICD-10-CM | POA: Diagnosis not present

## 2023-05-03 DIAGNOSIS — S12401D Unspecified nondisplaced fracture of fifth cervical vertebra, subsequent encounter for fracture with routine healing: Secondary | ICD-10-CM | POA: Diagnosis not present

## 2023-05-03 DIAGNOSIS — Z4789 Encounter for other orthopedic aftercare: Secondary | ICD-10-CM | POA: Diagnosis not present

## 2023-05-03 DIAGNOSIS — R279 Unspecified lack of coordination: Secondary | ICD-10-CM | POA: Diagnosis not present

## 2023-05-03 DIAGNOSIS — M6281 Muscle weakness (generalized): Secondary | ICD-10-CM | POA: Diagnosis not present

## 2023-05-03 DIAGNOSIS — R531 Weakness: Secondary | ICD-10-CM | POA: Diagnosis not present

## 2023-05-03 DIAGNOSIS — R262 Difficulty in walking, not elsewhere classified: Secondary | ICD-10-CM | POA: Diagnosis not present

## 2023-05-03 DIAGNOSIS — E1169 Type 2 diabetes mellitus with other specified complication: Secondary | ICD-10-CM | POA: Diagnosis not present

## 2023-05-04 DIAGNOSIS — M6281 Muscle weakness (generalized): Secondary | ICD-10-CM | POA: Diagnosis not present

## 2023-05-04 DIAGNOSIS — F250 Schizoaffective disorder, bipolar type: Secondary | ICD-10-CM | POA: Diagnosis not present

## 2023-05-04 DIAGNOSIS — R279 Unspecified lack of coordination: Secondary | ICD-10-CM | POA: Diagnosis not present

## 2023-05-04 DIAGNOSIS — Z4789 Encounter for other orthopedic aftercare: Secondary | ICD-10-CM | POA: Diagnosis not present

## 2023-05-04 DIAGNOSIS — R262 Difficulty in walking, not elsewhere classified: Secondary | ICD-10-CM | POA: Diagnosis not present

## 2023-05-05 DIAGNOSIS — R531 Weakness: Secondary | ICD-10-CM | POA: Diagnosis not present

## 2023-05-05 DIAGNOSIS — Z4789 Encounter for other orthopedic aftercare: Secondary | ICD-10-CM | POA: Diagnosis not present

## 2023-05-05 DIAGNOSIS — R279 Unspecified lack of coordination: Secondary | ICD-10-CM | POA: Diagnosis not present

## 2023-05-05 DIAGNOSIS — E1169 Type 2 diabetes mellitus with other specified complication: Secondary | ICD-10-CM | POA: Diagnosis not present

## 2023-05-05 DIAGNOSIS — R262 Difficulty in walking, not elsewhere classified: Secondary | ICD-10-CM | POA: Diagnosis not present

## 2023-05-05 DIAGNOSIS — S12401D Unspecified nondisplaced fracture of fifth cervical vertebra, subsequent encounter for fracture with routine healing: Secondary | ICD-10-CM | POA: Diagnosis not present

## 2023-05-05 DIAGNOSIS — M6281 Muscle weakness (generalized): Secondary | ICD-10-CM | POA: Diagnosis not present

## 2023-05-06 DIAGNOSIS — R262 Difficulty in walking, not elsewhere classified: Secondary | ICD-10-CM | POA: Diagnosis not present

## 2023-05-06 DIAGNOSIS — R279 Unspecified lack of coordination: Secondary | ICD-10-CM | POA: Diagnosis not present

## 2023-05-06 DIAGNOSIS — M6281 Muscle weakness (generalized): Secondary | ICD-10-CM | POA: Diagnosis not present

## 2023-05-06 DIAGNOSIS — Z4789 Encounter for other orthopedic aftercare: Secondary | ICD-10-CM | POA: Diagnosis not present

## 2023-05-06 DIAGNOSIS — F411 Generalized anxiety disorder: Secondary | ICD-10-CM | POA: Diagnosis not present

## 2023-05-07 DIAGNOSIS — S12401D Unspecified nondisplaced fracture of fifth cervical vertebra, subsequent encounter for fracture with routine healing: Secondary | ICD-10-CM | POA: Diagnosis not present

## 2023-05-07 DIAGNOSIS — R262 Difficulty in walking, not elsewhere classified: Secondary | ICD-10-CM | POA: Diagnosis not present

## 2023-05-07 DIAGNOSIS — E1169 Type 2 diabetes mellitus with other specified complication: Secondary | ICD-10-CM | POA: Diagnosis not present

## 2023-05-07 DIAGNOSIS — M6281 Muscle weakness (generalized): Secondary | ICD-10-CM | POA: Diagnosis not present

## 2023-05-07 DIAGNOSIS — R531 Weakness: Secondary | ICD-10-CM | POA: Diagnosis not present

## 2023-05-07 DIAGNOSIS — Z4789 Encounter for other orthopedic aftercare: Secondary | ICD-10-CM | POA: Diagnosis not present

## 2023-05-07 DIAGNOSIS — R251 Tremor, unspecified: Secondary | ICD-10-CM | POA: Diagnosis not present

## 2023-05-07 DIAGNOSIS — R279 Unspecified lack of coordination: Secondary | ICD-10-CM | POA: Diagnosis not present

## 2023-05-07 DIAGNOSIS — E875 Hyperkalemia: Secondary | ICD-10-CM | POA: Diagnosis not present

## 2023-05-10 DIAGNOSIS — E039 Hypothyroidism, unspecified: Secondary | ICD-10-CM | POA: Diagnosis not present

## 2023-05-10 DIAGNOSIS — E1169 Type 2 diabetes mellitus with other specified complication: Secondary | ICD-10-CM | POA: Diagnosis not present

## 2023-05-10 DIAGNOSIS — R262 Difficulty in walking, not elsewhere classified: Secondary | ICD-10-CM | POA: Diagnosis not present

## 2023-05-10 DIAGNOSIS — Z79899 Other long term (current) drug therapy: Secondary | ICD-10-CM | POA: Diagnosis not present

## 2023-05-10 DIAGNOSIS — R531 Weakness: Secondary | ICD-10-CM | POA: Diagnosis not present

## 2023-05-10 DIAGNOSIS — F445 Conversion disorder with seizures or convulsions: Secondary | ICD-10-CM | POA: Diagnosis not present

## 2023-05-10 DIAGNOSIS — S12401D Unspecified nondisplaced fracture of fifth cervical vertebra, subsequent encounter for fracture with routine healing: Secondary | ICD-10-CM | POA: Diagnosis not present

## 2023-05-10 DIAGNOSIS — I4891 Unspecified atrial fibrillation: Secondary | ICD-10-CM | POA: Diagnosis not present

## 2023-05-10 DIAGNOSIS — Z4789 Encounter for other orthopedic aftercare: Secondary | ICD-10-CM | POA: Diagnosis not present

## 2023-05-10 DIAGNOSIS — M6281 Muscle weakness (generalized): Secondary | ICD-10-CM | POA: Diagnosis not present

## 2023-05-10 DIAGNOSIS — E785 Hyperlipidemia, unspecified: Secondary | ICD-10-CM | POA: Diagnosis not present

## 2023-05-10 DIAGNOSIS — M4800 Spinal stenosis, site unspecified: Secondary | ICD-10-CM | POA: Diagnosis not present

## 2023-05-10 DIAGNOSIS — E119 Type 2 diabetes mellitus without complications: Secondary | ICD-10-CM | POA: Diagnosis not present

## 2023-05-10 DIAGNOSIS — F250 Schizoaffective disorder, bipolar type: Secondary | ICD-10-CM | POA: Diagnosis not present

## 2023-05-10 DIAGNOSIS — F419 Anxiety disorder, unspecified: Secondary | ICD-10-CM | POA: Diagnosis not present

## 2023-05-10 DIAGNOSIS — R279 Unspecified lack of coordination: Secondary | ICD-10-CM | POA: Diagnosis not present

## 2023-05-11 DIAGNOSIS — S12401D Unspecified nondisplaced fracture of fifth cervical vertebra, subsequent encounter for fracture with routine healing: Secondary | ICD-10-CM | POA: Diagnosis not present

## 2023-05-11 DIAGNOSIS — R42 Dizziness and giddiness: Secondary | ICD-10-CM | POA: Diagnosis not present

## 2023-05-11 DIAGNOSIS — R251 Tremor, unspecified: Secondary | ICD-10-CM | POA: Diagnosis not present

## 2023-05-11 DIAGNOSIS — Z4789 Encounter for other orthopedic aftercare: Secondary | ICD-10-CM | POA: Diagnosis not present

## 2023-05-12 ENCOUNTER — Telehealth (HOSPITAL_COMMUNITY): Payer: Self-pay | Admitting: *Deleted

## 2023-05-12 NOTE — Telephone Encounter (Signed)
Pt LVM requesting refill of Depakote which hasn't been written by you since 2020 but seems was given to him on recent inpatient. Pt is currently in medical rehab [facility]) and says they are not giving him this medication.FYI. Writer will advise pt and update. He has an appointment in office on 07/21/22.

## 2023-05-13 ENCOUNTER — Ambulatory Visit (INDEPENDENT_AMBULATORY_CARE_PROVIDER_SITE_OTHER): Payer: Medicare HMO | Admitting: Student

## 2023-05-13 ENCOUNTER — Encounter: Payer: Self-pay | Admitting: Student

## 2023-05-13 VITALS — BP 123/74 | HR 82 | Ht 62.0 in | Wt 224.4 lb

## 2023-05-13 DIAGNOSIS — E039 Hypothyroidism, unspecified: Secondary | ICD-10-CM | POA: Diagnosis not present

## 2023-05-13 DIAGNOSIS — Z1211 Encounter for screening for malignant neoplasm of colon: Secondary | ICD-10-CM | POA: Diagnosis not present

## 2023-05-13 DIAGNOSIS — Z0000 Encounter for general adult medical examination without abnormal findings: Secondary | ICD-10-CM | POA: Diagnosis not present

## 2023-05-13 DIAGNOSIS — Z7985 Long-term (current) use of injectable non-insulin antidiabetic drugs: Secondary | ICD-10-CM | POA: Diagnosis not present

## 2023-05-13 DIAGNOSIS — E119 Type 2 diabetes mellitus without complications: Secondary | ICD-10-CM

## 2023-05-13 MED ORDER — TRULICITY 0.75 MG/0.5ML ~~LOC~~ SOAJ
0.7500 mg | SUBCUTANEOUS | 0 refills | Status: DC
Start: 2023-05-13 — End: 2023-06-08

## 2023-05-13 MED ORDER — TRULICITY 0.75 MG/0.5ML ~~LOC~~ SOAJ
0.7500 mg | SUBCUTANEOUS | 0 refills | Status: DC
Start: 2023-05-13 — End: 2023-05-13

## 2023-05-13 NOTE — Patient Instructions (Addendum)
It was great to see you! Thank you for allowing me to participate in your care!   I recommend that you always bring your medications to each appointment as this makes it easy to ensure we are on the correct medications and helps [place] not miss when refills are needed.  Our plans for today:  - I have sent a referral for Colonoscopy, they will call you in 2-4 weeks to schedule - Please go to [facility] for a dilated eye exam - You do not need to check your blood sugar every day. Please check blood sugar when you suspect your blood sugar is too low. Symptoms of low blood sugar include: Sweating, fast heart beat, nausea, vomiting, dizziness -We will recheck your thyroid level in 4 weeks - Please call your neurosurgeon for questions regarding neck brace. Dr. Conchita Paris performed your surgery. His number is: [phone]    Take care and seek immediate care sooner if you develop any concerns. Please remember to show up 15 minutes before your scheduled appointment time!  Tiffany Kocher, DO [facility] Family Medicine

## 2023-05-13 NOTE — Progress Notes (Signed)
    SUBJECTIVE:   CHIEF COMPLAINT / HPI:   Follow-up s/p rehab and hospitalization Patient was hospitalized on 04/03/2023 for AMS and falls.  Found to have nondisplaced fracture of fifth cervical vertebra, is now s/p ACDF surgery on 04/08/2023.  Rehab went well, patient is walking and has not had any more falls.  He is now living at assisted living, given concern for his wellbeing.  Still has tremors at rest, he has follow-up with neurology for Parkinsonism features-however still strongly suspect his tremors and gait are related to chronic antipsychotic medication.  He is doing well at [facility].  His facility manager accompanied him at the visit.  We provided and updated medication list.  Type 2 diabetes Last A1c in 04/04/2023 was 6.4, within goal.  He was well-controlled on Ozempic, however Ozempic is nonformulary at his pharmacy at assisted living.  He is now using Trulicity, and prescription was prescribed.  Hypothyroid During his hospitalization, his TSH was elevated and his Synthroid was increased from 100 mcg to 125 mcg.  Plan to recheck this at next visit.  Health maintenance Due for colonoscopy, diabetic eye exam.  Discussed.  OBJECTIVE:   BP 123/74   Pulse 82   Ht 5\' 2"  (1.575 m)   Wt 224 lb 6.4 oz (101.8 kg)   SpO2 97%   BMI 41.04 kg/m    General: NAD, pleasant Cardio: RRR, no MRG. Cap Refill <2s. Respiratory: CTAB, normal wob on RA Skin: Warm and dry Neuro: CN II: PERRL CN III, IV,VI: EOMI, 3 beats of lateral nystagmus CV V: Normal sensation in V1, V2, V3 CVII: Symmetric smile and brow raise CN VIII: Normal hearing CN IX,X: Symmetric palate raise  CN XI: 5/5 shoulder shrug CN XII: Symmetric tongue protrusion  UE and LE strength 5/5 Normal sensation in UE and LE bilaterally   ASSESSMENT/PLAN:   Assessment & Plan Type 2 diabetes mellitus without complication, without long-term current use of insulin (HCC) A1c of 6.4 in September.  Patient was well-controlled  on Ozempic.  Formulary requires Trulicity now, prescription sent. - 0.75 mg of Trulicity weekly, 4 weeks - Titrate to 1.5 mg of Trulicity weekly at next visit - Repeat A1c at next visit Hypothyroidism, unspecified type TSH elevated to 10 during last admission.  Previously well-controlled. - Continue 125 mcg Synthroid - Repeat TSH next visit Healthcare maintenance Referral for colonoscopy sent. Patient needs diabetic eye exam, facility will help schedule.  Follow-up recommendations Needs TSH Consider discussing flu shot again, although patient declined today  Tiffany Kocher, DO [facility]

## 2023-05-13 NOTE — Telephone Encounter (Signed)
He was admitted because of change mental status and fall.  He is taking Depakote for seizure and he should consult neurology.

## 2023-05-13 NOTE — Assessment & Plan Note (Signed)
A1c of 6.4 in September.  Patient was well-controlled on Ozempic.  Formulary requires Trulicity now, prescription sent. - 0.75 mg of Trulicity weekly, 4 weeks - Titrate to 1.5 mg of Trulicity weekly at next visit - Repeat A1c at next visit

## 2023-05-13 NOTE — Telephone Encounter (Signed)
I will reiterate this to pt.

## 2023-05-18 ENCOUNTER — Telehealth: Payer: Self-pay

## 2023-05-18 NOTE — Telephone Encounter (Signed)
Received call from Chanel at [facility] regarding questions with medications.   She is asking if patient had any medication changes at last appointment on 05/13/23. Advised that per note, only medication change was for Trulicity.   Chanel asked that most recent office visit note be faxed to [facility].   Faxed to [phone].  Veronda Prude, RN

## 2023-05-19 ENCOUNTER — Telehealth (HOSPITAL_COMMUNITY): Payer: Self-pay | Admitting: *Deleted

## 2023-05-19 NOTE — Telephone Encounter (Signed)
Please review previous message. 

## 2023-05-19 NOTE — Telephone Encounter (Signed)
I know Depakote is prescribed by Neuro by Trilafon prescribed by you. Do you prefer doc at [facility] prescribe? Please advise.

## 2023-05-19 NOTE — Telephone Encounter (Signed)
As per previous message,his mother not sure if he will come back to our office. I need more clarification.

## 2023-05-19 NOTE — Telephone Encounter (Signed)
Writer spoke with mother who is requesting Depakote, Nardil, and Trilafon. All prescriptions that have been prescribed by you for some time. Pt does have an appointment with neurology on 06/14/23 so she is asking for a bridge. I have a fax number for [facility] and can send copy of scripts to them or can send to [facility]. As far as next appointment Ms. Mennen states that [facility] is going to transport Mercedes on 07/22/23 to our office for his appointment. I was not able to reach pt directly as he was on an outing per [employer]. Please review and advise.

## 2023-05-19 NOTE — Telephone Encounter (Signed)
Pt's mother called requesting a new prescription for Trilafon 16 mg be sent to either [facility] or [facility]. Please review and advise.

## 2023-05-20 ENCOUNTER — Other Ambulatory Visit (HOSPITAL_COMMUNITY): Payer: Self-pay | Admitting: *Deleted

## 2023-05-20 ENCOUNTER — Telehealth (HOSPITAL_COMMUNITY): Payer: Self-pay | Admitting: *Deleted

## 2023-05-20 ENCOUNTER — Other Ambulatory Visit (HOSPITAL_COMMUNITY): Payer: Self-pay | Admitting: Psychiatry

## 2023-05-20 DIAGNOSIS — F339 Major depressive disorder, recurrent, unspecified: Secondary | ICD-10-CM

## 2023-05-20 DIAGNOSIS — F419 Anxiety disorder, unspecified: Secondary | ICD-10-CM

## 2023-05-20 MED ORDER — PERPHENAZINE 8 MG PO TABS: 24.0000 mg | ORAL_TABLET | Freq: Every day | ORAL | 0 refills | Status: DC

## 2023-05-20 MED ORDER — PHENELZINE SULFATE 15 MG PO TABS: 45.0000 mg | ORAL_TABLET | Freq: Two times a day (BID) | ORAL | 0 refills | Status: DC

## 2023-05-20 MED ORDER — DIVALPROEX SODIUM ER 500 MG PO TB24: 500.0000 mg | ORAL_TABLET | Freq: Two times a day (BID) | ORAL | 0 refills | Status: DC

## 2023-05-20 NOTE — Telephone Encounter (Signed)
Writer spoke with staff at [facility] facility regarding sending medication orders for pt. I was advised to fax a copy of the orders to the nurse @ [phone]. Orders for Depakote, Trilafon, and Nardil faxed to facility. Pt's mother, Annabelle Harman, was advised. Pt does not have a working phone number until next week per mother.

## 2023-05-21 MED ORDER — PERPHENAZINE 8 MG PO TABS
24.0000 mg | ORAL_TABLET | Freq: Every day | ORAL | 0 refills | Status: DC
Start: 2023-05-21 — End: 2023-07-22

## 2023-05-21 MED ORDER — PHENELZINE SULFATE 15 MG PO TABS
45.0000 mg | ORAL_TABLET | Freq: Two times a day (BID) | ORAL | 0 refills | Status: DC
Start: 2023-05-21 — End: 2023-06-08

## 2023-05-21 NOTE — Telephone Encounter (Signed)
Pt is currently still at the ALF and will be for foreseeable future. I am still trying to clarify if [facility] just needs a list of current meds or do they need orders from [place]? I was told by nursing staff there that the physician there does not write for standing orders.Trying to clarify. He does have an appointment with neuro on 06/14/23. D/c Depakote? [facility] said they will transport pt to doctors appointments.

## 2023-05-21 NOTE — Telephone Encounter (Signed)
I print the telephone and Nardil.  He will need to get the Depakote from neurology because he is taking for seizures.  We must have the blood work and recent discharge summary from [facility] facility about his last admission.

## 2023-05-24 DIAGNOSIS — S129XXD Fracture of neck, unspecified, subsequent encounter: Secondary | ICD-10-CM | POA: Diagnosis not present

## 2023-05-24 DIAGNOSIS — M50222 Other cervical disc displacement at C5-C6 level: Secondary | ICD-10-CM | POA: Diagnosis not present

## 2023-05-25 DIAGNOSIS — H524 Presbyopia: Secondary | ICD-10-CM | POA: Diagnosis not present

## 2023-05-31 ENCOUNTER — Telehealth: Payer: Self-pay

## 2023-05-31 NOTE — Telephone Encounter (Signed)
Received call from Marylene Land at [facility] regarding patient. Patient is currently a resident at [facility], however, request was made for home health PT and OT.   Provided with verbal orders for PT and OT evaluations per protocol.   Veronda Prude, RN

## 2023-06-01 ENCOUNTER — Telehealth: Payer: Self-pay

## 2023-06-01 DIAGNOSIS — E119 Type 2 diabetes mellitus without complications: Secondary | ICD-10-CM | POA: Diagnosis not present

## 2023-06-01 DIAGNOSIS — E039 Hypothyroidism, unspecified: Secondary | ICD-10-CM | POA: Diagnosis not present

## 2023-06-01 DIAGNOSIS — Z7901 Long term (current) use of anticoagulants: Secondary | ICD-10-CM | POA: Diagnosis not present

## 2023-06-01 DIAGNOSIS — F259 Schizoaffective disorder, unspecified: Secondary | ICD-10-CM | POA: Diagnosis not present

## 2023-06-01 DIAGNOSIS — Z7985 Long-term (current) use of injectable non-insulin antidiabetic drugs: Secondary | ICD-10-CM | POA: Diagnosis not present

## 2023-06-01 DIAGNOSIS — G40909 Epilepsy, unspecified, not intractable, without status epilepticus: Secondary | ICD-10-CM | POA: Diagnosis not present

## 2023-06-01 DIAGNOSIS — W19XXXD Unspecified fall, subsequent encounter: Secondary | ICD-10-CM | POA: Diagnosis not present

## 2023-06-01 DIAGNOSIS — S12401D Unspecified nondisplaced fracture of fifth cervical vertebra, subsequent encounter for fracture with routine healing: Secondary | ICD-10-CM | POA: Diagnosis not present

## 2023-06-01 DIAGNOSIS — Z9181 History of falling: Secondary | ICD-10-CM | POA: Diagnosis not present

## 2023-06-01 DIAGNOSIS — F329 Major depressive disorder, single episode, unspecified: Secondary | ICD-10-CM | POA: Diagnosis not present

## 2023-06-01 DIAGNOSIS — I4891 Unspecified atrial fibrillation: Secondary | ICD-10-CM | POA: Diagnosis not present

## 2023-06-01 NOTE — Telephone Encounter (Signed)
[name] HH PT with [facility] calls nurse line requesting verbal orders for [facility] PT as follows.   1x a week for 1 week 2x a week for 3 weeks  1x a week for 3 weeks   Verbal order given.

## 2023-06-07 ENCOUNTER — Telehealth (HOSPITAL_COMMUNITY): Payer: Self-pay

## 2023-06-07 DIAGNOSIS — F339 Major depressive disorder, recurrent, unspecified: Secondary | ICD-10-CM

## 2023-06-07 DIAGNOSIS — F419 Anxiety disorder, unspecified: Secondary | ICD-10-CM

## 2023-06-07 NOTE — Telephone Encounter (Signed)
Patients mother called about his Perphenazine, patient would like to cut it back to 2 a day instead of 3 a day. Patient feels that his memory is not as good in this medication and he is sleepy.

## 2023-06-07 NOTE — Telephone Encounter (Signed)
I have not seen him since August.  He has been hospitalized and we are doing medication adjustment on the phone.  We can try cutting down the Nardil from 45 mg twice a day to 30 mg twice a day.  However his memory issue is chronic.  His appointment coming up in January but may require higher level of care due to his underlying memory issues and physical problems.  If possible please move his appointment sooner.  Thanks

## 2023-06-08 ENCOUNTER — Ambulatory Visit (INDEPENDENT_AMBULATORY_CARE_PROVIDER_SITE_OTHER): Payer: Medicare HMO | Admitting: Student

## 2023-06-08 ENCOUNTER — Encounter: Payer: Self-pay | Admitting: Student

## 2023-06-08 ENCOUNTER — Other Ambulatory Visit (HOSPITAL_COMMUNITY): Payer: Self-pay

## 2023-06-08 VITALS — BP 108/66 | HR 64 | Wt 230.0 lb

## 2023-06-08 DIAGNOSIS — G40909 Epilepsy, unspecified, not intractable, without status epilepticus: Secondary | ICD-10-CM | POA: Diagnosis not present

## 2023-06-08 DIAGNOSIS — E039 Hypothyroidism, unspecified: Secondary | ICD-10-CM

## 2023-06-08 DIAGNOSIS — F419 Anxiety disorder, unspecified: Secondary | ICD-10-CM

## 2023-06-08 DIAGNOSIS — Z7901 Long term (current) use of anticoagulants: Secondary | ICD-10-CM | POA: Diagnosis not present

## 2023-06-08 DIAGNOSIS — E119 Type 2 diabetes mellitus without complications: Secondary | ICD-10-CM | POA: Diagnosis not present

## 2023-06-08 DIAGNOSIS — Z9181 History of falling: Secondary | ICD-10-CM | POA: Diagnosis not present

## 2023-06-08 DIAGNOSIS — F329 Major depressive disorder, single episode, unspecified: Secondary | ICD-10-CM | POA: Diagnosis not present

## 2023-06-08 DIAGNOSIS — F339 Major depressive disorder, recurrent, unspecified: Secondary | ICD-10-CM

## 2023-06-08 DIAGNOSIS — F259 Schizoaffective disorder, unspecified: Secondary | ICD-10-CM | POA: Diagnosis not present

## 2023-06-08 DIAGNOSIS — I4891 Unspecified atrial fibrillation: Secondary | ICD-10-CM | POA: Diagnosis not present

## 2023-06-08 DIAGNOSIS — W19XXXD Unspecified fall, subsequent encounter: Secondary | ICD-10-CM | POA: Diagnosis not present

## 2023-06-08 DIAGNOSIS — Z7985 Long-term (current) use of injectable non-insulin antidiabetic drugs: Secondary | ICD-10-CM | POA: Diagnosis not present

## 2023-06-08 DIAGNOSIS — S12401D Unspecified nondisplaced fracture of fifth cervical vertebra, subsequent encounter for fracture with routine healing: Secondary | ICD-10-CM | POA: Diagnosis not present

## 2023-06-08 LAB — POCT GLYCOSYLATED HEMOGLOBIN (HGB A1C): HbA1c, POC (controlled diabetic range): 5.7 % (ref 0.0–7.0)

## 2023-06-08 MED ORDER — PHENELZINE SULFATE 15 MG PO TABS: 30.0000 mg | ORAL_TABLET | Freq: Two times a day (BID) | ORAL | 0 refills | Status: DC

## 2023-06-08 MED ORDER — TRULICITY 1.5 MG/0.5ML ~~LOC~~ SOAJ
1.5000 mg | SUBCUTANEOUS | 1 refills | Status: DC
Start: 2023-06-08 — End: 2023-09-13

## 2023-06-08 NOTE — Assessment & Plan Note (Signed)
Reports compliance with Trulicity 0.75 mg weekly.  A1c today of 5.7. - Increase Trulicity to 1.5 mg weekly - Prescriptions printed today due to assisted living facility's pharmacy not being in system - Follow-up in 3 months

## 2023-06-08 NOTE — Patient Instructions (Addendum)
It was great to see you! Thank you for allowing me to participate in your care!   I recommend that you always bring your medications to each appointment as this makes it easy to ensure we are on the correct medications and helps [place] not miss when refills are needed.  Our plans for today:  - Please take 1.5 mg of trulicity once per week. I have given you a paper prescription. Please call if you have any issues.  Please call: [facility] [address] [place] [phone]  We are checking some labs today, I will call you if they are abnormal will send you a MyChart message or a letter if they are normal.  If you do not hear about your labs in the next 2 weeks please let us know.  Take care and seek immediate care sooner if you develop any concerns. Please remember to show up 15 minutes before your scheduled appointment time!  Tiffany Kocher, DO [facility]

## 2023-06-08 NOTE — Progress Notes (Signed)
    SUBJECTIVE:   CHIEF COMPLAINT / HPI:   Hypothyroidism TSH of 10 at last admission.  Synthroid was increased to 125 mcg daily.  Need for repeat TSH testing today.  Patient reports compliance with medication, no side effects, asymptomatic.  Diabetes Patiently recently switched from Ozempic to Trulicity due to insurance coverage.  Reports compliance with 0.75 mg Trulicity weekly, and scheduled for dose increase next week.  Due for hemoglobin A1c.  Health maintenance Referral for colonoscopy was placed at last visit.  Patient likely missed call due to changes were number.  Phone number for GI provided.   OBJECTIVE:   BP 108/66   Pulse 64   Wt 230 lb (104.3 kg)   SpO2 97%   BMI 42.07 kg/m    General: NAD, pleasant Cardio: RRR, no MRG. Cap Refill <2s. Respiratory: CTAB, normal wob on RA Skin: Warm and dry  ASSESSMENT/PLAN:   Assessment & Plan Hypothyroidism, unspecified type Compliant with Synthroid 125 mcg daily. - Check TSH today - Continue Synthroid Type 2 diabetes mellitus without complication, without long-term current use of insulin (HCC) Reports compliance with Trulicity 0.75 mg weekly.  A1c today of 5.7. - Increase Trulicity to 1.5 mg weekly - Prescriptions printed today due to assisted living facility's pharmacy not being in system - Follow-up in 3 months   Anthony Kocher, DO [facility]

## 2023-06-09 LAB — TSH RFX ON ABNORMAL TO FREE T4: TSH: 0.785 u[IU]/mL (ref 0.450–4.500)

## 2023-06-10 DIAGNOSIS — E039 Hypothyroidism, unspecified: Secondary | ICD-10-CM | POA: Diagnosis not present

## 2023-06-10 DIAGNOSIS — G40909 Epilepsy, unspecified, not intractable, without status epilepticus: Secondary | ICD-10-CM | POA: Diagnosis not present

## 2023-06-10 DIAGNOSIS — W19XXXD Unspecified fall, subsequent encounter: Secondary | ICD-10-CM | POA: Diagnosis not present

## 2023-06-10 DIAGNOSIS — Z9181 History of falling: Secondary | ICD-10-CM | POA: Diagnosis not present

## 2023-06-10 DIAGNOSIS — F329 Major depressive disorder, single episode, unspecified: Secondary | ICD-10-CM | POA: Diagnosis not present

## 2023-06-10 DIAGNOSIS — Z7901 Long term (current) use of anticoagulants: Secondary | ICD-10-CM | POA: Diagnosis not present

## 2023-06-10 DIAGNOSIS — S12401D Unspecified nondisplaced fracture of fifth cervical vertebra, subsequent encounter for fracture with routine healing: Secondary | ICD-10-CM | POA: Diagnosis not present

## 2023-06-10 DIAGNOSIS — F259 Schizoaffective disorder, unspecified: Secondary | ICD-10-CM | POA: Diagnosis not present

## 2023-06-10 DIAGNOSIS — Z7985 Long-term (current) use of injectable non-insulin antidiabetic drugs: Secondary | ICD-10-CM | POA: Diagnosis not present

## 2023-06-10 DIAGNOSIS — I4891 Unspecified atrial fibrillation: Secondary | ICD-10-CM | POA: Diagnosis not present

## 2023-06-10 DIAGNOSIS — E119 Type 2 diabetes mellitus without complications: Secondary | ICD-10-CM | POA: Diagnosis not present

## 2023-06-14 ENCOUNTER — Telehealth (HOSPITAL_COMMUNITY): Payer: Self-pay | Admitting: *Deleted

## 2023-06-14 ENCOUNTER — Ambulatory Visit (INDEPENDENT_AMBULATORY_CARE_PROVIDER_SITE_OTHER): Payer: Medicare HMO | Admitting: Diagnostic Neuroimaging

## 2023-06-14 ENCOUNTER — Encounter: Payer: Self-pay | Admitting: Diagnostic Neuroimaging

## 2023-06-14 VITALS — BP 115/64 | HR 67 | Ht 70.0 in | Wt 225.0 lb

## 2023-06-14 DIAGNOSIS — F329 Major depressive disorder, single episode, unspecified: Secondary | ICD-10-CM | POA: Diagnosis not present

## 2023-06-14 DIAGNOSIS — S12401D Unspecified nondisplaced fracture of fifth cervical vertebra, subsequent encounter for fracture with routine healing: Secondary | ICD-10-CM | POA: Diagnosis not present

## 2023-06-14 DIAGNOSIS — R569 Unspecified convulsions: Secondary | ICD-10-CM | POA: Diagnosis not present

## 2023-06-14 DIAGNOSIS — F259 Schizoaffective disorder, unspecified: Secondary | ICD-10-CM | POA: Diagnosis not present

## 2023-06-14 DIAGNOSIS — E119 Type 2 diabetes mellitus without complications: Secondary | ICD-10-CM | POA: Diagnosis not present

## 2023-06-14 DIAGNOSIS — Z9181 History of falling: Secondary | ICD-10-CM | POA: Diagnosis not present

## 2023-06-14 DIAGNOSIS — R251 Tremor, unspecified: Secondary | ICD-10-CM

## 2023-06-14 DIAGNOSIS — G2119 Other drug induced secondary parkinsonism: Secondary | ICD-10-CM

## 2023-06-14 DIAGNOSIS — I4891 Unspecified atrial fibrillation: Secondary | ICD-10-CM | POA: Diagnosis not present

## 2023-06-14 DIAGNOSIS — E039 Hypothyroidism, unspecified: Secondary | ICD-10-CM | POA: Diagnosis not present

## 2023-06-14 DIAGNOSIS — G40909 Epilepsy, unspecified, not intractable, without status epilepticus: Secondary | ICD-10-CM | POA: Diagnosis not present

## 2023-06-14 DIAGNOSIS — Z7985 Long-term (current) use of injectable non-insulin antidiabetic drugs: Secondary | ICD-10-CM | POA: Diagnosis not present

## 2023-06-14 DIAGNOSIS — W19XXXD Unspecified fall, subsequent encounter: Secondary | ICD-10-CM | POA: Diagnosis not present

## 2023-06-14 DIAGNOSIS — Z7901 Long term (current) use of anticoagulants: Secondary | ICD-10-CM | POA: Diagnosis not present

## 2023-06-14 NOTE — Patient Instructions (Signed)
  Tremors (drug induced parkinsonism) - likely drug induced parkinsonism from perphenazine - some tremor from depakote as well - continue medications per psychiatry - check DATscan (eval drug induced parkinson's vs idiopathic parkinson's disease)

## 2023-06-14 NOTE — Progress Notes (Signed)
[facility]  PATIENT: [name] DOB: 1961-05-29  REFERRING CLINICIAN: McDiarmid, Leighton Roach, MD HISTORY FROM: patient REASON FOR VISIT: follow up   HISTORICAL  CHIEF COMPLAINT:  Chief Complaint  Patient presents with   New Patient (Initial Visit)    Pt in 6 Pt here tremors Pt states tremors in both hands Pt states right hand is worse     HISTORY OF PRESENT ILLNESS:   UPDATE (06/14/23, VRP): Since last visit, here for evaluation of tremor. Tremors started ~3 years ago or longer. Mainly resting and postural tremor. No issues with seizures since age 46 years old. Stable on depakote 500mg  twice a day. Now living at [facility] assisted living since last hospital admission for fall, confusion, c-spine fracture.   PRIOR HPI (03/05/22, VRP): 62 year old male here for evaluation of seizure disorder.  History of depression, OCD, schizoaffective disorder.  Around age 31 years old patient had some staring spells, saw neurologist and was diagnosed with temporal lobe seizures.  Started on Depakote which seemed to help.  He only had 1 or 2 more events around age 55 years old.  He has done well on Depakote for many years.  March 2023 patient had an episode of confusion, crying spells, concern for possible temporal lobe seizure.  He was referred to neurology at that time.  In retrospect patient does not think this was a seizure event but rather a depression episode.  Patient also having some issues with memory, focus, attention.  These were also evaluated in 2015, and at that time felt to be related to underlying mental health diagnoses and stress factors.  Patient lives alone.  He is independent of all ADLs.   REVIEW OF SYSTEMS: Full 14 system review of systems performed and negative with exception of: as per HPI.  ALLERGIES: Allergies  Allergen Reactions   Lamictal [Lamotrigine] Rash   Metformin And Related Rash    HOME MEDICATIONS: Outpatient Medications Prior to  Visit  Medication Sig Dispense Refill   acetaminophen (TYLENOL) 500 MG tablet Take 500-1,000 mg by mouth 2 (two) times daily as needed for mild pain.     allopurinol (ZYLOPRIM) 300 MG tablet TAKE 1 TABLET BY MOUTH EVERY DAY 90 tablet 1   apixaban (ELIQUIS) 5 MG TABS tablet Take 1 tablet (5 mg total) by mouth 2 (two) times daily. 180 tablet 2   atorvastatin (LIPITOR) 40 MG tablet TAKE 1 TABLET BY MOUTH EVERY DAY 90 tablet 1   benztropine (COGENTIN) 0.5 MG tablet Take 1 tablet (0.5 mg total) by mouth at bedtime.     calcium carbonate (TUMS - DOSED IN MG ELEMENTAL CALCIUM) 500 MG chewable tablet Chew 1 tablet (200 mg of elemental calcium total) by mouth daily as needed for indigestion or heartburn.     divalproex (DEPAKOTE ER) 500 MG 24 hr tablet Take 1 tablet (500 mg total) by mouth 2 (two) times daily. 120 tablet 0   Dulaglutide (TRULICITY) 1.5 MG/0.5ML SOAJ Inject 1.5 mg into the skin once a week. 2 mL 1   levothyroxine (SYNTHROID) 125 MCG tablet Take 1 tablet (125 mcg total) by mouth daily.     perphenazine (TRILAFON) 8 MG tablet Take 3 tablets (24 mg total) by mouth at bedtime. 180 tablet 0   phenelzine (NARDIL) 15 MG tablet Take 2 tablets (30 mg total) by mouth 2 (two) times daily. 120 tablet 0   polyethylene glycol (MIRALAX / GLYCOLAX) 17 g packet Take 34 g by mouth daily.  senna-docusate (SENOKOT-S) 8.6-50 MG tablet Take 1 tablet by mouth at bedtime.     divalproex (DEPAKOTE ER) 500 MG 24 hr tablet Take 2 tablets (1,000 mg total) by mouth at bedtime.     lidocaine (LIDODERM) 5 % Place 1 patch onto the skin daily. Remove & Discard patch within 12 hours or as directed by MD     phenol (CHLORASEPTIC) 1.4 % LIQD Use as directed 1 spray in the mouth or throat as needed for throat irritation / pain.     No facility-administered medications prior to visit.    PAST MEDICAL HISTORY: Past Medical History:  Diagnosis Date   A-fib [facility])    Acute right-sided low back pain    Arrhythmia     Chronic a-fib (HCC)    Closed fracture of left distal radius    Contact dermatitis 04/16/2017   Depression    Depression    Hypothyroidism    Low back pain    Morbid obesity, BMI unknown (HCC)    Osteoarthritis    oa in bilateral knees   Schizophrenia (HCC)    Seizures (HCC)    Thyroid disease    Varicose veins     PAST SURGICAL HISTORY: Past Surgical History:  Procedure Laterality Date   ACHILLES TENDON REPAIR  approx. 2004   right foot   ANTERIOR CERVICAL DECOMP/DISCECTOMY FUSION N/A 04/08/2023   Procedure: Anterior Cervical Discectomy Fusion Cervical Five-Cervical Six;  Surgeon: Lisbeth Renshaw, MD;  Location: [facility] OR;  Service: Neurosurgery;  Laterality: N/A;   CATARACT EXTRACTION     bilateral   OPEN REDUCTION INTERNAL FIXATION (ORIF) DISTAL RADIAL FRACTURE Left 07/21/2018   Procedure: OPEN REDUCTION INTERNAL FIXATION (ORIF) DISTAL RADIAL FRACTURE;  Surgeon: Betha Loa, MD;  Location: [facility];  Service: Orthopedics;  Laterality: Left;   stab phlebectomy  Right 11/09/2016   stab phlebectomy R leg by Josephina Gip MD   TOOTH EXTRACTION      FAMILY HISTORY: Family History  Problem Relation Age of Onset   OCD Mother    Diabetes Father    Heart disease Father    Diabetes Brother    Parkinson's disease Neg Hx    Tremor Neg Hx     SOCIAL HISTORY: Social History   Socioeconomic History   Marital status: Single    Spouse name: Not on file   Number of children: Not on file   Years of education: Not on file   Highest education level: Not on file  Occupational History   Occupation: disabled  Tobacco Use   Smoking status: Never   Smokeless tobacco: Never  Vaping Use   Vaping status: Never Used  Substance and Sexual Activity   Alcohol use: No    Alcohol/week: 0.0 standard drinks of alcohol   Drug use: No   Sexual activity: Never  Other Topics Concern   Not on file  Social History Narrative   Pt lives at [facility]    Pt  retired    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Determinants of Corporate investment banker Strain: Low Risk  (02/13/2023)   Overall Financial Resource Strain (CARDIA)    Difficulty of Paying Living Expenses: Not hard at all  Food Insecurity: No Food Insecurity (04/04/2023)   Hunger Vital Sign    Worried About Running Out of Food in the Last Year: Never true    Ran Out of Food in the Last Year: Never true  Transportation Needs: No Transportation Needs (04/04/2023)   PRAPARE - Transportation  Lack of Transportation (Medical): No    Lack of Transportation (Non-Medical): No  Physical Activity: Inactive (02/13/2023)   Exercise Vital Sign    Days of Exercise per Week: 0 days    Minutes of Exercise per Session: 0 min  Stress: No Stress Concern Present (02/13/2023)   [employer] of Occupational Health - Occupational Stress Questionnaire    Feeling of Stress : Not at all  Social Connections: Socially Isolated (02/13/2023)   Social Connection and Isolation Panel [NHANES]    Frequency of Communication with Friends and Family: More than three times a week    Frequency of Social Gatherings with Friends and Family: Three times a week    Attends Religious Services: Never    Active Member of Clubs or Organizations: No    Attends Banker Meetings: Never    Marital Status: Never married  Intimate Partner Violence: Not At Risk (04/04/2023)   Humiliation, Afraid, Rape, and Kick questionnaire    Fear of Current or Ex-Partner: No    Emotionally Abused: No    Physically Abused: No    Sexually Abused: No     PHYSICAL EXAM  GENERAL EXAM/CONSTITUTIONAL: Vitals:  Vitals:   06/14/23 0914  BP: 115/64  Pulse: 67  Weight: 225 lb (102.1 kg)  Height: 5\' 10"  (1.778 m)   Body mass index is 32.28 kg/m. Wt Readings from Last 3 Encounters:  06/14/23 225 lb (102.1 kg)  06/08/23 230 lb (104.3 kg)  05/13/23 224 lb 6.4 oz (101.8 kg)   Patient is in no distress; well developed, nourished and groomed; neck is  supple  CARDIOVASCULAR: Examination of carotid arteries is normal; no carotid bruits REGULAR RATE AND RHYTHM Examination of peripheral vascular system by observation and palpation is normal  EYES: Ophthalmoscopic exam of optic discs and posterior segments is normal; no papilledema or hemorrhages No results found.  MUSCULOSKELETAL: Gait, strength, tone, movements noted in Neurologic exam below  NEUROLOGIC: MENTAL STATUS:      No data to display         awake, alert, oriented to person, place and time recent and remote memory intact normal attention and concentration language fluent, comprehension intact, naming intact fund of knowledge appropriate  CRANIAL NERVE:  2nd - no papilledema on fundoscopic exam 2nd, 3rd, 4th, 6th - pupils equal and reactive to light, visual fields full to confrontation, extraocular muscles intact, no nystagmus 5th - facial sensation symmetric 7th - facial strength symmetric 8th - hearing intact 9th - palate elevates symmetrically, uvula midline 11th - shoulder shrug symmetric 12th - tongue protrusion midline MASKED FACIES  MOTOR:  normal bulk and tone, full strength in the BUE, BLE MILD INTERMITTENT RESTING TREMOR IN BUE AND RLE SLIGHT COGWHEELING RIGIDITY IN RUE > LUE MILD BRADYKINESIA IN BUE AND BLE  SENSORY:  normal and symmetric to light touch, temperature, vibration  COORDINATION:  finger-nose-finger, fine finger movements MILD INTENTION TREMOR L > R  REFLEXES:  deep tendon reflexes 1+ and symmetric  GAIT/STATION:  narrow based gait     DIAGNOSTIC DATA (LABS, IMAGING, TESTING) - I reviewed patient records, labs, notes, testing and imaging myself where available.  Lab Results  Component Value Date   WBC 10.2 04/12/2023   HGB 13.7 04/12/2023   HCT 41.4 04/12/2023   MCV 90.4 04/12/2023   PLT 143 (L) 04/12/2023      Component Value Date/Time   NA 136 04/12/2023 0816   NA 138 12/31/2020 1353   K 4.0 04/12/2023 0816  CL 97 (L) 04/12/2023 0816   CO2 27 04/12/2023 0816   GLUCOSE 94 04/12/2023 0816   BUN 16 04/12/2023 0816   BUN 15 12/31/2020 1353   CREATININE 0.65 04/12/2023 0816   CREATININE 0.94 07/16/2016 1446   CALCIUM 8.5 (L) 04/12/2023 0816   PROT 7.4 04/03/2023 1804   PROT 6.7 12/31/2020 1353   ALBUMIN 3.9 04/03/2023 1804   ALBUMIN 4.3 12/31/2020 1353   AST 36 04/03/2023 1804   ALT 11 04/03/2023 1804   ALKPHOS 91 04/03/2023 1804   BILITOT 1.2 04/03/2023 1804   BILITOT 0.4 12/31/2020 1353   GFRNONAA >60 04/12/2023 0816   GFRNONAA >89 07/16/2016 1446   GFRAA 103 11/02/2019 1711   GFRAA >89 07/16/2016 1446   Lab Results  Component Value Date   CHOL 126 09/08/2022   HDL 32 (L) 09/08/2022   LDLCALC 64 09/08/2022   TRIG 178 (H) 09/08/2022   CHOLHDL 3.9 09/08/2022   Lab Results  Component Value Date   HGBA1C 5.7 06/08/2023   Lab Results  Component Value Date   VITAMINB12 578 04/07/2023   Lab Results  Component Value Date   TSH 0.785 06/08/2023    12/21/21 CT head  1. No acute intracranial pathology. Moderate age-related atrophy and chronic microvascular ischemic changes. 2. No acute cervical spine fracture or subluxation.  03/23/23 CT head  1.  No evidence of an acute intracranial abnormality 2. Moderate chronic small vessel ischemic changes within the cerebral white matter. 3. Redemonstrated small chronic lacunar infarct within the anterior limb of left internal capsule. 4. Moderate-to-advanced generalized cerebral atrophy.    ASSESSMENT AND PLAN  62 y.o. year old male here with:   Dx:  1. Tremor   2. Drug-induced parkinsonism (HCC)   3. Seizures (HCC)      PLAN:  Tremors (drug induced parkinsonism) - likely drug induced parkinsonism from perphenazine - some tremor from depakote as well - continue medications per psychiatry - check DATscan (eval drug induced parkinson's vs idiopathic parkinson's disease)  History of temporal lobe seizures (dx'd age 23  years old; staring spells; last major events at age ~62 years old) - continue depakote 500mg  twice a day - unclear event in Oct 2024 (confusion, fall) - discussed tremor and weight gain side effects of depakote; however patient comfortable with depakote, and would like to stay on it long term  Memory loss / attention difficulties (since ~2013) - independent of ADLs currently; cognitive deficits could be related to underlying depression, OCD, schizoaffective disorder, seizure disorder and polypharmacy - recommend supportive care - safety / supervision issues reviewed - daily physical activity / exercise (at least 15-30 minutes) - eat more plants / vegetables - increase social activities, brain stimulation, games, puzzles, hobbies, crafts, arts, music - aim for at least 7-8 hours sleep per night (or more) - avoid smoking and alcohol - caution with medications, finances, driving, living alone  Return for pending if symptoms worsen or fail to improve, return to PCP, pending test results.    Suanne Marker, MD 06/14/2023, 10:00 AM Certified in Neurology, Neurophysiology and Neuroimaging  [facility] [address], [address] [place], [place] [address] [phone]

## 2023-06-14 NOTE — Telephone Encounter (Signed)
Please see my previous notes.  Has not seen since August. Needs appointment for medication adjustment.  If he agree we can provide with Saturday clinic appointment with a different provider.

## 2023-06-14 NOTE — Telephone Encounter (Signed)
Pt called asking about medication changes. Pt saw neurologist today and note is in the chart. Harpal says he would like to increase the Nardil 15 mg, he has been taking 30 mg BID. Pt says that he feels his OCD "has increased" and he would like to go back to Nardil 45 mg BID. Pt says this is the dose that has always helped quell his OCD. Pt has been taking the Trilafon 16 mg at bedtime but after seeing the neurologist he feels he would like to go back to the 24 mg at bedtime dose. Pt would like you to review and advise. He has a f/u with [place] scheduled on 07/21/22 and returns to [facility] in 3 months. Pt advised me that he was going to be at [facility] for approximately 2 more years. Please review.

## 2023-06-14 NOTE — Telephone Encounter (Signed)
Will do!

## 2023-06-17 ENCOUNTER — Telehealth: Payer: Self-pay | Admitting: Diagnostic Neuroimaging

## 2023-06-17 DIAGNOSIS — E119 Type 2 diabetes mellitus without complications: Secondary | ICD-10-CM | POA: Diagnosis not present

## 2023-06-17 DIAGNOSIS — G40909 Epilepsy, unspecified, not intractable, without status epilepticus: Secondary | ICD-10-CM | POA: Diagnosis not present

## 2023-06-17 DIAGNOSIS — F259 Schizoaffective disorder, unspecified: Secondary | ICD-10-CM | POA: Diagnosis not present

## 2023-06-17 DIAGNOSIS — Z7985 Long-term (current) use of injectable non-insulin antidiabetic drugs: Secondary | ICD-10-CM | POA: Diagnosis not present

## 2023-06-17 DIAGNOSIS — W19XXXD Unspecified fall, subsequent encounter: Secondary | ICD-10-CM | POA: Diagnosis not present

## 2023-06-17 DIAGNOSIS — Z7901 Long term (current) use of anticoagulants: Secondary | ICD-10-CM | POA: Diagnosis not present

## 2023-06-17 DIAGNOSIS — F329 Major depressive disorder, single episode, unspecified: Secondary | ICD-10-CM | POA: Diagnosis not present

## 2023-06-17 DIAGNOSIS — I4891 Unspecified atrial fibrillation: Secondary | ICD-10-CM | POA: Diagnosis not present

## 2023-06-17 DIAGNOSIS — S12401D Unspecified nondisplaced fracture of fifth cervical vertebra, subsequent encounter for fracture with routine healing: Secondary | ICD-10-CM | POA: Diagnosis not present

## 2023-06-17 DIAGNOSIS — Z9181 History of falling: Secondary | ICD-10-CM | POA: Diagnosis not present

## 2023-06-17 DIAGNOSIS — E039 Hypothyroidism, unspecified: Secondary | ICD-10-CM | POA: Diagnosis not present

## 2023-06-17 NOTE — Telephone Encounter (Signed)
no auth required, sent to [facility] nuclear medicine [phone]

## 2023-06-22 DIAGNOSIS — Z7985 Long-term (current) use of injectable non-insulin antidiabetic drugs: Secondary | ICD-10-CM | POA: Diagnosis not present

## 2023-06-22 DIAGNOSIS — G40909 Epilepsy, unspecified, not intractable, without status epilepticus: Secondary | ICD-10-CM | POA: Diagnosis not present

## 2023-06-22 DIAGNOSIS — S12401D Unspecified nondisplaced fracture of fifth cervical vertebra, subsequent encounter for fracture with routine healing: Secondary | ICD-10-CM | POA: Diagnosis not present

## 2023-06-22 DIAGNOSIS — E039 Hypothyroidism, unspecified: Secondary | ICD-10-CM | POA: Diagnosis not present

## 2023-06-22 DIAGNOSIS — Z9181 History of falling: Secondary | ICD-10-CM | POA: Diagnosis not present

## 2023-06-22 DIAGNOSIS — F329 Major depressive disorder, single episode, unspecified: Secondary | ICD-10-CM | POA: Diagnosis not present

## 2023-06-22 DIAGNOSIS — W19XXXD Unspecified fall, subsequent encounter: Secondary | ICD-10-CM | POA: Diagnosis not present

## 2023-06-22 DIAGNOSIS — F259 Schizoaffective disorder, unspecified: Secondary | ICD-10-CM | POA: Diagnosis not present

## 2023-06-22 DIAGNOSIS — I4891 Unspecified atrial fibrillation: Secondary | ICD-10-CM | POA: Diagnosis not present

## 2023-06-22 DIAGNOSIS — Z7901 Long term (current) use of anticoagulants: Secondary | ICD-10-CM | POA: Diagnosis not present

## 2023-06-22 DIAGNOSIS — E119 Type 2 diabetes mellitus without complications: Secondary | ICD-10-CM | POA: Diagnosis not present

## 2023-06-24 DIAGNOSIS — I4891 Unspecified atrial fibrillation: Secondary | ICD-10-CM | POA: Diagnosis not present

## 2023-06-24 DIAGNOSIS — F259 Schizoaffective disorder, unspecified: Secondary | ICD-10-CM | POA: Diagnosis not present

## 2023-06-24 DIAGNOSIS — E119 Type 2 diabetes mellitus without complications: Secondary | ICD-10-CM | POA: Diagnosis not present

## 2023-06-24 DIAGNOSIS — F329 Major depressive disorder, single episode, unspecified: Secondary | ICD-10-CM | POA: Diagnosis not present

## 2023-06-24 DIAGNOSIS — G40909 Epilepsy, unspecified, not intractable, without status epilepticus: Secondary | ICD-10-CM | POA: Diagnosis not present

## 2023-06-24 DIAGNOSIS — E039 Hypothyroidism, unspecified: Secondary | ICD-10-CM | POA: Diagnosis not present

## 2023-06-24 DIAGNOSIS — Z9181 History of falling: Secondary | ICD-10-CM | POA: Diagnosis not present

## 2023-06-24 DIAGNOSIS — W19XXXD Unspecified fall, subsequent encounter: Secondary | ICD-10-CM | POA: Diagnosis not present

## 2023-06-24 DIAGNOSIS — S12401D Unspecified nondisplaced fracture of fifth cervical vertebra, subsequent encounter for fracture with routine healing: Secondary | ICD-10-CM | POA: Diagnosis not present

## 2023-06-24 DIAGNOSIS — Z7985 Long-term (current) use of injectable non-insulin antidiabetic drugs: Secondary | ICD-10-CM | POA: Diagnosis not present

## 2023-06-24 DIAGNOSIS — Z7901 Long term (current) use of anticoagulants: Secondary | ICD-10-CM | POA: Diagnosis not present

## 2023-07-08 DIAGNOSIS — G40909 Epilepsy, unspecified, not intractable, without status epilepticus: Secondary | ICD-10-CM | POA: Diagnosis not present

## 2023-07-08 DIAGNOSIS — E119 Type 2 diabetes mellitus without complications: Secondary | ICD-10-CM | POA: Diagnosis not present

## 2023-07-08 DIAGNOSIS — Z9181 History of falling: Secondary | ICD-10-CM | POA: Diagnosis not present

## 2023-07-08 DIAGNOSIS — F259 Schizoaffective disorder, unspecified: Secondary | ICD-10-CM | POA: Diagnosis not present

## 2023-07-08 DIAGNOSIS — I4891 Unspecified atrial fibrillation: Secondary | ICD-10-CM | POA: Diagnosis not present

## 2023-07-08 DIAGNOSIS — Z7985 Long-term (current) use of injectable non-insulin antidiabetic drugs: Secondary | ICD-10-CM | POA: Diagnosis not present

## 2023-07-08 DIAGNOSIS — W19XXXD Unspecified fall, subsequent encounter: Secondary | ICD-10-CM | POA: Diagnosis not present

## 2023-07-08 DIAGNOSIS — F329 Major depressive disorder, single episode, unspecified: Secondary | ICD-10-CM | POA: Diagnosis not present

## 2023-07-08 DIAGNOSIS — S12401D Unspecified nondisplaced fracture of fifth cervical vertebra, subsequent encounter for fracture with routine healing: Secondary | ICD-10-CM | POA: Diagnosis not present

## 2023-07-08 DIAGNOSIS — Z7901 Long term (current) use of anticoagulants: Secondary | ICD-10-CM | POA: Diagnosis not present

## 2023-07-08 DIAGNOSIS — E039 Hypothyroidism, unspecified: Secondary | ICD-10-CM | POA: Diagnosis not present

## 2023-07-13 ENCOUNTER — Ambulatory Visit: Payer: Medicare HMO | Admitting: Student

## 2023-07-13 DIAGNOSIS — F259 Schizoaffective disorder, unspecified: Secondary | ICD-10-CM | POA: Diagnosis not present

## 2023-07-13 DIAGNOSIS — S12401D Unspecified nondisplaced fracture of fifth cervical vertebra, subsequent encounter for fracture with routine healing: Secondary | ICD-10-CM | POA: Diagnosis not present

## 2023-07-13 DIAGNOSIS — E039 Hypothyroidism, unspecified: Secondary | ICD-10-CM | POA: Diagnosis not present

## 2023-07-13 DIAGNOSIS — Z9181 History of falling: Secondary | ICD-10-CM | POA: Diagnosis not present

## 2023-07-13 DIAGNOSIS — W19XXXA Unspecified fall, initial encounter: Secondary | ICD-10-CM

## 2023-07-13 DIAGNOSIS — E119 Type 2 diabetes mellitus without complications: Secondary | ICD-10-CM | POA: Diagnosis not present

## 2023-07-13 DIAGNOSIS — I4891 Unspecified atrial fibrillation: Secondary | ICD-10-CM | POA: Diagnosis not present

## 2023-07-13 DIAGNOSIS — Z7985 Long-term (current) use of injectable non-insulin antidiabetic drugs: Secondary | ICD-10-CM | POA: Diagnosis not present

## 2023-07-13 DIAGNOSIS — R1310 Dysphagia, unspecified: Secondary | ICD-10-CM

## 2023-07-13 DIAGNOSIS — W19XXXD Unspecified fall, subsequent encounter: Secondary | ICD-10-CM | POA: Diagnosis not present

## 2023-07-13 DIAGNOSIS — G40909 Epilepsy, unspecified, not intractable, without status epilepticus: Secondary | ICD-10-CM | POA: Diagnosis not present

## 2023-07-13 DIAGNOSIS — F329 Major depressive disorder, single episode, unspecified: Secondary | ICD-10-CM | POA: Diagnosis not present

## 2023-07-13 DIAGNOSIS — Z7901 Long term (current) use of anticoagulants: Secondary | ICD-10-CM | POA: Diagnosis not present

## 2023-07-13 MED ORDER — OMEPRAZOLE 40 MG PO CPDR
40.0000 mg | DELAYED_RELEASE_CAPSULE | Freq: Every day | ORAL | 3 refills | Status: DC
Start: 2023-07-13 — End: 2024-03-02

## 2023-07-13 NOTE — Progress Notes (Signed)
    SUBJECTIVE:   CHIEF COMPLAINT / HPI:   Fall -rolled out of bed from nightmare, 3 weeks ago -Abrasion to knee -nurse providing care -Nurse saw today and replace bandage, no problems  Dysphagia -Has appointment for colonoscopy pre-appt in 3 weeks.  -Reports father died of esophageal cancer, and mother has Barrett's esophagus -Feels pills + certain foods get stuck, multiple occasions coughed up what he swallows. Liquids are fine. -Having reflux, worse at night -Not currently on medications   T2DM Wants to go back on Ozempic .  He is concerned he is gaining weight with Trulicity .  OBJECTIVE:   There were no vitals taken for this visit.   General: NAD, pleasant Cardio: RRR, no MRG. Cap Refill <2s. Respiratory: CTAB, normal wob on RA GI: Abdomen is soft, not tender, not distended. BS present Skin: Warm and dry. Knee abrasion has bandage. No surrounding erythema, and warmth.   ASSESSMENT/PLAN:   Assessment & Plan Dysphagia, unspecified type Reflux symptoms, worse at night.  Dysphagia, sensation of pills/food getting stuck, occasional regurgitation.  Family history of esophageal cancer and Barrett's esophagus.  Not currently on PPI, but does take Tums. - Start omeprazole  40 mg daily - Referral to GI for dysphagia workup, likely needs EGD Fall, initial encounter Uncomplicated fall with small abrasion to left knee 3 weeks ago. Left knee is healing.  He is ambulating, with normal mentation. -Follow-up if symptoms worsen or fail to improve  Follow-up recommendations Patient requesting switch back to Ozempic , on review there is apparently issues with Ozempic  being covered by insurance.  He is concerned that he is gaining weight on Trulicity , however he is lost 5 pounds since her last visit.  We will discuss further at his next follow-up in March.   Anthony Church, DO [facility]

## 2023-07-13 NOTE — Patient Instructions (Signed)
 It was great to see you! Thank you for allowing me to participate in your care!   I recommend that you always bring your medications to each appointment as this makes it easy to ensure we are on the correct medications and helps us  not miss when refills are needed.  Our plans for today:  -Please take 40 mg of Prilosec once daily until seen by GI -I have sent a referral for another appointment with GI to discuss the sensation of food getting stuck in your throat   Take care and seek immediate care sooner if you develop any concerns. Please remember to show up 15 minutes before your scheduled appointment time!  [name], DO [facility]

## 2023-07-14 DIAGNOSIS — Z9181 History of falling: Secondary | ICD-10-CM | POA: Diagnosis not present

## 2023-07-14 DIAGNOSIS — I4891 Unspecified atrial fibrillation: Secondary | ICD-10-CM | POA: Diagnosis not present

## 2023-07-14 DIAGNOSIS — Z7901 Long term (current) use of anticoagulants: Secondary | ICD-10-CM | POA: Diagnosis not present

## 2023-07-14 DIAGNOSIS — G40909 Epilepsy, unspecified, not intractable, without status epilepticus: Secondary | ICD-10-CM | POA: Diagnosis not present

## 2023-07-14 DIAGNOSIS — E119 Type 2 diabetes mellitus without complications: Secondary | ICD-10-CM | POA: Diagnosis not present

## 2023-07-14 DIAGNOSIS — F329 Major depressive disorder, single episode, unspecified: Secondary | ICD-10-CM | POA: Diagnosis not present

## 2023-07-14 DIAGNOSIS — Z7985 Long-term (current) use of injectable non-insulin antidiabetic drugs: Secondary | ICD-10-CM | POA: Diagnosis not present

## 2023-07-14 DIAGNOSIS — F259 Schizoaffective disorder, unspecified: Secondary | ICD-10-CM | POA: Diagnosis not present

## 2023-07-14 DIAGNOSIS — W19XXXD Unspecified fall, subsequent encounter: Secondary | ICD-10-CM | POA: Diagnosis not present

## 2023-07-14 DIAGNOSIS — E039 Hypothyroidism, unspecified: Secondary | ICD-10-CM | POA: Diagnosis not present

## 2023-07-14 DIAGNOSIS — S12401D Unspecified nondisplaced fracture of fifth cervical vertebra, subsequent encounter for fracture with routine healing: Secondary | ICD-10-CM | POA: Diagnosis not present

## 2023-07-16 DIAGNOSIS — S12401D Unspecified nondisplaced fracture of fifth cervical vertebra, subsequent encounter for fracture with routine healing: Secondary | ICD-10-CM | POA: Diagnosis not present

## 2023-07-16 DIAGNOSIS — F259 Schizoaffective disorder, unspecified: Secondary | ICD-10-CM | POA: Diagnosis not present

## 2023-07-16 DIAGNOSIS — E039 Hypothyroidism, unspecified: Secondary | ICD-10-CM | POA: Diagnosis not present

## 2023-07-16 DIAGNOSIS — Z7901 Long term (current) use of anticoagulants: Secondary | ICD-10-CM | POA: Diagnosis not present

## 2023-07-16 DIAGNOSIS — Z9181 History of falling: Secondary | ICD-10-CM | POA: Diagnosis not present

## 2023-07-16 DIAGNOSIS — I4891 Unspecified atrial fibrillation: Secondary | ICD-10-CM | POA: Diagnosis not present

## 2023-07-16 DIAGNOSIS — G40909 Epilepsy, unspecified, not intractable, without status epilepticus: Secondary | ICD-10-CM | POA: Diagnosis not present

## 2023-07-16 DIAGNOSIS — F329 Major depressive disorder, single episode, unspecified: Secondary | ICD-10-CM | POA: Diagnosis not present

## 2023-07-16 DIAGNOSIS — E119 Type 2 diabetes mellitus without complications: Secondary | ICD-10-CM | POA: Diagnosis not present

## 2023-07-16 DIAGNOSIS — Z7985 Long-term (current) use of injectable non-insulin antidiabetic drugs: Secondary | ICD-10-CM | POA: Diagnosis not present

## 2023-07-16 DIAGNOSIS — W19XXXD Unspecified fall, subsequent encounter: Secondary | ICD-10-CM | POA: Diagnosis not present

## 2023-07-19 DIAGNOSIS — Z7901 Long term (current) use of anticoagulants: Secondary | ICD-10-CM | POA: Diagnosis not present

## 2023-07-19 DIAGNOSIS — G40909 Epilepsy, unspecified, not intractable, without status epilepticus: Secondary | ICD-10-CM | POA: Diagnosis not present

## 2023-07-19 DIAGNOSIS — F329 Major depressive disorder, single episode, unspecified: Secondary | ICD-10-CM | POA: Diagnosis not present

## 2023-07-19 DIAGNOSIS — W19XXXD Unspecified fall, subsequent encounter: Secondary | ICD-10-CM | POA: Diagnosis not present

## 2023-07-19 DIAGNOSIS — E039 Hypothyroidism, unspecified: Secondary | ICD-10-CM | POA: Diagnosis not present

## 2023-07-19 DIAGNOSIS — S12401D Unspecified nondisplaced fracture of fifth cervical vertebra, subsequent encounter for fracture with routine healing: Secondary | ICD-10-CM | POA: Diagnosis not present

## 2023-07-19 DIAGNOSIS — E119 Type 2 diabetes mellitus without complications: Secondary | ICD-10-CM | POA: Diagnosis not present

## 2023-07-19 DIAGNOSIS — F259 Schizoaffective disorder, unspecified: Secondary | ICD-10-CM | POA: Diagnosis not present

## 2023-07-19 DIAGNOSIS — Z9181 History of falling: Secondary | ICD-10-CM | POA: Diagnosis not present

## 2023-07-19 DIAGNOSIS — Z7985 Long-term (current) use of injectable non-insulin antidiabetic drugs: Secondary | ICD-10-CM | POA: Diagnosis not present

## 2023-07-19 DIAGNOSIS — I4891 Unspecified atrial fibrillation: Secondary | ICD-10-CM | POA: Diagnosis not present

## 2023-07-22 ENCOUNTER — Ambulatory Visit (HOSPITAL_BASED_OUTPATIENT_CLINIC_OR_DEPARTMENT_OTHER): Payer: Medicare HMO | Admitting: Psychiatry

## 2023-07-22 ENCOUNTER — Encounter (HOSPITAL_COMMUNITY): Payer: Self-pay | Admitting: Psychiatry

## 2023-07-22 ENCOUNTER — Other Ambulatory Visit: Payer: Self-pay

## 2023-07-22 VITALS — BP 134/83 | HR 85 | Ht 70.5 in | Wt 227.0 lb

## 2023-07-22 DIAGNOSIS — R251 Tremor, unspecified: Secondary | ICD-10-CM

## 2023-07-22 DIAGNOSIS — F419 Anxiety disorder, unspecified: Secondary | ICD-10-CM

## 2023-07-22 DIAGNOSIS — F339 Major depressive disorder, recurrent, unspecified: Secondary | ICD-10-CM

## 2023-07-22 MED ORDER — DIVALPROEX SODIUM ER 500 MG PO TB24
500.0000 mg | ORAL_TABLET | Freq: Two times a day (BID) | ORAL | 1 refills | Status: DC
Start: 2023-07-22 — End: 2023-09-02

## 2023-07-22 MED ORDER — PHENELZINE SULFATE 15 MG PO TABS: 45.0000 mg | ORAL_TABLET | Freq: Two times a day (BID) | ORAL | 1 refills | Status: DC

## 2023-07-22 MED ORDER — BENZTROPINE MESYLATE 0.5 MG PO TABS: 0.5000 mg | ORAL_TABLET | Freq: Two times a day (BID) | ORAL | 1 refills | Status: DC

## 2023-07-22 MED ORDER — PERPHENAZINE 8 MG PO TABS
24.0000 mg | ORAL_TABLET | Freq: Every day | ORAL | 1 refills | Status: DC
Start: 2023-07-22 — End: 2023-09-02

## 2023-07-22 NOTE — Progress Notes (Signed)
BH MD/PA/NP OP Progress Note  Patient Location: Office Provider Location: Office  07/22/2023 10:24 AM [name]  MRN:  [id number]  Chief Complaint:  Chief Complaint  Patient presents with   Follow-up   HPI: Patient came for his follow-up appointment.  He was last seen in August.  Patient told he had a fall after he is rule out from his bed.  He was taken to the hospital and found to have fracture in his spine.  After that he required rehabilitation and now he is at [facility] assisted [facility].  He is not happy with the place.  He does not like people around him.  He feel his brother and the mother had destroyed his life putting him in that place.  He was enjoying his own place before but now he is dependent on the facility rules and regulation.  He admitted lately very irritable, angry, frustrated, depressed and sad.  He is also not happy that his medicines are not accurate.  He feels he is the youngest person there.  He is not able to make any friends.  He feel people they are very old and disconnected.  He also mentioned he can only take showers twice a week.  He like to go back on his Nardil 45 mg twice a day which was working for him.  Since dose cut down he really feels slipping back into severe depression, anxiety and irritability.  He is not sure how long he has to stay this place or may be forever until he died.  He admitted having issues with his mother and brother who are not agreeing to let him go to his own place.  Patient has a lot of health issues including tremors, diabetes but managed.  He did took Ozempic for a while and able to lose weight.  Now he is taking Trulicity and is not happy about it.  He has issues with attention concentration and sometime forgetful.  He was seen by neurology for his tremors but no new medication added.  It was believed it is drug induced Parkinson but definite test he will in process.  He is eating okay.  He is pleased with his hemoglobin A1c which is  now 5.7.  His labs are better.  His last Depakote level was 88 which was done in September 30.  He is concerned about his tremors but realize that it will not change since he has these tremors for a while.  He reported he was doing much better when he was taking the full dose of Nardil along with Trilafon.  He is taking Depakote for seizures.  He denies any aggression, violence, suicidal thoughts.  Denies any homicidal thoughts.  Visit Diagnosis:    ICD-10-CM   1. MDD (major depressive disorder), recurrent episode, with atypical features (HCC)  F33.9 divalproex (DEPAKOTE ER) 500 MG 24 hr tablet    perphenazine (TRILAFON) 8 MG tablet    2. Anxiety  F41.9     3. Tremor  R25.1       Past Psychiatric History: Reviewed H/O multiple inpatient from [id number] for severe depression, suicidal thoughts. No h/o suicidal attempt. H/O OCD, schizoaffective disorder and major depression.  Tried numerous medication including TCAs.  We tried Lamictal but he develop a rash.   Past Medical History:  Past Medical History:  Diagnosis Date   A-fib (HCC)    Acute right-sided low back pain    Arrhythmia    Chronic a-fib (HCC)  Closed fracture of left distal radius    Contact dermatitis 04/16/2017   Depression    Depression    Hypothyroidism    Low back pain    Morbid obesity, BMI unknown (HCC)    Osteoarthritis    oa in bilateral knees   Schizophrenia (HCC)    Seizures (HCC)    Thyroid disease    Varicose veins     Past Surgical History:  Procedure Laterality Date   ACHILLES TENDON REPAIR  approx. 2004   right foot   ANTERIOR CERVICAL DECOMP/DISCECTOMY FUSION N/A 04/08/2023   Procedure: Anterior Cervical Discectomy Fusion Cervical Five-Cervical Six;  Surgeon: Lisbeth Renshaw, MD;  Location: [facility] OR;  Service: Neurosurgery;  Laterality: N/A;   CATARACT EXTRACTION     bilateral   OPEN REDUCTION INTERNAL FIXATION (ORIF) DISTAL RADIAL FRACTURE Left 07/21/2018   Procedure: OPEN REDUCTION INTERNAL  FIXATION (ORIF) DISTAL RADIAL FRACTURE;  Surgeon: Betha Loa, MD;  Location: [facility];  Service: Orthopedics;  Laterality: Left;   stab phlebectomy  Right 11/09/2016   stab phlebectomy R leg by Josephina Gip MD   TOOTH EXTRACTION      Family Psychiatric History: Reviewed  Family History:  Family History  Problem Relation Age of Onset   OCD Mother    Diabetes Father    Heart disease Father    Diabetes Brother    Parkinson's disease Neg Hx    Tremor Neg Hx     Social History:  Social History   Socioeconomic History   Marital status: Single    Spouse name: Not on file   Number of children: Not on file   Years of education: Not on file   Highest education level: Not on file  Occupational History   Occupation: disabled  Tobacco Use   Smoking status: Never   Smokeless tobacco: Never  Vaping Use   Vaping status: Never Used  Substance and Sexual Activity   Alcohol use: No    Alcohol/week: 0.0 standard drinks of alcohol   Drug use: No   Sexual activity: Never  Other Topics Concern   Not on file  Social History Narrative   Pt lives at [facility]    Pt retired    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: Low Risk  (02/13/2023)   Overall Financial Resource Strain (CARDIA)    Difficulty of Paying Living Expenses: Not hard at all  Food Insecurity: No Food Insecurity (04/04/2023)   Hunger Vital Sign    Worried About Running Out of Food in the Last Year: Never true    Ran Out of Food in the Last Year: Never true  Transportation Needs: No Transportation Needs (04/04/2023)   PRAPARE - Administrator, Civil Service (Medical): No    Lack of Transportation (Non-Medical): No  Physical Activity: Inactive (02/13/2023)   Exercise Vital Sign    Days of Exercise per Week: 0 days    Minutes of Exercise per Session: 0 min  Stress: No Stress Concern Present (02/13/2023)   [employer] of Occupational Health - Occupational  Stress Questionnaire    Feeling of Stress : Not at all  Social Connections: Socially Isolated (02/13/2023)   Social Connection and Isolation Panel [NHANES]    Frequency of Communication with Friends and Family: More than three times a week    Frequency of Social Gatherings with Friends and Family: Three times a week    Attends Religious Services: Never    Active  Member of Clubs or Organizations: No    Attends Banker Meetings: Never    Marital Status: Never married    Allergies:  Allergies  Allergen Reactions   Lamictal [Lamotrigine] Rash   Metformin And Related Rash    Metabolic Disorder Labs: Lab Results  Component Value Date   HGBA1C 5.7 06/08/2023   MPG 136.98 04/04/2023   MPG 146 (H) 04/24/2015   No results found for: "PROLACTIN" Lab Results  Component Value Date   CHOL 126 09/08/2022   TRIG 178 (H) 09/08/2022   HDL 32 (L) 09/08/2022   CHOLHDL 3.9 09/08/2022   VLDL 60 (H) 07/16/2016   LDLCALC 64 09/08/2022   LDLCALC 113 (H) 12/18/2020   Lab Results  Component Value Date   TSH 0.785 06/08/2023   TSH 10.182 (H) 04/04/2023    Therapeutic Level Labs: No results found for: "LITHIUM" Lab Results  Component Value Date   VALPROATE 88 04/05/2023   VALPROATE 120 (H) 04/02/2023   No results found for: "CBMZ"  Current Medications: Current Outpatient Medications  Medication Sig Dispense Refill   acetaminophen (TYLENOL) 500 MG tablet Take 500-1,000 mg by mouth 2 (two) times daily as needed for mild pain.     allopurinol (ZYLOPRIM) 300 MG tablet TAKE 1 TABLET BY MOUTH EVERY DAY 90 tablet 1   apixaban (ELIQUIS) 5 MG TABS tablet Take 1 tablet (5 mg total) by mouth 2 (two) times daily. 180 tablet 2   atorvastatin (LIPITOR) 40 MG tablet TAKE 1 TABLET BY MOUTH EVERY DAY 90 tablet 1   benztropine (COGENTIN) 0.5 MG tablet Take 1 tablet (0.5 mg total) by mouth at bedtime.     calcium carbonate (TUMS - DOSED IN MG ELEMENTAL CALCIUM) 500 MG chewable tablet Chew 1  tablet (200 mg of elemental calcium total) by mouth daily as needed for indigestion or heartburn.     divalproex (DEPAKOTE ER) 500 MG 24 hr tablet Take 1 tablet (500 mg total) by mouth 2 (two) times daily. 120 tablet 0   Dulaglutide (TRULICITY) 1.5 MG/0.5ML SOAJ Inject 1.5 mg into the skin once a week. 2 mL 1   levothyroxine (SYNTHROID) 125 MCG tablet Take 1 tablet (125 mcg total) by mouth daily.     omeprazole (PRILOSEC) 40 MG capsule Take 1 capsule (40 mg total) by mouth daily. 30 capsule 3   perphenazine (TRILAFON) 8 MG tablet Take 3 tablets (24 mg total) by mouth at bedtime. (Patient taking differently: Take 16 mg by mouth at bedtime.) 180 tablet 0   phenelzine (NARDIL) 15 MG tablet Take 2 tablets (30 mg total) by mouth 2 (two) times daily. 120 tablet 0   polyethylene glycol (MIRALAX / GLYCOLAX) 17 g packet Take 34 g by mouth daily.     senna-docusate (SENOKOT-S) 8.6-50 MG tablet Take 1 tablet by mouth at bedtime.     No current facility-administered medications for this visit.     Musculoskeletal: Strength & Muscle Tone: decreased Gait & Station: unsteady, but able to walk without help Patient leans: N/A  Psychiatric Specialty Exam: Review of Systems  Constitutional:  Positive for appetite change.  Psychiatric/Behavioral:  Positive for decreased concentration and dysphoric mood. The patient is nervous/anxious.     Blood pressure 134/83, pulse 85, height 5' 10.5" (1.791 m), weight 227 lb (103 kg).Body mass index is 32.11 kg/m.  General Appearance: Casual  Eye Contact:  Fair  Speech:  Slow  Volume:  Decreased  Mood:  Anxious, Depressed, Dysphoric, and Irritable  Affect:  Constricted and Depressed  Thought Process:  Descriptions of Associations: Intact  Orientation:  Full (Time, Place, and Person)  Thought Content: Rumination   Suicidal Thoughts:  No  Homicidal Thoughts:  No  Memory:  Immediate;   Fair Recent;   Fair Remote;   Fair  Judgement:  Intact  Insight:  Shallow   Psychomotor Activity:  Decreased and Tremor  Concentration:  Concentration: Fair and Attention Span: Fair  Recall:  [employer] of Knowledge: Fair  Language: Fair  Akathisia:  No  Handed:  Right  AIMS (if indicated): not done  Assets:  Communication Skills Desire for Improvement Housing  ADL's:  Intact  Cognition: WNL  Sleep:  Fair   Screenings: CAGE-AID    Flowsheet Row ED to Hosp-Admission (Discharged) from 04/03/2023 in [facility]  CAGE-AID Score 0      PHQ2-9    Flowsheet Row Office Visit from 07/13/2023 in [facility] - [facility] Dept Of [place]. [facility] Office Visit from 06/08/2023 in [facility] - [facility] Dept Of [place]. [facility] Office Visit from 02/19/2023 in [facility] - A Dept Of [facility]. [facility] Clinical Support from 02/12/2023 in [facility] - A Dept Of [place]. [facility] Office Visit from 12/30/2022 in [facility] - A Dept Of [place]. [facility]  PHQ-2 Total Score 0 0 0 0 0  PHQ-9 Total Score 4 3 2 2 2       Flowsheet Row ED to Hosp-Admission (Discharged) from 04/03/2023 in [facility] ED from 04/02/2023 in [facility] Emergency Department at [facility] ED from 12/21/2021 in [facility] Emergency Department at [facility]  C-SSRS RISK CATEGORY No Risk No Risk No Risk        Assessment and Plan: I review notes from recent hospitalization.  He was seen by consultation liaison service while he was in the hospital.  I reviewed notes from other provider including neurology and review blood work results.  He is not happy with his current living situation and admitted lot of irritability, frustration, depression.  Like to go back to his previous dose of Nardil 45 mg twice a day along with Depakote 500 mg twice a day and Trilafon 24 mg at bedtime.  He also not happy his Cogentin dose was decreased  and only taking 0.5.  He like to go back to 1 mg.  I had a long discussion with the patient about pros and cons of the medication dosage and side effects.  He mentioned that he has been taking these medication for many years and so far no major concern.  Now the dose reduced and he feels worsening into depression.  I will increase his Nardil from 60 mg a day to 90 mg a day, increase Cogentin 1 mg a day and Depakote remain the same 5 mg twice a day and Trilafon 24 a day.  He is hoping with this medication adjustment his depression and tremors get better.  I encouraged to have definite test for the Parkinson.  He does not want to change or try a different medications.  His Depakote level is 88 which was done in September.  We will follow-up in 4 to 6 weeks in person.  However I recommend to call us back if he has any question or any concern he can  call us sooner.  Collaboration of Care: Collaboration of Care: Other provider involved in patient's care AEB notes are available in epic to review  Patient/Guardian was advised Release of Information must be obtained prior to any record release in order to collaborate their care with an outside provider. Patient/Guardian was advised if they have not already done so to contact the registration department to sign all necessary forms in order for [place] to release information regarding their care.   Consent: Patient/Guardian gives verbal consent for treatment and assignment of benefits for services provided during this visit. Patient/Guardian expressed understanding and agreed to proceed.   I provided 28 minutes face-to-face time to the patient during this encounter.  Cleotis Nipper, MD 07/22/2023, 10:24 AM

## 2023-07-27 ENCOUNTER — Other Ambulatory Visit (HOSPITAL_COMMUNITY): Payer: Self-pay | Admitting: Psychiatry

## 2023-07-27 DIAGNOSIS — F339 Major depressive disorder, recurrent, unspecified: Secondary | ICD-10-CM

## 2023-07-27 DIAGNOSIS — F419 Anxiety disorder, unspecified: Secondary | ICD-10-CM

## 2023-07-29 DIAGNOSIS — F329 Major depressive disorder, single episode, unspecified: Secondary | ICD-10-CM | POA: Diagnosis not present

## 2023-07-29 DIAGNOSIS — E119 Type 2 diabetes mellitus without complications: Secondary | ICD-10-CM | POA: Diagnosis not present

## 2023-07-29 DIAGNOSIS — I4891 Unspecified atrial fibrillation: Secondary | ICD-10-CM | POA: Diagnosis not present

## 2023-07-29 DIAGNOSIS — E039 Hypothyroidism, unspecified: Secondary | ICD-10-CM | POA: Diagnosis not present

## 2023-07-29 DIAGNOSIS — W19XXXD Unspecified fall, subsequent encounter: Secondary | ICD-10-CM | POA: Diagnosis not present

## 2023-07-29 DIAGNOSIS — Z7985 Long-term (current) use of injectable non-insulin antidiabetic drugs: Secondary | ICD-10-CM | POA: Diagnosis not present

## 2023-07-29 DIAGNOSIS — Z9181 History of falling: Secondary | ICD-10-CM | POA: Diagnosis not present

## 2023-07-29 DIAGNOSIS — F259 Schizoaffective disorder, unspecified: Secondary | ICD-10-CM | POA: Diagnosis not present

## 2023-07-29 DIAGNOSIS — S12401D Unspecified nondisplaced fracture of fifth cervical vertebra, subsequent encounter for fracture with routine healing: Secondary | ICD-10-CM | POA: Diagnosis not present

## 2023-07-29 DIAGNOSIS — G40909 Epilepsy, unspecified, not intractable, without status epilepticus: Secondary | ICD-10-CM | POA: Diagnosis not present

## 2023-07-29 DIAGNOSIS — Z7901 Long term (current) use of anticoagulants: Secondary | ICD-10-CM | POA: Diagnosis not present

## 2023-07-30 DIAGNOSIS — E039 Hypothyroidism, unspecified: Secondary | ICD-10-CM | POA: Diagnosis not present

## 2023-07-30 DIAGNOSIS — F259 Schizoaffective disorder, unspecified: Secondary | ICD-10-CM | POA: Diagnosis not present

## 2023-07-30 DIAGNOSIS — Z7985 Long-term (current) use of injectable non-insulin antidiabetic drugs: Secondary | ICD-10-CM | POA: Diagnosis not present

## 2023-07-30 DIAGNOSIS — G40909 Epilepsy, unspecified, not intractable, without status epilepticus: Secondary | ICD-10-CM | POA: Diagnosis not present

## 2023-07-30 DIAGNOSIS — E119 Type 2 diabetes mellitus without complications: Secondary | ICD-10-CM | POA: Diagnosis not present

## 2023-07-30 DIAGNOSIS — I4891 Unspecified atrial fibrillation: Secondary | ICD-10-CM | POA: Diagnosis not present

## 2023-07-30 DIAGNOSIS — F329 Major depressive disorder, single episode, unspecified: Secondary | ICD-10-CM | POA: Diagnosis not present

## 2023-07-30 DIAGNOSIS — Z7901 Long term (current) use of anticoagulants: Secondary | ICD-10-CM | POA: Diagnosis not present

## 2023-07-30 DIAGNOSIS — S12401D Unspecified nondisplaced fracture of fifth cervical vertebra, subsequent encounter for fracture with routine healing: Secondary | ICD-10-CM | POA: Diagnosis not present

## 2023-07-30 DIAGNOSIS — W19XXXD Unspecified fall, subsequent encounter: Secondary | ICD-10-CM | POA: Diagnosis not present

## 2023-07-30 DIAGNOSIS — Z9181 History of falling: Secondary | ICD-10-CM | POA: Diagnosis not present

## 2023-08-04 DIAGNOSIS — Z7985 Long-term (current) use of injectable non-insulin antidiabetic drugs: Secondary | ICD-10-CM | POA: Diagnosis not present

## 2023-08-04 DIAGNOSIS — G40909 Epilepsy, unspecified, not intractable, without status epilepticus: Secondary | ICD-10-CM | POA: Diagnosis not present

## 2023-08-04 DIAGNOSIS — F329 Major depressive disorder, single episode, unspecified: Secondary | ICD-10-CM | POA: Diagnosis not present

## 2023-08-04 DIAGNOSIS — E039 Hypothyroidism, unspecified: Secondary | ICD-10-CM | POA: Diagnosis not present

## 2023-08-04 DIAGNOSIS — S12401D Unspecified nondisplaced fracture of fifth cervical vertebra, subsequent encounter for fracture with routine healing: Secondary | ICD-10-CM | POA: Diagnosis not present

## 2023-08-04 DIAGNOSIS — Z9181 History of falling: Secondary | ICD-10-CM | POA: Diagnosis not present

## 2023-08-04 DIAGNOSIS — E119 Type 2 diabetes mellitus without complications: Secondary | ICD-10-CM | POA: Diagnosis not present

## 2023-08-04 DIAGNOSIS — F259 Schizoaffective disorder, unspecified: Secondary | ICD-10-CM | POA: Diagnosis not present

## 2023-08-04 DIAGNOSIS — I4891 Unspecified atrial fibrillation: Secondary | ICD-10-CM | POA: Diagnosis not present

## 2023-08-04 DIAGNOSIS — Z7901 Long term (current) use of anticoagulants: Secondary | ICD-10-CM | POA: Diagnosis not present

## 2023-08-09 DIAGNOSIS — Z9181 History of falling: Secondary | ICD-10-CM | POA: Diagnosis not present

## 2023-08-09 DIAGNOSIS — G40909 Epilepsy, unspecified, not intractable, without status epilepticus: Secondary | ICD-10-CM | POA: Diagnosis not present

## 2023-08-09 DIAGNOSIS — I4891 Unspecified atrial fibrillation: Secondary | ICD-10-CM | POA: Diagnosis not present

## 2023-08-09 DIAGNOSIS — E119 Type 2 diabetes mellitus without complications: Secondary | ICD-10-CM | POA: Diagnosis not present

## 2023-08-09 DIAGNOSIS — Z7985 Long-term (current) use of injectable non-insulin antidiabetic drugs: Secondary | ICD-10-CM | POA: Diagnosis not present

## 2023-08-09 DIAGNOSIS — E039 Hypothyroidism, unspecified: Secondary | ICD-10-CM | POA: Diagnosis not present

## 2023-08-09 DIAGNOSIS — S12401D Unspecified nondisplaced fracture of fifth cervical vertebra, subsequent encounter for fracture with routine healing: Secondary | ICD-10-CM | POA: Diagnosis not present

## 2023-08-09 DIAGNOSIS — F329 Major depressive disorder, single episode, unspecified: Secondary | ICD-10-CM | POA: Diagnosis not present

## 2023-08-09 DIAGNOSIS — Z7901 Long term (current) use of anticoagulants: Secondary | ICD-10-CM | POA: Diagnosis not present

## 2023-08-09 DIAGNOSIS — F259 Schizoaffective disorder, unspecified: Secondary | ICD-10-CM | POA: Diagnosis not present

## 2023-08-11 DIAGNOSIS — F259 Schizoaffective disorder, unspecified: Secondary | ICD-10-CM | POA: Diagnosis not present

## 2023-08-11 DIAGNOSIS — F329 Major depressive disorder, single episode, unspecified: Secondary | ICD-10-CM | POA: Diagnosis not present

## 2023-08-11 DIAGNOSIS — Z7901 Long term (current) use of anticoagulants: Secondary | ICD-10-CM | POA: Diagnosis not present

## 2023-08-11 DIAGNOSIS — G40909 Epilepsy, unspecified, not intractable, without status epilepticus: Secondary | ICD-10-CM | POA: Diagnosis not present

## 2023-08-11 DIAGNOSIS — Z7985 Long-term (current) use of injectable non-insulin antidiabetic drugs: Secondary | ICD-10-CM | POA: Diagnosis not present

## 2023-08-11 DIAGNOSIS — S12401D Unspecified nondisplaced fracture of fifth cervical vertebra, subsequent encounter for fracture with routine healing: Secondary | ICD-10-CM | POA: Diagnosis not present

## 2023-08-11 DIAGNOSIS — E039 Hypothyroidism, unspecified: Secondary | ICD-10-CM | POA: Diagnosis not present

## 2023-08-11 DIAGNOSIS — E119 Type 2 diabetes mellitus without complications: Secondary | ICD-10-CM | POA: Diagnosis not present

## 2023-08-11 DIAGNOSIS — Z9181 History of falling: Secondary | ICD-10-CM | POA: Diagnosis not present

## 2023-08-11 DIAGNOSIS — I4891 Unspecified atrial fibrillation: Secondary | ICD-10-CM | POA: Diagnosis not present

## 2023-08-12 ENCOUNTER — Telehealth: Payer: Self-pay

## 2023-08-12 NOTE — Telephone Encounter (Signed)
 Called back to [place] and advised of message per PCP.   Elsie Halo, RN

## 2023-08-12 NOTE — Telephone Encounter (Signed)
 Received message from [facility] regarding patient. They are requesting verbal orders for once weekly CBG check.   They are wanting to check blood sugar on the same day that patient receives Trulicity injection.   Please advise.   Elsie Halo, RN

## 2023-08-16 DIAGNOSIS — E039 Hypothyroidism, unspecified: Secondary | ICD-10-CM | POA: Diagnosis not present

## 2023-08-16 DIAGNOSIS — F329 Major depressive disorder, single episode, unspecified: Secondary | ICD-10-CM | POA: Diagnosis not present

## 2023-08-16 DIAGNOSIS — E119 Type 2 diabetes mellitus without complications: Secondary | ICD-10-CM | POA: Diagnosis not present

## 2023-08-16 DIAGNOSIS — F259 Schizoaffective disorder, unspecified: Secondary | ICD-10-CM | POA: Diagnosis not present

## 2023-08-16 DIAGNOSIS — Z9181 History of falling: Secondary | ICD-10-CM | POA: Diagnosis not present

## 2023-08-16 DIAGNOSIS — S12401D Unspecified nondisplaced fracture of fifth cervical vertebra, subsequent encounter for fracture with routine healing: Secondary | ICD-10-CM | POA: Diagnosis not present

## 2023-08-16 DIAGNOSIS — G40909 Epilepsy, unspecified, not intractable, without status epilepticus: Secondary | ICD-10-CM | POA: Diagnosis not present

## 2023-08-16 DIAGNOSIS — Z7901 Long term (current) use of anticoagulants: Secondary | ICD-10-CM | POA: Diagnosis not present

## 2023-08-16 DIAGNOSIS — Z7985 Long-term (current) use of injectable non-insulin antidiabetic drugs: Secondary | ICD-10-CM | POA: Diagnosis not present

## 2023-08-16 DIAGNOSIS — I4891 Unspecified atrial fibrillation: Secondary | ICD-10-CM | POA: Diagnosis not present

## 2023-08-20 DIAGNOSIS — F259 Schizoaffective disorder, unspecified: Secondary | ICD-10-CM | POA: Diagnosis not present

## 2023-08-20 DIAGNOSIS — I4891 Unspecified atrial fibrillation: Secondary | ICD-10-CM | POA: Diagnosis not present

## 2023-08-20 DIAGNOSIS — E039 Hypothyroidism, unspecified: Secondary | ICD-10-CM | POA: Diagnosis not present

## 2023-08-20 DIAGNOSIS — G40909 Epilepsy, unspecified, not intractable, without status epilepticus: Secondary | ICD-10-CM | POA: Diagnosis not present

## 2023-08-20 DIAGNOSIS — Z9181 History of falling: Secondary | ICD-10-CM | POA: Diagnosis not present

## 2023-08-20 DIAGNOSIS — F329 Major depressive disorder, single episode, unspecified: Secondary | ICD-10-CM | POA: Diagnosis not present

## 2023-08-20 DIAGNOSIS — Z7985 Long-term (current) use of injectable non-insulin antidiabetic drugs: Secondary | ICD-10-CM | POA: Diagnosis not present

## 2023-08-20 DIAGNOSIS — E119 Type 2 diabetes mellitus without complications: Secondary | ICD-10-CM | POA: Diagnosis not present

## 2023-08-20 DIAGNOSIS — Z7901 Long term (current) use of anticoagulants: Secondary | ICD-10-CM | POA: Diagnosis not present

## 2023-08-20 DIAGNOSIS — S12401D Unspecified nondisplaced fracture of fifth cervical vertebra, subsequent encounter for fracture with routine healing: Secondary | ICD-10-CM | POA: Diagnosis not present

## 2023-08-23 NOTE — Progress Notes (Unsigned)
[facility] Initial Consultation   Referring Provider Tiffany Kocher, DO [address] [place],  [place] [address]  Primary Care Provider Tiffany Kocher, DO  Patient Profile: [name] is a 63 y.o. male who is seen in consultation in the [facility] at the request of Dr. Claudean Severance for evaluation and management of the problem(s) noted below.  Problem List: GERD Dysphagia Family history of esophageal cancer (father) and Barrett's esophagus (mother) Colon cancer screening   History of Present Illness   [name] is a 63 y.o. male with a history of atrial fibrillation on apixaban, HLD, T2DM, seizures (controlled on Depakote), schizophrenia is seen in the gastroenterology office for evaluation of GERD, dysphagia and colon cancer screening.  GERD/dysphagia In speaking with [name] as well as reviewing his prior medical records he endorses symptoms of solid food dysphagia for a few years Reports that certain types of food gets stuck -dry rice, bread, apple - and sometimes he needs to regurgitate material Also has difficulty swallowing pills -states that he takes applesauce with pills No dysphagia to liquids Has GERD symptoms that seem to be worse at night GERD and abdominal discomfort exacerbated by spicy foods, jalapenos and barbecue sauce GERD is currently managed on omeprazole 40 mg orally daily  [name] had a significant fall in October 2024 resulting in a C5-6 fracture requiring neurosurgical procedure including discectomy and placement of plate, screws He has a large scar over his right anterior neck He is vague as to whether or not his swallowing issues worsened after his surgical procedure  Denies history of stroke or other neurologic insults that would impact swallowing  Reports a family history of esophageal cancer in his father and his mother having Barrett's esophagus There is notation on his past medical history of having Barrett's esophagus  but I cannot find an endoscopy report to corroborate this  Colon cancer screening With regard to colon cancer screening, [name] has never had a colonoscopy No family history of colorectal cancer or polyps States that he is prone to constipation and can go 6 to 10 days without a bowel movement Now residing at an assisted living since his fall in October 2024 and eating more fiber and vegetables -has helped his bowel movements No blood or mucus in his stool  Records indicate a history of seizures -[name] states this has been stable on Depakote   Last colonoscopy: None Last endoscopy: None  Last Abd CT/CTE/MRE: None  GI Review of Symptoms Significant for solid food dysphagia, regurgitation, constipation. Otherwise negative.  General Review of Systems  Review of systems is significant for the pertinent positives and negatives as listed per the HPI.  Full ROS is otherwise negative.  Past Medical History   Past Medical History:  Diagnosis Date   A-fib [facility])    Acute right-sided low back pain    Anxiety    Arrhythmia    Barrett esophagus    Chronic a-fib (HCC)    Closed fracture of left distal radius    Contact dermatitis 04/16/2017   Depression    Depression    Hypothyroidism    Low back pain    Morbid obesity, BMI unknown (HCC)    Obesity    Osteoarthritis    oa in bilateral knees   Schizophrenia (HCC)    Seizures (HCC)    Seizures (HCC)    Thyroid disease    Varicose veins      Past Surgical History   Past Surgical History:  Procedure Laterality  Date   ACHILLES TENDON REPAIR  approx. 2004   right foot   ANTERIOR CERVICAL DECOMP/DISCECTOMY FUSION N/A 04/08/2023   Procedure: Anterior Cervical Discectomy Fusion Cervical Five-Cervical Six;  Surgeon: Lisbeth Renshaw, MD;  Location: [facility] OR;  Service: Neurosurgery;  Laterality: N/A;   CATARACT EXTRACTION     bilateral   OPEN REDUCTION INTERNAL FIXATION (ORIF) DISTAL RADIAL FRACTURE Left 07/21/2018   Procedure: OPEN  REDUCTION INTERNAL FIXATION (ORIF) DISTAL RADIAL FRACTURE;  Surgeon: Betha Loa, MD;  Location: [facility];  Service: Orthopedics;  Laterality: Left;   stab phlebectomy  Right 11/09/2016   stab phlebectomy R leg by Josephina Gip MD   TOOTH EXTRACTION       Allergies and Medications   Allergies  Allergen Reactions   Lamictal [Lamotrigine] Rash   Metformin And Related Rash    Current Meds  Medication Sig   apixaban (ELIQUIS) 5 MG TABS tablet Take 1 tablet (5 mg total) by mouth 2 (two) times daily.   atorvastatin (LIPITOR) 40 MG tablet TAKE 1 TABLET BY MOUTH EVERY DAY   benztropine (COGENTIN) 0.5 MG tablet Take 1 tablet (0.5 mg total) by mouth 2 (two) times daily.   divalproex (DEPAKOTE ER) 500 MG 24 hr tablet Take 1 tablet (500 mg total) by mouth 2 (two) times daily.   Dulaglutide (TRULICITY) 1.5 MG/0.5ML SOAJ Inject 1.5 mg into the skin once a week.   levothyroxine (SYNTHROID) 125 MCG tablet Take 1 tablet (125 mcg total) by mouth daily.   perphenazine (TRILAFON) 8 MG tablet Take 3 tablets (24 mg total) by mouth at bedtime.   phenelzine (NARDIL) 15 MG tablet Take 2 tablets (30 mg total) by mouth 2 (two) times daily.   senna-docusate (SENOKOT-S) 8.6-50 MG tablet Take 1 tablet by mouth at bedtime.   [EXPIRED] SUFLAVE 178.7 g SOLR Take 1 kit by mouth once for 1 dose.    Family History   Family History  Problem Relation Age of Onset   OCD Mother    Barrett's esophagus Mother    Diabetes Father    Heart disease Father    Esophageal cancer Father    Diabetes Brother    Liver disease Neg Hx    Colon cancer Neg Hx      Social History   Social History   Tobacco Use   Smoking status: Never   Smokeless tobacco: Never  Vaping Use   Vaping status: Never Used  Substance Use Topics   Alcohol use: No    Alcohol/week: 0.0 standard drinks of alcohol   Drug use: No   [name] reports that he has never smoked. He has never used smokeless tobacco. He reports that he does  not drink alcohol and does not use drugs.  Vital Signs and Physical Examination   Vitals:   08/24/23 1045  BP: 118/68  Pulse: 82   Body mass index is 32.43 kg/m. Weight: 226 lb (102.5 kg)  General: Alert, sitting in chair, mild resting tremor Head: Normocephalic and atraumatic, large scar over right anterior neck Eyes: Sclerae anicteric, EOMI Lungs: Clear throughout to auscultation Heart: Regular rate and rhythm; No murmurs, rubs or bruits Abdomen: Soft, non tender and non distended. No masses, hepatosplenomegaly or hernias noted. Normal Bowel sounds Rectal: Deferred Musculoskeletal: Symmetrical with no gross deformities  Neurological: Alert oriented x 4, has mild resting tremor Psychological:  Alert and cooperative. Normal mood and affect  Review of Data  The following data was reviewed at the time of this encounter:  Laboratory Studies      Latest Ref Rng & Units 04/12/2023    8:16 AM 04/11/2023    9:31 AM 04/07/2023    5:23 AM  CBC  WBC 4.0 - 10.5 K/uL 10.2  11.6  8.8   Hemoglobin 13.0 - 17.0 g/dL 40.9  81.1  91.4   Hematocrit 39.0 - 52.0 % 41.4  41.9  38.9   Platelets 150 - 400 K/uL 143  199  142     No results found for: "LIPASE"    Latest Ref Rng & Units 04/12/2023    8:16 AM 04/11/2023    9:31 AM 04/05/2023    7:50 AM  CMP  Glucose 70 - 99 mg/dL 94  782  956   BUN 8 - 23 mg/dL 16  17  8    Creatinine 0.61 - 1.24 mg/dL 2.13  0.86  5.78   Sodium 135 - 145 mmol/L 136  133  136   Potassium 3.5 - 5.1 mmol/L 4.0  3.6  3.6   Chloride 98 - 111 mmol/L 97  94  98   CO2 22 - 32 mmol/L 27  30  26    Calcium 8.9 - 10.3 mg/dL 8.5  8.7  8.6      Imaging Studies  None  GI Procedures and Studies  None    Clinical Impression  It is my clinical impression that [name] is a 63 y.o. male with;  GERD Dysphagia Family history of esophageal cancer (father) and Barrett's esophagus (mother) Colon cancer screening  [name] is referred to the office today to evaluate  history of GERD and food dysphagia.  He reports that the symptoms have been present for several years.  Notes that he has difficulty swallowing foods such as dry rice, bread and applesauce that become caught in his throat.  His chart lists a personal history of Barrett's esophagus, however, I cannot locate a previous endoscopy report.  [name] states that his father had esophageal cancer and his mother had Barrett's esophagus.  It is also noteworthy that he had a significant fall in October 2024 resulting in a C5-C6 fracture.  He underwent neurosurgical stabilization of the fracture with hardware.  It is difficult to elicit if his swallowing problems worsened after the fall and surgery.  Before proceeding with upper endoscopy I have recommended a barium swallow to evaluate his anatomy for extrinsic compression from his cervical hardware.  If this does not show any abnormality will proceed with upper endoscopy for further evaluation.  He does appear to have some neurological issues and mild resting tremor raising the specter of Parkinson's.  Could also have a form of oropharyngeal dysphagia and/or esophageal dysmotility.  He is due for colon cancer screening.  Notes incidental constipation that is improved since increasing fiber in his diet while residing at assisted living.  No family history of colorectal cancer or colon polyps.  Reviewed that we could perform a colonoscopy at the same time as upper endoscopy as long as no abnormalities on barium esophagram precluding this.  He is currently on apixaban for A-fib which would need to be discontinued for 2 days prior to his procedure.  He has a history of seizures but these are well-controlled on Depakote  Plan  Continue omeprazole 40 mg orally daily Schedule barium esophagram to evaluate for extrinsic compression of neurosurgical hardware contributing to extrinsic compression of the esophagus Barring abnormalities on barium esophagram we will proceed with  EGD[facility]colonoscopy at [facility] Communicate with cardiology regarding holding  Eliquis x 48 hours prior to endoscopic procedures If barium esophagram and EGD do not disclose etiologies of his dysphagia consider speech pathology evaluation/modified barium swallow  Planned Follow Up TBD pending results of endoscopic procedures  The patient or caregiver verbalized understanding of the material covered, with no barriers to understanding. All questions were answered. Patient or caregiver is agreeable with the plan outlined above.    It was a pleasure to see [name].  If you have any questions or concerns regarding this evaluation, do not hesitate to contact me.  Maren Beach, MD [facility]   I spent total of 45  minutes in both face-to-face and non-face-to-face activities, excluding procedures performed, for the visit on the date of this encounter.

## 2023-08-24 ENCOUNTER — Encounter: Payer: Self-pay | Admitting: Pediatrics

## 2023-08-24 ENCOUNTER — Ambulatory Visit (INDEPENDENT_AMBULATORY_CARE_PROVIDER_SITE_OTHER): Payer: Medicare HMO | Admitting: Pediatrics

## 2023-08-24 ENCOUNTER — Telehealth: Payer: Self-pay

## 2023-08-24 VITALS — BP 118/68 | HR 82 | Ht 70.0 in | Wt 226.0 lb

## 2023-08-24 DIAGNOSIS — Z8379 Family history of other diseases of the digestive system: Secondary | ICD-10-CM

## 2023-08-24 DIAGNOSIS — K219 Gastro-esophageal reflux disease without esophagitis: Secondary | ICD-10-CM | POA: Diagnosis not present

## 2023-08-24 DIAGNOSIS — Z800 Family history of malignant neoplasm of digestive organs: Secondary | ICD-10-CM

## 2023-08-24 DIAGNOSIS — R1310 Dysphagia, unspecified: Secondary | ICD-10-CM | POA: Diagnosis not present

## 2023-08-24 DIAGNOSIS — Z1211 Encounter for screening for malignant neoplasm of colon: Secondary | ICD-10-CM

## 2023-08-24 MED ORDER — SUFLAVE 178.7 G PO SOLR
1.0000 | Freq: Once | ORAL | 0 refills | Status: AC
Start: 2023-08-24 — End: 2023-08-24

## 2023-08-24 NOTE — Patient Instructions (Signed)
You have been scheduled for an endoscopy and colonoscopy. Please follow the written instructions given to you at your visit today.  If you use inhalers (even only as needed), please bring them with you on the day of your procedure.  DO NOT TAKE 7 DAYS PRIOR TO TEST- Trulicity (dulaglutide) Ozempic, Wegovy (semaglutide) Mounjaro (tirzepatide) Bydureon Bcise (exanatide extended release)  DO NOT TAKE 1 DAY PRIOR TO YOUR TEST Rybelsus (semaglutide) Adlyxin (lixisenatide) Victoza (liraglutide) Byetta (exanatide)  You will be contacted by our office prior to your procedure for directions on holding your Eliquis.  If you do not hear from our office 1 week prior to your scheduled procedure, please call [phone] to discuss.   ___________________________________________________________________________  Bonita Quin will receive your bowel preparation through [facility], which ensures the lowest copay and home delivery, with outreach via text or call from an [phone] number. Please respond promptly to avoid rescheduling of your procedure. If you are interested in alternative options or have any questions regarding your prep, please contact them at [phone] ____________________________________________________________________________  Your Provider Has Sent Your Bowel Prep Regimen To [facility]   [facility] will contact you to verify your information and collect your copay, if applicable. Enjoy the comfort of your home while your prescription is mailed to you, FREE of any shipping charges.   [facility] accepts all major insurance benefits and applies discounts & coupons.  Have additional questions?   Chat: [email or web address] Call: [phone] Email: [email or web address] NCPDP: [id number]  How will [facility] contact you?  With a Welcome phone call,  a Welcome text and a checkout link in text form.  Texts you receive from [phone] Are NOT Spam.  *To set up delivery, you must  complete the checkout process via link or speak to one of the patient care representatives. If [facility] is unable to reach you, your prescription may be delayed.  To avoid long hold times on the phone, you may also utilize the secure chat feature on the [facility] website to request that they call you back for transaction completion or to expedite your concerns.   You have been scheduled for a Barium Esophogram at [facility] (1st floor of the hospital) on 09/01/23 at 10:00 AM. Please arrive 30 minutes prior to your appointment for registration. Make certain not to have anything to eat or drink 3 hours prior to your test. If you need to reschedule for any reason, please contact radiology at [phone] to do so. __________________________________________________________________ A barium swallow is an examination that concentrates on views of the esophagus. This tends to be a double contrast exam (barium and two liquids which, when combined, create a gas to distend the wall of the oesophagus) or single contrast (non-ionic iodine based). The study is usually tailored to your symptoms so a good history is essential. Attention is paid during the study to the form, structure and configuration of the esophagus, looking for functional disorders (such as aspiration, dysphagia, achalasia, motility and reflux) EXAMINATION You may be asked to change into a gown, depending on the type of swallow being performed. A radiologist and radiographer will perform the procedure. The radiologist will advise you of the type of contrast selected for your procedure and direct you during the exam. You will be asked to stand, sit or lie in several different positions and to hold a small amount of fluid in your mouth before being asked to swallow while the imaging is performed .In some instances you may be asked to swallow barium coated marshmallows to  assess the motility of a solid food bolus. The exam can be recorded  as a digital or video fluoroscopy procedure. POST PROCEDURE It will take 1-2 days for the barium to pass through your system. To facilitate this, it is important, unless otherwise directed, to increase your fluids for the next 24-48hrs and to resume your normal diet.  This test typically takes about 30 minutes to perform. __________________________________________________________________________________  Thank you for entrusting me with your care and for choosing [facility], Dr. Maren Beach   _______________________________________________________  If your blood pressure at your visit was 140/90 or greater, please contact your primary care physician to follow up on this.  _______________________________________________________  If you are age 26 or older, your body mass index should be between 23-30. Your Body mass index is 32.43 kg/m. If this is out of the aforementioned range listed, please consider follow up with your Primary Care Provider.  If you are age 51 or younger, your body mass index should be between 19-25. Your Body mass index is 32.43 kg/m. If this is out of the aformentioned range listed, please consider follow up with your Primary Care Provider.   ________________________________________________________  The [facility] providers would like to encourage you to use [facility] to communicate with providers for non-urgent requests or questions.  Due to long hold times on the telephone, sending your provider a message by [facility] may be a faster and more efficient way to get a response.  Please allow 48 business hours for a response.  Please remember that this is for non-urgent requests.  _______________________________________________________

## 2023-08-24 NOTE — Telephone Encounter (Signed)
  [name] 1961-03-09 [id number]  08/24/2023   Dear Dr. McDiarmid:  We have scheduled the above named patient for a(n) EGD/ Colonoscopy procedure. Our records show that (s)he is on anticoagulation therapy.  Please advise as to whether the patient may come off their therapy of Eliquis 2 days prior to their procedure which is scheduled for 10/07/23.  Please route your response to [facility], CMA or fax response to [phone].  Sincerely,    [facility]

## 2023-08-26 ENCOUNTER — Encounter: Payer: Self-pay | Admitting: Pediatrics

## 2023-08-26 DIAGNOSIS — Z7901 Long term (current) use of anticoagulants: Secondary | ICD-10-CM | POA: Diagnosis not present

## 2023-08-26 DIAGNOSIS — Z7985 Long-term (current) use of injectable non-insulin antidiabetic drugs: Secondary | ICD-10-CM | POA: Diagnosis not present

## 2023-08-26 DIAGNOSIS — G40909 Epilepsy, unspecified, not intractable, without status epilepticus: Secondary | ICD-10-CM | POA: Diagnosis not present

## 2023-08-26 DIAGNOSIS — F329 Major depressive disorder, single episode, unspecified: Secondary | ICD-10-CM | POA: Diagnosis not present

## 2023-08-26 DIAGNOSIS — S12401D Unspecified nondisplaced fracture of fifth cervical vertebra, subsequent encounter for fracture with routine healing: Secondary | ICD-10-CM | POA: Diagnosis not present

## 2023-08-26 DIAGNOSIS — I4891 Unspecified atrial fibrillation: Secondary | ICD-10-CM | POA: Diagnosis not present

## 2023-08-26 DIAGNOSIS — E119 Type 2 diabetes mellitus without complications: Secondary | ICD-10-CM | POA: Diagnosis not present

## 2023-08-26 DIAGNOSIS — F259 Schizoaffective disorder, unspecified: Secondary | ICD-10-CM | POA: Diagnosis not present

## 2023-08-26 DIAGNOSIS — Z9181 History of falling: Secondary | ICD-10-CM | POA: Diagnosis not present

## 2023-08-26 DIAGNOSIS — E039 Hypothyroidism, unspecified: Secondary | ICD-10-CM | POA: Diagnosis not present

## 2023-08-30 DIAGNOSIS — E039 Hypothyroidism, unspecified: Secondary | ICD-10-CM | POA: Diagnosis not present

## 2023-08-30 DIAGNOSIS — S12401D Unspecified nondisplaced fracture of fifth cervical vertebra, subsequent encounter for fracture with routine healing: Secondary | ICD-10-CM | POA: Diagnosis not present

## 2023-08-30 DIAGNOSIS — F329 Major depressive disorder, single episode, unspecified: Secondary | ICD-10-CM | POA: Diagnosis not present

## 2023-08-30 DIAGNOSIS — F259 Schizoaffective disorder, unspecified: Secondary | ICD-10-CM | POA: Diagnosis not present

## 2023-08-30 DIAGNOSIS — Z7901 Long term (current) use of anticoagulants: Secondary | ICD-10-CM | POA: Diagnosis not present

## 2023-08-30 DIAGNOSIS — Z7985 Long-term (current) use of injectable non-insulin antidiabetic drugs: Secondary | ICD-10-CM | POA: Diagnosis not present

## 2023-08-30 DIAGNOSIS — I4891 Unspecified atrial fibrillation: Secondary | ICD-10-CM | POA: Diagnosis not present

## 2023-08-30 DIAGNOSIS — Z9181 History of falling: Secondary | ICD-10-CM | POA: Diagnosis not present

## 2023-08-30 DIAGNOSIS — E119 Type 2 diabetes mellitus without complications: Secondary | ICD-10-CM | POA: Diagnosis not present

## 2023-08-30 DIAGNOSIS — G40909 Epilepsy, unspecified, not intractable, without status epilepticus: Secondary | ICD-10-CM | POA: Diagnosis not present

## 2023-08-30 NOTE — Telephone Encounter (Signed)
 Tiffany Kocher, DO  Stancil-Glaze, Cordaro Mukai J, CMA Ok to hold patient's eliquis before procedure.  Thanks, Dr. Claudean Severance

## 2023-08-30 NOTE — Telephone Encounter (Signed)
 I spoke to [name] and I advised him that his doctor authorized him to hold his Eliquis 2 day before his procedure.  I explained that he will hold it on 4/1 for his 4/3 procedure and Dr. Doy Hutching will let him know when to restart after his procedure.  Patient verbalized that he understood the instructions and agreed to the plan of care.

## 2023-09-01 ENCOUNTER — Ambulatory Visit (HOSPITAL_COMMUNITY)
Admission: RE | Admit: 2023-09-01 | Discharge: 2023-09-01 | Disposition: A | Discharge status: Home / Self Care | Payer: Medicare HMO | Source: Ambulatory Visit | Attending: Pediatrics | Admitting: Pediatrics

## 2023-09-01 ENCOUNTER — Encounter: Payer: Self-pay | Admitting: Pediatrics

## 2023-09-01 DIAGNOSIS — R1310 Dysphagia, unspecified: Secondary | ICD-10-CM

## 2023-09-01 DIAGNOSIS — K219 Gastro-esophageal reflux disease without esophagitis: Secondary | ICD-10-CM

## 2023-09-01 DIAGNOSIS — K224 Dyskinesia of esophagus: Secondary | ICD-10-CM | POA: Diagnosis not present

## 2023-09-02 ENCOUNTER — Encounter (HOSPITAL_COMMUNITY): Payer: Self-pay | Admitting: Psychiatry

## 2023-09-02 ENCOUNTER — Other Ambulatory Visit: Payer: Self-pay

## 2023-09-02 ENCOUNTER — Ambulatory Visit (HOSPITAL_BASED_OUTPATIENT_CLINIC_OR_DEPARTMENT_OTHER): Payer: Medicare HMO | Admitting: Psychiatry

## 2023-09-02 VITALS — BP 106/72 | HR 88 | Ht 65.5 in | Wt 225.0 lb

## 2023-09-02 DIAGNOSIS — F339 Major depressive disorder, recurrent, unspecified: Secondary | ICD-10-CM

## 2023-09-02 DIAGNOSIS — F419 Anxiety disorder, unspecified: Secondary | ICD-10-CM

## 2023-09-02 MED ORDER — BENZTROPINE MESYLATE 0.5 MG PO TABS: 0.5000 mg | ORAL_TABLET | Freq: Two times a day (BID) | ORAL | 2 refills | Status: DC

## 2023-09-02 MED ORDER — PERPHENAZINE 8 MG PO TABS
24.0000 mg | ORAL_TABLET | Freq: Every day | ORAL | 2 refills | Status: DC
Start: 2023-09-02 — End: 2023-12-02

## 2023-09-02 MED ORDER — DIVALPROEX SODIUM ER 500 MG PO TB24
500.0000 mg | ORAL_TABLET | Freq: Two times a day (BID) | ORAL | 2 refills | Status: DC
Start: 2023-09-02 — End: 2023-12-02

## 2023-09-02 NOTE — Progress Notes (Signed)
 BH MD/PA/NP OP Progress Note  Patient Location: Office Provider Location: Office  09/02/2023 10:02 AM [name]  MRN:  [id number]  Chief Complaint:  Chief Complaint  Patient presents with   Anxiety   Medication Refill   Follow-up   HPI: Patient came today for his follow-up appointment.  He reported things are okay but not great.  He do not like his living situation but so far no major concerns.  He is pleased no more falls or dizziness.  We did increase Cogentin and Nardil but apparently his physician cut down back to Nardil 30 mg twice a day.  He is not happy reducing the medication and like to go to 90 mg a day.  I explained that he should stay on 30 mg twice a day unless his primary care has no objection going up on the dose.  Overall he feel okay.  He has some residual depression but denies any mania, suicidal thoughts, aggression or violence.  He admitted feeling sad and upset because not able to go outside to watch hockey game.  He feels rules are very strict and not able to get food from outside.  He is trying to make friends which is sometimes difficult.  He is also not happy because cannot get Ozempic due to insurance issues.  He is on Trulicity.  He is going to see his primary care Dr. Lindwood Qua in a week.  He denies any hallucination, paranoia, panic attack.  His mother lives in [place] and he noticed that she is also gradually declining in her health.  Patient told apparently one of his family member have left trust money which he should able to get but his brother is holding the money and have not released to him.  Patient told he is not sure if he ever get the money from his brother.  He has chronic tremors but Cogentin had helped.  He has not done definite test for Parkinson's which was recommended.  He is compliant with Depakote.  His appetite is okay.  He is compliant with Trilafon.  He has no more seizures since the last visit.  He denies drinking or using any illegal  substances.  His appetite is okay.  His weight is stable.  Visit Diagnosis:    ICD-10-CM   1. MDD (major depressive disorder), recurrent episode, with atypical features (HCC)  F33.9 benztropine (COGENTIN) 0.5 MG tablet    divalproex (DEPAKOTE ER) 500 MG 24 hr tablet    perphenazine (TRILAFON) 8 MG tablet    2. Anxiety  F41.9 benztropine (COGENTIN) 0.5 MG tablet       Past Psychiatric History: Reviewed H/O multiple inpatient from [phone] for severe depression, suicidal thoughts. No h/o suicidal attempt. H/O OCD, schizoaffective disorder and major depression.  Tried numerous medication including TCAs.  We tried Lamictal but he develop a rash.   Past Medical History:  Past Medical History:  Diagnosis Date   A-fib (HCC)    Acute right-sided low back pain    Anxiety    Arrhythmia    Barrett esophagus    Chronic a-fib (HCC)    Closed fracture of left distal radius    Contact dermatitis 04/16/2017   Depression    Depression    Hypothyroidism    Low back pain    Morbid obesity, BMI unknown (HCC)    Obesity    Osteoarthritis    oa in bilateral knees   Schizophrenia (HCC)    Seizures (HCC)  Seizures (HCC)    Thyroid disease    Varicose veins     Past Surgical History:  Procedure Laterality Date   ACHILLES TENDON REPAIR  approx. 2004   right foot   ANTERIOR CERVICAL DECOMP/DISCECTOMY FUSION N/A 04/08/2023   Procedure: Anterior Cervical Discectomy Fusion Cervical Five-Cervical Six;  Surgeon: Lisbeth Renshaw, MD;  Location: [facility] OR;  Service: Neurosurgery;  Laterality: N/A;   CATARACT EXTRACTION     bilateral   OPEN REDUCTION INTERNAL FIXATION (ORIF) DISTAL RADIAL FRACTURE Left 07/21/2018   Procedure: OPEN REDUCTION INTERNAL FIXATION (ORIF) DISTAL RADIAL FRACTURE;  Surgeon: Betha Loa, MD;  Location: [facility];  Service: Orthopedics;  Laterality: Left;   stab phlebectomy  Right 11/09/2016   stab phlebectomy R leg by Josephina Gip MD   TOOTH EXTRACTION       Family Psychiatric History: Reviewed  Family History:  Family History  Problem Relation Age of Onset   OCD Mother    Barrett's esophagus Mother    Diabetes Father    Heart disease Father    Esophageal cancer Father    Diabetes Brother    Liver disease Neg Hx    Colon cancer Neg Hx     Social History:  Social History   Socioeconomic History   Marital status: Single    Spouse name: Not on file   Number of children: 0   Years of education: Not on file   Highest education level: Not on file  Occupational History   Occupation: disabled  Tobacco Use   Smoking status: Never   Smokeless tobacco: Never  Vaping Use   Vaping status: Never Used  Substance and Sexual Activity   Alcohol use: No    Alcohol/week: 0.0 standard drinks of alcohol   Drug use: No   Sexual activity: Never  Other Topics Concern   Not on file  Social History Narrative   Pt lives at [facility]    Pt retired    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: Low Risk  (02/13/2023)   Overall Financial Resource Strain (CARDIA)    Difficulty of Paying Living Expenses: Not hard at all  Food Insecurity: No Food Insecurity (04/04/2023)   Hunger Vital Sign    Worried About Running Out of Food in the Last Year: Never true    Ran Out of Food in the Last Year: Never true  Transportation Needs: No Transportation Needs (04/04/2023)   PRAPARE - Administrator, Civil Service (Medical): No    Lack of Transportation (Non-Medical): No  Physical Activity: Inactive (02/13/2023)   Exercise Vital Sign    Days of Exercise per Week: 0 days    Minutes of Exercise per Session: 0 min  Stress: No Stress Concern Present (02/13/2023)   [employer] of Occupational Health - Occupational Stress Questionnaire    Feeling of Stress : Not at all  Social Connections: Socially Isolated (02/13/2023)   Social Connection and Isolation Panel [NHANES]    Frequency of Communication with Friends and  Family: More than three times a week    Frequency of Social Gatherings with Friends and Family: Three times a week    Attends Religious Services: Never    Active Member of Clubs or Organizations: No    Attends Banker Meetings: Never    Marital Status: Never married    Allergies:  Allergies  Allergen Reactions   Lamictal [Lamotrigine] Rash   Metformin And Related Rash  Metabolic Disorder Labs: Lab Results  Component Value Date   HGBA1C 5.7 06/08/2023   MPG 136.98 04/04/2023   MPG 146 (H) 04/24/2015   No results found for: "PROLACTIN" Lab Results  Component Value Date   CHOL 126 09/08/2022   TRIG 178 (H) 09/08/2022   HDL 32 (L) 09/08/2022   CHOLHDL 3.9 09/08/2022   VLDL 60 (H) 07/16/2016   LDLCALC 64 09/08/2022   LDLCALC 113 (H) 12/18/2020   Lab Results  Component Value Date   TSH 0.785 06/08/2023   TSH 10.182 (H) 04/04/2023    Therapeutic Level Labs: No results found for: "LITHIUM" Lab Results  Component Value Date   VALPROATE 88 04/05/2023   VALPROATE 120 (H) 04/02/2023   No results found for: "CBMZ"  Current Medications: Current Outpatient Medications  Medication Sig Dispense Refill   acetaminophen (TYLENOL) 500 MG tablet Take 500-1,000 mg by mouth 2 (two) times daily as needed for mild pain. (Patient not taking: Reported on 08/24/2023)     allopurinol (ZYLOPRIM) 300 MG tablet TAKE 1 TABLET BY MOUTH EVERY DAY (Patient not taking: Reported on 08/24/2023) 90 tablet 1   apixaban (ELIQUIS) 5 MG TABS tablet Take 1 tablet (5 mg total) by mouth 2 (two) times daily. 180 tablet 2   atorvastatin (LIPITOR) 40 MG tablet TAKE 1 TABLET BY MOUTH EVERY DAY 90 tablet 1   benztropine (COGENTIN) 0.5 MG tablet Take 1 tablet (0.5 mg total) by mouth 2 (two) times daily. 60 tablet 1   calcium carbonate (TUMS - DOSED IN MG ELEMENTAL CALCIUM) 500 MG chewable tablet Chew 1 tablet (200 mg of elemental calcium total) by mouth daily as needed for indigestion or heartburn.  (Patient not taking: Reported on 08/24/2023)     divalproex (DEPAKOTE ER) 500 MG 24 hr tablet Take 1 tablet (500 mg total) by mouth 2 (two) times daily. 60 tablet 1   Dulaglutide (TRULICITY) 1.5 MG/0.5ML SOAJ Inject 1.5 mg into the skin once a week. 2 mL 1   levothyroxine (SYNTHROID) 125 MCG tablet Take 1 tablet (125 mcg total) by mouth daily.     omeprazole (PRILOSEC) 40 MG capsule Take 1 capsule (40 mg total) by mouth daily. (Patient not taking: Reported on 08/24/2023) 30 capsule 3   perphenazine (TRILAFON) 8 MG tablet Take 3 tablets (24 mg total) by mouth at bedtime. 90 tablet 1   phenelzine (NARDIL) 15 MG tablet Take 2 tablets (30 mg total) by mouth 2 (two) times daily. 120 tablet 0   phenelzine (NARDIL) 15 MG tablet Take 3 tablets (45 mg total) by mouth 2 (two) times daily. (Patient not taking: Reported on 08/24/2023) 180 tablet 1   polyethylene glycol (MIRALAX / GLYCOLAX) 17 g packet Take 34 g by mouth daily. (Patient not taking: Reported on 08/24/2023)     senna-docusate (SENOKOT-S) 8.6-50 MG tablet Take 1 tablet by mouth at bedtime.     No current facility-administered medications for this visit.     Musculoskeletal: Strength & Muscle Tone: decreased Gait & Station: unsteady, but able to walk without help Patient leans: N/A Psychiatric Specialty Exam: Physical Exam  Review of Systems  Height 5' 5.5" (1.664 m), weight 225 lb (102.1 kg).Body mass index is 36.87 kg/m.  General Appearance: Casual  Eye Contact:  Fair  Speech:  Slow  Volume:  Decreased  Mood:  Dysphoric  Affect:  Constricted  Thought Process:  Descriptions of Associations: Intact  Orientation:  Full (Time, Place, and Person)  Thought Content:  Rumination  Suicidal  Thoughts:  No  Homicidal Thoughts:  No  Memory:  Immediate;   Fair Recent;   Fair Remote;   Fair  Judgement:  Intact  Insight:  Present  Psychomotor Activity:  Decreased and Tremor  Concentration:  Concentration: Fair and Attention Span: Fair   Recall:  [employer] of Knowledge:  Fair  Language:  Good  Akathisia:  No  Handed:  Right  AIMS (if indicated):     Assets:  Communication Skills Desire for Improvement Housing  ADL's:  Intact  Cognition:  Impaired,  Mild  Sleep:   good     Screenings: CAGE-AID    Flowsheet Row ED to Hosp-Admission (Discharged) from 04/03/2023 in [facility]  CAGE-AID Score 0      PHQ2-9    Flowsheet Row Office Visit from 07/13/2023 in [facility] - A Dept Of [place]. [facility] Office Visit from 06/08/2023 in [facility] - [facility] Dept Of [place]. [facility] Office Visit from 02/19/2023 in [facility] - A Dept Of [facility]. [facility] Clinical Support from 02/12/2023 in [facility] - A Dept Of [place]. [facility] Office Visit from 12/30/2022 in [facility] - A Dept Of [facility] Bridegroom. [facility]  PHQ-2 Total Score 0 0 0 0 0  PHQ-9 Total Score 4 3 2 2 2       Flowsheet Row ED to Hosp-Admission (Discharged) from 04/03/2023 in [facility] ED from 04/02/2023 in [facility] Emergency Department at [facility] ED from 12/21/2021 in [facility] Department at [facility]  C-SSRS RISK CATEGORY No Risk No Risk No Risk        Assessment and Plan:  Patient is 63 year old man with history of A-fib, dizziness, fall, seizure disorder, hyperlipidemia, type 2 diabetes, chronic pain and tremors.  I reviewed his medication.  We have increased his Nardil 90 mg a day but his physician cut down to 60 mg.  Reason unknown.  Patient like to take higher dose but I explained cannot provide higher dose unless we have clearance from his PCP and cardiology.  He has a history of fall and dizziness.  Discussed polypharmacy and risk of sedation.  Patient agree to keep the current medicine as he also feels things are little bit better and is handling the  situation better than he anticipated.  Will continue Trilafon 24 mg a day, Depakote 500 mg twice a day, Nardil 60 mg a day and Cogentin 0.5 mg twice a day.  Recommended to call us back with any question or any concern.  Follow-up in 3 months.  His last Depakote level was 88 which was done in September.  Encouraged to keep appointment with his physician Dr. Lindwood Qua.   Collaboration of Care: Collaboration of Care: Other provider involved in patient's care AEB notes are available in epic to review  Patient/Guardian was advised Release of Information must be obtained prior to any record release in order to collaborate their care with an outside provider. Patient/Guardian was advised if they have not already done so to contact the registration department to sign all necessary forms in order for [place] to release information regarding their care.   Consent: Patient/Guardian gives verbal consent for treatment and assignment of benefits for services provided during this visit. Patient/Guardian expressed understanding and agreed to proceed.   I provided  31 minutes face-to-face time to the patient during this encounter.  Cleotis Nipper, MD 09/02/2023, 10:02 AM

## 2023-09-09 DIAGNOSIS — I4891 Unspecified atrial fibrillation: Secondary | ICD-10-CM | POA: Diagnosis not present

## 2023-09-09 DIAGNOSIS — F329 Major depressive disorder, single episode, unspecified: Secondary | ICD-10-CM | POA: Diagnosis not present

## 2023-09-09 DIAGNOSIS — Z7985 Long-term (current) use of injectable non-insulin antidiabetic drugs: Secondary | ICD-10-CM | POA: Diagnosis not present

## 2023-09-09 DIAGNOSIS — E119 Type 2 diabetes mellitus without complications: Secondary | ICD-10-CM | POA: Diagnosis not present

## 2023-09-09 DIAGNOSIS — Z9181 History of falling: Secondary | ICD-10-CM | POA: Diagnosis not present

## 2023-09-09 DIAGNOSIS — S12401D Unspecified nondisplaced fracture of fifth cervical vertebra, subsequent encounter for fracture with routine healing: Secondary | ICD-10-CM | POA: Diagnosis not present

## 2023-09-09 DIAGNOSIS — F259 Schizoaffective disorder, unspecified: Secondary | ICD-10-CM | POA: Diagnosis not present

## 2023-09-09 DIAGNOSIS — G40909 Epilepsy, unspecified, not intractable, without status epilepticus: Secondary | ICD-10-CM | POA: Diagnosis not present

## 2023-09-09 DIAGNOSIS — E039 Hypothyroidism, unspecified: Secondary | ICD-10-CM | POA: Diagnosis not present

## 2023-09-09 DIAGNOSIS — Z7901 Long term (current) use of anticoagulants: Secondary | ICD-10-CM | POA: Diagnosis not present

## 2023-09-09 NOTE — Telephone Encounter (Signed)
 [facility] is cancelling the dat scan order because they have made multiple attempts to schedule and the patient has never called them back.

## 2023-09-13 ENCOUNTER — Encounter: Payer: Self-pay | Admitting: Student

## 2023-09-13 ENCOUNTER — Ambulatory Visit (INDEPENDENT_AMBULATORY_CARE_PROVIDER_SITE_OTHER): Payer: Medicare HMO | Admitting: Student

## 2023-09-13 VITALS — BP 107/75 | HR 72 | Ht 70.0 in | Wt 227.4 lb

## 2023-09-13 DIAGNOSIS — M1611 Unilateral primary osteoarthritis, right hip: Secondary | ICD-10-CM | POA: Diagnosis not present

## 2023-09-13 DIAGNOSIS — E119 Type 2 diabetes mellitus without complications: Secondary | ICD-10-CM

## 2023-09-13 LAB — POCT GLYCOSYLATED HEMOGLOBIN (HGB A1C): HbA1c, POC (controlled diabetic range): 6.3 % (ref 0.0–7.0)

## 2023-09-13 MED ORDER — SEMAGLUTIDE(0.25 OR 0.5MG/DOS) 2 MG/3ML ~~LOC~~ SOPN
0.5000 mg | PEN_INJECTOR | SUBCUTANEOUS | 0 refills | Status: DC
Start: 2023-09-13 — End: 2023-09-13

## 2023-09-13 MED ORDER — SEMAGLUTIDE(0.25 OR 0.5MG/DOS) 2 MG/3ML ~~LOC~~ SOPN
PEN_INJECTOR | SUBCUTANEOUS | 1 refills | Status: AC
Start: 2023-09-13 — End: 2023-11-08

## 2023-09-13 NOTE — Assessment & Plan Note (Addendum)
 Well controlled on Trulicity.  Will attempt to switch to Ozempic per strong patient preference.  If unable to obtain Ozempic, will switch back to Trulicity. - Discontinue Trulicity 1.5 mg weekly - Start Ozempic 0.5 mg weekly, titrate - Follow-up in 6-8 weeks

## 2023-09-13 NOTE — Patient Instructions (Signed)
 It was great to see you! Thank you for allowing me to participate in your care!   I recommend that you always bring your medications to each appointment as this makes it easy to ensure we are on the correct medications and helps [place] not miss when refills are needed.  Our plans for today:  - I have printed a prescription for Ozempic, your insurance may not cover it. If not, please continue Trulicity. - I have sent a referral to sports medicine for your hip arthritis, they will call you to schedule an appointment - Follow-up with PCP in 6-8 week  Take care and seek immediate care sooner if you develop any concerns. Please remember to show up 15 minutes before your scheduled appointment time!  Tiffany Kocher, DO [facility]

## 2023-09-13 NOTE — Progress Notes (Signed)
    SUBJECTIVE:   CHIEF COMPLAINT / HPI:   T2DM Well-controlled with Trulicity.  However, patient is concerned that he is gaining weight.  Overall weight trend is stable.  Patient would strongly prefer restarting Ozempic.  Appears that in the past there was an issue obtaining Ozempic due to insurance change.  We will try to send Ozempic.  Right hip pain Hip pain worse on the right, worse when laying down and at night.  Feels limited range of motion.  Distant history of an injury several years ago when he fell in his hip.  Hip x-rays and 03/24/2020 showed moderate bilateral hip arthritis.  OBJECTIVE:   BP 107/75   Pulse 72   Ht 5\' 10"  (1.778 m)   Wt 227 lb 6.4 oz (103.1 kg)   SpO2 97%   BMI 32.63 kg/m    General: NAD, pleasant Cardio: RRR, no MRG. Cap Refill <2s. Respiratory: CTAB, normal wob on RA Right hip: No gross forming.  No TTP.  Severely limited internal rotation/adduction with FADIR as well as pain.  Limited external rotation/abduction with FABER as well as pain.  Straight leg negative for radiculopathy.   ASSESSMENT/PLAN:   Assessment & Plan Type 2 diabetes mellitus without complication, without long-term current use of insulin (HCC) Well controlled on Trulicity.  Will attempt to switch to Ozempic per strong patient preference.  If unable to obtain Ozempic, will switch back to Trulicity. - Discontinue Trulicity 1.5 mg weekly - Start Ozempic 0.5 mg weekly, titrate - Follow-up in 6-8 weeks Arthritis of right hip None right side hip arthritis.  Patient reports that his last sports medicine physician has retired.  Interested in seeing sports medicine for consideration of hip injections. - Referral to sports medicine   Tiffany Kocher, DO [facility]

## 2023-09-29 ENCOUNTER — Telehealth: Payer: Self-pay | Admitting: Pediatrics

## 2023-09-29 DIAGNOSIS — R1310 Dysphagia, unspecified: Secondary | ICD-10-CM

## 2023-09-29 DIAGNOSIS — K219 Gastro-esophageal reflux disease without esophagitis: Secondary | ICD-10-CM

## 2023-09-29 DIAGNOSIS — Z1211 Encounter for screening for malignant neoplasm of colon: Secondary | ICD-10-CM

## 2023-09-29 NOTE — Telephone Encounter (Signed)
 PT along with mother Annabelle Harman called to speak about his prep. They spoke to [facility] and they said they are waiting on the prescription. His procedure is on 10/07/2023. Please advise.

## 2023-09-29 NOTE — Telephone Encounter (Signed)
  Responder says: Thank you for contacting [facility], Your estimated wait time is 1-3 mins. Chantell N says:Is [birth year]? says:No. I'm sorry. The patient's birthday is 04-Dec-2060 Chantell N says:thank you , It looks like we also need the prescription insurance information if you have that Chantell N says: It will include a Rx Bin, Rx Group, Rx PCN, and Member ID says:AETNA MEDICARE AETNA MEDICARE HMO/PPO [id number] says:[id number] says:I don't have the Rx information Chantell N says:  How may I help today? says:  MEDICAID [place] [id number] S says:  this is secondary says:  The patient has not received anything from you guys. Their appt is 10/07/23 says:  Is there any reason why they have not been contacted? Chantell N says:  We called [phone] Chantell N says:  Is this number correct? says:  Yes. There is an alternate number [phone] Chantell N says:  We called that number the day we received the prescription 2/18 and also 2/19. Chantell N says:  Ok I will add that number as well Chantell N says:  and also today we called Chantell N says:  and left a voicemail says:  The patient says that they did speak to someone and that they were told that [facility] was waiting on the prescription from [place] Chantell N says:  We have had the prescription since 2/18 I am not showing any other communication besides the 2 automated calls and a callback request we received Chantell N says:  I will have a representative call again says:  Ok. I am calling them again now says:  There is an alternate number that his mother Annabelle Harman left [phone] says:  [phone] says:  I just called all of the numbers on file and this one and I have reached a vml on each one. I left 2 vmls to call you back Chantell N says:  ok thank you says:  Can you email me this chat for the patient's record

## 2023-09-29 NOTE — Telephone Encounter (Signed)
 I called patient on every number listed in his chart as well as the number listed for Annabelle Harman in the note.  I left a voicemail asking them to call [facility] or call us back.

## 2023-09-30 MED ORDER — NA SULFATE-K SULFATE-MG SULF 17.5-3.13-1.6 GM/177ML PO SOLN
1.0000 | Freq: Once | ORAL | 0 refills | Status: AC
Start: 2023-09-30 — End: 2023-09-30

## 2023-09-30 NOTE — Telephone Encounter (Signed)
 I spoke to [employer] and she advised that it is best that [name]'s prescriptions go to [facility] because they will deliver it to him at [place].  I explained to her that I will send the new prescription and I will send updated instructions to his MyChart.  She said that she will relay the information.

## 2023-09-30 NOTE — Telephone Encounter (Signed)
 Suprep sent to [facility].  I updated instructions for Keeon's colonoscopy prep and I sent it to MyChart.  I mailed a copy of the instructions to [place] today.

## 2023-09-30 NOTE — Telephone Encounter (Signed)
 PT returned call. Advise her of the CMA's recommendation. She was told by [facility] that they had not received a prescription yet and that it may be too early. Transferred to [facility]

## 2023-10-06 NOTE — Progress Notes (Unsigned)
 [facility] History and Physical   Primary Care Physician:  Tiffany Kocher, DO   Reason for Procedure:  GERD, dysphagia, colon cancer screening  Plan:  Upper endoscopy and colonoscopy       HPI: [name] is a 63 y.o. male undergoing upper endoscopy and colonoscopy for evaluation of symptoms of GERD, dysphagia and colorectal cancer screening.  Reports a history of GERD for several years.  Has had progressive solid food dysphagia.  Chart notes a prior history of Barrett's esophagus however no previous endoscopy reports are available.  He reports that his father had esophageal cancer and mother had Barrett's esophagus.  Recent barium esophagram showed mild to moderate esophageal dysmotility, transient retention of 13 mm barium tablet that subsequently passed.  No stricture, ring, web or hiatal hernia.  The patient has never had a colonoscopy.  No family history of colorectal cancer or polyps.  Notes incidental symptoms of constipation improving since incorporating fiber and vegetables into his diet.  [name] is on Eliquis for a history of atrial fibrillation-last dose**    Past Medical History:  Diagnosis Date   A-fib [facility])    Acute right-sided low back pain    Anxiety    Arrhythmia    Barrett esophagus    Chronic a-fib (HCC)    Closed fracture of left distal radius    Contact dermatitis 04/16/2017   Depression    Depression    Hypothyroidism    Low back pain    Morbid obesity, BMI unknown (HCC)    Obesity    Osteoarthritis    oa in bilateral knees   Schizophrenia (HCC)    Seizures (HCC)    Seizures (HCC)    Thyroid disease    Varicose veins     Past Surgical History:  Procedure Laterality Date   ACHILLES TENDON REPAIR  approx. 2004   right foot   ANTERIOR CERVICAL DECOMP/DISCECTOMY FUSION N/A 04/08/2023   Procedure: Anterior Cervical Discectomy Fusion Cervical Five-Cervical Six;  Surgeon: Lisbeth Renshaw, MD;  Location: [facility] OR;  Service:  Neurosurgery;  Laterality: N/A;   CATARACT EXTRACTION     bilateral   OPEN REDUCTION INTERNAL FIXATION (ORIF) DISTAL RADIAL FRACTURE Left 07/21/2018   Procedure: OPEN REDUCTION INTERNAL FIXATION (ORIF) DISTAL RADIAL FRACTURE;  Surgeon: Betha Loa, MD;  Location:  SURGERY CENTER;  Service: Orthopedics;  Laterality: Left;   stab phlebectomy  Right 11/09/2016   stab phlebectomy R leg by Josephina Gip MD   TOOTH EXTRACTION      Prior to Admission medications   Medication Sig Start Date End Date Taking? Authorizing Provider  acetaminophen (TYLENOL) 500 MG tablet Take 500-1,000 mg by mouth 2 (two) times daily as needed for mild pain. Patient not taking: Reported on 08/24/2023    [provider]  allopurinol (ZYLOPRIM) 300 MG tablet TAKE 1 TABLET BY MOUTH EVERY DAY Patient not taking: Reported on 08/24/2023 10/01/22   Maury Dus, MD  apixaban (ELIQUIS) 5 MG TABS tablet Take 1 tablet (5 mg total) by mouth 2 (two) times daily. 10/19/22   McDiarmid, Leighton Roach, MD  atorvastatin (LIPITOR) 40 MG tablet TAKE 1 TABLET BY MOUTH EVERY DAY 04/15/22   Lilland, Alana, DO  benztropine (COGENTIN) 0.5 MG tablet Take 1 tablet (0.5 mg total) by mouth 2 (two) times daily. 09/02/23   Arfeen, Phillips Grout, MD  calcium carbonate (TUMS - DOSED IN MG ELEMENTAL CALCIUM) 500 MG chewable tablet Chew 1 tablet (200 mg of elemental calcium total) by mouth daily  as needed for indigestion or heartburn. Patient not taking: Reported on 09/02/2023 04/16/23   Darral Dash, DO  divalproex (DEPAKOTE ER) 500 MG 24 hr tablet Take 1 tablet (500 mg total) by mouth 2 (two) times daily. 09/02/23   Arfeen, Phillips Grout, MD  levothyroxine (SYNTHROID) 125 MCG tablet Take 1 tablet (125 mcg total) by mouth daily. 04/16/23   Dameron, Nolberto Hanlon, DO  omeprazole (PRILOSEC) 40 MG capsule Take 1 capsule (40 mg total) by mouth daily. Patient not taking: Reported on 09/02/2023 07/13/23   Tiffany Kocher, DO  perphenazine (TRILAFON) 8 MG tablet Take 3  tablets (24 mg total) by mouth at bedtime. 09/02/23   Arfeen, Phillips Grout, MD  phenelzine (NARDIL) 15 MG tablet Take 3 tablets (45 mg total) by mouth 2 (two) times daily. Patient taking differently: Take 30 mg by mouth 2 (two) times daily. 07/22/23   Arfeen, Phillips Grout, MD  polyethylene glycol (MIRALAX / GLYCOLAX) 17 g packet Take 34 g by mouth daily. Patient not taking: Reported on 09/02/2023 04/16/23   Darral Dash, DO  Semaglutide,0.25 or 0.5MG /DOS, 2 MG/3ML SOPN Inject 0.5 mg into the skin once a week for 28 days, THEN 1 mg once a week for 28 days. 09/13/23 11/08/23  Tiffany Kocher, DO  senna-docusate (SENOKOT-S) 8.6-50 MG tablet Take 1 tablet by mouth at bedtime. 04/16/23   Darral Dash, DO    Current Outpatient Medications  Medication Sig Dispense Refill   acetaminophen (TYLENOL) 500 MG tablet Take 500-1,000 mg by mouth 2 (two) times daily as needed for mild pain. (Patient not taking: Reported on 08/24/2023)     allopurinol (ZYLOPRIM) 300 MG tablet TAKE 1 TABLET BY MOUTH EVERY DAY (Patient not taking: Reported on 08/24/2023) 90 tablet 1   apixaban (ELIQUIS) 5 MG TABS tablet Take 1 tablet (5 mg total) by mouth 2 (two) times daily. 180 tablet 2   atorvastatin (LIPITOR) 40 MG tablet TAKE 1 TABLET BY MOUTH EVERY DAY 90 tablet 1   benztropine (COGENTIN) 0.5 MG tablet Take 1 tablet (0.5 mg total) by mouth 2 (two) times daily. 60 tablet 2   calcium carbonate (TUMS - DOSED IN MG ELEMENTAL CALCIUM) 500 MG chewable tablet Chew 1 tablet (200 mg of elemental calcium total) by mouth daily as needed for indigestion or heartburn. (Patient not taking: Reported on 09/02/2023)     divalproex (DEPAKOTE ER) 500 MG 24 hr tablet Take 1 tablet (500 mg total) by mouth 2 (two) times daily. 60 tablet 2   levothyroxine (SYNTHROID) 125 MCG tablet Take 1 tablet (125 mcg total) by mouth daily.     omeprazole (PRILOSEC) 40 MG capsule Take 1 capsule (40 mg total) by mouth daily. (Patient not taking: Reported on 09/02/2023) 30 capsule  3   perphenazine (TRILAFON) 8 MG tablet Take 3 tablets (24 mg total) by mouth at bedtime. 90 tablet 2   phenelzine (NARDIL) 15 MG tablet Take 3 tablets (45 mg total) by mouth 2 (two) times daily. (Patient taking differently: Take 30 mg by mouth 2 (two) times daily.) 180 tablet 1   polyethylene glycol (MIRALAX / GLYCOLAX) 17 g packet Take 34 g by mouth daily. (Patient not taking: Reported on 09/02/2023)     Semaglutide,0.25 or 0.5MG /DOS, 2 MG/3ML SOPN Inject 0.5 mg into the skin once a week for 28 days, THEN 1 mg once a week for 28 days. 3 mL 1   senna-docusate (SENOKOT-S) 8.6-50 MG tablet Take 1 tablet by mouth at bedtime.     No current  facility-administered medications for this visit.    Allergies as of 10/07/2023 - Review Complete 09/02/2023  Allergen Reaction Noted   Lamictal [lamotrigine] Rash 06/12/2016   Metformin and related Rash 02/03/2016    Family History  Problem Relation Age of Onset   OCD Mother    Barrett's esophagus Mother    Diabetes Father    Heart disease Father    Esophageal cancer Father    Diabetes Brother    Liver disease Neg Hx    Colon cancer Neg Hx     Social History   Socioeconomic History   Marital status: Single    Spouse name: Not on file   Number of children: 0   Years of education: Not on file   Highest education level: Not on file  Occupational History   Occupation: disabled  Tobacco Use   Smoking status: Never   Smokeless tobacco: Never  Vaping Use   Vaping status: Never Used  Substance and Sexual Activity   Alcohol use: No    Alcohol/week: 0.0 standard drinks of alcohol   Drug use: No   Sexual activity: Never  Other Topics Concern   Not on file  Social History Narrative   Pt lives at [facility]    Pt retired    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: Low Risk  (02/13/2023)   Overall Financial Resource Strain (CARDIA)    Difficulty of Paying Living Expenses: Not hard at all  Food Insecurity: No  Food Insecurity (04/04/2023)   Hunger Vital Sign    Worried About Running Out of Food in the Last Year: Never true    Ran Out of Food in the Last Year: Never true  Transportation Needs: No Transportation Needs (04/04/2023)   PRAPARE - Administrator, Civil Service (Medical): No    Lack of Transportation (Non-Medical): No  Physical Activity: Inactive (02/13/2023)   Exercise Vital Sign    Days of Exercise per Week: 0 days    Minutes of Exercise per Session: 0 min  Stress: No Stress Concern Present (02/13/2023)   [employer] of Occupational Health - Occupational Stress Questionnaire    Feeling of Stress : Not at all  Social Connections: Socially Isolated (02/13/2023)   Social Connection and Isolation Panel [NHANES]    Frequency of Communication with Friends and Family: More than three times a week    Frequency of Social Gatherings with Friends and Family: Three times a week    Attends Religious Services: Never    Active Member of Clubs or Organizations: No    Attends Banker Meetings: Never    Marital Status: Never married  Intimate Partner Violence: Not At Risk (04/04/2023)   Humiliation, Afraid, Rape, and Kick questionnaire    Fear of Current or Ex-Partner: No    Emotionally Abused: No    Physically Abused: No    Sexually Abused: No    Review of Systems:  All other review of systems negative except as mentioned in the HPI.  Physical Exam: Vital signs There were no vitals taken for this visit.  General:   Alert,  Well-developed, well-nourished, pleasant and cooperative in NAD Airway:  Mallampati  Lungs:  Clear throughout to auscultation.   Heart:  Regular rate and rhythm; no murmurs, clicks, rubs,  or gallops. Abdomen:  Soft, nontender and nondistended. Normal bowel sounds.   Neuro/Psych:  Normal mood and affect. A and O x 3  Maren Beach, MD [facility]

## 2023-10-07 ENCOUNTER — Encounter: Payer: Self-pay | Admitting: Pediatrics

## 2023-10-07 ENCOUNTER — Ambulatory Visit: Payer: Medicare HMO | Admitting: Pediatrics

## 2023-10-07 VITALS — BP 147/82 | HR 60 | Temp 97.7°F | Resp 14 | Ht 70.0 in | Wt 226.0 lb

## 2023-10-07 DIAGNOSIS — F209 Schizophrenia, unspecified: Secondary | ICD-10-CM | POA: Diagnosis not present

## 2023-10-07 DIAGNOSIS — K648 Other hemorrhoids: Secondary | ICD-10-CM | POA: Diagnosis not present

## 2023-10-07 DIAGNOSIS — I4891 Unspecified atrial fibrillation: Secondary | ICD-10-CM | POA: Diagnosis not present

## 2023-10-07 DIAGNOSIS — K2270 Barrett's esophagus without dysplasia: Secondary | ICD-10-CM | POA: Diagnosis not present

## 2023-10-07 DIAGNOSIS — K635 Polyp of colon: Secondary | ICD-10-CM | POA: Diagnosis not present

## 2023-10-07 DIAGNOSIS — K319 Disease of stomach and duodenum, unspecified: Secondary | ICD-10-CM | POA: Diagnosis not present

## 2023-10-07 DIAGNOSIS — D124 Benign neoplasm of descending colon: Secondary | ICD-10-CM | POA: Diagnosis not present

## 2023-10-07 DIAGNOSIS — D123 Benign neoplasm of transverse colon: Secondary | ICD-10-CM | POA: Diagnosis not present

## 2023-10-07 DIAGNOSIS — D120 Benign neoplasm of cecum: Secondary | ICD-10-CM

## 2023-10-07 DIAGNOSIS — K219 Gastro-esophageal reflux disease without esophagitis: Secondary | ICD-10-CM | POA: Diagnosis not present

## 2023-10-07 DIAGNOSIS — D122 Benign neoplasm of ascending colon: Secondary | ICD-10-CM

## 2023-10-07 DIAGNOSIS — R1310 Dysphagia, unspecified: Secondary | ICD-10-CM

## 2023-10-07 DIAGNOSIS — K5730 Diverticulosis of large intestine without perforation or abscess without bleeding: Secondary | ICD-10-CM | POA: Diagnosis not present

## 2023-10-07 DIAGNOSIS — F419 Anxiety disorder, unspecified: Secondary | ICD-10-CM | POA: Diagnosis not present

## 2023-10-07 DIAGNOSIS — E039 Hypothyroidism, unspecified: Secondary | ICD-10-CM | POA: Diagnosis not present

## 2023-10-07 DIAGNOSIS — Z1211 Encounter for screening for malignant neoplasm of colon: Secondary | ICD-10-CM | POA: Diagnosis not present

## 2023-10-07 MED ORDER — SODIUM CHLORIDE 0.9 % IV SOLN: 500.0000 mL | INTRAVENOUS | Status: DC

## 2023-10-07 NOTE — Progress Notes (Signed)
 To pacu, VSS. Report to [email or web address].tb

## 2023-10-07 NOTE — Progress Notes (Signed)
 Called to room to assist during endoscopic procedure.  Patient ID and intended procedure confirmed with present staff. Received instructions for my participation in the procedure from the performing physician.

## 2023-10-07 NOTE — Op Note (Signed)
 [facility] Patient Name: [name] Procedure Date: 10/07/2023 1:50 PM MRN: [id number] Endoscopist: Maren Beach , MD, [id number] Age: 63 Referring MD:  Date of Birth: 1960/11/03 Gender: Male Account #: [id number] Procedure:                Upper GI endoscopy Indications:              Epigastric abdominal pain, Dysphagia, Follow-up of                            gastro-esophageal reflux disease, family history of                            Barrett's esophagus and esophageal cancer Medicines:                Monitored Anesthesia Care Procedure:                Pre-Anesthesia Assessment:                           - Prior to the procedure, a History and Physical                            was performed, and patient medications and                            allergies were reviewed. The patient's tolerance of                            previous anesthesia was also reviewed. The risks                            and benefits of the procedure and the sedation                            options and risks were discussed with the patient.                            All questions were answered, and informed consent                            was obtained. Prior Anticoagulants: The patient has                            taken Eliquis (apixaban), last dose was 2 days                            prior to procedure. ASA Grade Assessment: III - A                            patient with severe systemic disease. After                            reviewing the risks and benefits, the patient was  deemed in satisfactory condition to undergo the                            procedure.                           After obtaining informed consent, the endoscope was                            passed under direct vision. Throughout the                            procedure, the patient's blood pressure, pulse, and                            oxygen saturations were monitored  continuously. The                            GIF [id number] #[id number] was introduced through the                            mouth, and advanced to the second part of duodenum.                            The upper GI endoscopy was accomplished without                            difficulty. The patient tolerated the procedure                            well. Scope In: Scope Out: Findings:                 The upper third of the esophagus and middle third                            of the esophagus were normal.                           One tongue of salmon-colored mucosa was present. No                            other visible abnormalities were present. The                            maximum longitudinal extent of these esophageal                            mucosal changes was 1 cm in length. Biopsies were                            taken with a cold forceps for histology.                           No endoscopic abnormality was evident in the  esophagus to explain the patient's complaint of                            dysphagia. It was decided, however, to proceed with                            dilation of the entire esophagus. A guidewire was                            placed and the scope was withdrawn. Dilation was                            performed with a Savary dilator with mild                            resistance at 17 mm.                           Localized mild inflammation characterized by                            congestion (edema) and erythema was found in the                            prepyloric region of the stomach. Biopsies were                            taken with a cold forceps for Helicobacter pylori                            testing.                           The gastric body, gastric antrum, cardia (on                            retroflexion) and gastric fundus (on retroflexion)                            were normal. Biopsies were taken  with a cold                            forceps for Helicobacter pylori testing.                           The duodenal bulb and second portion of the                            duodenum were normal. Biopsies for histology were                            taken with a cold forceps for evaluation of celiac  disease. Complications:            No immediate complications. Estimated blood loss:                            Minimal. Estimated Blood Loss:     Estimated blood loss was minimal. Impression:               - Normal upper third of esophagus and middle third                            of esophagus.                           - Salmon-colored mucosa suspicious for                            short-segment Barrett's esophagus. Biopsied.                           - No endoscopic esophageal abnormality to explain                            patient's dysphagia. Esophagus dilated. Dilated.                           - Gastritis. Biopsied.                           - Normal gastric body, antrum, cardia and gastric                            fundus. Biopsied.                           - Normal duodenal bulb and second portion of the                            duodenum. Biopsied. Recommendation:           - Await pathology results.                           - Perform a colonoscopy today.                           - The findings and recommendations were discussed                            with the designated responsible adult. Maren Beach, MD 10/07/2023 3:42:03 PM This report has been signed electronically.

## 2023-10-07 NOTE — Op Note (Addendum)
 [facility] Patient Name: [name] Procedure Date: 10/07/2023 1:46 PM MRN: [id number] Endoscopist: Maren Beach , MD, [id number] Age: 63 Referring MD:  Date of Birth: Dec 17, 1960 Gender: Male Account #: [id number] Procedure:                Colonoscopy Indications:              Screening for colorectal malignant neoplasm, This                            is the patient's first colonoscopy Medicines:                Monitored Anesthesia Care Procedure:                Pre-Anesthesia Assessment:                           - Prior to the procedure, a History and Physical                            was performed, and patient medications and                            allergies were reviewed. The patient's tolerance of                            previous anesthesia was also reviewed. The risks                            and benefits of the procedure and the sedation                            options and risks were discussed with the patient.                            All questions were answered, and informed consent                            was obtained. Prior Anticoagulants: The patient has                            taken Eliquis (apixaban), last dose was 2 days                            prior to procedure. ASA Grade Assessment: III - A                            patient with severe systemic disease. After                            reviewing the risks and benefits, the patient was                            deemed in satisfactory condition to undergo the  procedure.                           After obtaining informed consent, the colonoscope                            was passed under direct vision. Throughout the                            procedure, the patient's blood pressure, pulse, and                            oxygen saturations were monitored continuously. The                            CF [id number] #[id number] was introduced through the anus                             and advanced to the cecum, identified by                            appendiceal orifice and ileocecal valve. The                            colonoscopy was somewhat difficult due to                            restricted mobility of the colon. The patient                            tolerated the procedure well. The quality of the                            bowel preparation was adequate to identify polyps                            greater than 5 mm in size. The ileocecal valve,                            appendiceal orifice, and rectum were photographed. Scope In: 2:34:02 PM Scope Out: 3:36:01 PM Scope Withdrawal Time: 0 hours 51 minutes 10 seconds  Total Procedure Duration: 1 hour 1 minute 59 seconds  Findings:                 The perianal and digital rectal examinations were                            normal. Pertinent negatives include normal                            sphincter tone and no palpable rectal lesions.                           A 7 mm polyp was found in the cecum. The polyp was  sessile. The polyp was removed with a hot snare.                            Resection and retrieval were complete.                           A 4 mm polyp was found in the cecum. The polyp was                            sessile. The polyp was removed with a cold snare.                            Resection and retrieval were complete.                           Five sessile polyps were found in the ascending                            colon. The polyps were 5 to 8 mm in size. These                            polyps were removed with a hot snare. Resection and                            retrieval were complete.                           A 15 mm polyp was found in the ascending colon. The                            polyp was sessile. The polyp was removed with a hot                            snare. The polyp was removed with a piecemeal                             technique using a hot snare. Resection and                            retrieval were complete. To prevent bleeding after                            the polypectomy, one hemostatic clip was                            successfully placed. There was no bleeding at the                            end of the procedure.                           Two sessile polyps were found in the transverse  colon. The polyps were 5 to 8 mm in size. These                            polyps were removed with a hot snare. Resection and                            retrieval were complete.                           A 3 mm polyp was found in the transverse colon. The                            polyp was sessile. The polyp was removed with a                            cold snare. Resection and retrieval were complete.                           Four sessile polyps were found in the descending                            colon. The polyps were 5 to 7 mm in size. These                            polyps were removed with a hot snare. Resection and                            retrieval were complete.                           A 5 mm polyp was found in the descending colon. The                            polyp was sessile. The polyp was removed with a                            cold snare. Resection and retrieval were complete.                           Multiple small-mouthed diverticula were found in                            the sigmoid colon and descending colon.                           Internal hemorrhoids were found during retroflexion. Complications:            No immediate complications. Estimated blood loss:                            Minimal. Estimated Blood Loss:     Estimated blood loss was minimal. Impression:               -  One 7 mm polyp in the cecum, removed with a hot                            snare. Resected and retrieved.                           - One 4 mm polyp in  the cecum, removed with a cold                            snare. Resected and retrieved.                           - Five 5 to 8 mm polyps in the ascending colon,                            removed with a hot snare. Resected and retrieved.                           - One 15 mm polyp in the ascending colon, removed                            with a hot snare and removed piecemeal using a hot                            snare. Resected and retrieved. Clip was placed.                           - Two 5 to 8 mm polyps in the transverse colon,                            removed with a hot snare. Resected and retrieved.                           - One 3 mm polyp in the transverse colon, removed                            with a cold snare. Resected and retrieved.                           - Four 5 to 7 mm polyps in the descending colon,                            removed with a hot snare. Resected and retrieved.                           - One 5 mm polyp in the descending colon, removed                            with a cold snare. Resected and retrieved.                           -  Diverticulosis in the sigmoid colon and in the                            descending colon.                           - Internal hemorrhoids. Recommendation:           - Discharge patient to home (ambulatory).                           - Await pathology results.                           - Repeat colonoscopy for surveillance based on                            pathology results.                           - Resume Eliquis (apixaban) at prior dose in 2 days.                           - The findings and recommendations were discussed                            with the designated responsible adult.                           - Patient has a contact number available for                            emergencies. The signs and symptoms of potential                            delayed complications were discussed with the                             patient. Return to normal activities tomorrow.                            Written discharge instructions were provided to the                            patient. Maren Beach, MD 10/07/2023 3:51:11 PM This report has been signed electronically. Addendum Number: 1   Addendum Date: 10/07/2023 5:51:01 PM      Correction to initial report: In the ascending colon, seven 5 to 8 mm       polyps were removed with hot snare and not five as initially reported. Maren Beach, MD 10/07/2023 5:52:51 PM This report has been signed electronically.

## 2023-10-07 NOTE — Patient Instructions (Signed)
 Clip Card. Please read handouts provided. Await pathology results. Resume previous diet. Resume Eliquis ( apixaban ) at prior dose on Sunday, October 10, 2023.    YOU HAD AN ENDOSCOPIC PROCEDURE TODAY AT THE [facility]:   Refer to the procedure report that was given to you for any specific questions about what was found during the examination.  If the procedure report does not answer your questions, please call your gastroenterologist to clarify.  If you requested that your care partner not be given the details of your procedure findings, then the procedure report has been included in a sealed envelope for you to review at your convenience later.  YOU SHOULD EXPECT: Some feelings of bloating in the abdomen. Passage of more gas than usual.  Walking can help get rid of the air that was put into your GI tract during the procedure and reduce the bloating. If you had a lower endoscopy (such as a colonoscopy or flexible sigmoidoscopy) you may notice spotting of blood in your stool or on the toilet paper. If you underwent a bowel prep for your procedure, you may not have a normal bowel movement for a few days.  Please Note:  You might notice some irritation and congestion in your nose or some drainage.  This is from the oxygen used during your procedure.  There is no need for concern and it should clear up in a day or so.  SYMPTOMS TO REPORT IMMEDIATELY:  Following lower endoscopy (colonoscopy or flexible sigmoidoscopy):  Excessive amounts of blood in the stool  Significant tenderness or worsening of abdominal pains  Swelling of the abdomen that is new, acute  Fever of 100F or higher  Following upper endoscopy (EGD)  Vomiting of blood or coffee ground material  New chest pain or pain under the shoulder blades  Painful or persistently difficult swallowing  New shortness of breath  Fever of 100F or higher  Black, tarry-looking stools  For urgent or emergent issues, a  gastroenterologist can be reached at any hour by calling [phone]. Do not use MyChart messaging for urgent concerns.    DIET:  We do recommend a small meal at first, but then you may proceed to your regular diet.  Drink plenty of fluids but you should avoid alcoholic beverages for 24 hours.  ACTIVITY:  You should plan to take it easy for the rest of today and you should NOT DRIVE or use heavy machinery until tomorrow (because of the sedation medicines used during the test).    FOLLOW UP: Our staff will call the number listed on your records the next business day following your procedure.  We will call around 7:15- 8:00 am to check on you and address any questions or concerns that you may have regarding the information given to you following your procedure. If we do not reach you, we will leave a message.     If any biopsies were taken you will be contacted by phone or by letter within the next 1-3 weeks.  Please call us at [phone] if you have not heard about the biopsies in 3 weeks.    SIGNATURES/CONFIDENTIALITY: You and/or your care partner have signed paperwork which will be entered into your electronic medical record.  These signatures attest to the fact that that the information above on your After Visit Summary has been reviewed and is understood.  Full responsibility of the confidentiality of this discharge information lies with you and/or your care-partner.

## 2023-10-07 NOTE — Progress Notes (Signed)
 Pt's states no medical or surgical changes since previsit or office visit.

## 2023-10-08 ENCOUNTER — Telehealth: Payer: Self-pay

## 2023-10-08 NOTE — Telephone Encounter (Signed)
 Attempted to reach patient for post-procedure f/u call. No answer. Left message for him to please not hesitate to call if he has any questions/concerns regarding his care.

## 2023-10-12 LAB — SURGICAL PATHOLOGY

## 2023-10-14 ENCOUNTER — Encounter: Payer: Self-pay | Admitting: Pediatrics

## 2023-10-18 ENCOUNTER — Other Ambulatory Visit: Payer: Self-pay

## 2023-10-18 ENCOUNTER — Emergency Department (HOSPITAL_COMMUNITY)
Admission: EM | Admit: 2023-10-18 | Discharge: 2023-10-19 | Discharge status: Against Medical Advice | Attending: Emergency Medicine | Admitting: Emergency Medicine

## 2023-10-18 DIAGNOSIS — K625 Hemorrhage of anus and rectum: Secondary | ICD-10-CM | POA: Diagnosis not present

## 2023-10-18 DIAGNOSIS — Z5321 Procedure and treatment not carried out due to patient leaving prior to being seen by health care provider: Secondary | ICD-10-CM | POA: Insufficient documentation

## 2023-10-18 DIAGNOSIS — K922 Gastrointestinal hemorrhage, unspecified: Secondary | ICD-10-CM | POA: Insufficient documentation

## 2023-10-18 DIAGNOSIS — R58 Hemorrhage, not elsewhere classified: Secondary | ICD-10-CM | POA: Diagnosis not present

## 2023-10-18 LAB — LIPASE, BLOOD: Lipase: 26 U/L (ref 11–51)

## 2023-10-18 LAB — CBC WITH DIFFERENTIAL/PLATELET
Abs Immature Granulocytes: 0.05 10*3/uL (ref 0.00–0.07)
Basophils Absolute: 0.1 10*3/uL (ref 0.0–0.1)
Basophils Relative: 1 %
Eosinophils Absolute: 0.7 10*3/uL — ABNORMAL HIGH (ref 0.0–0.5)
Eosinophils Relative: 7 %
HCT: 37.9 % — ABNORMAL LOW (ref 39.0–52.0)
Hemoglobin: 12.4 g/dL — ABNORMAL LOW (ref 13.0–17.0)
Immature Granulocytes: 1 %
Lymphocytes Relative: 24 %
Lymphs Abs: 2.3 10*3/uL (ref 0.7–4.0)
MCH: 30 pg (ref 26.0–34.0)
MCHC: 32.7 g/dL (ref 30.0–36.0)
MCV: 91.5 fL (ref 80.0–100.0)
Monocytes Absolute: 1 10*3/uL (ref 0.1–1.0)
Monocytes Relative: 10 %
Neutro Abs: 5.7 10*3/uL (ref 1.7–7.7)
Neutrophils Relative %: 57 %
Platelets: 215 10*3/uL (ref 150–400)
RBC: 4.14 MIL/uL — ABNORMAL LOW (ref 4.22–5.81)
RDW: 17.7 % — ABNORMAL HIGH (ref 11.5–15.5)
WBC: 9.8 10*3/uL (ref 4.0–10.5)
nRBC: 0 % (ref 0.0–0.2)

## 2023-10-18 LAB — COMPREHENSIVE METABOLIC PANEL WITH GFR
ALT: 7 U/L (ref 0–44)
AST: 25 U/L (ref 15–41)
Albumin: 3.5 g/dL (ref 3.5–5.0)
Alkaline Phosphatase: 79 U/L (ref 38–126)
Anion gap: 9 (ref 5–15)
BUN: 14 mg/dL (ref 8–23)
CO2: 24 mmol/L (ref 22–32)
Calcium: 8.9 mg/dL (ref 8.9–10.3)
Chloride: 104 mmol/L (ref 98–111)
Creatinine, Ser: 1.04 mg/dL (ref 0.61–1.24)
GFR, Estimated: >60 mL/min (ref >60)
Glucose, Bld: 154 mg/dL — ABNORMAL HIGH (ref 70–99)
Potassium: 4.6 mmol/L (ref 3.5–5.1)
Sodium: 137 mmol/L (ref 135–145)
Total Bilirubin: 1 mg/dL (ref 0.0–1.2)
Total Protein: 6.5 g/dL (ref 6.5–8.1)

## 2023-10-18 MED ORDER — PANTOPRAZOLE SODIUM 40 MG IV SOLR
40.0000 mg | Freq: Once | INTRAVENOUS | Status: AC
  Administered 2023-10-18: 40 mg via INTRAVENOUS
  Filled 2023-10-18: qty 10

## 2023-10-18 NOTE — ED Provider Triage Note (Signed)
 Emergency Medicine Provider Triage Evaluation Note  [name] , a 63 y.o. male  was evaluated in triage.  Pt complains of BRBPR x 2 days. Takes blood thinners for a fib. No rectal pain, nausea, vomiting or adominal pain. No chest pain or SOB.  Review of Systems  Positive:  Negative:   Physical Exam  BP 111/80 (BP Location: Right Arm)   Pulse 85   Temp 99 F (37.2 C) (Oral)   Resp 16   Ht 5\' 10"  (1.778 m)   Wt 102 kg   SpO2 96%   BMI 32.27 kg/m  Gen:   Awake, no distress   Resp:  Normal effort  MSK:   Moves extremities without difficulty  Other:    Medical Decision Making  Medically screening exam initiated at 9:50 PM.  Appropriate orders placed.  [name] was informed that the remainder of the evaluation will be completed by another provider, this initial triage assessment does not replace that evaluation, and the importance of remaining in the ED until their evaluation is complete.     Adel Aden, PA-C 10/18/23 2151

## 2023-10-18 NOTE — ED Triage Notes (Signed)
 BIB EMS from facility- bright red blood with clots in stool today. Pt thinks stool has been darker lately. Had colonoscopy 2 weeks ago.   EMS  129/70 99 pulse 98% RA

## 2023-10-19 ENCOUNTER — Telehealth: Payer: Self-pay | Admitting: Pediatrics

## 2023-10-19 NOTE — Telephone Encounter (Signed)
 Spoke with patient's mother & she stated patient had a small amount of rectal bleeding yesterday and was taken to ED, however he left AMA. He's only had a couple of bm's since procedure on 4/3 & has been more constipated. From her understanding is bleeding has resolved. Advised her I will call patient to check in & find out more information. She thanked me for the call.

## 2023-10-19 NOTE — ED Notes (Signed)
 PT left ama states has to work in the morning

## 2023-10-19 NOTE — Telephone Encounter (Signed)
 Spoke with patient & bleeding has resolved. Small amount of bleeding once yesterday with his bowel movement after straining d/t constipation. Denies any worsening abd pain. Advised him to continue omeprazole as prescribed at time of colon & monitor bleeding for now (he did have int hemorrhoids noted on report), and to call us  back if bleeding returns more frequently/larger amounts. Pt verbalized all understanding.

## 2023-10-19 NOTE — Telephone Encounter (Signed)
 Inbound call from patient's mother stating patient is currently at [facility] assisted living and was taking to the emergency room due to blood in stool. States patient has had 2 bowel movements since 4/3 colonoscopy. Requesting a call back to discuss further. Please advise, thank you.

## 2023-10-19 NOTE — Telephone Encounter (Signed)
 [name] mother of this patient called and stated that she would like for the nurse to give her a call regarding her son rectal bleeding. Mother stated that her son told her he had spoke to the nurse and was just is trying to figure out what recommendation were given. Mother of this patient is requesting a call back.Please advise.

## 2023-10-21 NOTE — Telephone Encounter (Signed)
 Spoke with mother & discussed the recommendations I discussed with patient on Tuesday. Pt will be staying with her for the next week. She will monitor bleeding as well & advised her to call back with any further questions/concerns.

## 2023-11-11 ENCOUNTER — Encounter: Payer: Self-pay | Admitting: Student

## 2023-11-11 ENCOUNTER — Ambulatory Visit: Admitting: Student

## 2023-11-11 VITALS — BP 115/74 | HR 72 | Ht 70.0 in | Wt 226.2 lb

## 2023-11-11 DIAGNOSIS — K22719 Barrett's esophagus with dysplasia, unspecified: Secondary | ICD-10-CM | POA: Diagnosis not present

## 2023-11-11 DIAGNOSIS — E119 Type 2 diabetes mellitus without complications: Secondary | ICD-10-CM

## 2023-11-11 DIAGNOSIS — Z23 Encounter for immunization: Secondary | ICD-10-CM

## 2023-11-11 MED ORDER — OZEMPIC (2 MG/DOSE) 8 MG/3ML ~~LOC~~ SOPN: 2.0000 mg | PEN_INJECTOR | SUBCUTANEOUS | 1 refills | Status: DC

## 2023-11-11 NOTE — Patient Instructions (Addendum)
 It was great to see you! Thank you for allowing me to participate in your care!   I recommend that you always bring your medications to each appointment as this makes it easy to ensure we are on the correct medications and helps us  not miss when refills are needed.  Our plans for today:  - Please take 2 mg of Ozempic  weekly - Please go to pharmacy for TDAP and Shingles vaccine - Today you received your pneumonia (Prevnar 20) vaccination - We are checking some labs today, I will call you if they are abnormal will send you a MyChart message or a letter if they are normal.  If you do not hear about your labs in the next 2 weeks please let us  know. - I recommend you speak with your psychiatrist about your Depakote  pills, to see if you can get 2 smaller pills.  Take care and seek immediate care sooner if you develop any concerns. Please remember to show up 15 minutes before your scheduled appointment time!  Lavada Porteous, DO [facility]

## 2023-11-11 NOTE — Assessment & Plan Note (Signed)
 Well-controlled, last A1c of 6.3.  Recently switched from Trulicity to Ozempic  per patient preference.  Has titrated Ozempic  to 1 mg, is now due for increase titration to 2 mg. -Titrate Ozempic  2 mg weekly - UACR today - A1c at next visit - Follow-up in 8 weeks

## 2023-11-11 NOTE — Progress Notes (Signed)
   SUBJECTIVE:   CHIEF COMPLAINT / HPI:   Type 2 diabetes Patient returns for follow-up of his type 2 diabetes.  Currently taking Ozempic  1 mg weekly, is now decreased 2 mg increase.  He is compliant and without side effects.  A1c in March was 6.3.  He is due for a UACR, he is agreeable.  Barrett's esophagus Patient experiencing dysphagia, was sent to GI.  Barium esophagram showed mild to moderate esophageal dysmotility and transient retention of 13 mm barium tablet that subsequently passed.  He underwent EGD which showed EGD showed intestinal metaplasia consistent with Barrett's esophagus.  He was recommended to continue his Meprazole 40 mg orally.  He will have repeat EGD in 2 years.  Health maintenance - Received Prevnar 20 today - Recommend Tdap and zoster vaccination, he will complete this at pharmacy - Patient was found to have multiple polyps (tubular adenomas) and recommended repeat colonoscopy in 1 year  OBJECTIVE:   BP 115/74   Pulse 72   Ht 5\' 10"  (1.778 m)   Wt 226 lb 4 oz (102.6 kg)   SpO2 97%   BMI 32.46 kg/m    General: NAD, pleasant, Cardio: RRR, no MRG. Cap Refill <2s. Respiratory: CTAB, normal wob on RA Skin: Warm and dry  ASSESSMENT/PLAN:   Assessment & Plan Type 2 diabetes mellitus without complication, without long-term current use of insulin  (HCC) Well-controlled, last A1c of 6.3.  Recently switched from Trulicity to Ozempic  per patient preference.  Has titrated Ozempic  to 1 mg, is now due for increase titration to 2 mg. -Titrate Ozempic  2 mg weekly - UACR today - A1c at next visit - Follow-up in 8 weeks Barrett's esophagus with dysplasia Seen by GI.  Will have repeat EGD in 3 years.  Does have some dysphagia with his typical pill, this is very large pill-recommend he speak with his psychiatrist about getting smaller pills. - Continue omeprazole  40 mg daily  Lavada Porteous, DO [facility]

## 2023-11-12 LAB — MICROALBUMIN / CREATININE URINE RATIO
Creatinine, Urine: 192.6 mg/dL
Microalb/Creat Ratio: 10 mg/g{creat} (ref 0–29)
Microalbumin, Urine: 19.1 ug/mL

## 2023-11-15 ENCOUNTER — Encounter: Payer: Self-pay | Admitting: Student

## 2023-11-17 ENCOUNTER — Other Ambulatory Visit: Payer: Self-pay | Admitting: Student

## 2023-11-17 NOTE — Progress Notes (Unsigned)
 Encounter opened in error.

## 2023-11-18 ENCOUNTER — Telehealth: Payer: Self-pay

## 2023-11-18 NOTE — Telephone Encounter (Signed)
 Patients mother calls nurse line requesting to speak with PCP.   She reports she would like to discuss the patients ability to drive.   She reports he has an active drivers license and owns a car, however has not driven since September. She reports upon release from the hospital he was told to not drive "for a while."   She reports he wants to go to the hockey games this weekend in [place] and drive himself.   She reports she has already reached out to Dr. Baldomero Levans office as well.   She would like PCP recommendation.   Advised will forward to PCP.

## 2023-11-19 NOTE — Telephone Encounter (Signed)
 Spoke with mother and gave PCP recommendation.  She agreed with plan.

## 2023-11-22 ENCOUNTER — Telehealth (HOSPITAL_COMMUNITY): Payer: Self-pay | Admitting: *Deleted

## 2023-11-22 NOTE — Telephone Encounter (Signed)
 I do not know what is the mother's concern that patient should not drive.  Either tremors or general physical health or psychiatric problems.  Unless we do not have more information cannot comment.  However he should see neurology for chronic tremors and also PCP for general health.  I will discuss with him on his next appointment.

## 2023-11-22 NOTE — Telephone Encounter (Signed)
 Pt's mother LVM requesting letter stating that [name] cannot/should not, drive. I see mother reached out to PCP and they referred her to Neurology however she continues to call us . Pt wants to drive to [place] to visit her and to see hockey games. He feels capable, she doesn't. Please advise.

## 2023-11-22 NOTE — Telephone Encounter (Signed)
 Please inform his PCP about his mother's concern.  I will talk to him on his next appointment.

## 2023-11-22 NOTE — Telephone Encounter (Signed)
 Pt's mother thinks he's not "mentally fit" to drive. He does not want to see neurology and hasn't seen them in awhile (06/14/23) after our recommendations to do so.

## 2023-12-02 ENCOUNTER — Encounter (HOSPITAL_COMMUNITY): Payer: Self-pay | Admitting: Psychiatry

## 2023-12-02 ENCOUNTER — Ambulatory Visit (HOSPITAL_BASED_OUTPATIENT_CLINIC_OR_DEPARTMENT_OTHER): Payer: Medicare HMO | Admitting: Psychiatry

## 2023-12-02 ENCOUNTER — Other Ambulatory Visit: Payer: Self-pay

## 2023-12-02 VITALS — BP 119/88 | HR 74 | Ht 70.5 in | Wt 218.0 lb

## 2023-12-02 DIAGNOSIS — R251 Tremor, unspecified: Secondary | ICD-10-CM | POA: Diagnosis not present

## 2023-12-02 DIAGNOSIS — F419 Anxiety disorder, unspecified: Secondary | ICD-10-CM | POA: Diagnosis not present

## 2023-12-02 DIAGNOSIS — F339 Major depressive disorder, recurrent, unspecified: Secondary | ICD-10-CM

## 2023-12-02 MED ORDER — DIVALPROEX SODIUM ER 500 MG PO TB24: 500.0000 mg | ORAL_TABLET | Freq: Two times a day (BID) | ORAL | 2 refills | Status: DC

## 2023-12-02 MED ORDER — PERPHENAZINE 8 MG PO TABS: 24.0000 mg | ORAL_TABLET | Freq: Every day | ORAL | 2 refills | Status: DC

## 2023-12-02 MED ORDER — PHENELZINE SULFATE 15 MG PO TABS: 45.0000 mg | ORAL_TABLET | Freq: Two times a day (BID) | ORAL | 2 refills | Status: DC

## 2023-12-02 MED ORDER — BENZTROPINE MESYLATE 0.5 MG PO TABS: 0.5000 mg | ORAL_TABLET | Freq: Two times a day (BID) | ORAL | 2 refills | Status: DC
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
## 2023-12-07 ENCOUNTER — Ambulatory Visit

## 2023-12-09 ENCOUNTER — Telehealth (HOSPITAL_COMMUNITY): Payer: Self-pay | Admitting: *Deleted
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
## 2023-12-13 NOTE — Telephone Encounter (Signed)
 Thanks for the update. He need to f/u with neurology and if neurologist is ok for him to drive than he he can discuss with his mother.

## 2023-12-20 ENCOUNTER — Telehealth: Payer: Self-pay

## 2023-12-20 NOTE — Telephone Encounter (Signed)
 Received call from nursing staff and [facility] regarding patient.   She reports that patient has been experiencing increased episodes of confusion and sleeping more than normal for the last week.   She is asking if provider would like labs and UA to be obtained.   She denies fever, painful urination, back/abdominal pain or malodorous urine. Denies stroke-like symptoms.   I advised that we would want patient to receive physical evaluation prior to orders being placed.   Scheduled patient with Dr. Leocadia Rains for tomorrow morning.   ED precautions discussed.   Elsie Halo, RN

## 2023-12-21 ENCOUNTER — Encounter: Payer: Self-pay | Admitting: Student

## 2023-12-21 ENCOUNTER — Emergency Department (HOSPITAL_COMMUNITY)

## 2023-12-21 ENCOUNTER — Observation Stay (HOSPITAL_COMMUNITY): Admission: EM | Admit: 2023-12-21 | Discharge: 2023-12-23 | Attending: Family Medicine | Admitting: Family Medicine

## 2023-12-21 ENCOUNTER — Ambulatory Visit: Payer: Self-pay | Admitting: Student

## 2023-12-21 ENCOUNTER — Other Ambulatory Visit: Payer: Self-pay

## 2023-12-21 ENCOUNTER — Ambulatory Visit (INDEPENDENT_AMBULATORY_CARE_PROVIDER_SITE_OTHER): Admitting: Student

## 2023-12-21 ENCOUNTER — Encounter (HOSPITAL_COMMUNITY): Payer: Self-pay | Admitting: Emergency Medicine

## 2023-12-21 VITALS — BP 116/82 | HR 84 | Ht 70.0 in | Wt 208.0 lb

## 2023-12-21 DIAGNOSIS — M109 Gout, unspecified: Secondary | ICD-10-CM | POA: Diagnosis not present

## 2023-12-21 DIAGNOSIS — R2689 Other abnormalities of gait and mobility: Secondary | ICD-10-CM | POA: Insufficient documentation

## 2023-12-21 DIAGNOSIS — E039 Hypothyroidism, unspecified: Secondary | ICD-10-CM | POA: Diagnosis not present

## 2023-12-21 DIAGNOSIS — E785 Hyperlipidemia, unspecified: Secondary | ICD-10-CM | POA: Diagnosis not present

## 2023-12-21 DIAGNOSIS — Z9181 History of falling: Secondary | ICD-10-CM | POA: Diagnosis not present

## 2023-12-21 DIAGNOSIS — Z789 Other specified health status: Secondary | ICD-10-CM

## 2023-12-21 DIAGNOSIS — F251 Schizoaffective disorder, depressive type: Secondary | ICD-10-CM | POA: Diagnosis not present

## 2023-12-21 DIAGNOSIS — R4182 Altered mental status, unspecified: Secondary | ICD-10-CM | POA: Diagnosis not present

## 2023-12-21 DIAGNOSIS — K2270 Barrett's esophagus without dysplasia: Secondary | ICD-10-CM | POA: Insufficient documentation

## 2023-12-21 DIAGNOSIS — R944 Abnormal results of kidney function studies: Secondary | ICD-10-CM | POA: Diagnosis not present

## 2023-12-21 DIAGNOSIS — E119 Type 2 diabetes mellitus without complications: Secondary | ICD-10-CM | POA: Diagnosis not present

## 2023-12-21 DIAGNOSIS — R5383 Other fatigue: Secondary | ICD-10-CM

## 2023-12-21 DIAGNOSIS — I6782 Cerebral ischemia: Secondary | ICD-10-CM | POA: Diagnosis not present

## 2023-12-21 DIAGNOSIS — R9431 Abnormal electrocardiogram [ECG] [EKG]: Secondary | ICD-10-CM | POA: Diagnosis not present

## 2023-12-21 DIAGNOSIS — R7989 Other specified abnormal findings of blood chemistry: Secondary | ICD-10-CM

## 2023-12-21 DIAGNOSIS — I4891 Unspecified atrial fibrillation: Secondary | ICD-10-CM | POA: Diagnosis not present

## 2023-12-21 DIAGNOSIS — R410 Disorientation, unspecified: Secondary | ICD-10-CM | POA: Diagnosis not present

## 2023-12-21 DIAGNOSIS — I10 Essential (primary) hypertension: Secondary | ICD-10-CM | POA: Diagnosis not present

## 2023-12-21 DIAGNOSIS — R000 Tachycardia, unspecified: Secondary | ICD-10-CM | POA: Diagnosis not present

## 2023-12-21 DIAGNOSIS — J9811 Atelectasis: Secondary | ICD-10-CM | POA: Diagnosis not present

## 2023-12-21 DIAGNOSIS — K5900 Constipation, unspecified: Secondary | ICD-10-CM | POA: Diagnosis not present

## 2023-12-21 LAB — CBC WITH DIFFERENTIAL/PLATELET
Abs Immature Granulocytes: 0.02 10*3/uL (ref 0.00–0.07)
Basophils Absolute: 0 10*3/uL (ref 0.0–0.1)
Basophils Relative: 0 %
Eosinophils Absolute: 0.9 10*3/uL — ABNORMAL HIGH (ref 0.0–0.5)
Eosinophils Relative: 11 %
HCT: 43 % (ref 39.0–52.0)
Hemoglobin: 13.8 g/dL (ref 13.0–17.0)
Immature Granulocytes: 0 %
Lymphocytes Relative: 17 %
Lymphs Abs: 1.4 10*3/uL (ref 0.7–4.0)
MCH: 29.6 pg (ref 26.0–34.0)
MCHC: 32.1 g/dL (ref 30.0–36.0)
MCV: 92.1 fL (ref 80.0–100.0)
Monocytes Absolute: 0.9 10*3/uL (ref 0.1–1.0)
Monocytes Relative: 11 %
Neutro Abs: 4.9 10*3/uL (ref 1.7–7.7)
Neutrophils Relative %: 61 %
Platelets: 141 10*3/uL — ABNORMAL LOW (ref 150–400)
RBC: 4.67 MIL/uL (ref 4.22–5.81)
RDW: 14.6 % (ref 11.5–15.5)
WBC: 8.1 10*3/uL (ref 4.0–10.5)
nRBC: 0 % (ref 0.0–0.2)

## 2023-12-21 LAB — COMPREHENSIVE METABOLIC PANEL WITH GFR
ALT: 5 U/L (ref 0–44)
AST: 14 U/L — ABNORMAL LOW (ref 15–41)
Albumin: 3.6 g/dL (ref 3.5–5.0)
Alkaline Phosphatase: 62 U/L (ref 38–126)
Anion gap: 12 (ref 5–15)
BUN: 19 mg/dL (ref 8–23)
CO2: 25 mmol/L (ref 22–32)
Calcium: 8.9 mg/dL (ref 8.9–10.3)
Chloride: 100 mmol/L (ref 98–111)
Creatinine, Ser: 1.16 mg/dL (ref 0.61–1.24)
GFR, Estimated: >60 mL/min (ref >60)
Glucose, Bld: 104 mg/dL — ABNORMAL HIGH (ref 70–99)
Potassium: 4.1 mmol/L (ref 3.5–5.1)
Sodium: 137 mmol/L (ref 135–145)
Total Bilirubin: 0.7 mg/dL (ref 0.0–1.2)
Total Protein: 6.1 g/dL — ABNORMAL LOW (ref 6.5–8.1)

## 2023-12-21 LAB — T4, FREE: Free T4: 1.43 ng/dL — ABNORMAL HIGH (ref 0.61–1.12)

## 2023-12-21 LAB — POCT GLYCOSYLATED HEMOGLOBIN (HGB A1C): HbA1c, POC (controlled diabetic range): 5.5 % (ref 0.0–7.0)

## 2023-12-21 LAB — VALPROIC ACID LEVEL: Valproic Acid Lvl: 112 ug/mL — ABNORMAL HIGH (ref 50–100)

## 2023-12-21 MED ORDER — SEMAGLUTIDE (1 MG/DOSE) 4 MG/3ML ~~LOC~~ SOPN: 1.0000 mg | PEN_INJECTOR | SUBCUTANEOUS | 0 refills | Status: DC

## 2023-12-21 NOTE — ED Notes (Signed)
 Patient transported to CT

## 2023-12-21 NOTE — ED Triage Notes (Signed)
 Pt states that he feels confused and per staff at [facility] he is more confused. Pt went to his PCP for this and doesn't remember what they said. This started 2 days ago.

## 2023-12-21 NOTE — Patient Instructions (Addendum)
 It was great to see you! Thank you for allowing me to participate in your care!   Our plans for today:  - 1 week follow up  - We will get labs today - decreasing semaglutide  to 1 mg instead of 2 mg - Got to ED if having fevers + confusion or worsening  Take care and seek immediate care sooner if you develop any concerns.  Genora Kidd, MD

## 2023-12-21 NOTE — Assessment & Plan Note (Signed)
 A1c 5.5 -Decrease semaglutide  to 1 mg weekly (possibly contributing to fatigue/not intaking enough for energy levels)

## 2023-12-21 NOTE — ED Provider Notes (Signed)
 [place] EMERGENCY DEPARTMENT AT [facility] Provider Note   CSN: [id number] Arrival date & time: 12/21/23  2152     Patient presents with: Altered Mental Status   [name] is a 63 y.o. male.   The history is provided by the patient and medical records.  Altered Mental Status  63 y.o. M with history of seizure disorder, depression, hypothyroidism, schizophrenia, dermatitis, history of A-fib on Eliquis , presenting to the ED for altered mental status.  Apparently has been confused for about 2 days now.  Was seen at PCPs office today and had blood work drawn but facility reports he is even worse now so sent him in for more urgent evaluation.  Patient denies any falls or head trauma.  Not had any fever or chills.  No nausea, vomiting, abdominal pain, chest pain, or shortness of breath.  Patient states he is just not sure what is going on.  Prior to Admission medications   Medication Sig Start Date End Date Taking? Authorizing Provider  acetaminophen  (TYLENOL ) 500 MG tablet Take 500-1,000 mg by mouth 2 (two) times daily as needed for mild pain. Patient not taking: Reported on 08/24/2023    [provider]  allopurinol  (ZYLOPRIM ) 300 MG tablet TAKE 1 TABLET BY MOUTH EVERY DAY 10/01/22   Edsel Grace, MD  apixaban  (ELIQUIS ) 5 MG TABS tablet Take 1 tablet (5 mg total) by mouth 2 (two) times daily. 10/19/22   McDiarmid, Demetra Filter, MD  atorvastatin  (LIPITOR) 40 MG tablet TAKE 1 TABLET BY MOUTH EVERY DAY 04/15/22   Lilland, Alana, DO  benztropine  (COGENTIN ) 0.5 MG tablet Take 1 tablet (0.5 mg total) by mouth 2 (two) times daily. 12/02/23   Arfeen, Bronson Canny, MD  calcium  carbonate (TUMS - DOSED IN MG ELEMENTAL CALCIUM ) 500 MG chewable tablet Chew 1 tablet (200 mg of elemental calcium  total) by mouth daily as needed for indigestion or heartburn. 04/16/23   Dameron, Marisa, DO  divalproex  (DEPAKOTE  ER) 500 MG 24 hr tablet Take 1 tablet (500 mg total) by mouth 2 (two) times daily.  12/02/23   Arfeen, Bronson Canny, MD  levothyroxine  (SYNTHROID ) 125 MCG tablet Take 1 tablet (125 mcg total) by mouth daily. 04/16/23   Dameron, Marisa, DO  omeprazole  (PRILOSEC) 40 MG capsule Take 1 capsule (40 mg total) by mouth daily. Patient not taking: Reported on 12/02/2023 07/13/23   Lavada Porteous, DO  perphenazine  (TRILAFON ) 8 MG tablet Take 3 tablets (24 mg total) by mouth at bedtime. 12/02/23   Arfeen, Bronson Canny, MD  phenelzine  (NARDIL ) 15 MG tablet Take 3 tablets (45 mg total) by mouth 2 (two) times daily. 12/02/23   Arfeen, Bronson Canny, MD  polyethylene glycol (MIRALAX  / GLYCOLAX ) 17 g packet Take 34 g by mouth daily. Patient not taking: Reported on 12/02/2023 04/16/23   Dameron, Marisa, DO  Semaglutide , 1 MG/DOSE, 4 MG/3ML SOPN Inject 1 mg into the skin once a week. 12/21/23   Genora Kidd, MD  senna-docusate (SENOKOT-S) 8.6-50 MG tablet Take 1 tablet by mouth at bedtime. 04/16/23   Dameron, Marisa, DO    Allergies: Lamictal  [lamotrigine ] and Metformin  and related    Review of Systems  Neurological:        Confusion  All other systems reviewed and are negative.   Updated Vital Signs BP 115/80 (BP Location: Right Arm)   Pulse 88   Temp 98.7 F (37.1 C) (Oral)   Resp 16   SpO2 97%   Physical Exam Vitals and nursing  note reviewed.  Constitutional:      Appearance: He is well-developed.  HENT:     Head: Normocephalic and atraumatic.     Comments: Dermatitis of the face present but no signs of acute head trauma  Eyes:     Conjunctiva/sclera: Conjunctivae normal.     Pupils: Pupils are equal, round, and reactive to light.    Cardiovascular:     Rate and Rhythm: Normal rate and regular rhythm.     Heart sounds: Normal heart sounds.  Pulmonary:     Effort: Pulmonary effort is normal.     Breath sounds: Normal breath sounds.  Abdominal:     General: Bowel sounds are normal.     Palpations: Abdomen is soft.   Musculoskeletal:        General: Normal range of motion.     Cervical  back: Normal range of motion.   Skin:    General: Skin is warm and dry.   Neurological:     Mental Status: He is alert.     Comments: Able to state name and DOB, aware of surroundings, able to state he is at hospital, unsure on date/year, moving extremities well, no focal deficits    (all labs ordered are listed, but only abnormal results are displayed) Labs Reviewed  CBC WITH DIFFERENTIAL/PLATELET - Abnormal; Notable for the following components:      Result Value   Platelets 141 (*)    Eosinophils Absolute 0.9 (*)    All other components within normal limits  COMPREHENSIVE METABOLIC PANEL WITH GFR - Abnormal; Notable for the following components:   Glucose, Bld 104 (*)    Total Protein 6.1 (*)    AST 14 (*)    All other components within normal limits  VALPROIC ACID  LEVEL - Abnormal; Notable for the following components:   Valproic Acid  Lvl 112 (*)    All other components within normal limits  T4, FREE - Abnormal; Notable for the following components:   Free T4 1.43 (*)    All other components within normal limits  TSH  URINALYSIS, W/ REFLEX TO CULTURE (INFECTION SUSPECTED)    EKG: None  Radiology: DG Chest 2 View Result Date: 12/21/2023 CLINICAL DATA:  Altered mental status. EXAM: CHEST - 2 VIEW COMPARISON:  April 02, 2023 FINDINGS: The heart size and mediastinal contours are within normal limits. Mild atelectatic changes are seen within the right lung base. Mild left basilar linear scarring is noted. No pleural effusion or pneumothorax is identified. Postoperative changes are seen within the cervical spine with multilevel degenerative changes noted throughout the thoracic spine. IMPRESSION: Mild right basilar atelectasis. Electronically Signed   By: Virgle Grime M.D.   On: 12/21/2023 22:35   CT Head Wo Contrast Result Date: 12/21/2023 EXAM: CT HEAD WITHOUT CONTRAST 12/21/2023 10:20:37 PM TECHNIQUE: CT of the head was performed without the administration of  intravenous contrast. Automated exposure control, iterative reconstruction, and/or weight based adjustment of the mA/kV was utilized to reduce the radiation dose to as low as reasonably achievable. COMPARISON: 04/03/2023 CLINICAL HISTORY: Delirium. Pt states that he feels confused and per staff at [facility] he is more confused. Pt went to his PCP for this and doesn't remember what they said. This started 2 days ago. FINDINGS: BRAIN AND VENTRICLES: There is no acute intracranial hemorrhage, mass effect or midline shift. No abnormal extra-axial fluid collection. The gray-white differentiation is maintained without an acute infarct. There is no hydrocephalus. Global cortical atrophy. Subcortical and periventricular small vessel  ischemic changes. ORBITS: The visualized portion of the orbits demonstrate no acute abnormality. SINUSES: The visualized paranasal sinuses and mastoid air cells demonstrate no acute abnormality. SOFT TISSUES AND SKULL: No acute abnormality of the visualized skull or soft tissues. IMPRESSION: 1. No acute intracranial abnormality. 2. Global cortical atrophy with small vessel ischemic changes. Electronically signed by: Zadie Herter MD 12/21/2023 10:27 PM EDT RP Workstation: [id number]     Procedures   Medications Ordered in the ED - No data to display                                  Medical Decision Making Amount and/or Complexity of Data Reviewed Labs: ordered. Radiology: ordered and independent interpretation performed. ECG/medicine tests: ordered and independent interpretation performed.  Risk Decision regarding hospitalization.   63 year old male presenting to the ED with altered mental status.  Seen by PCP at family practice center this morning, sent in this evening by facility as he seemed worse.  Patient does not really have a lot of recollection of what was said during his PCP appointment.  He is able to state name and where he is but confused about day and cannot  give me a lot further information.  He just keeps saying he does not understand what is happening.  He does not have any visible signs of head trauma, he denies any falls.  He is moving all of his extremities well, does follow commands when prompted.  He is not have any focal deficits at this time.  He is anticoagulated with Eliquis  so will obtain CT head, chest x-ray, screening labs, UA.  EKG without acute abnormalities.  CT head without acute findings.  Chest x-ray is clear.  Labs without leukocytosis or significant electrolyte abnormality.  His Depakote  level is slightly high at 112, question if this may be contributing.  He remains hemodynamically stable here.  Will discuss with family practice for admission.  Spoke with Family practice residents-- will admit for ongoing care.  Final diagnoses:  Altered mental status, unspecified altered mental status type    ED Discharge Orders     None          Coretha Dew, PA-C 12/22/23 0118    Tegeler, Marine Sia, MD 12/23/23 0003

## 2023-12-21 NOTE — Progress Notes (Signed)
    SUBJECTIVE:   CHIEF COMPLAINT / HPI:   Discussed the use of AI scribe software for clinical note transcription with the patient, who gave verbal consent to proceed.  History of Present Illness [name] is a 63 year old male who presents with confusion and shortness of breath.  Confusion has been present for three days, with a sensation of being in an unfamiliar environment despite being at home.  States that he feels like every single I am in his room has been replaced with a similar item and felt like he is in someone else's apartment.  Is unable to demonstrate other forms of confusion over the past few days.  States that he has possibly had some shortness of breath that has been occurring for a few days, both at home and work, especially during ambulation.  No productive cough.  Urination is darker, without dysuria. No fever, abdominal pain, or throat pain.  RN Call: Received call from nursing staff and [facility] regarding patient.  She reports that patient has been experiencing increased episodes of confusion and sleeping more than normal for the last week.  She is asking if provider would like labs and UA to be obtained.  She denies fever, painful urination, back/abdominal pain or malodorous urine. Denies stroke-like symptoms.  I advised that we would want patient to receive physical evaluation prior to orders being placed.  Scheduled patient with Dr. Leocadia Rains for tomorrow morning.  ED precautions discussed.  PERTINENT  PMH / PSH: PAF, HLD, T2DM, multiple falls, schizoaffective disorder  OBJECTIVE:   BP 116/82   Pulse 84   Ht 5' 10 (1.778 m)   Wt 208 lb (94.3 kg)   SpO2 96%   BMI 29.84 kg/m   General: Well appearing, NAD, awake, alert, responsive to questions Head: Normocephalic  CV: Regular rate and rhythm no murmurs rubs or gallops Respiratory: Clear to ausculation bilaterally, no wheezes rales or crackles, chest rises symmetrically,  no increased work of  breathing Abdomen: Soft, non-tender, non-distended, normoactive bowel sounds, no suprapubic or CVA tenderness Extremities: Moves upper and lower extremities freely, no edema in LE Neuro: CN II: PERRL CN III, IV,VI: EOMI CV V: Normal sensation in V1, V2, V3 CVII: Symmetric smile and brow raise CN VIII: Normal hearing CN IX,X: Symmetric palate raise  CN XI: 5/5 shoulder shrug CN XII: Symmetric tongue protrusion  UE and LE strength symmetric Normal sensation in UE and LE bilaterally   ASSESSMENT/PLAN:   Assessment & Plan Fatigue, unspecified type Episodes of confusion and disorientation . Differential includes psychiatric cause (given thoughts of everything replicated in room), medication side effects ( 226>208 lbs in past month could be contributing to fatigue), infection (no fevers or systemic sxs), or metabolic disturbance. Neuro exam benign. - Order CBC and BMP. - Valproate level - Decrease semaglutide  to 1 mg weekly - Encourage increased fluid intake - 1-2 week PCP f/u - ED/return precautions Type 2 diabetes mellitus without complication, without long-term current use of insulin  (HCC) A1c 5.5 -Decrease semaglutide  to 1 mg weekly (possibly contributing to fatigue/not intaking enough for energy levels)   Genora Kidd, MD [facility]

## 2023-12-22 ENCOUNTER — Observation Stay (HOSPITAL_COMMUNITY)

## 2023-12-22 DIAGNOSIS — R4182 Altered mental status, unspecified: Secondary | ICD-10-CM | POA: Diagnosis not present

## 2023-12-22 DIAGNOSIS — Z789 Other specified health status: Secondary | ICD-10-CM

## 2023-12-22 DIAGNOSIS — R7989 Other specified abnormal findings of blood chemistry: Secondary | ICD-10-CM | POA: Diagnosis not present

## 2023-12-22 DIAGNOSIS — R9089 Other abnormal findings on diagnostic imaging of central nervous system: Secondary | ICD-10-CM | POA: Diagnosis not present

## 2023-12-22 DIAGNOSIS — F251 Schizoaffective disorder, depressive type: Secondary | ICD-10-CM | POA: Diagnosis not present

## 2023-12-22 DIAGNOSIS — R410 Disorientation, unspecified: Secondary | ICD-10-CM | POA: Diagnosis not present

## 2023-12-22 LAB — COMPREHENSIVE METABOLIC PANEL WITH GFR
ALT: 6 IU/L (ref 0–44)
AST: 14 IU/L (ref 0–40)
Albumin: 4.5 g/dL (ref 3.9–4.9)
Alkaline Phosphatase: 94 IU/L (ref 44–121)
BUN/Creatinine Ratio: 14 (ref 10–24)
BUN: 18 mg/dL (ref 8–27)
Bilirubin Total: 0.5 mg/dL (ref 0.0–1.2)
CO2: 23 mmol/L (ref 20–29)
Calcium: 9.7 mg/dL (ref 8.6–10.2)
Chloride: 95 mmol/L — ABNORMAL LOW (ref 96–106)
Creatinine, Ser: 1.25 mg/dL (ref 0.76–1.27)
Globulin, Total: 2.3 g/dL (ref 1.5–4.5)
Glucose: 79 mg/dL (ref 70–99)
Potassium: 4.2 mmol/L (ref 3.5–5.2)
Sodium: 139 mmol/L (ref 134–144)
Total Protein: 6.8 g/dL (ref 6.0–8.5)
eGFR: 64 mL/min/{1.73_m2} (ref >59)

## 2023-12-22 LAB — RPR: RPR Ser Ql: NONREACTIVE

## 2023-12-22 LAB — CBC WITH DIFFERENTIAL/PLATELET
Basophils Absolute: 0.1 10*3/uL (ref 0.0–0.2)
Basos: 1 %
EOS (ABSOLUTE): 1 10*3/uL — ABNORMAL HIGH (ref 0.0–0.4)
Eos: 10 %
Hematocrit: 47.4 % (ref 37.5–51.0)
Hemoglobin: 15.2 g/dL (ref 13.0–17.7)
Immature Grans (Abs): 0 10*3/uL (ref 0.0–0.1)
Immature Granulocytes: 0 %
Lymphocytes Absolute: 1.7 10*3/uL (ref 0.7–3.1)
Lymphs: 17 %
MCH: 29.9 pg (ref 26.6–33.0)
MCHC: 32.1 g/dL (ref 31.5–35.7)
MCV: 93 fL (ref 79–97)
Monocytes Absolute: 1 10*3/uL — ABNORMAL HIGH (ref 0.1–0.9)
Monocytes: 10 %
Neutrophils Absolute: 6.4 10*3/uL (ref 1.4–7.0)
Neutrophils: 62 %
Platelets: 170 10*3/uL (ref 150–450)
RBC: 5.09 x10E6/uL (ref 4.14–5.80)
RDW: 13.7 % (ref 11.6–15.4)
WBC: 10 10*3/uL (ref 3.4–10.8)

## 2023-12-22 LAB — BASIC METABOLIC PANEL WITH GFR
Anion gap: 10 (ref 5–15)
BUN: 15 mg/dL (ref 8–23)
CO2: 26 mmol/L (ref 22–32)
Calcium: 9 mg/dL (ref 8.9–10.3)
Chloride: 101 mmol/L (ref 98–111)
Creatinine, Ser: 0.84 mg/dL (ref 0.61–1.24)
GFR, Estimated: >60 mL/min (ref >60)
Glucose, Bld: 93 mg/dL (ref 70–99)
Potassium: 3.3 mmol/L — ABNORMAL LOW (ref 3.5–5.1)
Sodium: 137 mmol/L (ref 135–145)

## 2023-12-22 LAB — VALPROIC ACID LEVEL: Valproic Acid Lvl: 110 ug/mL — ABNORMAL HIGH (ref 50–100)

## 2023-12-22 LAB — TROPONIN I (HIGH SENSITIVITY): Troponin I (High Sensitivity): 12 ng/L (ref <18)

## 2023-12-22 LAB — TSH: TSH: 0.406 u[IU]/mL (ref 0.350–4.500)

## 2023-12-22 LAB — AMMONIA: Ammonia: <13 umol/L (ref 9–35)

## 2023-12-22 LAB — VITAMIN B12: Vitamin B-12: 572 pg/mL (ref 180–914)

## 2023-12-22 MED ORDER — ALLOPURINOL 300 MG PO TABS
300.0000 mg | ORAL_TABLET | Freq: Every day | ORAL | Status: DC
  Administered 2023-12-22 – 2023-12-23 (×2): 300 mg via ORAL
  Filled 2023-12-22 (×2): qty 1

## 2023-12-22 MED ORDER — APIXABAN 5 MG PO TABS
5.0000 mg | ORAL_TABLET | Freq: Two times a day (BID) | ORAL | Status: DC
  Administered 2023-12-22 – 2023-12-23 (×3): 5 mg via ORAL
  Filled 2023-12-22 (×3): qty 1

## 2023-12-22 MED ORDER — DIVALPROEX SODIUM ER 500 MG PO TB24
500.0000 mg | ORAL_TABLET | Freq: Two times a day (BID) | ORAL | Status: DC
  Administered 2023-12-23: 500 mg via ORAL
  Filled 2023-12-22 (×2): qty 1

## 2023-12-22 MED ORDER — ACETAMINOPHEN 650 MG RE SUPP: 650.0000 mg | Freq: Four times a day (QID) | RECTAL | Status: DC | PRN

## 2023-12-22 MED ORDER — BENZTROPINE MESYLATE 0.5 MG PO TABS
0.5000 mg | ORAL_TABLET | Freq: Two times a day (BID) | ORAL | Status: DC
  Administered 2023-12-22 – 2023-12-23 (×3): 0.5 mg via ORAL
  Filled 2023-12-22 (×4): qty 1

## 2023-12-22 MED ORDER — LACTATED RINGERS IV BOLUS
1000.0000 mL | Freq: Once | INTRAVENOUS | Status: AC
  Administered 2023-12-22: 1000 mL via INTRAVENOUS

## 2023-12-22 MED ORDER — ATORVASTATIN CALCIUM 40 MG PO TABS
40.0000 mg | ORAL_TABLET | Freq: Every day | ORAL | Status: DC
  Administered 2023-12-22: 40 mg via ORAL
  Filled 2023-12-22: qty 1

## 2023-12-22 MED ORDER — ACETAMINOPHEN 325 MG PO TABS: 650.0000 mg | ORAL_TABLET | Freq: Four times a day (QID) | ORAL | Status: DC | PRN

## 2023-12-22 MED ORDER — PERPHENAZINE 4 MG PO TABS
24.0000 mg | ORAL_TABLET | Freq: Every day | ORAL | Status: DC
  Administered 2023-12-22: 24 mg via ORAL
  Filled 2023-12-22 (×2): qty 6

## 2023-12-22 MED ORDER — PHENELZINE SULFATE 15 MG PO TABS
45.0000 mg | ORAL_TABLET | Freq: Two times a day (BID) | ORAL | Status: DC
  Administered 2023-12-22 – 2023-12-23 (×3): 45 mg via ORAL
  Filled 2023-12-22 (×4): qty 3

## 2023-12-22 MED ORDER — DIVALPROEX SODIUM ER 500 MG PO TB24
500.0000 mg | ORAL_TABLET | Freq: Two times a day (BID) | ORAL | Status: DC
  Administered 2023-12-22: 500 mg via ORAL
  Filled 2023-12-22 (×2): qty 1

## 2023-12-22 MED ORDER — POTASSIUM CHLORIDE CRYS ER 20 MEQ PO TBCR
40.0000 meq | EXTENDED_RELEASE_TABLET | Freq: Two times a day (BID) | ORAL | Status: AC
  Administered 2023-12-22 (×2): 40 meq via ORAL
  Filled 2023-12-22 (×2): qty 2

## 2023-12-22 MED ORDER — PANTOPRAZOLE SODIUM 40 MG PO TBEC
80.0000 mg | DELAYED_RELEASE_TABLET | Freq: Every day | ORAL | Status: DC
  Administered 2023-12-22 – 2023-12-23 (×2): 80 mg via ORAL
  Filled 2023-12-22 (×2): qty 2

## 2023-12-22 MED ORDER — LEVOTHYROXINE SODIUM 25 MCG PO TABS
125.0000 ug | ORAL_TABLET | Freq: Every day | ORAL | Status: DC
  Administered 2023-12-22 – 2023-12-23 (×2): 125 ug via ORAL
  Filled 2023-12-22 (×2): qty 1

## 2023-12-22 NOTE — Assessment & Plan Note (Addendum)
 With history and negative workup so far, AMS likely secondary to worsening dementia or schizoaffective disorder.  Appears to be at baseline today, also spoke with his mother who reports that she did not think he altered, just intermittently more anxious/paranoid. Consulted psychiatry who did not recommend any changes to his current medications.  Could consider holding a dose of Depakote  to obtain true trough.  Otherwise if still at baseline would recommend outpatient neurology follow-up. - Regular diet  - PT/OT to treat - Fall precautions - Delirium precautions - If mental status worsens tomorrow consider neurology consult for neuro degeneration.

## 2023-12-22 NOTE — NC FL2 (Cosign Needed)
 [place]  MEDICAID [id number] LEVEL OF CARE FORM     IDENTIFICATION  Patient Name: [name] Birthdate: 1960-09-26 Sex: male Admission Date (Current Location): 12/21/2023  [place] and [place] Number:  Ernesto Heady [id number] S Facility and Address:  [facility] [place]. [facility], [address] [place], [place] [address]      Provider Number: [id number]  Attending Physician Name and Address:  Kandis Ormond, DO  Relative Name and Phone Number:  Ardell Koller, mother - [phone]    Current Level of Care:   Recommended Level of Care: Assisted Living Facility Prior Approval Number:    Date Approved/Denied:   PASRR Number:    Discharge Plan: Other (Comment) [facility])    Current Diagnoses: Patient Active Problem List   Diagnosis Date Noted   Altered mental status 12/22/2023   Chronic health problem 12/22/2023   Elevated serum creatinine 12/22/2023   Type 2 diabetes mellitus without complication, without long-term current use of insulin  (HCC) 04/11/2023   Disorientation 04/06/2023   Multiple falls 04/04/2023   C5 cervical fracture (HCC) 04/03/2023   Belching 12/14/2022   DISH (diffuse idiopathic skeletal hyperostosis) 05/12/2022   Bilateral primary osteoarthritis of hip 08/13/2021   Statin intolerance 06/24/2021   Spinal stenosis of lumbar region 01/16/2020   Chronic lumbar radiculopathy 12/08/2019   Hyperlipidemia associated with type 2 diabetes mellitus (HCC) 12/08/2019   Vertigo 11/03/2019   Paronychia of left index finger 05/19/2019   Rosacea 10/24/2018   Loss of weight 09/09/2018   Schizoaffective disorder, depressive type (HCC) 04/08/2018   Varicose veins of bilateral lower extremities with other complications 02/03/2016   Erectile dysfunction 04/26/2015   Seizures (HCC)    Persistent atrial fibrillation (HCC)    BMI 34.0-34.9,adult    Acute bilateral low back pain without sciatica    Seborrheic dermatitis 02/10/2007    Orientation RESPIRATION  BLADDER Height & Weight     Time, Situation, Place, Self  Normal Continent Weight: 94.3 kg Height:  5' 10 (177.8 cm)  BEHAVIORAL SYMPTOMS/MOOD NEUROLOGICAL BOWEL NUTRITION STATUS      Continent Regular diet  AMBULATORY STATUS COMMUNICATION OF NEEDS Skin   Limited Assist Verbally Normal                       Personal Care Assistance Level of Assistance  Bathing, Feeding, Dressing Bathing Assistance: Limited assistance Feeding assistance: Independent Dressing Assistance: Limited assistance     Functional Limitations Info  Sight, Hearing, Speech Sight Info: Impaired Hearing Info: Adequate Speech Info: Adequate    SPECIAL CARE FACTORS FREQUENCY  PT (By licensed PT), OT (By licensed OT)     PT Frequency: 2 x per week              Contractures      Additional Factors Info  Code Status, Allergies, Psychotropic Code Status Info: Full Code Allergies Info: Lamictal , Metformin  Psychotropic Info: Cogentin , Nardil , Dapakote ER, Trilafon          Current Medications (12/22/2023):  This is the current hospital active medication list Current Facility-Administered Medications  Medication Dose Route Frequency Provider Last Rate Last Admin   acetaminophen  (TYLENOL ) tablet 650 mg  650 mg Oral Q6H PRN Gomes, Adriana, DO       Or   acetaminophen  (TYLENOL ) suppository 650 mg  650 mg Rectal Q6H PRN Gomes, Adriana, DO       allopurinol  (ZYLOPRIM ) tablet 300 mg  300 mg Oral Daily Gomes, Adriana, DO   300 mg at  12/22/23 0917   apixaban  (ELIQUIS ) tablet 5 mg  5 mg Oral BID Gomes, Adriana, DO   5 mg at 12/22/23 1610   atorvastatin  (LIPITOR) tablet 40 mg  40 mg Oral QHS Gomes, Adriana, DO       benztropine  (COGENTIN ) tablet 0.5 mg  0.5 mg Oral BID Gomes, Adriana, DO   0.5 mg at 12/22/23 0919   [START ON 12/23/2023] divalproex  (DEPAKOTE  ER) 24 hr tablet 500 mg  500 mg Oral BID Rayma Calandra, DO       levothyroxine  (SYNTHROID ) tablet 125 mcg  125 mcg Oral Daily Gomes, Adriana, DO   125  mcg at 12/22/23 0550   pantoprazole  (PROTONIX ) EC tablet 80 mg  80 mg Oral Daily Gomes, Adriana, DO   80 mg at 12/22/23 9604   perphenazine  (TRILAFON ) tablet 24 mg  24 mg Oral QHS Gomes, Adriana, DO       phenelzine  (NARDIL ) tablet 45 mg  45 mg Oral BID Gomes, Adriana, DO   45 mg at 12/22/23 5409   potassium chloride  SA (KLOR-CON  M) CR tablet 40 mEq  40 mEq Oral BID Hindel, Jim Motts, MD   40 mEq at 12/22/23 1249     Discharge Medications: Please see discharge summary for a list of discharge medications.  Relevant Imaging Results:  Relevant Lab Results:   Additional Information SS#: [id number]  Dane Dung, RN

## 2023-12-22 NOTE — Progress Notes (Signed)
    Durable Medical Equipment  (From admission, onward)           Start     Ordered   12/22/23 1438  For home use only DME Walker rolling  Once       Question Answer Comment  Walker: With 5 Inch Wheels   Patient needs a walker to treat with the following condition Acute alteration in mental status   Patient needs a walker to treat with the following condition Physical deconditioning      12/22/23 1439

## 2023-12-22 NOTE — Care Management Obs Status (Signed)
 MEDICARE OBSERVATION STATUS NOTIFICATION   Patient Details  Name: [name] MRN: [id number] Date of Birth: [birth year]   Medicare Observation Status Notification Given:  Yes Patient could not sign but understands Obs systus  Wynonia Hedges 12/22/2023, 9:08 AM

## 2023-12-22 NOTE — Assessment & Plan Note (Signed)
 A-fib: On Eliquis  twice daily and EKG revealed currently in A-fib without RVR. Will continue Eliquis  BID. Hypothyroidism: Continue home Synthroid  125 mcg daily. TSH 0.406, lower than it was 6 months prior, could consider lowering Synthroid  dose. HLD: Continue Lipitor 40 mg daily Barret's Esophagus: Continue formulary equivalent of Omeprazole  40 mg daily with Protonix  80 mg daily. Gout: Continue home Allopurinol  300 mg daily

## 2023-12-22 NOTE — Progress Notes (Signed)
 Transition of Care [facility]) - Inpatient Brief Assessment   Patient Details  Name: ASANI MCBURNEY MRN: [id number] Date of Birth: 1961-06-30  [facility] of [facility]) CM/SW Contact:    Dane Dung, RN Phone Number: 12/22/2023, 3:57 PM   Clinical Narrative: Patient admitted to the hospital for AMS changes and should return to [facility] when stable for discharge.  Patient was seen by PT and [facility] Pt/OT is recommended. I called and spoke with Arlyce Berger, CM at [facility] and she is aware that patient is admitted to the hospital.  The facility uses [facility] for [facility] services -[facility] orders for PT/OT placed.  I called [facility] and they will start services when patient returns to the facility for care.  FL2 completed and addendum will be added before patient is sent home to include changes to medications when he is discharged back to the facility.  DME at the facility includes [facility], RW - patient will not need RW since he already has one at the facility.  [facility] will possibly provide transportation to home when patient is closer to discharge if driver is available at the facility.   Transition of Care Asessment: Insurance and Status: (P) Insurance coverage has been reviewed Patient has primary care physician: (P) Yes Home environment has been reviewed: (P) from [facility] Prior level of function:: (P) WC, RW Prior/Current Home Services: (P) No current home services Social Drivers of Health Review: (P) SDOH reviewed needs interventions Readmission risk has been reviewed: (P) Yes Transition of care needs: (P) transition of care needs identified, TOC will continue to follow

## 2023-12-22 NOTE — Hospital Course (Signed)
 [name] is a 63 y.o.male with a history of seizure disorder, hypothyroidism, schizophrenia, A-fib on Eliquis  who was admitted to the [facility] Medicine Teaching Service at [facility] for AMS. His hospital course is detailed below:  AMS Seen at PCP office day prior to admission for same concern, but with acute worsening noted at his facility patient was sent to the ED for urgent evaluation for AMS. Negative workup for acute causes of AMS (electrolyte derangements, infection, and stroke) with labs, CXR, CT head w/o contrast. MRI brain w/o contrast revealed chronic, progressed brain volume loss predominantly in the frontal and temporal lobes, suspicious for frontotemporal degeneration. At time of discharge, his mental status was at baseline. Recommend outpatient evaluation with neurology.   Schizoaffective Disorder Concern for increased delusions and previous office visit and on admission.  Takes all psych meds as prescribed including Cogentin , Depakote , Triafon, and Nardil .  Psych was consulted during admission and recommended holding Depakote  dose x 2 and repeating Depakote  level which was within normal limits.  Also recommended outpatient neurology follow-up for medication adjustment of Depakote .  Other chronic conditions were medically managed with home medications and formulary alternatives as necessary (A-fib on Eliquis , hypothyroidism on Synthroid , HLD on Lipitor, Barrett's esophagus on omeprazole , gout on allopurinol )  PCP Follow-up Recommendations: Outpatient follow-up with neurology for Depakote  medication adjustment and evaluation of frontotemporal degeneration. Decreased ozempic  to 1mg  weekly avoid dehydration.  Check TSH in 4-6 weeks and adjust synthroid  as needed

## 2023-12-22 NOTE — Assessment & Plan Note (Addendum)
 A-fib: On Eliquis  twice daily and EKG revealed currently in A-fib without RVR. Will continue Eliquis  BID. Hypothyroidism: Continue home Synthroid  125 mcg daily. TSH 0.406, lower than it was 6 months prior, could consider lowering Synthroid  dose. HLD: Continue Lipitor 40 mg daily Barret's Esophagus: Continue formulary equivalent of Omeprazole  40 mg daily with Protonix  80 mg daily. Gout: Continue home Allopurinol  300 mg daily

## 2023-12-22 NOTE — Assessment & Plan Note (Addendum)
 Concern for increased delusions recently based on history.  Takes all his medications as prescribed.  - Continue home medications: Cogentin  0.5 mg BID, Depakote  500 mg BID, Triafon 24 mg at bedtime, and Nardil  45 mg BID - Consider psychiatry consult in AM

## 2023-12-22 NOTE — H&P (Addendum)
 Hospital Admission History and Physical Service Pager: [phone]  Patient name: [name] Medical record number: [id number] Date of Birth: [birth year] Age: 63 y.o. Gender: male  Primary Care Provider: Lavada Porteous, DO Consultants: None Code Status: Full Code Preferred Emergency Contact:  Contact Information     Name Relation Home Work Mobile   Pruett,Dana Mother [phone]  [phone]   Chief Complaint: AMS  Assessment and Plan: [name] is a 63 y.o. male with PMHx of hypothyroidism, schizoaffective disorder, A-fib on Eliquis , and anxiety/depression presenting with AMS.  Differential for this patient's presentation of this includes worsening dementia vs worsening schizoaffective disorder which is likely d/t some of the delusions noted while obtaining history.  Other acute causes of AMS (electrolyte derangements, infection, and stroke) were worked up  and ruled out CXR, CT head w/o contrast, and labs.  Previously admitted for altered mentation in October 2024 at which time he presented with confusion and balance issues with negative workup.  Psych was consulted and titrated his medications down to help improve his mentation.if remainder of workup continues to be negative, will consult psychiatry for their input. Assessment & Plan Altered mental status With history and negative workup so far, AMS likely secondary to worsening dementia or schizoaffective disorder.  - Admit to FMTS, attending Dr. Alverna John - Med-Surg, Vital signs per floor - Regular diet  - PT/OT to treat - VTE prophylaxis: Eliquis  - MRI brain w/o contrast - Labs: Ammonia, RPR, UA, UDS, Thiamine, Vit B12 pending, and Troponins pending - Fall precautions - Delirium precautions - If labs and imaging are negative, consider psych consult Schizoaffective disorder, depressive type (HCC) Concern for increased delusions recently based on history.  Takes all his medications as prescribed.  - Continue home  medications: Cogentin  0.5 mg BID, Depakote  500 mg BID, Triafon 24 mg at bedtime, and Nardil  45 mg BID - Consider psychiatry consult in AM Elevated serum creatinine Unclear if this is an AKI or his baseline has risen, previously 0.6-0.8, but Cr 2 months ago 1.04.  Appeared dehydrated on exam, will resuscitate with fluids. - LR bolus - Monitor with AM BMP Chronic health problem A-fib: On Eliquis  twice daily and EKG revealed currently in A-fib without RVR. Will continue Eliquis  BID. Hypothyroidism: Continue home Synthroid  125 mcg daily. TSH 0.406, lower than it was 6 months prior, could consider lowering Synthroid  dose. HLD: Continue Lipitor 40 mg daily Barret's Esophagus: Continue formulary equivalent of Omeprazole  40 mg daily with Protonix  80 mg daily. Gout: Continue home Allopurinol  300 mg daily  FEN/GI: Regular Diet VTE Prophylaxis: Eliquis  BID  Disposition: Med-Surg  History of Present Illness:  [name] is a 63 y.o. male presenting with altered mental status.  Was seen at [facility] today for confusion that has been present for 3 days and shortness of breath.  Blood work was collected in clinic,  which was within normal limits for electrolyte derangements and blood count, however valproic acid  was slightly elevated., but appears to be within therapeutic range for schizoaffective disorder.  Acute worsening of his confusion noted at [facility] this evening, he was unable to recall what was discussed at the [facility] office, so they sent him for urgent evaluation.  Denies any falls or head trauma.  Per [facility] visit, concern for possible psychiatric cause of his confusion given thoughts of everything replicated in his room or medication side effects after losing about 18 pounds in a month. Semaglutide  was decreased to 1 mg weekly and hydration was  recommended.  Upon exam in the ED, patient was stating that he felt like he was in someone else's apartment and someone was living in my apartment and stole my  things.  Patient is alert and oriented to name, birthday, and month/year.  Could recall location after we shared that he was at [facility], since he initially believed he was still at his assisted living facility.  Able to answer all questions and follow all commands.  Denies any fevers, chills, headaches, nausea, vomiting, abdominal pain, chest pain, shortness of breath.  In the ED, VSS.  Labs demonstrated no leukocytosis, Hgb wnl with mild thrombocytopenia (previously noted as well), electrolytes wnl, valproic acid  level 112 (at therapeutic level), normal TSH with elevated T4 of 1.43 (likely since he is on Synthroid ).  CXR with mild right basilar atelectasis.  CT head without contrast demonstrated no acute intracranial abnormality and global cortical atrophy with small vessel ischemic changes noted. UA pending.    Review Of Systems: Per HPI  Pertinent Past Medical History: A-fib Anxiety Depression Hypothyroidism Seizures Schizophrenia Remainder reviewed in history tab.   Pertinent Past Surgical History: ORIF radial fracture Cataract extraction bilaterally Anterior cervical decompression fusion Remainder reviewed in history tab.   Pertinent Social History: Tobacco use: No Alcohol use: None Other Substance use: No Lives at [facility]  Pertinent Family History: Mother: Alive; OCD, Barrett's esophagus Father: Deceased; diabetes, heart disease, esophageal cancer Remainder reviewed in history tab.   Important Outpatient Medications: Allopurinol  300 mg daily Eliquis  5 mg twice daily Lipitor 40 mg daily Cogentin  0.5 mg twice daily Depakote  500 mg twice daily Synthroid  125 mcg daily Omeprazole  40 mg daily Perphenazine  24 mg at bedtime Phenelzine  45 mg twice daily MiraLAX  17 g daily Semaglutide  1 mg per dose weekly on Wednesdays Senna-Docusate 8.6-50 mg at bedtime Remainder reviewed in medication history.   Objective: BP (!) 114/95   Pulse 78   Temp 97.9 F  (36.6 C) (Oral)   Resp 16   SpO2 93%  Exam: General: Awake in NAD HEENT: Facial peeling and dryness noted. Dry mouth. NCAT. Sclera anicteric. No rhinorrhea. Cardiovascular: RRR. No M/R/G Respiratory: CTAB, normal WOB on RA. No wheezing, crackles, rhonchi, or diminished breath sounds. Abdomen: Soft, non-tender, non-distended. Bowel sounds normoactive Extremities: Able to move all extremities. No BLE edema, no deformities or significant joint findings. Skin: Warm and dry. No abrasions or rashes noted. Neuro: A&Ox2 (name, month/year, birthday). Tremor at baseline in b/l hands.  CN II: PERRL CN III, IV,VI: EOMI CV V: Normal sensation in V1, V2, V3 CVII: Symmetric smile and brow raise CN VIII: Normal hearing CN IX,X: Symmetric palate raise  CN XI: 5/5 shoulder shrug CN XII: Symmetric tongue protrusion  UE and LE strength 5/5 Normal sensation in UE and LE bilaterally  No ataxia with finger to nose   Labs:  CBC BMET  Recent Labs  Lab 12/21/23 2212  WBC 8.1  HGB 13.8  HCT 43.0  PLT 141*   Recent Labs  Lab 12/21/23 2212  NA 137  K 4.1  CL 100  CO2 25  BUN 19  CREATININE 1.16  GLUCOSE 104*  CALCIUM  8.9    TSH: 0.406 T4: 1.43 Valproic acid : 112  EKG: My own interpretation: Appears to be in A-fib, heart rate 68.  No ST elevation noted.  Imaging Studies Performed:  CXR Mild right basilar atelectasis.  CT Head w/o contrast 1. No acute intracranial abnormality. 2. Global cortical atrophy with small vessel ischemic changes.  Clyda Dark, DO 12/22/2023, 2:47 AM PGY-1, [facility]  FPTS Intern pager: [phone], text pages welcome  Upper Level Addendum: I have seen and evaluated this patient along with Dr. Randeen Busman and reviewed the above note, making necessary revisions as appropriate. I agree with the medical decision making and physical exam as noted above. Lavada Porteous, DO PGY-2 [facility] Residency  Secure chat group [facility] Teaching Service

## 2023-12-22 NOTE — Assessment & Plan Note (Addendum)
 With history and negative workup so far, AMS likely secondary to worsening dementia or schizoaffective disorder.  - Admit to FMTS, attending Dr. Alverna John - Med-Surg, Vital signs per floor - Regular diet  - PT/OT to treat - VTE prophylaxis: Eliquis  - MRI brain w/o contrast - Labs: Ammonia, RPR, UA, UDS, Thiamine, Vit B12 pending, and Troponins pending - Fall precautions - Delirium precautions - If labs and imaging are negative, consider psych consult

## 2023-12-22 NOTE — Evaluation (Addendum)
 Occupational Therapy Evaluation Patient Details Name: [name] MRN: [id number] DOB: 28-Nov-1960 Today's Date: 12/22/2023   History of Present Illness   63 year old male patient admitted 6/17 with AMS. PMH: schizoaffective disorder, dementia, A-fib, hypothyroidism, and gout     Clinical Impressions Pt reports using RW/cane at baseline for functional mobility, has assist from staff for ADLs. Lives in ALF. Pt currently needing up to mod A for ADLs, and min A for transfers with RW. Pt noted to have BUE tremors, states this is baseline for him but notable self feeding and fine motor impairments due to tremors. Pt presenting with impairments listed below, will follow acutely. Recommend HHOT at ALF upon d/c.      If plan is discharge home, recommend the following:   A little help with walking and/or transfers;A lot of help with bathing/dressing/bathroom;Assistance with cooking/housework;Direct supervision/assist for medications management;Assist for transportation;Direct supervision/assist for financial management;Help with stairs or ramp for entrance     Functional Status Assessment   Patient has had a recent decline in their functional status and demonstrates the ability to make significant improvements in function in a reasonable and predictable amount of time.     Equipment Recommendations   Other (comment);BSC/3in1 (RW)     Recommendations for Other Services   PT consult     Precautions/Restrictions   Restrictions Weight Bearing Restrictions Per Provider Order: No     Mobility Bed Mobility                    Transfers Overall transfer level: Needs assistance Equipment used: Rolling walker (2 wheels) Transfers: Sit to/from Stand Sit to Stand: Min assist, From elevated surface                  Balance Overall balance assessment: Needs assistance Sitting-balance support: No upper extremity supported, Feet supported Sitting balance-Leahy  Scale: Fair     Standing balance support: Bilateral upper extremity supported, During functional activity, Reliant on assistive device for balance Standing balance-Leahy Scale: Poor Standing balance comment: pt needs cues to leave RW on the ground                           ADL either performed or assessed with clinical judgement   ADL Overall ADL's : Needs assistance/impaired Eating/Feeding: Minimal assistance;Sitting   Grooming: Minimal assistance;Standing   Upper Body Bathing: Sitting;Minimal assistance   Lower Body Bathing: Moderate assistance;Sitting/lateral leans   Upper Body Dressing : Minimal assistance;Sitting   Lower Body Dressing: Moderate assistance;Sitting/lateral leans   Toilet Transfer: Contact guard assist;Ambulation;Rolling walker (2 wheels)           Functional mobility during ADLs: Contact guard assist       Vision   Vision Assessment?: No apparent visual deficits     Perception Perception: Not tested       Praxis Praxis: Not tested       Pertinent Vitals/Pain Pain Assessment Pain Assessment: No/denies pain     Extremity/Trunk Assessment Upper Extremity Assessment Upper Extremity Assessment: Generalized weakness (bil tremors)   Lower Extremity Assessment Lower Extremity Assessment: Defer to PT evaluation   Cervical / Trunk Assessment Cervical / Trunk Assessment: Normal   Communication Communication Communication: Impaired Factors Affecting Communication: Reduced clarity of speech   Cognition Arousal: Alert Behavior During Therapy: Flat affect Cognition: Cognition impaired   Orientation impairments: Situation Awareness: Online awareness impaired Memory impairment (select all impairments): Short-term memory Attention impairment (select first level  of impairment): Sustained attention                     Following commands: Intact       Cueing  General Comments   Cueing Techniques: Verbal cues;Tactile  cues  VSS   Exercises     Shoulder Instructions      Home Living Family/patient expects to be discharged to:: Assisted living                             Home Equipment: Jeananne Mighty - single point;Wheelchair - manual;BSC/3in1;Grab bars - toilet;Grab bars - tub/shower          Prior Functioning/Environment Prior Level of Function : Needs assist             Mobility Comments: Pt states he used device intermittently, He said staff walked with him if he walked and that he used wheelchair the rest of the time. ADLs Comments: States he had assist bathing by staff    OT Problem List: Decreased strength;Decreased range of motion;Decreased activity tolerance;Impaired balance (sitting and/or standing);Decreased cognition;Decreased safety awareness;Cardiopulmonary status limiting activity   OT Treatment/Interventions: Self-care/ADL training;Therapeutic exercise;Energy conservation;DME and/or AE instruction;Therapeutic activities;Patient/family education;Balance training      OT Goals(Current goals can be found in the care plan section)   Acute Rehab OT Goals Patient Stated Goal: none stated OT Goal Formulation: With patient Time For Goal Achievement: 01/05/24 Potential to Achieve Goals: Good ADL Goals Pt Will Perform Upper Body Dressing: with supervision;sitting Pt Will Perform Lower Body Dressing: with min assist;sitting/lateral leans;sit to/from stand Pt Will Transfer to Toilet: with supervision;ambulating;regular height toilet Pt/caregiver will Perform Home Exercise Program: Both right and left upper extremity;Increased ROM;Increased strength;With written HEP provided   OT Frequency:  Min 1X/week    Co-evaluation              AM-PAC OT 6 Clicks Daily Activity     Outcome Measure Help from another person eating meals?: A Little Help from another person taking care of personal grooming?: A Little Help from another person toileting, which includes using  toliet, bedpan, or urinal?: A Little Help from another person bathing (including washing, rinsing, drying)?: A Lot Help from another person to put on and taking off regular upper body clothing?: A Little Help from another person to put on and taking off regular lower body clothing?: A Lot 6 Click Score: 16   End of Session Equipment Utilized During Treatment: Gait belt;Rolling walker (2 wheels) Nurse Communication: Mobility status  Activity Tolerance: Patient tolerated treatment well Patient left: in chair;with call bell/phone within reach;with chair alarm set  OT Visit Diagnosis: Unsteadiness on feet (R26.81);Other abnormalities of gait and mobility (R26.89);Muscle weakness (generalized) (M62.81)                Time: 1610-9604 OT Time Calculation (min): 18 min Charges:  OT General Charges $OT Visit: 1 Visit OT Evaluation $OT Eval Low Complexity: 1 Low  Chevie Birkhead K, OTD, OTR/L SecureChat Preferred Acute Rehab [phone]   Alexine Pilant K Koonce 12/22/2023, 3:12 PM

## 2023-12-22 NOTE — Consult Note (Addendum)
  [name]   Psychiatry Consult Note - Medication Review Reason for Consult: Pharmacist-initiated review due to concern for concurrent use of MAOI (Nardil ) and antipsychotic (Trilafon ).  Psychiatry Consultant: Diantha Fossa, DNP,PMHNP-BC,FNP-BC Date: 12/22/2023  Clinical Summary: Psychiatry was consulted by pharmacy for medication review. Patient is a 63 year old male with a psychiatric history of schizoaffective disorder, anxiety/depression, and longstanding treatment with Nardil  and Trilafon . He also has medical comorbidities including hypothyroidism and atrial fibrillation (on Eliquis ). The patient is currently presenting with altered mental status (AMS). Differential diagnosis includes progression of underlying neurocognitive disorder versus exacerbation of schizoaffective disorder. Chart review indicates that other causes of AMS--including infection, stroke, and electrolyte disturbances--have been ruled out via labs, CXR, and CT head. UA remains pending.  Patient is prescribed:  Phenelzine  (Nardil ) 45 mg PO BID (MAOI)  Perphenazine  (Trilafon ) 24 mg PO daily (typical antipsychotic)  Divalproex  (Depakote ) 500 mg PO BID (Depakote  level 112, not confirmed as trough)  Benztropine  (Cogentin ) 1 mg PO BID  According to chart documentation, patient has been managed by an outpatient psychiatrist with good adherence and response. His most recent visit was on 12/02/2023, and medications were reportedly working well. 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  This is the second to third occurrence of an elevated or slightly abnormal Depakote  (valproic acid ) level, with the most recent level of 112 mcg/mL possibly not obtained as a true trough. Patient is prescribed Depakote  for seizure management, not for behavioral stabilization or  schizoaffective disorder. He is currently exhibiting tremors, which may represent a dose-related side effect. Given the clinical picture and accumulation concerns, we recommend reducing the dose in an outpatient setting. Continue to monitor for worsening tremor, sedation, or signs of seizure breakthrough. He has also seen neurology in 12/24 for tremors. Decision made to continue Depakote  at that time.   Assessment and Plan - Medication Focused Only: *Recommend obtaining UA.  *Recheck Depakote  level in the morning (order placed).  Hold 2 doses of Depakote .  *Will recommend follow up with outpatient neurology for medication adjustment of Depakote .  *Continue routine lab monitoring (CBC and LFTs (WNL) , ammonia (13) as indicated).  The current medication regimen includes an MAOI (Nardil ) and antipsychotic (Trilafon ). While this combination is uncommon and carries theoretical risk of hypertensive crisis or serotonin syndrome, there is no direct contraindication between Nardil  and typical antipsychotics such as Trilafon .  Given the patient's stable long-term use, documented treatment resistance, and current lack of acute psychiatric or safety concerns, we do not recommend abrupt changes to his psychiatric medications while inpatient.  Discontinuing or tapering chronic MAOI therapy--particularly in a treatment-resistant patient--can pose serious risks, including relapse, withdrawal symptoms, or even suicidality.  Recommend continued monitoring during hospitalization and coordination with the patient's outpatient psychiatrist.  Patient should be advised to follow up with his routine [facility] provider after discharge for ongoing medication monitoring and any future adjustments.  Psychiatry did not evaluate the patient in person at this time, as the consult was for medication review only, and there are no acute psychiatric concerns requiring inpatient intervention.

## 2023-12-22 NOTE — ED Notes (Signed)
 Patient transported to MRI

## 2023-12-22 NOTE — Assessment & Plan Note (Signed)
 Concern for increased delusions recently based on history.  Takes all his medications as prescribed.  - Continue home medications: Cogentin  0.5 mg BID, Depakote  500 mg BID, Triafon 24 mg at bedtime, and Nardil  45 mg BID - Consider psychiatry consult in AM

## 2023-12-22 NOTE — Assessment & Plan Note (Addendum)
Resolved.   - CTM

## 2023-12-22 NOTE — Evaluation (Signed)
 Physical Therapy Evaluation Patient Details Name: [name] MRN: [id number] DOB: 06/23/1961 Today's Date: 12/22/2023  History of Present Illness  63 year old male patient admitted 6/17 with AMS. PMH: schizoaffective disorder, dementia, A-fib, hypothyroidism, and gout  Clinical Impression  Pt admitted with above diagnosis. Pt was able to ambulate with RW with min assist and cues for safety. Pt with cautious gait and stated that he had assist with ambulation at facility vs. Using wheelchair with Modif I.  Most likely will be able to return to A living at d/c.  Pt currently with functional limitations due to the deficits listed below (see PT Problem List). Pt will benefit from acute skilled PT to increase their independence and safety with mobility to allow discharge.           If plan is discharge home, recommend the following: A little help with walking and/or transfers;A little help with bathing/dressing/bathroom;Assistance with cooking/housework;Assist for transportation;Help with stairs or ramp for entrance;Supervision due to cognitive status   Can travel by private vehicle        Equipment Recommendations Rolling walker (2 wheels)  Recommendations for Other Services       Functional Status Assessment Patient has had a recent decline in their functional status and demonstrates the ability to make significant improvements in function in a reasonable and predictable amount of time.     Precautions / Restrictions Precautions Precautions: Fall Restrictions Weight Bearing Restrictions Per Provider Order: No      Mobility  Bed Mobility Overal bed mobility: Needs Assistance Bed Mobility: Supine to Sit     Supine to sit: Min assist     General bed mobility comments: incr time and assist for trunk with pt with tremors he states were premorbid    Transfers Overall transfer level: Needs assistance Equipment used: Rolling walker (2 wheels) Transfers: Sit to/from Stand Sit  to Stand: Min assist, From elevated surface           General transfer comment: Pt needed steadying assist once he powered up.  Cues for hand placement.    Ambulation/Gait Ambulation/Gait assistance: Min assist Gait Distance (Feet): 20 Feet Assistive device: Rolling walker (2 wheels) Gait Pattern/deviations: Step-through pattern, Decreased stride length, Drifts right/left   Gait velocity interpretation: <1.31 ft/sec, indicative of household ambulator   General Gait Details: Pt with short steps and difficult for pt to lengthen step length even with commands. Pt not grossly unsteady however appears to be fearful of ambulation and has a cautious gait.  Cues for sequencing steps and RW.  Stairs            Wheelchair Mobility     Tilt Bed    Modified Rankin (Stroke Patients Only)       Balance Overall balance assessment: Needs assistance Sitting-balance support: No upper extremity supported, Feet supported Sitting balance-Leahy Scale: Fair     Standing balance support: Bilateral upper extremity supported, During functional activity, Reliant on assistive device for balance Standing balance-Leahy Scale: Poor Standing balance comment: relies on device for balance                             Pertinent Vitals/Pain Pain Assessment Pain Assessment: No/denies pain    Home Living Family/patient expects to be discharged to:: Assisted living                 Home Equipment: Jeananne Mighty - single point;Wheelchair - manual;BSC/3in1;Grab bars - toilet;Grab bars - tub/shower  Prior Function Prior Level of Function : Needs assist             Mobility Comments: Pt states he used device intermittently, He said staff walked with him if he walked and that he used wheelchair the rest of the time. ADLs Comments: States he had assist bathing by staff     Extremity/Trunk Assessment   Upper Extremity Assessment Upper Extremity Assessment: Defer to OT  evaluation    Lower Extremity Assessment Lower Extremity Assessment: Generalized weakness    Cervical / Trunk Assessment Cervical / Trunk Assessment: Normal  Communication   Communication Communication: Impaired Factors Affecting Communication: Reduced clarity of speech    Cognition Arousal: Alert Behavior During Therapy: Flat affect   PT - Cognitive impairments: Awareness, Memory, Problem solving, Safety/Judgement, Sequencing                         Following commands: Intact       Cueing Cueing Techniques: Verbal cues, Tactile cues     General Comments General comments (skin integrity, edema, etc.): VSS    Exercises     Assessment/Plan    PT Assessment Patient needs continued PT services  PT Problem List Decreased activity tolerance;Decreased balance;Decreased mobility;Decreased knowledge of use of DME;Decreased safety awareness       PT Treatment Interventions DME instruction;Gait training;Functional mobility training;Therapeutic activities;Therapeutic exercise;Balance training;Patient/family education;Wheelchair mobility training    PT Goals (Current goals can be found in the Care Plan section)  Acute Rehab PT Goals Patient Stated Goal: to go back to A living PT Goal Formulation: With patient Time For Goal Achievement: 01/05/24 Potential to Achieve Goals: Good    Frequency Min 2X/week     Co-evaluation               AM-PAC PT 6 Clicks Mobility  Outcome Measure Help needed turning from your back to your side while in a flat bed without using bedrails?: None Help needed moving from lying on your back to sitting on the side of a flat bed without using bedrails?: A Little Help needed moving to and from a bed to a chair (including a wheelchair)?: A Little Help needed standing up from a chair using your arms (e.g., wheelchair or bedside chair)?: A Little Help needed to walk in hospital room?: A Little Help needed climbing 3-5 steps with a  railing? : A Lot 6 Click Score: 18    End of Session Equipment Utilized During Treatment: Gait belt Activity Tolerance: Patient limited by fatigue Patient left: in chair;with call bell/phone within reach;with chair alarm set Nurse Communication: Mobility status PT Visit Diagnosis: Unsteadiness on feet (R26.81);Muscle weakness (generalized) (M62.81)    Time: 1610-9604 PT Time Calculation (min) (ACUTE ONLY): 20 min   Charges:   PT Evaluation $PT Eval Moderate Complexity: 1 Mod   PT General Charges $$ ACUTE PT VISIT: 1 Visit         [name],PT Acute Rehab Services [phone]   Florencia Hunter 12/22/2023, 2:07 PM

## 2023-12-22 NOTE — Assessment & Plan Note (Addendum)
 Unclear if this is an AKI or his baseline has risen, previously 0.6-0.8, but Cr 2 months ago 1.04.  Appeared dehydrated on exam, will resuscitate with fluids. - LR bolus - Monitor with AM BMP

## 2023-12-22 NOTE — Progress Notes (Addendum)
     Daily Progress Note Intern Pager: [phone]  Patient name: [name] Medical record number: [id number] Date of birth: 12/30/1960 Age: 63 y.o. Gender: male  Primary Care Provider: Lavada Porteous, DO Consultants: Psychiatry Code Status: Full  Pt Overview and Major Events to Date:  6/18: admitted  Assessment and Plan:  This is a 63 year old male patient with a past medical history of schizoaffective disorder, dementia, A-fib, hypothyroidism, and gout presenting with altered mental status. Assessment & Plan Altered mental status With history and negative workup so far, AMS likely secondary to worsening dementia or schizoaffective disorder.  Appears to be at baseline today, also spoke with his mother who reports that she did not think he altered, just intermittently more anxious/paranoid. Consulted psychiatry who did not recommend any changes to his current medications.  Could consider holding a dose of Depakote  to obtain true trough.  Otherwise if still at baseline would recommend outpatient neurology follow-up. - Regular diet  - PT/OT to treat - Fall precautions - Delirium precautions - If mental status worsens tomorrow consider neurology consult for neuro degeneration. Schizoaffective disorder, depressive type (HCC) Concern for increased delusions recently based on history.  Takes all his medications as prescribed.  - Continue home medications: Cogentin  0.5 mg BID, Depakote  500 mg BID, Triafon 24 mg at bedtime, and Nardil  45 mg BID - Consider psychiatry consult in AM Elevated serum creatinine Resolved.  CTM Chronic health problem A-fib: On Eliquis  twice daily and EKG revealed currently in A-fib without RVR. Will continue Eliquis  BID. Hypothyroidism: Continue home Synthroid  125 mcg daily. TSH 0.406, lower than it was 6 months prior, could consider lowering Synthroid  dose. HLD: Continue Lipitor 40 mg daily Barret's Esophagus: Continue formulary equivalent of Omeprazole  40 mg  daily with Protonix  80 mg daily. Gout: Continue home Allopurinol  300 mg daily  FEN/GI: Regular PPx: Eliquis  Dispo:Pending PT recommendations  tomorrow.   Subjective:  Patient is awake and alert, answering questions appropriately on exam this morning.  He denies any pain or discomfort.  Objective: Temp:  [97.8 F (36.6 C)-98.7 F (37.1 C)] 97.8 F (36.6 C) (06/18 0504) Pulse Rate:  [65-88] 65 (06/18 0504) Resp:  [16-18] 18 (06/18 0504) BP: (114-117)/(80-95) 117/82 (06/18 0504) SpO2:  [93 %-97 %] 94 % (06/18 0504) Weight:  [94.3 kg] 94.3 kg (06/18 0516) Physical Exam: General: Well-appearing elderly gentleman with baseline tremor. Cardiovascular: RRR, no M/R/G Respiratory: CTAB no increased work of breathing Abdomen: Flat, soft, nontender Extremities: 2+ peripheral pulses x 4  Laboratory: Most recent CBC Lab Results  Component Value Date   WBC 8.1 12/21/2023   HGB 13.8 12/21/2023   HCT 43.0 12/21/2023   MCV 92.1 12/21/2023   PLT 141 (L) 12/21/2023   Most recent BMP    Latest Ref Rng & Units 12/21/2023   10:12 PM  BMP  Glucose 70 - 99 mg/dL 147   BUN 8 - 23 mg/dL 19   Creatinine 8.29 - 1.24 mg/dL 5.62   Sodium 130 - 865 mmol/L 137   Potassium 3.5 - 5.1 mmol/L 4.1   Chloride 98 - 111 mmol/L 100   CO2 22 - 32 mmol/L 25   Calcium  8.9 - 10.3 mg/dL 8.9     Rayma Calandra, DO 12/22/2023, 7:12 AM  PGY-1, [facility] FPTS Intern pager: [phone], text pages welcome Secure chat group [facility] Teaching Service

## 2023-12-23 DIAGNOSIS — F251 Schizoaffective disorder, depressive type: Secondary | ICD-10-CM | POA: Diagnosis not present

## 2023-12-23 DIAGNOSIS — R4182 Altered mental status, unspecified: Secondary | ICD-10-CM | POA: Diagnosis not present

## 2023-12-23 DIAGNOSIS — R410 Disorientation, unspecified: Secondary | ICD-10-CM | POA: Diagnosis not present

## 2023-12-23 LAB — VALPROIC ACID LEVEL: Valproic Acid Lvl: 93 ug/mL (ref 50–100)

## 2023-12-23 NOTE — Assessment & Plan Note (Addendum)
 A-fib: continue Eliquis  BID. Hypothyroidism: Continue home Synthroid  125 mcg daily.  HLD: Continue Lipitor 40 mg daily Barret's Esophagus: Continue formulary equivalent of Omeprazole  40 mg daily with Protonix  80 mg daily. Gout: Continue home Allopurinol  300 mg daily

## 2023-12-23 NOTE — TOC Progression Note (Signed)
 Transition of Care [facility]) - Progression Note    Patient Details  Name: [name] MRN: [id number] Date of Birth: October 08, 1960  Transition of Care [facility]) CM/SW Contact  Elspeth Hals, LCSW Phone Number: 12/23/2023, 2:03 PM  Clinical Narrative:   CSW spoke with [facility] Nonda Bays.  [phone]. She is here at the hospital, just spoke with the pt.  He seems paranoid, talking about a train wreck, does not appear at his baseline for DC back to [facility].  Team updated.  1345: CSW received updates from medical team, psych team.  These were passed on the [facility] and she is in agreement with plan to DC back to [facility] today.  Needs FL2, DC summary emailed: [email or web address].  She will speak to their transportation about picking pt up.  MD team updated.           Expected Discharge Plan and Services                                               Social Determinants of Health (SDOH) Interventions SDOH Screenings   Food Insecurity: Patient Unable To Answer (12/22/2023)  Housing: Patient Unable To Answer (12/22/2023)  Transportation Needs: Patient Unable To Answer (12/22/2023)  Utilities: Patient Unable To Answer (12/22/2023)  Alcohol Screen: Low Risk  (02/13/2023)  Depression (PHQ2-9): Low Risk  (11/11/2023)  Financial Resource Strain: Low Risk  (02/13/2023)  Physical Activity: Inactive (02/13/2023)  Social Connections: Patient Unable To Answer (12/22/2023)  Stress: No Stress Concern Present (02/13/2023)  Tobacco Use: Low Risk  (12/21/2023)  Health Literacy: Adequate Health Literacy (02/13/2023)    Readmission Risk Interventions     No data to display

## 2023-12-23 NOTE — Progress Notes (Signed)
 Mobility Specialist: Progress Note   12/23/23 1512  Mobility  Activity Ambulated with assistance in hallway  Level of Assistance Contact guard assist, steadying assist  Assistive Device Front wheel walker  Distance Ambulated (ft) 150 ft  Activity Response Tolerated well  Mobility Referral Yes  Mobility visit 1 Mobility  Mobility Specialist Start Time (ACUTE ONLY) 1158  Mobility Specialist Stop Time (ACUTE ONLY) 1219  Mobility Specialist Time Calculation (min) (ACUTE ONLY) 21 min    Pt received in bed, agreeable to mobility session. No complaints. SV for bed mobility. ModA needed for sitting balance on EOB and STS. Pt with heavy R sided lean this session. MinA for ambulation for steadiness. Returned to room without complaints. Left in chair with all needs met, call bell in reach. Chair alarm on.   Deloria Fetch Mobility Specialist Please contact via SecureChat or Rehab office at [phone]

## 2023-12-23 NOTE — Discharge Instructions (Signed)
 Dear [name],  Thank you for letting us  participate in your care. You were hospitalized for be ing confused and diagnosed with Altered mental status. You were treated with some extra hydration and your medication regimen was discussed with your the psychiatry team.   POST-HOSPITAL & CARE INSTRUCTIONS If you become confused again please return to be seen. Please follow-up with your psychiatry team and neurology. Go to your follow up appointments (listed below)   DOCTOR'S APPOINTMENT   Future Appointments  Date Time Provider Department Center  01/03/2024  1:30 PM Lavada Porteous, [place] [facility]  02/14/2024 11:10 AM [facility] ANNUAL Brodie Cannon VISIT [facility]  02/24/2024  3:40 PM Arfeen, Bronson Canny, MD [facility] None    Follow-up Information     [facility] Follow up.   Why: [facility] will provide home health services when you return to [facility] for care.                Take care and be well!  Family Medicine Teaching Service Inpatient Team [place]  [facility]  [address] [place], [place] [address] [phone]

## 2023-12-23 NOTE — Assessment & Plan Note (Deleted)
Resolved.   - CTM

## 2023-12-23 NOTE — Assessment & Plan Note (Addendum)
 Stable. Facility is concerned about ongoing paranoia, but patient has remained calm and has not been distressed by his delusions while admitted. Will reach back out to Psych and obtain further recommendations.  - Continue home medications: Cogentin  0.5 mg BID, Depakote  500 mg BID, Triafon 24 mg at bedtime, and Nardil  45 mg BID - Consider psychiatry consult in AM

## 2023-12-23 NOTE — Assessment & Plan Note (Addendum)
 Mental status is stable. Depakote  trough after holding evening dose appropriate.  Most likely AMS secondary to dehydration in setting of increased dose of Ozempic .  Will recommend decreasing back to 1 mg dose outpatient, as well as titrating depakote  outpatient.  - Regular diet  - PT/OT to treat - Fall precautions - Delirium precautions - If mental status worsens tomorrow consider neurology consult for neuro degeneration.

## 2023-12-23 NOTE — Consult Note (Signed)
 [name] - follow up--see note at the bottom.  Psychiatry Consult Note - Medication Review Reason for Consult: Pharmacist-initiated review due to concern for concurrent use of MAOI (Nardil ) and antipsychotic (Trilafon ).  Psychiatry Consultant: Diantha Fossa, DNP,PMHNP-BC,FNP-BC Date: 12/22/2023  Clinical Summary: Psychiatry was consulted by pharmacy for medication review. Patient is a 63 year old male with a psychiatric history of schizoaffective disorder, anxiety/depression, and longstanding treatment with Nardil  and Trilafon . He also has medical comorbidities including hypothyroidism and atrial fibrillation (on Eliquis ). The patient is currently presenting with altered mental status (AMS). Differential diagnosis includes progression of underlying neurocognitive disorder versus exacerbation of schizoaffective disorder. Chart review indicates that other causes of AMS--including infection, stroke, and electrolyte disturbances--have been ruled out via labs, CXR, and CT head. UA remains pending.  Patient is prescribed:  Phenelzine  (Nardil ) 45 mg PO BID (MAOI)  Perphenazine  (Trilafon ) 24 mg PO daily (typical antipsychotic)  Divalproex  (Depakote ) 500 mg PO BID (Depakote  level 112, not confirmed as trough)  Benztropine  (Cogentin ) 1 mg PO BID  According to chart documentation, patient has been managed by an outpatient psychiatrist with good adherence and response. His most recent visit was on 12/02/2023, and medications were reportedly working well. 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  This is the second to third occurrence of an elevated or slightly abnormal Depakote  (valproic acid ) level, with the most recent level of 112 mcg/mL possibly not obtained as a true trough. Patient is prescribed Depakote  for seizure management, not for  behavioral stabilization or schizoaffective disorder. He is currently exhibiting tremors, which may represent a dose-related side effect. Given the clinical picture and accumulation concerns, we recommend reducing the dose in an outpatient setting. Continue to monitor for worsening tremor, sedation, or signs of seizure breakthrough. He has also seen neurology in 12/24 for tremors. Decision made to continue Depakote  at that time.   Assessment and Plan - Medication Focused Only: *Recommend obtaining UA.  *Recheck Depakote  level in the morning (order placed).  Hold 2 doses of Depakote .  *Will recommend follow up with outpatient neurology for medication adjustment of Depakote .  *Continue routine lab monitoring (CBC and LFTs (WNL) , ammonia (13) as indicated).  The current medication regimen includes an MAOI (Nardil ) and antipsychotic (Trilafon ). While this combination is uncommon and carries theoretical risk of hypertensive crisis or serotonin syndrome, there is no direct contraindication between Nardil  and typical antipsychotics such as Trilafon .  Given the patient's stable long-term use, documented treatment resistance, and current lack of acute psychiatric or safety concerns, we do not recommend abrupt changes to his psychiatric medications while inpatient.  Discontinuing or tapering chronic MAOI therapy--particularly in a treatment-resistant patient--can pose serious risks, including relapse, withdrawal symptoms, or even suicidality.  Recommend continued monitoring during hospitalization and coordination with the patient's outpatient psychiatrist.  Patient should be advised to follow up with his routine [facility] provider after discharge for ongoing medication monitoring and any future adjustments.  Psychiatry did not evaluate the patient in person at this time, as the consult was for medication review only, and there are no acute psychiatric concerns requiring inpatient intervention.  12/23/2023: The  client was talking to a representative from his SNF, [facility], on assessment.  He reported he felt something was interfering with his functioning before today and now It's alright, no longer feels this way.  No depression or anxiety of assessment or suicidal ideations.  Denied hallucinations, no paranoia or other concerns.  He was able to provide his psychiatrist's name, [name], and that he has been stable on his current psychiatric medications for 45 years.  His valproic acid  level was 110 and 112 on admission, today it is 93.    When this provider left the room, the representative from [facility] stopped her to discuss the client.  She stated he was social and engaged in activities until recently when he started staying in bed and sleeping frequently.  The facility took him to his PCP and his regular doctor was not there and the substitute lowered his Ozempic .  He was previously on Trulicity  and she noticed the change when the Ozempic  was started.  She reported prior to admission, he was putting on two pairs of underwear, confused, and paranoid.  Also said he was looking towards the door at times when she was visiting like he was paranoid along with saying staff at the hospital kick at him.  The MD at [facility] discontinued the Ozempic  but his last weekly dose was on Tuesday.  Confounding these findings is his frontotemporal dementia.  Hopefully, his psychiatric symptoms will continue to improve.  At this time, he can be followed up outpatient as he is not warranting an admission.  Pleasant and cooperative on assessment, no issues engaging in conversation.

## 2023-12-23 NOTE — Plan of Care (Signed)
  Problem: Nutrition: Goal: Adequate nutrition will be maintained Outcome: Progressing   Problem: Coping: Goal: Level of anxiety will decrease Outcome: Progressing   Problem: Pain Managment: Goal: General experience of comfort will improve and/or be controlled Outcome: Progressing   Problem: Safety: Goal: Ability to remain free from injury will improve Outcome: Progressing

## 2023-12-23 NOTE — Discharge Summary (Signed)
 Family Medicine Teaching [facility] Discharge Summary  Patient name: [name] Medical record number: [id number] Date of birth: [birth year] Age: 64 y.o. Gender: male Date of Admission: 12/21/2023  Date of Discharge: 12-23-2023 Admitting Physician: Clyda Dark, DO  Primary Care Provider: Lavada Porteous, DO Consultants: Psychiatry  Indication for Hospitalization: Altered mental status  Discharge Diagnoses/Problem List:  Principal Problem for Admission: Altered mental status Other Problems addressed during stay:  Principal Problem:   Altered mental status Active Problems:   Schizoaffective disorder, depressive type [facility])   Chronic health problem    Brief Hospital Course:  [name] is a 63 y.o.male with a history of seizure disorder, hypothyroidism, schizophrenia, A-fib on Eliquis  who was admitted to the [facility] Medicine Teaching Service at [facility] for AMS. His hospital course is detailed below:  AMS Seen at PCP office day prior to admission for same concern, but with acute worsening noted at his facility patient was sent to the ED for urgent evaluation for AMS. Negative workup for acute causes of AMS (electrolyte derangements, infection, and stroke) with labs, CXR, CT head w/o contrast. MRI brain w/o contrast revealed chronic, progressed brain volume loss predominantly in the frontal and temporal lobes, suspicious for frontotemporal degeneration. At time of discharge, his mental status was at baseline. Recommend outpatient evaluation with neurology.   Schizoaffective Disorder Concern for increased delusions and previous office visit and on admission.  Takes all psych meds as prescribed including Cogentin , Depakote , Triafon, and Nardil .  Psych was consulted during admission and recommended holding Depakote  dose x 2 and repeating Depakote  level which was within normal limits.  Also recommended outpatient neurology follow-up for medication adjustment of Depakote .  Other  chronic conditions were medically managed with home medications and formulary alternatives as necessary (A-fib on Eliquis , hypothyroidism on Synthroid , HLD on Lipitor, Barrett's esophagus on omeprazole , gout on allopurinol )  PCP Follow-up Recommendations: Outpatient follow-up with neurology for Depakote  medication adjustment and evaluation of frontotemporal degeneration. Decreased ozempic  to 1mg  weekly avoid dehydration.  Check TSH in 4-6 weeks and adjust synthroid  as needed   Disposition: facility  Discharge Condition: stable  Discharge Exam:  Vitals:   12/23/23 0426 12/23/23 0832  BP: (!) 140/94 (!) 139/119  Pulse: 77 80  Resp: 18   Temp: 97.8 F (36.6 C) 98 F (36.7 C)  SpO2: 96% 93%   Per Dr Kassie Pais exam on 6/19 AM General: Well-appearing elderly gentleman, no distress Cardiovascular: RRR, no M/R/G Respiratory: CTAB, no increased work of breathing Abdomen: Flat, soft, nontender Extremities: 2+ peripheral pulses, no evidence of peripheral edema.  Significant Procedures: none  Significant Labs and Imaging:  Recent Labs  Lab 12/21/23 2212  WBC 8.1  HGB 13.8  HCT 43.0  PLT 141*   Recent Labs  Lab 12/21/23 2212 12/22/23 0834  NA 137 137  K 4.1 3.3*  CL 100 101  CO2 25 26  GLUCOSE 104* 93  BUN 19 15  CREATININE 1.16 0.84  CALCIUM  8.9 9.0  ALKPHOS 62  --   AST 14*  --   ALT 5  --   ALBUMIN  3.6  --     No pertinent imaging  Results/Tests Pending at Time of Discharge: none  Discharge Medications:  Allergies as of 12/23/2023       Reactions   Lamictal  [lamotrigine ] Rash   Metformin  And Related Rash        Medication List     TAKE these medications    allopurinol  300 MG tablet Commonly known  as: ZYLOPRIM  TAKE 1 TABLET BY MOUTH EVERY DAY   apixaban  5 MG Tabs tablet Commonly known as: Eliquis  Take 1 tablet (5 mg total) by mouth 2 (two) times daily.   atorvastatin  40 MG tablet Commonly known as: LIPITOR TAKE 1 TABLET BY MOUTH EVERY DAY    benztropine  0.5 MG tablet Commonly known as: COGENTIN  Take 1 tablet (0.5 mg total) by mouth 2 (two) times daily.   divalproex  500 MG 24 hr tablet Commonly known as: Depakote  ER Take 1 tablet (500 mg total) by mouth 2 (two) times daily.   levothyroxine  125 MCG tablet Commonly known as: SYNTHROID  Take 1 tablet (125 mcg total) by mouth daily.   omeprazole  40 MG capsule Commonly known as: PRILOSEC Take 1 capsule (40 mg total) by mouth daily.   perphenazine  8 MG tablet Commonly known as: TRILAFON  Take 3 tablets (24 mg total) by mouth at bedtime.   phenelzine  15 MG tablet Commonly known as: Nardil  Take 3 tablets (45 mg total) by mouth 2 (two) times daily.   polyethylene glycol 17 g packet Commonly known as: MIRALAX  / GLYCOLAX  Take 34 g by mouth daily.   Semaglutide  (1 MG/DOSE) 4 MG/3ML Sopn Inject 1 mg into the skin once a week.   senna-docusate 8.6-50 MG tablet Commonly known as: Senokot-S Take 1 tablet by mouth at bedtime.               Durable Medical Equipment  (From admission, onward)           Start     Ordered   12/22/23 1438  For home use only DME Walker rolling  Once       Question Answer Comment  Walker: With 5 Inch Wheels   Patient needs a walker to treat with the following condition Acute alteration in mental status   Patient needs a walker to treat with the following condition Physical deconditioning      12/22/23 1439            Discharge Instructions: Please refer to Patient Instructions section of EMR for full details.  Patient was counseled important signs and symptoms that should prompt return to medical care, changes in medications, dietary instructions, activity restrictions, and follow up appointments.   Follow-Up Appointments:  Follow-up Information     [facility] Follow up.   Why: [facility] will provide home health services when you return to [facility] for care.        Penumalli, Brenton Cambridge, MD. Schedule an  appointment as soon as possible for a visit in 2 week(s).   Specialties: Neurology, Radiology Contact information: [address] [place] [address] [phone]                 Naida Austria, MD 12/23/2023, 2:03 PM PGY-1, [facility] Family Medicine

## 2023-12-23 NOTE — Progress Notes (Signed)
     Daily Progress Note Intern Pager: [phone]  Patient name: [name] Medical record number: [id number] Date of birth: 1961/03/17 Age: 63 y.o. Gender: male  Primary Care Provider: Lavada Porteous, DO Consultants: Psychiatry Code Status: Full  Pt Overview and Major Events to Date:  6/18: Admitted  Assessment and Plan:  This is a 63 year old male patient with past medical history of schizoaffective disorder, dementia, A-fib, hypothyroidism and gout presenting with altered mental status.  He is now at his mental baseline. Assessment & Plan Altered mental status Mental status is stable. Depakote  trough after holding evening dose appropriate.  Most likely AMS secondary to dehydration in setting of increased dose of Ozempic .  Will recommend decreasing back to 1 mg dose outpatient, as well as titrating depakote  outpatient.  - Regular diet  - PT/OT to treat - Fall precautions - Delirium precautions - If mental status worsens tomorrow consider neurology consult for neuro degeneration. Schizoaffective disorder, depressive type (HCC) Stable. Facility is concerned about ongoing paranoia, but patient has remained calm and has not been distressed by his delusions while admitted. Will reach back out to Psych and obtain further recommendations.  - Continue home medications: Cogentin  0.5 mg BID, Depakote  500 mg BID, Triafon 24 mg at bedtime, and Nardil  45 mg BID - Consider psychiatry consult in AM Chronic health problem A-fib: continue Eliquis  BID. Hypothyroidism: Continue home Synthroid  125 mcg daily.  HLD: Continue Lipitor 40 mg daily Barret's Esophagus: Continue formulary equivalent of Omeprazole  40 mg daily with Protonix  80 mg daily. Gout: Continue home Allopurinol  300 mg daily  FEN/GI: Regular PPx: Eliquis  Dispo:Return to ALF when at mental baseline.   Subjective:  Patient is awake sitting up eating breakfast on exam this morning.  He is more talkative and continues to be at his  baseline mental status.  Objective: Temp:  [97.8 F (36.6 C)-99 F (37.2 C)] 97.8 F (36.6 C) (06/19 0426) Pulse Rate:  [77-87] 77 (06/19 0426) Resp:  [18] 18 (06/19 0426) BP: (123-140)/(76-94) 140/94 (06/19 0426) SpO2:  [72 %-98 %] 96 % (06/19 0426) Physical Exam: General: Well-appearing elderly gentleman, no distress Cardiovascular: RRR, no M/R/G Respiratory: CTAB, no increased work of breathing Abdomen: Flat, soft, nontender Extremities: 2+ peripheral pulses, no evidence of peripheral edema.  Laboratory: Most recent CBC Lab Results  Component Value Date   WBC 8.1 12/21/2023   HGB 13.8 12/21/2023   HCT 43.0 12/21/2023   MCV 92.1 12/21/2023   PLT 141 (L) 12/21/2023   Most recent BMP    Latest Ref Rng & Units 12/22/2023    8:34 AM  BMP  Glucose 70 - 99 mg/dL 93   BUN 8 - 23 mg/dL 15   Creatinine 9.62 - 1.24 mg/dL 9.52   Sodium 841 - 324 mmol/L 137   Potassium 3.5 - 5.1 mmol/L 3.3   Chloride 98 - 111 mmol/L 101   CO2 22 - 32 mmol/L 26   Calcium  8.9 - 10.3 mg/dL 9.0     Rayma Calandra, DO 12/23/2023, 7:58 AM  PGY-1, [facility] FPTS Intern pager: [phone], text pages welcome Secure chat group [facility] Teaching Service

## 2023-12-23 NOTE — TOC Progression Note (Signed)
 Transition of Care [facility]) - Progression Note    Patient Details  Name: KALVIN BUSS MRN: [id number] Date of Birth: 07/01/1961  Transition [facility]) CM/SW Contact  Dane Dung, RN Phone Number: 12/23/2023, 11:06 AM  Clinical Narrative:    CM spoke with attending MD this morning and they would like to discharge the patient back to [place] today.  Sherrie, CM with [facility] was at the bedside after MD/NP came to the bedside to speak with the patient and Sherrie, [facility] states that the patient continued to exhibit stories/delusions while she was in the room.  CM states that the patient is not back to his baseline and that he would need to be more stable before coming back to the facility for care.  MD team was notified that the facility is unable to receive the patient back to the facility today based on their assessment at the bedside.        Expected Discharge Plan and Services                                               Social Determinants of Health (SDOH) Interventions SDOH Screenings   Food Insecurity: Patient Unable To Answer (12/22/2023)  Housing: Patient Unable To Answer (12/22/2023)  Transportation Needs: Patient Unable To Answer (12/22/2023)  Utilities: Patient Unable To Answer (12/22/2023)  Alcohol Screen: Low Risk  (02/13/2023)  Depression (PHQ2-9): Low Risk  (11/11/2023)  Financial Resource Strain: Low Risk  (02/13/2023)  Physical Activity: Inactive (02/13/2023)  Social Connections: Patient Unable To Answer (12/22/2023)  Stress: No Stress Concern Present (02/13/2023)  Tobacco Use: Low Risk  (12/21/2023)  Health Literacy: Adequate Health Literacy (02/13/2023)    Readmission Risk Interventions     No data to display

## 2023-12-23 NOTE — Plan of Care (Signed)

## 2023-12-24 ENCOUNTER — Telehealth: Payer: Self-pay

## 2023-12-24 DIAGNOSIS — F419 Anxiety disorder, unspecified: Secondary | ICD-10-CM | POA: Diagnosis not present

## 2023-12-24 DIAGNOSIS — Z6830 Body mass index (BMI) 30.0-30.9, adult: Secondary | ICD-10-CM | POA: Diagnosis not present

## 2023-12-24 DIAGNOSIS — Z7985 Long-term (current) use of injectable non-insulin antidiabetic drugs: Secondary | ICD-10-CM | POA: Diagnosis not present

## 2023-12-24 DIAGNOSIS — F251 Schizoaffective disorder, depressive type: Secondary | ICD-10-CM | POA: Diagnosis not present

## 2023-12-24 DIAGNOSIS — E785 Hyperlipidemia, unspecified: Secondary | ICD-10-CM | POA: Diagnosis not present

## 2023-12-24 DIAGNOSIS — G40909 Epilepsy, unspecified, not intractable, without status epilepticus: Secondary | ICD-10-CM | POA: Diagnosis not present

## 2023-12-24 DIAGNOSIS — E1169 Type 2 diabetes mellitus with other specified complication: Secondary | ICD-10-CM | POA: Diagnosis not present

## 2023-12-24 DIAGNOSIS — M48061 Spinal stenosis, lumbar region without neurogenic claudication: Secondary | ICD-10-CM | POA: Diagnosis not present

## 2023-12-24 DIAGNOSIS — I4819 Other persistent atrial fibrillation: Secondary | ICD-10-CM | POA: Diagnosis not present

## 2023-12-24 DIAGNOSIS — E039 Hypothyroidism, unspecified: Secondary | ICD-10-CM | POA: Diagnosis not present

## 2023-12-24 DIAGNOSIS — R296 Repeated falls: Secondary | ICD-10-CM | POA: Diagnosis not present

## 2023-12-24 DIAGNOSIS — Z9181 History of falling: Secondary | ICD-10-CM | POA: Diagnosis not present

## 2023-12-24 DIAGNOSIS — Z7901 Long term (current) use of anticoagulants: Secondary | ICD-10-CM | POA: Diagnosis not present

## 2023-12-24 DIAGNOSIS — F32A Depression, unspecified: Secondary | ICD-10-CM | POA: Diagnosis not present

## 2023-12-24 DIAGNOSIS — E669 Obesity, unspecified: Secondary | ICD-10-CM | POA: Diagnosis not present

## 2023-12-24 NOTE — Telephone Encounter (Signed)
 Received returned call from nursing staff at [facility]. She is asking for PRN medication for episodes of agitation.   She reports that when he arrived last night following D/C from the hospital that he was experiencing agitation.   She denies agitation today.   Will forward to PCP to review.   Preferred pharmacy is [facility] in [place].   Elsie Halo, RN

## 2023-12-24 NOTE — Telephone Encounter (Signed)
 Received VM from nurse at [facility] regarding patient.   Reports that patient has been experiencing episodes of agitation since being discharged from the hospital.   Returned call to RN. She did not answer, LVM asking her to return call.   Elsie Halo, RN

## 2023-12-27 ENCOUNTER — Telehealth: Payer: Self-pay

## 2023-12-27 NOTE — Telephone Encounter (Signed)
 Monique PT with [facility] calls nurse line requesting VO for [facility] PT as follows.   1x a week for 1 week 2x a week for 3 weeks   VO given per [facility] protocol.

## 2023-12-28 ENCOUNTER — Ambulatory Visit (INDEPENDENT_AMBULATORY_CARE_PROVIDER_SITE_OTHER): Admitting: Adult Health

## 2023-12-28 ENCOUNTER — Encounter: Payer: Self-pay | Admitting: Adult Health

## 2023-12-28 ENCOUNTER — Telehealth: Payer: Self-pay | Admitting: Adult Health

## 2023-12-28 VITALS — BP 91/68 | HR 58 | Ht 70.0 in

## 2023-12-28 DIAGNOSIS — E785 Hyperlipidemia, unspecified: Secondary | ICD-10-CM | POA: Diagnosis not present

## 2023-12-28 DIAGNOSIS — M48061 Spinal stenosis, lumbar region without neurogenic claudication: Secondary | ICD-10-CM | POA: Diagnosis not present

## 2023-12-28 DIAGNOSIS — Z6830 Body mass index (BMI) 30.0-30.9, adult: Secondary | ICD-10-CM | POA: Diagnosis not present

## 2023-12-28 DIAGNOSIS — G3109 Other frontotemporal dementia: Secondary | ICD-10-CM | POA: Diagnosis not present

## 2023-12-28 DIAGNOSIS — E669 Obesity, unspecified: Secondary | ICD-10-CM | POA: Diagnosis not present

## 2023-12-28 DIAGNOSIS — Z7901 Long term (current) use of anticoagulants: Secondary | ICD-10-CM | POA: Diagnosis not present

## 2023-12-28 DIAGNOSIS — F32A Depression, unspecified: Secondary | ICD-10-CM | POA: Diagnosis not present

## 2023-12-28 DIAGNOSIS — R269 Unspecified abnormalities of gait and mobility: Secondary | ICD-10-CM

## 2023-12-28 DIAGNOSIS — R296 Repeated falls: Secondary | ICD-10-CM | POA: Diagnosis not present

## 2023-12-28 DIAGNOSIS — I4819 Other persistent atrial fibrillation: Secondary | ICD-10-CM | POA: Diagnosis not present

## 2023-12-28 DIAGNOSIS — F0280 Dementia in other diseases classified elsewhere without behavioral disturbance: Secondary | ICD-10-CM | POA: Diagnosis not present

## 2023-12-28 DIAGNOSIS — R569 Unspecified convulsions: Secondary | ICD-10-CM

## 2023-12-28 DIAGNOSIS — R251 Tremor, unspecified: Secondary | ICD-10-CM

## 2023-12-28 DIAGNOSIS — F419 Anxiety disorder, unspecified: Secondary | ICD-10-CM | POA: Diagnosis not present

## 2023-12-28 DIAGNOSIS — F251 Schizoaffective disorder, depressive type: Secondary | ICD-10-CM | POA: Diagnosis not present

## 2023-12-28 DIAGNOSIS — G2119 Other drug induced secondary parkinsonism: Secondary | ICD-10-CM

## 2023-12-28 DIAGNOSIS — Z7985 Long-term (current) use of injectable non-insulin antidiabetic drugs: Secondary | ICD-10-CM | POA: Diagnosis not present

## 2023-12-28 DIAGNOSIS — E1169 Type 2 diabetes mellitus with other specified complication: Secondary | ICD-10-CM | POA: Diagnosis not present

## 2023-12-28 DIAGNOSIS — G40909 Epilepsy, unspecified, not intractable, without status epilepticus: Secondary | ICD-10-CM | POA: Diagnosis not present

## 2023-12-28 DIAGNOSIS — Z9181 History of falling: Secondary | ICD-10-CM | POA: Diagnosis not present

## 2023-12-28 DIAGNOSIS — E039 Hypothyroidism, unspecified: Secondary | ICD-10-CM | POA: Diagnosis not present

## 2023-12-28 MED ORDER — DIVALPROEX SODIUM ER 250 MG PO TB24: ORAL_TABLET | ORAL | 11 refills | Status: DC

## 2023-12-28 NOTE — Patient Instructions (Addendum)
 Your Plan:  Decrease Depakote  dose - decrease morning dose to 250mg  and continue bedtime dose at 500mg   New order will be placed to complete a [employer]   Referral will be placed to a neuropsychologist for neurocognitive testing   Increase water intake to at least 64 oz of water per day     Follow up in 4-6 months with Dr. Margaret or call earlier if needed     Thank you for coming to see us  at [facility]. I hope we have been able to provide you high quality care today.  You may receive a patient satisfaction survey over the next few weeks. We would appreciate your feedback and comments so that we may continue to improve ourselves and the health of our patients.

## 2023-12-28 NOTE — Telephone Encounter (Signed)
 Returned call to nurse, left HIPAA compliant VM asking that she return call to office.   Chiquita JAYSON English, RN

## 2023-12-28 NOTE — Telephone Encounter (Signed)
 [facility] Phone: [phone], Fax: [phone]

## 2023-12-28 NOTE — Progress Notes (Addendum)
 [facility]  PATIENT: [name] DOB: December 27, 1960  REFERRING CLINICIAN: Howell Lunger, DO HISTORY FROM: patient, ED notes, prior OV notes REASON FOR VISIT: follow up   HISTORICAL  CHIEF COMPLAINT:  Chief Complaint  Patient presents with   Tremors    Rm 3 with facility staff  Pt is well, reports he is still having significant tremors in his hands. His legs also tremor and gets weak after standing for a long period     HISTORY OF PRESENT ILLNESS:   Update 12/28/2023 JM: Patient returns for follow-up visit accompanied by ALF transporter, Heron.  He continues to reside at [facility].  He reports continued tremors and gait difficulty. Denies worsening of tremors but does interfere with activities. He came to office with w/c (transporter was not with him initially), he was not sitting in w/c when CMA called for him, he stood up without assistance or AD, stated w/c was not needed,  while walking towards exam room, he fell into the wall and CMA was able to stabilize him prior to actually falling, reported feeling dizzy and was able to assist him into w/c. He is currently working with PT. Feels dizziness on occasion. BP and HR low today, BP 91/68 and HR 58, does admit to poor water intake.  Continues on Depakote  500 mg twice daily, denies any seizures.   Recent hospitalization for AMS, was seen by psych, Ddx includes progression of underlying neurocognitive disorder vs exacerbation of schizoaffective disorder.  Depakote  level 112 although possibly not obtained as a true trough, held 2 doses with repeat level 93.  Continued Depakote  500 mg twice daily and recommended consideration of decreased dosage outpatient due to tremors and possibly dose-related side effects.  MRI brain showed chronic progressive brain volume loss predominantly in the frontal and temporal lobes, suspicious for frontotemporal degeneration.  Continued psych meds including Cogentin , Triafon and Nardil   and advised continued outpatient follow-up.  He was evaluated by therapies, noted ambulating with RW, recommended therapies at facility. HH orders placed.     UPDATE (06/14/23, VRP): Since last visit, here for evaluation of tremor. Tremors started ~3 years ago or longer. Mainly resting and postural tremor. No issues with seizures since age 63 years old. Stable on depakote  500mg  twice a day. Now living at [facility] assisted living since last hospital admission for fall, confusion, c-spine fracture.   PRIOR HPI (03/05/22, VRP): 63 year old male here for evaluation of seizure disorder.  History of depression, OCD, schizoaffective disorder.  Around age 25 years old patient had some staring spells, saw neurologist and was diagnosed with temporal lobe seizures.  Started on Depakote  which seemed to help.  He only had 1 or 2 more events around age 27 years old.  He has done well on Depakote  for many years.  March 2023 patient had an episode of confusion, crying spells, concern for possible temporal lobe seizure.  He was referred to neurology at that time.  In retrospect patient does not think this was a seizure event but rather a depression episode.  Patient also having some issues with memory, focus, attention.  These were also evaluated in 2015, and at that time felt to be related to underlying mental health diagnoses and stress factors.  Patient lives alone.  He is independent of all ADLs.   REVIEW OF SYSTEMS: Full 14 system review of systems performed and negative with exception of: as per HPI.  ALLERGIES: Allergies  Allergen Reactions   Lamictal  [Lamotrigine ] Rash  Metformin  And Related Rash    HOME MEDICATIONS: Outpatient Medications Prior to Visit  Medication Sig Dispense Refill   allopurinol  (ZYLOPRIM ) 300 MG tablet TAKE 1 TABLET BY MOUTH EVERY DAY 90 tablet 1   apixaban  (ELIQUIS ) 5 MG TABS tablet Take 1 tablet (5 mg total) by mouth 2 (two) times daily. 180 tablet 2   atorvastatin   (LIPITOR) 40 MG tablet TAKE 1 TABLET BY MOUTH EVERY DAY 90 tablet 1   benztropine  (COGENTIN ) 0.5 MG tablet Take 1 tablet (0.5 mg total) by mouth 2 (two) times daily. 60 tablet 2   levothyroxine  (SYNTHROID ) 125 MCG tablet Take 1 tablet (125 mcg total) by mouth daily.     omeprazole  (PRILOSEC) 40 MG capsule Take 1 capsule (40 mg total) by mouth daily. 30 capsule 3   perphenazine  (TRILAFON ) 8 MG tablet Take 3 tablets (24 mg total) by mouth at bedtime. 90 tablet 2   phenelzine  (NARDIL ) 15 MG tablet Take 3 tablets (45 mg total) by mouth 2 (two) times daily. 180 tablet 2   polyethylene glycol (MIRALAX  / GLYCOLAX ) 17 g packet Take 34 g by mouth daily.     Semaglutide , 1 MG/DOSE, 4 MG/3ML SOPN Inject 1 mg into the skin once a week. 3 mL 0   senna-docusate (SENOKOT-S) 8.6-50 MG tablet Take 1 tablet by mouth at bedtime.     divalproex  (DEPAKOTE  ER) 500 MG 24 hr tablet Take 1 tablet (500 mg total) by mouth 2 (two) times daily. 60 tablet 2   No facility-administered medications prior to visit.    PAST MEDICAL HISTORY: Past Medical History:  Diagnosis Date   A-fib [facility])    Acute right-sided low back pain    Anxiety    Arrhythmia    Barrett esophagus    Chronic a-fib (HCC)    Closed fracture of left distal radius    Contact dermatitis 04/16/2017   Depression    Depression    Hypothyroidism    Low back pain    Morbid obesity, BMI unknown (HCC)    Obesity    Osteoarthritis    oa in bilateral knees   Schizophrenia (HCC)    Seizures (HCC)    Seizures (HCC)    Thyroid  disease    Varicose veins     PAST SURGICAL HISTORY: Past Surgical History:  Procedure Laterality Date   ACHILLES TENDON REPAIR  approx. 2004   right foot   ANTERIOR CERVICAL DECOMP/DISCECTOMY FUSION N/A 04/08/2023   Procedure: Anterior Cervical Discectomy Fusion Cervical Five-Cervical Six;  Surgeon: Lanis Pupa, MD;  Location: [facility] OR;  Service: Neurosurgery;  Laterality: N/A;   CATARACT EXTRACTION     bilateral    OPEN REDUCTION INTERNAL FIXATION (ORIF) DISTAL RADIAL FRACTURE Left 07/21/2018   Procedure: OPEN REDUCTION INTERNAL FIXATION (ORIF) DISTAL RADIAL FRACTURE;  Surgeon: Murrell Drivers, MD;  Location: [facility];  Service: Orthopedics;  Laterality: Left;   stab phlebectomy  Right 11/09/2016   stab phlebectomy R leg by Lynwood Collum MD   TOOTH EXTRACTION      FAMILY HISTORY: Family History  Problem Relation Age of Onset   OCD Mother    Barrett's esophagus Mother    Diabetes Father    Heart disease Father    Esophageal cancer Father    Diabetes Brother    Liver disease Neg Hx    Colon cancer Neg Hx     SOCIAL HISTORY: Social History   Socioeconomic History   Marital status: Single    Spouse name:  Not on file   Number of children: 0   Years of education: Not on file   Highest education level: Not on file  Occupational History   Occupation: disabled  Tobacco Use   Smoking status: Never   Smokeless tobacco: Never  Vaping Use   Vaping status: Never Used  Substance and Sexual Activity   Alcohol use: No    Alcohol/week: 0.0 standard drinks of alcohol   Drug use: No   Sexual activity: Never  Other Topics Concern   Not on file  Social History Narrative   Pt lives at [facility]    Pt retired    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: Low Risk  (02/13/2023)   Overall Financial Resource Strain (CARDIA)    Difficulty of Paying Living Expenses: Not hard at all  Food Insecurity: Patient Unable To Answer (12/22/2023)   Hunger Vital Sign    Worried About Running Out of Food in the Last Year: Patient unable to answer    Ran Out of Food in the Last Year: Patient unable to answer  Transportation Needs: Patient Unable To Answer (12/22/2023)   PRAPARE - Transportation    Lack of Transportation (Medical): Patient unable to answer    Lack of Transportation (Non-Medical): Patient unable to answer  Physical Activity: Inactive (02/13/2023)   Exercise  Vital Sign    Days of Exercise per Week: 0 days    Minutes of Exercise per Session: 0 min  Stress: No Stress Concern Present (02/13/2023)   [employer] of Occupational Health - Occupational Stress Questionnaire    Feeling of Stress : Not at all  Social Connections: Patient Unable To Answer (12/22/2023)   Social Connection and Isolation Panel    Frequency of Communication with Friends and Family: Patient unable to answer    Frequency of Social Gatherings with Friends and Family: Patient unable to answer    Attends Religious Services: Patient unable to answer    Active Member of Clubs or Organizations: Patient unable to answer    Attends Banker Meetings: Patient unable to answer    Marital Status: Patient unable to answer  Intimate Partner Violence: Patient Unable To Answer (12/22/2023)   Humiliation, Afraid, Rape, and Kick questionnaire    Fear of Current or Ex-Partner: Patient unable to answer    Emotionally Abused: Patient unable to answer    Physically Abused: Patient unable to answer    Sexually Abused: Patient unable to answer     PHYSICAL EXAM  GENERAL EXAM/CONSTITUTIONAL: Vitals:  Vitals:   12/28/23 1459  BP: 91/68  Pulse: (!) 58  SpO2: 95%  Height: 5' 10 (1.778 m)    Body mass index is 29.83 kg/m. Wt Readings from Last 3 Encounters:  12/22/23 207 lb 14.3 oz (94.3 kg)  12/21/23 208 lb (94.3 kg)  12/02/23 218 lb (98.9 kg)   Patient is in no distress; very pleasant middle-age Caucasian male, well developed, nourished and groomed; neck is supple  MUSCULOSKELETAL: Gait, strength, tone, movements noted in Neurologic exam below  NEUROLOGIC: MENTAL STATUS:  awake, alert, oriented to person, place and time recent memory mildly impaired and remote memory intact normal attention and concentration during visit language fluent, comprehension intact, naming intact fund of knowledge appropriate during visit  CRANIAL NERVE:  2nd - no papilledema on  fundoscopic exam 2nd, 3rd, 4th, 6th - pupils equal and reactive to light, visual fields full to confrontation, extraocular muscles intact, 2-3 beat nystagmus in all  directions 5th - facial sensation symmetric 7th - facial strength symmetric 8th - hearing intact 9th - palate elevates symmetrically, uvula midline 11th - shoulder shrug symmetric 12th - tongue protrusion midline MASKED FACIES  MOTOR:  normal bulk and tone, full strength in the BUE, BLE MILD RESTING TREMOR IN BUE AND INTERMITTENT RLE SLIGHT COGWHEELING RIGIDITY IN RUE > LUE MILD BRADYKINESIA IN BUE AND BLE  SENSORY:  normal and symmetric to light touch, temperature, vibration  COORDINATION:  finger-nose-finger, fine finger movements MILD INTENTION TREMOR L > R  REFLEXES:  deep tendon reflexes 1+ and symmetric  GAIT/STATION:  Deferred, in w/c     DIAGNOSTIC DATA (LABS, IMAGING, TESTING) - I reviewed patient records, labs, notes, testing and imaging myself where available.  Lab Results  Component Value Date   WBC 8.1 12/21/2023   HGB 13.8 12/21/2023   HCT 43.0 12/21/2023   MCV 92.1 12/21/2023   PLT 141 (L) 12/21/2023      Component Value Date/Time   NA 137 12/22/2023 0834   NA 139 12/21/2023 1140   K 3.3 (L) 12/22/2023 0834   CL 101 12/22/2023 0834   CO2 26 12/22/2023 0834   GLUCOSE 93 12/22/2023 0834   BUN 15 12/22/2023 0834   BUN 18 12/21/2023 1140   CREATININE 0.84 12/22/2023 0834   CREATININE 0.94 07/16/2016 1446   CALCIUM  9.0 12/22/2023 0834   PROT 6.1 (L) 12/21/2023 2212   PROT 6.8 12/21/2023 1140   ALBUMIN  3.6 12/21/2023 2212   ALBUMIN  4.5 12/21/2023 1140   AST 14 (L) 12/21/2023 2212   ALT 5 12/21/2023 2212   ALKPHOS 62 12/21/2023 2212   BILITOT 0.7 12/21/2023 2212   BILITOT 0.5 12/21/2023 1140   GFRNONAA >60 12/22/2023 0834   GFRNONAA >89 07/16/2016 1446   GFRAA 103 11/02/2019 1711   GFRAA >89 07/16/2016 1446   Lab Results  Component Value Date   CHOL 126 09/08/2022   HDL 32  (L) 09/08/2022   LDLCALC 64 09/08/2022   TRIG 178 (H) 09/08/2022   CHOLHDL 3.9 09/08/2022   Lab Results  Component Value Date   HGBA1C 5.5 12/21/2023   Lab Results  Component Value Date   VITAMINB12 572 12/22/2023   Lab Results  Component Value Date   TSH 0.406 12/21/2023    12/21/21 CT head  1. No acute intracranial pathology. Moderate age-related atrophy and chronic microvascular ischemic changes. 2. No acute cervical spine fracture or subluxation.  03/23/23 CT head  1.  No evidence of an acute intracranial abnormality 2. Moderate chronic small vessel ischemic changes within the cerebral white matter. 3. Redemonstrated small chronic lacunar infarct within the anterior limb of left internal capsule. 4. Moderate-to-advanced generalized cerebral atrophy.    ASSESSMENT AND PLAN  64 y.o. year old male here with:   Dx:  1. Tremor   2. Drug-induced parkinsonism (HCC)   3. Seizures (HCC)   4. Frontotemporal lobar degeneration (HCC)   5. Gait disturbance       PLAN:  Tremors (drug induced parkinsonism) Gait impairment - likely drug induced parkinsonism from perphenazine  - some tremor from depakote  as well - continue working with PT at ALF, advised use of AD at all times. Discussed importance of adequate water intake - continue medications per psychiatry - check DATscan (eval drug induced parkinson's vs idiopathic parkinson's disease)- will place new order, previously placed but patient unable to contact to be scheduled, previously hesitant to proceed but now would like to proceed with testing  History  of temporal lobe seizures (dx'd age 15 years old; staring spells; last major events at age ~63 years old) - Previously discussed reducing Depakote  dosage but declined interest, today willing to decrease - recommend decreasing Depakote  from 500mg  BID to 250mg  AM and 500mg  PM. Can consider further reducing at follow up visit. Per patient request, will notify his  psychiatrist re: medication change - Depakote  level 12/2023 112 (unclear if trough level), after holding for 2 days decreased to 93 - unclear event in Oct 2024 (confusion, fall)  Memory loss / attention difficulties (since ~2013) AMS (recent hospitalization) - MR brain 12/2023 chronic, progressive brain volume loss predominantly in the frontal and temporal lobes suspicious for early frontotemporal degeneration - recommend neuropsychology evaluation to help distinguish FTD from other neurodegenerative syndromes and known underlying psychiatric disorder     Follow up in 4-6 months with Dr. Margaret for further recommendations or call sooner if needed    I personally spent a total of 45 minutes in the care of the patient today including preparing to see the patient, getting/reviewing separately obtained history, performing a medically appropriate exam/evaluation, counseling and educating, placing orders, referring and communicating with other health care professionals, and documenting clinical information in the EHR. This is our first time meeting and time has been spent reviewing past medical history and relevant medical records.  Time also spent reviewing recent hospitalization.  Harlene Bogaert, AGNP-BC  [facility] [address] [place], [place] [address]  Phone [phone] Fax [phone] Note: This document was prepared with digital dictation and possible smart phrase technology. Any transcriptional errors that result from this process are unintentional.

## 2023-12-29 DIAGNOSIS — S8992XA Unspecified injury of left lower leg, initial encounter: Secondary | ICD-10-CM | POA: Diagnosis not present

## 2023-12-29 DIAGNOSIS — S8991XA Unspecified injury of right lower leg, initial encounter: Secondary | ICD-10-CM | POA: Diagnosis not present

## 2023-12-29 DIAGNOSIS — M79641 Pain in right hand: Secondary | ICD-10-CM | POA: Diagnosis not present

## 2023-12-29 DIAGNOSIS — S3992XA Unspecified injury of lower back, initial encounter: Secondary | ICD-10-CM | POA: Diagnosis not present

## 2023-12-29 DIAGNOSIS — I959 Hypotension, unspecified: Secondary | ICD-10-CM | POA: Diagnosis not present

## 2023-12-29 DIAGNOSIS — S0101XA Laceration without foreign body of scalp, initial encounter: Secondary | ICD-10-CM | POA: Diagnosis not present

## 2023-12-29 DIAGNOSIS — S0990XA Unspecified injury of head, initial encounter: Secondary | ICD-10-CM | POA: Diagnosis not present

## 2023-12-29 DIAGNOSIS — W19XXXA Unspecified fall, initial encounter: Secondary | ICD-10-CM | POA: Diagnosis not present

## 2023-12-29 DIAGNOSIS — M546 Pain in thoracic spine: Secondary | ICD-10-CM | POA: Diagnosis not present

## 2023-12-29 DIAGNOSIS — R519 Headache, unspecified: Secondary | ICD-10-CM | POA: Diagnosis not present

## 2023-12-29 DIAGNOSIS — M545 Low back pain, unspecified: Secondary | ICD-10-CM | POA: Diagnosis not present

## 2023-12-29 DIAGNOSIS — M25562 Pain in left knee: Secondary | ICD-10-CM | POA: Diagnosis not present

## 2023-12-29 DIAGNOSIS — M25561 Pain in right knee: Secondary | ICD-10-CM | POA: Diagnosis not present

## 2023-12-29 DIAGNOSIS — M542 Cervicalgia: Secondary | ICD-10-CM | POA: Diagnosis not present

## 2023-12-29 DIAGNOSIS — S299XXA Unspecified injury of thorax, initial encounter: Secondary | ICD-10-CM | POA: Diagnosis not present

## 2023-12-29 DIAGNOSIS — S199XXA Unspecified injury of neck, initial encounter: Secondary | ICD-10-CM | POA: Diagnosis not present

## 2023-12-30 DIAGNOSIS — R519 Headache, unspecified: Secondary | ICD-10-CM | POA: Diagnosis not present

## 2023-12-30 DIAGNOSIS — M542 Cervicalgia: Secondary | ICD-10-CM | POA: Diagnosis not present

## 2023-12-30 DIAGNOSIS — M546 Pain in thoracic spine: Secondary | ICD-10-CM | POA: Diagnosis not present

## 2023-12-30 DIAGNOSIS — S199XXA Unspecified injury of neck, initial encounter: Secondary | ICD-10-CM | POA: Diagnosis not present

## 2023-12-30 DIAGNOSIS — S0101XA Laceration without foreign body of scalp, initial encounter: Secondary | ICD-10-CM | POA: Diagnosis not present

## 2023-12-30 DIAGNOSIS — M25562 Pain in left knee: Secondary | ICD-10-CM | POA: Diagnosis not present

## 2023-12-30 DIAGNOSIS — S8991XA Unspecified injury of right lower leg, initial encounter: Secondary | ICD-10-CM | POA: Diagnosis not present

## 2023-12-30 DIAGNOSIS — M25561 Pain in right knee: Secondary | ICD-10-CM | POA: Diagnosis not present

## 2023-12-30 DIAGNOSIS — S299XXA Unspecified injury of thorax, initial encounter: Secondary | ICD-10-CM | POA: Diagnosis not present

## 2023-12-30 DIAGNOSIS — M79641 Pain in right hand: Secondary | ICD-10-CM | POA: Diagnosis not present

## 2023-12-30 DIAGNOSIS — S3992XA Unspecified injury of lower back, initial encounter: Secondary | ICD-10-CM | POA: Diagnosis not present

## 2023-12-30 DIAGNOSIS — M545 Low back pain, unspecified: Secondary | ICD-10-CM | POA: Diagnosis not present

## 2023-12-30 DIAGNOSIS — S8992XA Unspecified injury of left lower leg, initial encounter: Secondary | ICD-10-CM | POA: Diagnosis not present

## 2023-12-30 NOTE — ED Notes (Signed)
 Faxed medical certification for transport to EMS

## 2023-12-30 NOTE — ED Notes (Signed)
 EMS states unable to transport. Awaiting early AM when the sun rises for Mother to come get son. Mother states unable to drive in the dark

## 2023-12-30 NOTE — ED Notes (Signed)
 Mother Leopoldo Mazzie here to take son home

## 2023-12-30 NOTE — ED Provider Notes (Addendum)
 [facility] HEALTH [facility]  ED Provider Note  [name] 63 y.o. male DOB: 10/25/1960 MRN: [id number] History   Chief Complaint  Patient presents with  . Fall    Patient on Elliquis and laceration to the left posterior head   HPI Patient presents to the emergency department after a fall.  Patient states he was walking got his leg caught on a table and fell.  Hit his head.  Is on Eliquis  for history of atrial fibrillation.  Patient is a very poor historian I do not know if he lost consciousness or at all.  Denies any lightheadedness dizziness or chest pain prior to this fall.  Complains of pain in bilateral knees bilateral hands and his back throughout his back.    Past Medical History:  Diagnosis Date  . Depression   . Disease of thyroid  gland   . Gout   . Hypertension     Past Surgical History:  Procedure Laterality Date  . Achilles tendon repair Right     Social History   Substance and Sexual Activity  Alcohol Use No   Tobacco Use History[1] E-Cigarettes  . Vaping Use    . Start Date    . Cartridges/Day    . Quit Date     Social History   Substance and Sexual Activity  Drug Use Not on file         Allergies[2]  Home Medications   ALLOPURINOL  (ZYLOPRIM ) 300 MG TABLET    Take 300 mg by mouth daily.   ASPIRIN  (ASPIRIN ) 81 MG CHEWABLE TABLET    Chew 81 mg by mouth 2 (two) times daily.   ATENOLOL  (TENORMIN ) 25 MG TABLET    Take 25 mg by mouth 2 (two) times daily.   BENZTROPINE  (COGENTIN ) 1 MG TABLET    Take 1 mg by mouth 2 (two) times daily.   DICLOFENAC  (VOLTAREN ) 75 MG EC TABLET    Take 75 mg by mouth 2 (two) times daily.   DIVALPROEX  (DEPAKOTE ) 500 MG 24 HR TABLET    Take 500 mg by mouth daily.   FISH OIL-OMEGA-3 FATTY ACIDS  1000 MG CAPSULE    Take 2 g by mouth daily.   LEVOTHYROXINE  (SYNTHROID , LEVOTHROID) 137 MCG TABLET    Take 137 mcg by mouth daily.   PERPHENAZINE  (TRILAFON ) 8 MG TABLET    Take 16 mg by mouth at bedtime.    PHENELZINE  (NARDIL ) 15 MG TABLET    Take 15 mg by mouth 3 (three) times a day.    Primary Survey  Primary Survey  Review of Systems   Review of Systems  Physical Exam   ED Triage Vitals [12/30/23 0005]  BP 108/83  Heart Rate 65  Resp 20  SpO2 97 %  Temp 98.1 F (36.7 C)    Physical Exam General: awake, alert, no acute distress Eyes: Nonjaundiced, symmetric ENT: Airway intact, hearing grossly normal Cardiac: Heart rate normal Pulmonary: Lungs clear to auscultation bilaterally, no use of accessory muscles of respiration, no respiratory distress Abdomen: Soft, nontender Skin:  laceration over the left superior parietal region Neuro: No focal deficits Psych: Calm, cooperative   ED Course   Lab results:   CBC AND DIFFERENTIAL - Abnormal      Result Value   WBC 8.9     RBC 3.87 (*)    HGB 11.9 (*)    HCT 33.8 (*)    MCV 87.3     MCH 30.7     MCHC 35.2  Plt Ct 118 (*)    RDW SD 48.0 (*)    MPV 11.7     NRBC% 0.0     Absolute NRBC Count 0.00     NEUTROPHIL % 64.8     LYMPHOCYTE % 14.1     MONOCYTE % 11.4     Eosinophil % 9.0     BASOPHIL % 0.5     IG% 0.2     ABSOLUTE NEUTROPHIL COUNT 5.73     ABSOLUTE LYMPHOCYTE COUNT 1.25     Absolute Monocyte Count 1.01 (*)    Absolute Eosinophil Count 0.80 (*)    Absolute Basophil Count 0.04     Absolute Immature Granulocyte Count 0.02    BASIC METABOLIC PANEL - Abnormal   Na 133 (*)    Potassium 3.9     Cl 103     CO2 22     AGAP 8     Glucose 90     BUN 11     Creatinine 0.57 (*)    Ca 7.0 (*)    BUN/CREAT RATIO 19.3     eGFR 109     Comment: Normal GFR (glomerular filtration rate) > 60 mL/min/1.73 meters squared, < 60 may include impaired kidney function. Calculation based on the Chronic Kidney Disease Epidemiology Collaboration (CK-EPI)equation refit without adjustment for race.  MAGNESIUM  - Abnormal   Mg 1.4 (*)   PROTIME-INR - Abnormal   PT 15.9 (*)    Comment: Many commonly administered drugs may  affect the results obtained in PT and PTT testing.   INR 1.3     Comment: INR Therapeutic Range for DVT, PE:      2.0 - 3.0 INR Mechanical Prosthetic Heart Valves: 2.5 - 3.5  ETHANOL - Normal   Ethanol <10     Comment: Blood Alcohol Level is for Medical Purposes Only.  PTT - Normal   PTT 34     Comment: Many commonly administered drugs may affect the results obtained in PT and PTT testing.    Imaging:   CT HEAD WO CONTRAST   Narrative:    HEAD CT WITHOUT INJECTED CONTRAST  TECHNIQUE: Axial scans were obtained through the brain without contrast. CT dose reduction techniques were utilized.   PROVIDED CLINICAL INDICATION: Trauma Head ADDITIONAL CLINICAL INDICATION: None available   COMPARISON: 06/07/2022  FINDINGS:   The ventricles and sulci are enlarged, with symmetric configuration, consistent with diffuse parenchymal loss. The basilar cisterns are preserved.  No intracranial hemorrhage or extra-axial collection. Sagittal sinus gas is likely related to IV use.  Grey-white matter differentiation is preserved. Hypoattenuating foci in the subcortical white matter are non-specific however likely the sequelae of chronic small vessel ischemic disease.  The visualized intra-orbital contents and superficial soft tissues are unremarkable.  The mastoid air cells and visualized paranasal sinuses are pneumatized and clear.   No aggressive calvarial lesion or displaced skull fracture.    Impression:    IMPRESSION:    No acute intracranial abnormality.  Electronically Signed by: Donnice Crooked on 12/30/2023 1:10 AM  CT SPINE CERVICAL WO CONTRAST   Narrative:    EXAMINATION:  CT SPINE CERVICAL WO CONTRAST 12/30/2023 1:02 AM  HISTORY/REASON FOR EXAM: Other Trauma. Neck pain  TECHNIQUE/EXAM DESCRIPTION: CT cervical spine without contrast, with reconstructions.  Thin-section helical scanning was performed from the skull base through T1.  Sagittal and coronal multiplanar  reconstructions were generated from the axial images.  Low dose optimization technique was utilized for this CT exam  including automated exposure control and adjustment of the mA and/or kV according to patient size.  COMPARISON:  None available.  FINDINGS:  Alignment: No traumatic listhesis. Vertebral stature/trauma: No acute fracture is identified Degenerative changes: Anterior plate with screws and interbody grafts spans from C4 through C7. There are bulky flowing osteophytes consistent with diffuse idiopathic skeletal hyperostosis. There is moderate to severe multilevel facet arthropathy. Remote T7 spinous process fracture. There is mild multilevel narrowing of the thecal sac. Prevertebral soft tissues: Unremarkable. Lung apices: Unremarkable. Soft tissues: Scattered arterial calcifications     Impression:    IMPRESSION: 1.  No acute fractures identified     Electronically Signed by: Dorothyann Sharps on 12/30/2023 1:20 AM  XR PELVIS 1-2 VIEWS   Narrative:    XR PELVIS 1-2 VIEWS  PROVIDED CLINICAL INDICATION: Other Trauma ADDITIONAL CLINICAL INDICATION: None available   COMPARISON: None available    Impression:    FINDINGS/IMPRESSION:   No evidence of acute displaced fracture.   Soft tissues are within normal limits.  Electronically Signed by: Donnice Crooked on 12/30/2023 1:04 AM  XR CHEST AP PORTABLE   Narrative:    PROCEDURE:  XR CHEST AP PORTABLE.  12/30/2023 12:15 AM.  TECHNIQUE:  Single  view chest  HISTORY:  Trauma  COMPARISON:  None    Impression:    FINDINGS/Impression:    The cardiac silhouette is normal in size.  No pneumothorax or layering pleural effusions.  The lungs are clear.   No acute displaced osseous abnormality.   Electronically Signed by: Donnice Crooked on 12/30/2023 1:05 AM  XR HAND 3+ VIEWS RIGHT   Narrative:    XR HAND 3+ VIEWS RIGHT  PROVIDED CLINICAL INDICATION: Other closed extremity injury with pain and/or  deformity ADDITIONAL CLINICAL INDICATION: Right hand pain   COMPARISON: None available  INTERPRETATION:   No acute fracture or malalignment is identified. An ossific fragment distal to the ulnar styloid may be related to remote trauma. Soft tissues are grossly unremarkable.    Impression:    IMPRESSION:  No acute fracture is identified  Electronically Signed by: Dorothyann Sharps on 12/30/2023 1:08 AM  XR KNEE 3 VIEWS LEFT   Narrative:    PROCEDURE:  XR KNEE 3 VIEWS LEFT.  12/30/2023 12:15 AM.  TECHNIQUE:  3 views of the left knee.  HISTORY:  Injury  COMPARISON:  None    Impression:    FINDINGS/IMPRESSION:    No acute displaced fracture, dislocation, or osseus lesion about the knee.  Tricompartmental degenerative disease is manifested by joint space narrowing and osteophytes.  No radiographically evident soft tissue abnormality. There may be a tiny suprapatellar joint effusion.  No radiopaque foreign bodies.     Electronically Signed by: Donnice Crooked on 12/30/2023 1:07 AM  XR SPINE LUMBAR 2-3 VIEWS   Narrative:    XR SPINE LUMBAR 2-3 VIEWS  PROVIDED CLINICAL INDICATION: Fall ADDITIONAL CLINICAL INDICATION: None available   COMPARISON: None available  INTERPRETATION:  No acute fractures identified. There is diffuse idiopathic skeletal hyperostosis. SI joints are unremarkable. Paravertebral soft tissues are unremarkable.    Impression:    IMPRESSION:  No acute fracture is identified.  Electronically Signed by: Dorothyann Sharps on 12/30/2023 1:07 AM  XR SPINE THORACIC 2 VIEWS   Narrative:    XR SPINE THORACIC 2 VIEWS  PROVIDED CLINICAL INDICATION: Other blunt trauma, fall ADDITIONAL CLINICAL INDICATION: Pain following a fall   COMPARISON: None available  INTERPRETATION:     Mild spinal curvature, convex right. No acute  fractures identified. There are bulky flowing osteophytes consistent with diffuse idiopathic skeletal hyperostosis. Paraspinal tissues  are grossly unremarkable. Mediastinal contours are grossly normal. Prior cervical fusion is partially visualized    Impression:    IMPRESSION:  No acute fracture identified  Electronically Signed by: Dorothyann Sharps on 12/30/2023 1:07 AM  XR HAND 3+ VIEWS LEFT   Narrative:    XR HAND 3+ VIEWS LEFT  PROVIDED CLINICAL INDICATION: Other closed extremity injury with pain and/or deformity ADDITIONAL CLINICAL INDICATION: Pain following a fall   COMPARISON: None available  INTERPRETATION:   No acute fracture is identified. Partially visualized plate and screw fixation of the distal left radius.    Impression:    IMPRESSION:  No acute fracture is identified  Electronically Signed by: Dorothyann Sharps on 12/30/2023 1:04 AM  XR KNEE 3 VIEWS RIGHT   Narrative:    XR KNEE 3 VIEWS RIGHT  PROVIDED CLINICAL INDICATION: Other closed extremity injury with pain and/or deformity ADDITIONAL CLINICAL INDICATION: Pain following trauma   COMPARISON: None available  INTERPRETATION:   No acute fracture or malalignment. There is hypertrophy tibial spines. Small osteophytes project from the medial lateral compartments as well as the posterior patella. Enthesophytes project from the patella and tibial tuberosity. No significant knee effusion is identified.    Impression:    IMPRESSION:  No acute fractures identified  Electronically Signed by: Dorothyann Sharps on 12/30/2023 1:03 AM      ECG: ECG Results   None                                                                     Pre-Sedation Procedures    Medical Decision Making Amount and/or Complexity of Data Reviewed Labs: ordered. Radiology: ordered.  Risk Prescription drug management.   CT head ordered for signs of acute intracranial hemorrhage CT cervical spine ordered signs of cervical fracture x-ray of the thoracic and lumbar spine given the back tenderness, patient has a splint  abrasion left knee this was x-rays ordered here but he also complains of pain in his right knee his x-ray was ordered for there along with bilateral hands because of the pain there.  BMP and CBC shows no significant normalities.  Ethanol negative.  INR minimally elevated 1.3.  Magnesium  low at 1.4.  CT head shows no traumatic injury laceration repair patient up and ambulatory with a walker which is his baseline.  I have explained to the patient in appropriate terminology their diagnosis and provided anticipatory guidance for further management as an outpatient. I told them that we cannot definitively rule out a life-threatening cause of their presenting symptoms during an emergency department visit, therefore they are absolutely required to follow-up with a physician to further assess them for risks of life-threatening causes of their symptoms. If the patient cannot follow-up as an outpatient within a reasonable time period, that is, a few days, they should return to the ED so that we could help facilitate follow-up. I also explained to the patient that they were correct to present to the emergency department for their symptoms as I would prefer for them to err on the side of safety with this complaint and to continue to do so in the future. The patient has verbalized their understanding  and at this time I feel they are stable for discharge  This chart was dedicated using dragon dictation software and there may be some slight irregularities secondary to this.          Provider Communication  New Prescriptions   No medications on file    Modified Medications   No medications on file    Discontinued Medications   No medications on file    Clinical Impression Final diagnoses:  Closed head injury, initial encounter  Laceration of head without foreign body, unspecified part of head, initial encounter  Neck pain  Hypomagnesemia    ED Disposition     ED Disposition  Discharge    Condition  Stable   Comment  --                 Follow-up Information     Schedule an appointment as soon as possible for a visit  with [facility] (PCP Request).   Specialty: Care Coordination Contact information: [phone]        [facility] Emergency Department.   Specialty: Emergency Medicine Comments: If symptoms worsen Contact information: [address] [place]  [id number] [phone]                 Electronically signed by:    Wadie Dumas [facility], MD 12/30/23 0252    Wadie Dumas Sik Erna Purpura, MD 12/30/23 [phone]      [1] Social History Tobacco Use  Smoking Status Never  Smokeless Tobacco Not on file  [2] No Known Allergies

## 2024-01-03 ENCOUNTER — Encounter: Payer: Self-pay | Admitting: Student

## 2024-01-03 ENCOUNTER — Telehealth: Payer: Self-pay

## 2024-01-03 ENCOUNTER — Ambulatory Visit: Admitting: Student

## 2024-01-03 VITALS — BP 108/77 | HR 91 | Ht 70.0 in | Wt 210.8 lb

## 2024-01-03 DIAGNOSIS — Z7901 Long term (current) use of anticoagulants: Secondary | ICD-10-CM | POA: Diagnosis not present

## 2024-01-03 DIAGNOSIS — F0280 Dementia in other diseases classified elsewhere without behavioral disturbance: Secondary | ICD-10-CM | POA: Diagnosis not present

## 2024-01-03 DIAGNOSIS — E1169 Type 2 diabetes mellitus with other specified complication: Secondary | ICD-10-CM | POA: Diagnosis not present

## 2024-01-03 DIAGNOSIS — R634 Abnormal weight loss: Secondary | ICD-10-CM | POA: Diagnosis not present

## 2024-01-03 DIAGNOSIS — S0990XD Unspecified injury of head, subsequent encounter: Secondary | ICD-10-CM

## 2024-01-03 DIAGNOSIS — F419 Anxiety disorder, unspecified: Secondary | ICD-10-CM | POA: Diagnosis not present

## 2024-01-03 DIAGNOSIS — G3109 Other frontotemporal dementia: Secondary | ICD-10-CM

## 2024-01-03 DIAGNOSIS — E785 Hyperlipidemia, unspecified: Secondary | ICD-10-CM | POA: Diagnosis not present

## 2024-01-03 DIAGNOSIS — M5416 Radiculopathy, lumbar region: Secondary | ICD-10-CM | POA: Diagnosis not present

## 2024-01-03 DIAGNOSIS — Z6830 Body mass index (BMI) 30.0-30.9, adult: Secondary | ICD-10-CM | POA: Diagnosis not present

## 2024-01-03 DIAGNOSIS — E669 Obesity, unspecified: Secondary | ICD-10-CM | POA: Diagnosis not present

## 2024-01-03 DIAGNOSIS — E039 Hypothyroidism, unspecified: Secondary | ICD-10-CM | POA: Diagnosis not present

## 2024-01-03 DIAGNOSIS — F32A Depression, unspecified: Secondary | ICD-10-CM | POA: Diagnosis not present

## 2024-01-03 DIAGNOSIS — F251 Schizoaffective disorder, depressive type: Secondary | ICD-10-CM | POA: Diagnosis not present

## 2024-01-03 DIAGNOSIS — R296 Repeated falls: Secondary | ICD-10-CM

## 2024-01-03 DIAGNOSIS — Z7985 Long-term (current) use of injectable non-insulin antidiabetic drugs: Secondary | ICD-10-CM | POA: Diagnosis not present

## 2024-01-03 DIAGNOSIS — G40909 Epilepsy, unspecified, not intractable, without status epilepticus: Secondary | ICD-10-CM | POA: Diagnosis not present

## 2024-01-03 DIAGNOSIS — M48061 Spinal stenosis, lumbar region without neurogenic claudication: Secondary | ICD-10-CM | POA: Diagnosis not present

## 2024-01-03 DIAGNOSIS — Z9181 History of falling: Secondary | ICD-10-CM | POA: Diagnosis not present

## 2024-01-03 DIAGNOSIS — I4819 Other persistent atrial fibrillation: Secondary | ICD-10-CM | POA: Diagnosis not present

## 2024-01-03 NOTE — Assessment & Plan Note (Signed)
 Weight check today to 210.8, up from 207 at discharge from hospital.  We do suspect that 2 mg of Ozempic  was the cause. - Continue with 1 mg Ozempic , consider further taper if necessary - Recheck weight at next visit in approximately 4 weeks

## 2024-01-03 NOTE — Assessment & Plan Note (Signed)
 Seen by psychiatry inpatient, no change to his psychiatry medications.  Again these are likely leading to his tremors, and neurology is working towards differentiating medication induced versus idiopathic Parkinson's disease. - Follow-up with neurology - Continue Depakote  250 mg in the a.m./500 mg evening - Continue perphenazine  and Nardil 

## 2024-01-03 NOTE — Telephone Encounter (Signed)
 DME Rollator ordered by PCP.   Community message sent to [employer] for processing.

## 2024-01-03 NOTE — Telephone Encounter (Signed)
 Patient's mother calls nurse line regarding upcoming visit this afternoon.   Mother reports that patient had a fall on Wednesday, 6/25. He was taken to the ED and had to receive 3 staples to the left side of his head.   They advised that these would need to be removed in about 7 days. Mother is asking if Dr. Howell can evaluate the area this afternoon and remove the staples if appropriate. She would like to try to save an additional trip into the office.   Advised that staple removal would be dependent on how area is healing.   Advised that I would forward message to provider.   Chiquita JAYSON English, RN

## 2024-01-03 NOTE — Progress Notes (Addendum)
    SUBJECTIVE:   CHIEF COMPLAINT / HPI:   Closed head injury Patient suffered a fall on 12/29/2023.  Went to [facility] ED.  CT head/CT neck negative for acute pathology.  He received 5 staples to scalp, located over left temporal/parietal bone.  He denies having a fall.  Hospital follow-up: AMS schizoaffective disorder Patient was admitted on 12/21/2023 for altered mental status and discharged on 12/23/2023.  He returned to his baseline on day of discharge.  Workup for AMS was unremarkable including electrolyte derangements, infection, stroke.  MRI brain without contrast revealed chronic and progressive brain volume loss predominantly in the frontal and temporal lobes-suspicious for frontotemporal degeneration.  He has an appointment with neurology this upcoming October.  Additionally, patient found to be quite dehydrated after increasing his Ozempic  to 2 mg weekly-he was reduced back to 1 mg weekly at discharge.  Today, patient is improved but does appear more confused than when he was seen several weeks ago.  Hypothyroidism Patient currently takes Synthroid .  Noted to have a TSH that was WNL, however was decreasing-recommendation to recheck in 4 to 6 weeks, too soon to check today.  Will have him come back in approximately 4 weeks  OBJECTIVE:   BP 108/77   Pulse 91   Ht 5' 10 (1.778 m)   SpO2 98%   BMI 29.83 kg/m    General: NAD, pleasant, sitting in wheelchair HEENT: Normocephalic.  Approximate 3 cm scalp laceration over left parietal/temporal bone with staples in place, granulation tissue, hemostatic, no drainage no signs of infection.  Cardio: Irregular rate and rhythm (known A-fib on Eliquis ), no MRG. Cap Refill <2s. Respiratory: CTAB, normal wob on RA Skin: Warm and dry Neuro: Oriented to person and time.  Disoriented to location and situation, however patient does orient quickly when prompted.  ASSESSMENT/PLAN:   Assessment & Plan Closed head injury, subsequent  encounter Healing, low concern for intracranial bleed. - Strict ED precautions - Follow-up in 2 days for staple removal Multiple falls Recent fall 6/25 leading to closed injury above.  Would likely benefit from DME rollator, in addition chronic lumbar radiculopathy and his newly diagnosed frontotemporal lobar degeneration will continue to lead to gait instability, and he would benefit from rollator. - Continue PT at ALF Frontotemporal lobar degeneration (HCC) Oriented to self and time only.  Decreased from his baseline a month ago where he was fully alert and oriented x 4. - Follow-up with neurology Schizoaffective disorder, depressive type [facility]) Seen by psychiatry inpatient, no change to his psychiatry medications.  Again these are likely leading to his tremors, and neurology is working towards differentiating medication induced versus idiopathic Parkinson's disease. - Follow-up with neurology - Continue Depakote  250 mg in the a.m./500 mg evening - Continue perphenazine  and Nardil  Loss of weight Weight check today to 210.8, up from 207 at discharge from hospital.  We do suspect that 2 mg of Ozempic  was the cause. - Continue with 1 mg Ozempic , consider further taper if necessary - Recheck weight at next visit in approximately 4 weeks   [name], DO [facility]

## 2024-01-03 NOTE — Assessment & Plan Note (Signed)
 Recent fall 6/25 leading to closed injury above.  Would likely benefit from DME rollator, in addition chronic lumbar radiculopathy and his newly diagnosed frontotemporal lobar degeneration will continue to lead to gait instability, and he would benefit from rollator. - Continue PT at ALF

## 2024-01-03 NOTE — Patient Instructions (Addendum)
 It was great to see you! Thank you for allowing me to participate in your care!   I recommend that you always bring your medications to each appointment as this makes it easy to ensure we are on the correct medications and helps us  not miss when refills are needed.  Our plans for today:  - Please ensure patient is adequately hydrated throughout the day. - Please rise from chair slowly, to prevent dizziness. - I have ordered a rollator with seat, hopefully insurance will cover this DME. - Please follow-up with PCP in 4 weeks to recheck thyroid  - Please follow-up in 2 days to remove staples - Please follow-up with neurology - Please continue physical therapy at assisted living facility   Take care and seek immediate care sooner if you develop any concerns. Please remember to show up 15 minutes before your scheduled appointment time!  [name], DO [facility]

## 2024-01-04 ENCOUNTER — Ambulatory Visit: Admitting: Student

## 2024-01-04 NOTE — Telephone Encounter (Signed)
 See message from [employer]  This patient received a Walker 05/11/2023. His insurances will not cover another Ambulatory aid for 3 years (Medicaid) and 5 years Administrator). We cannot offer private pay due to patient having Medicaid.  Called patient to discuss, however no answer.   He can take prescription to a DME store locally in [place], however he will have to pay out of pocket. He can also try [employer].   Will await his return call to inform.

## 2024-01-05 DIAGNOSIS — E039 Hypothyroidism, unspecified: Secondary | ICD-10-CM | POA: Diagnosis not present

## 2024-01-05 DIAGNOSIS — M48061 Spinal stenosis, lumbar region without neurogenic claudication: Secondary | ICD-10-CM | POA: Diagnosis not present

## 2024-01-05 DIAGNOSIS — E1169 Type 2 diabetes mellitus with other specified complication: Secondary | ICD-10-CM | POA: Diagnosis not present

## 2024-01-05 DIAGNOSIS — G40909 Epilepsy, unspecified, not intractable, without status epilepticus: Secondary | ICD-10-CM | POA: Diagnosis not present

## 2024-01-05 DIAGNOSIS — F32A Depression, unspecified: Secondary | ICD-10-CM | POA: Diagnosis not present

## 2024-01-05 DIAGNOSIS — F251 Schizoaffective disorder, depressive type: Secondary | ICD-10-CM | POA: Diagnosis not present

## 2024-01-05 DIAGNOSIS — I4819 Other persistent atrial fibrillation: Secondary | ICD-10-CM | POA: Diagnosis not present

## 2024-01-05 DIAGNOSIS — Z9181 History of falling: Secondary | ICD-10-CM | POA: Diagnosis not present

## 2024-01-05 DIAGNOSIS — F419 Anxiety disorder, unspecified: Secondary | ICD-10-CM | POA: Diagnosis not present

## 2024-01-05 DIAGNOSIS — Z6830 Body mass index (BMI) 30.0-30.9, adult: Secondary | ICD-10-CM | POA: Diagnosis not present

## 2024-01-05 DIAGNOSIS — R296 Repeated falls: Secondary | ICD-10-CM | POA: Diagnosis not present

## 2024-01-05 DIAGNOSIS — E785 Hyperlipidemia, unspecified: Secondary | ICD-10-CM | POA: Diagnosis not present

## 2024-01-05 DIAGNOSIS — Z7901 Long term (current) use of anticoagulants: Secondary | ICD-10-CM | POA: Diagnosis not present

## 2024-01-05 DIAGNOSIS — Z7985 Long-term (current) use of injectable non-insulin antidiabetic drugs: Secondary | ICD-10-CM | POA: Diagnosis not present

## 2024-01-05 DIAGNOSIS — E669 Obesity, unspecified: Secondary | ICD-10-CM | POA: Diagnosis not present

## 2024-01-06 ENCOUNTER — Ambulatory Visit (INDEPENDENT_AMBULATORY_CARE_PROVIDER_SITE_OTHER)

## 2024-01-06 VITALS — BP 112/74 | HR 84 | Wt 207.0 lb

## 2024-01-06 DIAGNOSIS — Z4802 Encounter for removal of sutures: Secondary | ICD-10-CM | POA: Diagnosis not present

## 2024-01-06 NOTE — Progress Notes (Signed)
    SUBJECTIVE:   CHIEF COMPLAINT / HPI: Staple Removal   [name] is a 63 YO Male present at [facility] today for his staple removal. Pt w/ hx of recurrent fall. Last fall was on 12/30/2023 which resulted in closed head injury required laceration repaired at [facility] ED. Per pt he has been doing well, states has not have any Fall since last ED visit. Denies hallucination or change in mental status. Pt present at [facility] today for staples removal.   Recurrent Fall Last Fall on 6.26.2025 result in closed head injury required repaired by staples.    PERTINENT  PMH / PSH:  Recurrent Fall  Closed Head Injury  AMS schizoaffective disorder Hypothyroidism  A-fib Type 2 DM HLD Seizure  OBJECTIVE:   BP 112/74   Pulse 84   Wt 207 lb (93.9 kg)   SpO2 95%   BMI 29.70 kg/m    Physical Exam General: awake, alert, no acute distress Cardiac: Heart rate normal Pulmonary: Lungs clear to auscultation bilaterally, no use of accessory muscles of respiration, no respiratory distress Abdomen: Soft, nontender Skin:  laceration over the left superior parietal region w/ 5 individual staples no bleeding or discharge Neuro: No focal deficits Psych: Calm, cooperative   ASSESSMENT/PLAN:   Assessment & Plan Removal of staple 5 staples was removed today without complication. Incision clean dry intact after staple removal     Plan was discussed with Dr. Malka, attending physician who agreed with plan and present during stples removal.   Houston Samuels, DO PGY1 Family Medicine Resident [facility]

## 2024-01-06 NOTE — Patient Instructions (Signed)
    It was great to see you!  Our plans for today:  - Staple removal during this visit - Follow up with Dr. Howell on 01/24/24 as scheduled   Take care and seek immediate care sooner if you develop any concerns.

## 2024-01-11 ENCOUNTER — Encounter (HOSPITAL_COMMUNITY): Payer: Self-pay | Admitting: *Deleted

## 2024-01-11 ENCOUNTER — Emergency Department (HOSPITAL_COMMUNITY)
Admission: EM | Admit: 2024-01-11 | Discharge: 2024-01-11 | Disposition: A | Discharge status: Home / Self Care | Source: Skilled Nursing Facility | Attending: Emergency Medicine | Admitting: Emergency Medicine

## 2024-01-11 ENCOUNTER — Emergency Department (HOSPITAL_COMMUNITY)

## 2024-01-11 ENCOUNTER — Other Ambulatory Visit: Payer: Self-pay

## 2024-01-11 DIAGNOSIS — S0990XA Unspecified injury of head, initial encounter: Secondary | ICD-10-CM | POA: Insufficient documentation

## 2024-01-11 DIAGNOSIS — M79673 Pain in unspecified foot: Secondary | ICD-10-CM | POA: Diagnosis not present

## 2024-01-11 DIAGNOSIS — R531 Weakness: Secondary | ICD-10-CM | POA: Diagnosis not present

## 2024-01-11 DIAGNOSIS — S199XXA Unspecified injury of neck, initial encounter: Secondary | ICD-10-CM | POA: Diagnosis not present

## 2024-01-11 DIAGNOSIS — Z7901 Long term (current) use of anticoagulants: Secondary | ICD-10-CM | POA: Insufficient documentation

## 2024-01-11 DIAGNOSIS — W01198A Fall on same level from slipping, tripping and stumbling with subsequent striking against other object, initial encounter: Secondary | ICD-10-CM | POA: Insufficient documentation

## 2024-01-11 DIAGNOSIS — Z743 Need for continuous supervision: Secondary | ICD-10-CM | POA: Diagnosis not present

## 2024-01-11 NOTE — ED Provider Notes (Signed)
 [place] EMERGENCY DEPARTMENT AT [facility] Provider Note   CSN: [id number] Arrival date & time: 01/11/24  1421     Patient presents with: No chief complaint on file.   [name] is a 63 y.o. male.   HPI   63 year old male with medical history significant for thyroid  disease, seizure disorder, morbid obesity, atrial fibrillation on Eliquis , schizophrenia presenting to the emergency department as a nonlevel trauma after walking into a wall.  The patient states that he was walking and did not notice where he was going when he walks straight into a wall striking the front of his head on the wall.  He denies any loss of consciousness.  He did not fall to the ground or sustain any other injury.  He arrives GCS 15, c-collar in place, ABC intact.  Prior to Admission medications   Medication Sig Start Date End Date Taking? Authorizing Provider  allopurinol  (ZYLOPRIM ) 300 MG tablet TAKE 1 TABLET BY MOUTH EVERY DAY 10/01/22   Malvina Ellen, MD  apixaban  (ELIQUIS ) 5 MG TABS tablet Take 1 tablet (5 mg total) by mouth 2 (two) times daily. 10/19/22   McDiarmid, Krystal BIRCH, MD  atorvastatin  (LIPITOR) 40 MG tablet TAKE 1 TABLET BY MOUTH EVERY DAY 04/15/22   Lilland, Alana, DO  benztropine  (COGENTIN ) 0.5 MG tablet Take 1 tablet (0.5 mg total) by mouth 2 (two) times daily. 12/02/23   Arfeen, Leni DASEN, MD  divalproex  (DEPAKOTE  ER) 250 MG 24 hr tablet Take 1 tablet (250 mg total) by mouth every morning AND 2 tablets (500 mg total) at bedtime. 12/28/23   Whitfield Raisin, NP  levothyroxine  (SYNTHROID ) 125 MCG tablet Take 1 tablet (125 mcg total) by mouth daily. 04/16/23   Dameron, Marisa, DO  omeprazole  (PRILOSEC) 40 MG capsule Take 1 capsule (40 mg total) by mouth daily. 07/13/23   Howell Lunger, DO  perphenazine  (TRILAFON ) 8 MG tablet Take 3 tablets (24 mg total) by mouth at bedtime. 12/02/23   Arfeen, Leni DASEN, MD  phenelzine  (NARDIL ) 15 MG tablet Take 3 tablets (45 mg total) by mouth 2 (two) times  daily. 12/02/23   Arfeen, Leni DASEN, MD  polyethylene glycol (MIRALAX  / GLYCOLAX ) 17 g packet Take 34 g by mouth daily. 04/16/23   Dameron, Marisa, DO  Semaglutide , 1 MG/DOSE, 4 MG/3ML SOPN Inject 1 mg into the skin once a week. 12/21/23   Christia Budds, MD  senna-docusate (SENOKOT-S) 8.6-50 MG tablet Take 1 tablet by mouth at bedtime. 04/16/23   Dameron, Marisa, DO    Allergies: Lamictal  [lamotrigine ] and Metformin  and related    Review of Systems  All other systems reviewed and are negative.   Updated Vital Signs BP 108/65   Pulse 80   Temp 98.1 F (36.7 C) (Oral)   Resp 16   Wt 93.9 kg   SpO2 93%   BMI 29.70 kg/m   Physical Exam Vitals and nursing note reviewed.  Constitutional:      Appearance: He is well-developed.     Comments: GCS 15, ABC intact  HENT:     Head: Normocephalic.  Eyes:     Conjunctiva/sclera: Conjunctivae normal.  Neck:     Comments: C-collar in place.  No midline tenderness to palpation of the cervical spine.  Cardiovascular:     Rate and Rhythm: Normal rate and regular rhythm.     Heart sounds: No murmur heard. Pulmonary:     Effort: Pulmonary effort is normal. No respiratory distress.  Breath sounds: Normal breath sounds.  Chest:     Comments: Chest wall stable and non-tender to AP and lateral compression. Clavicles stable and non-tender to AP compression Abdominal:     Palpations: Abdomen is soft.     Tenderness: There is no abdominal tenderness.     Comments: Pelvis stable to lateral compression.  Musculoskeletal:     Cervical back: Neck supple.     Comments: No midline tenderness to palpation of the thoracic or lumbar spine. Extremities atraumatic with intact ROM.   Skin:    General: Skin is warm and dry.  Neurological:     Mental Status: He is alert.     Comments: CN II-XII grossly intact. Moving all four extremities spontaneously and sensation grossly intact.     (all labs ordered are listed, but only abnormal results are  displayed) Labs Reviewed - No data to display  EKG: None  Radiology: No results found.   Procedures   Medications Ordered in the ED - No data to display                                  Medical Decision Making Amount and/or Complexity of Data Reviewed Radiology: ordered.    63 year old male with medical history significant for thyroid  disease, seizure disorder, morbid obesity, atrial fibrillation on Eliquis , schizophrenia presenting to the emergency department as a nonlevel trauma after walking into a wall.  The patient states that he was walking and did not notice where he was going when he walks straight into a wall striking the front of his head on the wall.  He denies any loss of consciousness.  He did not fall to the ground or sustain any other injury.  He arrives GCS 15, c-collar in place, ABC intact.  On arrival, the patient was vitally stable.  Physical exam to include trauma exam both primary and secondary survey unremarkable for acute traumatic injury.  Patient with increased risk for intracranial abnormality given his anticoagulation, takes Eliquis , will obtain CT imaging of the head and C-spine and reassess.  CT Head and Cervical Spine: reads pending at time of signout.  Signout given the Dr Mannie at 1500 pending results and reassessment.       Final diagnoses:  Injury of head, initial encounter    ED Discharge Orders     None          Jerrol Agent, MD 01/11/24 [phone]

## 2024-01-11 NOTE — Discharge Instructions (Signed)
 Your CT imaging was negative for fracture or dislocation in the cervical spine and negative for acute intracranial abnormality.

## 2024-01-11 NOTE — ED Triage Notes (Addendum)
 BIB [facility] from [facility] s/p unwitnessed fall hitting head. Takes blood thinners, Eliquis . C-collar placed for precaution. States unsteady and lost balance. A&Ox2. Staff reports pt mental status is at baseline. VSS. Alert, NAD, calm, interactive. R arm tremor noted. CBG 122. Denies pain, usually uses walker. No other recent falls. Last fall was 2 months ago.

## 2024-01-11 NOTE — Addendum Note (Signed)
 Addended by: MADELON DONALD HERO on: 01/11/2024 10:07 AM   Modules accepted: Level of Service

## 2024-01-12 DIAGNOSIS — G40909 Epilepsy, unspecified, not intractable, without status epilepticus: Secondary | ICD-10-CM | POA: Diagnosis not present

## 2024-01-12 DIAGNOSIS — E785 Hyperlipidemia, unspecified: Secondary | ICD-10-CM | POA: Diagnosis not present

## 2024-01-12 DIAGNOSIS — F419 Anxiety disorder, unspecified: Secondary | ICD-10-CM | POA: Diagnosis not present

## 2024-01-12 DIAGNOSIS — Z6830 Body mass index (BMI) 30.0-30.9, adult: Secondary | ICD-10-CM | POA: Diagnosis not present

## 2024-01-12 DIAGNOSIS — R296 Repeated falls: Secondary | ICD-10-CM | POA: Diagnosis not present

## 2024-01-12 DIAGNOSIS — E1169 Type 2 diabetes mellitus with other specified complication: Secondary | ICD-10-CM | POA: Diagnosis not present

## 2024-01-12 DIAGNOSIS — F251 Schizoaffective disorder, depressive type: Secondary | ICD-10-CM | POA: Diagnosis not present

## 2024-01-12 DIAGNOSIS — E039 Hypothyroidism, unspecified: Secondary | ICD-10-CM | POA: Diagnosis not present

## 2024-01-12 DIAGNOSIS — M48061 Spinal stenosis, lumbar region without neurogenic claudication: Secondary | ICD-10-CM | POA: Diagnosis not present

## 2024-01-12 DIAGNOSIS — Z7985 Long-term (current) use of injectable non-insulin antidiabetic drugs: Secondary | ICD-10-CM | POA: Diagnosis not present

## 2024-01-12 DIAGNOSIS — Z9181 History of falling: Secondary | ICD-10-CM | POA: Diagnosis not present

## 2024-01-12 DIAGNOSIS — I4819 Other persistent atrial fibrillation: Secondary | ICD-10-CM | POA: Diagnosis not present

## 2024-01-12 DIAGNOSIS — E669 Obesity, unspecified: Secondary | ICD-10-CM | POA: Diagnosis not present

## 2024-01-12 DIAGNOSIS — Z7901 Long term (current) use of anticoagulants: Secondary | ICD-10-CM | POA: Diagnosis not present

## 2024-01-12 DIAGNOSIS — F32A Depression, unspecified: Secondary | ICD-10-CM | POA: Diagnosis not present

## 2024-01-14 DIAGNOSIS — E785 Hyperlipidemia, unspecified: Secondary | ICD-10-CM | POA: Diagnosis not present

## 2024-01-14 DIAGNOSIS — E669 Obesity, unspecified: Secondary | ICD-10-CM | POA: Diagnosis not present

## 2024-01-14 DIAGNOSIS — E1169 Type 2 diabetes mellitus with other specified complication: Secondary | ICD-10-CM | POA: Diagnosis not present

## 2024-01-14 DIAGNOSIS — E039 Hypothyroidism, unspecified: Secondary | ICD-10-CM | POA: Diagnosis not present

## 2024-01-14 DIAGNOSIS — M48061 Spinal stenosis, lumbar region without neurogenic claudication: Secondary | ICD-10-CM | POA: Diagnosis not present

## 2024-01-14 DIAGNOSIS — G40909 Epilepsy, unspecified, not intractable, without status epilepticus: Secondary | ICD-10-CM | POA: Diagnosis not present

## 2024-01-14 DIAGNOSIS — I4819 Other persistent atrial fibrillation: Secondary | ICD-10-CM | POA: Diagnosis not present

## 2024-01-14 DIAGNOSIS — F32A Depression, unspecified: Secondary | ICD-10-CM | POA: Diagnosis not present

## 2024-01-14 DIAGNOSIS — R296 Repeated falls: Secondary | ICD-10-CM | POA: Diagnosis not present

## 2024-01-14 DIAGNOSIS — F419 Anxiety disorder, unspecified: Secondary | ICD-10-CM | POA: Diagnosis not present

## 2024-01-14 DIAGNOSIS — Z7985 Long-term (current) use of injectable non-insulin antidiabetic drugs: Secondary | ICD-10-CM | POA: Diagnosis not present

## 2024-01-14 DIAGNOSIS — F251 Schizoaffective disorder, depressive type: Secondary | ICD-10-CM | POA: Diagnosis not present

## 2024-01-14 DIAGNOSIS — Z9181 History of falling: Secondary | ICD-10-CM | POA: Diagnosis not present

## 2024-01-14 DIAGNOSIS — Z6830 Body mass index (BMI) 30.0-30.9, adult: Secondary | ICD-10-CM | POA: Diagnosis not present

## 2024-01-14 DIAGNOSIS — Z7901 Long term (current) use of anticoagulants: Secondary | ICD-10-CM | POA: Diagnosis not present

## 2024-01-17 ENCOUNTER — Other Ambulatory Visit: Payer: Self-pay

## 2024-01-17 DIAGNOSIS — F419 Anxiety disorder, unspecified: Secondary | ICD-10-CM

## 2024-01-17 DIAGNOSIS — F339 Major depressive disorder, recurrent, unspecified: Secondary | ICD-10-CM

## 2024-01-17 DIAGNOSIS — R251 Tremor, unspecified: Secondary | ICD-10-CM

## 2024-01-17 MED ORDER — BENZTROPINE MESYLATE 0.5 MG PO TABS
0.5000 mg | ORAL_TABLET | Freq: Two times a day (BID) | ORAL | 2 refills | Status: DC
Start: 2024-01-17 — End: 2024-02-23

## 2024-01-19 ENCOUNTER — Telehealth: Payer: Self-pay | Admitting: Adult Health

## 2024-01-19 DIAGNOSIS — E1169 Type 2 diabetes mellitus with other specified complication: Secondary | ICD-10-CM | POA: Diagnosis not present

## 2024-01-19 DIAGNOSIS — Z6830 Body mass index (BMI) 30.0-30.9, adult: Secondary | ICD-10-CM | POA: Diagnosis not present

## 2024-01-19 DIAGNOSIS — M48061 Spinal stenosis, lumbar region without neurogenic claudication: Secondary | ICD-10-CM | POA: Diagnosis not present

## 2024-01-19 DIAGNOSIS — E785 Hyperlipidemia, unspecified: Secondary | ICD-10-CM | POA: Diagnosis not present

## 2024-01-19 DIAGNOSIS — Z7985 Long-term (current) use of injectable non-insulin antidiabetic drugs: Secondary | ICD-10-CM | POA: Diagnosis not present

## 2024-01-19 DIAGNOSIS — R296 Repeated falls: Secondary | ICD-10-CM | POA: Diagnosis not present

## 2024-01-19 DIAGNOSIS — F419 Anxiety disorder, unspecified: Secondary | ICD-10-CM | POA: Diagnosis not present

## 2024-01-19 DIAGNOSIS — E039 Hypothyroidism, unspecified: Secondary | ICD-10-CM | POA: Diagnosis not present

## 2024-01-19 DIAGNOSIS — Z9181 History of falling: Secondary | ICD-10-CM | POA: Diagnosis not present

## 2024-01-19 DIAGNOSIS — E669 Obesity, unspecified: Secondary | ICD-10-CM | POA: Diagnosis not present

## 2024-01-19 DIAGNOSIS — I4819 Other persistent atrial fibrillation: Secondary | ICD-10-CM | POA: Diagnosis not present

## 2024-01-19 DIAGNOSIS — G40909 Epilepsy, unspecified, not intractable, without status epilepticus: Secondary | ICD-10-CM | POA: Diagnosis not present

## 2024-01-19 DIAGNOSIS — Z7901 Long term (current) use of anticoagulants: Secondary | ICD-10-CM | POA: Diagnosis not present

## 2024-01-19 DIAGNOSIS — F32A Depression, unspecified: Secondary | ICD-10-CM | POA: Diagnosis not present

## 2024-01-19 DIAGNOSIS — F251 Schizoaffective disorder, depressive type: Secondary | ICD-10-CM | POA: Diagnosis not present

## 2024-01-19 NOTE — Telephone Encounter (Signed)
 [name] has reached out to him multiple times to schedule the dat scan with no response: 01/18/24 lvm tdc  01/10/24 lvm tdc  01/03/24 lvm tdc  6/30 no auth required per [employer] and [facility]

## 2024-01-21 ENCOUNTER — Emergency Department (HOSPITAL_COMMUNITY)

## 2024-01-21 ENCOUNTER — Emergency Department (HOSPITAL_COMMUNITY)
Admission: EM | Admit: 2024-01-21 | Discharge: 2024-01-21 | Disposition: A | Discharge status: Home / Self Care | Attending: Emergency Medicine | Admitting: Emergency Medicine

## 2024-01-21 DIAGNOSIS — I10 Essential (primary) hypertension: Secondary | ICD-10-CM | POA: Diagnosis not present

## 2024-01-21 DIAGNOSIS — M8580 Other specified disorders of bone density and structure, unspecified site: Secondary | ICD-10-CM | POA: Diagnosis not present

## 2024-01-21 DIAGNOSIS — R9431 Abnormal electrocardiogram [ECG] [EKG]: Secondary | ICD-10-CM | POA: Diagnosis not present

## 2024-01-21 DIAGNOSIS — Z79899 Other long term (current) drug therapy: Secondary | ICD-10-CM | POA: Diagnosis not present

## 2024-01-21 DIAGNOSIS — Z981 Arthrodesis status: Secondary | ICD-10-CM | POA: Diagnosis not present

## 2024-01-21 DIAGNOSIS — M4802 Spinal stenosis, cervical region: Secondary | ICD-10-CM | POA: Diagnosis not present

## 2024-01-21 DIAGNOSIS — S0990XA Unspecified injury of head, initial encounter: Secondary | ICD-10-CM | POA: Insufficient documentation

## 2024-01-21 DIAGNOSIS — Z7901 Long term (current) use of anticoagulants: Secondary | ICD-10-CM | POA: Insufficient documentation

## 2024-01-21 DIAGNOSIS — R5383 Other fatigue: Secondary | ICD-10-CM | POA: Diagnosis not present

## 2024-01-21 DIAGNOSIS — Z794 Long term (current) use of insulin: Secondary | ICD-10-CM | POA: Insufficient documentation

## 2024-01-21 DIAGNOSIS — M545 Low back pain, unspecified: Secondary | ICD-10-CM | POA: Diagnosis not present

## 2024-01-21 DIAGNOSIS — Z743 Need for continuous supervision: Secondary | ICD-10-CM | POA: Diagnosis not present

## 2024-01-21 DIAGNOSIS — Z7401 Bed confinement status: Secondary | ICD-10-CM | POA: Diagnosis not present

## 2024-01-21 DIAGNOSIS — W19XXXA Unspecified fall, initial encounter: Secondary | ICD-10-CM | POA: Insufficient documentation

## 2024-01-21 DIAGNOSIS — I4891 Unspecified atrial fibrillation: Secondary | ICD-10-CM | POA: Diagnosis not present

## 2024-01-21 DIAGNOSIS — S199XXA Unspecified injury of neck, initial encounter: Secondary | ICD-10-CM | POA: Diagnosis not present

## 2024-01-21 DIAGNOSIS — R9082 White matter disease, unspecified: Secondary | ICD-10-CM | POA: Diagnosis not present

## 2024-01-21 LAB — CBC
HCT: 40.6 % (ref 39.0–52.0)
Hemoglobin: 13.1 g/dL (ref 13.0–17.0)
MCH: 29 pg (ref 26.0–34.0)
MCHC: 32.3 g/dL (ref 30.0–36.0)
MCV: 89.8 fL (ref 80.0–100.0)
Platelets: 164 K/uL (ref 150–400)
RBC: 4.52 MIL/uL (ref 4.22–5.81)
RDW: 15.9 % — ABNORMAL HIGH (ref 11.5–15.5)
WBC: 6.9 K/uL (ref 4.0–10.5)
nRBC: 0 % (ref 0.0–0.2)

## 2024-01-21 LAB — COMPREHENSIVE METABOLIC PANEL WITH GFR
ALT: 7 U/L (ref 0–44)
AST: 18 U/L (ref 15–41)
Albumin: 3.3 g/dL — ABNORMAL LOW (ref 3.5–5.0)
Alkaline Phosphatase: 65 U/L (ref 38–126)
Anion gap: 11 (ref 5–15)
BUN: 8 mg/dL (ref 8–23)
CO2: 23 mmol/L (ref 22–32)
Calcium: 9.1 mg/dL (ref 8.9–10.3)
Chloride: 99 mmol/L (ref 98–111)
Creatinine, Ser: 0.76 mg/dL (ref 0.61–1.24)
GFR, Estimated: >60 mL/min (ref >60)
Glucose, Bld: 83 mg/dL (ref 70–99)
Potassium: 4.2 mmol/L (ref 3.5–5.1)
Sodium: 133 mmol/L — ABNORMAL LOW (ref 135–145)
Total Bilirubin: 1.2 mg/dL (ref 0.0–1.2)
Total Protein: 5.9 g/dL — ABNORMAL LOW (ref 6.5–8.1)

## 2024-01-21 LAB — PROTIME-INR
INR: 1.4 — ABNORMAL HIGH (ref 0.8–1.2)
Prothrombin Time: 17.7 s — ABNORMAL HIGH (ref 11.4–15.2)

## 2024-01-21 LAB — ETHANOL: Alcohol, Ethyl (B): <15 mg/dL (ref <15)

## 2024-01-21 LAB — I-STAT CG4 LACTIC ACID, ED: Lactic Acid, Venous: 1 mmol/L (ref 0.5–1.9)

## 2024-01-21 NOTE — Discharge Instructions (Signed)
 As discussed, your evaluation today has been largely reassuring.  But, it is important that you monitor your condition carefully, and do not hesitate to return to the ED if you develop new, or concerning changes in your condition. ? ?Otherwise, please follow-up with your physician for appropriate ongoing care. ? ?

## 2024-01-21 NOTE — ED Notes (Signed)
 CCMD called and pt placed on monitor

## 2024-01-21 NOTE — ED Provider Notes (Signed)
 [place] EMERGENCY DEPARTMENT AT [facility] Provider Note   CSN: [id number] Arrival date & time: 01/21/24  9286     Patient presents with: No chief complaint on file.   [name] is a 63 y.o. male.   HPI Patient presents from assisted living facility after possible fall.  Unclear exact characteristics of the fall, and when it may have occurred.  Patient tells injury to his head, and states that he fell 2 days ago.  Per report concern for head trauma prompted EMS notification.  EMS reports staff noted that the patient is more confused than normal though he has some baseline confusion.  Patient denies focal pain, focal weakness, is aware of who he is, where he is.    Prior to Admission medications   Medication Sig Start Date End Date Taking? Authorizing Provider  allopurinol  (ZYLOPRIM ) 300 MG tablet TAKE 1 TABLET BY MOUTH EVERY DAY 10/01/22  Yes Malvina Ellen, MD  apixaban  (ELIQUIS ) 5 MG TABS tablet Take 1 tablet (5 mg total) by mouth 2 (two) times daily. 10/19/22  Yes McDiarmid, Krystal BIRCH, MD  atorvastatin  (LIPITOR) 40 MG tablet TAKE 1 TABLET BY MOUTH EVERY DAY Patient taking differently: Take 40 mg by mouth at bedtime. 04/15/22  Yes Lilland, Alana, DO  benztropine  (COGENTIN ) 0.5 MG tablet Take 1 tablet (0.5 mg total) by mouth 2 (two) times daily. 01/17/24  Yes Howell Lunger, DO  divalproex  (DEPAKOTE  ER) 250 MG 24 hr tablet Take 1 tablet (250 mg total) by mouth every morning AND 2 tablets (500 mg total) at bedtime. Patient taking differently: Give 1 tablet (250mg ) by mouth every morning. 12/28/23  Yes McCue, Harlene, NP  divalproex  (DEPAKOTE ) 500 MG DR tablet Take 500 mg by mouth at bedtime.   Yes [provider]  levothyroxine  (SYNTHROID ) 125 MCG tablet Take 1 tablet (125 mcg total) by mouth daily. 04/16/23  Yes Dameron, Marisa, DO  omeprazole  (PRILOSEC) 40 MG capsule Take 1 capsule (40 mg total) by mouth daily. 07/13/23  Yes Howell Lunger, DO  perphenazine   (TRILAFON ) 8 MG tablet Take 3 tablets (24 mg total) by mouth at bedtime. 12/02/23  Yes Arfeen, Leni DASEN, MD  phenelzine  (NARDIL ) 15 MG tablet Take 3 tablets (45 mg total) by mouth 2 (two) times daily. 12/02/23  Yes Arfeen, Leni DASEN, MD  polyethylene glycol (MIRALAX  / GLYCOLAX ) 17 g packet Take 34 g by mouth daily. Patient taking differently: Take 17 g by mouth every 12 (twelve) hours as needed (constipation). 04/16/23  Yes Dameron, Barabara, DO  Semaglutide , 1 MG/DOSE, (OZEMPIC , 1 MG/DOSE,) 4 MG/3ML SOPN Inject 1 mg into the skin every Wednesday.   Yes [provider]  senna-docusate (SENOKOT-S) 8.6-50 MG tablet Take 1 tablet by mouth at bedtime. 04/16/23  Yes Dameron, Marisa, DO    Allergies: Glucophage  [metformin ] and Lamictal  [lamotrigine ]    Review of Systems  Updated Vital Signs BP 124/80   Pulse 80   Temp (!) 97.4 F (36.3 C) (Oral)   Resp 15   SpO2 98%   Physical Exam Vitals and nursing note reviewed.  Constitutional:      General: He is not in acute distress.    Appearance: He is well-developed.     Comments: No distress  HENT:     Head: Normocephalic and atraumatic.  Eyes:     Conjunctiva/sclera: Conjunctivae normal.  Neck:     Comments: Cervical collar partially in place Cardiovascular:     Rate and Rhythm: Normal rate and regular  rhythm.  Pulmonary:     Effort: Pulmonary effort is normal. No respiratory distress.     Breath sounds: No stridor.  Abdominal:     General: There is no distension.  Skin:    General: Skin is warm and dry.  Neurological:     Mental Status: He is alert and oriented to person, place, and time.     Comments: Patient oriented to self, place, roughly to date.  He follows commands, though with repetitive responses rather than quick adjustment to neuro commands.  He does move all EXTR spontaneously has no facial asymmetry, and speech is clear.  Psychiatric:        Behavior: Behavior normal.     (all labs ordered are listed, but only  abnormal results are displayed) Labs Reviewed  COMPREHENSIVE METABOLIC PANEL WITH GFR - Abnormal; Notable for the following components:      Result Value   Sodium 133 (*)    Total Protein 5.9 (*)    Albumin  3.3 (*)    All other components within normal limits  CBC - Abnormal; Notable for the following components:   RDW 15.9 (*)    All other components within normal limits  PROTIME-INR - Abnormal; Notable for the following components:   Prothrombin Time 17.7 (*)    INR 1.4 (*)    All other components within normal limits  ETHANOL  URINALYSIS, ROUTINE W REFLEX MICROSCOPIC  I-STAT CG4 LACTIC ACID, ED    EKG: EKG Interpretation Date/Time:  Friday January 21 2024 07:21:23 EDT Ventricular Rate:  81 PR Interval:    QRS Duration:  132 QT Interval:  395 QTC Calculation: 459 R Axis:   65  Text Interpretation: Atrial fibrillation Right bundle branch block Confirmed by Garrick Charleston [phone]) on 01/21/2024 7:36:19 AM  Radiology: CT CERVICAL SPINE WO CONTRAST Result Date: 01/21/2024 CLINICAL DATA:  Polytrauma, blunt EXAM: CT CERVICAL SPINE WITHOUT CONTRAST TECHNIQUE: Multidetector CT imaging of the cervical spine was performed without intravenous contrast. Multiplanar CT image reconstructions were also generated. RADIATION DOSE REDUCTION: This exam was performed according to the departmental dose-optimization program which includes automated exposure control, adjustment of the mA and/or kV according to patient size and/or use of iterative reconstruction technique. COMPARISON:  CT of the cervical spine dated January 11, 2024. FINDINGS: Alignment: Normal. Skull base and vertebrae: Vertebral bodies are intact. There is diffuse idiopathic skeletal hyperostosis and patient is status post anterior spinal fusion at C4-5, C5-6 and C6-7. There are anterior bridging osteophytes extending from C3 through T3. There is also ossification of the posterior longitudinal ligament at C2-3 and C3-4, which is causing mild  to moderate central spinal canal stenosis at these levels. There is no evidence of fracture or osseous lesion. Soft tissues and spinal canal: No paraspinous hematoma or soft tissue injury evident. Disc levels: There is mild-to-moderate central spinal canal stenosis at C2-3, C3-4, C5-6 and C6-7 secondary to combination of ossification of the posterior longitudinal ligament and endplate ridging. Upper chest: Are ground-glass and reticular opacities present dependently within the upper lobes bilaterally. Other: None. IMPRESSION: 1. Stable postoperative changes and rigid spine secondary to diffuse idiopathic skeletal hyperostosis. No evidence of acute traumatic injury. Electronically Signed   By: Evalene Coho M.D.   On: 01/21/2024 08:13   CT HEAD WO CONTRAST Result Date: 01/21/2024 CLINICAL DATA:  Head trauma, moderate-severe EXAM: CT HEAD WITHOUT CONTRAST TECHNIQUE: Contiguous axial images were obtained from the base of the skull through the vertex without intravenous contrast. RADIATION DOSE  REDUCTION: This exam was performed according to the departmental dose-optimization program which includes automated exposure control, adjustment of the mA and/or kV according to patient size and/or use of iterative reconstruction technique. COMPARISON:  CT of the head dated January 11, 2024. FINDINGS: Brain: Age-related atrophy and moderate periventricular and deep cerebral white matter disease. No evidence of hemorrhage, mass, acute cortical infarct or hydrocephalus. Vascular: Negative. Skull: Intact and unremarkable. Sinuses/Orbits: Clear paranasal sinuses and mastoid air cells. Status post bilateral lens replacement. The orbits are unremarkable otherwise. Other: None. IMPRESSION: 1. Age-related atrophy and moderate cerebral white matter disease. No evidence of acute intracranial injury. Electronically Signed   By: Evalene Coho M.D.   On: 01/21/2024 08:06     Procedures   Medications Ordered in the ED - No data to  display                                  Medical Decision Making Adult male on Eliquis  for history of A-fib presents after possible fall with possible head trauma.  With unclear circumstances some reported confusion, and slow to respond appropriately to neuro commands concern for intracranial abnormality, versus TBI versus concussion.  Patient is overtly neuro intact which is reassuring.  Cardiac 65 sinus normal pulse ox 100% room air normal  Amount and/or Complexity of Data Reviewed Independent Historian: EMS External Data Reviewed: notes. Labs: ordered. Decision-making details documented in ED Course. Radiology: ordered and independent interpretation performed. Decision-making details documented in ED Course. ECG/medicine tests: ordered and independent interpretation performed. Decision-making details documented in ED Course.  Risk Prescription drug management. Decision regarding hospitalization. Diagnosis or treatment significantly limited by social determinants of health.   12:10 PM Patient in no distress, awake, alert, vitals unremarkable, labs CT x-ray reviewed, all reassuring, no evidence for posttraumatic consequences.  Patient appropriate for discharge with outpatient follow-up.     Final diagnoses:  Fall, initial encounter    ED Discharge Orders     None          Garrick Charleston, MD 01/21/24 1210

## 2024-01-21 NOTE — ED Notes (Signed)
 PTAR has been scheduled for the patient to return to [facility] [place]. ETA is 1.5 hours.

## 2024-01-21 NOTE — ED Triage Notes (Signed)
 BIB [facility] from [facility], unwitnessed fall unsure of hitting his head, On blood thinners, Patient had stated that he slid out of bed and hit his head then stated he didn't. Baseline is A/Ox2, staff stated he is normally slow to respond but today he is more confused then normal.   67 HR 93% RA 89 cbg

## 2024-01-22 ENCOUNTER — Emergency Department (HOSPITAL_COMMUNITY)

## 2024-01-22 ENCOUNTER — Other Ambulatory Visit: Payer: Self-pay

## 2024-01-22 ENCOUNTER — Emergency Department (HOSPITAL_COMMUNITY)
Admission: EM | Admit: 2024-01-22 | Discharge: 2024-01-22 | Disposition: A | Discharge status: Home / Self Care | Attending: Emergency Medicine | Admitting: Emergency Medicine

## 2024-01-22 DIAGNOSIS — Z7901 Long term (current) use of anticoagulants: Secondary | ICD-10-CM | POA: Insufficient documentation

## 2024-01-22 DIAGNOSIS — R140 Abdominal distension (gaseous): Secondary | ICD-10-CM | POA: Diagnosis not present

## 2024-01-22 DIAGNOSIS — R000 Tachycardia, unspecified: Secondary | ICD-10-CM | POA: Diagnosis not present

## 2024-01-22 DIAGNOSIS — S5012XA Contusion of left forearm, initial encounter: Secondary | ICD-10-CM | POA: Insufficient documentation

## 2024-01-22 DIAGNOSIS — Y92129 Unspecified place in nursing home as the place of occurrence of the external cause: Secondary | ICD-10-CM | POA: Diagnosis not present

## 2024-01-22 DIAGNOSIS — Z794 Long term (current) use of insulin: Secondary | ICD-10-CM | POA: Insufficient documentation

## 2024-01-22 DIAGNOSIS — R531 Weakness: Secondary | ICD-10-CM | POA: Diagnosis not present

## 2024-01-22 DIAGNOSIS — R0781 Pleurodynia: Secondary | ICD-10-CM | POA: Insufficient documentation

## 2024-01-22 DIAGNOSIS — W19XXXA Unspecified fall, initial encounter: Secondary | ICD-10-CM | POA: Diagnosis not present

## 2024-01-22 DIAGNOSIS — Z743 Need for continuous supervision: Secondary | ICD-10-CM | POA: Diagnosis not present

## 2024-01-22 DIAGNOSIS — M542 Cervicalgia: Secondary | ICD-10-CM | POA: Diagnosis not present

## 2024-01-22 DIAGNOSIS — S59912A Unspecified injury of left forearm, initial encounter: Secondary | ICD-10-CM | POA: Diagnosis not present

## 2024-01-22 DIAGNOSIS — R0989 Other specified symptoms and signs involving the circulatory and respiratory systems: Secondary | ICD-10-CM | POA: Diagnosis not present

## 2024-01-22 DIAGNOSIS — Z7401 Bed confinement status: Secondary | ICD-10-CM | POA: Diagnosis not present

## 2024-01-22 DIAGNOSIS — S0990XA Unspecified injury of head, initial encounter: Secondary | ICD-10-CM | POA: Diagnosis not present

## 2024-01-22 DIAGNOSIS — M4802 Spinal stenosis, cervical region: Secondary | ICD-10-CM | POA: Diagnosis not present

## 2024-01-22 DIAGNOSIS — I4891 Unspecified atrial fibrillation: Secondary | ICD-10-CM | POA: Diagnosis not present

## 2024-01-22 DIAGNOSIS — Z981 Arthrodesis status: Secondary | ICD-10-CM | POA: Diagnosis not present

## 2024-01-22 DIAGNOSIS — M8580 Other specified disorders of bone density and structure, unspecified site: Secondary | ICD-10-CM | POA: Diagnosis not present

## 2024-01-22 DIAGNOSIS — M545 Low back pain, unspecified: Secondary | ICD-10-CM | POA: Diagnosis not present

## 2024-01-22 LAB — CBC
HCT: 41.9 % (ref 39.0–52.0)
Hemoglobin: 13.7 g/dL (ref 13.0–17.0)
MCH: 29.7 pg (ref 26.0–34.0)
MCHC: 32.7 g/dL (ref 30.0–36.0)
MCV: 90.7 fL (ref 80.0–100.0)
Platelets: 166 K/uL (ref 150–400)
RBC: 4.62 MIL/uL (ref 4.22–5.81)
RDW: 16 % — ABNORMAL HIGH (ref 11.5–15.5)
WBC: 8.8 K/uL (ref 4.0–10.5)
nRBC: 0 % (ref 0.0–0.2)

## 2024-01-22 LAB — I-STAT CHEM 8, ED
BUN: 11 mg/dL (ref 8–23)
Calcium, Ion: 1.06 mmol/L — ABNORMAL LOW (ref 1.15–1.40)
Chloride: 99 mmol/L (ref 98–111)
Creatinine, Ser: 0.9 mg/dL (ref 0.61–1.24)
Glucose, Bld: 94 mg/dL (ref 70–99)
HCT: 44 % (ref 39.0–52.0)
Hemoglobin: 15 g/dL (ref 13.0–17.0)
Potassium: 3.4 mmol/L — ABNORMAL LOW (ref 3.5–5.1)
Sodium: 133 mmol/L — ABNORMAL LOW (ref 135–145)
TCO2: 23 mmol/L (ref 22–32)

## 2024-01-22 LAB — COMPREHENSIVE METABOLIC PANEL WITH GFR
ALT: 6 U/L (ref 0–44)
AST: 18 U/L (ref 15–41)
Albumin: 3.8 g/dL (ref 3.5–5.0)
Alkaline Phosphatase: 84 U/L (ref 38–126)
Anion gap: 14 (ref 5–15)
BUN: 10 mg/dL (ref 8–23)
CO2: 23 mmol/L (ref 22–32)
Calcium: 9.2 mg/dL (ref 8.9–10.3)
Chloride: 97 mmol/L — ABNORMAL LOW (ref 98–111)
Creatinine, Ser: 0.97 mg/dL (ref 0.61–1.24)
GFR, Estimated: >60 mL/min (ref >60)
Glucose, Bld: 101 mg/dL — ABNORMAL HIGH (ref 70–99)
Potassium: 3.5 mmol/L (ref 3.5–5.1)
Sodium: 134 mmol/L — ABNORMAL LOW (ref 135–145)
Total Bilirubin: 1.2 mg/dL (ref 0.0–1.2)
Total Protein: 6.8 g/dL (ref 6.5–8.1)

## 2024-01-22 LAB — I-STAT CG4 LACTIC ACID, ED: Lactic Acid, Venous: 1.7 mmol/L (ref 0.5–1.9)

## 2024-01-22 LAB — SAMPLE TO BLOOD BANK

## 2024-01-22 MED ORDER — LIDOCAINE 5 % EX PTCH: 1.0000 | MEDICATED_PATCH | CUTANEOUS | Status: DC

## 2024-01-22 NOTE — Discharge Instructions (Signed)
 Today you were seen for a fall.  You may take Tylenol  as needed for pain.  Please follow-up with your PCP if your symptoms persist for further evaluation and workup.  Thank you for letting us  treat you today. After reviewing your labs and imaging, I feel you are safe to go home. Please follow up with your PCP in the next several days and provide them with your records from this visit. Return to the Emergency Room if pain becomes severe or symptoms worsen.

## 2024-01-22 NOTE — ED Notes (Signed)
 Patient transported to CT scan .

## 2024-01-22 NOTE — ED Notes (Signed)
 Secretary notified PT[facility] for transport back to [facility] .  Attempted multiple times to call [facility] by RN but nobody is answering .

## 2024-01-22 NOTE — ED Notes (Signed)
 Ptar called for transport to [facility]

## 2024-01-22 NOTE — ED Triage Notes (Addendum)
 Patient arrived with EMS from [facility] , level 2 activation , unwitnessed fall , found on the floor by staff this morning , takes Eliquis  , patient unable to recall incident . C- collar applied at arrival. CBG=115.

## 2024-01-22 NOTE — ED Notes (Signed)
 Multiple calls made to NH to give report with no answer. Pt's mother called and advised being was being transported back to facility.

## 2024-01-22 NOTE — ED Notes (Signed)
 Trauma Response Nurse Documentation   [name] is a 63 y.o. male arriving to [facility] ED via [facility]  On Eliquis  (apixaban ) daily. Trauma was activated as a Level 2 by Almarie Naomi PEAK based on the following trauma criteria Elderly patients > 65 with head trauma on anti-coagulation (excluding ASA).  Patient cleared for CT by Dr. Jerral. Pt transported to CT with trauma response nurse present to monitor. RN remained with the patient throughout their absence from the department for clinical observation.   GCS 14.  Trauma MD Arrival Time: N/A.  History   Past Medical History:  Diagnosis Date   A-fib (HCC)    Acute right-sided low back pain    Anxiety    Arrhythmia    Barrett esophagus    Chronic a-fib (HCC)    Closed fracture of left distal radius    Contact dermatitis 04/16/2017   Depression    Depression    Hypothyroidism    Low back pain    Morbid obesity, BMI unknown (HCC)    Obesity    Osteoarthritis    oa in bilateral knees   Schizophrenia (HCC)    Seizures (HCC)    Seizures (HCC)    Thyroid  disease    Varicose veins      Past Surgical History:  Procedure Laterality Date   ACHILLES TENDON REPAIR  approx. 2004   right foot   ANTERIOR CERVICAL DECOMP/DISCECTOMY FUSION N/A 04/08/2023   Procedure: Anterior Cervical Discectomy Fusion Cervical Five-Cervical Six;  Surgeon: Lanis Pupa, MD;  Location: [facility] OR;  Service: Neurosurgery;  Laterality: N/A;   CATARACT EXTRACTION     bilateral   OPEN REDUCTION INTERNAL FIXATION (ORIF) DISTAL RADIAL FRACTURE Left 07/21/2018   Procedure: OPEN REDUCTION INTERNAL FIXATION (ORIF) DISTAL RADIAL FRACTURE;  Surgeon: Murrell Drivers, MD;  Location: [facility];  Service: Orthopedics;  Laterality: Left;   stab phlebectomy  Right 11/09/2016   stab phlebectomy R leg by Lynwood Collum MD   TOOTH EXTRACTION         Initial Focused Assessment (If applicable, or please see trauma  documentation): Airway-- intact, no visible obstruction Breathing-- spontaneous, unlabored Circulation-- no obvious bleeding noted  CT's Completed:   CT Head and CT C-Spine   Interventions:  See event summary  Plan for disposition:  None at this time.  Consults completed:  none at [phone].  Event Summary: Patient brought in by [facility] from [place]. Patient had an unwitnessed fall tonight, reports he fell outside of his room, but staff found him inside his room. Baseline A&Ox2. On arrival, patient transferred from EMS stretcher to hospital stretcher. Manual BP obtained. 20 G PIV R wrist established. Trauma labs obtained. Xray chest and pelvis completed. Patient log-rolled by team. Patient to CT with TRN. CT head and c-spine completed. Patient back to trauma bay at this time.  MTP Summary (If applicable):  N/A  Bedside handoff with ED RN Beryl.    Bernardino Mayotte  Trauma Response RN  Please call TRN at [phone] for further assistance.

## 2024-01-22 NOTE — ED Notes (Signed)
 [facility] not answering when this RN called to give report

## 2024-01-22 NOTE — ED Provider Notes (Signed)
 [facility] Provider Note   CSN: [id number] Arrival date & time: 01/22/24  9471     Patient presents with: Level 2 / Fall on Eliquis    [name] is a 63 y.o. male presents today after a unwitnessed fall at [facility].  Patient was found on the floor this morning by staff.  Patient unable to recall incident.  Patient on Eliquis .  Patient reports right sided rib pain, denies pain anywhere else.  Patient oriented to self only.   HPI     Prior to Admission medications   Medication Sig Start Date End Date Taking? Authorizing Provider  allopurinol  (ZYLOPRIM ) 300 MG tablet TAKE 1 TABLET BY MOUTH EVERY DAY 10/01/22   Malvina Ellen, MD  apixaban  (ELIQUIS ) 5 MG TABS tablet Take 1 tablet (5 mg total) by mouth 2 (two) times daily. 10/19/22   McDiarmid, Krystal BIRCH, MD  atorvastatin  (LIPITOR) 40 MG tablet TAKE 1 TABLET BY MOUTH EVERY DAY Patient taking differently: Take 40 mg by mouth at bedtime. 04/15/22   Lilland, Alana, DO  benztropine  (COGENTIN ) 0.5 MG tablet Take 1 tablet (0.5 mg total) by mouth 2 (two) times daily. 01/17/24   Howell Lunger, DO  divalproex  (DEPAKOTE  ER) 250 MG 24 hr tablet Take 1 tablet (250 mg total) by mouth every morning AND 2 tablets (500 mg total) at bedtime. Patient taking differently: Give 1 tablet (250mg ) by mouth every morning. 12/28/23   Whitfield Raisin, NP  divalproex  (DEPAKOTE ) 500 MG DR tablet Take 500 mg by mouth at bedtime.    [provider]  levothyroxine  (SYNTHROID ) 125 MCG tablet Take 1 tablet (125 mcg total) by mouth daily. 04/16/23   Dameron, Marisa, DO  omeprazole  (PRILOSEC) 40 MG capsule Take 1 capsule (40 mg total) by mouth daily. 07/13/23   Howell Lunger, DO  perphenazine  (TRILAFON ) 8 MG tablet Take 3 tablets (24 mg total) by mouth at bedtime. 12/02/23   Arfeen, Leni DASEN, MD  phenelzine  (NARDIL ) 15 MG tablet Take 3 tablets (45 mg total) by mouth 2 (two) times daily. 12/02/23   Arfeen, Leni DASEN, MD   polyethylene glycol (MIRALAX  / GLYCOLAX ) 17 g packet Take 34 g by mouth daily. Patient taking differently: Take 17 g by mouth every 12 (twelve) hours as needed (constipation). 04/16/23   Dameron, Barabara, DO  Semaglutide , 1 MG/DOSE, (OZEMPIC , 1 MG/DOSE,) 4 MG/3ML SOPN Inject 1 mg into the skin every Wednesday.    [provider]  senna-docusate (SENOKOT-S) 8.6-50 MG tablet Take 1 tablet by mouth at bedtime. 04/16/23   Dameron, Marisa, DO    Allergies: Glucophage  [metformin ] and Lamictal  [lamotrigine ]    Review of Systems  Musculoskeletal:  Positive for arthralgias.    Updated Vital Signs BP 127/75   Pulse (!) 55   Temp 98.6 F (37 C) (Temporal)   Resp 14   SpO2 94%   Physical Exam Vitals and nursing note reviewed.  Constitutional:      General: He is not in acute distress.    Appearance: Normal appearance. He is well-developed.  HENT:     Head: Normocephalic and atraumatic.     Right Ear: External ear normal.     Left Ear: External ear normal.     Nose: Nose normal.     Mouth/Throat:     Mouth: Mucous membranes are moist.     Pharynx: Oropharynx is clear.  Eyes:     Extraocular Movements: Extraocular movements intact.     Conjunctiva/sclera:  Conjunctivae normal.  Cardiovascular:     Rate and Rhythm: Normal rate and regular rhythm.     Pulses: Normal pulses.     Heart sounds: Normal heart sounds. No murmur heard. Pulmonary:     Effort: Pulmonary effort is normal. No respiratory distress.     Breath sounds: Normal breath sounds.  Abdominal:     Palpations: Abdomen is soft.     Tenderness: There is no abdominal tenderness.  Musculoskeletal:        General: No swelling.     Cervical back: Neck supple.     Comments: Patient with mild tenderness to palpation of the right anterior ribs.  Skin:    General: Skin is warm and dry.     Capillary Refill: Capillary refill takes less than 2 seconds.     Findings: Bruising present.     Comments: Patient with small  abrasions and ecchymosis noted to his left forearm from previous falls earlier this week.  Neurological:     Mental Status: He is alert.  Psychiatric:        Mood and Affect: Mood normal.     (all labs ordered are listed, but only abnormal results are displayed) Labs Reviewed  CBC - Abnormal; Notable for the following components:      Result Value   RDW 16.0 (*)    All other components within normal limits  I-STAT CHEM 8, ED - Abnormal; Notable for the following components:   Sodium 133 (*)    Potassium 3.4 (*)    Calcium , Ion 1.06 (*)    All other components within normal limits  COMPREHENSIVE METABOLIC PANEL WITH GFR  I-STAT CG4 LACTIC ACID, ED  SAMPLE TO BLOOD BANK    EKG: None  Radiology: CT CERVICAL SPINE WO CONTRAST Result Date: 01/22/2024 CLINICAL DATA:  63 year old male status post unwitnessed fall, found down. On Eliquis . EXAM: CT CERVICAL SPINE WITHOUT CONTRAST TECHNIQUE: Multidetector CT imaging of the cervical spine was performed without intravenous contrast. Multiplanar CT image reconstructions were also generated. RADIATION DOSE REDUCTION: This exam was performed according to the departmental dose-optimization program which includes automated exposure control, adjustment of the mA and/or kV according to patient size and/or use of iterative reconstruction technique. COMPARISON:  Head CT today.  Cervical spine CT yesterday. FINDINGS: Alignment: Stable cervical lordosis. Ankylosed cervicothoracic junction. Bilateral posterior element alignment is within normal limits. Skull base and vertebrae: Visualized skull base is intact. No atlanto-occipital dissociation. C1 and C2 are chronically degenerated but appear intact and aligned. Postoperative details are below. No acute osseous abnormality identified. Soft tissues and spinal canal: No prevertebral fluid or swelling. No visible canal hematoma. Negative noncontrast visible neck soft tissues. 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 Ankylosis/arthrodesis from C3 through the cervicothoracic junction. Bulky osteophytes and OPLL at C2-C3 without ankylosis. C2-C3 and C3-C4 spinal stenosis which appears stable. Upper chest: Stable and negative visible noncontrast thoracic inlet. IMPRESSION: 1. No acute traumatic injury identified in the cervical spine. 2. 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 Superimposed C2 and C3 level OPLL with spinal stenosis. Electronically Signed   By: VEAR Hurst M.D.   On: 01/22/2024 06:06   CT HEAD WO CONTRAST Result Date: 01/22/2024 CLINICAL DATA:  63 year old male status post unwitnessed fall, found down. On Eliquis . EXAM: CT HEAD WITHOUT CONTRAST TECHNIQUE: Contiguous axial  images were obtained from the base of the skull through the vertex without intravenous contrast. RADIATION DOSE REDUCTION: This exam was performed according to the departmental dose-optimization program which includes automated exposure control, adjustment of the mA and/or kV according to patient size and/or use of iterative reconstruction technique. COMPARISON:  Brain MRI 12/22/2023.  Head CT yesterday 01/21/2024. FINDINGS: Brain: No midline shift, ventriculomegaly, mass effect, evidence of mass lesion, intracranial hemorrhage or evidence of cortically based acute infarction. Stable cerebral volume. Stable gray-white matter differentiation throughout the brain. Patchy bilateral white matter changes stable from MRI last month. Vascular: No suspicious intracranial vascular hyperdensity. Skull: Stable and intact. Sinuses/Orbits: Visualized paranasal sinuses and mastoids are stable and well aerated. Other: Orbit and scalp soft tissues stable from yesterday. No new soft tissue injury identified. IMPRESSION: Stable from  yesterday. No acute intracranial abnormality or acute traumatic injury identified. Electronically Signed   By: VEAR Hurst M.D.   On: 01/22/2024 06:03   DG Pelvis Portable Result Date: 01/22/2024 CLINICAL DATA:  63 year old male status post unwitnessed fall, found down. On Eliquis . EXAM: PORTABLE PELVIS 1-2 VIEWS COMPARISON:  CT Abdomen and Pelvis 03/24/2015. Pelvis radiograph 08/13/2021. FINDINGS: Portable AP supine view at 0535 hours. Bone mineralization is within normal limits. Femoral heads normally located. Pelvis appears intact. Symphysis and SI joints are stable. Grossly stable and intact proximal femurs. Normal visible bowel gas pattern. IMPRESSION: No acute fracture or dislocation identified about the pelvis. Electronically Signed   By: VEAR Hurst M.D.   On: 01/22/2024 05:49   DG Chest Port 1 View Result Date: 01/22/2024 CLINICAL DATA:  63 year old male status post unwitnessed fall, found down. On Eliquis . EXAM: PORTABLE CHEST 1 VIEW COMPARISON:  Chest radiograph 12/21/2023 and earlier. FINDINGS: Portable AP supine view at 0535 hours. C-collar artifact. Prior cervical ACDF. Lower lung volumes. Stable cardiac size and mediastinal contours. Allowing for portable technique the lungs are clear. No pneumothorax or pleural effusion evident on this supine view. Paucity of bowel gas. Stable visualized osseous structures. IMPRESSION: No acute cardiopulmonary abnormality or acute traumatic injury identified. Electronically Signed   By: VEAR Hurst M.D.   On: 01/22/2024 05:48   CT CERVICAL SPINE WO CONTRAST Result Date: 01/21/2024 CLINICAL DATA:  Polytrauma, blunt EXAM: CT CERVICAL SPINE WITHOUT CONTRAST TECHNIQUE: Multidetector CT imaging of the cervical spine was performed without intravenous contrast. Multiplanar CT image reconstructions were also generated. RADIATION DOSE REDUCTION: This exam was performed according to the departmental dose-optimization program which includes automated exposure control, adjustment  of the mA and/or kV according to patient size and/or use of iterative reconstruction technique. COMPARISON:  CT of the cervical spine dated January 11, 2024. FINDINGS: Alignment: Normal. Skull base and vertebrae: Vertebral bodies are intact. There is diffuse idiopathic skeletal hyperostosis and patient is status post anterior spinal fusion at C4-5, C5-6 and C6-7. There are anterior bridging osteophytes extending from C3 through T3. There is also ossification of the posterior longitudinal ligament at C2-3 and C3-4, which is causing mild to moderate central spinal canal stenosis at these levels. There is no evidence of fracture or osseous lesion. Soft tissues and spinal canal: No paraspinous hematoma or soft tissue injury evident. Disc levels: There is mild-to-moderate central spinal canal stenosis at C2-3, C3-4, C5-6 and C6-7 secondary to combination of ossification of the posterior longitudinal ligament and endplate ridging. Upper chest: Are ground-glass and reticular opacities present dependently within the upper lobes bilaterally. Other: None. IMPRESSION: 1. Stable postoperative changes and rigid spine secondary to diffuse idiopathic  skeletal hyperostosis. No evidence of acute traumatic injury. Electronically Signed   By: Evalene Coho M.D.   On: 01/21/2024 08:13   CT HEAD WO CONTRAST Result Date: 01/21/2024 CLINICAL DATA:  Head trauma, moderate-severe EXAM: CT HEAD WITHOUT CONTRAST TECHNIQUE: Contiguous axial images were obtained from the base of the skull through the vertex without intravenous contrast. RADIATION DOSE REDUCTION: This exam was performed according to the departmental dose-optimization program which includes automated exposure control, adjustment of the mA and/or kV according to patient size and/or use of iterative reconstruction technique. COMPARISON:  CT of the head dated January 11, 2024. FINDINGS: Brain: Age-related atrophy and moderate periventricular and deep cerebral white matter disease. No  evidence of hemorrhage, mass, acute cortical infarct or hydrocephalus. Vascular: Negative. Skull: Intact and unremarkable. Sinuses/Orbits: Clear paranasal sinuses and mastoid air cells. Status post bilateral lens replacement. The orbits are unremarkable otherwise. Other: None. IMPRESSION: 1. Age-related atrophy and moderate cerebral white matter disease. No evidence of acute intracranial injury. Electronically Signed   By: Evalene Coho M.D.   On: 01/21/2024 08:06     Procedures   Medications Ordered in the ED - No data to display  Clinical Course as of 01/22/24 0645  Sat Jan 22, 2024  0631 CT CERVICAL SPINE WO CONTRAST [CC]    Clinical Course User Index [CC] Jerral Meth, MD                                 Medical Decision Making Amount and/or Complexity of Data Reviewed Labs: ordered. Radiology: ordered.   This patient presents to the ED for concern of fall on thinners differential diagnosis includes hip fracture, rib fracture, brain bleed, C-spine injury    Additional history obtained   Additional history obtained from Electronic Medical Record External records from outside source obtained and reviewed including family medicine notes   Lab Tests:  I Ordered, and personally interpreted labs.  The pertinent results include: Mild hyponatremia 133, mildly decreased potassium at 3.4, lactic acid 1.7, CBC WNL,    Imaging Studies ordered:  I ordered imaging studies including chest x-ray, pelvis x-ray, CT head and C-spine Noncon I independently visualized and interpreted imaging which showed chest x-ray showed no acute cardiopulmonary abnormality or acute traumatic injury identified.  Pelvis x-ray showed no acute fracture or dislocation identified in the pelvis.  CT head and C-spine with no acute traumatic injury identified in the cervical spine and no acute intracranial abnormality or acute traumatic injury identified. I agree with the radiologist  interpretation  Problem List / ED Course:  Considered for admission or further workup however patient's vital signs, physical exam, labs, and imaging are reassuring.  Patient advised to take Tylenol  as needed for pain.  Patient advised to follow-up with PCP for further evaluation and workup if his symptoms persist.  Patient given return precautions.  I feel patient safe for discharge at this time.     Final diagnoses:  Fall, initial encounter    ED Discharge Orders     None          Francis Ileana LOISE DEVONNA 01/22/24 [id number]    Jerral Meth, MD 01/22/24 [phone]

## 2024-01-23 ENCOUNTER — Encounter (HOSPITAL_COMMUNITY): Payer: Self-pay

## 2024-01-23 ENCOUNTER — Emergency Department (HOSPITAL_COMMUNITY)
Admission: EM | Admit: 2024-01-23 | Discharge: 2024-01-23 | Disposition: A | Discharge status: Home / Self Care | Attending: Emergency Medicine | Admitting: Emergency Medicine

## 2024-01-23 ENCOUNTER — Other Ambulatory Visit: Payer: Self-pay

## 2024-01-23 DIAGNOSIS — R531 Weakness: Secondary | ICD-10-CM | POA: Diagnosis not present

## 2024-01-23 DIAGNOSIS — W07XXXA Fall from chair, initial encounter: Secondary | ICD-10-CM | POA: Insufficient documentation

## 2024-01-23 DIAGNOSIS — R519 Headache, unspecified: Secondary | ICD-10-CM | POA: Diagnosis not present

## 2024-01-23 DIAGNOSIS — Z7401 Bed confinement status: Secondary | ICD-10-CM | POA: Diagnosis not present

## 2024-01-23 DIAGNOSIS — Z7901 Long term (current) use of anticoagulants: Secondary | ICD-10-CM | POA: Insufficient documentation

## 2024-01-23 DIAGNOSIS — W19XXXA Unspecified fall, initial encounter: Secondary | ICD-10-CM | POA: Diagnosis not present

## 2024-01-23 DIAGNOSIS — Z743 Need for continuous supervision: Secondary | ICD-10-CM | POA: Diagnosis not present

## 2024-01-23 DIAGNOSIS — R4182 Altered mental status, unspecified: Secondary | ICD-10-CM | POA: Diagnosis not present

## 2024-01-23 MED ORDER — ACETAMINOPHEN 325 MG PO TABS
650.0000 mg | ORAL_TABLET | Freq: Once | ORAL | Status: AC
  Administered 2024-01-23: 650 mg via ORAL
  Filled 2024-01-23: qty 2

## 2024-01-23 NOTE — Progress Notes (Signed)
 Clinical chaplain responding to Level 2 - fall on thinners.  Pt in assessment with care team.  No family present.  No spiritual care needs identified at this time.  Will forward to oncoming chaplain for continued assessment.

## 2024-01-23 NOTE — ED Notes (Signed)
 [facility] called

## 2024-01-23 NOTE — ED Notes (Signed)
 Facility called x2. 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 Last call no RN was told no RN were present who could take report this shift, [place].

## 2024-01-23 NOTE — ED Provider Notes (Signed)
 [place] EMERGENCY DEPARTMENT AT [facility] Provider Note   CSN: [id number] Arrival date & time: 01/23/24  1046     Patient presents with: [name] Dorethia is a 63 y.o. male.   HPI [facility].  He reportedly slid out of his chair and when he did he hit his head.  There was no loss of consciousness.  Patient does take Eliquis .  Patient arrives alert and at baseline mental status.  Patient does not have complaint of significant headache.  He does not have any complaint of chest pain or pain in the hips or pelvis from the fall.    Prior to Admission medications   Medication Sig Start Date End Date Taking? Authorizing Provider  allopurinol  (ZYLOPRIM ) 300 MG tablet TAKE 1 TABLET BY MOUTH EVERY DAY 10/01/22   Malvina Ellen, MD  apixaban  (ELIQUIS ) 5 MG TABS tablet Take 1 tablet (5 mg total) by mouth 2 (two) times daily. 10/19/22   McDiarmid, Krystal BIRCH, MD  atorvastatin  (LIPITOR) 40 MG tablet TAKE 1 TABLET BY MOUTH EVERY DAY Patient taking differently: Take 40 mg by mouth at bedtime. 04/15/22   Lilland, Alana, DO  benztropine  (COGENTIN ) 0.5 MG tablet Take 1 tablet (0.5 mg total) by mouth 2 (two) times daily. 01/17/24   Howell Lunger, DO  divalproex  (DEPAKOTE  ER) 250 MG 24 hr tablet Take 1 tablet (250 mg total) by mouth every morning AND 2 tablets (500 mg total) at bedtime. Patient taking differently: Give 1 tablet (250mg ) by mouth every morning. 12/28/23   Whitfield Raisin, NP  divalproex  (DEPAKOTE ) 500 MG DR tablet Take 500 mg by mouth at bedtime.    [provider]  levothyroxine  (SYNTHROID ) 125 MCG tablet Take 1 tablet (125 mcg total) by mouth daily. 04/16/23   Dameron, Marisa, DO  omeprazole  (PRILOSEC) 40 MG capsule Take 1 capsule (40 mg total) by mouth daily. 07/13/23   Howell Lunger, DO  perphenazine  (TRILAFON ) 8 MG tablet Take 3 tablets (24 mg total) by mouth at bedtime. 12/02/23   Arfeen, Leni DASEN, MD  phenelzine  (NARDIL ) 15 MG tablet Take 3 tablets  (45 mg total) by mouth 2 (two) times daily. 12/02/23   Arfeen, Leni DASEN, MD  polyethylene glycol (MIRALAX  / GLYCOLAX ) 17 g packet Take 34 g by mouth daily. Patient taking differently: Take 17 g by mouth every 12 (twelve) hours as needed (constipation). 04/16/23   Dameron, Marisa, DO  Semaglutide , 1 MG/DOSE, (OZEMPIC , 1 MG/DOSE,) 4 MG/3ML SOPN Inject 1 mg into the skin every Wednesday.    [provider]  senna-docusate (SENOKOT-S) 8.6-50 MG tablet Take 1 tablet by mouth at bedtime. 04/16/23   Dameron, Marisa, DO    Allergies: Glucophage  [metformin ] and Lamictal  [lamotrigine ]    Review of Systems  Updated Vital Signs BP (!) 118/100   Pulse 78   Temp 98 F (36.7 C)   Resp 19   Ht 5' 10 (1.778 m)   Wt 93 kg   SpO2 96%   BMI 29.41 kg/m   Physical Exam Constitutional:      Comments: Patient is awake and alert.  He is very deconditioned in appearance.  He has a visible tremor of the extremities which is highly suggestive of Parkinson disease.  He has facial appearance of Parkinson's with decreased facial expression and seborrheic dermatitis  HENT:     Mouth/Throat:     Mouth: Mucous membranes are dry.     Pharynx: Oropharynx is clear.  Eyes:     Extraocular Movements: Extraocular movements intact.     Pupils: Pupils are equal, round, and reactive to light.  Neck:     Comments: No midline tenderness. Cardiovascular:     Rate and Rhythm: Normal rate and regular rhythm.  Pulmonary:     Effort: Pulmonary effort is normal.     Breath sounds: Normal breath sounds.  Chest:     Chest wall: No tenderness.  Abdominal:     General: There is no distension.     Palpations: Abdomen is soft.     Tenderness: There is no abdominal tenderness. There is no guarding.  Musculoskeletal:        General: No deformity or signs of injury. Normal range of motion.  Skin:    General: Skin is warm and dry.  Neurological:     Comments: Patient is awake and alert.  He has visible tremor of his  extremities at rest.  This waxes and wanes in intensity.  Patient can respond and follow commands for performing grip strength and elevating each lower extremity independently off of the bed.  No focal deficit.  Psychiatric:        Mood and Affect: Mood normal.     (all labs ordered are listed, but only abnormal results are displayed) Labs Reviewed - No data to display  EKG: None  Radiology: CT CERVICAL SPINE WO CONTRAST Result Date: 01/22/2024 CLINICAL DATA:  63 year old male status post unwitnessed fall, found down. On Eliquis . EXAM: CT CERVICAL SPINE WITHOUT CONTRAST TECHNIQUE: Multidetector CT imaging of the cervical spine was performed without intravenous contrast. Multiplanar CT image reconstructions were also generated. RADIATION DOSE REDUCTION: This exam was performed according to the departmental dose-optimization program which includes automated exposure control, adjustment of the mA and/or kV according to patient size and/or use of iterative reconstruction technique. COMPARISON:  Head CT today.  Cervical spine CT yesterday. FINDINGS: Alignment: Stable cervical lordosis. Ankylosed cervicothoracic junction. Bilateral posterior element alignment is within normal limits. Skull base and vertebrae: Visualized skull base is intact. No atlanto-occipital dissociation. C1 and C2 are chronically degenerated but appear intact and aligned. Postoperative details are below. No acute osseous abnormality identified. Soft tissues and spinal canal: No prevertebral fluid or swelling. No visible canal hematoma. Negative noncontrast visible neck soft tissues. 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 Ankylosis/arthrodesis from C3 through the cervicothoracic junction. Bulky osteophytes and OPLL at C2-C3 without ankylosis. C2-C3 and C3-C4 spinal  stenosis which appears stable. Upper chest: Stable and negative visible noncontrast thoracic inlet. IMPRESSION: 1. No acute traumatic injury identified in the cervical spine. 2. 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 Superimposed C2 and C3 level OPLL with spinal stenosis. Electronically Signed   By: VEAR Hurst M.D.   On: 01/22/2024 06:06   CT HEAD WO CONTRAST Result Date: 01/22/2024 CLINICAL DATA:  63 year old male status post unwitnessed fall, found down. On Eliquis . EXAM: CT HEAD WITHOUT CONTRAST TECHNIQUE: Contiguous axial images were obtained from the base of the skull through the vertex without intravenous contrast. RADIATION DOSE REDUCTION: This exam was performed according to the departmental dose-optimization program which includes automated exposure control, adjustment of the mA and/or kV according to patient size and/or use of iterative reconstruction technique. COMPARISON:  Brain MRI 12/22/2023.  Head CT yesterday 01/21/2024. FINDINGS: Brain: No midline shift, ventriculomegaly, mass  effect, evidence of mass lesion, intracranial hemorrhage or evidence of cortically based acute infarction. Stable cerebral volume. Stable gray-white matter differentiation throughout the brain. Patchy bilateral white matter changes stable from MRI last month. Vascular: No suspicious intracranial vascular hyperdensity. Skull: Stable and intact. Sinuses/Orbits: Visualized paranasal sinuses and mastoids are stable and well aerated. Other: Orbit and scalp soft tissues stable from yesterday. No new soft tissue injury identified. IMPRESSION: Stable from yesterday. No acute intracranial abnormality or acute traumatic injury identified. Electronically Signed   By: VEAR Hurst M.D.   On: 01/22/2024 06:03   DG Pelvis Portable Result Date: 01/22/2024 CLINICAL DATA:  63 year old male status post unwitnessed fall, found down. On Eliquis . EXAM: PORTABLE PELVIS 1-2 VIEWS  COMPARISON:  CT Abdomen and Pelvis 03/24/2015. Pelvis radiograph 08/13/2021. FINDINGS: Portable AP supine view at 0535 hours. Bone mineralization is within normal limits. Femoral heads normally located. Pelvis appears intact. Symphysis and SI joints are stable. Grossly stable and intact proximal femurs. Normal visible bowel gas pattern. IMPRESSION: No acute fracture or dislocation identified about the pelvis. Electronically Signed   By: VEAR Hurst M.D.   On: 01/22/2024 05:49   DG Chest Port 1 View Result Date: 01/22/2024 CLINICAL DATA:  63 year old male status post unwitnessed fall, found down. On Eliquis . EXAM: PORTABLE CHEST 1 VIEW COMPARISON:  Chest radiograph 12/21/2023 and earlier. FINDINGS: Portable AP supine view at 0535 hours. C-collar artifact. Prior cervical ACDF. Lower lung volumes. Stable cardiac size and mediastinal contours. Allowing for portable technique the lungs are clear. No pneumothorax or pleural effusion evident on this supine view. Paucity of bowel gas. Stable visualized osseous structures. IMPRESSION: No acute cardiopulmonary abnormality or acute traumatic injury identified. Electronically Signed   By: VEAR Hurst M.D.   On: 01/22/2024 05:48     Procedures   Medications Ordered in the ED  acetaminophen  (TYLENOL ) tablet 650 mg (650 mg Oral Given 01/23/24 1220)                                    Medical Decision Making Risk OTC drugs.   Patient presents as outlined.  He had a minor fall from a seated position sliding to the floor.  No visible evidence of head injury.  Patient does take Eliquis  however mechanism is very low.  Patient has been seen twice this month and within the past 2 days for falls.  At this time falls appear very likely due to what clinically looks like advanced Parkinson's disease review of EMR indicates neurology has diagnosed him with drug-induced Parkinson's disease.  At this time with low mechanism for injury and multiple frequent falls without  intracranial bleed, clinical assessment suggest patient does not have intracerebral bleed.  I have observed him for 2 hours in the emergency department and there has been no deterioration or change.  At this time my recommendations for continued observation at [facility] for any clinical evidence of intracranial bleeding.  Patient had lab work done over the past couple of days which has normal GFR and normal hemoglobin.  Today I do not see any indication that this would have changed acutely and did not need additional lab work.      Final diagnoses:  Fall, initial encounter  Anticoagulated    ED Discharge Orders     None          Armenta Canning, MD 01/23/24 1345

## 2024-01-23 NOTE — ED Notes (Signed)
 Per Dr. Elbridge, no labs or imaging at this time. Will continue to monitor patient for any signs of worsening symptoms or change in mental status.

## 2024-01-23 NOTE — ED Notes (Signed)
 Called [facility] to give report. No answer at this time.

## 2024-01-23 NOTE — Discharge Instructions (Addendum)
 1.  Patient was evaluated the emergency department.  At this time clinically there is not a sign of head injury.  Patient does take an blood thinner but he was seen 7\8 and 7\18 whereupon CT scans of the head were done.  At this time with no sign of injury, a CT scan is not being done.  Recommendations for observing the patient for any signs of injury such as a bad headache or change in level of consciousness or normal activity. 2.  The patient has Parkinson's disease and unsteady gait.  He has a constant fall risk.  Try to encourage and take care to avoid falls is much as possible.

## 2024-01-23 NOTE — Progress Notes (Signed)
 Orthopedic Tech Progress Note Patient Details:  [name] 09-03-1960 [id number]  Level II trauma, no ortho tech orders at this time.  Patient ID: [name], male   DOB: 1961/06/23, 63 y.o.   MRN: [id number]  [name] 01/23/2024, 11:22 AM

## 2024-01-23 NOTE — ED Triage Notes (Addendum)
 PT arrives via EMS from [facility]. Pt slid out of his chair. Pt did strike his head. No LOC. Pt is on Eliquis .  He arrives AxOx4. He was confused on the current month, but was aware of the year.This is his reported baseline mental status. Pt had a ct of head and neck yesterday and the day prior. Pt c/o lower back pain. Cms intact.

## 2024-01-23 NOTE — ED Notes (Signed)
 Left in care of PTAR, in no new onset distress at this time.

## 2024-01-24 ENCOUNTER — Ambulatory Visit: Admitting: Student

## 2024-01-26 DIAGNOSIS — Z7901 Long term (current) use of anticoagulants: Secondary | ICD-10-CM | POA: Diagnosis not present

## 2024-01-26 DIAGNOSIS — G40909 Epilepsy, unspecified, not intractable, without status epilepticus: Secondary | ICD-10-CM | POA: Diagnosis not present

## 2024-01-26 DIAGNOSIS — E669 Obesity, unspecified: Secondary | ICD-10-CM | POA: Diagnosis not present

## 2024-01-26 DIAGNOSIS — F419 Anxiety disorder, unspecified: Secondary | ICD-10-CM | POA: Diagnosis not present

## 2024-01-26 DIAGNOSIS — R296 Repeated falls: Secondary | ICD-10-CM | POA: Diagnosis not present

## 2024-01-26 DIAGNOSIS — E039 Hypothyroidism, unspecified: Secondary | ICD-10-CM | POA: Diagnosis not present

## 2024-01-26 DIAGNOSIS — E1169 Type 2 diabetes mellitus with other specified complication: Secondary | ICD-10-CM | POA: Diagnosis not present

## 2024-01-26 DIAGNOSIS — F251 Schizoaffective disorder, depressive type: Secondary | ICD-10-CM | POA: Diagnosis not present

## 2024-01-26 DIAGNOSIS — Z9181 History of falling: Secondary | ICD-10-CM | POA: Diagnosis not present

## 2024-01-26 DIAGNOSIS — Z6830 Body mass index (BMI) 30.0-30.9, adult: Secondary | ICD-10-CM | POA: Diagnosis not present

## 2024-01-26 DIAGNOSIS — Z7985 Long-term (current) use of injectable non-insulin antidiabetic drugs: Secondary | ICD-10-CM | POA: Diagnosis not present

## 2024-01-26 DIAGNOSIS — M48061 Spinal stenosis, lumbar region without neurogenic claudication: Secondary | ICD-10-CM | POA: Diagnosis not present

## 2024-01-26 DIAGNOSIS — I4819 Other persistent atrial fibrillation: Secondary | ICD-10-CM | POA: Diagnosis not present

## 2024-01-26 DIAGNOSIS — F32A Depression, unspecified: Secondary | ICD-10-CM | POA: Diagnosis not present

## 2024-01-26 DIAGNOSIS — E785 Hyperlipidemia, unspecified: Secondary | ICD-10-CM | POA: Diagnosis not present

## 2024-01-27 ENCOUNTER — Ambulatory Visit: Admitting: Student

## 2024-01-27 ENCOUNTER — Encounter: Payer: Self-pay | Admitting: Psychology

## 2024-01-27 ENCOUNTER — Encounter: Payer: Self-pay | Admitting: Student

## 2024-01-27 ENCOUNTER — Telehealth: Payer: Self-pay | Admitting: Student

## 2024-01-27 VITALS — BP 133/76 | HR 98 | Ht 70.0 in | Wt 194.8 lb

## 2024-01-27 DIAGNOSIS — E119 Type 2 diabetes mellitus without complications: Secondary | ICD-10-CM

## 2024-01-27 DIAGNOSIS — R296 Repeated falls: Secondary | ICD-10-CM | POA: Diagnosis not present

## 2024-01-27 DIAGNOSIS — R4189 Other symptoms and signs involving cognitive functions and awareness: Secondary | ICD-10-CM | POA: Diagnosis not present

## 2024-01-27 DIAGNOSIS — R82998 Other abnormal findings in urine: Secondary | ICD-10-CM

## 2024-01-27 DIAGNOSIS — E039 Hypothyroidism, unspecified: Secondary | ICD-10-CM

## 2024-01-27 DIAGNOSIS — R634 Abnormal weight loss: Secondary | ICD-10-CM

## 2024-01-27 MED ORDER — SEMAGLUTIDE(0.25 OR 0.5MG/DOS) 2 MG/1.5ML ~~LOC~~ SOPN
0.5000 mg | PEN_INJECTOR | SUBCUTANEOUS | 1 refills | Status: DC
Start: 2024-01-27 — End: 2024-02-11

## 2024-01-27 NOTE — Patient Instructions (Addendum)
 It was great to see you! Thank you for allowing me to participate in your care!   I recommend that you always bring your medications to each appointment as this makes it easy to ensure we are on the correct medications and helps us  not miss when refills are needed.  Our plans for today:  - I have placed orders for wheelchair DME - Please follow-up with neurology and get your DAT scan - We are going to further reduce your Ozempic  dose to 0.5 mg weekly, since you are still losing weight-the speed of which is concerning. - We are checking some labs today, I will call you if they are abnormal will send you a MyChart message or a letter if they are normal.  If you do not hear about your labs in the next 2 weeks please let us  know.  Take care and seek immediate care sooner if you develop any concerns. Please remember to show up 15 minutes before your scheduled appointment time!  [name], DO [facility]

## 2024-01-27 NOTE — Assessment & Plan Note (Signed)
-   Decrease Ozempic  as above, TSH as above.  Recent CBC and metabolic were unremarkable. - Follow-up 4 weeks

## 2024-01-27 NOTE — Telephone Encounter (Signed)
 Patient dropped off form at front desk for Provider Visit Form.  Verified that patient section of form has been completed.  Last DOS/WCC with PCP was 01/27/2024.  Placed form in Dr.Bronson Box .  Burnard Gander

## 2024-01-27 NOTE — Progress Notes (Signed)
 SUBJECTIVE:   CHIEF COMPLAINT / HPI:   Tremors  cognitive decline Patient was admitted on 12/21/2023 for altered mental status and discharged on 12/23/2023. He returned to his baseline on day of discharge. Workup for AMS was unremarkable including electrolyte derangements, infection, stroke. MRI brain without contrast revealed chronic and progressive brain volume loss predominantly in the frontal and temporal lobes-suspicious for frontotemporal degeneration.  He was seen by neurology on 12/28/2023-he is pending a DAT scan to evaluate Parkinson's disease.  He is pending neuropsychology evaluation abstain which FTD from other neurodegenerative syndromes and no underlying psychiatric disorder.  He is alert and conversational, but noticeably slower speech and trouble with detail than appointments prior to his most recent hospitalization. For example, he spontaneously remembered this provider's name without looking at badge, but forgot I was his primary care doctor. When asked where he was, several seconds to think before saying doctors office at [facility]. But did not remember we are his FM docs.  Recurrent falls Since he was last seen in our clinic on 01/06/2024 for staple removal, he has been to the ED 4 more times for falls.  I am quite concerned for patient's safety and wellbeing given his recurrent falls.  Unable to obtain new rollator due to insurance (too soon for new walker), but at this point I do think he may need a wheelchair given his instability and recurrent falls.  We will place DME order for wheelchair.  Hypothyroidism He was brought back for 4-week follow-up, due to his continued decreasing TSH in setting of Synthroid  use, we want to make sure were not overtreating him particularly with this new worsening cognitive impairment and recurrent falls and continued weight loss (weight loss likely 2/2 his ozempic  which we decreased)  T2DM Currently only on Ozempic , good control however  has had pretty significant weight loss.  Weight loss Continues to lose weight despite decreasing Ozempic  from 2 mg to 1 mg.  Although weight loss is expected, despite of which she is losing weight and his overall frailty I would prefer to slow any further weight loss.  He had an EGD done in April of this year, which showed Barrett's esophagus but at that time did not show malignancy-however if he continues lose weight loss despite decreasing Ozempic  we may need to reach out to GI for consideration of another biopsy to rule out cancer.  May also need to consider other imaging modalities, reassuring his CBC and metabolic panel most recently were unremarkable.  Dark Urine Asymptomatic dark urine, dark yellow to brown.  Patient requesting UA.   OBJECTIVE:   BP 133/76   Pulse 98   Ht 5' 10 (1.778 m)   Wt 194 lb 12.8 oz (88.4 kg)   SpO2 95%   BMI 27.95 kg/m    General: NAD, pleasant, ambulates with walker Cardio: RRR, no MRG. Cap Refill <2s. Respiratory: CTAB, normal wob on RA GI: Abdomen is soft, not tender, not distended. BS present Skin: Warm and dry Neuro: A&Ox4, resting tremor (chronic)  ASSESSMENT/PLAN:   Assessment & Plan Hypothyroidism, unspecified type TSH was WNL, however given his weight loss and AMS with lower level of TSH we will recheck to make sure that we are not overtreating him with Synthroid . - TSH - Adjust Synthroid  as necessary Recurrent falls Multifactorial: Cognitive decline, history of lumbar stenosis, tremors (drug-induced versus parkinsonism).  Need for risk of mitigation. - DME wheelchair - Recommend 4-week follow-up in our geriatric clinic, routed note to our  coordinator Cognitive decline Multifactorial. - Follow-up with neurology - Continue current psych meds, pending neuropsych eval Dark urine - Unable to obtain collateral information, but description does not sound like gross hematuria - Unable to provide urine sample today - Will try at his  follow-up visit - Certainly if infection was identified, we could treat this.  Additionally, if hematuria is present would likely referral to urology for cystoscopy. Type 2 diabetes mellitus without complication, without long-term current use of insulin  (HCC) A1C 5.5 (1 mo ago).  Well-controlled with Ozempic  alone, however concern rapid loss of weight. - Decrease to 0.5 mg Ozempic  weekly, monitor weight and A1c Loss of weight - Decrease Ozempic  as above, TSH as above.  Recent CBC and metabolic were unremarkable. - Follow-up 4 weeks  [name], DO [facility]

## 2024-01-27 NOTE — Assessment & Plan Note (Addendum)
 A1C 5.5 (1 mo ago).  Well-controlled with Ozempic  alone, however concern rapid loss of weight. - Decrease to 0.5 mg Ozempic  weekly, monitor weight and A1c

## 2024-01-27 NOTE — Telephone Encounter (Signed)
 Forms were placed in doctors box.

## 2024-01-28 ENCOUNTER — Ambulatory Visit: Payer: Self-pay | Admitting: Family Medicine

## 2024-01-28 LAB — TSH RFX ON ABNORMAL TO FREE T4: TSH: 0.626 u[IU]/mL (ref 0.450–4.500)

## 2024-01-31 ENCOUNTER — Telehealth: Payer: Self-pay

## 2024-01-31 ENCOUNTER — Ambulatory Visit: Admitting: Student

## 2024-01-31 DIAGNOSIS — F32A Depression, unspecified: Secondary | ICD-10-CM | POA: Diagnosis not present

## 2024-01-31 DIAGNOSIS — E785 Hyperlipidemia, unspecified: Secondary | ICD-10-CM | POA: Diagnosis not present

## 2024-01-31 DIAGNOSIS — Z7901 Long term (current) use of anticoagulants: Secondary | ICD-10-CM | POA: Diagnosis not present

## 2024-01-31 DIAGNOSIS — G40909 Epilepsy, unspecified, not intractable, without status epilepticus: Secondary | ICD-10-CM | POA: Diagnosis not present

## 2024-01-31 DIAGNOSIS — R296 Repeated falls: Secondary | ICD-10-CM | POA: Diagnosis not present

## 2024-01-31 DIAGNOSIS — I4819 Other persistent atrial fibrillation: Secondary | ICD-10-CM | POA: Diagnosis not present

## 2024-01-31 DIAGNOSIS — F419 Anxiety disorder, unspecified: Secondary | ICD-10-CM | POA: Diagnosis not present

## 2024-01-31 DIAGNOSIS — E1169 Type 2 diabetes mellitus with other specified complication: Secondary | ICD-10-CM | POA: Diagnosis not present

## 2024-01-31 DIAGNOSIS — Z9181 History of falling: Secondary | ICD-10-CM | POA: Diagnosis not present

## 2024-01-31 DIAGNOSIS — M48061 Spinal stenosis, lumbar region without neurogenic claudication: Secondary | ICD-10-CM | POA: Diagnosis not present

## 2024-01-31 DIAGNOSIS — E669 Obesity, unspecified: Secondary | ICD-10-CM | POA: Diagnosis not present

## 2024-01-31 DIAGNOSIS — E039 Hypothyroidism, unspecified: Secondary | ICD-10-CM | POA: Diagnosis not present

## 2024-01-31 DIAGNOSIS — F251 Schizoaffective disorder, depressive type: Secondary | ICD-10-CM | POA: Diagnosis not present

## 2024-01-31 DIAGNOSIS — Z6830 Body mass index (BMI) 30.0-30.9, adult: Secondary | ICD-10-CM | POA: Diagnosis not present

## 2024-01-31 DIAGNOSIS — Z7985 Long-term (current) use of injectable non-insulin antidiabetic drugs: Secondary | ICD-10-CM | POA: Diagnosis not present

## 2024-01-31 NOTE — Telephone Encounter (Signed)
 [name] PT with [facility] calls nurse line requesting verbal orders as follows.   2x a week for 2 weeks   She is requesting a VO for OT and SLP evals.   Verbal order given.

## 2024-02-01 ENCOUNTER — Encounter: Admitting: Psychology

## 2024-02-03 ENCOUNTER — Telehealth: Payer: Self-pay

## 2024-02-03 DIAGNOSIS — E785 Hyperlipidemia, unspecified: Secondary | ICD-10-CM | POA: Diagnosis not present

## 2024-02-03 DIAGNOSIS — F419 Anxiety disorder, unspecified: Secondary | ICD-10-CM | POA: Diagnosis not present

## 2024-02-03 DIAGNOSIS — Z6830 Body mass index (BMI) 30.0-30.9, adult: Secondary | ICD-10-CM | POA: Diagnosis not present

## 2024-02-03 DIAGNOSIS — R296 Repeated falls: Secondary | ICD-10-CM | POA: Diagnosis not present

## 2024-02-03 DIAGNOSIS — Z9181 History of falling: Secondary | ICD-10-CM | POA: Diagnosis not present

## 2024-02-03 DIAGNOSIS — F251 Schizoaffective disorder, depressive type: Secondary | ICD-10-CM | POA: Diagnosis not present

## 2024-02-03 DIAGNOSIS — F32A Depression, unspecified: Secondary | ICD-10-CM | POA: Diagnosis not present

## 2024-02-03 DIAGNOSIS — G40909 Epilepsy, unspecified, not intractable, without status epilepticus: Secondary | ICD-10-CM | POA: Diagnosis not present

## 2024-02-03 DIAGNOSIS — Z7985 Long-term (current) use of injectable non-insulin antidiabetic drugs: Secondary | ICD-10-CM | POA: Diagnosis not present

## 2024-02-03 DIAGNOSIS — M48061 Spinal stenosis, lumbar region without neurogenic claudication: Secondary | ICD-10-CM | POA: Diagnosis not present

## 2024-02-03 DIAGNOSIS — E669 Obesity, unspecified: Secondary | ICD-10-CM | POA: Diagnosis not present

## 2024-02-03 DIAGNOSIS — E039 Hypothyroidism, unspecified: Secondary | ICD-10-CM | POA: Diagnosis not present

## 2024-02-03 DIAGNOSIS — Z7901 Long term (current) use of anticoagulants: Secondary | ICD-10-CM | POA: Diagnosis not present

## 2024-02-03 DIAGNOSIS — E1169 Type 2 diabetes mellitus with other specified complication: Secondary | ICD-10-CM | POA: Diagnosis not present

## 2024-02-03 DIAGNOSIS — I4819 Other persistent atrial fibrillation: Secondary | ICD-10-CM | POA: Diagnosis not present

## 2024-02-03 NOTE — Telephone Encounter (Signed)
 Rosaline OT with [facility] calls nurse line requesting VO for [facility] OT as follows.   1x a week for 1 week  2x a week for 2 weeks 1x a week for 1 week  Verbal order given per [facility] protocol.

## 2024-02-03 NOTE — Telephone Encounter (Signed)
 Spoke with Charmaine at [facility] [phone]. To make appts for Geri clinic and to be seen by Dr. Howell. I also made sure that they know that someone has to be with him at his appts. She told me that she will make sure that the transport assistant knows to stay with him.Nelson Land, CMA

## 2024-02-04 ENCOUNTER — Encounter: Admitting: Psychology

## 2024-02-08 ENCOUNTER — Telehealth: Payer: Self-pay

## 2024-02-08 DIAGNOSIS — I4819 Other persistent atrial fibrillation: Secondary | ICD-10-CM | POA: Diagnosis not present

## 2024-02-08 DIAGNOSIS — E039 Hypothyroidism, unspecified: Secondary | ICD-10-CM | POA: Diagnosis not present

## 2024-02-08 DIAGNOSIS — M48061 Spinal stenosis, lumbar region without neurogenic claudication: Secondary | ICD-10-CM | POA: Diagnosis not present

## 2024-02-08 DIAGNOSIS — F419 Anxiety disorder, unspecified: Secondary | ICD-10-CM | POA: Diagnosis not present

## 2024-02-08 DIAGNOSIS — Z9181 History of falling: Secondary | ICD-10-CM | POA: Diagnosis not present

## 2024-02-08 DIAGNOSIS — E785 Hyperlipidemia, unspecified: Secondary | ICD-10-CM | POA: Diagnosis not present

## 2024-02-08 DIAGNOSIS — G40909 Epilepsy, unspecified, not intractable, without status epilepticus: Secondary | ICD-10-CM | POA: Diagnosis not present

## 2024-02-08 DIAGNOSIS — F251 Schizoaffective disorder, depressive type: Secondary | ICD-10-CM | POA: Diagnosis not present

## 2024-02-08 DIAGNOSIS — E1169 Type 2 diabetes mellitus with other specified complication: Secondary | ICD-10-CM | POA: Diagnosis not present

## 2024-02-08 DIAGNOSIS — Z7985 Long-term (current) use of injectable non-insulin antidiabetic drugs: Secondary | ICD-10-CM | POA: Diagnosis not present

## 2024-02-08 DIAGNOSIS — Z7901 Long term (current) use of anticoagulants: Secondary | ICD-10-CM | POA: Diagnosis not present

## 2024-02-08 DIAGNOSIS — R296 Repeated falls: Secondary | ICD-10-CM | POA: Diagnosis not present

## 2024-02-08 DIAGNOSIS — F32A Depression, unspecified: Secondary | ICD-10-CM | POA: Diagnosis not present

## 2024-02-08 DIAGNOSIS — Z6830 Body mass index (BMI) 30.0-30.9, adult: Secondary | ICD-10-CM | POA: Diagnosis not present

## 2024-02-08 DIAGNOSIS — E669 Obesity, unspecified: Secondary | ICD-10-CM | POA: Diagnosis not present

## 2024-02-08 NOTE — Telephone Encounter (Signed)
 Sonal SLP with [facility] calls nurse line requesting verbal orders HH speech as follows.   2x a week for 2 weeks  Recertification when due.   VO given per [facility] protocol.

## 2024-02-09 ENCOUNTER — Encounter (HOSPITAL_COMMUNITY)
Admission: RE | Admit: 2024-02-09 | Discharge: 2024-02-09 | Disposition: A | Discharge status: Home / Self Care | Source: Ambulatory Visit | Attending: Adult Health | Admitting: Adult Health

## 2024-02-09 ENCOUNTER — Encounter (HOSPITAL_COMMUNITY): Payer: Self-pay

## 2024-02-09 ENCOUNTER — Encounter (HOSPITAL_COMMUNITY)

## 2024-02-09 DIAGNOSIS — R251 Tremor, unspecified: Secondary | ICD-10-CM | POA: Insufficient documentation

## 2024-02-09 DIAGNOSIS — G2119 Other drug induced secondary parkinsonism: Secondary | ICD-10-CM | POA: Insufficient documentation

## 2024-02-09 MED ORDER — POTASSIUM IODIDE (ANTIDOTE) 130 MG PO TABS
ORAL_TABLET | ORAL | Status: AC
  Filled 2024-02-09: qty 1

## 2024-02-09 MED ORDER — IOFLUPANE I 123 185 MBQ/2.5ML IV SOLN
4.9000 | Freq: Once | INTRAVENOUS | Status: AC | PRN
  Administered 2024-02-09: 4.9 via INTRAVENOUS

## 2024-02-10 DIAGNOSIS — Z9181 History of falling: Secondary | ICD-10-CM | POA: Diagnosis not present

## 2024-02-10 DIAGNOSIS — R296 Repeated falls: Secondary | ICD-10-CM | POA: Diagnosis not present

## 2024-02-10 DIAGNOSIS — E785 Hyperlipidemia, unspecified: Secondary | ICD-10-CM | POA: Diagnosis not present

## 2024-02-10 DIAGNOSIS — I4819 Other persistent atrial fibrillation: Secondary | ICD-10-CM | POA: Diagnosis not present

## 2024-02-10 DIAGNOSIS — E669 Obesity, unspecified: Secondary | ICD-10-CM | POA: Diagnosis not present

## 2024-02-10 DIAGNOSIS — F419 Anxiety disorder, unspecified: Secondary | ICD-10-CM | POA: Diagnosis not present

## 2024-02-10 DIAGNOSIS — Z6830 Body mass index (BMI) 30.0-30.9, adult: Secondary | ICD-10-CM | POA: Diagnosis not present

## 2024-02-10 DIAGNOSIS — F32A Depression, unspecified: Secondary | ICD-10-CM | POA: Diagnosis not present

## 2024-02-10 DIAGNOSIS — F251 Schizoaffective disorder, depressive type: Secondary | ICD-10-CM | POA: Diagnosis not present

## 2024-02-10 DIAGNOSIS — M48061 Spinal stenosis, lumbar region without neurogenic claudication: Secondary | ICD-10-CM | POA: Diagnosis not present

## 2024-02-10 DIAGNOSIS — Z7985 Long-term (current) use of injectable non-insulin antidiabetic drugs: Secondary | ICD-10-CM | POA: Diagnosis not present

## 2024-02-10 DIAGNOSIS — E039 Hypothyroidism, unspecified: Secondary | ICD-10-CM | POA: Diagnosis not present

## 2024-02-10 DIAGNOSIS — E1169 Type 2 diabetes mellitus with other specified complication: Secondary | ICD-10-CM | POA: Diagnosis not present

## 2024-02-10 DIAGNOSIS — G40909 Epilepsy, unspecified, not intractable, without status epilepticus: Secondary | ICD-10-CM | POA: Diagnosis not present

## 2024-02-10 DIAGNOSIS — Z7901 Long term (current) use of anticoagulants: Secondary | ICD-10-CM | POA: Diagnosis not present

## 2024-02-10 NOTE — Telephone Encounter (Signed)
 Form placed up front for pick up.   Copy made for batch scanning.  I attempted to call call [facility] for pick up, as patient was unsure who brought in form. There were no instructions.   I was unable to get a hold of anyone at [facility].   Form is up front for pick up.

## 2024-02-10 NOTE — Telephone Encounter (Signed)
 Forms completed, palced in RN triage.

## 2024-02-10 NOTE — Telephone Encounter (Signed)
 Forms completed, placed in RN triage.

## 2024-02-11 ENCOUNTER — Other Ambulatory Visit: Payer: Self-pay

## 2024-02-11 DIAGNOSIS — Z9181 History of falling: Secondary | ICD-10-CM | POA: Diagnosis not present

## 2024-02-11 DIAGNOSIS — R296 Repeated falls: Secondary | ICD-10-CM | POA: Diagnosis not present

## 2024-02-11 DIAGNOSIS — E119 Type 2 diabetes mellitus without complications: Secondary | ICD-10-CM

## 2024-02-11 DIAGNOSIS — Z7901 Long term (current) use of anticoagulants: Secondary | ICD-10-CM | POA: Diagnosis not present

## 2024-02-11 DIAGNOSIS — E1169 Type 2 diabetes mellitus with other specified complication: Secondary | ICD-10-CM | POA: Diagnosis not present

## 2024-02-11 DIAGNOSIS — E669 Obesity, unspecified: Secondary | ICD-10-CM | POA: Diagnosis not present

## 2024-02-11 DIAGNOSIS — F32A Depression, unspecified: Secondary | ICD-10-CM | POA: Diagnosis not present

## 2024-02-11 DIAGNOSIS — E785 Hyperlipidemia, unspecified: Secondary | ICD-10-CM | POA: Diagnosis not present

## 2024-02-11 DIAGNOSIS — F251 Schizoaffective disorder, depressive type: Secondary | ICD-10-CM | POA: Diagnosis not present

## 2024-02-11 DIAGNOSIS — F419 Anxiety disorder, unspecified: Secondary | ICD-10-CM | POA: Diagnosis not present

## 2024-02-11 DIAGNOSIS — G40909 Epilepsy, unspecified, not intractable, without status epilepticus: Secondary | ICD-10-CM | POA: Diagnosis not present

## 2024-02-11 DIAGNOSIS — E039 Hypothyroidism, unspecified: Secondary | ICD-10-CM | POA: Diagnosis not present

## 2024-02-11 DIAGNOSIS — Z7985 Long-term (current) use of injectable non-insulin antidiabetic drugs: Secondary | ICD-10-CM | POA: Diagnosis not present

## 2024-02-11 DIAGNOSIS — M48061 Spinal stenosis, lumbar region without neurogenic claudication: Secondary | ICD-10-CM | POA: Diagnosis not present

## 2024-02-11 DIAGNOSIS — Z6830 Body mass index (BMI) 30.0-30.9, adult: Secondary | ICD-10-CM | POA: Diagnosis not present

## 2024-02-11 DIAGNOSIS — I4819 Other persistent atrial fibrillation: Secondary | ICD-10-CM | POA: Diagnosis not present

## 2024-02-11 MED ORDER — SEMAGLUTIDE(0.25 OR 0.5MG/DOS) 2 MG/1.5ML ~~LOC~~ SOPN: 0.5000 mg | PEN_INJECTOR | SUBCUTANEOUS | 1 refills | Status: DC

## 2024-02-13 ENCOUNTER — Emergency Department (HOSPITAL_COMMUNITY)

## 2024-02-13 ENCOUNTER — Emergency Department (HOSPITAL_COMMUNITY)
Admission: EM | Admit: 2024-02-13 | Discharge: 2024-02-14 | Disposition: A | Discharge status: Home / Self Care | Attending: Emergency Medicine | Admitting: Emergency Medicine

## 2024-02-13 ENCOUNTER — Other Ambulatory Visit: Payer: Self-pay

## 2024-02-13 ENCOUNTER — Encounter (HOSPITAL_COMMUNITY): Payer: Self-pay

## 2024-02-13 DIAGNOSIS — E119 Type 2 diabetes mellitus without complications: Secondary | ICD-10-CM | POA: Insufficient documentation

## 2024-02-13 DIAGNOSIS — E039 Hypothyroidism, unspecified: Secondary | ICD-10-CM | POA: Insufficient documentation

## 2024-02-13 DIAGNOSIS — R42 Dizziness and giddiness: Secondary | ICD-10-CM | POA: Diagnosis not present

## 2024-02-13 DIAGNOSIS — Z789 Other specified health status: Secondary | ICD-10-CM

## 2024-02-13 DIAGNOSIS — I700 Atherosclerosis of aorta: Secondary | ICD-10-CM | POA: Diagnosis not present

## 2024-02-13 DIAGNOSIS — S0990XA Unspecified injury of head, initial encounter: Secondary | ICD-10-CM | POA: Diagnosis not present

## 2024-02-13 DIAGNOSIS — Z7901 Long term (current) use of anticoagulants: Secondary | ICD-10-CM | POA: Diagnosis not present

## 2024-02-13 DIAGNOSIS — Z981 Arthrodesis status: Secondary | ICD-10-CM | POA: Diagnosis not present

## 2024-02-13 DIAGNOSIS — Z743 Need for continuous supervision: Secondary | ICD-10-CM | POA: Diagnosis not present

## 2024-02-13 DIAGNOSIS — R296 Repeated falls: Secondary | ICD-10-CM

## 2024-02-13 DIAGNOSIS — R9431 Abnormal electrocardiogram [ECG] [EKG]: Secondary | ICD-10-CM | POA: Diagnosis not present

## 2024-02-13 DIAGNOSIS — G4489 Other headache syndrome: Secondary | ICD-10-CM | POA: Diagnosis not present

## 2024-02-13 DIAGNOSIS — R9089 Other abnormal findings on diagnostic imaging of central nervous system: Secondary | ICD-10-CM | POA: Diagnosis not present

## 2024-02-13 DIAGNOSIS — S0003XA Contusion of scalp, initial encounter: Secondary | ICD-10-CM | POA: Diagnosis not present

## 2024-02-13 DIAGNOSIS — Z79899 Other long term (current) drug therapy: Secondary | ICD-10-CM | POA: Insufficient documentation

## 2024-02-13 DIAGNOSIS — W19XXXA Unspecified fall, initial encounter: Secondary | ICD-10-CM | POA: Diagnosis not present

## 2024-02-13 DIAGNOSIS — S3993XA Unspecified injury of pelvis, initial encounter: Secondary | ICD-10-CM | POA: Diagnosis not present

## 2024-02-13 DIAGNOSIS — F0390 Unspecified dementia without behavioral disturbance: Secondary | ICD-10-CM | POA: Diagnosis not present

## 2024-02-13 DIAGNOSIS — S299XXA Unspecified injury of thorax, initial encounter: Secondary | ICD-10-CM | POA: Diagnosis not present

## 2024-02-13 DIAGNOSIS — S199XXA Unspecified injury of neck, initial encounter: Secondary | ICD-10-CM | POA: Diagnosis not present

## 2024-02-13 LAB — CBC WITH DIFFERENTIAL/PLATELET
Abs Immature Granulocytes: 0.03 K/uL (ref 0.00–0.07)
Basophils Absolute: 0 K/uL (ref 0.0–0.1)
Basophils Relative: 0 %
Eosinophils Absolute: 0.4 K/uL (ref 0.0–0.5)
Eosinophils Relative: 5 %
HCT: 35.4 % — ABNORMAL LOW (ref 39.0–52.0)
Hemoglobin: 11.7 g/dL — ABNORMAL LOW (ref 13.0–17.0)
Immature Granulocytes: 0 %
Lymphocytes Relative: 17 %
Lymphs Abs: 1.2 K/uL (ref 0.7–4.0)
MCH: 29.6 pg (ref 26.0–34.0)
MCHC: 33.1 g/dL (ref 30.0–36.0)
MCV: 89.6 fL (ref 80.0–100.0)
Monocytes Absolute: 0.6 K/uL (ref 0.1–1.0)
Monocytes Relative: 9 %
Neutro Abs: 5.1 K/uL (ref 1.7–7.7)
Neutrophils Relative %: 69 %
Platelets: 96 K/uL — ABNORMAL LOW (ref 150–400)
RBC: 3.95 MIL/uL — ABNORMAL LOW (ref 4.22–5.81)
RDW: 17.3 % — ABNORMAL HIGH (ref 11.5–15.5)
WBC: 7.4 K/uL (ref 4.0–10.5)
nRBC: 0 % (ref 0.0–0.2)

## 2024-02-13 LAB — COMPREHENSIVE METABOLIC PANEL WITH GFR
ALT: 6 U/L (ref 0–44)
AST: 18 U/L (ref 15–41)
Albumin: 3.3 g/dL — ABNORMAL LOW (ref 3.5–5.0)
Alkaline Phosphatase: 70 U/L (ref 38–126)
Anion gap: 12 (ref 5–15)
BUN: 7 mg/dL — ABNORMAL LOW (ref 8–23)
CO2: 24 mmol/L (ref 22–32)
Calcium: 8.7 mg/dL — ABNORMAL LOW (ref 8.9–10.3)
Chloride: 101 mmol/L (ref 98–111)
Creatinine, Ser: 0.75 mg/dL (ref 0.61–1.24)
GFR, Estimated: >60 mL/min (ref >60)
Glucose, Bld: 98 mg/dL (ref 70–99)
Potassium: 3.4 mmol/L — ABNORMAL LOW (ref 3.5–5.1)
Sodium: 137 mmol/L (ref 135–145)
Total Bilirubin: 0.9 mg/dL (ref 0.0–1.2)
Total Protein: 5.8 g/dL — ABNORMAL LOW (ref 6.5–8.1)

## 2024-02-13 LAB — ABO/RH: ABO/RH(D): A POS

## 2024-02-13 LAB — CK: Total CK: 65 U/L (ref 49–397)

## 2024-02-13 LAB — APTT: aPTT: 30 s (ref 24–36)

## 2024-02-13 NOTE — Discharge Instructions (Signed)
 Thank you for allowing us  to care for you today.  You came to the emergency department because you experienced a fall.  We were able to perform laboratory studies, CT and x-ray imaging which did not show evidence of acute injury.  At this time we believe you are safe to be discharged home.  Please be sure to follow-up with your primary care provider.  Please return to the emergency department if you experience chest pain, shortness of breath, fall, passout or believe you need emergent medical care.

## 2024-02-13 NOTE — Hospital Course (Addendum)
 Allopurinol  300 mg daily Eliquis  5 mg twice daily Cogentin  0.5 mg twice daily Depakote  250 mg in the morning, 500 mg at night Levothyroxine  125 mcg daily Omeprazole  40 mg daily Perphenazine  8 mg 3 tablets at bedtime Phenelzine  15 mg 3 tablets twice daily Semaglutide  0.5 weekly Senna 8.6-50 mg tablet daily  T2DM Hypothyroidism Dementia  CT Head: Impression from Radiologist:  1. No acute intracranial abnormality. 2. No acute displaced fracture or traumatic listhesis of the cervical spine.   CT C-spine: Radiologist impression:  1. No acute intracranial abnormality. 2. No acute displaced fracture or traumatic listhesis of the cervical spine.   XR pelvis: Radiologist impression: Negative for acute traumatic injury

## 2024-02-13 NOTE — Assessment & Plan Note (Deleted)
 T2DM: diet controlled Hypothyroidism: Continue home Synthroid  Dementia: Tremor

## 2024-02-13 NOTE — ED Provider Notes (Signed)
 [place] EMERGENCY DEPARTMENT AT [facility] Provider Note   CSN: [id number] Arrival date & time: 02/13/24  2107     Patient presents with: [name] is a 63 y.o. male past medical history significant for dementia, hypothyroidism, type 2 diabetes, recurrent falls who presents emergency department from [facility] via EMS for multiple unwitnessed falls.  Patient states that in order to get out of bed he usually scoots off of his bed onto his bottom.  Patient states that while he was doing this at home he fell backwards causing him to hit his head on the metal bed frame.  Patient denies loss of consciousness.  Patient states he last took his Eliquis  2 days ago.    Fall       Prior to Admission medications   Medication Sig Start Date End Date Taking? Authorizing Provider  allopurinol  (ZYLOPRIM ) 300 MG tablet TAKE 1 TABLET BY MOUTH EVERY DAY 10/01/22   Malvina Ellen, MD  apixaban  (ELIQUIS ) 5 MG TABS tablet Take 1 tablet (5 mg total) by mouth 2 (two) times daily. 10/19/22   McDiarmid, Krystal BIRCH, MD  atorvastatin  (LIPITOR) 40 MG tablet TAKE 1 TABLET BY MOUTH EVERY DAY Patient taking differently: Take 40 mg by mouth at bedtime. 04/15/22   Lilland, Alana, DO  benztropine  (COGENTIN ) 0.5 MG tablet Take 1 tablet (0.5 mg total) by mouth 2 (two) times daily. 01/17/24   Howell Lunger, DO  divalproex  (DEPAKOTE  ER) 250 MG 24 hr tablet Take 1 tablet (250 mg total) by mouth every morning AND 2 tablets (500 mg total) at bedtime. Patient taking differently: Give 1 tablet (250mg ) by mouth every morning. 12/28/23   Whitfield Raisin, NP  divalproex  (DEPAKOTE ) 500 MG DR tablet Take 500 mg by mouth at bedtime.    [provider]  levothyroxine  (SYNTHROID ) 125 MCG tablet Take 1 tablet (125 mcg total) by mouth daily. 04/16/23   Dameron, Marisa, DO  omeprazole  (PRILOSEC) 40 MG capsule Take 1 capsule (40 mg total) by mouth daily. 07/13/23   Howell Lunger, DO  perphenazine  (TRILAFON ) 8  MG tablet Take 3 tablets (24 mg total) by mouth at bedtime. 12/02/23   Arfeen, Leni DASEN, MD  phenelzine  (NARDIL ) 15 MG tablet Take 3 tablets (45 mg total) by mouth 2 (two) times daily. 12/02/23   Arfeen, Leni DASEN, MD  polyethylene glycol (MIRALAX  / GLYCOLAX ) 17 g packet Take 34 g by mouth daily. Patient taking differently: Take 17 g by mouth every 12 (twelve) hours as needed (constipation). 04/16/23   Dameron, Marisa, DO  Semaglutide ,0.25 or 0.5MG /DOS, 2 MG/1.5ML SOPN Inject 0.5 mg into the skin once a week. 0.25 mg once weekly for 4 weeks then increase to 0.5 mg weekly for at least 4 weeks,max 1 mg 02/11/24   Howell Lunger, DO  senna-docusate (SENOKOT-S) 8.6-50 MG tablet Take 1 tablet by mouth at bedtime. 04/16/23   Dameron, Marisa, DO    Allergies: Glucophage  Allcen.Ares ] and Lamictal  [lamotrigine ]    Review of Systems  Updated Vital Signs BP 126/75   Pulse 66   Temp 97.6 F (36.4 C)   Resp 13   SpO2 99%   Physical Exam Vitals reviewed.  Constitutional:      General: He is not in acute distress.    Appearance: He is not ill-appearing.     Interventions: Cervical collar in place.  HENT:     Head: Normocephalic. No Battle's sign, abrasion, contusion, right periorbital erythema, left periorbital erythema or laceration.  Comments: Ecchymosis to occiput    Right Ear: External ear normal.     Left Ear: External ear normal.     Nose:     Right Nostril: No septal hematoma.     Left Nostril: No septal hematoma.  Eyes:     Pupils: Pupils are equal, round, and reactive to light.  Neck:     Vascular: No JVD.     Trachea: Trachea normal.     Comments: Range of motion limited secondary to cervical spine collar. Cardiovascular:     Rate and Rhythm: Normal rate.     Pulses:          Radial pulses are 2+ on the right side and 2+ on the left side.     Heart sounds: Normal heart sounds. No murmur heard. Pulmonary:     Effort: Pulmonary effort is normal. No tachypnea.  Chest:     Comments:  No anterior lateral chest wall tenderness Abdominal:     General: There is no distension.     Palpations: Abdomen is soft.     Tenderness: There is no abdominal tenderness.  Musculoskeletal:     Cervical back: Spinous process tenderness present. No muscular tenderness.     Right lower leg: No edema.     Left lower leg: No edema.  Skin:    General: Skin is warm.     Capillary Refill: Capillary refill takes less than 2 seconds.  Neurological:     Mental Status: He is alert.     Cranial Nerves: Cranial nerves 2-12 are intact. No cranial nerve deficit, dysarthria or facial asymmetry.     Sensory: Sensation is intact. No sensory deficit.     Motor: Motor function is intact. No weakness or seizure activity.  Psychiatric:        Behavior: Behavior is cooperative.     (all labs ordered are listed, but only abnormal results are displayed) Labs Reviewed  CBC WITH DIFFERENTIAL/PLATELET - Abnormal; Notable for the following components:      Result Value   RBC 3.95 (*)    Hemoglobin 11.7 (*)    HCT 35.4 (*)    RDW 17.3 (*)    Platelets 96 (*)    All other components within normal limits  COMPREHENSIVE METABOLIC PANEL WITH GFR - Abnormal; Notable for the following components:   Potassium 3.4 (*)    BUN 7 (*)    Calcium  8.7 (*)    Total Protein 5.8 (*)    Albumin  3.3 (*)    All other components within normal limits  APTT  CK  ABO/RH  TYPE AND SCREEN    EKG: None  Radiology: CT HEAD WO CONTRAST Result Date: 02/13/2024 CLINICAL DATA:  Head trauma, moderate-severe; Polytrauma, blunt. Fall EXAM: CT HEAD WITHOUT CONTRAST CT CERVICAL SPINE WITHOUT CONTRAST TECHNIQUE: Multidetector CT imaging of the head and cervical spine was performed following the standard protocol without intravenous contrast. Multiplanar CT image reconstructions of the cervical spine were also generated. RADIATION DOSE REDUCTION: This exam was performed according to the departmental dose-optimization program which  includes automated exposure control, adjustment of the mA and/or kV according to patient size and/or use of iterative reconstruction technique. COMPARISON:  CT head and C-spine 01/22/2024, MRI head 12/22/2023 FINDINGS: CT HEAD FINDINGS Brain: Cerebral ventricle sizes are concordant with the degree of cerebral volume loss. Patchy and confluent areas of decreased attenuation are noted throughout the deep and periventricular white matter of the cerebral hemispheres bilaterally, compatible with chronic  microvascular ischemic disease. No evidence of large-territorial acute infarction. No parenchymal hemorrhage. No mass lesion. No extra-axial collection. Couple of benign fat density lesions along the falx cerebri No mass effect or midline shift. No hydrocephalus. Basilar cisterns are patent. Vascular: No hyperdense vessel. Skull: No acute fracture or focal lesion. Sinuses/Orbits: Paranasal sinuses and mastoid air cells are clear. Bilateral lens replacement. Otherwise the orbits are unremarkable. Other: None. CT CERVICAL SPINE FINDINGS Alignment: Normal. Skull base and vertebrae: Multilevel moderate degenerative changes finding air posterolateral longitudinal ligament calcification from the C2-C4 levels. C4-C7 anterior cervical discectomy and fusion surgical hardware. No severe osseous foraminal or central canal stenosis. No acute fracture. No aggressive appearing focal osseous lesion or focal pathologic process. Soft tissues and spinal canal: No prevertebral fluid or swelling. No visible canal hematoma. Upper chest: Unremarkable. Other: None. IMPRESSION: 1. No acute intracranial abnormality. 2. No acute displaced fracture or traumatic listhesis of the cervical spine. Electronically Signed   By: Morgane  Naveau M.D.   On: 02/13/2024 21:45   CT CERVICAL SPINE WO CONTRAST Result Date: 02/13/2024 CLINICAL DATA:  Head trauma, moderate-severe; Polytrauma, blunt. Fall EXAM: CT HEAD WITHOUT CONTRAST CT CERVICAL SPINE WITHOUT  CONTRAST TECHNIQUE: Multidetector CT imaging of the head and cervical spine was performed following the standard protocol without intravenous contrast. Multiplanar CT image reconstructions of the cervical spine were also generated. RADIATION DOSE REDUCTION: This exam was performed according to the departmental dose-optimization program which includes automated exposure control, adjustment of the mA and/or kV according to patient size and/or use of iterative reconstruction technique. COMPARISON:  CT head and C-spine 01/22/2024, MRI head 12/22/2023 FINDINGS: CT HEAD FINDINGS Brain: Cerebral ventricle sizes are concordant with the degree of cerebral volume loss. Patchy and confluent areas of decreased attenuation are noted throughout the deep and periventricular white matter of the cerebral hemispheres bilaterally, compatible with chronic microvascular ischemic disease. No evidence of large-territorial acute infarction. No parenchymal hemorrhage. No mass lesion. No extra-axial collection. Couple of benign fat density lesions along the falx cerebri No mass effect or midline shift. No hydrocephalus. Basilar cisterns are patent. Vascular: No hyperdense vessel. Skull: No acute fracture or focal lesion. Sinuses/Orbits: Paranasal sinuses and mastoid air cells are clear. Bilateral lens replacement. Otherwise the orbits are unremarkable. Other: None. CT CERVICAL SPINE FINDINGS Alignment: Normal. Skull base and vertebrae: Multilevel moderate degenerative changes finding air posterolateral longitudinal ligament calcification from the C2-C4 levels. C4-C7 anterior cervical discectomy and fusion surgical hardware. No severe osseous foraminal or central canal stenosis. No acute fracture. No aggressive appearing focal osseous lesion or focal pathologic process. Soft tissues and spinal canal: No prevertebral fluid or swelling. No visible canal hematoma. Upper chest: Unremarkable. Other: None. IMPRESSION: 1. No acute intracranial  abnormality. 2. No acute displaced fracture or traumatic listhesis of the cervical spine. Electronically Signed   By: Morgane  Naveau M.D.   On: 02/13/2024 21:45   DG Pelvis Portable Result Date: 02/13/2024 CLINICAL DATA:  Trauma EXAM: PORTABLE PELVIS 1-2 VIEWS COMPARISON:  X-ray pelvis 01/22/2024, CT abdomen pelvis 03/24/2015, x-ray pelvis 08/13/2021, x-ray pelvis 01/22/2024 FINDINGS: There is no evidence of pelvic fracture or diastasis. No acute displaced fracture or dislocation of either hips. At least moderate right and mild left degenerative changes. No pelvic bone lesions are seen. Redemonstration of indeterminate density overlying the left sacral ala. IMPRESSION: Negative for acute traumatic injury. Electronically Signed   By: Morgane  Naveau M.D.   On: 02/13/2024 21:40   DG Chest Port 1 View Result Date: 02/13/2024 CLINICAL  DATA:  Trauma EXAM: PORTABLE CHEST 1 VIEW COMPARISON:  Chest x-ray 01/22/2024 FINDINGS: The heart and mediastinal contours are unchanged. Atherosclerotic plaque. No focal consolidation. No pulmonary edema. No pleural effusion. No pneumothorax. No acute osseous abnormality.  Cervical spine surgical hardware. IMPRESSION: 1. No active disease. 2.  Aortic Atherosclerosis (ICD10-I70.0). Electronically Signed   By: Morgane  Naveau M.D.   On: 02/13/2024 21:38     Procedures   Medications Ordered in the ED - No data to display  Clinical Course as of 02/13/24 2313  Sun Feb 13, 2024  2131 Chest x-ray reviewed by me: Trachea is midline, no evidence of pneumothorax or hemothorax.  No obvious acute fractures.  Pelvis x-ray reviewed by me: Hips are located, no obvious fracture or dislocation.  Pelvic ring intact [AG]  2143 CT head reviewed by me without evidence of acute ICH [AG]  2228 EKG reviewed by me: No evidence of acute ischemic change and no change compared to prior EKG [AG]    Clinical Course User Index [AG] Nada Chroman, DO                                 Medical  Decision Making Amount and/or Complexity of Data Reviewed Labs: ordered. Radiology: ordered.   On initial valuation patient is hemodynamically stable, afebrile and not in acute distress.  Airway is patent with bilateral breath sounds and patient has strong palpable peripheral pulses.  Cervical spine collar placed prehospital and maintained secondary to mechanism of head injury.  Primary survey performed without need for acute intervention.  Chest and pelvis x-ray obtained without evidence of acute fracture, dislocation, hemothorax or pneumothorax.  Based upon patient with positive head strike and unwitnessed fall will obtain CT head and C-spine imaging as well as laboratory studies including CK.  CT head and C-spine negative for acute fracture or dislocation or ICH.  Cervical spine collar cleared at bedside: No midline cervical spine tenderness and full passive range of motion without pain.  Laboratory studies without evidence of leukocytosis, anemia, electrolyte abnormalities, rhabdomyolysis or renal impairment.  As patient has no acute injuries and no evidence of underlying metabolic abnormality believe patient is safe to be discharged at this time.  Patient was given strict return precautions and agreed with and understood plan of care at time of discharge     Final diagnoses:  Unwitnessed fall    ED Discharge Orders     None       Chroman Nada DO Emergency Medicine PGY2    Nada Chroman, DO 02/13/24 2313    Yolande Lamar BROCKS, MD 02/17/24 1800

## 2024-02-13 NOTE — ED Triage Notes (Signed)
 Patient is coming from [place]. Patient has had three falls today. This last fall was unwitnessed, patient stated to have hit his head on his metal bed frame. No LOC. Patient is on eliquis . Aox2, hx of dementia.  EMS VS 166/102 BP 16 RR 95% RA 65 HR afib with hx of same

## 2024-02-14 ENCOUNTER — Ambulatory Visit: Payer: Self-pay | Admitting: Adult Health

## 2024-02-14 DIAGNOSIS — R4182 Altered mental status, unspecified: Secondary | ICD-10-CM | POA: Diagnosis not present

## 2024-02-14 DIAGNOSIS — Z743 Need for continuous supervision: Secondary | ICD-10-CM | POA: Diagnosis not present

## 2024-02-14 DIAGNOSIS — Z7401 Bed confinement status: Secondary | ICD-10-CM | POA: Diagnosis not present

## 2024-02-14 DIAGNOSIS — S0990XA Unspecified injury of head, initial encounter: Secondary | ICD-10-CM | POA: Diagnosis not present

## 2024-02-15 DIAGNOSIS — F251 Schizoaffective disorder, depressive type: Secondary | ICD-10-CM | POA: Diagnosis not present

## 2024-02-15 DIAGNOSIS — Z6830 Body mass index (BMI) 30.0-30.9, adult: Secondary | ICD-10-CM | POA: Diagnosis not present

## 2024-02-15 DIAGNOSIS — R296 Repeated falls: Secondary | ICD-10-CM | POA: Diagnosis not present

## 2024-02-15 DIAGNOSIS — E039 Hypothyroidism, unspecified: Secondary | ICD-10-CM | POA: Diagnosis not present

## 2024-02-15 DIAGNOSIS — E669 Obesity, unspecified: Secondary | ICD-10-CM | POA: Diagnosis not present

## 2024-02-15 DIAGNOSIS — Z7985 Long-term (current) use of injectable non-insulin antidiabetic drugs: Secondary | ICD-10-CM | POA: Diagnosis not present

## 2024-02-15 DIAGNOSIS — E785 Hyperlipidemia, unspecified: Secondary | ICD-10-CM | POA: Diagnosis not present

## 2024-02-15 DIAGNOSIS — Z9181 History of falling: Secondary | ICD-10-CM | POA: Diagnosis not present

## 2024-02-15 DIAGNOSIS — F419 Anxiety disorder, unspecified: Secondary | ICD-10-CM | POA: Diagnosis not present

## 2024-02-15 DIAGNOSIS — E1169 Type 2 diabetes mellitus with other specified complication: Secondary | ICD-10-CM | POA: Diagnosis not present

## 2024-02-15 DIAGNOSIS — Z7901 Long term (current) use of anticoagulants: Secondary | ICD-10-CM | POA: Diagnosis not present

## 2024-02-15 DIAGNOSIS — G40909 Epilepsy, unspecified, not intractable, without status epilepticus: Secondary | ICD-10-CM | POA: Diagnosis not present

## 2024-02-15 DIAGNOSIS — F32A Depression, unspecified: Secondary | ICD-10-CM | POA: Diagnosis not present

## 2024-02-15 DIAGNOSIS — M48061 Spinal stenosis, lumbar region without neurogenic claudication: Secondary | ICD-10-CM | POA: Diagnosis not present

## 2024-02-15 DIAGNOSIS — I4819 Other persistent atrial fibrillation: Secondary | ICD-10-CM | POA: Diagnosis not present

## 2024-02-16 DIAGNOSIS — Z6830 Body mass index (BMI) 30.0-30.9, adult: Secondary | ICD-10-CM | POA: Diagnosis not present

## 2024-02-16 DIAGNOSIS — F251 Schizoaffective disorder, depressive type: Secondary | ICD-10-CM | POA: Diagnosis not present

## 2024-02-16 DIAGNOSIS — Z9181 History of falling: Secondary | ICD-10-CM | POA: Diagnosis not present

## 2024-02-16 DIAGNOSIS — F32A Depression, unspecified: Secondary | ICD-10-CM | POA: Diagnosis not present

## 2024-02-16 DIAGNOSIS — G40909 Epilepsy, unspecified, not intractable, without status epilepticus: Secondary | ICD-10-CM | POA: Diagnosis not present

## 2024-02-16 DIAGNOSIS — F419 Anxiety disorder, unspecified: Secondary | ICD-10-CM | POA: Diagnosis not present

## 2024-02-16 DIAGNOSIS — I4819 Other persistent atrial fibrillation: Secondary | ICD-10-CM | POA: Diagnosis not present

## 2024-02-16 DIAGNOSIS — E785 Hyperlipidemia, unspecified: Secondary | ICD-10-CM | POA: Diagnosis not present

## 2024-02-16 DIAGNOSIS — M48061 Spinal stenosis, lumbar region without neurogenic claudication: Secondary | ICD-10-CM | POA: Diagnosis not present

## 2024-02-16 DIAGNOSIS — E1169 Type 2 diabetes mellitus with other specified complication: Secondary | ICD-10-CM | POA: Diagnosis not present

## 2024-02-16 DIAGNOSIS — E669 Obesity, unspecified: Secondary | ICD-10-CM | POA: Diagnosis not present

## 2024-02-16 DIAGNOSIS — R296 Repeated falls: Secondary | ICD-10-CM | POA: Diagnosis not present

## 2024-02-16 DIAGNOSIS — Z7985 Long-term (current) use of injectable non-insulin antidiabetic drugs: Secondary | ICD-10-CM | POA: Diagnosis not present

## 2024-02-16 DIAGNOSIS — E039 Hypothyroidism, unspecified: Secondary | ICD-10-CM | POA: Diagnosis not present

## 2024-02-16 DIAGNOSIS — Z7901 Long term (current) use of anticoagulants: Secondary | ICD-10-CM | POA: Diagnosis not present

## 2024-02-17 DIAGNOSIS — F251 Schizoaffective disorder, depressive type: Secondary | ICD-10-CM | POA: Diagnosis not present

## 2024-02-17 DIAGNOSIS — E039 Hypothyroidism, unspecified: Secondary | ICD-10-CM | POA: Diagnosis not present

## 2024-02-17 DIAGNOSIS — F32A Depression, unspecified: Secondary | ICD-10-CM | POA: Diagnosis not present

## 2024-02-17 DIAGNOSIS — M48061 Spinal stenosis, lumbar region without neurogenic claudication: Secondary | ICD-10-CM | POA: Diagnosis not present

## 2024-02-17 DIAGNOSIS — Z7985 Long-term (current) use of injectable non-insulin antidiabetic drugs: Secondary | ICD-10-CM | POA: Diagnosis not present

## 2024-02-17 DIAGNOSIS — E785 Hyperlipidemia, unspecified: Secondary | ICD-10-CM | POA: Diagnosis not present

## 2024-02-17 DIAGNOSIS — F419 Anxiety disorder, unspecified: Secondary | ICD-10-CM | POA: Diagnosis not present

## 2024-02-17 DIAGNOSIS — E1169 Type 2 diabetes mellitus with other specified complication: Secondary | ICD-10-CM | POA: Diagnosis not present

## 2024-02-17 DIAGNOSIS — G40909 Epilepsy, unspecified, not intractable, without status epilepticus: Secondary | ICD-10-CM | POA: Diagnosis not present

## 2024-02-17 DIAGNOSIS — Z7901 Long term (current) use of anticoagulants: Secondary | ICD-10-CM | POA: Diagnosis not present

## 2024-02-17 DIAGNOSIS — E669 Obesity, unspecified: Secondary | ICD-10-CM | POA: Diagnosis not present

## 2024-02-17 DIAGNOSIS — Z9181 History of falling: Secondary | ICD-10-CM | POA: Diagnosis not present

## 2024-02-17 DIAGNOSIS — I4819 Other persistent atrial fibrillation: Secondary | ICD-10-CM | POA: Diagnosis not present

## 2024-02-17 DIAGNOSIS — Z6830 Body mass index (BMI) 30.0-30.9, adult: Secondary | ICD-10-CM | POA: Diagnosis not present

## 2024-02-17 DIAGNOSIS — R296 Repeated falls: Secondary | ICD-10-CM | POA: Diagnosis not present

## 2024-02-18 ENCOUNTER — Telehealth: Payer: Self-pay

## 2024-02-18 DIAGNOSIS — E785 Hyperlipidemia, unspecified: Secondary | ICD-10-CM | POA: Diagnosis not present

## 2024-02-18 DIAGNOSIS — M48061 Spinal stenosis, lumbar region without neurogenic claudication: Secondary | ICD-10-CM | POA: Diagnosis not present

## 2024-02-18 DIAGNOSIS — Z7985 Long-term (current) use of injectable non-insulin antidiabetic drugs: Secondary | ICD-10-CM | POA: Diagnosis not present

## 2024-02-18 DIAGNOSIS — G40909 Epilepsy, unspecified, not intractable, without status epilepticus: Secondary | ICD-10-CM | POA: Diagnosis not present

## 2024-02-18 DIAGNOSIS — F32A Depression, unspecified: Secondary | ICD-10-CM | POA: Diagnosis not present

## 2024-02-18 DIAGNOSIS — E1169 Type 2 diabetes mellitus with other specified complication: Secondary | ICD-10-CM | POA: Diagnosis not present

## 2024-02-18 DIAGNOSIS — E039 Hypothyroidism, unspecified: Secondary | ICD-10-CM | POA: Diagnosis not present

## 2024-02-18 DIAGNOSIS — F419 Anxiety disorder, unspecified: Secondary | ICD-10-CM | POA: Diagnosis not present

## 2024-02-18 DIAGNOSIS — F251 Schizoaffective disorder, depressive type: Secondary | ICD-10-CM | POA: Diagnosis not present

## 2024-02-18 DIAGNOSIS — E669 Obesity, unspecified: Secondary | ICD-10-CM | POA: Diagnosis not present

## 2024-02-18 DIAGNOSIS — R296 Repeated falls: Secondary | ICD-10-CM | POA: Diagnosis not present

## 2024-02-18 DIAGNOSIS — Z7901 Long term (current) use of anticoagulants: Secondary | ICD-10-CM | POA: Diagnosis not present

## 2024-02-18 DIAGNOSIS — Z6830 Body mass index (BMI) 30.0-30.9, adult: Secondary | ICD-10-CM | POA: Diagnosis not present

## 2024-02-18 DIAGNOSIS — I4819 Other persistent atrial fibrillation: Secondary | ICD-10-CM | POA: Diagnosis not present

## 2024-02-18 DIAGNOSIS — Z9181 History of falling: Secondary | ICD-10-CM | POA: Diagnosis not present

## 2024-02-18 NOTE — Telephone Encounter (Signed)
 Sonal from [employer] calling for Speech therapy verbal orders as follows:  2 time(s) weekly for 3 week(s), then 1 time(s) weekly for 2 week(s)  Verbal orders given per [facility] protocol  Chiquita JAYSON English, RN

## 2024-02-18 NOTE — Telephone Encounter (Signed)
 Joen with [facility] returns call to nurse line.   She asks we fax form to [facility].   Fax: [phone].  Form faxed to [facility].

## 2024-02-21 ENCOUNTER — Telehealth: Payer: Self-pay

## 2024-02-21 DIAGNOSIS — Z7901 Long term (current) use of anticoagulants: Secondary | ICD-10-CM | POA: Diagnosis not present

## 2024-02-21 DIAGNOSIS — F419 Anxiety disorder, unspecified: Secondary | ICD-10-CM | POA: Diagnosis not present

## 2024-02-21 DIAGNOSIS — I4819 Other persistent atrial fibrillation: Secondary | ICD-10-CM | POA: Diagnosis not present

## 2024-02-21 DIAGNOSIS — M48061 Spinal stenosis, lumbar region without neurogenic claudication: Secondary | ICD-10-CM | POA: Diagnosis not present

## 2024-02-21 DIAGNOSIS — E1169 Type 2 diabetes mellitus with other specified complication: Secondary | ICD-10-CM | POA: Diagnosis not present

## 2024-02-21 DIAGNOSIS — Z7985 Long-term (current) use of injectable non-insulin antidiabetic drugs: Secondary | ICD-10-CM | POA: Diagnosis not present

## 2024-02-21 DIAGNOSIS — R296 Repeated falls: Secondary | ICD-10-CM | POA: Diagnosis not present

## 2024-02-21 DIAGNOSIS — E785 Hyperlipidemia, unspecified: Secondary | ICD-10-CM | POA: Diagnosis not present

## 2024-02-21 DIAGNOSIS — E669 Obesity, unspecified: Secondary | ICD-10-CM | POA: Diagnosis not present

## 2024-02-21 DIAGNOSIS — G40909 Epilepsy, unspecified, not intractable, without status epilepticus: Secondary | ICD-10-CM | POA: Diagnosis not present

## 2024-02-21 DIAGNOSIS — F251 Schizoaffective disorder, depressive type: Secondary | ICD-10-CM | POA: Diagnosis not present

## 2024-02-21 DIAGNOSIS — Z6830 Body mass index (BMI) 30.0-30.9, adult: Secondary | ICD-10-CM | POA: Diagnosis not present

## 2024-02-21 DIAGNOSIS — F32A Depression, unspecified: Secondary | ICD-10-CM | POA: Diagnosis not present

## 2024-02-21 DIAGNOSIS — E039 Hypothyroidism, unspecified: Secondary | ICD-10-CM | POA: Diagnosis not present

## 2024-02-21 DIAGNOSIS — Z9181 History of falling: Secondary | ICD-10-CM | POA: Diagnosis not present

## 2024-02-21 NOTE — Telephone Encounter (Signed)
 Rosaline OT with [facility] calls nurse line requesting VO for [facility] OT as follows.   1x a week for 1 week 2x a week for 2 weeks  1x a week for 2 weeks   Verbal order given per [facility] protocol.

## 2024-02-22 ENCOUNTER — Ambulatory Visit: Admitting: Student

## 2024-02-22 ENCOUNTER — Telehealth: Payer: Self-pay

## 2024-02-22 DIAGNOSIS — I4819 Other persistent atrial fibrillation: Secondary | ICD-10-CM | POA: Diagnosis not present

## 2024-02-22 DIAGNOSIS — G40909 Epilepsy, unspecified, not intractable, without status epilepticus: Secondary | ICD-10-CM | POA: Diagnosis not present

## 2024-02-22 DIAGNOSIS — E039 Hypothyroidism, unspecified: Secondary | ICD-10-CM | POA: Diagnosis not present

## 2024-02-22 DIAGNOSIS — F251 Schizoaffective disorder, depressive type: Secondary | ICD-10-CM | POA: Diagnosis not present

## 2024-02-22 DIAGNOSIS — E1169 Type 2 diabetes mellitus with other specified complication: Secondary | ICD-10-CM | POA: Diagnosis not present

## 2024-02-22 DIAGNOSIS — Z7985 Long-term (current) use of injectable non-insulin antidiabetic drugs: Secondary | ICD-10-CM | POA: Diagnosis not present

## 2024-02-22 DIAGNOSIS — F32A Depression, unspecified: Secondary | ICD-10-CM | POA: Diagnosis not present

## 2024-02-22 DIAGNOSIS — Z6830 Body mass index (BMI) 30.0-30.9, adult: Secondary | ICD-10-CM | POA: Diagnosis not present

## 2024-02-22 DIAGNOSIS — E669 Obesity, unspecified: Secondary | ICD-10-CM | POA: Diagnosis not present

## 2024-02-22 DIAGNOSIS — E785 Hyperlipidemia, unspecified: Secondary | ICD-10-CM | POA: Diagnosis not present

## 2024-02-22 DIAGNOSIS — Z7901 Long term (current) use of anticoagulants: Secondary | ICD-10-CM | POA: Diagnosis not present

## 2024-02-22 DIAGNOSIS — F419 Anxiety disorder, unspecified: Secondary | ICD-10-CM | POA: Diagnosis not present

## 2024-02-22 DIAGNOSIS — Z9181 History of falling: Secondary | ICD-10-CM | POA: Diagnosis not present

## 2024-02-22 DIAGNOSIS — R296 Repeated falls: Secondary | ICD-10-CM | POA: Diagnosis not present

## 2024-02-22 DIAGNOSIS — M48061 Spinal stenosis, lumbar region without neurogenic claudication: Secondary | ICD-10-CM | POA: Diagnosis not present

## 2024-02-22 NOTE — Telephone Encounter (Signed)
 Trish from [facility] calling for PT verbal orders as follows:  1 time(s) weekly for 1 week(s), then 2 time(s) weekly for 4 week(s), then 1 time per week for 3 weeks.   Verbal orders given per [facility] protocol  Chiquita JAYSON English, RN

## 2024-02-23 ENCOUNTER — Telehealth (HOSPITAL_COMMUNITY): Payer: Self-pay | Admitting: *Deleted

## 2024-02-23 ENCOUNTER — Other Ambulatory Visit: Payer: Self-pay

## 2024-02-23 DIAGNOSIS — F419 Anxiety disorder, unspecified: Secondary | ICD-10-CM

## 2024-02-23 DIAGNOSIS — R251 Tremor, unspecified: Secondary | ICD-10-CM

## 2024-02-23 DIAGNOSIS — F339 Major depressive disorder, recurrent, unspecified: Secondary | ICD-10-CM

## 2024-02-23 MED ORDER — BENZTROPINE MESYLATE 0.5 MG PO TABS: 0.5000 mg | ORAL_TABLET | Freq: Two times a day (BID) | ORAL | 2 refills | Status: DC

## 2024-02-23 NOTE — Telephone Encounter (Signed)
 He is continuing care with [facility] for cognitive decline and recent h/o falls. I reached out to neurology after one of those falls and getting lost multiple times. They worked him in and will f/u with pt.

## 2024-02-23 NOTE — Telephone Encounter (Signed)
 Requested Prescriptions   Pending Prescriptions Disp Refills   benztropine  (COGENTIN ) 0.5 MG tablet 60 tablet 2    Sig: Take 1 tablet (0.5 mg total) by mouth 2 (two) times daily.   Last seen 12/28/23, next appt 05/02/24 This medication was prescribed by pcp

## 2024-02-23 NOTE — Telephone Encounter (Signed)
 Please call patient and his strongly encouraged to follow-up with neurology.  He was seen in the emergency room on August 10 because of fall.  Had dementia and other multiple health issues.  I am concerned about his gradual declining of memory and fall.

## 2024-02-23 NOTE — Telephone Encounter (Signed)
 Writer has received 2 VM's from pt mother concerning pt's depression and his neurological status. Ms. Poyer wants you to dig deep into the neuro findings. DAT scan Negative per [facility] 02/14/24 fyi. I did reach out to [facility] but had to LVM. Pt has an in office appointment tomorrow @ 1540. Facility will provide transportation.

## 2024-02-24 ENCOUNTER — Telehealth (HOSPITAL_COMMUNITY): Payer: Self-pay | Admitting: *Deleted

## 2024-02-24 ENCOUNTER — Ambulatory Visit (HOSPITAL_COMMUNITY): Admitting: Psychiatry

## 2024-02-24 ENCOUNTER — Inpatient Hospital Stay (HOSPITAL_COMMUNITY)
Admission: EM | Admit: 2024-02-24 | Discharge: 2024-03-06 | DRG: 085 | Disposition: E | Attending: Pulmonary Disease | Admitting: Pulmonary Disease

## 2024-02-24 ENCOUNTER — Encounter (HOSPITAL_COMMUNITY): Payer: Self-pay

## 2024-02-24 ENCOUNTER — Emergency Department (HOSPITAL_COMMUNITY)

## 2024-02-24 ENCOUNTER — Telehealth (HOSPITAL_COMMUNITY): Payer: Self-pay | Admitting: Psychiatry

## 2024-02-24 ENCOUNTER — Other Ambulatory Visit: Payer: Self-pay

## 2024-02-24 ENCOUNTER — Telehealth: Payer: Self-pay | Admitting: Adult Health

## 2024-02-24 DIAGNOSIS — F419 Anxiety disorder, unspecified: Secondary | ICD-10-CM | POA: Diagnosis not present

## 2024-02-24 DIAGNOSIS — F0283 Dementia in other diseases classified elsewhere, unspecified severity, with mood disturbance: Secondary | ICD-10-CM | POA: Diagnosis present

## 2024-02-24 DIAGNOSIS — R4182 Altered mental status, unspecified: Secondary | ICD-10-CM | POA: Diagnosis not present

## 2024-02-24 DIAGNOSIS — M25559 Pain in unspecified hip: Secondary | ICD-10-CM | POA: Diagnosis not present

## 2024-02-24 DIAGNOSIS — I609 Nontraumatic subarachnoid hemorrhage, unspecified: Secondary | ICD-10-CM

## 2024-02-24 DIAGNOSIS — Z7901 Long term (current) use of anticoagulants: Secondary | ICD-10-CM | POA: Diagnosis not present

## 2024-02-24 DIAGNOSIS — E1165 Type 2 diabetes mellitus with hyperglycemia: Secondary | ICD-10-CM | POA: Diagnosis not present

## 2024-02-24 DIAGNOSIS — F209 Schizophrenia, unspecified: Secondary | ICD-10-CM | POA: Diagnosis present

## 2024-02-24 DIAGNOSIS — I10 Essential (primary) hypertension: Secondary | ICD-10-CM | POA: Diagnosis present

## 2024-02-24 DIAGNOSIS — F0280 Dementia in other diseases classified elsewhere without behavioral disturbance: Secondary | ICD-10-CM | POA: Diagnosis present

## 2024-02-24 DIAGNOSIS — D649 Anemia, unspecified: Secondary | ICD-10-CM | POA: Diagnosis present

## 2024-02-24 DIAGNOSIS — S3993XA Unspecified injury of pelvis, initial encounter: Secondary | ICD-10-CM | POA: Diagnosis not present

## 2024-02-24 DIAGNOSIS — M25551 Pain in right hip: Secondary | ICD-10-CM | POA: Diagnosis present

## 2024-02-24 DIAGNOSIS — E876 Hypokalemia: Secondary | ICD-10-CM | POA: Diagnosis present

## 2024-02-24 DIAGNOSIS — G9341 Metabolic encephalopathy: Secondary | ICD-10-CM | POA: Diagnosis not present

## 2024-02-24 DIAGNOSIS — Z87898 Personal history of other specified conditions: Secondary | ICD-10-CM

## 2024-02-24 DIAGNOSIS — E669 Obesity, unspecified: Secondary | ICD-10-CM | POA: Diagnosis present

## 2024-02-24 DIAGNOSIS — F32A Depression, unspecified: Secondary | ICD-10-CM | POA: Diagnosis present

## 2024-02-24 DIAGNOSIS — S065XAA Traumatic subdural hemorrhage with loss of consciousness status unknown, initial encounter: Secondary | ICD-10-CM | POA: Diagnosis not present

## 2024-02-24 DIAGNOSIS — M48061 Spinal stenosis, lumbar region without neurogenic claudication: Secondary | ICD-10-CM | POA: Diagnosis not present

## 2024-02-24 DIAGNOSIS — G20A1 Parkinson's disease without dyskinesia, without mention of fluctuations: Secondary | ICD-10-CM | POA: Diagnosis not present

## 2024-02-24 DIAGNOSIS — E871 Hypo-osmolality and hyponatremia: Secondary | ICD-10-CM | POA: Diagnosis present

## 2024-02-24 DIAGNOSIS — Y92531 Health care provider office as the place of occurrence of the external cause: Secondary | ICD-10-CM | POA: Diagnosis not present

## 2024-02-24 DIAGNOSIS — Y998 Other external cause status: Secondary | ICD-10-CM | POA: Diagnosis not present

## 2024-02-24 DIAGNOSIS — G3109 Other frontotemporal dementia: Secondary | ICD-10-CM | POA: Diagnosis present

## 2024-02-24 DIAGNOSIS — Z9842 Cataract extraction status, left eye: Secondary | ICD-10-CM

## 2024-02-24 DIAGNOSIS — F0284 Dementia in other diseases classified elsewhere, unspecified severity, with anxiety: Secondary | ICD-10-CM | POA: Diagnosis present

## 2024-02-24 DIAGNOSIS — F05 Delirium due to known physiological condition: Secondary | ICD-10-CM | POA: Diagnosis not present

## 2024-02-24 DIAGNOSIS — E785 Hyperlipidemia, unspecified: Secondary | ICD-10-CM | POA: Diagnosis not present

## 2024-02-24 DIAGNOSIS — Z800 Family history of malignant neoplasm of digestive organs: Secondary | ICD-10-CM

## 2024-02-24 DIAGNOSIS — R402362 Coma scale, best motor response, obeys commands, at arrival to emergency department: Secondary | ICD-10-CM | POA: Diagnosis present

## 2024-02-24 DIAGNOSIS — M1611 Unilateral primary osteoarthritis, right hip: Secondary | ICD-10-CM | POA: Diagnosis not present

## 2024-02-24 DIAGNOSIS — Z981 Arthrodesis status: Secondary | ICD-10-CM

## 2024-02-24 DIAGNOSIS — I2510 Atherosclerotic heart disease of native coronary artery without angina pectoris: Secondary | ICD-10-CM | POA: Diagnosis not present

## 2024-02-24 DIAGNOSIS — R39198 Other difficulties with micturition: Secondary | ICD-10-CM | POA: Diagnosis present

## 2024-02-24 DIAGNOSIS — I469 Cardiac arrest, cause unspecified: Secondary | ICD-10-CM | POA: Diagnosis not present

## 2024-02-24 DIAGNOSIS — R296 Repeated falls: Secondary | ICD-10-CM | POA: Diagnosis present

## 2024-02-24 DIAGNOSIS — E119 Type 2 diabetes mellitus without complications: Secondary | ICD-10-CM | POA: Diagnosis not present

## 2024-02-24 DIAGNOSIS — Z6830 Body mass index (BMI) 30.0-30.9, adult: Secondary | ICD-10-CM

## 2024-02-24 DIAGNOSIS — W19XXXA Unspecified fall, initial encounter: Secondary | ICD-10-CM | POA: Diagnosis not present

## 2024-02-24 DIAGNOSIS — I4519 Other right bundle-branch block: Secondary | ICD-10-CM | POA: Diagnosis present

## 2024-02-24 DIAGNOSIS — Z79899 Other long term (current) drug therapy: Secondary | ICD-10-CM

## 2024-02-24 DIAGNOSIS — M109 Gout, unspecified: Secondary | ICD-10-CM | POA: Diagnosis present

## 2024-02-24 DIAGNOSIS — R001 Bradycardia, unspecified: Secondary | ICD-10-CM | POA: Diagnosis not present

## 2024-02-24 DIAGNOSIS — K2270 Barrett's esophagus without dysplasia: Secondary | ICD-10-CM | POA: Diagnosis present

## 2024-02-24 DIAGNOSIS — Y9301 Activity, walking, marching and hiking: Secondary | ICD-10-CM | POA: Diagnosis present

## 2024-02-24 DIAGNOSIS — R0902 Hypoxemia: Secondary | ICD-10-CM | POA: Diagnosis not present

## 2024-02-24 DIAGNOSIS — S299XXA Unspecified injury of thorax, initial encounter: Secondary | ICD-10-CM | POA: Diagnosis not present

## 2024-02-24 DIAGNOSIS — T17890A Other foreign object in other parts of respiratory tract causing asphyxiation, initial encounter: Secondary | ICD-10-CM | POA: Diagnosis present

## 2024-02-24 DIAGNOSIS — R5381 Other malaise: Secondary | ICD-10-CM | POA: Diagnosis not present

## 2024-02-24 DIAGNOSIS — Z66 Do not resuscitate: Secondary | ICD-10-CM | POA: Diagnosis not present

## 2024-02-24 DIAGNOSIS — J690 Pneumonitis due to inhalation of food and vomit: Secondary | ICD-10-CM | POA: Diagnosis not present

## 2024-02-24 DIAGNOSIS — Z515 Encounter for palliative care: Secondary | ICD-10-CM | POA: Diagnosis not present

## 2024-02-24 DIAGNOSIS — I6782 Cerebral ischemia: Secondary | ICD-10-CM | POA: Diagnosis not present

## 2024-02-24 DIAGNOSIS — W03XXXA Other fall on same level due to collision with another person, initial encounter: Secondary | ICD-10-CM | POA: Diagnosis present

## 2024-02-24 DIAGNOSIS — J9811 Atelectasis: Secondary | ICD-10-CM | POA: Diagnosis not present

## 2024-02-24 DIAGNOSIS — R338 Other retention of urine: Secondary | ICD-10-CM | POA: Diagnosis present

## 2024-02-24 DIAGNOSIS — I4891 Unspecified atrial fibrillation: Secondary | ICD-10-CM | POA: Diagnosis not present

## 2024-02-24 DIAGNOSIS — I4819 Other persistent atrial fibrillation: Secondary | ICD-10-CM | POA: Diagnosis present

## 2024-02-24 DIAGNOSIS — M170 Bilateral primary osteoarthritis of knee: Secondary | ICD-10-CM | POA: Diagnosis present

## 2024-02-24 DIAGNOSIS — D696 Thrombocytopenia, unspecified: Secondary | ICD-10-CM | POA: Diagnosis not present

## 2024-02-24 DIAGNOSIS — S066X0A Traumatic subarachnoid hemorrhage without loss of consciousness, initial encounter: Secondary | ICD-10-CM | POA: Diagnosis present

## 2024-02-24 DIAGNOSIS — I6200 Nontraumatic subdural hemorrhage, unspecified: Secondary | ICD-10-CM | POA: Diagnosis not present

## 2024-02-24 DIAGNOSIS — G40909 Epilepsy, unspecified, not intractable, without status epilepticus: Secondary | ICD-10-CM | POA: Diagnosis present

## 2024-02-24 DIAGNOSIS — Z7189 Other specified counseling: Secondary | ICD-10-CM | POA: Diagnosis not present

## 2024-02-24 DIAGNOSIS — E1169 Type 2 diabetes mellitus with other specified complication: Secondary | ICD-10-CM | POA: Diagnosis not present

## 2024-02-24 DIAGNOSIS — R471 Dysarthria and anarthria: Secondary | ICD-10-CM | POA: Diagnosis not present

## 2024-02-24 DIAGNOSIS — T17990A Other foreign object in respiratory tract, part unspecified in causing asphyxiation, initial encounter: Secondary | ICD-10-CM | POA: Diagnosis not present

## 2024-02-24 DIAGNOSIS — Z833 Family history of diabetes mellitus: Secondary | ICD-10-CM

## 2024-02-24 DIAGNOSIS — S065X0A Traumatic subdural hemorrhage without loss of consciousness, initial encounter: Principal | ICD-10-CM | POA: Diagnosis present

## 2024-02-24 DIAGNOSIS — Z7985 Long-term (current) use of injectable non-insulin antidiabetic drugs: Secondary | ICD-10-CM

## 2024-02-24 DIAGNOSIS — J9601 Acute respiratory failure with hypoxia: Secondary | ICD-10-CM

## 2024-02-24 DIAGNOSIS — S066XAA Traumatic subarachnoid hemorrhage with loss of consciousness status unknown, initial encounter: Secondary | ICD-10-CM | POA: Diagnosis not present

## 2024-02-24 DIAGNOSIS — M160 Bilateral primary osteoarthritis of hip: Secondary | ICD-10-CM | POA: Diagnosis not present

## 2024-02-24 DIAGNOSIS — Z043 Encounter for examination and observation following other accident: Secondary | ICD-10-CM | POA: Diagnosis not present

## 2024-02-24 DIAGNOSIS — M4802 Spinal stenosis, cervical region: Secondary | ICD-10-CM | POA: Diagnosis not present

## 2024-02-24 DIAGNOSIS — J9691 Respiratory failure, unspecified with hypoxia: Secondary | ICD-10-CM | POA: Diagnosis not present

## 2024-02-24 DIAGNOSIS — E039 Hypothyroidism, unspecified: Secondary | ICD-10-CM | POA: Diagnosis present

## 2024-02-24 DIAGNOSIS — Z79890 Hormone replacement therapy (postmenopausal): Secondary | ICD-10-CM

## 2024-02-24 DIAGNOSIS — I629 Nontraumatic intracranial hemorrhage, unspecified: Secondary | ICD-10-CM | POA: Diagnosis not present

## 2024-02-24 DIAGNOSIS — F251 Schizoaffective disorder, depressive type: Secondary | ICD-10-CM | POA: Diagnosis not present

## 2024-02-24 DIAGNOSIS — M47812 Spondylosis without myelopathy or radiculopathy, cervical region: Secondary | ICD-10-CM | POA: Diagnosis not present

## 2024-02-24 DIAGNOSIS — R402242 Coma scale, best verbal response, confused conversation, at arrival to emergency department: Secondary | ICD-10-CM | POA: Diagnosis present

## 2024-02-24 DIAGNOSIS — M85851 Other specified disorders of bone density and structure, right thigh: Secondary | ICD-10-CM | POA: Diagnosis not present

## 2024-02-24 DIAGNOSIS — Z9181 History of falling: Secondary | ICD-10-CM | POA: Diagnosis not present

## 2024-02-24 DIAGNOSIS — Z9841 Cataract extraction status, right eye: Secondary | ICD-10-CM

## 2024-02-24 DIAGNOSIS — Z888 Allergy status to other drugs, medicaments and biological substances status: Secondary | ICD-10-CM

## 2024-02-24 DIAGNOSIS — I6201 Nontraumatic acute subdural hemorrhage: Secondary | ICD-10-CM | POA: Diagnosis not present

## 2024-02-24 DIAGNOSIS — R0602 Shortness of breath: Secondary | ICD-10-CM | POA: Diagnosis not present

## 2024-02-24 DIAGNOSIS — R627 Adult failure to thrive: Secondary | ICD-10-CM | POA: Diagnosis present

## 2024-02-24 DIAGNOSIS — R930 Abnormal findings on diagnostic imaging of skull and head, not elsewhere classified: Secondary | ICD-10-CM | POA: Diagnosis not present

## 2024-02-24 DIAGNOSIS — G9340 Encephalopathy, unspecified: Secondary | ICD-10-CM | POA: Diagnosis not present

## 2024-02-24 DIAGNOSIS — N401 Enlarged prostate with lower urinary tract symptoms: Secondary | ICD-10-CM | POA: Diagnosis present

## 2024-02-24 DIAGNOSIS — R918 Other nonspecific abnormal finding of lung field: Secondary | ICD-10-CM | POA: Diagnosis not present

## 2024-02-24 DIAGNOSIS — S0990XA Unspecified injury of head, initial encounter: Secondary | ICD-10-CM | POA: Diagnosis not present

## 2024-02-24 DIAGNOSIS — I4510 Unspecified right bundle-branch block: Secondary | ICD-10-CM | POA: Diagnosis present

## 2024-02-24 DIAGNOSIS — R9082 White matter disease, unspecified: Secondary | ICD-10-CM | POA: Diagnosis not present

## 2024-02-24 DIAGNOSIS — Z4682 Encounter for fitting and adjustment of non-vascular catheter: Secondary | ICD-10-CM | POA: Diagnosis not present

## 2024-02-24 DIAGNOSIS — R55 Syncope and collapse: Secondary | ICD-10-CM | POA: Diagnosis not present

## 2024-02-24 DIAGNOSIS — Z789 Other specified health status: Secondary | ICD-10-CM

## 2024-02-24 DIAGNOSIS — R402142 Coma scale, eyes open, spontaneous, at arrival to emergency department: Secondary | ICD-10-CM | POA: Diagnosis present

## 2024-02-24 DIAGNOSIS — R0989 Other specified symptoms and signs involving the circulatory and respiratory systems: Secondary | ICD-10-CM | POA: Diagnosis not present

## 2024-02-24 DIAGNOSIS — R569 Unspecified convulsions: Secondary | ICD-10-CM | POA: Diagnosis not present

## 2024-02-24 DIAGNOSIS — Z8249 Family history of ischemic heart disease and other diseases of the circulatory system: Secondary | ICD-10-CM

## 2024-02-24 DIAGNOSIS — R9089 Other abnormal findings on diagnostic imaging of central nervous system: Secondary | ICD-10-CM | POA: Diagnosis not present

## 2024-02-24 DIAGNOSIS — M79604 Pain in right leg: Secondary | ICD-10-CM | POA: Diagnosis not present

## 2024-02-24 LAB — CBC
HCT: 33.7 % — ABNORMAL LOW (ref 39.0–52.0)
Hemoglobin: 11.1 g/dL — ABNORMAL LOW (ref 13.0–17.0)
MCH: 29.3 pg (ref 26.0–34.0)
MCHC: 32.9 g/dL (ref 30.0–36.0)
MCV: 88.9 fL (ref 80.0–100.0)
Platelets: 150 K/uL (ref 150–400)
RBC: 3.79 MIL/uL — ABNORMAL LOW (ref 4.22–5.81)
RDW: 16.9 % — ABNORMAL HIGH (ref 11.5–15.5)
WBC: 6.2 K/uL (ref 4.0–10.5)
nRBC: 0 % (ref 0.0–0.2)

## 2024-02-24 LAB — ETHANOL: Alcohol, Ethyl (B): <15 mg/dL (ref <15)

## 2024-02-24 LAB — COMPREHENSIVE METABOLIC PANEL WITH GFR
ALT: 5 U/L (ref 0–44)
AST: 15 U/L (ref 15–41)
Albumin: 3.1 g/dL — ABNORMAL LOW (ref 3.5–5.0)
Alkaline Phosphatase: 72 U/L (ref 38–126)
Anion gap: 13 (ref 5–15)
BUN: 7 mg/dL — ABNORMAL LOW (ref 8–23)
CO2: 26 mmol/L (ref 22–32)
Calcium: 8.9 mg/dL (ref 8.9–10.3)
Chloride: 94 mmol/L — ABNORMAL LOW (ref 98–111)
Creatinine, Ser: 0.63 mg/dL (ref 0.61–1.24)
GFR, Estimated: >60 mL/min (ref >60)
Glucose, Bld: 101 mg/dL — ABNORMAL HIGH (ref 70–99)
Potassium: 3.1 mmol/L — ABNORMAL LOW (ref 3.5–5.1)
Sodium: 133 mmol/L — ABNORMAL LOW (ref 135–145)
Total Bilirubin: 1.2 mg/dL (ref 0.0–1.2)
Total Protein: 5.5 g/dL — ABNORMAL LOW (ref 6.5–8.1)

## 2024-02-24 LAB — HEPARIN LEVEL (UNFRACTIONATED): Heparin Unfractionated: >1.1 [IU]/mL — ABNORMAL HIGH (ref 0.30–0.70)

## 2024-02-24 LAB — TYPE AND SCREEN
ABO/RH(D): A POS
Antibody Screen: NEGATIVE

## 2024-02-24 LAB — I-STAT CHEM 8, ED
BUN: 6 mg/dL — ABNORMAL LOW (ref 8–23)
Calcium, Ion: 1.1 mmol/L — ABNORMAL LOW (ref 1.15–1.40)
Chloride: 95 mmol/L — ABNORMAL LOW (ref 98–111)
Creatinine, Ser: 0.7 mg/dL (ref 0.61–1.24)
Glucose, Bld: 93 mg/dL (ref 70–99)
HCT: 33 % — ABNORMAL LOW (ref 39.0–52.0)
Hemoglobin: 11.2 g/dL — ABNORMAL LOW (ref 13.0–17.0)
Potassium: 3.1 mmol/L — ABNORMAL LOW (ref 3.5–5.1)
Sodium: 132 mmol/L — ABNORMAL LOW (ref 135–145)
TCO2: 24 mmol/L (ref 22–32)

## 2024-02-24 LAB — PROTIME-INR
INR: 1.4 — ABNORMAL HIGH (ref 0.8–1.2)
Prothrombin Time: 18.3 s — ABNORMAL HIGH (ref 11.4–15.2)

## 2024-02-24 LAB — GLUCOSE, CAPILLARY: Glucose-Capillary: 102 mg/dL — ABNORMAL HIGH (ref 70–99)

## 2024-02-24 LAB — I-STAT CG4 LACTIC ACID, ED: Lactic Acid, Venous: 0.9 mmol/L (ref 0.5–1.9)

## 2024-02-24 LAB — APTT: aPTT: 36 s (ref 24–36)

## 2024-02-24 MED ORDER — PROTHROMBIN COMPLEX CONC HUMAN 500 UNITS IV KIT
4660.0000 [IU] | PACK | Status: AC
  Administered 2024-02-24: 4660 [IU] via INTRAVENOUS
  Filled 2024-02-24: qty 4660

## 2024-02-24 MED ORDER — LEVOTHYROXINE SODIUM 25 MCG PO TABS
125.0000 ug | ORAL_TABLET | Freq: Every day | ORAL | Status: DC
  Administered 2024-02-26: 125 ug via ORAL
  Filled 2024-02-24 (×3): qty 1

## 2024-02-24 MED ORDER — BENZTROPINE MESYLATE 0.5 MG PO TABS
0.5000 mg | ORAL_TABLET | Freq: Two times a day (BID) | ORAL | Status: DC
  Administered 2024-02-25 – 2024-02-26 (×4): 0.5 mg via ORAL
  Filled 2024-02-24 (×7): qty 1

## 2024-02-24 MED ORDER — ATORVASTATIN CALCIUM 40 MG PO TABS
40.0000 mg | ORAL_TABLET | Freq: Every day | ORAL | Status: DC
  Administered 2024-02-25 – 2024-02-26 (×2): 40 mg via ORAL
  Filled 2024-02-24 (×2): qty 1

## 2024-02-24 MED ORDER — VALPROATE SODIUM 100 MG/ML IV SOLN
250.0000 mg | Freq: Three times a day (TID) | INTRAVENOUS | Status: DC
  Administered 2024-02-24 – 2024-02-25 (×2): 250 mg via INTRAVENOUS
  Filled 2024-02-24 (×6): qty 2.5

## 2024-02-24 MED ORDER — ACETAMINOPHEN 650 MG RE SUPP: 650.0000 mg | Freq: Four times a day (QID) | RECTAL | Status: DC | PRN

## 2024-02-24 MED ORDER — ACETAMINOPHEN 325 MG PO TABS: 650.0000 mg | ORAL_TABLET | Freq: Four times a day (QID) | ORAL | Status: DC | PRN

## 2024-02-24 MED ORDER — PERPHENAZINE 8 MG PO TABS
24.0000 mg | ORAL_TABLET | Freq: Every day | ORAL | Status: DC
  Filled 2024-02-24: qty 1

## 2024-02-24 MED ORDER — PHENELZINE SULFATE 15 MG PO TABS
45.0000 mg | ORAL_TABLET | Freq: Two times a day (BID) | ORAL | Status: DC
  Administered 2024-02-25: 15 mg via ORAL
  Administered 2024-02-25 – 2024-02-26 (×3): 45 mg via ORAL
  Filled 2024-02-24 (×8): qty 3

## 2024-02-24 MED ORDER — ALLOPURINOL 100 MG PO TABS
300.0000 mg | ORAL_TABLET | Freq: Every day | ORAL | Status: DC
  Administered 2024-02-25: 300 mg via ORAL
  Filled 2024-02-24: qty 3

## 2024-02-24 MED ORDER — DIVALPROEX SODIUM ER 500 MG PO TB24: 500.0000 mg | ORAL_TABLET | Freq: Every day | ORAL | Status: DC

## 2024-02-24 MED ORDER — DIVALPROEX SODIUM ER 250 MG PO TB24: 250.0000 mg | ORAL_TABLET | Freq: Every morning | ORAL | Status: DC

## 2024-02-24 MED ORDER — POTASSIUM CHLORIDE 10 MEQ/100ML IV SOLN
10.0000 meq | INTRAVENOUS | Status: DC
  Administered 2024-02-24 – 2024-02-25 (×3): 10 meq via INTRAVENOUS
  Filled 2024-02-24 (×3): qty 100

## 2024-02-24 NOTE — Plan of Care (Addendum)
 FMTS Brief Progress Note  S:Patient reports he is mentally hurting from his traumatic fall today. He is worried this will trigger depression. He does have some neck pain. Otherwise, he does not have any concerns about his function.    O: BP (!) 106/93 (BP Location: Right Arm)   Pulse 70   Temp 98.8 F (37.1 C) (Oral)   Resp 18   Ht 5' 9 (1.753 m)   Wt 87.3 kg   SpO2 96%   BMI 28.42 kg/m    General: Patient lying in bed with C spine collar CV: RRR, no mrg Pulm: CTAB, no increased WOB Abd: soft, nontender Neuro: symmetric 4/5 strength in SLR, plantarflexion, dorsiflexion, grip strength, biceps triceps; no pronator drift; sensation intact bilaterally; CN 2-12 intact; tremulous LE, pill rolling tremor R>L UE Psych: Appropriately oriented with waning alertness during exam  CT neck 02/24/24 Rads impression: 1. No CT evidence for acute osseous abnormality of the cervical spine. 2. Anterior fusion hardware C4 through C7. 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 Chronic degenerative changes and ossification of posterior longitudinal ligament as above.  CT head 02/24/24 Rads impression: 1. Small-volume acute subarachnoid hemorrhage along the medial right frontal lobe. 2. Acute subdural hemorrhage along the falx (measuring up to 4 mm in thickness). 3. Advanced cerebral atrophy. 4. Mild cerebral white matter disease, nonspecific but most often secondary to chronic small vessel ischemia.  A/P: Patient with subarachnoid and subdural hemorrhage after fall today. Clinically, his condition is unchanged from day team sign out. Will continue to monitor with neuro checks per RN q4h. - appreciate neurosurgery recommendations  - Will remove C spine collar as CT c Spine without fractures - Added CBG checks given T2DM history - SLP eval as he failed his bedside swallow - will further eval need for purewick, ?new urinary  incontinence vs inability to pee in urinal - Orders reviewed. Labs for AM CBC ordered.   Alena Morrison, Elio, MD 02/24/2024, 8:26 PM PGY-1, [facility] Night Resident  Please page [phone] with questions.

## 2024-02-24 NOTE — ED Triage Notes (Addendum)
 Pt BIB [facility] after fall with caregiver at Dr's office. Mechanical fall, hit head on concrete. On Eliquis  for a fib. A&O to self (baseline). Pt lives at [facility] [address] [facility]. Uses walker at baseline. No meds given by EMS. No bleeding noted by EMS, R abrasion on head. Pt reports R hip and leg pain. C collar in place by EMS  VS 120/72, 86 HR, 22 resp,. 95% RA

## 2024-02-24 NOTE — Assessment & Plan Note (Addendum)
 CT head showed small acute subarachnoid hemorrhage along medial right frontal lobe and acute subdural hemorrhage along the falx. Reversal agent initiated in ED. Neurosurgery consulted in ED.  - Admit to FMTS, Dr. Anders attending - Neurochecks every 4 hours - Repeat head CT in 24 hours unless clinically changes - Hold Eliquis  - NPO until swallow eval - PT/OT/SLP - SCDs

## 2024-02-24 NOTE — ED Provider Notes (Signed)
 [place] [facility] Provider Note   CSN: [id number] Arrival date & time: 02/24/24  1611     Patient presents with: No chief complaint on file.   [name] is a 63 y.o. male past medical history significant for dementia with baseline orientation to self only, atrial fibrillation on Eliquis , hypothyroidism, hypertension, hyperlipidemia who presents emergency department for mechanical fall.  History obtained from EMS.  EMS states that the patient was walking into a doctor's appointment with his caregiver when he tripped on his caregivers feet.  Patient had a mechanical fall and which he had a head strike however no loss of consciousness.  At the scene patient was complaining of right hip pain however no or other complaints.  On arrival patient denies any injury or pain at this time.   HPI     Prior to Admission medications   Medication Sig Start Date End Date Taking? Authorizing Provider  allopurinol  (ZYLOPRIM ) 300 MG tablet TAKE 1 TABLET BY MOUTH EVERY DAY 10/01/22   Malvina Ellen, MD  apixaban  (ELIQUIS ) 5 MG TABS tablet Take 1 tablet (5 mg total) by mouth 2 (two) times daily. 10/19/22   McDiarmid, Krystal BIRCH, MD  atorvastatin  (LIPITOR) 40 MG tablet TAKE 1 TABLET BY MOUTH EVERY DAY Patient taking differently: Take 40 mg by mouth at bedtime. 04/15/22   Lilland, Alana, DO  benztropine  (COGENTIN ) 0.5 MG tablet Take 1 tablet (0.5 mg total) by mouth 2 (two) times daily. 02/23/24   Howell Lunger, DO  divalproex  (DEPAKOTE  ER) 250 MG 24 hr tablet Take 1 tablet (250 mg total) by mouth every morning AND 2 tablets (500 mg total) at bedtime. Patient taking differently: Give 1 tablet (250mg ) by mouth every morning. 12/28/23   Whitfield Raisin, NP  divalproex  (DEPAKOTE ) 500 MG DR tablet Take 500 mg by mouth at bedtime.    [provider]  levothyroxine  (SYNTHROID ) 125 MCG tablet Take 1 tablet (125 mcg total) by mouth daily. 04/16/23   Dameron, Marisa, DO   omeprazole  (PRILOSEC) 40 MG capsule Take 1 capsule (40 mg total) by mouth daily. 07/13/23   Howell Lunger, DO  perphenazine  (TRILAFON ) 8 MG tablet Take 3 tablets (24 mg total) by mouth at bedtime. 12/02/23   Arfeen, Leni DASEN, MD  phenelzine  (NARDIL ) 15 MG tablet Take 3 tablets (45 mg total) by mouth 2 (two) times daily. 12/02/23   Arfeen, Syed T, MD  polyethylene glycol (MIRALAX  / GLYCOLAX ) 17 g packet Take 34 g by mouth daily. Patient taking differently: Take 17 g by mouth every 12 (twelve) hours as needed (constipation). 04/16/23   Dameron, Marisa, DO  Semaglutide ,0.25 or 0.5MG /DOS, 2 MG/1.5ML SOPN Inject 0.5 mg into the skin once a week. 0.25 mg once weekly for 4 weeks then increase to 0.5 mg weekly for at least 4 weeks,max 1 mg 02/11/24   Howell Lunger, DO  senna-docusate (SENOKOT-S) 8.6-50 MG tablet Take 1 tablet by mouth at bedtime. 04/16/23   Dameron, Marisa, DO    Allergies: Glucophage  [metformin ] and Lamictal  [lamotrigine ]    Review of Systems  Updated Vital Signs BP 119/85   Pulse 67   Temp 97.6 F (36.4 C) (Oral)   Resp 16   Ht 5' 10 (1.778 m)   Wt 88 kg   SpO2 92%   BMI 27.84 kg/m   Physical Exam Vitals reviewed.  Constitutional:      General: He is not in acute distress.    Appearance: He is not ill-appearing.  Interventions: Cervical collar in place.     Comments: Cervical collar in place upon patient arrival  HENT:     Head: Normocephalic and atraumatic.     Right Ear: External ear normal.     Left Ear: External ear normal.     Nose: No nasal deformity.     Mouth/Throat:     Mouth: No injury.  Eyes:     Pupils: Pupils are equal, round, and reactive to light.  Neck:     Trachea: Trachea normal.     Comments: Range of motion limited secondary to cervical spine collar Cardiovascular:     Rate and Rhythm: Normal rate.     Pulses:          Radial pulses are 2+ on the right side and 2+ on the left side.       Dorsalis pedis pulses are 2+ on the right side  and 2+ on the left side.     Heart sounds: Normal heart sounds.  Pulmonary:     Effort: Pulmonary effort is normal. No tachypnea.     Breath sounds: Normal breath sounds.  Chest:     Comments: No chest wall tenderness to anterior or lateral compression, no deformities or abrasions/laceration/ecchymosis Abdominal:     General: There is no distension.     Palpations: Abdomen is soft.     Tenderness: There is no abdominal tenderness.     Hernia: No hernia is present.     Comments: No evidence of acute injury including laceration, abrasion, ecchymosis  Musculoskeletal:     Cervical back: No spinous process tenderness or muscular tenderness.     Right lower leg: No edema.     Left lower leg: No edema.     Comments: Spontaneous movement of bilateral upper and lower extremities.  No obvious deformity, laceration, abrasion or ecchymosis.  No tenderness to palpation of pelvis which was stable to compression.  Bilateral lower extremities without leg length discrepancy or abnormal rotation  Skin:    General: Skin is warm.     Capillary Refill: Capillary refill takes less than 2 seconds.  Neurological:     Comments: Alert and oriented to self and place  Psychiatric:        Behavior: Behavior is cooperative.     (all labs ordered are listed, but only abnormal results are displayed) Labs Reviewed  COMPREHENSIVE METABOLIC PANEL WITH GFR - Abnormal; Notable for the following components:      Result Value   Sodium 133 (*)    Potassium 3.1 (*)    Chloride 94 (*)    Glucose, Bld 101 (*)    BUN 7 (*)    Total Protein 5.5 (*)    Albumin  3.1 (*)    All other components within normal limits  CBC - Abnormal; Notable for the following components:   RBC 3.79 (*)    Hemoglobin 11.1 (*)    HCT 33.7 (*)    RDW 16.9 (*)    All other components within normal limits  PROTIME-INR - Abnormal; Notable for the following components:   Prothrombin  Time 18.3 (*)    INR 1.4 (*)    All other components  within normal limits  I-STAT CHEM 8, ED - Abnormal; Notable for the following components:   Sodium 132 (*)    Potassium 3.1 (*)    Chloride 95 (*)    BUN 6 (*)    Calcium , Ion 1.10 (*)    Hemoglobin 11.2 (*)  HCT 33.0 (*)    All other components within normal limits  ETHANOL  APTT  HEPARIN  LEVEL (UNFRACTIONATED)  I-STAT CG4 LACTIC ACID, ED  TYPE AND SCREEN    EKG: None  Radiology: CT Head Wo Contrast Addendum Date: 02/24/2024 ADDENDUM REPORT: 02/24/2024 17:35 ADDENDUM: These results were called by telephone on 02/24/2024 at 5:16 pm to provider [facility] , who verbally acknowledged these results. Electronically Signed   By: Rockey Childs D.O.   On: 02/24/2024 17:35   Result Date: 02/24/2024 CLINICAL DATA:  Provided history: Head trauma, coagulopathy. EXAM: CT HEAD WITHOUT CONTRAST TECHNIQUE: Contiguous axial images were obtained from the base of the skull through the vertex without intravenous contrast. RADIATION DOSE REDUCTION: This exam was performed according to the departmental dose-optimization program which includes automated exposure control, adjustment of the mA and/or kV according to patient size and/or use of iterative reconstruction technique. COMPARISON:  Head CT 02/13/2024. FINDINGS: Brain: Advanced cerebral atrophy. Small-volume acute subarachnoid hemorrhage along the medial right frontal lobe. Acute subdural hemorrhage along the falx measuring up to 4 mm in thickness (for instance as seen on series 5, image 50). Mild patchy and ill-defined hypoattenuation within the cerebral white matter, nonspecific but most often secondary to chronic small vessel ischemia. No demarcated cortical infarct. No evidence of an intracranial mass. No midline shift or hydrocephalus. Vascular: No hyperdense vessel. Skull: No calvarial fracture or aggressive osseous lesion. Sinuses/Orbits: No mass or acute finding within the imaged orbits. Mild mucosal thickening within the left sphenoid sinus.  Traumatic Brain Injury Risk Stratification Skull Fracture: No - Low/mBIG 1 Subdural Hematoma (SDH): Yes Subarachnoid Hemorrhage [facility]): Yes Epidural Hematoma (EDH): No - Low/mBIG 1 Cerebral contusion, intra-axial, intraparenchymal Hemorrhage (IPH): No Intraventricular Hemorrhage (IVH): No - Low/mBIG 1 Midline Shift > 1mm or Edema/effacement of sulci/vents: No - Low/mBIG 1 ---------------------------------------------------- Attempts are being made to reach the ordering provider at this time. IMPRESSION: 1. Small-volume acute subarachnoid hemorrhage along the medial right frontal lobe. 2. Acute subdural hemorrhage along the falx (measuring up to 4 mm in thickness). 3. Advanced cerebral atrophy. 4. Mild cerebral white matter disease, nonspecific but most often secondary to chronic small vessel ischemia. Electronically Signed: By: Rockey Childs D.O. On: 02/24/2024 17:06   CT Cervical Spine Wo Contrast Result Date: 02/24/2024 CLINICAL DATA:  Mechanical fall EXAM: CT CERVICAL SPINE WITHOUT CONTRAST TECHNIQUE: Multidetector CT imaging of the cervical spine was performed without intravenous contrast. Multiplanar CT image reconstructions were also generated. RADIATION DOSE REDUCTION: This exam was performed according to the departmental dose-optimization program which includes automated exposure control, adjustment of the mA and/or kV according to patient size and/or use of iterative reconstruction technique. COMPARISON:  CT cervical spine 02/13/2024, 01/22/2024 FINDINGS: Alignment: Stable straightening of the cervical spine. Facet alignment is normal. Skull base and vertebrae: No acute fracture. No primary bone lesion or focal pathologic process. Soft tissues and spinal canal: No prevertebral fluid or swelling. No visible canal hematoma. Disc levels: Anterior fusion hardware C4 through C7. Diffuse ankylosis of the cervical and thoracic spine secondary to anterior fusion and dish type changes. Ossification of posterior  longitudinal ligament C2 through C4 and C5-C6. Bulky osteophytes at C2-C3. Facet ankylosis at multiple levels. Multilevel foraminal narrowing, worst at C6-C7. Upper chest: Negative. Other: None IMPRESSION: 1. No CT evidence for acute osseous abnormality of the cervical spine. 2. Anterior fusion hardware C4 through C7. 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 Chronic degenerative changes  and ossification of posterior longitudinal ligament as above. Electronically Signed   By: Luke Bun M.D.   On: 02/24/2024 17:24   DG Pelvis Portable Result Date: 02/24/2024 CLINICAL DATA:  Trauma. EXAM: PORTABLE PELVIS 1-2 VIEWS COMPARISON:  Radiograph dated 02/13/2024 FINDINGS: No acute fracture or dislocation. Moderate bilateral hip arthritic change. Similar appearance of osteophyte at L5-S1. The soft tissues are unremarkable. IMPRESSION: 1. No acute fracture or dislocation. 2. Moderate bilateral hip arthritic change. Electronically Signed   By: Vanetta Chou M.D.   On: 02/24/2024 16:55   DG Chest Port 1 View Result Date: 02/24/2024 CLINICAL DATA:  Trauma. EXAM: PORTABLE CHEST 1 VIEW COMPARISON:  Chest radiograph dated 02/13/2024. FINDINGS: No focal consolidation, pleural effusion pneumothorax. The cardiac silhouette is within limits. No acute osseous pathology. Cervical spine fusion hardware. IMPRESSION: No active disease. Electronically Signed   By: Vanetta Chou M.D.   On: 02/24/2024 16:52   DG Femur 1 View Right Result Date: 02/24/2024 CLINICAL DATA:  Fall and right lower extremity pain. EXAM: RIGHT FEMUR 1 VIEW COMPARISON:  Radiograph dated 02/09/2022. FINDINGS: No acute fracture or dislocation. The bones are mildly osteopenic. Mild arthritic changes of the right hip. The soft tissues are unremarkable. IMPRESSION: 1. No acute fracture or dislocation. 2. Mild arthritic changes of the right hip. Electronically Signed   By: Vanetta Chou M.D.    On: 02/24/2024 16:52     Procedures   Medications Ordered in the ED  prothrombin  complex conc human (KCENTRA ) IVPB 4,660 Units (4,660 Units Intravenous New Bag/Given 02/24/24 1744)    Clinical Course as of 02/24/24 1756  Thu Feb 24, 2024  1641 Chest x-ray reviewed by me: Trachea is midline without evidence of obvious pneumothorax, hemothorax, acute fracture, cardiomegaly pleural effusion or pulmonary consolidation.  Pelvis x-ray reviewed by me: Hips are located without evidence of obvious acute fracture or dislocation.  Pelvic ring intact.  Femur x-ray reviewed by me: No evidence of acute fracture or dislocation [AG]  1641 EKG reviewed by me: Atrial fibrillation without evidence of acute ischemic change [AG]  1710 CT Head Wo Contrast Small volume subarachnoid hemorrhage with associated subdural approximately 4 mm in thickness [AG]  1710 NSG consulted 2/2 mBIG3 with SAH and SDH [AG]  1724 NSG w no acute surgical intervention. Recommend discontinuing Eliquis  in the setting of recurrent falls with ICH.  [AG]  1730 FM consult placed [AG]    Clinical Course User Index [AG] Nada Chroman, DO                                 Medical Decision Making Amount and/or Complexity of Data Reviewed Labs: ordered. Radiology: ordered. Decision-making details documented in ED Course.  Risk Decision regarding hospitalization.   On initial evaluation patient's airway is intact, bilateral breath sounds present and patient has strong palpable distal pulses.  Patient is hemodynamically stable with SBP 120s and does not tachycardic.  Primary exam performed without need for acute intervention.  Chest and pelvis x-ray obtained without evidence of acute pneumothorax, hemothorax, obvious fracture or dislocation and no need for acute intervention.  Secondary exam performed without evidence of obvious injury.  Based upon patient's mechanism of injury and head strike on Eliquis  will obtain CT head and cervical  spine.  Will obtain right femur x-ray as EMS stated that patient endorsed right hip pain.  X-ray imaging reviewed by me without evidence of acute fracture or dislocation.  On evaluation of CT head imaging radiology reported ICH with acute subdural and subarachnoid hemorrhage.  Neurosurgery consulted with no acute surgical intervention planned.  Neurosurgery did state that as the patient has had recurrent falls and now has ICH, they recommend that patient's Eliquis  be discontinued.  Will give Kcentra  for reversal as patient has ICH with known Eliquis  use.  Patient has no other injuries noted from mechanical fall and laboratory studies are not significant for acute anemia, leukocytosis, lactic acidosis or severe electrolyte abnormalities.  Patient will be admitted to a family medicine for continued care and management.      Final diagnoses:  Subdural hemorrhage (HCC)  Subarachnoid hemorrhage (HCC)  Fall, initial encounter    ED Discharge Orders     None       Lavanda Bolster DO Emergency Medicine PGY2    Bolster Lavanda, DO 02/24/24 1756    Dreama Longs, MD 02/25/24 [phone]

## 2024-02-24 NOTE — Telephone Encounter (Signed)
 D:  Upon arriving for pt's appt to see Dr. Curry, there was a fall in the front entrance of the building.  A:  Case mgr called [phone].  EMS took pt to [facility].  Dr. Curry, Elouise Birmingham, RNC and Olivia Sample, LPN were informed.  Safety Zone Portal was put in.

## 2024-02-24 NOTE — Assessment & Plan Note (Deleted)
 Recurrent falls

## 2024-02-24 NOTE — ED Notes (Signed)
 CCMD called.

## 2024-02-24 NOTE — ED Notes (Signed)
 Trauma Response Nurse Documentation  [name] is a 63 y.o. male arriving to [facility] ED via EMS  On Eliquis  (apixaban ) daily. Trauma was activated as a Level 2 based on the following trauma criteria Elderly patients > 65 with head trauma on anti-coagulation (excluding ASA).  Patient cleared for CT by Dr. Dreama. Pt transported to CT with trauma response nurse present to monitor. RN remained with the patient throughout their absence from the department for clinical observation.   GCS 14, which is baseline.  History   Past Medical History:  Diagnosis Date   A-fib (HCC)    Acute right-sided low back pain    Anxiety    Arrhythmia    Barrett esophagus    Chronic a-fib (HCC)    Closed fracture of left distal radius    Contact dermatitis 04/16/2017   Depression    Depression    Hypothyroidism    Low back pain    Morbid obesity, BMI unknown (HCC)    Obesity    Osteoarthritis    oa in bilateral knees   Schizophrenia (HCC)    Seizures (HCC)    Seizures (HCC)    Thyroid  disease    Varicose veins      Past Surgical History:  Procedure Laterality Date   ACHILLES TENDON REPAIR  approx. 2004   right foot   ANTERIOR CERVICAL DECOMP/DISCECTOMY FUSION N/A 04/08/2023   Procedure: Anterior Cervical Discectomy Fusion Cervical Five-Cervical Six;  Surgeon: Lanis Pupa, MD;  Location: [facility] OR;  Service: Neurosurgery;  Laterality: N/A;   CATARACT EXTRACTION     bilateral   OPEN REDUCTION INTERNAL FIXATION (ORIF) DISTAL RADIAL FRACTURE Left 07/21/2018   Procedure: OPEN REDUCTION INTERNAL FIXATION (ORIF) DISTAL RADIAL FRACTURE;  Surgeon: Murrell Drivers, MD;  Location: [facility];  Service: Orthopedics;  Laterality: Left;   stab phlebectomy  Right 11/09/2016   stab phlebectomy R leg by Lynwood Collum MD   TOOTH EXTRACTION       Initial Focused Assessment (If applicable, or please see trauma documentation): Patient alert to name but otherwise disoriented, GCS 14  (baseline), PERR 4 Airway intact, bilateral breath sounds Pulses 2+  CT's Completed:   CT Head and CT C-Spine   Interventions:  CXR/PXR/R femur Labs CT Head/Cspine IV, labs Kcentra  for reversal  Plan for disposition:  Admission to floor   Consults completed:  Neurosurgeon at see notes.  Event Summary: Patient to ED after a fall at his doctors office where he got tangled with his caregiver. Hit his head on the concrete, Eliquis  for Afib. Patient is from [facility]. Imaging was ordered and revealed an acute SDH/SAH. Neurosurgery was consulted and recommends reversal and admission for obs. Kcentra  administered. Patient admitted to 3W for observation.  Bedside handoff with ED RN [name].    [name]  Trauma Response RN  Please call TRN at [phone] for further assistance.

## 2024-02-24 NOTE — Telephone Encounter (Addendum)
 Pt's mother called needing to speak to a nurse regarding the pt's recent falls and current hosp visit. Please advise.

## 2024-02-24 NOTE — ED Notes (Signed)
 Phlebotomy at bedside.

## 2024-02-24 NOTE — ED Notes (Signed)
 This RN flushing KCENTRA  through with 200 mL NS per [facility]

## 2024-02-24 NOTE — ED Notes (Signed)
 Alan TRN transporting patient to CT 4

## 2024-02-24 NOTE — H&P (Signed)
 Hospital Admission History and Physical Service Pager: [phone]  Patient name: [name] Medical record number: [id number] Date of Birth: [birth year] Age: 63 y.o. Gender: male  Primary Care Provider: Howell Lunger, DO Consultants: Neurosurgery Code Status: Full by default, need to confirm with mother Preferred Emergency Contact:  Contact Information     Name Relation Home Work Mobile   [name] Mother [phone]  [phone]      Other Contacts   None on File     Chief Complaint: fall  Assessment and Plan: [name] is a 63 y.o. male presenting with subdural and subarachnoid hemorrhage after a fall from standing due to tripping over someone's foot.  Neurosurgery consulted in the ED and did not see indication for surgical intervention at this time.  Will admit for observation and repeat head CT in 24 hours Assessment & Plan Subdural hemorrhage (HCC) CT head showed small acute subarachnoid hemorrhage along medial right frontal lobe and acute subdural hemorrhage along the falx. Reversal agent initiated in ED. Neurosurgery consulted in ED.  - Admit to FMTS, Dr. Anders attending - Neurochecks every 4 hours - Repeat head CT in 24 hours unless clinically changes - Hold Eliquis  - NPO until swallow eval - PT/OT/SLP - SCDs  Hypothyroidism-continue Synthroid  Schizophrenia-continue benztropine , perphenazine , phenelzine  History of seizures-continue Depakote  Hyperlipidemia-continue atorvastatin   FEN/GI: NPO until passes bedside swallow VTE Prophylaxis: SCD  Disposition: progressive  History of Present Illness:  [name] is a 63 y.o. male w/PMHx of dementia, Parkinson's Afib on Eliquis , hypothyroidism, HTN, HLD presenting with after fall with head strike. The patient was walking into his doctors office when he tripped over his caregivers feet and fell, hitting his head on the ground. No LOC. Patient denies pain, HA, vision changes.   In the ED, patient  was alert and oriented to self and place. He arrived with a c-collar. CXR, pelvis xray, and femur xray unremarkable. CT head w/o contrast significant for small volume SAH w/associated subdural ~55mm in thickness.  Neurosurgery consulted in the ED.  EKG significant for atrial fibrillation.   Review Of Systems: Per HPI with the following additions: none  Pertinent Past Medical History: Afib on Eliquis  Anxiety Barrett esophagus Depression Hypothyroidism OA Schizophrenia Seizures  Remainder reviewed in history tab.   Pertinent Past Surgical History: Achilles tendon repair Anterior cervical decomp/discectomy fusion ORIF L distal radial fracture  Remainder reviewed in history tab.   Pertinent Social History: Tobacco use: No Alcohol use: no Other Substance use: no Lives at [facility]  Pertinent Family History: N/A  Remainder reviewed in history tab.   Important Outpatient Medications: Allopurinol  300 mg daily Eliquis  5 mg twice daily Lipitor 40 mg daily Benztropine  1 tablet twice daily Depakote  250 mg every morning Synthroid  125 mcg daily Prilosec 40 mg daily Perphenazine  24mg  daily Phenelzine  45mg  daily  Remainder reviewed in medication history.   Objective: BP 119/85   Pulse 67   Temp 97.6 F (36.4 C) (Oral)   Resp 16   Ht 5' 10 (1.778 m)   Wt 88 kg   SpO2 92%   BMI 27.84 kg/m  Exam: General: NAD, in c-collar Cardiovascular: RRR, no m/r/g Respiratory: normal work of breathing on RA, CTAB Abdomen: Normal bowel sounds, soft, non-tender Extremities: No swelling BLE Neuro: Symmetric grip strength bilateral upper extremities.  Symmetric strength bilateral lower extremities.  Sensation intact. CN2: no vision changes CN3,4,6: PERRLA. EOMI CN5: Sensation intact BL CN7: Facial expressions symmetric CN8: Hearing intact BL  CN9: palate symmetric  CN10: regular speech CN11: shrugs shoulders against resistance CN12: tongue midline  Labs:  CBC  BMET  Recent Labs  Lab 02/24/24 1638 02/24/24 1655  WBC 6.2  --   HGB 11.1* 11.2*  HCT 33.7* 33.0*  PLT 150  --    Recent Labs  Lab 02/24/24 1638 02/24/24 1655  NA 133* 132*  K 3.1* 3.1*  CL 94* 95*  CO2 26  --   BUN 7* 6*  CREATININE 0.63 0.70  GLUCOSE 101* 93  CALCIUM  8.9  --     Ethanol <15 Pt 18.3, INR 1.4 aPTT 35 Lactic acid 0.9  EKG: Afib 80 BPM, No STT changes   Imaging Studies Performed:  CT Head WO Contrast 02/24/24 IMPRESSION: 1. Small-volume acute subarachnoid hemorrhage along the medial right frontal lobe. 2. Acute subdural hemorrhage along the falx (measuring up to 4 mm in thickness). 3. Advanced cerebral atrophy. 4. Mild cerebral white matter disease, nonspecific but most often secondary to chronic small vessel ischemia.  CT C-spine 02/24/24 IMPRESSION: 1. No CT evidence for acute osseous abnormality of the cervical spine. 2. Anterior fusion hardware C4 through C7. 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 Chronic degenerative changes and ossification of posterior longitudinal ligament as above.  XR Pelvis 02/24/24 IMPRESSION: 1. No acute fracture or dislocation. 2. Moderate bilateral hip arthritic change.   CXR 02/24/24 IMPRESSION: No active disease.  R Femur 02/24/24 IMPRESSION: 1. No acute fracture or dislocation. 2. Mild arthritic changes of the right hip.  [name], Elyce, DO 02/24/2024, 6:27 PM PGY-2, [facility]  FPTS Intern pager: [phone], text pages welcome Secure chat group [facility] Teaching Service

## 2024-02-24 NOTE — Telephone Encounter (Signed)
 Pt observed falling outside office while coming in for appointment. Caretaker was with pt and did break his fall however she stated that he did hit his head. Not bleeding, cuts or abrasions noted. EMS was called and decided to transport pt to [facility] on [facility]. Mother was notified and stated that he had a fall this morning as well and has been repeatedly falling off his bed at ALF. Dr. Curry spoke with pt's mother to advise pt needs higher level of care (neurology f/u) as psychiatrically pt is stable and psychotropics may be managed by ALF provider. Mother verbalizes understanding. We will f/u with ALF as needed.

## 2024-02-25 ENCOUNTER — Encounter (HOSPITAL_COMMUNITY): Payer: Self-pay | Admitting: Family Medicine

## 2024-02-25 ENCOUNTER — Inpatient Hospital Stay (HOSPITAL_COMMUNITY)

## 2024-02-25 DIAGNOSIS — R296 Repeated falls: Secondary | ICD-10-CM | POA: Diagnosis not present

## 2024-02-25 DIAGNOSIS — G319 Degenerative disease of nervous system, unspecified: Secondary | ICD-10-CM | POA: Insufficient documentation

## 2024-02-25 DIAGNOSIS — S066XAA Traumatic subarachnoid hemorrhage with loss of consciousness status unknown, initial encounter: Secondary | ICD-10-CM | POA: Diagnosis present

## 2024-02-25 DIAGNOSIS — R339 Retention of urine, unspecified: Secondary | ICD-10-CM | POA: Insufficient documentation

## 2024-02-25 DIAGNOSIS — I4819 Other persistent atrial fibrillation: Secondary | ICD-10-CM

## 2024-02-25 DIAGNOSIS — S065X0A Traumatic subdural hemorrhage without loss of consciousness, initial encounter: Secondary | ICD-10-CM | POA: Diagnosis not present

## 2024-02-25 DIAGNOSIS — S066X0A Traumatic subarachnoid hemorrhage without loss of consciousness, initial encounter: Secondary | ICD-10-CM | POA: Diagnosis not present

## 2024-02-25 DIAGNOSIS — R39198 Other difficulties with micturition: Secondary | ICD-10-CM | POA: Diagnosis present

## 2024-02-25 LAB — URINALYSIS, ROUTINE W REFLEX MICROSCOPIC
Bacteria, UA: NONE SEEN
Bilirubin Urine: NEGATIVE
Glucose, UA: NEGATIVE mg/dL
Ketones, ur: 5 mg/dL — AB
Leukocytes,Ua: NEGATIVE
Nitrite: NEGATIVE
Protein, ur: NEGATIVE mg/dL
Specific Gravity, Urine: 1.02 (ref 1.005–1.030)
pH: 6 (ref 5.0–8.0)

## 2024-02-25 LAB — GLUCOSE, CAPILLARY
Glucose-Capillary: 97 mg/dL (ref 70–99)
Glucose-Capillary: 107 mg/dL — ABNORMAL HIGH (ref 70–99)
Glucose-Capillary: 137 mg/dL — ABNORMAL HIGH (ref 70–99)

## 2024-02-25 LAB — BASIC METABOLIC PANEL WITH GFR
Anion gap: 10 (ref 5–15)
BUN: 6 mg/dL — ABNORMAL LOW (ref 8–23)
CO2: 22 mmol/L (ref 22–32)
Calcium: 8.8 mg/dL — ABNORMAL LOW (ref 8.9–10.3)
Chloride: 99 mmol/L (ref 98–111)
Creatinine, Ser: 0.64 mg/dL (ref 0.61–1.24)
GFR, Estimated: >60 mL/min (ref >60)
Glucose, Bld: 79 mg/dL (ref 70–99)
Potassium: 3.8 mmol/L (ref 3.5–5.1)
Sodium: 131 mmol/L — ABNORMAL LOW (ref 135–145)

## 2024-02-25 LAB — CBC
HCT: 32 % — ABNORMAL LOW (ref 39.0–52.0)
Hemoglobin: 10.7 g/dL — ABNORMAL LOW (ref 13.0–17.0)
MCH: 29.2 pg (ref 26.0–34.0)
MCHC: 33.4 g/dL (ref 30.0–36.0)
MCV: 87.4 fL (ref 80.0–100.0)
Platelets: 133 K/uL — ABNORMAL LOW (ref 150–400)
RBC: 3.66 MIL/uL — ABNORMAL LOW (ref 4.22–5.81)
RDW: 16.8 % — ABNORMAL HIGH (ref 11.5–15.5)
WBC: 6.1 K/uL (ref 4.0–10.5)
nRBC: 0 % (ref 0.0–0.2)

## 2024-02-25 LAB — MAGNESIUM: Magnesium: 1.5 mg/dL — ABNORMAL LOW (ref 1.7–2.4)

## 2024-02-25 LAB — VALPROIC ACID LEVEL: Valproic Acid Lvl: 86 ug/mL (ref 50–100)

## 2024-02-25 MED ORDER — LACTATED RINGERS IV BOLUS
500.0000 mL | Freq: Once | INTRAVENOUS | Status: AC
  Administered 2024-02-25: 500 mL via INTRAVENOUS

## 2024-02-25 MED ORDER — DIVALPROEX SODIUM 125 MG PO CSDR
250.0000 mg | DELAYED_RELEASE_CAPSULE | Freq: Three times a day (TID) | ORAL | Status: DC
  Administered 2024-02-25 – 2024-02-26 (×5): 250 mg via ORAL
  Filled 2024-02-25 (×8): qty 2

## 2024-02-25 MED ORDER — CHLORHEXIDINE GLUCONATE CLOTH 2 % EX PADS
6.0000 | MEDICATED_PAD | Freq: Every day | CUTANEOUS | Status: DC
  Administered 2024-02-25 – 2024-02-29 (×5): 6 via TOPICAL

## 2024-02-25 MED ORDER — ORAL CARE MOUTH RINSE: 15.0000 mL | OROMUCOSAL | Status: DC | PRN

## 2024-02-25 MED ORDER — POTASSIUM CHLORIDE 10 MEQ/100ML IV SOLN
10.0000 meq | INTRAVENOUS | Status: AC
  Administered 2024-02-25: 10 meq via INTRAVENOUS
  Filled 2024-02-25: qty 100

## 2024-02-25 MED ORDER — PERPHENAZINE 4 MG PO TABS
24.0000 mg | ORAL_TABLET | Freq: Every day | ORAL | Status: DC
  Administered 2024-02-25 – 2024-02-26 (×2): 24 mg via ORAL
  Filled 2024-02-25 (×2): qty 6

## 2024-02-25 MED ORDER — ORAL CARE MOUTH RINSE
15.0000 mL | OROMUCOSAL | Status: DC
  Administered 2024-02-25 – 2024-03-01 (×18): 15 mL via OROMUCOSAL

## 2024-02-25 MED ORDER — MAGNESIUM SULFATE 2 GM/50ML IV SOLN
2.0000 g | Freq: Once | INTRAVENOUS | Status: AC
  Administered 2024-02-25: 2 g via INTRAVENOUS
  Filled 2024-02-25: qty 50

## 2024-02-25 MED ORDER — DOXAZOSIN MESYLATE 1 MG PO TABS
1.0000 mg | ORAL_TABLET | Freq: Every day | ORAL | Status: DC
  Administered 2024-02-25 – 2024-02-26 (×2): 1 mg via ORAL
  Filled 2024-02-25 (×2): qty 1

## 2024-02-25 MED ORDER — POTASSIUM CHLORIDE 10 MEQ/100ML IV SOLN: 10.0000 meq | INTRAVENOUS | Status: DC

## 2024-02-25 MED ORDER — TAMSULOSIN HCL 0.4 MG PO CAPS
0.4000 mg | ORAL_CAPSULE | Freq: Every day | ORAL | Status: DC
  Administered 2024-02-25: 0.4 mg via ORAL
  Filled 2024-02-25: qty 1

## 2024-02-25 NOTE — Assessment & Plan Note (Addendum)
 New onset. Currently on purewick. Difficulty with urinary cath. Initial bladder scan 408cc. Repeat bladder scan 383cc this AM. -Coude cath -Urology consult if unable to coude cath

## 2024-02-25 NOTE — Evaluation (Signed)
 Physical Therapy Evaluation Patient Details Name: [name] MRN: [id number] DOB: May 19, 1961 Today's Date: 02/25/2024  History of Present Illness  63 y.o. male presents to [facility] 02/24/24 after falling with SDH and SAH. PMHx of dementia, Parkinson's Afib on Eliquis , hypothyroidism, HTN, HLD, seizures, OA, schizophrenia  Clinical Impression  Pt in bed upon arrival and agreeable to PT eval. Pt lives at [facility] and has assist for all mobility with use of RW. Pt also mentioned occasionally using manual WC for mobility. Pt presents with generalized weakness, poor seated/dynamic balance, decreased activity tolerance, difficulty motor planning, and decreased safety awareness. Once seated on EOB, pt would occasionally lean posteriorly with LE's sliding anteriorly. MaxA needed to assist with anterior lean with brief moments of CGA. When RW was placed in front of pt, he became retropulsive with inability to safely stand. Able to stand with MinAx2 and [facility] with pt able to take steps forwards/backwards/sideways. Increased difficulty stepping with RW with pt taking hands of RW handles with difficulty motor planning. Would recommend nursing staff use Stedy for mobility. At this time, recommending <3hrs post acute rehab to improve mobility and  quality of life. Pt would benefit from acute skilled PT with current functional limitations listed below (see PT Problem List). Acute PT to follow.         If plan is discharge home, recommend the following: A lot of help with walking and/or transfers;A lot of help with bathing/dressing/bathroom;Assistance with cooking/housework;Direct supervision/assist for medications management;Direct supervision/assist for financial management;Assist for transportation;Help with stairs or ramp for entrance   Can travel by private vehicle   No    Equipment Recommendations None recommended by PT     Functional Status Assessment Patient has had a recent decline in their  functional status and demonstrates the ability to make significant improvements in function in a reasonable and predictable amount of time.     Precautions / Restrictions Precautions Precautions: Fall Recall of Precautions/Restrictions: Impaired Restrictions Weight Bearing Restrictions Per Provider Order: No      Mobility  Bed Mobility Overal bed mobility: Needs Assistance Bed Mobility: Rolling, Sidelying to Sit, Sit to Supine Rolling: Mod assist, Used rails Sidelying to sit: Mod assist, HOB elevated, Used rails   Sit to supine: Mod assist, +2 for physical assistance, +2 for safety/equipment, HOB elevated, Used rails   General bed mobility comments: able to roll with ModA and cues to reach for bed rail. Assist to bring LE's off EOB and slight assist to raise trunk. Step by step cueing with difficulty sequencing    Transfers Overall transfer level: Needs assistance Equipment used: 2 person hand held assist Transfers: Sit to/from Stand Sit to Stand: Min assist, +2 physical assistance, +2 safety/equipment    General transfer comment: With RW placed in front of pt, pt became retropulsive with difficulty keeping feet flat. Stood with [facility] and MinAx2    Ambulation/Gait Ambulation/Gait assistance: Min assist, +2 safety/equipment Gait Distance (Feet): 4 Feet Assistive device: Rolling walker (2 wheels), 2 person hand held assist Gait Pattern/deviations: Step-through pattern, Decreased stride length, Trunk flexed, Shuffle Gait velocity: decr     General Gait Details: Able to take steps forwards/backwards and sideways with MinAx2 for safety. Difficulty motor planning when taking steps while using RW. Noted fatigue in legs and arms with proglonged time in standing, resting tremors in UE/LE. Tendency for L foot to rest in eversion       Balance Overall balance assessment: Needs assistance, Mild deficits observed, not formally tested, History  of Falls Sitting-balance support: No upper  extremity supported, Feet supported Sitting balance-Leahy Scale: Poor Sitting balance - Comments: CGA to MaxA with pt occasionally leaning posteriorly with LE's sliding anteriorly Postural control: Posterior lean Standing balance support: Bilateral upper extremity supported, During functional activity, Reliant on assistive device for balance Standing balance-Leahy Scale: Poor Standing balance comment: reliant on UE and external support        Pertinent Vitals/Pain Pain Assessment Pain Assessment: Faces Faces Pain Scale: Hurts even more Pain Location: neck Pain Descriptors / Indicators: Aching, Discomfort Pain Intervention(s): Limited activity within patient's tolerance, Monitored during session, Repositioned    Home Living Family/patient expects to be discharged to:: Assisted living Living Arrangements: Alone Available Help at Discharge: Other (Comment) (Unknown, pt able to report) Type of Home: Other(Comment) (Assisted living)    Home Equipment: Cane - single point;Wheelchair - manual;BSC/3in1;Grab bars - toilet;Grab bars - tub/shower;Rolling Environmental consultant (2 wheels) Additional Comments: Lives at [facility]    Prior Function Prior Level of Function : Needs assist Mobility Comments: assist for bed mobility and to ambulate with RW. Occasionally uses WC ADLs Comments: staff close by for bathing, uses shower seat. Reports using bed pan     Extremity/Trunk Assessment   Upper Extremity Assessment Upper Extremity Assessment: Defer to OT evaluation    Lower Extremity Assessment Lower Extremity Assessment: Generalized weakness       Communication   Communication Communication: No apparent difficulties    Cognition Arousal: Alert Behavior During Therapy: WFL for tasks assessed/performed   PT - Cognitive impairments: No family/caregiver present to determine baseline, Orientation   Orientation impairments: Time, Situation    Following commands: Impaired Following commands  impaired: Follows one step commands inconsistently, Follows one step commands with increased time     Cueing Cueing Techniques: Verbal cues, Tactile cues, Visual cues, Gestural cues     General Comments General comments (skin integrity, edema, etc.): VSS on RA     PT Assessment Patient needs continued PT services  PT Problem List Decreased strength;Decreased activity tolerance;Decreased balance;Decreased mobility;Decreased cognition;Decreased knowledge of use of DME;Decreased safety awareness;Decreased knowledge of precautions       PT Treatment Interventions DME instruction;Gait training;Functional mobility training;Therapeutic exercise;Therapeutic activities;Balance training;Neuromuscular re-education;Patient/family education    PT Goals (Current goals can be found in the Care Plan section)  Acute Rehab PT Goals Patient Stated Goal: to be able to walk PT Goal Formulation: With patient Time For Goal Achievement: 03/10/24 Potential to Achieve Goals: Good    Frequency Min 2X/week     Co-evaluation   Reason for Co-Treatment: Necessary to address cognition/behavior during functional activity;For patient/therapist safety;To address functional/ADL transfers PT goals addressed during session: Balance;Mobility/safety with mobility;Proper use of DME         AM-PAC PT 6 Clicks Mobility  Outcome Measure Help needed turning from your back to your side while in a flat bed without using bedrails?: A Lot Help needed moving from lying on your back to sitting on the side of a flat bed without using bedrails?: A Lot Help needed moving to and from a bed to a chair (including a wheelchair)?: Total Help needed standing up from a chair using your arms (e.g., wheelchair or bedside chair)?: Total Help needed to walk in hospital room?: Total Help needed climbing 3-5 steps with a railing? : Total 6 Click Score: 8    End of Session Equipment Utilized During Treatment: Gait belt Activity  Tolerance: Patient tolerated treatment well Patient left: in bed;with call bell/phone within reach;with  bed alarm set;with nursing/sitter in room Nurse Communication: Mobility status;Need for lift equipment PT Visit Diagnosis: Unsteadiness on feet (R26.81);Other abnormalities of gait and mobility (R26.89);Muscle weakness (generalized) (M62.81);History of falling (Z91.81)    Time: 8966-8897 PT Time Calculation (min) (ACUTE ONLY): 29 min   Charges:   PT Evaluation $PT Eval Moderate Complexity: 1 Mod   PT General Charges $$ ACUTE PT VISIT: 1 Visit       Kate ORN, PT, DPT Secure Chat Preferred  Rehab Office [phone]   Kate BRAVO Wendolyn 02/25/2024, 11:44 AM

## 2024-02-25 NOTE — Assessment & Plan Note (Addendum)
 Hypothyroidism-continue Synthroid  Schizophrenia-continue benztropine , perphenazine , phenelzine  History of seizures-continue Depakote  (currently IV until able to PO) Hyperlipidemia-continue atorvastatin  DM- Keep patient off of ozempic  outpatient

## 2024-02-25 NOTE — Plan of Care (Signed)
 Went to bedside to speak with patient's brother and sister-in-law.  I updated his medical status.  Pending MRI.  I reported I would get the more from patient once I know his disposition.  Ultimately has his primary care physician I have seen him decline rapidly over the past 6 months.  This is likely related to frequent falls, and his dementia.  From a dementia perspective, I think he is overall safe to himself and others and he is not combative-he could probably participate in PT/OT without much issue and I think pursuing PT/OT at skilled nursing facility is a good option for him to get stronger. Discussed we will hold anticoagulation indefinitely given his recurrent falls and now twice brain bleed.  Discussed we will stop Ozempic  indefinitely given his poor appetite.  Will pursue other diabetes medications outpatient, possibly metformin .

## 2024-02-25 NOTE — TOC PASRR Note (Signed)
 30 Day PASRR Note   Patient Details  Name: [name] Date of Birth: 11-23-1960   Transition of Care [facility]) CM/SW Contact:    Almarie CHRISTELLA Goodie, LCSW Phone Number: 02/25/2024, 3:57 PM  To Whom It May Concern:  Please be advised that this patient will require a short-term nursing home stay - anticipated 30 days or less for rehabilitation and strengthening.   The plan is for return home.     TOC Dementia Note   Patient Details  Name: [name] Date of Birth: [birth year] 02/25/2024, 3:57 PM   To Whom It May Concern:  Please be advised that the above-named patient has a primary diagnosis of dementia which supersedes any psychiatric diagnosis.   Transition of Care [facility]) CM/SW Contact: Almarie CHRISTELLA Goodie, LCSW Phone Number: 02/25/2024, 3:57 PM

## 2024-02-25 NOTE — Evaluation (Signed)
 Clinical/Bedside Swallow Evaluation Patient Details  Name: [name] MRN: [id number] Date of Birth: 25-Sep-1960  Today's Date: 02/25/2024 Time: SLP Start Time (ACUTE ONLY): 0910 SLP Stop Time (ACUTE ONLY): 0935 SLP Time Calculation (min) (ACUTE ONLY): 25 min  Past Medical History:  Past Medical History:  Diagnosis Date   A-fib (HCC)    Acute right-sided low back pain    Anxiety    Arrhythmia    Barrett esophagus    Chronic a-fib (HCC)    Closed fracture of left distal radius    Contact dermatitis 04/16/2017   Depression    Depression    Disorientation 04/06/2023   Hypothyroidism    Low back pain    Morbid obesity, BMI unknown (HCC)    Obesity    Osteoarthritis    oa in bilateral knees   Schizophrenia (HCC)    Seizures (HCC)    Seizures (HCC)    Thyroid  disease    Varicose veins    Vertigo 11/03/2019   Past Surgical History:  Past Surgical History:  Procedure Laterality Date   ACHILLES TENDON REPAIR  approx. 2004   right foot   ANTERIOR CERVICAL DECOMP/DISCECTOMY FUSION N/A 04/08/2023   Procedure: Anterior Cervical Discectomy Fusion Cervical Five-Cervical Six;  Surgeon: Lanis Pupa, MD;  Location: [facility] OR;  Service: Neurosurgery;  Laterality: N/A;   CATARACT EXTRACTION     bilateral   OPEN REDUCTION INTERNAL FIXATION (ORIF) DISTAL RADIAL FRACTURE Left 07/21/2018   Procedure: OPEN REDUCTION INTERNAL FIXATION (ORIF) DISTAL RADIAL FRACTURE;  Surgeon: Murrell Drivers, MD;  Location: [facility];  Service: Orthopedics;  Laterality: Left;   stab phlebectomy  Right 11/09/2016   stab phlebectomy R leg by Lynwood Collum MD   TOOTH EXTRACTION     HPI:  [name] is a 63 y.o. male w/PMHx of Parkinson's Afib on Eliquis , hypothyroidism, HTN, HLD, barret esophagus, OA, Seizures, schizophrenia, and frontotemporal dementia. presenting with after fall with head strike. The patient was walking into his doctors office when he tripped over his caregivers feet and  fell, hitting his head on the ground. No LOC. Patient denies pain, HA, vision changes. In the ED, patient was alert and oriented to self and place. CT head showed small acute subarachnoid hemorrhage along medial right frontal lobe and acute subdural hemorrhage along the falx. The pt did not pass the yale, with no coughing but stopping partially through the 3 oz.    Assessment / Plan / Recommendation  Clinical Impression  Pt seen for skilled ST services for PO readiness. Pt is currently NPO and was assessed with thin liquid, mildly thick liquid, puree, and regular solids. DO in the room at the time of eval, he supports that the pt had some cervical damage during his fall and has had a stark decline cognitively over the last 6 months. The pt's voice was slightly garbled and the pt was primarily breathing through his oral cavity t/o the eval. He also has a tremor which makes self-feeding difficult. The pt's OME was limited due to inconsistent direction following - the pt had some labial and lingual weakness but was symmetrical. The pt had extensive dried secretions- oral care provided by SLP. The pt consumed thin liquid and mildly thick liquid with no overt s/s of aspiration- however he had oral holding and multiple swallows with grimacing motioning to his UES saying "it got stuck". The pt consumed both puree and regular solids with similar results, with some mild delayed throat clearing (approx.  1.5 minutes) with puree. The pt also supports that cold foods and especially drinks make his swallowing worse. Given the pt's GI hx (barret esophagus) and cognitive status, safest diet recs at this time are puree and think liquids with STRICT aspiration and GERD precautions (sit upright for all PO intake and 30 minutes following, small bites and sips, reduced rate of intake, reduced distractions, no cold food or drink) with assist for feeding and FREQUENT oral care before and after all meals. The pt would benefit from f/u  with GI to address signs of concerns, SLP to f/u closely to ensure carryover of developed strategies and success with diet recs. SLP Visit Diagnosis: Dysphagia, unspecified (R13.10)    Aspiration Risk  Mild aspiration risk    Diet Recommendation Dysphagia 1 (Puree);Thin liquid    Liquid Administration via: Straw Medication Administration: Crushed with puree Supervision: Full supervision/cueing for compensatory strategies Compensations: Minimize environmental distractions;Slow rate;Small sips/bites (No cold food or drink) Postural Changes: Seated upright at 90 degrees;Remain upright for at least 30 minutes after po intake    Other  Recommendations Recommended Consults: Consider GI evaluation;Consider esophageal assessment Oral Care Recommendations: Oral care QID;Staff/trained caregiver to provide oral care;Oral care before and after PO     Assistance Recommended at Discharge    Functional Status Assessment Patient has had a recent decline in their functional status and demonstrates the ability to make significant improvements in function in a reasonable and predictable amount of time.  Frequency and Duration min 3x week  2 weeks       Prognosis Prognosis for improved oropharyngeal function: Fair Barriers to Reach Goals: Cognitive deficits      Swallow Study   General Date of Onset: 02/24/24 HPI: [name] is a 63 y.o. male w/PMHx of Parkinson's Afib on Eliquis , hypothyroidism, HTN, HLD, barret esophagus, OA, Seizures, schizophrenia, and frontotemporal dementia. presenting with after fall with head strike. The patient was walking into his doctors office when he tripped over his caregivers feet and fell, hitting his head on the ground. No LOC. Patient denies pain, HA, vision changes. In the ED, patient was alert and oriented to self and place. CT head showed small acute subarachnoid hemorrhage along medial right frontal lobe and acute subdural hemorrhage along the falx. The pt did  not pass the yale, with no coughing but stopping partially through the 3 oz. Type of Study: Bedside Swallow Evaluation Previous Swallow Assessment: N/a Diet Prior to this Study: NPO Temperature Spikes Noted: No Respiratory Status: Room air History of Recent Intubation: No Behavior/Cognition: Pleasant mood;Confused;Distractible;Requires cueing;Doesn't follow directions Oral Cavity Assessment: Dried secretions Oral Care Completed by SLP: Yes Oral Cavity - Dentition: Adequate natural dentition Vision: Functional for self-feeding Self-Feeding Abilities: Needs assist Patient Positioning: Upright in bed Baseline Vocal Quality: Other (comment) (Pt had some grabled speech, DO supports this is different from 6 months ago when he saw him last) Volitional Cough: Cognitively unable to elicit Volitional Swallow: Unable to elicit    Oral/Motor/Sensory Function Overall Oral Motor/Sensory Function: Mild impairment Facial Symmetry: Within Functional Limits Facial Strength: Reduced right;Reduced left Facial Sensation: Within Functional Limits Lingual ROM: Within Functional Limits Lingual Symmetry: Within Functional Limits Lingual Strength: Reduced Lingual Sensation: Within Functional Limits   Ice Chips Ice chips: Within functional limits   Thin Liquid Thin Liquid: Impaired Presentation: Straw Oral Phase Functional Implications: Oral holding Pharyngeal  Phase Impairments: Multiple swallows;Suspected delayed Swallow    Nectar Thick Nectar Thick Liquid: Impaired Presentation: Straw Oral phase functional implications:  Oral holding Pharyngeal Phase Impairments: Suspected delayed Swallow;Multiple swallows   Honey Thick     Puree Puree: Impaired Presentation: Spoon Oral Phase Functional Implications: Oral holding Pharyngeal Phase Impairments: Multiple swallows;Suspected delayed Swallow   Solid     Solid: Impaired Presentation: Self Fed Pharyngeal Phase Impairments: Multiple swallows      Manuelita Blew M.S. CCC-SLP

## 2024-02-25 NOTE — [place] FL2 (Signed)
 [place]  MEDICAID FL2 LEVEL OF CARE FORM     IDENTIFICATION  Patient Name: [name] Birthdate: 10/16/1960 Sex: male Admission Date (Current Location): 02/24/2024  [facility] and [place] Number:  Producer, television/film/video and Address:  [facility] [place], [address], [place], [place] [address]      Provider Number: [id number]  Attending Physician Name and Address:  McDiarmid, Krystal BIRCH, MD  Relative Name and Phone Number:       Current Level of Care: Hospital Recommended Level of Care: Skilled Nursing Facility Prior Approval Number:    Date Approved/Denied:   PASRR Number: Manual review  Discharge Plan: SNF    Current Diagnoses: Patient Active Problem List   Diagnosis Date Noted   Traumatic subarachnoid hemorrhage (HCC) 02/25/2024   Difficulty in urination 02/25/2024   Cerebral atrophy (HCC) 02/25/2024   Traumatic subdural hemorrhage (HCC) 02/24/2024   Altered mental status 12/22/2023   Chronic health problem 12/22/2023   Type 2 diabetes mellitus without complication, without long-term current use of insulin  (HCC) 04/11/2023   Multiple falls 04/04/2023   C5 cervical fracture (HCC) 04/03/2023   Belching 12/14/2022   DISH (diffuse idiopathic skeletal hyperostosis) 05/12/2022   Bilateral primary osteoarthritis of hip 08/13/2021   Statin intolerance 06/24/2021   Spinal stenosis of lumbar region 01/16/2020   Chronic lumbar radiculopathy 12/08/2019   Hyperlipidemia associated with type 2 diabetes mellitus (HCC) 12/08/2019   Paronychia of left index finger 05/19/2019   Fall 11/24/2018   Rosacea 10/24/2018   Loss of weight 09/09/2018   Schizoaffective disorder, depressive type (HCC) 04/08/2018   Varicose veins of bilateral lower extremities with other complications 02/03/2016   Erectile dysfunction 04/26/2015   Seizures (HCC)    Persistent atrial fibrillation (HCC)    Acute bilateral low back pain without sciatica    Seborrheic dermatitis 02/10/2007     Orientation RESPIRATION BLADDER Height & Weight     Self, Place  Normal Indwelling catheter Weight: 192 lb 7.4 oz (87.3 kg) Height:  5' 9 (175.3 cm)  BEHAVIORAL SYMPTOMS/MOOD NEUROLOGICAL BOWEL NUTRITION STATUS      Incontinent Diet (see DC summary)  AMBULATORY STATUS COMMUNICATION OF NEEDS Skin   Extensive Assist Verbally Normal                       Personal Care Assistance Level of Assistance  Bathing, Feeding, Dressing Bathing Assistance: Maximum assistance Feeding assistance: Limited assistance Dressing Assistance: Maximum assistance     Functional Limitations Info  Speech     Speech Info: Impaired (dysarthria)    SPECIAL CARE FACTORS FREQUENCY  PT (By licensed PT), OT (By licensed OT), Speech therapy     PT Frequency: 5x/wk OT Frequency: 5x/wk     Speech Therapy Frequency: 5x/wk      Contractures Contractures Info: Not present    Additional Factors Info  Code Status, Allergies Code Status Info: Full Allergies Info: Glucophage  (Metformin ), Lamictal  (Lamotrigine )           Current Medications (02/25/2024):  This is the current hospital active medication list Current Facility-Administered Medications  Medication Dose Route Frequency Provider Last Rate Last Admin   acetaminophen  (TYLENOL ) tablet 650 mg  650 mg Oral Q6H PRN Alena Morrison, Reagan, MD       Or   acetaminophen  (TYLENOL ) suppository 650 mg  650 mg Rectal Q6H PRN Alena Morrison, Reagan, MD       allopurinol  (ZYLOPRIM ) tablet 300 mg  300 mg Oral  Daily Everhart, Kirstie, DO   300 mg at 02/25/24 1122   atorvastatin  (LIPITOR) tablet 40 mg  40 mg Oral QHS Everhart, Kirstie, DO       benztropine  (COGENTIN ) tablet 0.5 mg  0.5 mg Oral BID Everhart, Kirstie, DO   0.5 mg at 02/25/24 1122   Chlorhexidine  Gluconate Cloth 2 % PADS 6 each  6 each Topical Daily Everhart, Kirstie, DO   6 each at 02/25/24 1145   divalproex  (DEPAKOTE  SPRINKLE) capsule 250 mg  250 mg Oral Q8H Bronson, Martin, DO    250 mg at 02/25/24 1501   doxazosin  (CARDURA ) tablet 1 mg  1 mg Oral QHS Bronson, Martin, DO       levothyroxine  (SYNTHROID ) tablet 125 mcg  125 mcg Oral Q0600 Everhart, Kirstie, DO       Oral care mouth rinse  15 mL Mouth Rinse 4 times per day McDiarmid, Krystal BIRCH, MD   15 mL at 02/25/24 1502   Oral care mouth rinse  15 mL Mouth Rinse PRN McDiarmid, Krystal BIRCH, MD       perphenazine  (TRILAFON ) tablet 24 mg  24 mg Oral QHS McDiarmid, Krystal BIRCH, MD       phenelzine  (NARDIL ) tablet 45 mg  45 mg Oral BID Everhart, Kirstie, DO   45 mg at 02/25/24 1122     Discharge Medications: Please see discharge summary for a list of discharge medications.  Relevant Imaging Results:  Relevant Lab Results:   Additional Information SS#: [id number]  Almarie CHRISTELLA Goodie, LCSW

## 2024-02-25 NOTE — TOC Initial Note (Signed)
 Transition of Care [facility]) - Initial/Assessment Note    Patient Details  Name: [name] MRN: [id number] Date of Birth: [birth year]  Transition of Care [facility]) CM/SW Contact:    Almarie CHRISTELLA Goodie, LCSW Phone Number: 02/25/2024, 3:58 PM  Clinical Narrative:     CSW spoke with Rowena at [facility] to discuss patient's baseline and ability to return. Per Rowena, patient has been having multiple falls, they have put everything that they can in place to help him, but his functioning has been getting worse; they would recommend rehab for patient, and they were going to work on that before he came to the hospital. CSW completed referral and faxed out, will provide update on offers for SNF to patient's mother. CSW to follow.              Expected Discharge Plan: Skilled Nursing Facility Barriers to Discharge: Continued Medical Work up, English as a second language teacher, Engineer, mining)   Patient Goals and CMS Choice Patient states their goals for this hospitalization and ongoing recovery are:: patient unable to participate in goal setting, not fully oriented          Expected Discharge Plan and Services     Post Acute Care Choice: Skilled Nursing Facility Living arrangements for the past 2 months: Assisted Living Facility                                      Prior Living Arrangements/Services Living arrangements for the past 2 months: Assisted Living Facility Lives with:: Facility Resident Patient language and need for interpreter reviewed:: No Do you feel safe going back to the place where you live?: Yes      Need for Family Participation in Patient Care: Yes (Comment) Care giver support system in place?: No (comment)   Criminal Activity/Legal Involvement Pertinent to Current Situation/Hospitalization: No - Comment as needed  Activities of Daily Living   ADL Screening (condition at time of admission) Independently performs ADLs?: No Does the patient  have a NEW difficulty with bathing/dressing/toileting/self-feeding that is expected to last >3 days?: Yes (Initiates electronic notice to provider for possible OT consult) Does the patient have a NEW difficulty with getting in/out of bed, walking, or climbing stairs that is expected to last >3 days?: Yes (Initiates electronic notice to provider for possible PT consult) Does the patient have a NEW difficulty with communication that is expected to last >3 days?: Yes (Initiates electronic notice to provider for possible SLP consult) Is the patient deaf or have difficulty hearing?: No Does the patient have difficulty seeing, even when wearing glasses/contacts?: No Does the patient have difficulty concentrating, remembering, or making decisions?: Yes  Permission Sought/Granted Permission sought to share information with : Facility Medical sales representative, Family Supports Permission granted to share information with : Yes, Verbal Permission Granted  Share Information with NAME: Lonell  Permission granted to share info w AGENCY: SNF  Permission granted to share info w Relationship: Mother     Emotional Assessment   Attitude/Demeanor/Rapport: Unable to Assess Affect (typically observed): Unable to Assess Orientation: : Oriented to Self, Oriented to Place Alcohol / Substance Use: Not Applicable Psych Involvement: No (comment)  Admission diagnosis:  Subarachnoid hemorrhage (HCC) [I60.9] Subdural hemorrhage (HCC) [I62.00] Fall, initial encounter [W19.XXXA] Patient Active Problem List   Diagnosis Date Noted   Traumatic subarachnoid hemorrhage (HCC) 02/25/2024   Difficulty in urination 02/25/2024   Cerebral atrophy (HCC)  02/25/2024   Traumatic subdural hemorrhage (HCC) 02/24/2024   Altered mental status 12/22/2023   Chronic health problem 12/22/2023   Type 2 diabetes mellitus without complication, without long-term current use of insulin  (HCC) 04/11/2023   Multiple falls 04/04/2023   C5 cervical  fracture (HCC) 04/03/2023   Belching 12/14/2022   DISH (diffuse idiopathic skeletal hyperostosis) 05/12/2022   Bilateral primary osteoarthritis of hip 08/13/2021   Statin intolerance 06/24/2021   Spinal stenosis of lumbar region 01/16/2020   Chronic lumbar radiculopathy 12/08/2019   Hyperlipidemia associated with type 2 diabetes mellitus (HCC) 12/08/2019   Paronychia of left index finger 05/19/2019   Fall 11/24/2018   Rosacea 10/24/2018   Loss of weight 09/09/2018   Schizoaffective disorder, depressive type (HCC) 04/08/2018   Varicose veins of bilateral lower extremities with other complications 02/03/2016   Erectile dysfunction 04/26/2015   Seizures (HCC)    Persistent atrial fibrillation (HCC)    Acute bilateral low back pain without sciatica    Seborrheic dermatitis 02/10/2007   PCP:  Howell Lunger, DO Pharmacy:   [facility] - [place], [place] - [address] [place] [address] Phone: [phone] Fax: [phone]  [facility] [phone] - [place], [place] - [address] AT CORNER OF [facility] [address] [place] [address] Phone: [phone] Fax: [phone]  [facility] - [place], [place] - [address] [place] [address] Phone: [phone] Fax: [phone]  [facility] [address] [place] [address] Phone: [phone] Fax: [phone]     Social Drivers of Health (SDOH) Social History: SDOH Screenings   Food Insecurity: Patient Declined (02/24/2024)  Housing: Low Risk  (02/24/2024)  Transportation Needs: No Transportation Needs (02/24/2024)  Utilities: Not At Risk (02/24/2024)  Alcohol Screen: Low Risk  (02/13/2023)  Depression (PHQ2-9): Low Risk  (01/27/2024)  Recent Concern: Depression (PHQ2-9) - Medium Risk (01/03/2024)  Financial Resource Strain: Low Risk  (02/13/2023)  Physical Activity: Inactive  (02/13/2023)  Social Connections: Socially Isolated (02/24/2024)  Stress: No Stress Concern Present (02/13/2023)  Tobacco Use: Low Risk  (02/25/2024)  Health Literacy: Adequate Health Literacy (02/13/2023)   SDOH Interventions:     Readmission Risk Interventions     No data to display

## 2024-02-25 NOTE — Plan of Care (Signed)

## 2024-02-25 NOTE — Progress Notes (Signed)
 16 Fr. Coude catheter inserted, 500 ml urine output noted, Pt tolerated well

## 2024-02-25 NOTE — Progress Notes (Signed)
 Pt is off the unit for CT Head

## 2024-02-25 NOTE — Progress Notes (Addendum)
 Daily Progress Note Intern Pager: [phone]  Patient name: [name] Medical record number: [id number] Date of birth: Nov 07, 1960 Age: 63 y.o. Gender: male  Primary Care Provider: Howell Lunger, DO Consultants: Neurology Code Status: Full, need to confirm with mother  Pt Overview and Major Events to Date:  Admitted 8/21  Assessment and Plan: [name] is a 63 y.o. male with PMH/PSH of frontotemporal dementia, a-fib on eliquis , seizures, HLD, hypothyroidism, hx of 5 falls in past 2 months presenting with fall from standing due to tripping over caregiver's foot. CT showed acute SAH and SDH. Neuro feels patient does not need surgical intervention. Admitted for observation and awaiting repeat CT head.  Assessment & Plan Subdural hemorrhage (HCC) CT head showed small acute subarachnoid hemorrhage along medial right frontal lobe and acute subdural hemorrhage along the falx. Neurosurgery does not recommend surgical intervention. Stat CT today obtained due to new dysarthria, no acute changes from initial CT. Will obtain MRI due to acute dysarthria.  - Neurochecks every 4 hours - SLP: dysphagia 1, can take crushed PO meds - MRI - Reconsult neurosurgery for recommendations - BMP now and in AM - Stop Eliquis  Traumatic subarachnoid hemorrhage (HCC) -Plan as above Persistent atrial fibrillation (HCC) On Eliquis , was given Kcentra  in ED. Not able to restart Eliquis  because of hemorrhage and fall risk with history of falls -Plan as above Multiple falls -PT/OT -Fall precautions Difficulty in urination New onset. Currently on purewick. Difficulty with urinary cath. Initial bladder scan 408cc. Repeat bladder scan 383cc this AM. -Coude cath -Urology consult if unable to coude cath Chronic health problem Hypothyroidism-continue Synthroid  Schizophrenia-continue benztropine , perphenazine , phenelzine  History of seizures-continue Depakote  (currently IV until able to  PO) Hyperlipidemia-continue atorvastatin  DM- Keep patient off of ozempic  outpatient  FEN/GI: NPO, failed bedside swallow, SLP pending PPx: SCD Dispo:Assisted living home or possibly SNF pending clinical improvement .  Subjective:  Oriented only to self but is able to be reminded of history. Waxing and waning in memory and somnolent. Patient does not remember how he fell yesterday. He is worried that the fall would worsen his depression. He mentioned posterior neck pain yesterday but has resolved today. He admits to having suprapubic pain on palpation and states that he had difficulty urinating. He notes not having a bowel movement yet. Patient denies current neck pain, headache, dizziness, blurry vision, fatigue, weakness, CP, SOB, abdominal pain, extremity pain, and any other associated symptoms or complaints.  Objective: Temp:  [97.5 F (36.4 C)-98.9 F (37.2 C)] 97.7 F (36.5 C) (08/22 0316) Pulse Rate:  [67-83] 77 (08/22 0316) Resp:  [14-19] 18 (08/22 0316) BP: (106-126)/(68-93) 122/84 (08/22 0316) SpO2:  [90 %-97 %] 96 % (08/22 0316) FiO2 (%):  [21 %] 21 % (08/21 1851) Weight:  [87.3 kg-88 kg] 87.3 kg (08/21 1852) Physical Exam: General: Laying in bed, NAD, somnolent Cardiovascular: RRR Respiratory: CTAB, breathing normally on room air Abdomen: TTP in suprapubic region, soft, non-distended Extremities: No deformities Neuro: AxOx1 to self, waxing and waning, strength 4/5 in and sensation intact in all extremities. CN II-XII intact. Bilateral upper extremities tremors, increased with movement. Finger to nose negative. Positive right pronator drift.  Laboratory: Most recent CBC Lab Results  Component Value Date   WBC 6.1 02/25/2024   HGB 10.7 (L) 02/25/2024   HCT 32.0 (L) 02/25/2024   MCV 87.4 02/25/2024   PLT 133 (L) 02/25/2024   Most recent BMP    Latest Ref Rng & Units 02/24/2024  4:55 PM  BMP  Glucose 70 - 99 mg/dL 93   BUN 8 - 23 mg/dL 6   Creatinine 9.38 - 8.75  mg/dL 9.29   Sodium 864 - 854 mmol/L 132   Potassium 3.5 - 5.1 mmol/L 3.1   Chloride 98 - 111 mmol/L 95    Other pertinent labs Hgb 11.1>11.2>10.7 Hct 33.7>33.0>32.0 RDW 16.9>16.8 RBC 3.79>3.66 Plts 150>133  Hep unfractionated >1.10 (elevated) Prothrombin  time 18.3 (elevated) INR 1.4 (elevated)  Gluc 101>93 ABO/RH(D) A positive  Na+ 133>132 K+ 3.1>3.1  Imaging/Diagnostic Tests: Bladder scan:  Repeat bladder scan:  Repeat CT Head Pending  CT Head WO Contrast 02/24/24 IMPRESSION: 1. Small-volume acute subarachnoid hemorrhage along the medial right frontal lobe. 2. Acute subdural hemorrhage along the falx (measuring up to 4 mm in thickness). 3. Advanced cerebral atrophy. 4. Mild cerebral white matter disease, nonspecific but most often secondary to chronic small vessel ischemia.   CT C-spine 02/24/24 IMPRESSION: 1. No CT evidence for acute osseous abnormality of the cervical spine. 2. Anterior fusion hardware C4 through C7. 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 Chronic degenerative changes and ossification of posterior longitudinal ligament as above.   XR Pelvis 02/24/24 IMPRESSION: 1. No acute fracture or dislocation. 2. Moderate bilateral hip arthritic change.   CXR 02/24/24 IMPRESSION: No active disease.   R Femur 02/24/24 IMPRESSION: 1. No acute fracture or dislocation. 2. Mild arthritic changes of the right hip. Wilburt Gwenn Bernida MARLA, Medical Student 02/25/2024, 7:17 AM  [facility] FPTS Intern pager: [phone], text pages welcome Secure chat group [facility] Teaching Service   I was personally present and performed or re-performed the history, physical exam and medical decision making activities of this service and have verified that the service and findings are accurately documented in the student's note.  Shamia Uppal, DO                   02/25/2024, 11:42 AM

## 2024-02-25 NOTE — TOC CAGE-AID Note (Signed)
 Transition of Care [facility]) - CAGE-AID Screening   Patient Details  Name: [name] MRN: [id number] Date of Birth: 1960-12-06  Transition of [facility]) CM/SW Contact:    Jiyah Torpey E Amelia Burgard, LCSW Phone Number: 02/25/2024, 9:10 AM   Clinical Narrative: No SA noted.   CAGE-AID Screening:    Have You Ever Felt You Ought to Cut Down on Your Drinking or Drug Use?: No Have People Annoyed You By Critizing Your Drinking Or Drug Use?: No Have You Felt Bad Or Guilty About Your Drinking Or Drug Use?: No Have You Ever Had a Drink or Used Drugs First Thing In The Morning to Steady Your Nerves or to Get Rid of a Hangover?: No CAGE-AID Score: 0  Substance Abuse Education Offered: No

## 2024-02-25 NOTE — Evaluation (Signed)
 Occupational Therapy Evaluation Patient Details Name: [name] MRN: [id number] DOB: July 08, 1960 Today's Date: 02/25/2024   History of Present Illness   63 y.o. male presents to [facility] 02/24/24 after falling with SDH and SAH. PMHx of dementia, Parkinson's Afib on Eliquis , hypothyroidism, HTN, HLD, seizures, OA, schizophrenia     Clinical Impressions Pt admitted for above, PTA pt was at an ALF and had staff give supervision assist for bathing, ambulating with RW. Pt currently presenting with impaired balance and cognition, needing several cues to facilitate mobility and mutli modal cueing to maintain balance sitting EOB, promoting forward trunk flexion. Pt was able to stand and take small steps EOB with min A+2 but anticipate more assist needed for full gait training. Pt also requiring max A to CGA for ADLs. Pt would benefit from further acute skilled OT services to address listed deficits and progress pt as able. Patient will benefit from continued inpatient follow up therapy, <3 hours/day      If plan is discharge home, recommend the following:   A lot of help with walking and/or transfers;A lot of help with bathing/dressing/bathroom;Assistance with cooking/housework;Supervision due to cognitive status     Functional Status Assessment   Patient has had a recent decline in their functional status and demonstrates the ability to make significant improvements in function in a reasonable and predictable amount of time.     Equipment Recommendations   None recommended by OT (defer to next level of care)     Recommendations for Other Services         Precautions/Restrictions   Precautions Precautions: Fall Recall of Precautions/Restrictions: Impaired Restrictions Weight Bearing Restrictions Per Provider Order: No     Mobility Bed Mobility Overal bed mobility: Needs Assistance Bed Mobility: Rolling, Sidelying to Sit, Sit to Supine Rolling: Mod assist, Used  rails Sidelying to sit: Mod assist, HOB elevated, Used rails   Sit to supine: Mod assist, +2 for physical assistance, +2 for safety/equipment, HOB elevated, Used rails   General bed mobility comments: able to roll with ModA and cues to reach for bed rail. Assist to bring LE's off EOB and slight assist to raise trunk. Step by step cueing with difficulty sequencing    Transfers Overall transfer level: Needs assistance Equipment used: 2 person hand held assist Transfers: Sit to/from Stand Sit to Stand: Min assist, +2 physical assistance, +2 safety/equipment           General transfer comment: With RW placed in front of pt, pt became retropulsive with difficulty keeping feet flat. Stood with [facility] and MinAx2      Balance Overall balance assessment: Needs assistance, Mild deficits observed, not formally tested, History of Falls   Sitting balance-Leahy Scale: Poor Sitting balance - Comments: CGA to MaxA with pt occasionally leaning posteriorly with LE's sliding anteriorly Postural control: Posterior lean Standing balance support: Bilateral upper extremity supported, During functional activity, Reliant on assistive device for balance Standing balance-Leahy Scale: Poor Standing balance comment: reliant on UE and external support                           ADL either performed or assessed with clinical judgement   ADL Overall ADL's : Needs assistance/impaired Eating/Feeding: Set up;Bed level   Grooming: Sitting;Wash/dry face;Contact guard assist Grooming Details (indicate cue type and reason): cues for thoroughness Upper Body Bathing: Sitting;Contact guard assist   Lower Body Bathing: Sitting/lateral leans;Moderate assistance   Upper Body Dressing : Sitting;Minimal  assistance   Lower Body Dressing: Moderate assistance;Sit to/from stand;+2 for safety/equipment   Toilet Transfer: +2 for physical assistance;+2 for safety/equipment;Minimal assistance;Stand-pivot;Rolling  walker (2 wheels);BSC/3in1 Toilet Transfer Details (indicate cue type and reason): based on todays mobility Toileting- Clothing Manipulation and Hygiene: Sit to/from stand;Maximal assistance       Functional mobility during ADLs: Minimal assistance;Rolling walker (2 wheels);+2 for physical assistance;+2 for safety/equipment (lateral steps at bedside)       Vision   Vision Assessment?: Yes Eye Alignment: Within Functional Limits Ocular Range of Motion: Within Functional Limits Alignment/Gaze Preference: Within Defined Limits Tracking/Visual Pursuits: Requires cues, head turns, or add eye shifts to track Saccades: Additional eye shifts occurred during testing;Additional head turns occurred during testing;Decreased speed of saccadic movement;Other (comment) (gaze always goes back to midline before transition to next object) Convergence: Within functional limits Visual Fields: Other (comment) (NT) Diplopia Assessment: Other (comment) (denies)     Perception Perception:  (suspect some inattention on the L, will furhter assess)       Praxis Praxis: Impaired Praxis Impairment Details: Motor planning, Organization     Pertinent Vitals/Pain Pain Assessment Pain Assessment: Faces Faces Pain Scale: Hurts even more Pain Location: neck Pain Descriptors / Indicators: Aching, Discomfort Pain Intervention(s): Limited activity within patient's tolerance, Monitored during session, Repositioned     Extremity/Trunk Assessment Upper Extremity Assessment Upper Extremity Assessment: Generalized weakness (BUE elbow ext 3+/5, 4/5 elbow flex, shoulder AROM WFL)   Lower Extremity Assessment Lower Extremity Assessment: Generalized weakness       Communication Communication Communication: No apparent difficulties   Cognition Arousal: Alert Behavior During Therapy: WFL for tasks assessed/performed Cognition: Cognition impaired     Awareness: Online awareness impaired, Intellectual awareness  impaired Memory impairment (select all impairments): Working memory Attention impairment (select first level of impairment): Sustained attention Executive functioning impairment (select all impairments): Problem solving, Organization                   Following commands: Impaired Following commands impaired: Follows one step commands inconsistently, Follows one step commands with increased time     Cueing  General Comments   Cueing Techniques: Verbal cues;Tactile cues;Visual cues;Gestural cues      Exercises     Shoulder Instructions      Home Living Family/patient expects to be discharged to:: Assisted living Living Arrangements: Alone                               Additional Comments: Lives at [facility]  Lives With: Alone    Prior Functioning/Environment               Mobility Comments: assist for bed mobility and to ambulate with RW. Occasionally uses WC ADLs Comments: staff close by for bathing, uses shower seat. Reports using bed pan    OT Problem List: Impaired balance (sitting and/or standing);Pain;Decreased strength;Decreased cognition   OT Treatment/Interventions: Self-care/ADL training;Therapeutic exercise;Patient/family education;Balance training;Therapeutic activities;DME and/or AE instruction      OT Goals(Current goals Trujillo be found in the care plan section)   Acute Rehab OT Goals Patient Stated Goal: get better OT Goal Formulation: With patient Time For Goal Achievement: 03/10/24 Potential to Achieve Goals: Good ADL Goals Pt Will Perform Grooming: standing;with contact guard assist Pt Will Perform Lower Body Bathing: with set-up;sitting/lateral leans Pt Will Perform Lower Body Dressing: sit to/from stand;with supervision Pt Will Transfer to Toilet: with supervision;bedside commode;stand pivot transfer  OT Frequency:  Min 2X/week    Co-evaluation   Reason for Co-Treatment: Necessary to address cognition/behavior  during functional activity;For patient/therapist safety;To address functional/ADL transfers PT goals addressed during session: Balance;Mobility/safety with mobility;Proper use of DME OT goals addressed during session: ADL's and self-care;Other (comment) (vision)      AM-PAC OT 6 Clicks Daily Activity     Outcome Measure Help from another person eating meals?: A Little Help from another person taking care of personal grooming?: A Little Help from another person toileting, which includes using toliet, bedpan, or urinal?: A Lot Help from another person bathing (including washing, rinsing, drying)?: A Lot Help from another person to put on and taking off regular upper body clothing?: A Little Help from another person to put on and taking off regular lower body clothing?: A Lot 6 Click Score: 15   End of Session Equipment Utilized During Treatment: Gait belt;Rolling walker (2 wheels) Nurse Communication: Mobility status  Activity Tolerance: Patient tolerated treatment well Patient left: in bed;with call bell/phone within reach;with bed alarm set  OT Visit Diagnosis: Unsteadiness on feet (R26.81);Other abnormalities of gait and mobility (R26.89);History of falling (Z91.81);Pain;Other symptoms and signs involving cognitive function Pain - part of body:  (neck)                Time: 8955-8896 OT Time Calculation (min): 19 min Charges:  OT General Charges $OT Visit: 1 Visit OT Evaluation $OT Eval Moderate Complexity: 1 Mod  02/25/2024  AB, OTR/L  Acute Rehabilitation Services  Office: [phone]   [name] 02/25/2024, 6:16 PM

## 2024-02-25 NOTE — Assessment & Plan Note (Addendum)
>>  ASSESSMENT AND PLAN FOR TRAUMATIC SUBDURAL HEMORRHAGE (HCC) WRITTEN ON 02/25/2024 11:44 AM BY Noriko Macari, DO  CT head showed small acute subarachnoid hemorrhage along medial right frontal lobe and acute subdural hemorrhage along the falx. Neurosurgery does not recommend surgical intervention. Stat CT today obtained due to new dysarthria, no acute changes from initial CT. Will obtain MRI due to acute dysarthria.  - Neurochecks every 4 hours - SLP: dysphagia 1, can take crushed PO meds - MRI - Reconsult neurosurgery for recommendations - BMP now and in AM - Stop Eliquis    >>ASSESSMENT AND PLAN FOR TRAUMATIC SUBARACHNOID HEMORRHAGE (HCC) WRITTEN ON 02/25/2024 11:44 AM BY Jasmen Emrich, DO  -Plan as above

## 2024-02-25 NOTE — Progress Notes (Signed)
 Pt unable to urinate even after getting up with a stedy to the bathroom. Bladder scan showing 408 cc bladder volume. MD informed and has ordered to in and out cath the pt. Attempted to in and out but unsuccessful because the cath was met with resistance upon insertion and the pt was uncomfortable, prompting RN to pull the cath out. MD informed and aware.

## 2024-02-25 NOTE — Assessment & Plan Note (Addendum)
-   PT/OT - Fall precautions

## 2024-02-25 NOTE — Evaluation (Signed)
 Speech Language Pathology Evaluation Patient Details Name: [name] MRN: [id number] DOB: September 11, 1960 Today's Date: 02/25/2024 Time: 9064-9045 SLP Time Calculation (min) (ACUTE ONLY): 19 min  Problem List:  Patient Active Problem List   Diagnosis Date Noted   Traumatic subarachnoid hemorrhage (HCC) 02/25/2024   Difficulty in urination 02/25/2024   Cerebral atrophy (HCC) 02/25/2024   Traumatic subdural hemorrhage (HCC) 02/24/2024   Altered mental status 12/22/2023   Chronic health problem 12/22/2023   Type 2 diabetes mellitus without complication, without long-term current use of insulin  (HCC) 04/11/2023   Multiple falls 04/04/2023   C5 cervical fracture (HCC) 04/03/2023   Belching 12/14/2022   DISH (diffuse idiopathic skeletal hyperostosis) 05/12/2022   Bilateral primary osteoarthritis of hip 08/13/2021   Statin intolerance 06/24/2021   Spinal stenosis of lumbar region 01/16/2020   Chronic lumbar radiculopathy 12/08/2019   Hyperlipidemia associated with type 2 diabetes mellitus (HCC) 12/08/2019   Paronychia of left index finger 05/19/2019   Fall 11/24/2018   Rosacea 10/24/2018   Loss of weight 09/09/2018   Schizoaffective disorder, depressive type (HCC) 04/08/2018   Varicose veins of bilateral lower extremities with other complications 02/03/2016   Erectile dysfunction 04/26/2015   Seizures (HCC)    Persistent atrial fibrillation (HCC)    Acute bilateral low back pain without sciatica    Seborrheic dermatitis 02/10/2007   Past Medical History:  Past Medical History:  Diagnosis Date   A-fib (HCC)    Acute right-sided low back pain    Anxiety    Arrhythmia    Barrett esophagus    Chronic a-fib (HCC)    Closed fracture of left distal radius    Contact dermatitis 04/16/2017   Depression    Depression    Disorientation 04/06/2023   Hypothyroidism    Low back pain    Morbid obesity, BMI unknown (HCC)    Obesity    Osteoarthritis    oa in bilateral knees    Schizophrenia (HCC)    Seizures (HCC)    Seizures (HCC)    Thyroid  disease    Varicose veins    Vertigo 11/03/2019   Past Surgical History:  Past Surgical History:  Procedure Laterality Date   ACHILLES TENDON REPAIR  approx. 2004   right foot   ANTERIOR CERVICAL DECOMP/DISCECTOMY FUSION N/A 04/08/2023   Procedure: Anterior Cervical Discectomy Fusion Cervical Five-Cervical Six;  Surgeon: Lanis Pupa, MD;  Location: [facility] OR;  Service: Neurosurgery;  Laterality: N/A;   CATARACT EXTRACTION     bilateral   OPEN REDUCTION INTERNAL FIXATION (ORIF) DISTAL RADIAL FRACTURE Left 07/21/2018   Procedure: OPEN REDUCTION INTERNAL FIXATION (ORIF) DISTAL RADIAL FRACTURE;  Surgeon: Murrell Drivers, MD;  Location: [facility];  Service: Orthopedics;  Laterality: Left;   stab phlebectomy  Right 11/09/2016   stab phlebectomy R leg by Lynwood Collum MD   TOOTH EXTRACTION     HPI:  [name] is a 63 y.o. male w/PMHx of Parkinson's Afib on Eliquis , hypothyroidism, HTN, HLD, barret esophagus, OA, Seizures, schizophrenia, and frontotemporal dementia. presenting with after fall with head strike. The patient was walking into his doctors office when he tripped over his caregivers feet and fell, hitting his head on the ground. No LOC. Patient denies pain, HA, vision changes. In the ED, patient was alert and oriented to self and place. CT head showed small acute subarachnoid hemorrhage along medial right frontal lobe and acute subdural hemorrhage along the falx. The pt did not pass the yale, with no coughing  but stopping partially through the 3 oz.   Assessment / Plan / Recommendation Clinical Impression  Pt seen for cognitive evaluation. The pt presents with a significant cognitive impairment which per DO reports is a steep decline from 6 mo. ago. The pt was given the [facility] Mental Status Exam (SLUMS), where he scored a 2/30, which is well below normal limits and indicative of dementia.  The pt's areas of strengths were visual identification and orientation to self and state. The pt's noted areas of weaknesses were orientation, immediate recall, story recall, numeric calculation, executive function (organization), object discrimination, and attention skills. The pt was not oriented to the month year or current location, when given a choice of two the pt had increased success. The pt was given cueing t/o the evaluation to determine efficacy of redirection and recovery, the pt had inconsistent success with the moderate verbal cueing. The pt's clock was disorganized and scarce, however the pt did recall "I've done this with a neurologist" and that his tremor affects his ability to write. The pt also had perseveration on previously asked questions and little awareness of the questions being administered with cues. The pt's speech was also slightly slurred, DO supports this is different from baseline. The pt's conversational speech was approx. 85-90% intelligible with increased loudness, and slowed rate effective to increase intelligibility. The SLP left the pt with a visual cue to increase orientation to place and decrease general confusion. The pt would benefit from continued skilled ST services to address the areas of concern listed above in addition to his swallow goals.    SLP Assessment  SLP Visit Diagnosis: Cognitive communication deficit (R41.841)     Assistance Recommended at Discharge     Functional Status Assessment Patient has had a recent decline in their functional status and demonstrates the ability to make significant improvements in function in a reasonable and predictable amount of time.  Frequency and Duration min 3x week         SLP Evaluation Cognition  Overall Cognitive Status: Impaired/Different from baseline Arousal/Alertness: Awake/alert Orientation Level: Oriented to person;Disoriented to place;Disoriented to time Year: 2026 Month:  (2026, when given a  choice of two picked August.) Day of Week: Incorrect Attention: Focused;Sustained Focused Attention: Impaired Focused Attention Impairment: Functional basic;Verbal complex;Verbal basic Sustained Attention: Impaired Sustained Attention Impairment: Verbal basic;Verbal complex;Functional basic Memory: Impaired Memory Impairment: Storage deficit;Retrieval deficit;Decreased recall of new information;Decreased long term memory;Decreased short term memory Decreased Long Term Memory: Functional basic Decreased Short Term Memory: Verbal basic;Verbal complex;Functional basic;Functional complex Awareness: Impaired Awareness Impairment: Intellectual impairment Problem Solving: Impaired Problem Solving Impairment: Functional basic Executive Function: Organizing;Initiating;Self Monitoring Organizing: Impaired Organizing Impairment: Functional basic Initiating: Impaired Initiating Impairment: Functional complex;Verbal complex Self Monitoring: Impaired Self Monitoring Impairment: Functional basic;Functional complex Safety/Judgment: Impaired       Comprehension  Auditory Comprehension Overall Auditory Comprehension: Appears within functional limits for tasks assessed Yes/No Questions: Impaired Basic Immediate Environment Questions: 50-74% accurate Complex Questions: 50-74% accurate Commands: Impaired One Step Basic Commands: 50-74% accurate Two Step Basic Commands: 50-74% accurate Conversation: Simple Interfering Components: Attention;Processing speed;Working Radio broadcast assistant: Extra processing time;Increased volume;Visual/Gestural cues Visual Recognition/Discrimination Discrimination: Exceptions to [facility] Black/White Line Drawings: Able in field of 3 Reading Comprehension Reading Status: Not tested    Expression Expression Primary Mode of Expression: Verbal Verbal Expression Overall Verbal Expression: Appears within functional limits for tasks assessed Initiation: No  impairment Black/White Line Drawings: Able in field of 3   Oral / Motor  Oral  Motor/Sensory Function Overall Oral Motor/Sensory Function: Mild impairment Facial Symmetry: Within Functional Limits Facial Strength: Reduced right;Reduced left Facial Sensation: Within Functional Limits Lingual ROM: Within Functional Limits Lingual Symmetry: Within Functional Limits Lingual Strength: Reduced Lingual Sensation: Within Functional Limits Motor Speech Overall Motor Speech: Impaired Respiration: Within functional limits Phonation:  (some slurred speech) Resonance: Within functional limits Articulation: Impaired Level of Impairment: Conversation Intelligibility: Intelligibility reduced Word: Not tested Phrase: Not tested Sentence: 75-100% accurate (95%) Conversation: 75-100% accurate (85-90%) Motor Speech Errors: Consistent Effective Techniques: Increased vocal intensity;Pause;Slow rate   Manuelita Blew M.S. CCC-SLP

## 2024-02-25 NOTE — Hospital Course (Addendum)
 [name] is a 63 y.o.male with a history of frontotemporal dementia, a-fib on eliquis , seizures, HLD, hypothyroidism, hx of 5 falls in past 2 months  who was admitted to the [facility] Medicine Teaching Service at [facility] for fall found to have acute SDH and SAH. His hospital course is detailed below:  Subdural hemorrhage and traumatic subarachnoid hermorrhage Patient presented after a fall from standing and hit his head. CT head showed small acute subarachnoid hemorrhage along medial right frontal lobe and acute subdural hemorrhage along the falx.  Neurosurgery did not see indication for intervention.  Due to concern of new dysarthria, MRI brain was obtained showing no progression of bleed or acute changes.  Aspiration pneumonia Early morning of 8/24 patient had PEA cardiac arrest and achieved ROSC after 1 round of CPR.  He was transferred to the ICU and intubated.  EKG and troponins unremarkable.  CT head unchanged, CTA chest negative for PE.  EEG with no sign of seizure.  Cardiac arrest thought to be secondary to aspiration event.  He was started on Unasyn  (8/24-) for aspiration pneumonia. He was extubated 8/24.   Persistent atrial fibrillation (HCC) On Eliquis , was given Kcentra  in ED. Did not restart Eliquis  because of hemorrhage and fall risk with history of falls.  Difficulty in urination New onset. Signs of urinary retention on bladder scan. Coude cath placed on 8/22.   Multiple falls Placed on fall precautions. PT/OT consulted and recommended SNF.  Anemia, Thrombocytopenia Gradually decreasing hemoglobin and platelets since admission, suspect secondary to ***.   Other chronic conditions were medically managed with home medications and formulary alternatives as necessary (hypothyroidism, schizophrenia, seizures, hyperlipidemia)  PCP Follow-up Recommendations:

## 2024-02-25 NOTE — Telephone Encounter (Signed)
 Spoke w/Pt mother regarding phone message. Mother states Pt fell going into his psychiatrist office and was transported by EMS to [facility] for eval. Pt was admitted and it was mentioned he needed to see neurologist and mother is inquiring about that. Discussed that since Pt is admitted he will be seeing one of the neurologists in the hospital. Pt mother voiced understanding as she was thinking it would need to be a [facility] neurologist seeing Pt. She also stated they had mentioned he would be getting an MRI. Voiced understanding to Pt mother and verbalized the hospital neurologist will be reviewing the MRI but our provider will have access to see results if needed. Pt mother voiced understanding and thanks for the call.

## 2024-02-25 NOTE — Assessment & Plan Note (Addendum)
 Plan as above.

## 2024-02-25 NOTE — Assessment & Plan Note (Addendum)
 On Eliquis , was given Kcentra  in ED. Not able to restart Eliquis  because of hemorrhage and fall risk with history of falls -Plan as above

## 2024-02-26 ENCOUNTER — Inpatient Hospital Stay (HOSPITAL_COMMUNITY)

## 2024-02-26 DIAGNOSIS — R55 Syncope and collapse: Secondary | ICD-10-CM | POA: Diagnosis not present

## 2024-02-26 DIAGNOSIS — I609 Nontraumatic subarachnoid hemorrhage, unspecified: Secondary | ICD-10-CM

## 2024-02-26 DIAGNOSIS — I6200 Nontraumatic subdural hemorrhage, unspecified: Secondary | ICD-10-CM | POA: Diagnosis not present

## 2024-02-26 LAB — GLUCOSE, CAPILLARY
Glucose-Capillary: 141 mg/dL — ABNORMAL HIGH (ref 70–99)
Glucose-Capillary: 144 mg/dL — ABNORMAL HIGH (ref 70–99)
Glucose-Capillary: 118 mg/dL — ABNORMAL HIGH (ref 70–99)
Glucose-Capillary: 141 mg/dL — ABNORMAL HIGH (ref 70–99)

## 2024-02-26 LAB — BASIC METABOLIC PANEL WITH GFR
Anion gap: 9 (ref 5–15)
BUN: 6 mg/dL — ABNORMAL LOW (ref 8–23)
CO2: 25 mmol/L (ref 22–32)
Calcium: 8.7 mg/dL — ABNORMAL LOW (ref 8.9–10.3)
Chloride: 96 mmol/L — ABNORMAL LOW (ref 98–111)
Creatinine, Ser: 0.58 mg/dL — ABNORMAL LOW (ref 0.61–1.24)
GFR, Estimated: >60 mL/min (ref >60)
Glucose, Bld: 102 mg/dL — ABNORMAL HIGH (ref 70–99)
Potassium: 3.5 mmol/L (ref 3.5–5.1)
Sodium: 130 mmol/L — ABNORMAL LOW (ref 135–145)

## 2024-02-26 LAB — ECHOCARDIOGRAM LIMITED
Height: 69 in
Weight: 3079.39 [oz_av]

## 2024-02-26 LAB — MAGNESIUM: Magnesium: 1.6 mg/dL — ABNORMAL LOW (ref 1.7–2.4)

## 2024-02-26 MED ORDER — LACTATED RINGERS IV SOLN: INTRAVENOUS | Status: AC

## 2024-02-26 MED ORDER — ALLOPURINOL 300 MG PO TABS
150.0000 mg | ORAL_TABLET | Freq: Every day | ORAL | Status: DC
Start: 2024-02-26 — End: 2024-02-27
  Administered 2024-02-26 – 2024-02-27 (×2): 150 mg via ORAL
  Filled 2024-02-26: qty 1
  Filled 2024-02-26: qty 2

## 2024-02-26 MED ORDER — MAGNESIUM SULFATE 2 GM/50ML IV SOLN
2.0000 g | Freq: Once | INTRAVENOUS | Status: AC
  Administered 2024-02-26: 2 g via INTRAVENOUS
  Filled 2024-02-26: qty 50

## 2024-02-26 MED ORDER — POTASSIUM CHLORIDE CRYS ER 20 MEQ PO TBCR
40.0000 meq | EXTENDED_RELEASE_TABLET | Freq: Once | ORAL | Status: AC
Start: 2024-02-26 — End: 2024-02-26
  Administered 2024-02-26: 40 meq via ORAL
  Filled 2024-02-26: qty 2

## 2024-02-26 NOTE — Progress Notes (Signed)
 Daily Progress Note Intern Pager: [phone]  Patient name: [name] Medical record number: [id number] Date of birth: February 01, 1961 Age: 63 y.o. Gender: male  Primary Care Provider: Howell Lunger, DO Consultants: nsgy consulted by ED Code Status: FULL  Pt Overview and Major Events to Date:  8/21 - admitted  Assessment and Plan:  63yo male PMHx frontotemporal dementia, atrial fibrillation, seizures, HLD, hypothyroidism, schizophrenia who presented with subdural and subarachnoid hemorrhage after fall from standing. MRI obtained yesterday due to dysarthria that seemed different from baseline, which did not show worsening of bleed or any acute findings. Pt will ultimately require SNF placement to get stronger and hopefully prevent future falls.  Assessment & Plan Traumatic subdural hemorrhage  subarachnoid hemorrhage Bleeds stable on subsequent head imaging. No new neurological deficits.  - Neurochecks every 4 hours - PT, OT, SLP Persistent atrial fibrillation (HCC) On Eliquis  prior to admission but will discontinue due to high fall risk and current bleeds.  -Cardiac telemetry Multiple falls Suspect 2/2 deconditioning, progressing dementia  -PT/OT recommending SNF -Fall precautions Difficulty in urination New onset, consider 2/2 BPH. Was unable to take Flomax  initially because he did not pass bedside swallow on admission. 16 Fr coude cath inserted 8/22 with good output - Trial cath removal when appropriate - Consider flomax  trial  Chronic health problem Hypothyroidism-continue Synthroid  Schizophrenia-continue benztropine  0.5mg  BID, perphenazine  24mg  daily, phenelzine  45mg  BID History of seizures-continue Depakote  sprinkle 250mg  q8h Hyperlipidemia-continue atorvastatin  40mg  DM- CBGs well controlled here, stopping outpatient ozempic  Gout-continue allopurinol  150mg  daily   FEN/GI: dysphagia 1 diet PPx: SCDs Dispo:SNF pending clinical improvement   Subjective:   No concerns this morning.   Objective: Temp:  [97.7 F (36.5 C)-98.4 F (36.9 C)] 98.1 F (36.7 C) (08/23 0724) Pulse Rate:  [48-91] 74 (08/23 0724) Resp:  [18-19] 18 (08/23 0724) BP: (115-138)/(73-82) 123/80 (08/23 0724) SpO2:  [94 %-99 %] 99 % (08/23 0724) Physical Exam: General: NAD, sitting up in bed Cardiovascular: irregularly irregular, no murmurs Respiratory: normal work of breathing on RA, CTAB Abdomen: soft, non-tender Extremities: No swelling BLE Neuro: Alert and oriented to person and place. No facial asymmetry. Symmetric sensation and strength BUE and BLE. No focal neurological deficit.   Laboratory: Most recent CBC Lab Results  Component Value Date   WBC 6.1 02/25/2024   HGB 10.7 (L) 02/25/2024   HCT 32.0 (L) 02/25/2024   MCV 87.4 02/25/2024   PLT 133 (L) 02/25/2024   Most recent BMP    Latest Ref Rng & Units 02/25/2024    2:51 PM  BMP  Glucose 70 - 99 mg/dL 79   BUN 8 - 23 mg/dL 6   Creatinine 9.38 - 8.75 mg/dL 9.35   Sodium 864 - 854 mmol/L 131   Potassium 3.5 - 5.1 mmol/L 3.8   Chloride 98 - 111 mmol/L 99   CO2 22 - 32 mmol/L 22   Calcium  8.9 - 10.3 mg/dL 8.8    Imaging/Diagnostic Tests: MRI Brain WO Contrast 02/25/24 IMPRESSION: 1. Small volume of scattered SAH, para falcine and right tentorial SDH not significantly changed from recent head CTs and compatible with sequelae of trauma. 2. No other acute intracranial abnormality identified. Stable advanced cerebral volume loss and chronic signal changes in the brain as described on June MRI.  CT Head WO Contrast 02/25/24 IMPRESSION: No change or worsening since yesterday. Small volume subarachnoid hemorrhage medial to the upper right hemisphere. Small volume subdural hematoma along the falx, maximal thickness 3.4 mm. No  mass effect or hydrocephalus.  Azyriah Nevins, DO 02/26/2024, 7:38 AM  PGY-2, [facility] FPTS Intern pager: [phone], text pages welcome Secure chat  group [facility] Teaching Service

## 2024-02-26 NOTE — Assessment & Plan Note (Signed)
 New onset, consider 2/2 BPH. Was unable to take Flomax  initially because he did not pass bedside swallow on admission. 16 Fr coude cath inserted 8/22 with good output - Trial cath removal when appropriate - Consider flomax  trial

## 2024-02-26 NOTE — Plan of Care (Signed)
  Problem: Clinical Measurements: Goal: Respiratory complications will improve Outcome: Progressing   Problem: Activity: Goal: Risk for activity intolerance will decrease Outcome: Progressing   Problem: Skin Integrity: Goal: Risk for impaired skin integrity will decrease Outcome: Progressing   Problem: Metabolic: Goal: Ability to maintain appropriate glucose levels will improve Outcome: Progressing   Problem: Skin Integrity: Goal: Risk for impaired skin integrity will decrease Outcome: Progressing

## 2024-02-26 NOTE — Assessment & Plan Note (Signed)
 On Eliquis  prior to admission but will discontinue due to high fall risk and current bleeds.  -Cardiac telemetry

## 2024-02-26 NOTE — TOC Progression Note (Addendum)
 Transition of Care [facility]) - Progression Note    Patient Details  Name: [name] MRN: [id number] Date of Birth: 10-27-1960  Transition of Care [facility]) CM/SW Contact  Gwenn Frieze [place], [place] Phone Number: 02/26/2024, 1:22 PM  Clinical Narrative: Voicemail left for pt's mother/HCPOA [place] [phone] requesting return call re current SNF offers. Will provide updates as available.   UPDATE 1340: Return call received from pt's mother. Provided current SNF offers ([facility], [facility]) and she has accepted [facility]. Confirmed bed with [facility] in admissions who will start Hulan barrows today.   Frieze Gwenn, MSW, LCSW [phone] (coverage)        Expected Discharge Plan: Skilled Nursing Facility Barriers to Discharge: Continued Medical Work up, English as a second language teacher, Engineer, mining)               Expected Discharge Plan and Services     Post Acute Care Choice: Skilled Nursing Facility Living arrangements for the past 2 months: Assisted Living Facility                                       Social Drivers of Health (SDOH) Interventions SDOH Screenings   Food Insecurity: Patient Declined (02/24/2024)  Housing: Low Risk  (02/24/2024)  Transportation Needs: No Transportation Needs (02/24/2024)  Utilities: Not At Risk (02/24/2024)  Alcohol  Screen: Low Risk  (02/13/2023)  Depression (PHQ2-9): Low Risk  (01/27/2024)  Recent Concern: Depression (PHQ2-9) - Medium Risk (01/03/2024)  Financial Resource Strain: Low Risk  (02/13/2023)  Physical Activity: Inactive (02/13/2023)  Social Connections: Socially Isolated (02/24/2024)  Stress: No Stress Concern Present (02/13/2023)  Tobacco Use: Low Risk  (02/25/2024)  Health Literacy: Adequate Health Literacy (02/13/2023)    Readmission Risk Interventions     No data to display

## 2024-02-26 NOTE — Assessment & Plan Note (Signed)
 Bleeds stable on subsequent head imaging. No new neurological deficits.  - Neurochecks every 4 hours - PT, OT, SLP

## 2024-02-26 NOTE — Plan of Care (Signed)
  Problem: Clinical Measurements: Goal: Will remain free from infection Outcome: Progressing Goal: Respiratory complications will improve Outcome: Progressing   Problem: Coping: Goal: Level of anxiety will decrease Outcome: Progressing   Problem: Elimination: Goal: Will not experience complications related to urinary retention Outcome: Progressing   Problem: Safety: Goal: Ability to remain free from injury will improve Outcome: Progressing   Problem: Education: Goal: Knowledge of General Education information will improve Description: Including pain rating scale, medication(s)/side effects and non-pharmacologic comfort measures Outcome: Not Progressing   Problem: Health Behavior/Discharge Planning: Goal: Ability to manage health-related needs will improve Outcome: Not Progressing

## 2024-02-26 NOTE — Plan of Care (Signed)
 Spoke with Dana Marasco, patient's mother via phone.  She confirmed his name and date of birth.  She was wondering if patient's neurologist would be seeing him in the hospital, I told her no they have no indication to see him at this time.  Provided her results of patient's recent MRI and that his brain bleed has not progressed. She expresses concern that if he returns to [place] he will continue having frequent falls.  I discussed with her that he is recommended to go to SNF, she agrees with him needing rehab.  I told her that I would reach out to social work and see if they can call her on Monday to help coordinate this.  Per recent Child psychotherapist note, SNF referrals have been faxed out and they have touch base with [facility] already. She was also wondering about patient's potassium.  I let her know that we repleted his potassium and magnesium  today. I advised her that a member of our team will call her on Monday with updates, and we will try to coordinate to where member of the social work team can call her regarding her SNF preferences for patient as she is his healthcare power of attorney.

## 2024-02-26 NOTE — Assessment & Plan Note (Signed)
 Hypothyroidism-continue Synthroid  125mcg Schizophrenia-continue benztropine  0.5mg  BID, perphenazine  24mg  daily, phenelzine  45mg  BID History of seizures-continue Depakote  sprinkle 250mg  q8h Hyperlipidemia-continue atorvastatin  40mg  DM- CBGs well controlled here, stopping outpatient ozempic  Gout-continue allopurinol  150mg  daily

## 2024-02-26 NOTE — Assessment & Plan Note (Signed)
 Suspect 2/2 deconditioning, progressing dementia  -PT/OT recommending SNF -Fall precautions

## 2024-02-27 ENCOUNTER — Inpatient Hospital Stay (HOSPITAL_COMMUNITY)

## 2024-02-27 DIAGNOSIS — J9691 Respiratory failure, unspecified with hypoxia: Secondary | ICD-10-CM | POA: Diagnosis not present

## 2024-02-27 DIAGNOSIS — F209 Schizophrenia, unspecified: Secondary | ICD-10-CM

## 2024-02-27 DIAGNOSIS — I469 Cardiac arrest, cause unspecified: Secondary | ICD-10-CM | POA: Diagnosis not present

## 2024-02-27 DIAGNOSIS — I6200 Nontraumatic subdural hemorrhage, unspecified: Secondary | ICD-10-CM | POA: Diagnosis not present

## 2024-02-27 DIAGNOSIS — Z4682 Encounter for fitting and adjustment of non-vascular catheter: Secondary | ICD-10-CM | POA: Diagnosis not present

## 2024-02-27 DIAGNOSIS — E039 Hypothyroidism, unspecified: Secondary | ICD-10-CM

## 2024-02-27 LAB — LACTIC ACID, PLASMA: Lactic Acid, Venous: 1.5 mmol/L (ref 0.5–1.9)

## 2024-02-27 LAB — BASIC METABOLIC PANEL WITH GFR
Anion gap: 8 (ref 5–15)
Anion gap: 7 (ref 5–15)
BUN: 8 mg/dL (ref 8–23)
BUN: 7 mg/dL — ABNORMAL LOW (ref 8–23)
CO2: 24 mmol/L (ref 22–32)
CO2: 27 mmol/L (ref 22–32)
Calcium: 8.7 mg/dL — ABNORMAL LOW (ref 8.9–10.3)
Calcium: 8.7 mg/dL — ABNORMAL LOW (ref 8.9–10.3)
Chloride: 98 mmol/L (ref 98–111)
Chloride: 97 mmol/L — ABNORMAL LOW (ref 98–111)
Creatinine, Ser: 0.53 mg/dL — ABNORMAL LOW (ref 0.61–1.24)
Creatinine, Ser: 0.64 mg/dL (ref 0.61–1.24)
GFR, Estimated: >60 mL/min (ref >60)
GFR, Estimated: >60 mL/min (ref >60)
Glucose, Bld: 130 mg/dL — ABNORMAL HIGH (ref 70–99)
Glucose, Bld: 108 mg/dL — ABNORMAL HIGH (ref 70–99)
Potassium: 3.7 mmol/L (ref 3.5–5.1)
Potassium: 4.2 mmol/L (ref 3.5–5.1)
Sodium: 130 mmol/L — ABNORMAL LOW (ref 135–145)
Sodium: 131 mmol/L — ABNORMAL LOW (ref 135–145)

## 2024-02-27 LAB — GLUCOSE, CAPILLARY
Glucose-Capillary: 153 mg/dL — ABNORMAL HIGH (ref 70–99)
Glucose-Capillary: 113 mg/dL — ABNORMAL HIGH (ref 70–99)
Glucose-Capillary: 109 mg/dL — ABNORMAL HIGH (ref 70–99)
Glucose-Capillary: 123 mg/dL — ABNORMAL HIGH (ref 70–99)
Glucose-Capillary: 107 mg/dL — ABNORMAL HIGH (ref 70–99)
Glucose-Capillary: 100 mg/dL — ABNORMAL HIGH (ref 70–99)

## 2024-02-27 LAB — POCT I-STAT 7, (LYTES, BLD GAS, ICA,H+H)
Acid-Base Excess: 0 mmol/L (ref 0.0–2.0)
Bicarbonate: 23.5 mmol/L (ref 20.0–28.0)
Calcium, Ion: 1.18 mmol/L (ref 1.15–1.40)
HCT: 30 % — ABNORMAL LOW (ref 39.0–52.0)
Hemoglobin: 10.2 g/dL — ABNORMAL LOW (ref 13.0–17.0)
O2 Saturation: 99 %
Patient temperature: 98.5
Potassium: 3.3 mmol/L — ABNORMAL LOW (ref 3.5–5.1)
Sodium: 130 mmol/L — ABNORMAL LOW (ref 135–145)
TCO2: 25 mmol/L (ref 22–32)
pCO2 arterial: 34.2 mmHg (ref 32–48)
pH, Arterial: 7.445 (ref 7.35–7.45)
pO2, Arterial: 115 mmHg — ABNORMAL HIGH (ref 83–108)

## 2024-02-27 LAB — MRSA NEXT GEN BY PCR, NASAL: MRSA by PCR Next Gen: NOT DETECTED

## 2024-02-27 LAB — MAGNESIUM
Magnesium: 1.7 mg/dL (ref 1.7–2.4)
Magnesium: 1.8 mg/dL (ref 1.7–2.4)

## 2024-02-27 LAB — TROPONIN I (HIGH SENSITIVITY)
Troponin I (High Sensitivity): 15 ng/L (ref <18)
Troponin I (High Sensitivity): 18 ng/L — ABNORMAL HIGH (ref <18)

## 2024-02-27 LAB — PHOSPHORUS: Phosphorus: 3.3 mg/dL (ref 2.5–4.6)

## 2024-02-27 MED ORDER — LEVOTHYROXINE SODIUM 25 MCG PO TABS: 125.0000 ug | ORAL_TABLET | Freq: Every day | ORAL | Status: DC

## 2024-02-27 MED ORDER — SODIUM CHLORIDE 0.9 % IV SOLN
3.0000 g | Freq: Four times a day (QID) | INTRAVENOUS | Status: DC
  Administered 2024-02-27 – 2024-02-28 (×4): 3 g via INTRAVENOUS
  Filled 2024-02-27 (×5): qty 8

## 2024-02-27 MED ORDER — DOCUSATE SODIUM 50 MG/5ML PO LIQD
100.0000 mg | Freq: Two times a day (BID) | ORAL | Status: DC
  Administered 2024-02-27: 100 mg
  Filled 2024-02-27: qty 10

## 2024-02-27 MED ORDER — DEXMEDETOMIDINE HCL IN NACL 400 MCG/100ML IV SOLN
0.0000 ug/kg/h | INTRAVENOUS | Status: DC
  Administered 2024-02-27: 0.4 ug/kg/h via INTRAVENOUS
  Administered 2024-02-28: 0.3 ug/kg/h via INTRAVENOUS
  Filled 2024-02-27 (×2): qty 100

## 2024-02-27 MED ORDER — BENZTROPINE MESYLATE 0.5 MG PO TABS
0.5000 mg | ORAL_TABLET | Freq: Two times a day (BID) | ORAL | Status: DC
  Administered 2024-02-27: 0.5 mg
  Filled 2024-02-27 (×3): qty 1

## 2024-02-27 MED ORDER — PHENELZINE SULFATE 15 MG PO TABS: 45.0000 mg | ORAL_TABLET | Freq: Two times a day (BID) | ORAL | Status: DC

## 2024-02-27 MED ORDER — MIDAZOLAM HCL 2 MG/2ML IJ SOLN
INTRAMUSCULAR | Status: AC
  Administered 2024-02-27: 2 mg via INTRAVENOUS
  Filled 2024-02-27: qty 2

## 2024-02-27 MED ORDER — IOHEXOL 350 MG/ML SOLN
75.0000 mL | Freq: Once | INTRAVENOUS | Status: AC | PRN
Start: 2024-02-27 — End: 2024-02-27
  Administered 2024-02-27: 75 mL via INTRAVENOUS

## 2024-02-27 MED ORDER — PERPHENAZINE 4 MG PO TABS
24.0000 mg | ORAL_TABLET | Freq: Every day | ORAL | Status: DC
  Filled 2024-02-27: qty 6

## 2024-02-27 MED ORDER — POLYETHYLENE GLYCOL 3350 17 G PO PACK
17.0000 g | PACK | Freq: Every day | ORAL | Status: DC
  Administered 2024-02-27 – 2024-02-28 (×2): 17 g
  Filled 2024-02-27: qty 1

## 2024-02-27 MED ORDER — ALLOPURINOL 300 MG PO TABS: 150.0000 mg | ORAL_TABLET | Freq: Every day | ORAL | Status: DC

## 2024-02-27 MED ORDER — VALPROIC ACID 250 MG/5ML PO SOLN: 250.0000 mg | Freq: Four times a day (QID) | ORAL | Status: DC

## 2024-02-27 MED ORDER — PHENELZINE SULFATE 15 MG PO TABS
45.0000 mg | ORAL_TABLET | Freq: Two times a day (BID) | ORAL | Status: DC
  Administered 2024-02-27: 45 mg
  Filled 2024-02-27 (×3): qty 3

## 2024-02-27 MED ORDER — ATORVASTATIN CALCIUM 40 MG PO TABS: 40.0000 mg | ORAL_TABLET | Freq: Every day | ORAL | Status: DC

## 2024-02-27 MED ORDER — DIVALPROEX SODIUM 125 MG PO CSDR: 500.0000 mg | DELAYED_RELEASE_CAPSULE | Freq: Two times a day (BID) | ORAL | Status: DC

## 2024-02-27 MED ORDER — MAGNESIUM SULFATE 2 GM/50ML IV SOLN
2.0000 g | Freq: Once | INTRAVENOUS | Status: AC
  Administered 2024-02-27: 2 g via INTRAVENOUS
  Filled 2024-02-27: qty 50

## 2024-02-27 MED ORDER — MIDAZOLAM HCL 2 MG/2ML IJ SOLN: 2.0000 mg | Freq: Once | INTRAMUSCULAR | Status: AC

## 2024-02-27 MED ORDER — FAMOTIDINE 20 MG PO TABS
20.0000 mg | ORAL_TABLET | Freq: Two times a day (BID) | ORAL | Status: DC
  Administered 2024-02-27: 20 mg
  Filled 2024-02-27: qty 1

## 2024-02-27 MED ORDER — VALPROATE SODIUM 100 MG/ML IV SOLN
250.0000 mg | Freq: Four times a day (QID) | INTRAVENOUS | Status: DC
  Administered 2024-02-27 – 2024-03-01 (×13): 250 mg via INTRAVENOUS
  Filled 2024-02-27: qty 2.5
  Filled 2024-02-27 (×2): qty 250
  Filled 2024-02-27 (×6): qty 2.5
  Filled 2024-02-27: qty 250
  Filled 2024-02-27 (×3): qty 2.5
  Filled 2024-02-27: qty 250
  Filled 2024-02-27 (×2): qty 2.5
  Filled 2024-02-27: qty 250
  Filled 2024-02-27 (×2): qty 2.5

## 2024-02-27 MED ORDER — POTASSIUM CHLORIDE 20 MEQ PO PACK
40.0000 meq | PACK | Freq: Once | ORAL | Status: AC
  Administered 2024-02-27: 40 meq
  Filled 2024-02-27: qty 2

## 2024-02-27 MED ORDER — BENZTROPINE MESYLATE 0.5 MG PO TABS: 0.5000 mg | ORAL_TABLET | Freq: Two times a day (BID) | ORAL | Status: DC

## 2024-02-27 MED ORDER — ETOMIDATE 2 MG/ML IV SOLN
10.0000 mg | Freq: Once | INTRAVENOUS | Status: AC
  Administered 2024-02-27: 10 mg via INTRAVENOUS

## 2024-02-27 MED ORDER — DOXAZOSIN MESYLATE 1 MG PO TABS
1.0000 mg | ORAL_TABLET | Freq: Every day | ORAL | Status: DC
  Filled 2024-02-27: qty 1

## 2024-02-27 MED ORDER — ROCURONIUM BROMIDE 10 MG/ML (PF) SYRINGE
50.0000 mg | PREFILLED_SYRINGE | Freq: Once | INTRAVENOUS | Status: AC
  Administered 2024-02-27: 50 mg via INTRAVENOUS

## 2024-02-27 MED ORDER — MIDAZOLAM HCL 2 MG/2ML IJ SOLN
2.0000 mg | Freq: Once | INTRAMUSCULAR | Status: AC
  Administered 2024-02-27: 2 mg via INTRAVENOUS

## 2024-02-27 NOTE — Procedures (Signed)
 Intubation Procedure Note  KAHIL AGNER  [id number]  [birth year]  Date:02/27/24  Time:7:07 AM   Provider Performing:Torben Soloway Maree    Procedure: Intubation (31500)  Indication(s) Respiratory Failure  Consent Risks of the procedure as well as the alternatives and risks of each were explained to the patient and/or caregiver.  Consent for the procedure was obtained and is signed in the bedside chart   Anesthesia Etomidate , Versed , and Rocuronium    Time Out Verified patient identification, verified procedure, site/side was marked, verified correct patient position, special equipment/implants available, medications/allergies/relevant history reviewed, required imaging and test results available.   Sterile Technique Usual hand hygeine, masks, and gloves were used   Procedure Description Patient positioned in bed supine.  Sedation given as noted above.  Patient was intubated with endotracheal tube using Glidescope.  View was Grade 1 full glottis .  Number of attempts was 1.  Colorimetric CO2 detector was consistent with tracheal placement.   Complications/Tolerance None; patient tolerated the procedure well. Chest X-ray is ordered to verify placement.   EBL 0   Specimen(s) None

## 2024-02-27 NOTE — Assessment & Plan Note (Deleted)
 Suspect 2/2 deconditioning, progressing dementia  -PT/OT recommending SNF -Fall precautions

## 2024-02-27 NOTE — Consult Note (Signed)
 NAME:  [name], MRN:  [id number], DOB:  28-Jun-1961, LOS: 3 ADMISSION DATE:  02/24/2024, CONSULTATION DATE:  2024/03/10 REFERRING MD:  anders, CHIEF COMPLAINT:  cardiac arrest    History of Present Illness:  [name] is a 63 y.o. M admitted 8/22 after a fall and subsequent traumatic subdural and subarachnoid, PMH significant for afib on eliquis , depression, hypothyroidism, seizures depakote , dementia and barrett's esophagus.   The SAD/SDH were small and he was admitted to the medicine teaching program with plans to possibly admit to inpatient rehab. In the early morning hours of 03-10-2024 he had a cardiac arrest which was not preceded by any hemodynamic instability but had a sudden deterioration of his respiratory status.  He was coded for approximately one minute transported to the ICU where he was intubated.  Pertinent  Medical History   has a past medical history of A-fib (HCC), Acute right-sided low back pain, Anxiety, Arrhythmia, Barrett esophagus, Chronic a-fib (HCC), Closed fracture of left distal radius, Contact dermatitis (04/16/2017), Depression, Depression, Disorientation (04/06/2023), Hypothyroidism, Low back pain, Morbid obesity, BMI unknown (HCC), Obesity, Osteoarthritis, Schizophrenia (HCC), Seizures (HCC), Seizures (HCC), Thyroid  disease, Varicose veins, and Vertigo (11/03/2019).   Significant Hospital Events: Including procedures, antibiotic start and stop dates in addition to other pertinent events   Mar 10, 2024 coded on the floor, intubated, transfer to ICU  Interim History / Subjective:  As above Mother notified and on her way in   Objective    Blood pressure 131/76, pulse 88, temperature 98.7 F (37.1 C), temperature source Axillary, resp. rate 13, height 5' 9 (1.753 m), weight 87.3 kg, SpO2 99%.        Intake/Output Summary (Last 24 hours) at 03/10/2024 0609 Last data filed at 02/26/2024 1729 Gross per 24 hour  Intake 400 ml  Output 700 ml  Net -300 ml   Filed  Weights   02/24/24 1633 02/24/24 1852  Weight: 88 kg 87.3 kg    General:  pale, critically ill-appearing M intubated and sedated  HEENT: MM pink/moist, ETT in place Neuro: examined after RSI medications, paralyzed CV: s1s2 rrr, no m/r/g PULM:  clear bilaterally on mechanical ventilation GI: soft, non-distended  Extremities: warm/dry, no edema     Resolved problem list   Assessment and Plan   Witnessed in hospital cardiac arrest Hypoxic respiratory failure  Recent fall with SDH/SAH Hypomagnesemia Persistent Atrial Fibrillation Hypothyroidism Schizophrenia/Dementia  Coded one round before ROSC, received RSI medications so neuro exam pending Echo yesterday with preserved EF, no R sided failure -MRI two days ago with stable bleed -CT head and CTA chest -EEG and neurology consult  -Fentanyl  for PAD  -replace electrolytes -continue synthroid  and depakote      Best Practice (right click and Reselect all SmartList Selections daily)   Diet/type: NPO DVT prophylaxis SCD Pressure ulcer(s): N/A GI prophylaxis: H2B Lines: N/A Foley:  N/A Code Status:  full code Last date of multidisciplinary goals of care discussion [pending]  Labs   CBC: Recent Labs  Lab 02/24/24 1638 02/24/24 1655 02/25/24 0340  WBC 6.2  --  6.1  HGB 11.1* 11.2* 10.7*  HCT 33.7* 33.0* 32.0*  MCV 88.9  --  87.4  PLT 150  --  133*    Basic Metabolic Panel: Recent Labs  Lab 02/24/24 1638 02/24/24 1655 02/25/24 0812 02/25/24 1451 02/26/24 0633 2024/03/10 0353  NA 133* 132*  --  131* 130* 130*  K 3.1* 3.1*  --  3.8 3.5 3.7  CL 94* 95*  --  99 96* 98  CO2 26  --   --  22 25 24   GLUCOSE 101* 93  --  79 102* 130*  BUN 7* 6*  --  6* 6* 8  CREATININE 0.63 0.70  --  0.64 0.58* 0.53*  CALCIUM  8.9  --   --  8.8* 8.7* 8.7*  MG  --   --  1.5*  --  1.6* 1.7   GFR: Estimated Creatinine Clearance: 100.7 mL/min (A) (by C-G formula based on SCr of 0.53 mg/dL (L)). Recent Labs  Lab 02/24/24 1638  02/24/24 1655 02/25/24 0340  WBC 6.2  --  6.1  LATICACIDVEN  --  0.9  --     Liver Function Tests: Recent Labs  Lab 02/24/24 1638  AST 15  ALT 5  ALKPHOS 72  BILITOT 1.2  PROT 5.5*  ALBUMIN  3.1*   No results for input(s): LIPASE, AMYLASE in the last 168 hours. No results for input(s): AMMONIA in the last 168 hours.  ABG    Component Value Date/Time   TCO2 24 02/24/2024 1655     Coagulation Profile: Recent Labs  Lab 02/24/24 1638  INR 1.4*    Cardiac Enzymes: No results for input(s): CKTOTAL, CKMB, CKMBINDEX, TROPONINI in the last 168 hours.  HbA1C: HbA1c, POC (prediabetic range)  Date/Time Value Ref Range Status  12/14/2022 10:31 AM 6.4 5.7 - 6.4 % Final   HbA1c, POC (controlled diabetic range)  Date/Time Value Ref Range Status  12/21/2023 11:47 AM 5.5 0.0 - 7.0 % Final  09/13/2023 01:51 PM 6.3 0.0 - 7.0 % Final    CBG: Recent Labs  Lab 02/26/24 0613 02/26/24 1119 02/26/24 1552 02/26/24 2123 02/27/24 0551  GLUCAP 141* 144* 118* 141* 153*    Review of Systems:   Unable to obtain   Past Medical History:  He,  has a past medical history of A-fib (HCC), Acute right-sided low back pain, Anxiety, Arrhythmia, Barrett esophagus, Chronic a-fib (HCC), Closed fracture of left distal radius, Contact dermatitis (04/16/2017), Depression, Depression, Disorientation (04/06/2023), Hypothyroidism, Low back pain, Morbid obesity, BMI unknown (HCC), Obesity, Osteoarthritis, Schizophrenia (HCC), Seizures (HCC), Seizures (HCC), Thyroid  disease, Varicose veins, and Vertigo (11/03/2019).   Surgical History:   Past Surgical History:  Procedure Laterality Date   ACHILLES TENDON REPAIR  approx. 2004   right foot   ANTERIOR CERVICAL DECOMP/DISCECTOMY FUSION N/A 04/08/2023   Procedure: Anterior Cervical Discectomy Fusion Cervical Five-Cervical Six;  Surgeon: Lanis Pupa, MD;  Location: [facility] OR;  Service: Neurosurgery;  Laterality: N/A;   CATARACT  EXTRACTION     bilateral   OPEN REDUCTION INTERNAL FIXATION (ORIF) DISTAL RADIAL FRACTURE Left 07/21/2018   Procedure: OPEN REDUCTION INTERNAL FIXATION (ORIF) DISTAL RADIAL FRACTURE;  Surgeon: Murrell Drivers, MD;  Location: [facility];  Service: Orthopedics;  Laterality: Left;   stab phlebectomy  Right 11/09/2016   stab phlebectomy R leg by Lynwood Collum MD   TOOTH EXTRACTION       Social History:   reports that he has never smoked. He has never used smokeless tobacco. He reports that he does not drink alcohol  and does not use drugs.   Family History:  His family history includes Barrett's esophagus in his mother; Diabetes in his brother and father; Esophageal cancer in his father; Heart disease in his father; OCD in his mother. There is no history of Liver disease or Colon cancer.   Allergies Allergies  Allergen Reactions   Glucophage  [Metformin ] Rash   Lamictal  [Lamotrigine ] Rash  Home Medications  Prior to Admission medications   Medication Sig Start Date End Date Taking? Authorizing Provider  allopurinol  (ZYLOPRIM ) 300 MG tablet TAKE 1 TABLET BY MOUTH EVERY DAY Patient taking differently: Take 150 mg by mouth daily. 10/01/22  Yes Malvina Ellen, MD  apixaban  (ELIQUIS ) 5 MG TABS tablet Take 1 tablet (5 mg total) by mouth 2 (two) times daily. 10/19/22  Yes McDiarmid, Krystal BIRCH, MD  atorvastatin  (LIPITOR) 40 MG tablet TAKE 1 TABLET BY MOUTH EVERY DAY Patient taking differently: Take 40 mg by mouth at bedtime. 04/15/22  Yes Lilland, Alana, DO  benztropine  (COGENTIN ) 0.5 MG tablet Take 1 tablet (0.5 mg total) by mouth 2 (two) times daily. 02/23/24  Yes Howell Lunger, DO  divalproex  (DEPAKOTE  ER) 250 MG 24 hr tablet Take 1 tablet (250 mg total) by mouth every morning AND 2 tablets (500 mg total) at bedtime. Patient taking differently: Give 1 tablet (250mg ) by mouth every morning. 12/28/23  Yes McCue, Harlene, NP  divalproex  (DEPAKOTE ) 500 MG DR tablet Take 500 mg by mouth at  bedtime.   Yes [provider]  levothyroxine  (SYNTHROID ) 125 MCG tablet Take 1 tablet (125 mcg total) by mouth daily. 04/16/23  Yes Dameron, Marisa, DO  omeprazole  (PRILOSEC) 40 MG capsule Take 1 capsule (40 mg total) by mouth daily. 07/13/23  Yes Howell Lunger, DO  perphenazine  (TRILAFON ) 8 MG tablet Take 3 tablets (24 mg total) by mouth at bedtime. 12/02/23  Yes Arfeen, Leni DASEN, MD  phenelzine  (NARDIL ) 15 MG tablet Take 3 tablets (45 mg total) by mouth 2 (two) times daily. 12/02/23  Yes Arfeen, Leni DASEN, MD  polyethylene glycol (MIRALAX  / GLYCOLAX ) 17 g packet Take 34 g by mouth daily. Patient taking differently: Take 17 g by mouth every 12 (twelve) hours as needed (constipation). 04/16/23  Yes Dameron, Marisa, DO  Semaglutide ,0.25 or 0.5MG /DOS, 2 MG/1.5ML SOPN Inject 0.5 mg into the skin once a week. 0.25 mg once weekly for 4 weeks then increase to 0.5 mg weekly for at least 4 weeks,max 1 mg 02/11/24  Yes Howell Lunger, DO  senna-docusate (SENOKOT-S) 8.6-50 MG tablet Take 1 tablet by mouth at bedtime. 04/16/23  Yes Dameron, Barabara, DO     Critical care time: 35 minutes     CRITICAL CARE Performed by: Leita SAUNDERS Ova Meegan   Total critical care time: 35 minutes  Critical care time was exclusive of separately billable procedures and treating other patients.  Critical care was necessary to treat or prevent imminent or life-threatening deterioration.  Critical care was time spent personally by me on the following activities: development of treatment plan with patient and/or surrogate as well as nursing, discussions with consultants, evaluation of patient's response to treatment, examination of patient, obtaining history from patient or surrogate, ordering and performing treatments and interventions, ordering and review of laboratory studies, ordering and review of radiographic studies, pulse oximetry and re-evaluation of patient's condition.   Leita SAUNDERS Nalanie Winiecki, PA-C [facility] See Amion for pager If no response to pager , please call [phone] until 7pm After 7:00 pm call Elink  [phone]

## 2024-02-27 NOTE — Progress Notes (Signed)
 Patient seen, examined and chart reviewed  63 year old male who was initially admitted with traumatic small subdural and subarachnoid hemorrhage, course was complicated with PEA cardiac arrest overnight of unclear etiology  He is waking up, following simple commands  Will get EKG and troponins CT head and CTA chest with PE protocol is pending Neurology is following, patient will have EEG after CT head as he has history of seizures on Depakote    Valinda Novas, MD

## 2024-02-27 NOTE — Plan of Care (Signed)
  Problem: Clinical Measurements: Goal: Will remain free from infection Outcome: Progressing   Problem: Pain Managment: Goal: General experience of comfort will improve and/or be controlled Outcome: Progressing   Problem: Safety: Goal: Ability to remain free from injury will improve Outcome: Progressing   Problem: Tissue Perfusion: Goal: Adequacy of tissue perfusion will improve Outcome: Progressing   Problem: Education: Goal: Knowledge of General Education information will improve Description: Including pain rating scale, medication(s)/side effects and non-pharmacologic comfort measures Outcome: Not Progressing   Problem: Health Behavior/Discharge Planning: Goal: Ability to manage health-related needs will improve Outcome: Not Progressing   Problem: Activity: Goal: Risk for activity intolerance will decrease Outcome: Not Progressing   Problem: Nutrition: Goal: Adequate nutrition will be maintained Outcome: Not Progressing

## 2024-02-27 NOTE — Progress Notes (Signed)
 EKG reviewed myself, showing A-fib with controlled rate, incomplete right bundle branch block, similar to prior EKG no new changes.  CT head reviewed myself, showing small subdural hematoma in falx cerebri, not much change from prior  CT angiogram chest with PE protocol, reviewed myself, showing no PE patient has infiltrate in left lower lobe.  Started on antibiotics   Valinda Novas, MD

## 2024-02-27 NOTE — Progress Notes (Signed)
 Pt arrived to unit from 3W ~0535 on NRB 15L, RT performing jaw thrust to maintain airway. Awaited CCM MD to perform RSI for airway protection. Code team MDs at bedside in meantime monitoring.  9444: nasal trumpet placed & HOB raised to 45 degrees with NRB 15L while awaiting CCM MD to bedside  0557: Dr. Maree (CCM) arrived  0600: RT preoxygenating pt with ambubag  0602: 2mg  versed , 10mg  etomidate  given  0603: 50 rocuronium  given, 35F ETT placed by Dr. Maree, 24cm at teeth, confirmed by auscultation and Xray

## 2024-02-27 NOTE — Progress Notes (Signed)
 eLink Physician-Brief Progress Note Patient Name: [name] DOB: 03-Feb-1961 MRN: [id number]   Date of Service  02/27/2024  HPI/Events of Note  63 year old male with a history of frontotemporal dementia, atrial fibrillation and baseline seizure disorder and schizophrenia who initially presented with subdural and subarachnoid hemorrhages after ground-level fall.  On the floor, he had sudden decline in his respiratory status and inability to protect his airway.  He was transition to the ICU for further evaluation and management.  Patient has tachypneic saturating 99% on the ventilator at 50% FiO2.  Results show mild electrolyte disturbances.  CT and MRI were reviewed with the small subarachnoid hemorrhage.  Chest radiograph postintubation reviewed with appropriate positioning of ET tube.  eICU Interventions  Differential is broad including cerebral vasospasm, worsening subarachnoid bleed, or new stroke-anticipate repeat neuroimaging in the setting of worsening mental status.  Consider EEG.  Complete ground team eval pending will follow  Continue crystalloid infusion, consider switching to normal saline with goal sodium 145-150.  Head of bed elevation, avoid fevers.  Maintain mechanical ventilation, daily spontaneous awakening/breathing trials  DVT prophylaxis with SCDs, chemoprophylaxis contraindicated in the setting of ICH GI prophylaxis with famotidine      Intervention Category Evaluation Type: New Patient Evaluation  [name] 02/27/2024, 6:20 AM

## 2024-02-27 NOTE — Progress Notes (Signed)
 LTM EEG hooked up and running - no initial skin breakdown - push button tested - [facility] monitoring.

## 2024-02-27 NOTE — Progress Notes (Signed)
 Approx 01:40 pt with loud labored breathing and extreme tremor BUE and BLE, vitals obtained (WDL).  PT repositioned breathing resumed to reg rate and depth.  RRT nurse notified, when rrt RN arrived pt back to baseline.  Provider notified via epic chat @ [phone].

## 2024-02-27 NOTE — Consult Note (Signed)
 NEUROLOGY CONSULT NOTE   Date of service: February 27, 2024 Patient Name: [name] MRN:  [id number] DOB:  [birth year] Chief Complaint: Respiratory arrest Requesting Provider: Anders Otto DASEN, MD  History of Present Illness  [name] is a 63 y.o. male with hx of FTD, afib (on Eliquis ), epilepsy (semiology staring spells, on VPA), schizophrenia who initially presented to [facility] on 02/24/24 with traumatic SAH & SDH (s/p reversal with Kcentra , no surgical intervention as per NSGY).   On 8/24 AM, patient had respiratory arrest. There was some concern that prior to the arrest he may have had a seizure. He reportedly was flailing his arms and telling his nurse that he was having a seizure. Prior to intubation, he was noted to have dysconjugate gaze which has not been documented previously.  His seizure history is questionable. As per office note 10/13/13 by Dr. Skeet, patient was noted to have staring spells and confusion starting in 1986. EEG then was normal. He was at that time started on Depakote  and has been on it since. While admitted, he has been on it since 8/21 evening.   There is also question of whether he had worsening of his bleed. Since admission, no antithrombotics nor pharmacologic VTE prophylaxis has been started.     ROS  Comprehensive ROS performed and pertinent positives documented in HPI    Past History   Past Medical History:  Diagnosis Date   A-fib (HCC)    Acute right-sided low back pain    Anxiety    Arrhythmia    Barrett esophagus    Chronic a-fib (HCC)    Closed fracture of left distal radius    Contact dermatitis 04/16/2017   Depression    Depression    Disorientation 04/06/2023   Hypothyroidism    Low back pain    Morbid obesity, BMI unknown (HCC)    Obesity    Osteoarthritis    oa in bilateral knees   Schizophrenia (HCC)    Seizures (HCC)    Seizures (HCC)    Thyroid  disease    Varicose veins    Vertigo 11/03/2019    Past  Surgical History:  Procedure Laterality Date   ACHILLES TENDON REPAIR  approx. 2004   right foot   ANTERIOR CERVICAL DECOMP/DISCECTOMY FUSION N/A 04/08/2023   Procedure: Anterior Cervical Discectomy Fusion Cervical Five-Cervical Six;  Surgeon: Lanis Pupa, MD;  Location: [facility] OR;  Service: Neurosurgery;  Laterality: N/A;   CATARACT EXTRACTION     bilateral   OPEN REDUCTION INTERNAL FIXATION (ORIF) DISTAL RADIAL FRACTURE Left 07/21/2018   Procedure: OPEN REDUCTION INTERNAL FIXATION (ORIF) DISTAL RADIAL FRACTURE;  Surgeon: Murrell Drivers, MD;  [facility];  Service: Orthopedics;  Laterality: Left;   stab phlebectomy  Right 11/09/2016   stab phlebectomy R leg by Lynwood Collum MD   TOOTH EXTRACTION      Family History: Family History  Problem Relation Age of Onset   OCD Mother    Barrett's esophagus Mother    Diabetes Father    Heart disease Father    Esophageal cancer Father    Diabetes Brother    Liver disease Neg Hx    Colon cancer Neg Hx     Social History  reports that he has never smoked. He has never used smokeless tobacco. He reports that he does not drink alcohol  and does not use drugs.  Allergies  Allergen Reactions   Glucophage  [Metformin ] Rash   Lamictal  [Lamotrigine ] Rash  Medications   Current Facility-Administered Medications:    acetaminophen  (TYLENOL ) tablet 650 mg, 650 mg, Oral, Q6H PRN **OR** acetaminophen  (TYLENOL ) suppository 650 mg, 650 mg, Rectal, Q6H PRN, Alena Morrison, Reagan, MD   allopurinol  (ZYLOPRIM ) tablet 150 mg, 150 mg, Oral, Daily, Everhart, Kirstie, DO, 150 mg at 02/26/24 9044   atorvastatin  (LIPITOR) tablet 40 mg, 40 mg, Oral, QHS, Everhart, Kirstie, DO, 40 mg at 02/26/24 2212   benztropine  (COGENTIN ) tablet 0.5 mg, 0.5 mg, Oral, BID, Everhart, Kirstie, DO, 0.5 mg at 02/26/24 2212   Chlorhexidine  Gluconate Cloth 2 % PADS 6 each, 6 each, Topical, Daily, Everhart, Kirstie, DO, 6 each at 02/26/24 0900   divalproex   (DEPAKOTE  SPRINKLE) capsule 250 mg, 250 mg, Oral, Q8H, Bronson, Martin, DO, 250 mg at 02/26/24 2212   docusate (COLACE) 50 MG/5ML liquid 100 mg, 100 mg, Per Tube, BID, Gleason, Leita SAUNDERS, PA-C   doxazosin  (CARDURA ) tablet 1 mg, 1 mg, Oral, QHS, Bronson, Martin, DO, 1 mg at 02/26/24 2212   famotidine  (PEPCID ) tablet 20 mg, 20 mg, Per Tube, BID, Gleason, Leita SAUNDERS, PA-C   lactated ringers  infusion, , Intravenous, Continuous, Everhart, Kirstie, DO, Last Rate: 100 mL/hr at 02/26/24 1806, New Bag at 02/26/24 1806   levothyroxine  (SYNTHROID ) tablet 125 mcg, 125 mcg, Oral, Q0600, Everhart, Kirstie, DO, 125 mcg at 02/26/24 9365   magnesium  sulfate IVPB 2 g 50 mL, 2 g, Intravenous, Once, Gleason, Leita SAUNDERS, PA-C   Oral care mouth rinse, 15 mL, Mouth Rinse, 4 times per day, McDiarmid, Krystal BIRCH, MD, 15 mL at 02/26/24 2213   Oral care mouth rinse, 15 mL, Mouth Rinse, PRN, McDiarmid, Krystal BIRCH, MD   perphenazine  (TRILAFON ) tablet 24 mg, 24 mg, Oral, QHS, McDiarmid, Krystal BIRCH, MD, 24 mg at 02/26/24 2212   phenelzine  (NARDIL ) tablet 45 mg, 45 mg, Oral, BID, Everhart, Kirstie, DO, 45 mg at 02/26/24 2212   polyethylene glycol (MIRALAX  / GLYCOLAX ) packet 17 g, 17 g, Per Tube, Daily, Gleason, Leita SAUNDERS, PA-C  Vitals   Vitals:   02/26/24 1551 02/27/24 0136 02/27/24 0504 02/27/24 0600  BP: 131/81 129/74 (!) 173/95 131/76  Pulse: 76 90  88  Resp: 18   13  Temp: 97.8 F (36.6 C) 98.7 F (37.1 C)    TempSrc:  Axillary    SpO2: 94% 93% 100% 99%  Weight:      Height:        Body mass index is 28.42 kg/m.   Physical Exam   Post-paralysis.  PERRL.   Labs/Imaging/Neurodiagnostic studies   CBC:  Recent Labs  Lab 2024-02-29 1638 February 29, 2024 1655 02/25/24 0340  WBC 6.2  --  6.1  HGB 11.1* 11.2* 10.7*  HCT 33.7* 33.0* 32.0*  MCV 88.9  --  87.4  PLT 150  --  133*   Basic Metabolic Panel:  Lab Results  Component Value Date   NA 130 (L) 02/27/2024   K 3.7 02/27/2024   CO2 24 02/27/2024   GLUCOSE 130 (H) 02/27/2024    BUN 8 02/27/2024   CREATININE 0.53 (L) 02/27/2024   CALCIUM  8.7 (L) 02/27/2024   GFRNONAA >60 02/27/2024   GFRAA 103 11/02/2019   Lipid Panel:  Lab Results  Component Value Date   LDLCALC 64 09/08/2022   HgbA1c:  Lab Results  Component Value Date   HGBA1C 5.5 12/21/2023   Urine Drug Screen:     Component Value Date/Time   LABOPIA NONE DETECTED 11/19/2013 1014   COCAINSCRNUR NONE DETECTED 11/19/2013 1014  LABBENZ POSITIVE (A) 11/19/2013 1014   AMPHETMU NONE DETECTED 11/19/2013 1014   THCU NONE DETECTED 11/19/2013 1014   LABBARB NONE DETECTED 11/19/2013 1014    Alcohol  Level     Component Value Date/Time   [facility] <15 02/24/2024 1638   INR  Lab Results  Component Value Date   INR 1.4 (H) 02/24/2024   APTT  Lab Results  Component Value Date   APTT 36 02/24/2024   AED levels: No results found for: PHENYTOIN, ZONISAMIDE, LAMOTRIGINE , LEVETIRACETA   ASSESSMENT   [name] is a 63 y.o. male hx of FTD, afib (on Eliquis ), epilepsy (semiology staring spells, on VPA), schizophrenia who initially presented to [facility] on 02/24/24 with traumatic SAH & SDH (s/p reversal with Kcentra , no surgical intervention as per NSGY). Recently had respiratory arrest, bradycardic to 30's, with about 1.5 minute of chest compressions. He is at risk for seizures due to [facility] as well as reported prior seizure history (although questionable).   RECOMMENDATIONS  - Please let us  know when [facility] is obtained. - Will re-evaluate once paralytics wear off - EEG is reasonable ______________________________________________________________________    Signed, Normie CHRISTELLA Blower, MD [facility]

## 2024-02-27 NOTE — Progress Notes (Signed)
 Please see note by Dr. Sallyann for full consult information.  On my subsequent evaluation, he was able to show thumbs bilaterally.  He is on a relatively low-dose of Depakote  250 3 times daily, and I will increase this to 500 twice daily.(While giving IV, I will use 250 every 6 hours.) check level again tomorrow.  Neurology will follow.   Aisha Seals, MD [facility]   If 7pm- 7am, please page neurology on call as listed in AMION.

## 2024-02-27 NOTE — Progress Notes (Signed)
   02/27/24 0500  Spiritual Encounters  Type of Visit Initial  Conversation partners present during encounter Nurse  Referral source Code page  Reason for visit Code  OnCall Visit Yes  Interventions  Spiritual Care Interventions Made Compassionate presence   Chaplain responded to code blue.  Patient cared for by medical team.  Patient to transfer to 2H. No family/friends at bedside. Chaplain spiritual support services remain available as the need arises.

## 2024-02-27 NOTE — Progress Notes (Signed)
 Approximately 04:57 this nurse noticed PT was bradycardic in the 30's.  Immediately went to bedside with Mindy RN, PT unresponsive, apneic, pulse was not able to be palpated, cyanotic lips.  Compressions immediately started by this RN, shortly thereafter heard PT moaning ROSC attained through femoral pulse.  PT continued to be apneic, rescue breaths utilizing ambu bag continued.  Code team arrived assumed care transported pt to critical care.

## 2024-02-27 NOTE — Progress Notes (Signed)
 Per nurse, patient is currently in CT, and should be back in the room in 30 minutes.  I will attempt LTM EEG at that time.

## 2024-02-27 NOTE — Plan of Care (Signed)
 FMTS Interim Progress Note  S:Paged that pt was coding. Arrived at bedside w/ Dr. Cleotilde and pt already achieved ROSC after 1 round of CPR, no shocks or meds delivered. Pt continued to have respiratory distress, was transferred to ICU and intubated.   Called mom Neal), and updated on events and she is on her way.    O: BP 131/76   Pulse 88   Temp 98.7 F (37.1 C) (Axillary)   Resp 13   Ht 5' 9 (1.753 m)   Wt 87.3 kg   SpO2 99%   BMI 28.42 kg/m    A/P:  Cardiac Arrest  AHRF Differential includes worsening SDH/SAH (new on admission), thromboembolic stroke (from getting Kcentra  to reverse his eliquis ), PE.  - Ordered CTA-PE, CT head w/o con - Transfer to ICU - family updated      Elicia Hamlet, MD 02/27/2024, 6:14 AM PGY-2, [facility] Family Medicine Service pager [phone]

## 2024-02-27 NOTE — Assessment & Plan Note (Deleted)
 Bleeds stable on subsequent head imaging. No new neurological deficits.  - Neurochecks every 4 hours - PT, OT, SLP

## 2024-02-27 NOTE — Progress Notes (Signed)
 RT assisted with patient transport to CT and returned to 2H14 without complications.

## 2024-02-27 NOTE — Assessment & Plan Note (Deleted)
 On Eliquis  prior to admission but will discontinue due to high fall risk and current bleeds.  -Cardiac telemetry

## 2024-02-27 NOTE — TOC Progression Note (Signed)
 Transition of Care [facility]) - Progression Note    Patient Details  Name: [name] MRN: [id number] Date of Birth: [birth year]  Transition of Care [facility]) CM/SW Contact  Gwenn Frieze [place], [place] Phone Number: 02/27/2024, 2:44 PM  Clinical Narrative: Per [place] with [facility], Hulan barrows received and is valid through 8/30. Will follow and assist as indicated.   Frieze Gwenn, MSW, LCSW [phone] (coverage)        Expected Discharge Plan: Skilled Nursing Facility Barriers to Discharge: Continued Medical Work up, English as a second language teacher, Engineer, mining)               Expected Discharge Plan and Services     Post Acute Care Choice: Skilled Nursing Facility Living arrangements for the past 2 months: Assisted Living Facility                                       Social Drivers of Health (SDOH) Interventions SDOH Screenings   Food Insecurity: Patient Declined (02/24/2024)  Housing: Low Risk  (02/24/2024)  Transportation Needs: No Transportation Needs (02/24/2024)  Utilities: Not At Risk (02/24/2024)  Alcohol  Screen: Low Risk  (02/13/2023)  Depression (PHQ2-9): Low Risk  (01/27/2024)  Recent Concern: Depression (PHQ2-9) - Medium Risk (01/03/2024)  Financial Resource Strain: Low Risk  (02/13/2023)  Physical Activity: Inactive (02/13/2023)  Social Connections: Socially Isolated (02/24/2024)  Stress: No Stress Concern Present (02/13/2023)  Tobacco Use: Low Risk  (02/25/2024)  Health Literacy: Adequate Health Literacy (02/13/2023)    Readmission Risk Interventions     No data to display

## 2024-02-27 NOTE — Plan of Care (Signed)

## 2024-02-27 NOTE — Assessment & Plan Note (Deleted)
 Hypothyroidism-continue Synthroid  125mcg Schizophrenia-continue benztropine  0.5mg  BID, perphenazine  24mg  daily, phenelzine  45mg  BID History of seizures-continue Depakote  sprinkle 250mg  q8h Hyperlipidemia-continue atorvastatin  40mg  DM- CBGs well controlled here, stopping outpatient ozempic  Gout-continue allopurinol  150mg  daily

## 2024-02-27 NOTE — Assessment & Plan Note (Deleted)
 New onset, consider 2/2 BPH. Was unable to take Flomax  initially because he did not pass bedside swallow on admission. 16 Fr coude cath inserted 8/22 with good output - Trial cath removal when appropriate - Consider flomax  trial

## 2024-02-28 ENCOUNTER — Encounter (HOSPITAL_COMMUNITY)

## 2024-02-28 ENCOUNTER — Ambulatory Visit: Admitting: Student

## 2024-02-28 DIAGNOSIS — R5381 Other malaise: Secondary | ICD-10-CM

## 2024-02-28 DIAGNOSIS — S065XAA Traumatic subdural hemorrhage with loss of consciousness status unknown, initial encounter: Secondary | ICD-10-CM | POA: Diagnosis not present

## 2024-02-28 DIAGNOSIS — G40909 Epilepsy, unspecified, not intractable, without status epilepticus: Secondary | ICD-10-CM

## 2024-02-28 DIAGNOSIS — I6200 Nontraumatic subdural hemorrhage, unspecified: Secondary | ICD-10-CM | POA: Diagnosis not present

## 2024-02-28 DIAGNOSIS — E119 Type 2 diabetes mellitus without complications: Secondary | ICD-10-CM

## 2024-02-28 DIAGNOSIS — R569 Unspecified convulsions: Secondary | ICD-10-CM

## 2024-02-28 DIAGNOSIS — I4891 Unspecified atrial fibrillation: Secondary | ICD-10-CM | POA: Diagnosis not present

## 2024-02-28 DIAGNOSIS — J9601 Acute respiratory failure with hypoxia: Secondary | ICD-10-CM

## 2024-02-28 DIAGNOSIS — S066XAA Traumatic subarachnoid hemorrhage with loss of consciousness status unknown, initial encounter: Secondary | ICD-10-CM

## 2024-02-28 DIAGNOSIS — G9340 Encephalopathy, unspecified: Secondary | ICD-10-CM

## 2024-02-28 DIAGNOSIS — I469 Cardiac arrest, cause unspecified: Secondary | ICD-10-CM | POA: Diagnosis not present

## 2024-02-28 LAB — GLUCOSE, CAPILLARY
Glucose-Capillary: 87 mg/dL (ref 70–99)
Glucose-Capillary: 86 mg/dL (ref 70–99)
Glucose-Capillary: 85 mg/dL (ref 70–99)
Glucose-Capillary: 96 mg/dL (ref 70–99)
Glucose-Capillary: 88 mg/dL (ref 70–99)

## 2024-02-28 LAB — CBC
HCT: 30.6 % — ABNORMAL LOW (ref 39.0–52.0)
Hemoglobin: 10.1 g/dL — ABNORMAL LOW (ref 13.0–17.0)
MCH: 29.6 pg (ref 26.0–34.0)
MCHC: 33 g/dL (ref 30.0–36.0)
MCV: 89.7 fL (ref 80.0–100.0)
Platelets: 120 K/uL — ABNORMAL LOW (ref 150–400)
RBC: 3.41 MIL/uL — ABNORMAL LOW (ref 4.22–5.81)
RDW: 17 % — ABNORMAL HIGH (ref 11.5–15.5)
WBC: 9.3 K/uL (ref 4.0–10.5)
nRBC: 0 % (ref 0.0–0.2)

## 2024-02-28 LAB — VALPROIC ACID LEVEL: Valproic Acid Lvl: 89 ug/mL (ref 50–100)

## 2024-02-28 MED ORDER — PHENELZINE SULFATE 15 MG PO TABS
45.0000 mg | ORAL_TABLET | Freq: Two times a day (BID) | ORAL | Status: DC
  Administered 2024-02-28 – 2024-02-29 (×3): 45 mg via ORAL
  Filled 2024-02-28 (×5): qty 3

## 2024-02-28 MED ORDER — ALLOPURINOL 300 MG PO TABS
150.0000 mg | ORAL_TABLET | Freq: Every day | ORAL | Status: DC
  Administered 2024-02-29: 150 mg via ORAL
  Filled 2024-02-28: qty 2
  Filled 2024-02-28: qty 1

## 2024-02-28 MED ORDER — LACTATED RINGERS IV BOLUS
1000.0000 mL | Freq: Once | INTRAVENOUS | Status: AC
  Administered 2024-02-28: 1000 mL via INTRAVENOUS

## 2024-02-28 MED ORDER — ENSURE PLUS HIGH PROTEIN PO LIQD: 237.0000 mL | Freq: Two times a day (BID) | ORAL | Status: DC

## 2024-02-28 MED ORDER — DOCUSATE SODIUM 100 MG PO CAPS: 100.0000 mg | ORAL_CAPSULE | Freq: Every day | ORAL | Status: DC | PRN

## 2024-02-28 MED ORDER — LEVOTHYROXINE SODIUM 25 MCG PO TABS
125.0000 ug | ORAL_TABLET | Freq: Every day | ORAL | Status: DC
  Administered 2024-02-29: 125 ug via ORAL
  Filled 2024-02-28: qty 1

## 2024-02-28 MED ORDER — DOCUSATE SODIUM 100 MG PO CAPS
100.0000 mg | ORAL_CAPSULE | Freq: Two times a day (BID) | ORAL | Status: DC
  Administered 2024-02-28: 100 mg via ORAL
  Filled 2024-02-28: qty 1

## 2024-02-28 MED ORDER — DOXAZOSIN MESYLATE 1 MG PO TABS
1.0000 mg | ORAL_TABLET | Freq: Every day | ORAL | Status: DC
  Administered 2024-02-28 – 2024-02-29 (×2): 1 mg via ORAL
  Filled 2024-02-28 (×3): qty 1

## 2024-02-28 MED ORDER — BENZTROPINE MESYLATE 0.5 MG PO TABS
0.5000 mg | ORAL_TABLET | Freq: Two times a day (BID) | ORAL | Status: DC
  Administered 2024-02-28 – 2024-02-29 (×3): 0.5 mg via ORAL
  Filled 2024-02-28 (×5): qty 1

## 2024-02-28 MED ORDER — ATORVASTATIN CALCIUM 40 MG PO TABS
40.0000 mg | ORAL_TABLET | Freq: Every day | ORAL | Status: DC
  Administered 2024-02-28 – 2024-02-29 (×2): 40 mg via ORAL
  Filled 2024-02-28 (×2): qty 1

## 2024-02-28 MED ORDER — HALOPERIDOL LACTATE 5 MG/ML IJ SOLN
2.0000 mg | Freq: Four times a day (QID) | INTRAMUSCULAR | Status: DC | PRN
  Administered 2024-02-28: 2 mg via INTRAVENOUS
  Filled 2024-02-28: qty 1

## 2024-02-28 MED ORDER — PERPHENAZINE 8 MG PO TABS
24.0000 mg | ORAL_TABLET | Freq: Every day | ORAL | Status: DC
  Administered 2024-02-28: 24 mg via ORAL
  Filled 2024-02-28: qty 6
  Filled 2024-02-28: qty 1

## 2024-02-28 MED ORDER — FAMOTIDINE 20 MG PO TABS: 20.0000 mg | ORAL_TABLET | Freq: Two times a day (BID) | ORAL | Status: DC

## 2024-02-28 MED ORDER — MAGNESIUM SULFATE 2 GM/50ML IV SOLN
2.0000 g | Freq: Once | INTRAVENOUS | Status: AC
  Administered 2024-02-28: 2 g via INTRAVENOUS
  Filled 2024-02-28: qty 50

## 2024-02-28 MED ORDER — GUAIFENESIN ER 600 MG PO TB12: 600.0000 mg | ORAL_TABLET | Freq: Two times a day (BID) | ORAL | Status: DC | PRN

## 2024-02-28 MED ORDER — POLYETHYLENE GLYCOL 3350 17 G PO PACK
17.0000 g | PACK | Freq: Every day | ORAL | Status: DC
  Administered 2024-02-29: 17 g via ORAL
  Filled 2024-02-28 (×2): qty 1

## 2024-02-28 MED ORDER — HALOPERIDOL LACTATE 5 MG/ML IJ SOLN
2.0000 mg | Freq: Four times a day (QID) | INTRAMUSCULAR | Status: AC | PRN
  Administered 2024-02-28 – 2024-03-01 (×2): 4 mg via INTRAVENOUS
  Filled 2024-02-28 (×2): qty 1

## 2024-02-28 MED ORDER — FAMOTIDINE IN NACL 20-0.9 MG/50ML-% IV SOLN
20.0000 mg | Freq: Two times a day (BID) | INTRAVENOUS | Status: DC
  Administered 2024-02-28 – 2024-03-01 (×4): 20 mg via INTRAVENOUS
  Filled 2024-02-28 (×4): qty 50

## 2024-02-28 MED ORDER — SODIUM CHLORIDE 0.9 % IV SOLN
3.0000 g | Freq: Four times a day (QID) | INTRAVENOUS | Status: DC
  Administered 2024-02-28 – 2024-03-01 (×8): 3 g via INTRAVENOUS
  Filled 2024-02-28 (×7): qty 8

## 2024-02-28 NOTE — Progress Notes (Signed)
 Physical Therapy Treatment Patient Details Name: [name] MRN: [id number] DOB: 1960-07-31 Today's Date: 02/28/2024   History of Present Illness 63 y.o. male [facility]/24/[facility] after falling with SDH and SAH. 03/10/2024 coded on the floor, intubated, transfer to ICU. Extubated same day. PMHx of dementia, Parkinson's Afib on Eliquis , hypothyroidism, HTN, HLD, seizures, OA, schizophrenia    PT Comments  Agreeable to therapy visit. Mod assist for bed mobility and to stand with heavy retropulsion present upon rising with RW; Max assist with attempt face-to-face no AD to reposition along bed. BP 98/64 seated EOB with 97% SpO2 on 5L. Following commands appropriately but needs assist sequencing. Patient will benefit from continued inpatient follow up therapy, <3 hours/day. Patient will continue to benefit from skilled physical therapy services to further improve independence with functional mobility.     If plan is discharge home, recommend the following: Assistance with cooking/housework;Direct supervision/assist for medications management;Direct supervision/assist for financial management;Assist for transportation;Help with stairs or ramp for entrance;Two people to help with walking and/or transfers;Two people to help with bathing/dressing/bathroom   Can travel by private vehicle     No  Equipment Recommendations  None recommended by PT    Recommendations for Other Services       Precautions / Restrictions Precautions Precautions: Fall Recall of Precautions/Restrictions: Impaired Precaution/Restrictions Comments: mitts. Restrictions Weight Bearing Restrictions Per Provider Order: No     Mobility  Bed Mobility Overal bed mobility: Needs Assistance Bed Mobility: Supine to Sit, Sit to Sidelying, Rolling Rolling: Min assist   Supine to sit: Mod assist, HOB elevated, Used rails   Sit to sidelying: Mod assist General bed mobility comments: Mod assist for trunk support and to  facilitate LEs to edge of bed. feels that he is falling towards Rt, pushes posteriorly once upright. Mod assist for trunk back into bed, able to bring legs up with a little assist. Good bridge to help scoot up in trendelenburg position.    Transfers Overall transfer level: Needs assistance Equipment used: Rolling walker (2 wheels), None Transfers: Sit to/from Stand Sit to Stand: Mod assist, Max assist           General transfer comment: Mod assist for boost to stand with RW, max assist when face to face. pt requires extra time to process with multimodal cues, foward lean prior to pushing through LEs. Pt cannot stand until his trunk is flexed forward with assist. Fatigued quickly after standing but kept stating his pants were falling (he sat back down despitre reassurance that he was only wearing and covered by a gown.)    Ambulation/Gait             Pre-gait activities: weight shift with RW, poorly tolerated General Gait Details: Will require +2   Stairs             Wheelchair Mobility     Tilt Bed    Modified Rankin (Stroke Patients Only)       Balance Overall balance assessment: Needs assistance, History of Falls Sitting-balance support: Feet supported, Bilateral upper extremity supported Sitting balance-Leahy Scale: Poor Sitting balance - Comments: Initially mod assist, progressed to CGA with activities to center. Leans posteriorly. Postural control: Posterior lean Standing balance support: Bilateral upper extremity supported, Reliant on assistive device for balance Standing balance-Leahy Scale: Zero Standing balance comment: retropulsion                            Communication  Communication Communication: Impaired Factors Affecting Communication: Reduced clarity of speech  Cognition Arousal: Lethargic Behavior During Therapy: WFL for tasks assessed/performed   PT - Cognitive impairments: Initiation, Sequencing, Problem solving                          Following commands: Impaired Following commands impaired: Follows one step commands inconsistently, Follows one step commands with increased time    Cueing Cueing Techniques: Verbal cues, Tactile cues, Visual cues, Gestural cues  Exercises General Exercises - Lower Extremity Ankle Circles/Pumps: AROM, Both, 10 reps, Supine Other Exercises Other Exercises: Seated balance, lateral leans, pressure through UEs. tactile cues for midline control with posterior lean.    General Comments General comments (skin integrity, edema, etc.): Seated BP 98/64 HR 70, SpO2 97% on 5L.      Pertinent Vitals/Pain Pain Assessment Pain Assessment: CPOT Facial Expression: Relaxed, neutral Body Movements: Absence of movements Muscle Tension: Relaxed Compliance with ventilator (intubated pts.): N/A Vocalization (extubated pts.): N/A CPOT Total: 0 Pain Intervention(s): Monitored during session    Home Living Family/patient expects to be discharged to:: Assisted living [facility]) Living Arrangements: Alone Available Help at Discharge:  (ALF dependent) Type of Home: Other(Comment) (Assisted living)           Home Equipment: Cane - single point;BSC/3in1;Grab bars - toilet;Grab bars - tub/shower;Rolling Walker (2 wheels) (borrowing w/c from ALF; mother states they are getting one for him.) Additional Comments: Lives at [facility]    Prior Function            PT Goals (current goals can now be found in the care plan section) Acute Rehab PT Goals Patient Stated Goal: to be able to walk PT Goal Formulation: With patient Time For Goal Achievement: 03/10/24 Potential to Achieve Goals: Good Progress towards PT goals: Progressing toward goals    Frequency    Min 2X/week      PT Plan      Co-evaluation              AM-PAC PT 6 Clicks Mobility   Outcome Measure  Help needed turning from your back to your side while in a flat bed without using bedrails?: A  Lot Help needed moving from lying on your back to sitting on the side of a flat bed without using bedrails?: A Lot Help needed moving to and from a bed to a chair (including a wheelchair)?: Total Help needed standing up from a chair using your arms (e.g., wheelchair or bedside chair)?: Total Help needed to walk in hospital room?: Total Help needed climbing 3-5 steps with a railing? : Total 6 Click Score: 8    End of Session Equipment Utilized During Treatment: Gait belt Activity Tolerance: Patient limited by fatigue Patient left: in bed;with call bell/phone within reach;with bed alarm set;with family/visitor present;with SCD's reapplied Nurse Communication: Mobility status;Need for lift equipment Laurent) PT Visit Diagnosis: Unsteadiness on feet (R26.81);Other abnormalities of gait and mobility (R26.89);Muscle weakness (generalized) (M62.81);History of falling (Z91.81);Other symptoms and signs involving the nervous system (R29.898);Difficulty in walking, not elsewhere classified (R26.2)     Time: 8565-8495 PT Time Calculation (min) (ACUTE ONLY): 30 min  Charges:    $Therapeutic Activity: 8-22 mins $Neuromuscular Re-education: 8-22 mins PT General Charges $$ ACUTE PT VISIT: 1 Visit                     Leontine Roads, PT, DPT [facility]  Rehabilitation Services Physical Therapist  Office: [phone] Website: [email or web address]    Leontine GORMAN Roads 02/28/2024, 3:49 PM

## 2024-02-28 NOTE — Consult Note (Signed)
 NAME:  [name], MRN:  [id number], DOB:  August 14, 1960, LOS: 4 ADMISSION DATE:  02/24/2024, CONSULTATION DATE:  02/28/24 REFERRING MD:  anders, CHIEF COMPLAINT:  cardiac arrest    History of Present Illness:  [name] is a 63 y.o. M admitted 8/22 after a fall and subsequent traumatic subdural and subarachnoid, PMH significant for afib on eliquis , depression, hypothyroidism, seizures depakote , dementia and barrett's esophagus.   The SAD/SDH were small and he was admitted to the medicine teaching program with plans to possibly admit to inpatient rehab. In the early morning hours of 03-04-24 he had a cardiac arrest which was not preceded by any hemodynamic instability but had a sudden deterioration of his respiratory status.  He was coded for approximately one minute transported to the ICU where he was intubated.  Pertinent  Medical History   has a past medical history of A-fib (HCC), Acute right-sided low back pain, Anxiety, Arrhythmia, Barrett esophagus, Chronic a-fib (HCC), Closed fracture of left distal radius, Contact dermatitis (04/16/2017), Depression, Depression, Disorientation (04/06/2023), Hypothyroidism, Low back pain, Morbid obesity, BMI unknown (HCC), Obesity, Osteoarthritis, Schizophrenia (HCC), Seizures (HCC), Seizures (HCC), Thyroid  disease, Varicose veins, and Vertigo (11/03/2019).   Significant Hospital Events: Including procedures, antibiotic start and stop dates in addition to other pertinent events   8/22 Fall, traumatic SAH SDH no recs from NSGY but hold eliquis . Admitted to [facility]  March 04, 2024 coded on the floor, intubated, transfer to ICU. Extubated same day  8/25 dc precedex . SLP eval.   Interim History / Subjective:  Back on precedex  overnight   Objective    Blood pressure 95/65, pulse 71, temperature 98.6 F (37 C), temperature source Oral, resp. rate (!) 22, height 5' 9 (1.753 m), weight 87.3 kg, SpO2 98%.    Vent Mode: PSV;CPAP FiO2 (%):  [40 %] 40 % PEEP:  [5  cmH20] 5 cmH20 Pressure Support:  [8 cmH20] 8 cmH20   Intake/Output Summary (Last 24 hours) at 02/28/2024 1102 Last data filed at 02/28/2024 1000 Gross per 24 hour  Intake 1806.28 ml  Output 850 ml  Net 956.28 ml   Filed Weights   02/24/24 1633 02/24/24 1852  Weight: 88 kg 87.3 kg    General:  ill appearing adult M Neuro: sedate. lethargic, dysarthric, 5/5 BUE strength, following some commands + variable cooperation.  HEENT: L eye discharge, sclera are not injected. Very dry mm. EEG in place   CV: irir, cap refill brisk  PULM:  even unlabored. Symmetrical chest expansion  GI:  soft ndnt  Extremities: bilat mittens. No obvious acute joint deformity   Resolved problem list   Assessment and Plan   In hospital arrest -- approx 1 min  -etiology unclear. Chest imaging post arrest makes me wonder if he aspirated/plugged off and became hypoxic preceding  -extubated same day as arrest seemingly without add'l neurologic  P -supportive care   Encephalopathy superimposed on underlying schizophrenia, dementia Traumatic SDH/SAH 2/2 fall  -he is on precedex  this morning which is unfortunately clouding the picture here, especially in light of intracranial process below  -re SDH/SAH -- small. No NSGY notes in chart but early notes reference d/w NSGY and rec just to hold  eliquis   -repeat neuro imaging March 04, 2024 w slightly decr size of SAH/SDH, no mass effect and no new abnormality  P -neuro following, appreciate recs -eeg duration per neuro  -dc precedex  -delirium precautions  -valproate, cogenitn  -fq neuro checks  -PT/OT/SLP  -eliquis  held   Acute hypoxic resp failure  Aspiration PNA  Mucus plugging Hx barretts esophagus, suspected dysphagia P -Unasyn  -pulm hygiene -add PRN mucinex  -add flutter valve  -- might end up needing CPT  -SLP  Afib  -cardiac monitoring -no systemic AC   Hypothyroidism  -synthroid    DM2 -SSI   Urinary retention -foley placed this admission,  cont cardura     Best Practice (right click and Reselect all SmartList Selections daily)   Diet/type: NPO-- SLP to see  DVT prophylaxis SCD Pressure ulcer(s): N/A GI prophylaxis: H2B Lines: N/A Foley:  N/A Code Status:  full code Last date of multidisciplinary goals of care discussion [pending]  Labs   CBC: Recent Labs  Lab 02/24/24 1638 02/24/24 1655 02/25/24 0340 02/27/24 0629 02/28/24 0910  WBC 6.2  --  6.1  --  9.3  HGB 11.1* 11.2* 10.7* 10.2* 10.1*  HCT 33.7* 33.0* 32.0* 30.0* 30.6*  MCV 88.9  --  87.4  --  89.7  PLT 150  --  133*  --  120*    Basic Metabolic Panel: Recent Labs  Lab 02/24/24 1638 02/24/24 1655 02/25/24 0812 02/25/24 1451 02/26/24 0633 02/27/24 0353 02/27/24 0629 02/27/24 2013  NA 133* 132*  --  131* 130* 130* 130* 131*  K 3.1* 3.1*  --  3.8 3.5 3.7 3.3* 4.2  CL 94* 95*  --  99 96* 98  --  97*  CO2 26  --   --  22 25 24   --  27  GLUCOSE 101* 93  --  79 102* 130*  --  108*  BUN 7* 6*  --  6* 6* 8  --  7*  CREATININE 0.63 0.70  --  0.64 0.58* 0.53*  --  0.64  CALCIUM  8.9  --   --  8.8* 8.7* 8.7*  --  8.7*  MG  --   --  1.5*  --  1.6* 1.7  --  1.8  PHOS  --   --   --   --   --   --   --  3.3   GFR: Estimated Creatinine Clearance: 100.7 mL/min (by C-G formula based on SCr of 0.64 mg/dL). Recent Labs  Lab 02/24/24 1638 02/24/24 1655 02/25/24 0340 02/27/24 0918 02/28/24 0910  WBC 6.2  --  6.1  --  9.3  LATICACIDVEN  --  0.9  --  1.5  --     Liver Function Tests: Recent Labs  Lab 02/24/24 1638  AST 15  ALT 5  ALKPHOS 72  BILITOT 1.2  PROT 5.5*  ALBUMIN  3.1*   No results for input(s): LIPASE, AMYLASE in the last 168 hours. No results for input(s): AMMONIA in the last 168 hours.  ABG    Component Value Date/Time   PHART 7.445 02/27/2024 0629   PCO2ART 34.2 02/27/2024 0629   PO2ART 115 (H) 02/27/2024 0629   HCO3 23.5 02/27/2024 0629   TCO2 25 02/27/2024 0629   O2SAT 99 02/27/2024 0629     Coagulation  Profile: Recent Labs  Lab 02/24/24 1638  INR 1.4*    Cardiac Enzymes: No results for input(s): CKTOTAL, CKMB, CKMBINDEX, TROPONINI in the last 168 hours.  HbA1C: HbA1c, POC (prediabetic range)  Date/Time Value Ref Range Status  12/14/2022 10:31 AM 6.4 5.7 - 6.4 % Final   HbA1c, POC (controlled diabetic range)  Date/Time Value Ref Range Status  12/21/2023 11:47 AM 5.5 0.0 - 7.0 % Final  09/13/2023 01:51 PM 6.3 0.0 - 7.0 % Final    CBG: Recent Labs  Lab 02/27/24 1606 02/27/24 2015 02/27/24 2311 02/28/24 0311 02/28/24 0746  GLUCAP 123* 107* 100* 87 86    CRITICAL CARE Performed by: Ronnald FORBES Gave   Total critical care time: 37 minutes  Critical care time was exclusive of separately billable procedures and treating other patients. Critical care was necessary to treat or prevent imminent or life-threatening deterioration.  Critical care was time spent personally by me on the following activities: development of treatment plan with patient and/or surrogate as well as nursing, discussions with consultants, evaluation of patient's response to treatment, examination of patient, obtaining history from patient or surrogate, ordering and performing treatments and interventions, ordering and review of laboratory studies, ordering and review of radiographic studies, pulse oximetry and re-evaluation of patient's condition.  Ronnald Gave MSN, AGACNP-BC [facility] Amion for pager 02/28/2024, 11:02 AM

## 2024-02-28 NOTE — Progress Notes (Signed)
 Initial Nutrition Assessment  DOCUMENTATION CODES:   Not applicable  INTERVENTION:   Add Magic cup BID (lunch and dinner meal), each supplement provides 290 kcal and 9 grams of protein  Add Ensure Plus High Protein po BID between meals, each supplement provides 350 kcal and 20 grams of protein   NUTRITION DIAGNOSIS:   Inadequate oral intake related to decreased appetite, acute illness as evidenced by meal completion < 25%, per patient/family report.  GOAL:   Patient will meet greater than or equal to 90% of their needs  MONITOR:   PO intake, Supplement acceptance, Labs, Weight trends  REASON FOR ASSESSMENT:   Consult Assessment of nutrition requirement/status  ASSESSMENT:   63 yo admitted initially with traumatic small SDH/SAH post ground level fall; pt was near discharge when he lost pulse requiring brief CPR, intubation.  PMH includes frontotemporal dementia (FTD), epilepsy, schizophrenia, developmental delay, anxiety/depression, Barrett esophagus, hypothyroidism, vertigo. No hx of tobacco, EtOH or illicit drug use.  8/21 Admitted post ground level fall with SDH/SAH 8/22 MRI stable bleed 8/23 Echo with preserved EF, no R sided failure 8/24 Code on floor, intubated, brief CPR, transferred to ICU, later extubated  Pt on precedex  overnight, off this AM EEG with moderate to diffuse encephalopathy but no seizure  NPO this AM but diet advanced to Dysphagia 1, Thins after SLP evaluation this AM. Pt endorses odynophagia post intubation  Noted pt started on Dysphagia 1 diet midmorning on 8/22, NPO post arrest on 8/24. Recorded po intake of meals while on Puree diet, 20-25%  Current wt 87.3 kg; unable to obtain diet and weight history at this time. Weight encounters with +wt loss trend  Labs: Sodium 131 (L) Potassium 4.2 (wdl) Phosphorus 3.3 (wdl) Magnesium  1.8 (wdl)  Meds:  IV Unasyn  Cogentin  Colace BID Miralax  daily IV valproate Pepcid   NUTRITION - FOCUSED  PHYSICAL EXAM:  Assess on follow-up as able  Diet Order:   Diet Order             DIET - DYS 1 Room service appropriate? No; Fluid consistency: Thin  Diet effective now                   EDUCATION NEEDS:   Not appropriate for education at this time  Skin:  Skin Assessment: Reviewed RN Assessment  Last BM:  PTA  Height:   Ht Readings from Last 1 Encounters:  02/24/24 5' 9 (1.753 m)    Weight:   Wt Readings from Last 1 Encounters:  02/24/24 87.3 kg    BMI:  Body mass index is 28.42 kg/m.  Estimated Nutritional Needs:   Kcal:  1900-2100 kcals  Protein:  80-90 g  Fluid:  >= 1.8 L   Betsey Finger MS, RDN, LDN, CNSC Registered Dietitian 3 Clinical Nutrition RD Inpatient Contact Info in [place]

## 2024-02-28 NOTE — Procedures (Addendum)
 Patient Name: [name]  MRN: [id number]  Epilepsy Attending: Arlin MALVA Krebs  Referring Physician/Provider: Michaela Aisha SQUIBB, MD  Duration: 02/27/2024 1243 to 02/28/2024 0914  Patient history: 63 y.o. male who initially presented to [facility] on [facility]/24/24 with traumatic SAH & SDH (s/p reversal with Kcentra , no surgical intervention as per NSGY). Recently had respiratory arrest, bradycardic to 30's, with about 1.5 minute of chest compressions. EEG to evaluate for seizure  Level of alertness: Awake, asleep  AEDs during EEG study: VPA  Technical aspects: This EEG study was done with scalp electrodes positioned according to the 10-20 International system of electrode placement. Electrical activity was reviewed with band pass filter of 1-70Hz , sensitivity of 7 uV/mm, display speed of 76mm/sec with a 60Hz  notched filter applied as appropriate. EEG data were recorded continuously and digitally stored.  Video monitoring was available and reviewed as appropriate.  Description: EEG showed continuous generalized polymorphic 3 to 6 Hz theta-delta slowing, at times sharply contoured in left occipital region. Sleep was characterized by vertex waves, sleep spindles (12 to 14 Hz), maximal frontocentral region.    Event button was pressed on 02/27/2024 at 1255 and 1258 for arm shaking. Concomitant EEG before, during and after the event did not show any EEG changes suggest seizure.  Hyperventilation and photic stimulation were not performed.     ABNORMALITY - Continuous slow, generalized  IMPRESSION: This study is suggestive of moderate to severe diffuse encephalopathy. No seizures or definite epileptiform discharges were seen throughout the recording.  Event button was pressed on 02/27/2024 at 1255 and 1258 for arm shaking without concomitant EEG change. These events were most likely NOT epileptic.   Arlind Klingerman O [employer]iner Meals

## 2024-02-28 NOTE — Progress Notes (Signed)
 Speech Language Pathology Treatment: Dysphagia;Cognitive-Linguistic  Patient Details Name: [name] MRN: [id number] DOB: [birth year] Today's Date: 02/28/2024 Time: 9048-8988 SLP Time Calculation (min) (ACUTE ONLY): 20 min  Assessment / Plan / Recommendation Clinical Impression  Pt required cueing to sustain alertness and attention to functional tasks throughout the session. He was oriented to self only. His voice appears hoarse and he endorses odynophagia after <24 hour intubation. His intelligibility was further impacted by dysarthria, which has persisted since the initial evaluation and per previous notes, is different from baseline. In terms of swallowing, his presentation appears overall similar to evaluation 8/22. Pt agreeable to trying ice chips, thin liquids purees, and soft solids, all of which which intermittently resulted in productive coughing, often after a significant delay. He swallows multiple times, often grimacing with persistent reports of globus. Recommend resuming Dys 1 solids with thin liquids and meds given crushed with puree. Question impact of esophageal history on performance today but recommend ongoing SLP f/u for further assessment given acute cognitive deficits.    HPI HPI: [name] is a 63 y.o. male w/PMHx of Parkinson's Afib on Eliquis , hypothyroidism, HTN, HLD, Barrett's esophagus, OA, seizures, schizophrenia, and frontotemporal dementia presenting after fall with head strike. CT head showed small acute SAH along medial right frontal lobe and acute SDH along the falx. The pt did not pass the yale, with no coughing but stopping partially through the 3 oz. Cardiac arrest with code for one round before ROSC, transferred to ICU and intubated 8/24 (<24 hrs).      SLP Plan  Continue with current plan of care          Recommendations  Diet recommendations: Dysphagia 1 (puree);Thin liquid Liquids provided via: Cup;Straw Medication Administration: Crushed  with puree Supervision: Staff to assist with self feeding;Full supervision/cueing for compensatory strategies Compensations: Minimize environmental distractions;Slow rate;Small sips/bites Postural Changes and/or Swallow Maneuvers: Seated upright 90 degrees;Upright 30-60 min after meal                  Oral care QID;Staff/trained caregiver to provide oral care;Oral care before and after PO   Frequent or constant Supervision/Assistance Cognitive communication deficit (R41.841);Dysphagia, unspecified (R13.10)     Continue with current plan of care     Damien Blumenthal, M.A., CCC-SLP Speech Language Pathology, Acute Rehabilitation Services  Secure Chat preferred [phone]   02/28/2024, 10:40 AM

## 2024-02-28 NOTE — Progress Notes (Signed)
 LTM EEG D/C'd. Patient had no noted skin break down and [facility] was notified.

## 2024-02-28 NOTE — Progress Notes (Signed)
 NEUROLOGY CONSULT FOLLOW UP NOTE   Date of service: February 28, 2024 Patient Name: [name] MRN:  [id number] DOB:  01/24/1961  Interval Hx/subjective   Yesterday's repeat CTH shows stable/slight decrease in SAH/SDH EEG read negative. Continued on Depakote .  Placed on Precedex  overnight, recently stopped.   Vitals   Vitals:   02/28/24 0700 02/28/24 0744 02/28/24 0800 02/28/24 0807  BP: 109/66   106/72  Pulse: 69 75 76 75  Resp: 18 17 19 16   Temp:  98.6 F (37 C) 98.6 F (37 C)   TempSrc:  Oral Oral   SpO2:      Weight:      Height:         Body mass index is 28.42 kg/m.  Physical Exam   Constitutional: Appears well-developed and well-nourished.  Cardiovascular: Normal rate and regular rhythm on monitor.   Respiratory: Effort normal, non-labored breathing. 2L [place]   Neurologic Examination   Precedex  was recently stopped prior to exam.   Neuro: Mental Status: Patient is lethargic, but awakens to name. Oriented to person, place, year.  Requires repeated stimulation to stay interactive, follow commands.  No signs of aphasia or neglect Cranial Nerves: II: Visual Fields are full. Pupils are equal, round, and reactive to light.   III,IV, VI: EOMI without ptosis or diploplia.  V: Facial sensation is symmetric to light touch VII: Facial movement is symmetric.  VIII: hearing is intact to voice X:  No dysarthria or hoarseness.  XI: Shoulder shrug is symmetric. XII: tongue is midline without atrophy or fasciculations.  Motor: Tone is normal. Bulk is normal.  Generalized weakness present, no focal deficits appreciated.  Sensory: Sensation is symmetric to light touch in the arms and legs. Cerebellar: FNF intact bilaterally, weak and slow attributed to drowsiness.   Medications  Current Facility-Administered Medications:    acetaminophen  (TYLENOL ) tablet 650 mg, 650 mg, Oral, Q6H PRN **OR** acetaminophen  (TYLENOL ) suppository 650 mg, 650 mg, Rectal, Q6H PRN,  Alena Morrison, Reagan, MD   allopurinol  (ZYLOPRIM ) tablet 150 mg, 150 mg, Per Tube, Daily, Harold Scholz, MD   Ampicillin -Sulbactam (UNASYN ) 3 g in sodium chloride  0.9 % 100 mL IVPB, 3 g, Intravenous, Q6H, Harold Scholz, MD, Stopped at 02/28/24 9361   atorvastatin  (LIPITOR) tablet 40 mg, 40 mg, Per Tube, QHS, Harold Scholz, MD   benztropine  (COGENTIN ) tablet 0.5 mg, 0.5 mg, Per Tube, BID, Harold Scholz, MD, 0.5 mg at 02/27/24 1200   Chlorhexidine  Gluconate Cloth 2 % PADS 6 each, 6 each, Topical, Daily, Everhart, Kirstie, DO, 6 each at 02/27/24 0956   docusate (COLACE) 50 MG/5ML liquid 100 mg, 100 mg, Per Tube, BID, Gleason, Leita SAUNDERS, PA-C, 100 mg at 02/27/24 1050   doxazosin  (CARDURA ) tablet 1 mg, 1 mg, Per Tube, QHS, Chand, Sudham, MD   famotidine  (PEPCID ) tablet 20 mg, 20 mg, Per Tube, BID, Gleason, Leita SAUNDERS, PA-C, 20 mg at 02/27/24 1052   levothyroxine  (SYNTHROID ) tablet 125 mcg, 125 mcg, Per Tube, Q0600, Harold Scholz, MD   magnesium  sulfate IVPB 2 g 50 mL, 2 g, Intravenous, Once, Bowser, Ronnald BRAVO, NP   Oral care mouth rinse, 15 mL, Mouth Rinse, 4 times per day, McDiarmid, Krystal BIRCH, MD, 15 mL at 02/27/24 2118   Oral care mouth rinse, 15 mL, Mouth Rinse, PRN, McDiarmid, Krystal BIRCH, MD   perphenazine  (TRILAFON ) tablet 24 mg, 24 mg, Per Tube, QHS, Chand, Sudham, MD   phenelzine  (NARDIL ) tablet 45 mg, 45 mg, Per Tube, BID, Harold Scholz, MD,  45 mg at 02/27/24 1200   polyethylene glycol (MIRALAX  / GLYCOLAX ) packet 17 g, 17 g, Per Tube, Daily, Gleason, Leita SAUNDERS, PA-C, 17 g at 02/27/24 1050   valproate (DEPACON ) 250 mg in dextrose  5 % 50 mL IVPB, 250 mg, Intravenous, Q6H, Michaela Aisha SQUIBB, MD, Paused at 02/28/24 0538  Labs and Diagnostic Imaging   CBC:  Recent Labs  Lab 02/24/24 1638 02/24/24 1655 02/25/24 0340 02/27/24 0629  WBC 6.2  --  6.1  --   HGB 11.1*   < > 10.7* 10.2*  HCT 33.7*   < > 32.0* 30.0*  MCV 88.9  --  87.4  --   PLT 150  --  133*  --    < > = values in this interval not  displayed.    Basic Metabolic Panel:  Lab Results  Component Value Date   NA 131 (L) 02/27/2024   K 4.2 02/27/2024   CO2 27 02/27/2024   GLUCOSE 108 (H) 02/27/2024   BUN 7 (L) 02/27/2024   CREATININE 0.64 02/27/2024   CALCIUM  8.7 (L) 02/27/2024   GFRNONAA >60 02/27/2024   GFRAA 103 11/02/2019   Lipid Panel:  Lab Results  Component Value Date   LDLCALC 64 09/08/2022   HgbA1c:  Lab Results  Component Value Date   HGBA1C 5.5 12/21/2023   Urine Drug Screen:     Component Value Date/Time   LABOPIA NONE DETECTED 11/19/2013 1014   COCAINSCRNUR NONE DETECTED 11/19/2013 1014   LABBENZ POSITIVE (A) 11/19/2013 1014   AMPHETMU NONE DETECTED 11/19/2013 1014   THCU NONE DETECTED 11/19/2013 1014   LABBARB NONE DETECTED 11/19/2013 1014    Alcohol  Level     Component Value Date/Time   ETH <15 02/24/2024 1638   INR  Lab Results  Component Value Date   INR 1.4 (H) 02/24/2024   APTT  Lab Results  Component Value Date   APTT 36 02/24/2024   AED levels: No results found for: PHENYTOIN, ZONISAMIDE, LAMOTRIGINE , LEVETIRACETA  02/24/2024 CT Head without contrast(Personally reviewed): Small-volume acute subarachnoid hemorrhage along the medial right frontal lobe. Acute subdural hemorrhage along the falx (measuring up to 4 mm in thickness).  02/25/2024 CT Head (Personally reviewed): No change or worsening since yesterday. Small volume subarachnoid hemorrhage medial to the upper right hemisphere. Small volume subdural hematoma along the falx, maximal thickness 3.4 mm. No mass effect or hydrocephalus.  02/27/2024 CT Head (Personally reviewed): Small volume scattered SAH, para falcine and tentorial SDH, is stable to slightly decreased from 2 days ago.  02/25/2024 MRI Brain(Personally reviewed): Small volume of scattered SAH, para falcine and right tentorial SDH not significantly changed from recent head CTs and compatible with sequelae of trauma. No other acute  intracranial abnormality identified.  LTM EEG  02/27/2024 1243 to 02/28/2024 0800:  This study is suggestive of moderate to severe diffuse encephalopathy. No seizures or definite epileptiform discharges were seen throughout the recording.   Event button was pressed on 02/27/2024 at 1255 and 1258 for arm shaking without concomitant EEG change. These events were most likely NOT epileptic.   Assessment   IVO MOGA is a 63 y.o. male hx of FTD, afib (on Eliquis ), epilepsy (semiology staring spells, on VPA), schizophrenia who initially presented to [facility] on 02/24/24 with traumatic SAH & SDH (s/p reversal with Kcentra , no surgical intervention as per NSGY).   8/24 overnight, patient had respiratory arrest, bradycardic to 30's, with about 1.5 minute of chest compressions. With history of seizures and  new SAH/SDH, he is at risk for further seizures and has been placed on LTM EEG, which was negative on read this morning.  Patient's home dose of Depakote  (250mg  TID) was increased to 500mg  BID or 250mg  Q6H IV. VPA level 8/22 86. With history of seizures and further increased risk due to [facility], recommend continuing the increased dose for now but will check a level today. Patient will need to follow-up with his outpatient neurologist for further AED management.   Repeat CTH shows stable to slightly decreased SAH/SDH. We are now 3 days out from initial head CT showing scattered SAH and tentorial SDH. Patient has now had multiple falls and multiple traumatic bleeds. The risks of bleeding outweigh the benefits at this time. Patient should follow-up with his outpatient neurologist for further anti-thrombotic recommendations and management.   Recommendations   - DC LTM EEG - continue increased dose of Depakote  - will check level today  - follow-up with outpatient neurology for continued AED management and further antithrombotic  recommendations   ______________________________________________________________________   Signed, Rocky JAYSON Likes, NP [facility]    Attending Neurohospitalist Addendum Patient seen and examined with APP/Resident. Agree with the history and physical as documented above. Agree with the plan as documented, which I helped formulate. I have edited the note above to reflect my full findings and recommendations. I have independently reviewed the chart, obtained history, review of systems and examined the patient.I have personally reviewed pertinent head/neck/spine imaging (CT/MRI). Please feel free to call with any questions.  Will f/u tomorrow regarding depakote  level and antiplatelet/anticoagulation recommendations for stroke prevention in the setting of a fib and recent traumatic SAH/SDH as well as frequent falls. Patient should follow up with Dr. Margaret her established outpatient neurologist after discharge for both AED mgmt and further mgmt of antiplatelets/anticoagulation for stroke risk reduction in the setting of a fib.  -- Elida Ross, MD [facility] [phone]  If 7pm- 7am, please page neurology on call as listed in [facility].

## 2024-02-28 NOTE — Progress Notes (Signed)
 Some sundowning -- probably compounded by not being able to take some of his usual meds   Added some PRN haldol . His qtc is fine, this can be further adjusted if needed.     Ronnald Gave MSN, AGACNP-BC [facility] Pulmonary/Critical Care Medicine 02/28/2024, 6:55 PM

## 2024-02-28 NOTE — Progress Notes (Signed)
 RT attempted to instruct the patient on the use of a flutter valve. Patient refused several times to try to blow through the flutter valve and states leave me alone. CCM NP notified.

## 2024-02-28 NOTE — Plan of Care (Signed)

## 2024-02-29 ENCOUNTER — Inpatient Hospital Stay (HOSPITAL_COMMUNITY)

## 2024-02-29 DIAGNOSIS — S065XAA Traumatic subdural hemorrhage with loss of consciousness status unknown, initial encounter: Secondary | ICD-10-CM | POA: Diagnosis not present

## 2024-02-29 DIAGNOSIS — Z87898 Personal history of other specified conditions: Secondary | ICD-10-CM

## 2024-02-29 DIAGNOSIS — J690 Pneumonitis due to inhalation of food and vomit: Secondary | ICD-10-CM | POA: Diagnosis not present

## 2024-02-29 DIAGNOSIS — G40909 Epilepsy, unspecified, not intractable, without status epilepticus: Secondary | ICD-10-CM | POA: Diagnosis not present

## 2024-02-29 DIAGNOSIS — J9601 Acute respiratory failure with hypoxia: Secondary | ICD-10-CM

## 2024-02-29 DIAGNOSIS — S066XAA Traumatic subarachnoid hemorrhage with loss of consciousness status unknown, initial encounter: Secondary | ICD-10-CM | POA: Diagnosis not present

## 2024-02-29 DIAGNOSIS — I4891 Unspecified atrial fibrillation: Secondary | ICD-10-CM | POA: Diagnosis not present

## 2024-02-29 DIAGNOSIS — I6200 Nontraumatic subdural hemorrhage, unspecified: Secondary | ICD-10-CM | POA: Diagnosis not present

## 2024-02-29 LAB — GLUCOSE, CAPILLARY
Glucose-Capillary: 78 mg/dL (ref 70–99)
Glucose-Capillary: 79 mg/dL (ref 70–99)
Glucose-Capillary: 95 mg/dL (ref 70–99)
Glucose-Capillary: 95 mg/dL (ref 70–99)
Glucose-Capillary: 82 mg/dL (ref 70–99)

## 2024-02-29 LAB — CBC
HCT: 29.5 % — ABNORMAL LOW (ref 39.0–52.0)
Hemoglobin: 9.9 g/dL — ABNORMAL LOW (ref 13.0–17.0)
MCH: 29.7 pg (ref 26.0–34.0)
MCHC: 33.6 g/dL (ref 30.0–36.0)
MCV: 88.6 fL (ref 80.0–100.0)
Platelets: 102 K/uL — ABNORMAL LOW (ref 150–400)
RBC: 3.33 MIL/uL — ABNORMAL LOW (ref 4.22–5.81)
RDW: 16.8 % — ABNORMAL HIGH (ref 11.5–15.5)
WBC: 7.5 K/uL (ref 4.0–10.5)
nRBC: 0 % (ref 0.0–0.2)

## 2024-02-29 LAB — BLOOD GAS, VENOUS
Acid-Base Excess: 5.8 mmol/L — ABNORMAL HIGH (ref 0.0–2.0)
Bicarbonate: 32.5 mmol/L — ABNORMAL HIGH (ref 20.0–28.0)
O2 Saturation: 31.7 %
Patient temperature: 36.8
pCO2, Ven: 55 mmHg (ref 44–60)
pH, Ven: 7.38 (ref 7.25–7.43)
pO2, Ven: <31 mmHg — CL (ref 32–45)

## 2024-02-29 LAB — BASIC METABOLIC PANEL WITH GFR
Anion gap: 8 (ref 5–15)
BUN: 8 mg/dL (ref 8–23)
CO2: 24 mmol/L (ref 22–32)
Calcium: 8.2 mg/dL — ABNORMAL LOW (ref 8.9–10.3)
Chloride: 99 mmol/L (ref 98–111)
Creatinine, Ser: 0.58 mg/dL — ABNORMAL LOW (ref 0.61–1.24)
GFR, Estimated: >60 mL/min (ref >60)
Glucose, Bld: 83 mg/dL (ref 70–99)
Potassium: 4 mmol/L (ref 3.5–5.1)
Sodium: 131 mmol/L — ABNORMAL LOW (ref 135–145)

## 2024-02-29 MED ORDER — PERPHENAZINE 4 MG PO TABS
24.0000 mg | ORAL_TABLET | Freq: Every day | ORAL | Status: DC
  Administered 2024-02-29: 24 mg via ORAL
  Filled 2024-02-29 (×2): qty 6

## 2024-02-29 MED ORDER — ASPIRIN 300 MG RE SUPP
300.0000 mg | Freq: Every day | RECTAL | Status: DC
  Administered 2024-03-01: 300 mg via RECTAL
  Filled 2024-02-29: qty 1

## 2024-02-29 MED ORDER — ASPIRIN 81 MG PO CHEW
81.0000 mg | CHEWABLE_TABLET | Freq: Every day | ORAL | Status: DC
  Administered 2024-02-29: 81 mg via ORAL
  Filled 2024-02-29: qty 1

## 2024-02-29 MED FILL — Dextrose Inj 5%: INTRAVENOUS | Qty: 50 | Status: AC

## 2024-02-29 MED FILL — Valproate Sodium Inj 100 MG/ML: INTRAVENOUS | Qty: 2.5 | Status: AC

## 2024-02-29 NOTE — Plan of Care (Signed)
   Problem: Education: Goal: Knowledge of General Education information will improve Description: Including pain rating scale, medication(s)/side effects and non-pharmacologic comfort measures Outcome: Progressing   Problem: Clinical Measurements: Goal: Ability to maintain clinical measurements within normal limits will improve Outcome: Progressing   Problem: Clinical Measurements: Goal: Diagnostic test results will improve Outcome: Progressing   Problem: Clinical Measurements: Goal: Respiratory complications will improve Outcome: Progressing

## 2024-02-29 NOTE — Assessment & Plan Note (Addendum)
 Most likely 2/2 BPH. Was unable to take Flomax  initially because he did not pass bedside swallow on admission. 16 Fr coude cath inserted 8/22 with good output - Trial cath removal when appropriate - Consider flomax  trial

## 2024-02-29 NOTE — TOC Progression Note (Signed)
 Transition of Care [facility]) - Progression Note    Patient Details  Name: [name] MRN: [id number] Date of Birth: [birth year]  Transition of [facility] CM/SW Contact  Almarie CHRISTELLA Goodie, [place] Phone Number: 02/29/2024, 10:42 AM  Clinical Narrative:   CSW spoke with patient's mother, Dana, to answer questions and provide update. Lonell confirmed plan to admit to [facility] once medically stable, she is hopeful to go check it out this afternoon after she visits the patient.   CSW uploaded documentation to [facility] for review for PASRR, awaiting PASRR number. CSW to follow.    Expected Discharge Plan: Skilled Nursing Facility Barriers to Discharge: Continued Medical Work up, Awaiting State Approval Financial controller)               Expected Discharge Plan and Services     Post Acute Care Choice: Skilled Nursing Facility Living arrangements for the past 2 months: Assisted Living Facility                                       Social Drivers of Health (SDOH) Interventions SDOH Screenings   Food Insecurity: Patient Declined (02/24/2024)  Housing: Low Risk  (02/24/2024)  Transportation Needs: No Transportation Needs (02/24/2024)  Utilities: Not At Risk (02/24/2024)  Alcohol  Screen: Low Risk  (02/13/2023)  Depression (PHQ2-9): Low Risk  (01/27/2024)  Recent Concern: Depression (PHQ2-9) - Medium Risk (01/03/2024)  Financial Resource Strain: Low Risk  (02/13/2023)  Physical Activity: Inactive (02/13/2023)  Social Connections: Socially Isolated (02/24/2024)  Stress: No Stress Concern Present (02/13/2023)  Tobacco Use: Low Risk  (02/25/2024)  Health Literacy: Adequate Health Literacy (02/13/2023)    Readmission Risk Interventions     No data to display

## 2024-02-29 NOTE — Progress Notes (Signed)
 At 0609, patient's blood sugar is 78, patient drank orange juice , blood sugar to be rechecked after 30 minutes.  At 0650- patient's blood sugar went up to 79 mg/dl.breakfast confirmed to arrive at 0700 per dietary.

## 2024-02-29 NOTE — Progress Notes (Addendum)
 Occupational Therapy Treatment Patient Details Name: [name] MRN: [id number] DOB: Apr 03, 1961 Today's Date: 02/29/2024   History of present illness 63 y.o. male presents to [facility] 02/24/24 after falling with SDH and SAH. 03-11-2024 coded on the floor, intubated, transfer to ICU. Extubated same day. PMHx of dementia, Parkinson's Afib on Eliquis , hypothyroidism, HTN, HLD, seizures, OA, schizophrenia   OT comments  Eyes closed majority of session with pt intermittently following commands. Significant functional decline as compared to OT eval, requiring Max A +2 for bed mobility and overall Max to total A for ADL tasks due to below listed deficits. Patient will benefit from continued inpatient follow up therapy, <3 hours/day to maximize functional level of independence. Acute OT to follow.       If plan is discharge home, recommend the following:  Assistance with cooking/housework;Supervision due to cognitive status;Two people to help with walking and/or transfers;Two people to help with bathing/dressing/bathroom   Equipment Recommendations  None recommended by OT (defer to next level of care)    Recommendations for Other Services      Precautions / Restrictions Precautions Precautions: Fall Recall of Precautions/Restrictions: Impaired Precaution/Restrictions Comments: mitts; posey belt Restrictions Weight Bearing Restrictions Per Provider Order: No       Mobility Bed Mobility Overal bed mobility: Needs Assistance Bed Mobility: Supine to Sit, Sit to Sidelying   Sidelying to sit: HOB elevated, Used rails, Max assist, +2 for physical assistance     Sit to sidelying: Max assist, +2 for safety/equipment      Transfers                   General transfer comment: attempted with RW in front pf pt; hands on RW however pt with eyes clsed adn posterior lean     Balance     Sitting balance-Leahy Scale: Zero                                     ADL either  performed or assessed with clinical judgement   ADL Overall ADL's : Needs assistance/impaired Eating/Feeding: Bed level;Maximal assistance   Grooming: Sitting;Wash/dry face;Maximal assistance - attempted grooming with hand over hand; pt blowing on [place] instead of using it to brush his teeth; hand over hand to wipe face however no initiation of carry over with task   Upper Body Bathing: Sitting;Maximal assistance   Lower Body Bathing: Total assistance;Bed level   Upper Body Dressing : Total assistance;Bed level   Lower Body Dressing: Total assistance;Bed level               Functional mobility during ADLs:  (unable to stand today; significant posterior lean)      Extremity/Trunk Assessment Upper Extremity Assessment Upper Extremity Assessment: Generalized weakness (diffiuclty maintaining grasp on [place] or [place]te anc compleitng hand to mouth movement pattern)   Lower Extremity Assessment Lower Extremity Assessment: Generalized weakness        Vision   Saccades:  (gaze always goes back to midline before transition to next object) Visual Fields:  (NT) Diplopia Assessment:  (denies) Additional Comments: eyes closed majority of session   Perception Perception Perception: Impaired (suspect some inattention on the L, will furhter assess)   Praxis Praxis Praxis: Impaired Praxis Impairment Details: Motor planning;Organization;Initiation   Communication Communication Communication: Impaired Factors Affecting Communication: Reduced clarity of speech   Cognition Arousal: Lethargic Behavior During Therapy: Restless, Impulsive Cognition: Cognition impaired  Orientation impairments: Place, Time, Situation Awareness: Online awareness impaired, Intellectual awareness impaired Memory impairment (select all impairments): Working [employer] fast streamer, Conservation officer, historic buildings, Short-term memory Attention impairment (select first level of impairment): Sustained  attention Executive functioning impairment (select all impairments): Problem solving, Reasoning, Sequencing, Organization, Initiation                   Following commands: Impaired Following commands impaired: Follows one step commands inconsistently, Follows one step commands with increased time      Cueing   Cueing Techniques: Verbal cues, Tactile cues, Visual cues, Gestural cues  Exercises      Shoulder Instructions       General Comments      Pertinent Vitals/ Pain       Pain Assessment Pain Assessment: Faces Faces Pain Scale: Hurts little more Pain Location: generalized Pain Descriptors / Indicators: Discomfort, Grimacing Pain Intervention(s): Limited activity within patient's tolerance  Home Living                                          Prior Functioning/Environment              Frequency  Min 2X/week        Progress Toward Goals  OT Goals(current goals can now be found in the care plan section)  Progress towards OT goals: Not progressing toward goals - comment;OT to reassess next treatment (decline in functional status)  Acute Rehab OT Goals Patient Stated Goal: none stated OT Goal Formulation: With patient Time For Goal Achievement: 03/10/24 Potential to Achieve Goals: Fair ADL Goals Pt Will Perform Grooming: standing;with contact guard assist Pt Will Perform Lower Body Bathing: with set-up;sitting/lateral leans Pt Will Perform Lower Body Dressing: sit to/from stand;with supervision Pt Will Transfer to Toilet: with supervision;bedside commode;stand pivot transfer  Plan      Co-evaluation          OT goals addressed during session:  (vision)      AM-PAC OT 6 Clicks Daily Activity     Outcome Measure   Help from another person eating meals?: A Lot Help from another person taking care of personal grooming?: A Lot Help from another person toileting, which includes using toliet, bedpan, or urinal?: Total Help  from another person bathing (including washing, rinsing, drying)?: Total Help from another person to put on and taking off regular upper body clothing?: Total Help from another person to put on and taking off regular lower body clothing?: Total 6 Click Score: 8    End of Session Equipment Utilized During Treatment: Gait belt;Rolling walker (2 wheels)  OT Visit Diagnosis: Unsteadiness on feet (R26.81);Other abnormalities of gait and mobility (R26.89);History of falling (Z91.81);Pain;Other symptoms and signs involving cognitive function;Muscle weakness (generalized) (M62.81) Pain - part of body:  (neck)   Activity Tolerance Patient tolerated treatment well   Patient Left in bed;with call bell/phone within reach;with bed alarm set;with restraints reapplied   Nurse Communication Mobility status;Need for lift equipment        Time: 1110-1132 OT Time Calculation (min): 22 min  Charges: OT General Charges $OT Visit: 1 Visit OT Treatments $Self Care/Home Management : 8-22 mins  Kreg Sink, OT/L   Acute OT Clinical Specialist Acute Rehabilitation Services Pager [phone] Office [phone]   [facility] 02/29/2024, 1:29 PM

## 2024-02-29 NOTE — Assessment & Plan Note (Signed)
 Bleeds stable on subsequent head imaging.  - Neurochecks every 4 hours - Appreciate PT, OT, SLP recs - Palliative consulted, appreciate recs

## 2024-02-29 NOTE — Progress Notes (Signed)
 Walked by pt's room and saw his legs were out of the bed. Upon arrival into the room, the pt was satting in the high 60's on [place], HR in the 120's, with his body sideways in the bed. Pt was cyanotic and agitated. A nonrebreather was placed on the patient and he was readjusted by myself and the NT, Delon. Upon repositioning and the nonrebreather, the pt's sats went to 100%. Pt calmed down and HR lowered to 80's. The green humidified [place] was placed on the patient at 8L, and his sats stabilized. Rounding team aware; will keep a close eye on him and his respiratory status.

## 2024-02-29 NOTE — Plan of Care (Signed)
 FMTS Interim Progress Note  S: Night rounding on patient. Family present at bedside, brother, sister, mother. Patient reports he is doing okay. No concerns about his breathing. Reportedly was awake most of the previous night. Answered all questions and updated family.   O: BP (!) 146/83 (BP Location: Right Arm)   Pulse 86   Temp 99.4 F (37.4 C) (Axillary)   Resp 19   Ht 5' 9 (1.753 m)   Wt 93.6 kg   SpO2 98%   BMI 30.47 kg/m   General: NAD, sitting up in hospital bed Neuro: A&O, lethargic Cardiovascular: RRR, no murmurs  Respiratory: normal WOB on 6L [place], mild referred upper airway noises, some rhonchi left lung base Abdomen: soft, NTTP, no rebound or guarding, abdominal strap placed Extremities: Safety mittens on, Moving all 4 extremities equally, no peripheral edema   A/P: Aspiration pneumonia Respiratory status stable on 6L. Continue Unasyn  per day team.  SAH/SDH, Delirium, Frontotemporal dementia -Continue mittens, abdominal strap -Neurochecks q4h -Delirium precautions     Alba Sharper, MD 02/29/2024, 7:55 PM PGY-3, [facility] Family Medicine Service pager [phone]

## 2024-02-29 NOTE — Plan of Care (Signed)
 Went to evaluate patient at bedside with Dr. Lupie and Dr. Idelle.  [name] is a 63 year old gentleman, who is a continuity patient of mine.  I have known him for nearly 3 years now, and had the privelege to care for him during this time.  Family at bedside. Mother [name], brother [name].  They are all very involved in his care, mother and brother have power of attorney with sister-in-law as backup.  A brief history below: I have spoken to his family in the past, given the fact that [name] has been slowly worsening over the last 8-12 months.  He was living independently up until a fall he suffered in September 2024.  He was found to have unstable C5 cervical fracture, and underwent surgery.  He was discharged to [facility] for SNF, and ultimately switched to assisted living facility.  Prior to his fall, we were working up his tremors-and after neurology evaluation, he does not have Parkinson's and would believe this is likely related to his medication use (chronic antipsychotics for schizoaffective disorder, depression type).  He had been doing better after that hospitalization, following with psychiatry and me.  We had worked on his diabetes regimen, ultimately switching back to Ozempic  approximately January 2025.  He was also having dysphagia, which I sent to the GI for-EGD showed Barrett's esophagus-recommendation was omeprazole  40 mg daily, with repeat EGD in 3 years for surveillance.  Additionally had a colonoscopy, with hyperplastic polyps/tubular adenomas and recommendation was repeat colonoscopy in 1 year.  During the time between January and June 2025, [name] was always making his appointments on time and doing quite well.  He always appeared well-groomed, well-nourished and conversed appropriately.  Later in July 2025 he presents to the emergency room with altered mental status and was admitted to our service.  MRI was concerning for frontotemporal lobe degeneration.   When he followed up with me after this admission, his cognition is noted to be declined from prior.  He was oriented to himself and time at that point.  From that point on, [name] continued to suffer from multiple falls often ending up in the ED.  As patient was continuing to decline, we tried to reduce fall risk through use of DME such as: Walker, wheelchair, hospital bed (although he never received a hospital bed).  I had increasing concern for his ability to care for himself in assisted living, given his recurrent falls and no chaperone from his facility present to speak with during our office visit.  We performed additional workup to make sure he was not being overtreated for his hypothyroidism.  He was noted to have a pretty significant weight loss July 2025, at which time Ozempic  was tapered.  Patient was due to follow-up with me this month actually, but unfortunately was hospitalized and found to have subarachnoid/subdural hematoma.  He had been taking Eliquis  for his atrial fibrillation, and we have since decided to discontinue this medication based on discussion with family.  Unfortunately during this admission he has had a cardiac arrest, dysphagia requiring full assist feeds and pured food, continued confusion.  GOC conversation - Family unsure if they want to keep [name] - Agree with placement for SNF, agreeable to transition to long-term skilled nursing facility if necessary after acute rehab - They confirmed they would not want him to be on life support - They do not believe that he would want to have a feeding tube - I discussed having palliative care involvement,  they are agreeable. Ultimately, his prognosis is guarded given his rapid decline.  I think we need to give him time with PT/OT/SLP and time to heal from his extubation event to know the extent of function he will regain.  I am very appreciative to all of the medical providers and staff who have cared for [name] during  his multiple/current hospitalizations.  [name], DO  This was dictated using Dragon.  It was read for errors, however errors may still be present in dictation.

## 2024-02-29 NOTE — Progress Notes (Signed)
 Daily Progress Note Intern Pager: [phone]  Patient name: [name] Medical record number: [id number] Date of birth: Nov 18, 1960 Age: 63 y.o. Gender: male  Primary Care Provider: Howell Lunger, DO Consultants: neurology, pulmonology Code Status: Full  Pt Overview and Major Events to Date:  08/21: admitted with traumatic SAH and SDH 08/24: cardiac arrest, ROSC, intubation, ICU, extubation 08/26: transfer back to FMTS on Neuroprogressive  Assessment and Plan: Wadie L. Palmer is a 63yo M initially admitted to [facility] on 02/24/24 with traumatic SAH & SDH s/p reversal with Kcentra  w/o surgical intervention as per NSGY. His hospital course has been complicated by cardiac arrest on 08/24 with ROSC after 1.5 minutes of chest compressions and transfer to ICU. He has been transferred back to us  on the Neuroprogressive floor as of 08/26. Assessment & Plan Subdural hemorrhage (HCC) Subarachnoid hemorrhage (HCC) Bleeds stable on subsequent head imaging.  - Neurochecks every 4 hours - Appreciate PT, OT, SLP recs - Palliative consulted, appreciate recs Aspiration pneumonia (HCC) LLL, s/p extubation 08/24 -Continue Unasyn  3g q6h x5 days (08/25-08/29) Day 2/5  Persistent atrial fibrillation (HCC) On Eliquis  prior to admission but will discontinue during admission.  - Continuous cardiac telemetry - SCDs for VTE ppx History of seizures No evidence of returning seizure activity on 08/25 EEG.  - Continue IV Depacon  500mg  BID (250mg  q6h IV) Multiple falls Suspect 2/2 deconditioning, progressing dementia  -PT/OT recommending SNF -Fall precautions Difficulty in urination Most likely 2/2 BPH. Was unable to take Flomax  initially because he did not pass bedside swallow on admission. 16 Fr coude cath inserted 8/22 with good output - Trial cath removal when appropriate - Consider flomax  trial  Chronic health problem Hypothyroidism-continue Synthroid  Schizophrenia-continue  benztropine  0.5mg  BID, perphenazine  24mg  daily, phenelzine  45mg  BID History of seizures-continue Depakote  sprinkle 250mg  q8h Hyperlipidemia-continue atorvastatin  40mg  DM- CBGs well controlled here, stopping outpatient ozempic  Gout-continue allopurinol  150mg  daily  FEN/GI: soft diet  PPx: SCDs Dispo:SNF pending clinical improvement .   Subjective:  Patient was seen and examined at bedside. Alert to himself, can follow simple commands.   Objective: Temp:  [97.7 F (36.5 C)-98.6 F (37 C)] 97.7 F (36.5 C) (08/26 0309) Pulse Rate:  [56-99] 95 (08/26 0309) Resp:  [14-22] 20 (08/26 0006) BP: (85-128)/(30-76) 109/73 (08/26 0309) SpO2:  [92 %-100 %] 98 % (08/26 0309) Weight:  [93.6 kg] 93.6 kg (08/26 0323)  Physical Exam: General: chronically ill appearing, NAD, sitting up in bed Cardiovascular: irregularly irregular Respiratory: 6L [place], belly breathing,  Abdomen: soft, non-tender, restraint binder  Extremities: no peripheral edema Neuro: alert and oriented to place only, no facial asymmetry, no focal neurological deficits  Laboratory: Most recent CBC Lab Results  Component Value Date   WBC 7.5 02/29/2024   HGB 9.9 (L) 02/29/2024   HCT 29.5 (L) 02/29/2024   MCV 88.6 02/29/2024   PLT 102 (L) 02/29/2024   Most recent BMP    Latest Ref Rng & Units 02/29/2024    4:41 AM  BMP  Glucose 70 - 99 mg/dL 83   BUN 8 - 23 mg/dL 8   Creatinine 9.38 - 8.75 mg/dL 9.41   Sodium 864 - 854 mmol/L 131   Potassium 3.5 - 5.1 mmol/L 4.0   Chloride 98 - 111 mmol/L 99   CO2 22 - 32 mmol/L 24   Calcium  8.9 - 10.3 mg/dL 8.2    Lupie Credit, DO 02/29/2024, 7:06 AM  PGY-1, [facility] Family Medicine FPTS Intern pager: [phone],  text pages welcome Secure chat group [facility] Teaching Service

## 2024-02-29 NOTE — Progress Notes (Signed)
 Attempted to do oral care but patient was combative towards nurse, trying to hit nurse with his arms and will not open mouth.

## 2024-02-29 NOTE — Assessment & Plan Note (Signed)
 Suspect 2/2 deconditioning, progressing dementia  -PT/OT recommending SNF -Fall precautions

## 2024-02-29 NOTE — Assessment & Plan Note (Addendum)
 Bleeds stable on subsequent head imaging.  - Neurochecks every 4 hours - Appreciate PT, OT, SLP recs - Palliative consulted, appreciate recs

## 2024-02-29 NOTE — Progress Notes (Signed)
 NEUROLOGY CONSULT FOLLOW UP NOTE   Date of service: February 29, 2024 Patient Name: [name] MRN:  [id number] DOB:  [birth year]  Interval Hx/subjective   Patient is sitting up in bed. RN at bedside.   Vitals   Vitals:   02/29/24 0006 02/29/24 0309 02/29/24 0323 02/29/24 0729  BP: 124/69 109/73  (!) 125/105  Pulse: 89 95  85  Resp: 20   18  Temp: 98 F (36.7 C) 97.7 F (36.5 C)  98.3 F (36.8 C)  TempSrc: Oral     SpO2: 94% 98%  99%  Weight:   93.6 kg   Height:         Body mass index is 30.47 kg/m.  Physical Exam   Constitutional: Appears well-developed and well-nourished.  Cardiovascular: Normal rate and regular rhythm on monitor.   Respiratory:Irregular breathing, labored. Supplemental oxygenation via [place]  Neurologic Examination   Neuro: Mental Status: Patient continues to be lethargic, awakens to name but requires repeated stimulation to stay interactive, follows simple commands only. Poor attention and concentration.  No signs of neglect. Oriented to name, place.  Cranial Nerves: II: Blinks to threat bilaterally. Pupils are equal, round, and reactive to light.   III,IV, VI: EOMI without ptosis or diploplia.  V: Facial sensation is symmetric to light touch. VII: Facial movement is symmetric.  VIII: hearing is intact to voice X:  dysarthria and hoarseness present XI: Shoulder shrug is symmetric. XII: tongue is midline without atrophy or fasciculations.  Motor: Tone is normal. Bulk is normal.  Generalized weakness present, RLE seems to be slightly weaker than LLE but no drifts present.  Sensory: Sensation is symmetric to light touch in the arms and legs. Cerebellar: FNF intact bilaterally, weak and slow attributed to drowsiness.   Medications  Current Facility-Administered Medications:    acetaminophen  (TYLENOL ) tablet 650 mg, 650 mg, Oral, Q6H PRN **OR** acetaminophen  (TYLENOL ) suppository 650 mg, 650 mg, Rectal, Q6H PRN, Alena Morrison, Reagan,  MD   allopurinol  (ZYLOPRIM ) tablet 150 mg, 150 mg, Oral, Daily, Salam, Savannah B, RPH   Ampicillin -Sulbactam (UNASYN ) 3 g in sodium chloride  0.9 % 100 mL IVPB, 3 g, Intravenous, Q6H, Bowser, Ronnald BRAVO, NP, Stopped at 02/29/24 [phone]   atorvastatin  (LIPITOR) tablet 40 mg, 40 mg, Oral, QHS, Salam, Savannah B, RPH, 40 mg at 02/28/24 2103   benztropine  (COGENTIN ) tablet 0.5 mg, 0.5 mg, Oral, BID, Salam, Savannah B, RPH, 0.5 mg at 02/28/24 2101   Chlorhexidine  Gluconate Cloth 2 % PADS 6 each, 6 each, Topical, Daily, Everhart, Kirstie, DO, 6 each at 02/28/24 1119   docusate sodium  (COLACE) capsule 100 mg, 100 mg, Oral, Daily PRN, Bowser, Grace E, NP   doxazosin  (CARDURA ) tablet 1 mg, 1 mg, Oral, QHS, Salam, Savannah B, RPH, 1 mg at 02/28/24 2101   famotidine  (PEPCID ) IVPB 20 mg premix, 20 mg, Intravenous, Q12H, Bowser, Grace E, NP, Stopped at 02/28/24 2138   guaiFENesin  (MUCINEX ) 12 hr tablet 600 mg, 600 mg, Oral, BID PRN, Bowser, Grace E, NP   haloperidol  lactate (HALDOL ) injection 2-4 mg, 2-4 mg, Intravenous, Q6H PRN, Bowser, Grace E, NP, 4 mg at 02/28/24 1953   levothyroxine  (SYNTHROID ) tablet 125 mcg, 125 mcg, Oral, Q0600, Salam, Savannah B, RPH, 125 mcg at 02/29/24 0546   Oral care mouth rinse, 15 mL, Mouth Rinse, 4 times per day, McDiarmid, Krystal BIRCH, MD, 15 mL at 02/28/24 2149   Oral care mouth rinse, 15 mL, Mouth Rinse, PRN, McDiarmid, Krystal BIRCH, MD   perphenazine  (  TRILAFON ) tablet 24 mg, 24 mg, Oral, QHS, Salam, Savannah B, RPH, 24 mg at 02/28/24 2101   phenelzine  (NARDIL ) tablet 45 mg, 45 mg, Oral, BID, Salam, Savannah B, RPH, 45 mg at 02/28/24 2101   polyethylene glycol (MIRALAX  / GLYCOLAX ) packet 17 g, 17 g, Oral, Daily, Salam, Savannah B, RPH   valproate (DEPACON ) 250 mg in dextrose  5 % 50 mL IVPB, 250 mg, Intravenous, Q6H, Michaela Aisha SQUIBB, MD, Stopped at 02/29/24 9357  Labs and Diagnostic Imaging   CBC:  Recent Labs  Lab 02/28/24 0910 02/29/24 0441  WBC 9.3 7.5  HGB 10.1* 9.9*  HCT  30.6* 29.5*  MCV 89.7 88.6  PLT 120* 102*    Basic Metabolic Panel:  Lab Results  Component Value Date   NA 131 (L) 02/29/2024   K 4.0 02/29/2024   CO2 24 02/29/2024   GLUCOSE 83 02/29/2024   BUN 8 02/29/2024   CREATININE 0.58 (L) 02/29/2024   CALCIUM  8.2 (L) 02/29/2024   GFRNONAA >60 02/29/2024   GFRAA 103 11/02/2019   Lipid Panel:  Lab Results  Component Value Date   LDLCALC 64 09/08/2022   HgbA1c:  Lab Results  Component Value Date   HGBA1C 5.5 12/21/2023   Urine Drug Screen:     Component Value Date/Time   LABOPIA NONE DETECTED 11/19/2013 1014   COCAINSCRNUR NONE DETECTED 11/19/2013 1014   LABBENZ POSITIVE (A) 11/19/2013 1014   AMPHETMU NONE DETECTED 11/19/2013 1014   THCU NONE DETECTED 11/19/2013 1014   LABBARB NONE DETECTED 11/19/2013 1014    Alcohol  Level     Component Value Date/Time   ETH <15 02/24/2024 1638   INR  Lab Results  Component Value Date   INR 1.4 (H) 02/24/2024   APTT  Lab Results  Component Value Date   APTT 36 02/24/2024   AED levels: No results found for: PHENYTOIN, ZONISAMIDE, LAMOTRIGINE , LEVETIRACETA  02/24/2024 CT Head without contrast(Personally reviewed): Small-volume acute subarachnoid hemorrhage along the medial right frontal lobe. Acute subdural hemorrhage along the falx (measuring up to 4 mm in thickness).  02/25/2024 CT Head (Personally reviewed): No change or worsening since yesterday. Small volume subarachnoid hemorrhage medial to the upper right hemisphere. Small volume subdural hematoma along the falx, maximal thickness 3.4 mm. No mass effect or hydrocephalus.  02/27/2024 CT Head (Personally reviewed): Small volume scattered SAH, para falcine and tentorial SDH, is stable to slightly decreased from 2 days ago.  02/25/2024 MRI Brain(Personally reviewed): Small volume of scattered SAH, para falcine and right tentorial SDH not significantly changed from recent head CTs and compatible with sequelae of  trauma. No other acute intracranial abnormality identified.  LTM EEG  02/27/2024 1243 to 02/28/2024 0800:  This study is suggestive of moderate to severe diffuse encephalopathy. No seizures or definite epileptiform discharges were seen throughout the recording. Event button was pressed on 02/27/2024 at 1255 and 1258 for arm shaking without concomitant EEG change. These events were most likely NOT epileptic.   Assessment   [name] is a 63 y.o. male hx of FTD, afib (on Eliquis ), epilepsy (semiology staring spells, on VPA), schizophrenia who initially presented [facility] on [facility]24 with traumatic SAH & SDH (s/p reversal with Kcentra , no surgical intervention as per NSGY).   8/24 overnight, patient had respiratory arrest, bradycardic to 30's, with about 1.5 minute of chest compressions. With history of seizures and new SAH/SDH, he is at risk for further seizures and was been placed on LTM EEG, which was negative for  electrographic seizures.  Patient's home dose of Depakote  (250mg  TID) was increased to 500mg  BID or 250mg  Q6H IV. VPA level is therapeutic with results 8/26 of 89. With therapeutic levels, history of seizures and further increased risk due to [facility], recommend continuing the increased dose. Patient will need to follow-up with his outpatient neurologist for further AED management.   Repeat CTH shows stable to slightly decreased SAH/SDH. We are now 4 days out from initial head CT showing scattered SAH and tentorial SDH. Patient has now had multiple falls and multiple traumatic bleeds. In the setting of Afib with these recent traumatic SAH/SDH and frequent falls, risks of bleeding likely outweighs benefits of OAC. We will start ASA given need for some type of stroke risk factor modification. Patient's CHADS-VASc score = 1 for age. Patient should follow-up with his outpatient neurologist for further anti-thrombotic recommendations and management.   Recommendations   - continue seizure  precautions - continue increased dose of Depakote   While IV, 250mg  Q6H. Once able to take PO, will need to convert to DR 500mg  BID.  - start ASA 81mg  daily - close follow-up with Dr. Margaret, established outpatient neurologist for continued AED management and further consideration of risks/benefits or anticoagulation vs antiplatelets for a fib.   Neurology will sign off. Please recall with any further needs or questions.   ______________________________________________________________________   Anthony Rocky JAYSON Judithe, NP [facility]    Attending Neurohospitalist Addendum Patient seen and examined with APP/Resident. Agree with the history and physical as documented above. Agree with the plan as documented, which I helped formulate. I have edited the note above to reflect my full findings and recommendations. I have independently reviewed the chart, obtained history, review of systems and examined the patient.I have personally reviewed pertinent head/neck/spine imaging (CT/MRI). Please feel free to call with any questions.   -- Elida Ross, MD [facility] [phone]  If 7pm- 7am, please page neurology on call as listed in AMION.

## 2024-02-29 NOTE — Assessment & Plan Note (Addendum)
 No evidence of returning seizure activity on 08/25 EEG.  - Continue IV Depacon  500mg  BID (250mg  q6h IV)

## 2024-02-29 NOTE — Assessment & Plan Note (Addendum)
 On Eliquis  prior to admission but will discontinue during admission.  - Continuous cardiac telemetry - SCDs for VTE ppx

## 2024-02-29 NOTE — Assessment & Plan Note (Signed)
 Hypothyroidism-continue Synthroid  125mcg Schizophrenia-continue benztropine  0.5mg  BID, perphenazine  24mg  daily, phenelzine  45mg  BID History of seizures-continue Depakote  sprinkle 250mg  q8h Hyperlipidemia-continue atorvastatin  40mg  DM- CBGs well controlled here, stopping outpatient ozempic  Gout-continue allopurinol  150mg  daily

## 2024-02-29 NOTE — Assessment & Plan Note (Addendum)
 LLL, s/p extubation 08/24 -Continue Unasyn  3g q6h x5 days (08/25-08/29) Day 2/5

## 2024-02-29 NOTE — Plan of Care (Signed)
  Problem: Clinical Measurements: Goal: Diagnostic test results will improve Outcome: Not Progressing   Problem: Clinical Measurements: Goal: Respiratory complications will improve Outcome: Not Progressing   Problem: Clinical Measurements: Goal: Cardiovascular complication will be avoided Outcome: Not Progressing   Problem: Activity: Goal: Risk for activity intolerance will decrease Outcome: Not Progressing   Problem: Coping: Goal: Level of anxiety will decrease Outcome: Not Progressing   Problem: Safety: Goal: Ability to remain free from injury will improve Outcome: Not Progressing   Problem: Safety: Goal: Non-violent Restraint(s) Outcome: Not Progressing

## 2024-02-29 NOTE — Care Management Important Message (Signed)
 Important Message  Patient Details  Name: [name] MRN: [id number] Date of Birth: [birth year]   Important Message Given:  Yes - Medicare IM     Claretta Deed 02/29/2024, 3:58 PM

## 2024-03-01 ENCOUNTER — Inpatient Hospital Stay (HOSPITAL_COMMUNITY)

## 2024-03-01 DIAGNOSIS — J690 Pneumonitis due to inhalation of food and vomit: Secondary | ICD-10-CM | POA: Diagnosis not present

## 2024-03-01 DIAGNOSIS — Z7189 Other specified counseling: Secondary | ICD-10-CM

## 2024-03-01 DIAGNOSIS — W19XXXA Unspecified fall, initial encounter: Secondary | ICD-10-CM | POA: Diagnosis not present

## 2024-03-01 DIAGNOSIS — Z515 Encounter for palliative care: Secondary | ICD-10-CM

## 2024-03-01 DIAGNOSIS — I6200 Nontraumatic subdural hemorrhage, unspecified: Secondary | ICD-10-CM | POA: Diagnosis not present

## 2024-03-01 DIAGNOSIS — R4182 Altered mental status, unspecified: Secondary | ICD-10-CM

## 2024-03-01 DIAGNOSIS — R569 Unspecified convulsions: Secondary | ICD-10-CM | POA: Diagnosis not present

## 2024-03-01 DIAGNOSIS — I609 Nontraumatic subarachnoid hemorrhage, unspecified: Secondary | ICD-10-CM | POA: Diagnosis not present

## 2024-03-01 LAB — CBC
HCT: 30.2 % — ABNORMAL LOW (ref 39.0–52.0)
Hemoglobin: 10.1 g/dL — ABNORMAL LOW (ref 13.0–17.0)
MCH: 29.6 pg (ref 26.0–34.0)
MCHC: 33.4 g/dL (ref 30.0–36.0)
MCV: 88.6 fL (ref 80.0–100.0)
Platelets: 103 K/uL — ABNORMAL LOW (ref 150–400)
RBC: 3.41 MIL/uL — ABNORMAL LOW (ref 4.22–5.81)
RDW: 16.6 % — ABNORMAL HIGH (ref 11.5–15.5)
WBC: 12.1 K/uL — ABNORMAL HIGH (ref 4.0–10.5)
nRBC: 0 % (ref 0.0–0.2)

## 2024-03-01 LAB — BASIC METABOLIC PANEL WITH GFR
Anion gap: 12 (ref 5–15)
BUN: 6 mg/dL — ABNORMAL LOW (ref 8–23)
CO2: 24 mmol/L (ref 22–32)
Calcium: 8.4 mg/dL — ABNORMAL LOW (ref 8.9–10.3)
Chloride: 94 mmol/L — ABNORMAL LOW (ref 98–111)
Creatinine, Ser: 0.73 mg/dL (ref 0.61–1.24)
GFR, Estimated: >60 mL/min (ref >60)
Glucose, Bld: 103 mg/dL — ABNORMAL HIGH (ref 70–99)
Potassium: 3.5 mmol/L (ref 3.5–5.1)
Sodium: 130 mmol/L — ABNORMAL LOW (ref 135–145)

## 2024-03-01 LAB — BLOOD GAS, VENOUS
Acid-Base Excess: 1.5 mmol/L (ref 0.0–2.0)
Bicarbonate: 25.9 mmol/L (ref 20.0–28.0)
O2 Saturation: 96.4 %
Patient temperature: 36.7
pCO2, Ven: 38 mmHg — ABNORMAL LOW (ref 44–60)
pH, Ven: 7.43 (ref 7.25–7.43)
pO2, Ven: 68 mmHg — ABNORMAL HIGH (ref 32–45)

## 2024-03-01 LAB — GLUCOSE, CAPILLARY: Glucose-Capillary: 89 mg/dL (ref 70–99)

## 2024-03-01 MED ORDER — PIPERACILLIN-TAZOBACTAM 3.375 G IVPB
3.3750 g | Freq: Three times a day (TID) | INTRAVENOUS | Status: DC
  Filled 2024-03-01: qty 50

## 2024-03-01 MED ORDER — MORPHINE BOLUS VIA INFUSION
1.0000 mg | INTRAVENOUS | Status: DC | PRN
  Administered 2024-03-01: 2 mg via INTRAVENOUS
  Administered 2024-03-01: 1 mg via INTRAVENOUS
  Administered 2024-03-01: 2 mg via INTRAVENOUS

## 2024-03-01 MED ORDER — SCOPOLAMINE 1 MG/3DAYS TD PT72
1.0000 | MEDICATED_PATCH | TRANSDERMAL | Status: DC
  Administered 2024-03-01: 1 mg via TRANSDERMAL
  Filled 2024-03-01: qty 1

## 2024-03-01 MED ORDER — GLYCOPYRROLATE 0.2 MG/ML IJ SOLN: 0.4000 mg | INTRAMUSCULAR | Status: DC | PRN

## 2024-03-01 MED ORDER — ORAL CARE MOUTH RINSE
15.0000 mL | OROMUCOSAL | Status: DC
  Administered 2024-03-01 (×2): 15 mL via OROMUCOSAL

## 2024-03-01 MED ORDER — BIOTENE DRY MOUTH MT LIQD: 15.0000 mL | OROMUCOSAL | Status: DC | PRN

## 2024-03-01 MED ORDER — POLYVINYL ALCOHOL 1.4 % OP SOLN: 1.0000 [drp] | Freq: Four times a day (QID) | OPHTHALMIC | Status: DC | PRN

## 2024-03-01 MED ORDER — GLYCOPYRROLATE 0.2 MG/ML IJ SOLN
0.4000 mg | INTRAMUSCULAR | Status: DC
  Administered 2024-03-01 (×3): 0.4 mg via INTRAVENOUS
  Filled 2024-03-01 (×3): qty 2

## 2024-03-01 MED ORDER — ONDANSETRON HCL 4 MG/2ML IJ SOLN: 4.0000 mg | Freq: Four times a day (QID) | INTRAMUSCULAR | Status: DC | PRN

## 2024-03-01 MED ORDER — ORAL CARE MOUTH RINSE
15.0000 mL | OROMUCOSAL | Status: DC | PRN
  Administered 2024-03-01: 15 mL via OROMUCOSAL

## 2024-03-01 MED ORDER — MORPHINE 100MG IN NS 100ML (1MG/ML) PREMIX INFUSION
2.0000 mg/h | INTRAVENOUS | Status: DC
  Administered 2024-03-01: 10 mg/h via INTRAVENOUS
  Administered 2024-03-01: 2 mg/h via INTRAVENOUS
  Filled 2024-03-01 (×2): qty 100

## 2024-03-01 MED ORDER — LORAZEPAM 2 MG/ML IJ SOLN
1.0000 mg | INTRAMUSCULAR | Status: DC | PRN
  Administered 2024-03-01 (×2): 1 mg via INTRAVENOUS
  Filled 2024-03-01 (×2): qty 1

## 2024-03-01 MED ORDER — IPRATROPIUM-ALBUTEROL 0.5-2.5 (3) MG/3ML IN SOLN
3.0000 mL | Freq: Once | RESPIRATORY_TRACT | Status: AC
  Administered 2024-03-01: 3 mL via RESPIRATORY_TRACT
  Filled 2024-03-01: qty 3

## 2024-03-01 MED ORDER — MORPHINE SULFATE (PF) 2 MG/ML IV SOLN
2.0000 mg | Freq: Once | INTRAVENOUS | Status: AC
  Administered 2024-03-01: 2 mg via INTRAVENOUS
  Filled 2024-03-01: qty 1

## 2024-03-02 DIAGNOSIS — Z66 Do not resuscitate: Secondary | ICD-10-CM | POA: Insufficient documentation

## 2024-03-02 DIAGNOSIS — Z515 Encounter for palliative care: Secondary | ICD-10-CM

## 2024-03-02 DIAGNOSIS — R627 Adult failure to thrive: Secondary | ICD-10-CM | POA: Diagnosis present

## 2024-03-02 DIAGNOSIS — G3109 Other frontotemporal dementia: Secondary | ICD-10-CM | POA: Diagnosis present

## 2024-03-02 NOTE — Discharge Summary (Signed)
DEATH SUMMARY   Patient Details  Name: [name] MRN: [id number] DOB: 1961/04/09  Admission/Discharge Information   Admit Date:  Mar 19, 2024  Date of Death: Date of Death: 2024/03/25  Time of Death: Time of Death: 2237-03-30  Length of Stay: 7  Referring Physician: Howell Lunger, DO   Reason(s) for Hospitalization  Subdural hemorrhage  Diagnoses  Preliminary cause of death: subdural hemorrhage Secondary Diagnoses (including complications and co-morbidities):  Principal Problem:   Subdural hemorrhage (HCC) Active Problems:   Persistent atrial fibrillation (HCC)   Multiple falls   Chronic health problem   Difficulty in urination   Subarachnoid hemorrhage (HCC)   Cardiac arrest, cause unspecified (HCC)   Aspiration pneumonia (HCC)   History of seizures   Acute hypoxic respiratory failure (HCC)   DNR (do not resuscitate)   Comfort measures only status   Frontotemporal dementia (HCC)   Adult failure to thrive   Brief Hospital Course (including significant findings, care, treatment, and services provided and events leading to death)  [name] is a 63 y.o. year old male who has progressive failure to thrive in adult living in the facility with progressive cognitive and hoarseness decline related to presumed frontotemporal dementia based on imaging findings with multiple recurrent falls who presents to the hospital after falling out of bed lying in bed with subdural hematomas.  Course complicated by cardiac arrest in the setting of hypoxemia related to aspiration.  He had varying levels of alertness throughout his stay.  Repeat imaging showed stability of head images.  No worsening subdural new findings.  It was felt that with new subdural hematoma injury added to underlying frontotemporal dementia that his new baseline would be poor than prior.  He had recurrent aspiration events.  Eventually leading to an episode of severe hypoxemia prompting rapid response Mar 25, 2024.  PCCM was  consulted.  Goals of care discussion were had via PCP resulted in DNR.  Further conversations with palliative care resulted in comfort measures.  Comfort measures were instituted.    Pertinent Labs and Studies  Significant Diagnostic Studies EEG adult Result Date: 03/25/24 Shelton Arlin KIDD, MD     25-Mar-2024  8:29 AM Patient Name: [name] MRN: [id number] Epilepsy Attending: Arlin KIDD Shelton Referring Physician/Provider: Alba Sharper, MD Date: 03/25/24 Duration: 23.36 mins Patient history: 63yo F with ams. EEG to evaluate for seizure Level of alertness: Awake AEDs during EEG study: VPA Technical aspects: This EEG study was done with scalp electrodes positioned according to the 10-20 International system of electrode placement. Electrical activity was reviewed with band pass filter of 1-70Hz , sensitivity of 7 uV/mm, display speed of 77mm/sec with a 60Hz  notched filter applied as appropriate. EEG data were recorded continuously and digitally stored.  Video monitoring was available and reviewed as appropriate. Description: Continuous generalized 3 to 6 Hz theta-delta slowing was noted. EEG also showed generalized and maximal occipital periodic discharges with triphasic morphology at 1 Hz, more prominent when awake/stimulated. Hyperventilation and photic stimulation were not performed.   ABNORMALITY - Periodic discharges with triphasic morphology, generalized and maximal occipital ( GPDs) - Continuous slow, generalized IMPRESSION: This study is suggestive of moderate diffuse encephalopathy likely related to toxic-metabolic etiology. No seizures or definite epileptiform discharges were seen throughout the recording. Priyanka KIDD Shelton   CT HEAD WO CONTRAST ( ) Result Date: 03-25-2024 CLINICAL DATA:  Initial evaluation for acute mental status change, unknown cause. EXAM: CT HEAD WITHOUT CONTRAST TECHNIQUE: Contiguous axial images were obtained from the base of the  skull through the vertex without  intravenous contrast. RADIATION DOSE REDUCTION: This exam was performed according to the departmental dose-optimization program which includes automated exposure control, adjustment of the mA and/or kV according to patient size and/or use of iterative reconstruction technique. COMPARISON:  CT from 02/27/2024 FINDINGS: Brain: Frontotemporal predominant cerebral atrophy with chronic microvascular ischemic disease. Previously identified small volume combined subdural and subarachnoid hemorrhage along the falx again seen, little interval change from prior probable trace subdural blood along the right tentorial leaflet, also similar. No significant mass effect. No other new acute intracranial hemorrhage. No acute large vessel territory infarct. No mass lesion or midline shift. No hydrocephalus or intraventricular hemorrhage. Vascular: No abnormal hyperdense vessel. Calcified atherosclerosis present at the skull base. Skull: Scalp soft tissues and calvarium demonstrate no new finding. Sinuses/Orbits: Globes and orbital soft tissues within normal limits. Scattered mucosal thickening noted about the right frontal and left sphenoid sinuses. Trace bilateral mastoid effusions, of doubtful significance. Other: None. IMPRESSION: 1. No significant interval change in small volume combined subdural and subarachnoid hemorrhage along the falx and right tentorium. No significant mass effect. 2. No other new acute intracranial abnormality. 3. Underlying atrophy with chronic small vessel ischemic disease. Electronically Signed   By: Morene Hoard M.D.   On: 02/09/2024 01:46   DG CHEST PORT 1 VIEW Result Date: 02/23/2024 CLINICAL DATA:  Hypoxia. EXAM: PORTABLE CHEST 1 VIEW COMPARISON:  February 29, 2024 FINDINGS: The heart size and mediastinal contours are within normal limits. Moderate severity infiltrate is seen within the mid right lung and right lung base. This is increased in severity when compared to the prior study. Mild  atelectatic changes are suspected within the left lung base. No pleural effusion or pneumothorax is identified. Postoperative changes are seen within the lower cervical spine, with multilevel degenerative changes noted throughout the thoracic spine. IMPRESSION: Moderate severity right mid lung and right basilar infiltrate, increased in severity when compared to the prior study. Electronically Signed   By: Suzen Dials M.D.   On: 02/09/2024 01:24   DG CHEST PORT 1 VIEW Result Date: 02/29/2024 CLINICAL DATA:  Shortness of breath. EXAM: PORTABLE CHEST 1 VIEW COMPARISON:  Radiograph and CT 02/27/2024 FINDINGS: Interval extubation. Increasing left lung base opacity and possible effusion. Mild right lung base atelectasis. No pulmonary edema. No pneumothorax. Stable heart size and mediastinal contours. IMPRESSION: 1. Increasing left lung base opacity and possible effusion. 2. Mild right lung base atelectasis. Electronically Signed   By: Andrea Gasman M.D.   On: 02/29/2024 16:05   Overnight EEG with video Result Date: 02/28/2024 Shelton Arlin KIDD, MD     02/28/2024 12:10 PM Patient Name: [name] MRN: [id number] Epilepsy Attending: Arlin KIDD Shelton Referring Physician/Provider: Michaela Aisha SQUIBB, MD Duration: 02/27/2024 1243 to 02/28/2024 0914 Patient history: 63 y.o. male who initially [facility] on [facility]/24[facility]24 with traumatic SAH & SDH (s/p reversal with Kcentra , no surgical intervention as per NSGY). Recently had respiratory arrest, bradycardic to 30's, with about 1.5 minute of chest compressions. EEG to evaluate for seizure Level of alertness: Awake, asleep AEDs during EEG study: VPA Technical aspects: This EEG study was done with scalp electrodes positioned according to the 10-20 International system of electrode placement. Electrical activity was reviewed with band pass filter of 1-70Hz , sensitivity of 7 uV/mm, display speed of 71mm/sec with a 60Hz  notched filter applied as appropriate.  EEG data were recorded continuously and digitally stored.  Video monitoring was available and reviewed as appropriate. Description:  EEG showed continuous generalized polymorphic 3 to 6 Hz theta-delta slowing, at times sharply contoured in left occipital region. Sleep was characterized by vertex waves, sleep spindles (12 to 14 Hz), maximal frontocentral region.  Event button was pressed on 02/27/2024 at 1255 and 1258 for arm shaking. Concomitant EEG before, during and after the event did not show any EEG changes suggest seizure. Hyperventilation and photic stimulation were not performed.   ABNORMALITY - Continuous slow, generalized IMPRESSION: This study is suggestive of moderate to severe diffuse encephalopathy. No seizures or definite epileptiform discharges were seen throughout the recording. Event button was pressed on 02/27/2024 at 1255 and 1258 for arm shaking without concomitant EEG change. These events were most likely NOT epileptic. Arlin MALVA Krebs   CT Angio Chest Pulmonary Embolism (PE) W or WO Contrast Result Date: 02/27/2024 CLINICAL DATA:  Pulmonary embolism (PE) suspected, high prob. EXAM: CT ANGIOGRAPHY CHEST WITH CONTRAST TECHNIQUE: Multidetector CT imaging of the chest was performed using the standard protocol during bolus administration of intravenous contrast. Multiplanar CT image reconstructions and MIPs were obtained to evaluate the vascular anatomy. RADIATION DOSE REDUCTION: This exam was performed according to the departmental dose-optimization program which includes automated exposure control, adjustment of the mA and/or kV according to patient size and/or use of iterative reconstruction technique. CONTRAST:  75mL OMNIPAQUE  IOHEXOL  350 MG/ML SOLN COMPARISON:  04/28/2014. FINDINGS: Cardiovascular: Satisfactory opacification of the pulmonary arteries to the segmental level. No evidence of pulmonary embolism. Normal heart size. No pericardial effusion. Foci of coronary artery calcification.  Thoracic aorta is normal in caliber with trace atherosclerotic calcification. Mediastinum/Nodes: Endotracheal tube in place. Enteric tube in place with tip terminating in the gastric body. No enlarged mediastinal, hilar, or axillary lymph nodes. Lungs/Pleura: Consolidative opacity at the posterior left lung base could reflect a combination of aspirate or infiltrate and/or atelectasis. Areas of mucous plugging at the left-greater-than-right lung bases. Linear scarring/subsegmental atelectasis along the right minor fissure. No pleural effusion or pneumothorax. Upper Abdomen: No acute abnormality. Enteric tube tip in gastric body. Partially visualized 4.2 cm simple cyst at the superior pole of the right kidney. Musculoskeletal: No acute osseous abnormality. ACDF at C4-C7. Diffuse idiopathic skeletal hyperostosis of the thoracic spine with bridging anterior osteophytes. Review of the MIP images confirms the above findings. IMPRESSION: 1. No evidence of pulmonary embolism. 2. Consolidative opacity at the posterior left lung base could reflect a combination of aspirate or infiltrate and/or atelectasis. Areas of mucous plugging at the left-greater-than-right lung bases. Electronically Signed   By: Harrietta Sherry M.D.   On: 02/27/2024 11:20   CT HEAD WO CONTRAST ( ) Result Date: 02/27/2024 CLINICAL DATA:  63 year old male with intracranial hemorrhage after fall on blood thinners. EXAM: CT HEAD WITHOUT CONTRAST TECHNIQUE: Contiguous axial images were obtained from the base of the skull through the vertex without intravenous contrast. RADIATION DOSE REDUCTION: This exam was performed according to the departmental dose-optimization program which includes automated exposure control, adjustment of the mA and/or kV according to patient size and/or use of iterative reconstruction technique. COMPARISON:  Brain MRI and head CT 02/25/2024 and earlier. FINDINGS: Brain: Small volume para falcine combined subdural and  subarachnoid blood (series 3 images 21 and 23) is stable to slightly diminished. Trace right tentorial subdural blood also suspected (coronal image 43) and stable. No IVH. No ventriculomegaly. No new intracranial hemorrhage identified. No midline shift or mass effect. Stable gray-white matter differentiation throughout the brain. No cortically based acute infarct identified. Vascular: No suspicious intracranial vascular hyperdensity.  Skull: Stable.  No fracture identified. Sinuses/Orbits: Visualized paranasal sinuses and mastoids are stable and well aerated. Other: Stable orbit and scalp soft tissues. IMPRESSION: 1. Small volume scattered SAH, para falcine and tentorial SDH, is stable to slightly decreased from 2 days ago. 2. No intracranial mass effect.  No new intracranial abnormality. Electronically Signed   By: VEAR Hurst M.D.   On: 02/27/2024 10:51   DG Abd 1 View Result Date: 02/27/2024 EXAM: 1 VIEW XRAY OF THE ABDOMEN 02/27/2024 07:16:33 AM COMPARISON: Earlier film of the same day. CLINICAL HISTORY: Encounter for feeding tube placement. FINDINGS: BOWEL: Small bowel nondilated. Moderate colonic fecal material without dilatation. SOFT TISSUES: Gastric tube into the partially decompressed stomach. BONES: Bridging osteophytes in the visualized lower thoracic and lumbar spine. No acute osseous abnormality. IMPRESSION: 1. Gastric tube in appropriate position. 2. No bowel obstruction. Electronically signed by: Katheleen Faes MD 02/27/2024 07:47 AM EDT RP Workstation: [id number]   DG Abd 1 View Result Date: 02/27/2024 CLINICAL DATA:  Gastric tube placement EXAM: ABDOMEN - 1 VIEW COMPARISON:  Concurrently obtained chest x-ray FINDINGS: Nonvisualization of the gastric tube. It was visualized on the chest x-ray and is in the mid to lower esophagus. External defibrillator pad projects over the upper abdomen. No large free air. Moderate volume of formed stool. IMPRESSION: 1. Nonvisualization of the gastric tube. The  gastric tube was visualized on the accompanying chest x-ray and is located at the junction of the middle and lower thirds of the esophagus. 2. Large colonic stool burden suggests constipation. Electronically Signed   By: Wilkie Lent M.D.   On: 02/27/2024 07:16   DG CHEST PORT 1 VIEW Result Date: 02/27/2024 CLINICAL DATA:  Hypoxia, seizures EXAM: PORTABLE CHEST 1 VIEW COMPARISON:  Prior chest x-ray 02/24/2024 FINDINGS: The patient has been intubated. The tip of the endotracheal tube is in good position 2.8 cm above the carina. A gastric tube has been placed, however the tip terminates in the lower esophagus. External defibrillator pad projects over the chest. Linear airspace opacities in the lungs favor atelectasis. IMPRESSION: 1. Interval intubation. The endotracheal tube is in good position 2.8 cm above the carina. 2. A gastric tube is been placed, the tip overlies the junction of the middle and lower thirds of the esophagus. Recommend advancing if the intent is to decompress the stomach. 3. Low lung volumes with bibasilar atelectasis. Electronically Signed   By: Wilkie Lent M.D.   On: 02/27/2024 07:15   ECHOCARDIOGRAM LIMITED Result Date: 02/26/2024    ECHOCARDIOGRAM LIMITED REPORT   Patient Name:   [name] Date of Exam: 02/26/2024 Medical Rec #:  [id number]       Height:       69.0 in Accession #:    [id number]      Weight:       192.5 lb Date of Birth:  February 14, 1961       BSA:          2.033 m Patient Age:    63 years        BP:           101/73 mmHg Patient Gender: M               HR:           74 bpm. Exam Location:  Inpatient Procedure: Limited Echo, Cardiac Doppler and Color Doppler (Both Spectral and            Color Flow Doppler were utilized during  procedure). Indications:    Syncope  History:        Patient has no prior history of Echocardiogram examinations.                 Risk Factors:Dyslipidemia and Diabetes.  Sonographer:    Philomena Daring Referring Phys: 60 TODD D MCDIARMID   Sonographer Comments: Technically challenging study due to limited acoustic windows, Technically difficult study due to poor echo windows, suboptimal apical window and suboptimal parasternal window. Image acquisition challenging due to uncooperative patient. IMPRESSIONS  1. Very limited study (27 images) - poor echo windows, patient sitting upright  2. Left ventricular ejection fraction, by estimation, is 60 to 65%. The left ventricle has grossly normal function.  3. Grossly normal systolic function.  4. Normal biatrial size  5. The inferior vena cava is normal in size with <50% respiratory variability, suggesting right atrial pressure of 8 mmHg.  6. No pericardial effusion FINDINGS  Left Ventricle: Left ventricular ejection fraction, by estimation, is 60 to 65%. The left ventricle has normal function. Right Ventricle: Grossly normal systolic function. Left Atrium: Left atrial size was normal in size. Right Atrium: Right atrial size was normal in size. Pericardium: There is no evidence of pericardial effusion. Venous: The inferior vena cava is normal in size with less than 50% respiratory variability, suggesting right atrial pressure of 8 mmHg. Additional Comments: Spectral Doppler performed. Color Doppler performed.  IVC IVC diam: 1.90 cm Vinie Maxcy MD Electronically signed by Vinie Maxcy MD Signature Date/Time: 02/26/2024/3:30:45 PM    Final    MR BRAIN WO CONTRAST Result Date: 02/26/2024 CLINICAL DATA:  63 year old male with acute intracranial hemorrhage after fall on blood thinners. EXAM: MRI HEAD WITHOUT CONTRAST TECHNIQUE: Multiplanar, multiecho pulse sequences of the brain and surrounding structures were obtained without intravenous contrast. COMPARISON:  Head CTs 02/25/2024 and earlier.  Brain MRI 12/22/2023. FINDINGS: Brain: Small volume para falcine subdural blood redemonstrated and stable from recent CTs series 7, image 28. Small foci of abnormal diffusion signal along the anterior medial superior  frontal gyri, also the right cingulate gyrus are likely related to the subarachnoid blood products (series 2, image 45 and series 2, image 37). Similarly, linear abnormal diffusion along the right tentorium (series 3, image 15) is likely due to trace hemorrhage. None of the DWI areas are highly suspicious for cerebral contusion or ischemia. Accidentally, no SWI or T2* imaging was obtained. No intraventricular blood or debris is evident. Advanced cerebral volume loss redemonstrated as described on the June MRI. No midline shift, mass effect, evidence of mass lesion, ventriculomegaly. Cervicomedullary junction and pituitary are within normal limits. Scattered and patchy bilateral cerebral white matter T2 and FLAIR hyperintensity in both hemispheres appears stable since June. Stable T2 heterogeneity in the bilateral deep gray nuclei. No new intra-axial signal abnormality is identified. Vascular: Major intracranial vascular flow voids are stable since June. Skull and upper cervical spine: Stable and negative. Sinuses/Orbits: Stable aside from mild new left sphenoid sinus mucosal thickening. Other: Mastoids remain well aerated.  Stable visible scalp and face. IMPRESSION: 1. Small volume of scattered SAH, para falcine and right tentorial SDH not significantly changed from recent head CTs and compatible with sequelae of trauma. 2. No other acute intracranial abnormality identified. Stable advanced cerebral volume loss and chronic signal changes in the brain as described on June MRI. Electronically Signed   By: VEAR Hurst M.D.   On: 02/26/2024 04:52   CT HEAD WO CONTRAST ( ) Result Date: 02/25/2024 CLINICAL  DATA:  Subdural hematoma.  Worsening speech exam today. EXAM: CT HEAD WITHOUT CONTRAST TECHNIQUE: Contiguous axial images were obtained from the base of the skull through the vertex without intravenous contrast. RADIATION DOSE REDUCTION: This exam was performed according to the departmental dose-optimization program  which includes automated exposure control, adjustment of the mA and/or kV according to patient size and/or use of iterative reconstruction technique. COMPARISON:  02/24/2024.  02/13/2024. FINDINGS: Brain: No significant change since yesterday. Brain atrophy. No sign of acute stroke. Chronic small-vessel ischemic changes affect the white matter. Small volume subarachnoid hemorrhage medial to the upper right hemisphere is not increased. Small volume subdural hematoma along the falx, maximal thickness 3.4 mm, is not increased. No evidence of parenchymal hemorrhage. No mass effect or hydrocephalus. Vascular: There is atherosclerotic calcification of the major vessels at the base of the brain. Skull: No skull fracture. Sinuses/Orbits: Clear/normal Other: None IMPRESSION: No change or worsening since yesterday. Small volume subarachnoid hemorrhage medial to the upper right hemisphere. Small volume subdural hematoma along the falx, maximal thickness 3.4 mm. No mass effect or hydrocephalus. Electronically Signed   By: Oneil Officer M.D.   On: 02/25/2024 10:25   CT Head Wo Contrast Addendum Date: 02/24/2024 ADDENDUM REPORT: 02/24/2024 17:35 ADDENDUM: These results were called by telephone on 02/24/2024 at 5:16 pm to provider [facility] , who verbally acknowledged these results. Electronically Signed   By: Rockey Childs D.O.   On: 02/24/2024 17:35   Result Date: 02/24/2024 CLINICAL DATA:  Provided history: Head trauma, coagulopathy. EXAM: CT HEAD WITHOUT CONTRAST TECHNIQUE: Contiguous axial images were obtained from the base of the skull through the vertex without intravenous contrast. RADIATION DOSE REDUCTION: This exam was performed according to the departmental dose-optimization program which includes automated exposure control, adjustment of the mA and/or kV according to patient size and/or use of iterative reconstruction technique. COMPARISON:  Head CT 02/13/2024. FINDINGS: Brain: Advanced cerebral atrophy.  Small-volume acute subarachnoid hemorrhage along the medial right frontal lobe. Acute subdural hemorrhage along the falx measuring up to 4 mm in thickness (for instance as seen on series 5, image 50). Mild patchy and ill-defined hypoattenuation within the cerebral white matter, nonspecific but most often secondary to chronic small vessel ischemia. No demarcated cortical infarct. No evidence of an intracranial mass. No midline shift or hydrocephalus. Vascular: No hyperdense vessel. Skull: No calvarial fracture or aggressive osseous lesion. Sinuses/Orbits: No mass or acute finding within the imaged orbits. Mild mucosal thickening within the left sphenoid sinus. Traumatic Brain Injury Risk Stratification Skull Fracture: No - Low/mBIG 1 Subdural Hematoma (SDH): Yes Subarachnoid Hemorrhage [facility]): Yes Epidural Hematoma (EDH): No - Low/mBIG 1 Cerebral contusion, intra-axial, intraparenchymal Hemorrhage (IPH): No Intraventricular Hemorrhage (IVH): No - Low/mBIG 1 Midline Shift > 1mm or Edema/effacement of sulci/vents: No - Low/mBIG 1 ---------------------------------------------------- Attempts are being made to reach the ordering provider at this time. IMPRESSION: 1. Small-volume acute subarachnoid hemorrhage along the medial right frontal lobe. 2. Acute subdural hemorrhage along the falx (measuring up to 4 mm in thickness). 3. Advanced cerebral atrophy. 4. Mild cerebral white matter disease, nonspecific but most often secondary to chronic small vessel ischemia. Electronically Signed: By: Rockey Childs D.O. On: 02/24/2024 17:06   CT Cervical Spine Wo Contrast Result Date: 02/24/2024 CLINICAL DATA:  Mechanical fall EXAM: CT CERVICAL SPINE WITHOUT CONTRAST TECHNIQUE: Multidetector CT imaging of the cervical spine was performed without intravenous contrast. Multiplanar CT image reconstructions were also generated. RADIATION DOSE REDUCTION: This exam was performed according to the  departmental dose-optimization program which  includes automated exposure control, adjustment of the mA and/or kV according to patient size and/or use of iterative reconstruction technique. COMPARISON:  CT cervical spine 02/13/2024, 01/22/2024 FINDINGS: Alignment: Stable straightening of the cervical spine. Facet alignment is normal. Skull base and vertebrae: No acute fracture. No primary bone lesion or focal pathologic process. Soft tissues and spinal canal: No prevertebral fluid or swelling. No visible canal hematoma. Disc levels: Anterior fusion hardware C4 through C7. Diffuse ankylosis of the cervical and thoracic spine secondary to anterior fusion and dish type changes. 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 Bulky osteophytes at C2-C3. Facet ankylosis at multiple levels. Multilevel foraminal narrowing, worst at C6-C7. Upper chest: Negative. Other: None IMPRESSION: 1. No CT evidence for acute osseous abnormality of the cervical spine. 2. Anterior fusion hardware C4 through C7. 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 Chronic degenerative changes and ossification of posterior longitudinal ligament as above. Electronically Signed   By: Luke Bun M.D.   On: 02/24/2024 17:24   DG Pelvis Portable Result Date: 02/24/2024 CLINICAL DATA:  Trauma. EXAM: PORTABLE PELVIS 1-2 VIEWS COMPARISON:  Radiograph dated 02/13/2024 FINDINGS: No acute fracture or dislocation. Moderate bilateral hip arthritic change. Similar appearance of osteophyte at L5-S1. The soft tissues are unremarkable. IMPRESSION: 1. No acute fracture or dislocation. 2. Moderate bilateral hip arthritic change. Electronically Signed   By: Vanetta Chou M.D.   On: 02/24/2024 16:55   DG Chest Port 1 View Result Date: 02/24/2024 CLINICAL DATA:  Trauma. EXAM: PORTABLE CHEST 1 VIEW COMPARISON:  Chest radiograph dated 02/13/2024. FINDINGS: No focal consolidation, pleural effusion pneumothorax. The  cardiac silhouette is within limits. No acute osseous pathology. Cervical spine fusion hardware. IMPRESSION: No active disease. Electronically Signed   By: Vanetta Chou M.D.   On: 02/24/2024 16:52   DG Femur 1 View Right Result Date: 02/24/2024 CLINICAL DATA:  Fall and right lower extremity pain. EXAM: RIGHT FEMUR 1 VIEW COMPARISON:  Radiograph dated 02/09/2022. FINDINGS: No acute fracture or dislocation. The bones are mildly osteopenic. Mild arthritic changes of the right hip. The soft tissues are unremarkable. IMPRESSION: 1. No acute fracture or dislocation. 2. Mild arthritic changes of the right hip. Electronically Signed   By: Vanetta Chou M.D.   On: 02/24/2024 16:52   NM BRAIN DATSCAN  TUMOR LOC INFLAM SPECT 1 DAY Result Date: 02/14/2024 CLINICAL DATA:  Drug induced parkinsonism EXAM: NUCLEAR MEDICINE BRAIN IMAGING WITH SPECT  (DaTscan  ) TECHNIQUE: SPECT images of the brain were obtained after intravenous injection of radiopharmaceutical. 4 hour post injection imaging. Appropriate positioning. 130 mg IO STAT given orally for thyroid  blockade. RADIOPHARMACEUTICALS:  4.9 millicuries I 123 Ioflupane COMPARISON:  None Available. FINDINGS: Symmetric intense uptake within LEFT and RIGHT striata. The heads of the caudate nuclei and the posterior striata (putamen) are normal shape. No evidence of loss of dopamine transport populations in the basal ganglia. IMPRESSION: Ioflupane scan within normal limits. No reduced radiotracer activity in basal ganglia to suggest Parkinson's syndrome pathology. Of note, DaTSCAN  is not diagnostic of Parkinsonian syndromes, which remains a clinical diagnosis. DaTscan  is an adjuvant test to aid in the clinical diagnosis of Parkinsonian syndromes. Electronically Signed   By: Jackquline Boxer M.D.   On: 02/14/2024 14:15   CT HEAD WO CONTRAST Result Date: 02/13/2024 CLINICAL DATA:  Head trauma, moderate-severe; Polytrauma, blunt. Fall EXAM: CT HEAD WITHOUT CONTRAST CT  CERVICAL SPINE WITHOUT CONTRAST TECHNIQUE:  Multidetector CT imaging of the head and cervical spine was performed following the standard protocol without intravenous contrast. Multiplanar CT image reconstructions of the cervical spine were also generated. RADIATION DOSE REDUCTION: This exam was performed according to the departmental dose-optimization program which includes automated exposure control, adjustment of the mA and/or kV according to patient size and/or use of iterative reconstruction technique. COMPARISON:  CT head and C-spine 01/22/2024, MRI head 12/22/2023 FINDINGS: CT HEAD FINDINGS Brain: Cerebral ventricle sizes are concordant with the degree of cerebral volume loss. Patchy and confluent areas of decreased attenuation are noted throughout the deep and periventricular white matter of the cerebral hemispheres bilaterally, compatible with chronic microvascular ischemic disease. No evidence of large-territorial acute infarction. No parenchymal hemorrhage. No mass lesion. No extra-axial collection. Couple of benign fat density lesions along the falx cerebri No mass effect or midline shift. No hydrocephalus. Basilar cisterns are patent. Vascular: No hyperdense vessel. Skull: No acute fracture or focal lesion. Sinuses/Orbits: Paranasal sinuses and mastoid air cells are clear. Bilateral lens replacement. Otherwise the orbits are unremarkable. Other: None. CT CERVICAL SPINE FINDINGS Alignment: Normal. Skull base and vertebrae: Multilevel moderate degenerative changes finding air posterolateral longitudinal ligament calcification from the C2-C4 levels. C4-C7 anterior cervical discectomy and fusion surgical hardware. No severe osseous foraminal or central canal stenosis. No acute fracture. No aggressive appearing focal osseous lesion or focal pathologic process. Soft tissues and spinal canal: No prevertebral fluid or swelling. No visible canal hematoma. Upper chest: Unremarkable. Other: None. IMPRESSION: 1. No  acute intracranial abnormality. 2. No acute displaced fracture or traumatic listhesis of the cervical spine. Electronically Signed   By: Morgane  Naveau M.D.   On: 02/13/2024 21:45   CT CERVICAL SPINE WO CONTRAST Result Date: 02/13/2024 CLINICAL DATA:  Head trauma, moderate-severe; Polytrauma, blunt. Fall EXAM: CT HEAD WITHOUT CONTRAST CT CERVICAL SPINE WITHOUT CONTRAST TECHNIQUE: Multidetector CT imaging of the head and cervical spine was performed following the standard protocol without intravenous contrast. Multiplanar CT image reconstructions of the cervical spine were also generated. RADIATION DOSE REDUCTION: This exam was performed according to the departmental dose-optimization program which includes automated exposure control, adjustment of the mA and/or kV according to patient size and/or use of iterative reconstruction technique. COMPARISON:  CT head and C-spine 01/22/2024, MRI head 12/22/2023 FINDINGS: CT HEAD FINDINGS Brain: Cerebral ventricle sizes are concordant with the degree of cerebral volume loss. Patchy and confluent areas of decreased attenuation are noted throughout the deep and periventricular white matter of the cerebral hemispheres bilaterally, compatible with chronic microvascular ischemic disease. No evidence of large-territorial acute infarction. No parenchymal hemorrhage. No mass lesion. No extra-axial collection. Couple of benign fat density lesions along the falx cerebri No mass effect or midline shift. No hydrocephalus. Basilar cisterns are patent. Vascular: No hyperdense vessel. Skull: No acute fracture or focal lesion. Sinuses/Orbits: Paranasal sinuses and mastoid air cells are clear. Bilateral lens replacement. Otherwise the orbits are unremarkable. Other: None. CT CERVICAL SPINE FINDINGS Alignment: Normal. Skull base and vertebrae: Multilevel moderate degenerative changes finding air posterolateral longitudinal ligament calcification from the C2-C4 levels. C4-C7 anterior  cervical discectomy and fusion surgical hardware. No severe osseous foraminal or central canal stenosis. No acute fracture. No aggressive appearing focal osseous lesion or focal pathologic process. Soft tissues and spinal canal: No prevertebral fluid or swelling. No visible canal hematoma. Upper chest: Unremarkable. Other: None. IMPRESSION: 1. No acute intracranial abnormality. 2. No acute displaced fracture or traumatic listhesis of the cervical spine. Electronically Signed   By: Morgane  Margarite M.D.   On: 02/13/2024 21:45   DG Pelvis Portable Result Date: 02/13/2024 CLINICAL DATA:  Trauma EXAM: PORTABLE PELVIS 1-2 VIEWS COMPARISON:  X-ray pelvis 01/22/2024, CT abdomen pelvis 03/24/2015, x-ray pelvis 08/13/2021, x-ray pelvis 01/22/2024 FINDINGS: There is no evidence of pelvic fracture or diastasis. No acute displaced fracture or dislocation of either hips. At least moderate right and mild left degenerative changes. No pelvic bone lesions are seen. Redemonstration of indeterminate density overlying the left sacral ala. IMPRESSION: Negative for acute traumatic injury. Electronically Signed   By: Morgane  Naveau M.D.   On: 02/13/2024 21:40   DG Chest Port 1 View Result Date: 02/13/2024 CLINICAL DATA:  Trauma EXAM: PORTABLE CHEST 1 VIEW COMPARISON:  Chest x-ray 01/22/2024 FINDINGS: The heart and mediastinal contours are unchanged. Atherosclerotic plaque. No focal consolidation. No pulmonary edema. No pleural effusion. No pneumothorax. No acute osseous abnormality.  Cervical spine surgical hardware. IMPRESSION: 1. No active disease. 2.  Aortic Atherosclerosis (ICD10-I70.0). Electronically Signed   By: Morgane  Naveau M.D.   On: 02/13/2024 21:38    Microbiology Recent Results (from the past 240 hours)  MRSA Next Gen by PCR, Nasal     Status: None   Collection Time: 02/27/24  6:22 AM   Specimen: Nasal Mucosa; Nasal Swab  Result Value Ref Range Status   MRSA by PCR Next Gen NOT DETECTED NOT DETECTED Final     Comment: (NOTE) The GeneXpert MRSA Assay (FDA approved for NASAL specimens only), is one component of a comprehensive MRSA colonization surveillance program. It is not intended to diagnose MRSA infection nor to guide or monitor treatment for MRSA infections. Test performance is not FDA approved in patients less than 106 years old. Performed at [facility], [address], [place], [place] [address]     Lab Basic Metabolic Panel: Recent Labs  Lab 02/25/24 [phone] 02/25/24 1451 02/26/24 9366 02/27/24 0353 02/27/24 0629 02/27/24 2013 02/29/24 0441 02/22/2024 0101  NA  --    < > 130* 130* 130* 131* 131* 130*  K  --    < > 3.5 3.7 3.3* 4.2 4.0 3.5  CL  --    < > 96* 98  --  97* 99 94*  CO2  --    < > 25 24  --  27 24 24   GLUCOSE  --    < > 102* 130*  --  108* 83 103*  BUN  --    < > 6* 8  --  7* 8 6*  CREATININE  --    < > 0.58* 0.53*  --  0.64 0.58* 0.73  CALCIUM   --    < > 8.7* 8.7*  --  8.7* 8.2* 8.4*  MG 1.5*  --  1.6* 1.7  --  1.8  --   --   PHOS  --   --   --   --   --  3.3  --   --    < > = values in this interval not displayed.   Liver Function Tests: Recent Labs  Lab 02/24/24 1638  AST 15  ALT 5  ALKPHOS 72  BILITOT 1.2  PROT 5.5*  ALBUMIN  3.1*   No results for input(s): LIPASE, AMYLASE in the last 168 hours. No results for input(s): AMMONIA in the last 168 hours. CBC: Recent Labs  Lab 02/24/24 1638 02/24/24 1655 02/25/24 0340 02/27/24 0629 02/28/24 0910 02/29/24 0441 02/22/2024 0101  WBC 6.2  --  6.1  --  9.3 7.5 12.1*  HGB 11.1*   < > 10.7* 10.2* 10.1* 9.9* 10.1*  HCT 33.7*   < > 32.0* 30.0* 30.6* 29.5* 30.2*  MCV 88.9  --  87.4  --  89.7 88.6 88.6  PLT 150  --  133*  --  120* 102* 103*   < > = values in this interval not displayed.   Cardiac Enzymes: No results for input(s): CKTOTAL, CKMB, CKMBINDEX, TROPONINI in the last 168 hours. Sepsis Labs: Recent Labs  Lab 02/24/24 1655 02/25/24 0340 02/27/24 0918 02/28/24 0910  02/29/24 0441 02/27/2024 0101  WBC  --  6.1  --  9.3 7.5 12.1*  LATICACIDVEN 0.9  --  1.5  --   --   --     Procedures/Operations  As per EMR   [name] [name] Mar 13, 2024, 8:03 AM

## 2024-03-06 NOTE — Progress Notes (Signed)
PT Cancellation Note  Patient Details Name: [name] MRN: [id number] DOB: Oct 05, 1960   Cancelled Treatment:    Reason Eval/Treat Not Completed: Medical issues which prohibited therapy (Rapid response called earlier with intended transfer to ICU. Acute PT to re-attempt when medically stable.)  [name], PT, DPT Secure Chat [facility]  Rehab Office [phone]  [name] 02/25/2024, 11:32 AM

## 2024-03-06 NOTE — Progress Notes (Signed)
NAME:  [name], MRN:  [id number], DOB:  1961-01-14, LOS: 6 ADMISSION DATE:  02/24/2024, CONSULTATION DATE:  02/16/2024 REFERRING MD:  anders, CHIEF COMPLAINT:  cardiac arrest    Brief HPI:  Karina Lenderman is a 63 y.o. M admitted 8/22 after a fall and subsequent traumatic subdural and subarachnoid, PMH significant for afib on eliquis , depression, hypothyroidism, seizures depakote , dementia, dysphagia, and barrett's esophagus.  The SAD/SDH were small and he was admitted to the medicine teaching program with plans to possibly admit to inpatient rehab. In the early morning hours of March 16, 2024 he had a cardiac arrest which was not preceded by any hemodynamic instability but had a sudden deterioration of his respiratory status.  He was coded for approximately one minute transported to the ICU where he was intubated and subsequently extubated later that day.  Failed SLP, required precedex  overnight 03/16/2024.  EEG 8/25 negative for seizures or definite epileptiform discharges.  Seen again by SLP 8/25, placed on dsyphagia 1 diet.  Transferred back to [facility] 8/26.   Pertinent  Medical History   has a past medical history of A-fib (HCC), Acute right-sided low back pain, Anxiety, Arrhythmia, Barrett esophagus, Chronic a-fib (HCC), Closed fracture of left distal radius, Contact dermatitis (04/16/2017), Depression, Depression, Disorientation (04/06/2023), Hypothyroidism, Low back pain, Morbid obesity, BMI unknown (HCC), Obesity, Osteoarthritis, Schizophrenia (HCC), Seizures (HCC), Seizures (HCC), Thyroid  disease, Varicose veins, and Vertigo (11/03/2019).  Significant Hospital Events: Including procedures, antibiotic start and stop dates in addition to other pertinent events   8/22 Fall, traumatic SAH SDH no recs from NSGY but hold eliquis . Admitted to [facility]  16-Mar-2024 coded on the floor, intubated, transfer to ICU. Extubated same day  8/25 dc precedex . SLP eval.   Interim History / Subjective:  PCCM called for recurrent  hypoxic respiratory failure in setting of recurrent aspiration events since overnight. CXR with worsening infiltrates on right mid and lower.  Was on 8L [place] with intermittent airway obstruction with desats into 70s, relieved with jaw thrust, now on NRB at 100%.  Staff unable to place NPA.  Repeat EEG pending, repeat CTH overnight without significant changes. Family changed to no CPR but considering intubation.   Objective    Blood pressure 129/88, pulse (!) 111, temperature 100.1 F (37.8 C), temperature source Oral, resp. rate (!) 21, height 5' 9 (1.753 m), weight 93.6 kg, SpO2 100%.        Intake/Output Summary (Last 24 hours) at 02/06/2024 1148 Last data filed at 02/22/2024 0416 Gross per 24 hour  Intake 545.19 ml  Output 1150 ml  Net -604.81 ml   Filed Weights   02/24/24 1633 02/24/24 1852 02/29/24 0323  Weight: 88 kg 87.3 kg 93.6 kg   General:  ill appearing older male with HOB up in bed, intermittently agitated/ moaning HEENT: MM pink/dry, difficult to assess pupils due to squinting, appear equal 5/r, no upper airway congestion Neuro: intermittently agitated with groaning, MAE- no obvious focal deficits, not f/c or opening eyes CV:  irir PULM: intermittent mild WOB/ abd breathing, diffusely coarse/ scattered rhonchi GI: obese, hypoBS, foley- amber urine Extremities: warm/dry, no pitting edema, wrist wrapped, in mittens Skin: no rashes   Labs> Na 130, K 3.5, BUN/ sCr 6/ 0.73, WBC 7.5> 12.1, H/H stable, plt stable 103  Resolved problem list  In hospital arrest -- approx 1 min  -03/16/2024, etiology unclear but suspected 2/2 to aspiration/ respiratory event.   Assessment and Plan   Encephalopathy superimposed on underlying schizophrenia, frontotemporal dementia, likely  fluctuating hypo/ hyperactive delirium, and traumatic SDH/SAH 2/2 fall  - repeat neuro imaging 8/24 w slightly decr size of SAH/SDH, no mass effect and no new abnormality  - neurology signed off 8/26 P - repeat CTH  stable overnight, EEG this am with no seizure activity, new GPD but likely 2/2 to underlying structural issues and metabolic encephalopathy  - cont AEDs IV form while pt is NPO> depakote   - currently NPO> holding ASA (given recurrent falls, too high risk to continue eliquis ), cogentin   - hopefully will not need precedex  again - seizure/ aspiration precautions - delirium precautions  - ongoing GOC's> unclear if family would want cortrak to resume oral meds.  PMT consulted  Acute hypoxic resp failure  Aspiration PNA  Hx barretts esophagus, dysphagia P - will tx to ICU for close airway monitoring, cont supplemental O2, not a candidate for BiPAP with mental status, HHFNC if increased WOB/ NTS.  High risk of further decompensation given encephalopathy - no hypercarbia on ABG overnight - now DNR/ DNI> see separate IPAL note - NTS prn - cont zosyn  - NPO - pulm hygiene - SLP following   Afib  - tele monitoring, rate controlled - optimize electrolytes - eliquis  stopped given traumatic SAH/ SDH.  Given decline, recurrent falls, too high risk to restart once current issues resolved.  ASA for stroke prevention> currently NPO  Hypothyroidism  - TSH 0.626 on 7/24 -  synthroid    DMT2 - prn SSI, has not needed coverage.  At risk for hypoglycemia while NPO  Urinary retention - foley placed this admission, cont cardura  when able to safely take POs  Functional decline - family reports progressive functional decline over last year, ongoing GOC  Best Practice (right click and Advertising copywriter Selections daily)   Diet/type: NPO DVT prophylaxis SCD Pressure ulcer(s): N/A GI prophylaxis: H2B Lines: N/A Foley:  Yes, and it is still needed Code Status:  DNR Last date of multidisciplinary goals of care discussion- ongoing 8/27  Mother, and other family at bedside  Labs   CBC: Recent Labs  Lab 02/24/24 1638 02/24/24 1655 02/25/24 0340 02/27/24 0629 02/28/24 0910 02/29/24 0441  03/03/2024 0101  WBC 6.2  --  6.1  --  9.3 7.5 12.1*  HGB 11.1*   < > 10.7* 10.2* 10.1* 9.9* 10.1*  HCT 33.7*   < > 32.0* 30.0* 30.6* 29.5* 30.2*  MCV 88.9  --  87.4  --  89.7 88.6 88.6  PLT 150  --  133*  --  120* 102* 103*   < > = values in this interval not displayed.    Basic Metabolic Panel: Recent Labs  Lab 02/25/24 0812 02/25/24 1451 02/26/24 9366 02/27/24 0353 02/27/24 0629 02/27/24 2013 02/29/24 0441 02/26/2024 0101  NA  --    < > 130* 130* 130* 131* 131* 130*  K  --    < > 3.5 3.7 3.3* 4.2 4.0 3.5  CL  --    < > 96* 98  --  97* 99 94*  CO2  --    < > 25 24  --  27 24 24   GLUCOSE  --    < > 102* 130*  --  108* 83 103*  BUN  --    < > 6* 8  --  7* 8 6*  CREATININE  --    < > 0.58* 0.53*  --  0.64 0.58* 0.73  CALCIUM   --    < > 8.7* 8.7*  --  8.7* 8.2* 8.4*  MG 1.5*  --  1.6* 1.7  --  1.8  --   --   PHOS  --   --   --   --   --  3.3  --   --    < > = values in this interval not displayed.   GFR: Estimated Creatinine Clearance: 104 mL/min (by C-G formula based on SCr of 0.73 mg/dL). Recent Labs  Lab 02/24/24 1655 02/25/24 0340 02/27/24 0918 02/28/24 0910 02/29/24 0441 02/21/2024 0101  WBC  --  6.1  --  9.3 7.5 12.1*  LATICACIDVEN 0.9  --  1.5  --   --   --     Liver Function Tests: Recent Labs  Lab 02/24/24 1638  AST 15  ALT 5  ALKPHOS 72  BILITOT 1.2  PROT 5.5*  ALBUMIN  3.1*   No results for input(s): LIPASE, AMYLASE in the last 168 hours. No results for input(s): AMMONIA in the last 168 hours.  ABG    Component Value Date/Time   PHART 7.445 02/27/2024 0629   PCO2ART 34.2 02/27/2024 0629   PO2ART 115 (H) 02/27/2024 0629   HCO3 25.9 03/05/2024 0059   TCO2 25 02/27/2024 0629   O2SAT 96.4 03/05/2024 0059     Coagulation Profile: Recent Labs  Lab 02/24/24 1638  INR 1.4*    Cardiac Enzymes: No results for input(s): CKTOTAL, CKMB, CKMBINDEX, TROPONINI in the last 168 hours.  HbA1C: HbA1c, POC (prediabetic range)  Date/Time  Value Ref Range Status  12/14/2022 10:31 AM 6.4 5.7 - 6.4 % Final   HbA1c, POC (controlled diabetic range)  Date/Time Value Ref Range Status  12/21/2023 11:47 AM 5.5 0.0 - 7.0 % Final  09/13/2023 01:51 PM 6.3 0.0 - 7.0 % Final    CBG: Recent Labs  Lab 02/29/24 0648 02/29/24 1134 02/29/24 1551 02/29/24 2109 02/19/2024 0610  GLUCAP 79 95 95 82 89    CCT: 45 mins    Lyle Pesa, MSN, AG-ACNP-BC [facility] 02/15/2024, 11:48 AM  See Amion for pager If no response to pager , please call [phone] until 7pm After 7:00 pm call Elink  [phone]

## 2024-03-06 NOTE — Assessment & Plan Note (Signed)
 Suspect 2/2 deconditioning, progressing dementia  -PT/OT recommending SNF -Fall precautions

## 2024-03-06 NOTE — Assessment & Plan Note (Signed)
 No evidence of returning seizure activity on 08/25 EEG.  - Continue IV Depacon  500mg  BID (250mg  q6h IV)

## 2024-03-06 NOTE — Significant Event (Addendum)
 Rapid Response Event Note   Reason for Call :  Hypoxia-60% on 8L HFNC([place]ter).  Pt was placed on NRB with SpO2 increasing to 95%  Initial Focused Assessment:  Pt lying in bed with eyes closed. His breathing is tachypneic/labored/congested. He is able to tell me his first and last name. He moves everything to painful stimuli but does not follow commands. Pupils 6, equal, reactive.  Lungs with rhonchi t/o. Skin warm/dry.  T-98, HR-92, BP-183/75, RR-32, SpO2-95% on NRB.   Interventions:  NRB NTS-Thick tan/pink secretions sxed out PCXR-Moderate severity right mid lung and right basilar infiltrate, increased in severity when compared to the prior study. VBG-7.43/38/68/25.9 CT head EEG Plan of Care:  Pt able to be weaned back to HFNC(salter). Await CT results. May continue to wean FiO2 if SpO2 allows. Please call RRT if further assistance needed.  Event Summary:   MD Notified: Drs Alba and Bhagat to beside to assess pt.   Call Time:0017 Arrival Time:0023 End Upfz:[id number]  Tish Graeme Piety, RN

## 2024-03-06 NOTE — Plan of Care (Signed)
 Called to Rapid. ~1030. Rapid nurse Joyceann present, Nurse Dena and another nurse present. Patient not protecting air way. AMS precludes BiPAP. Nursing unable to drop nasopharyngeal airway. Requiring jaw thrust to maintain O2.  Spoke with family at bedside. Mother Lonell, Brother Redell and [employer]. Family decides to switch to DNR-Intervene, with desire to intubate and transfer to ICU, but no CPR if heart stops.  Spoke with CCM, they will see patient. He will need intubation and transfer to ICU.  Notified nurse Joyceann and family.

## 2024-03-06 NOTE — Consult Note (Signed)
Consultation Note Date: 02/17/2024   Patient Name: [name]  DOB: 1961/04/30  MRN: [id number]  Age / Sex: 63 y.o., male  PCP: Howell Lunger, DO Referring Physician: Anders Otto DASEN, MD  Reason for Consultation: Establishing goals of care  HPI/Patient Profile: 63 y.o. male  with past medical history of Afib on Eliquis , Anxiety, Barrett esophagus, Depression, Hypothyroidism, Schizophrenia, Seizures, frontotemporal dementia admitted on 02/24/2024 with fall and head injury.   Patient was found to have SAH and SDH during evaluation to the ED and subsequently admitted to [facility]. He had cardiac arrest on 8/24 ROSC after 1.5 minutes of chest compressions and required intubation/ICU transfer.  Hospitalization further complicated by multiple episodes of aspiration and aspiration pneumonia causing acute hypoxic respiratory failure.  PMT has been consulted to assist with goals of care conversation.  Clinical Assessment and Goals of Care:  I have reviewed medical records including EPIC notes, labs and imaging, discussed with RN, assessed the patient and then met at the bedside with patient's mother, brother, sister-in-law to discuss diagnosis prognosis, GOC, EOL wishes, disposition and options.  I introduced Palliative Medicine as specialized medical care for people living with serious illness. It focuses on providing relief from the symptoms and stress of a serious illness. The goal is to improve quality of life for both the patient and the family.  We discussed a brief life review of the patient and then focused on their current illness.  The natural disease trajectory and expectations at EOL were discussed.  I attempted to elicit values and goals of care important to the patient.    Medical History Review and Understanding:  We discussed patient's acute illness in the context of their chronic comorbidities.  Patient's family understand the severity of patient's  illness.  Social History: Patient used to enjoy watching hockey, attending sporting events before he was too sick to attend in person.  He seems to have had some initial engagement at [facility] (has been a resident there since December).  Was baking cookies and engaged with others.  He has had difficulty with depression since he was 63 years old.  He is supported by his mother and siblings.  Functional and Nutritional State: Family report patient was independent until September 2024, but since then he has had progressive decline.  This has been a much faster decline in the past 2 to 3 months.  Palliative Symptoms: Dyspnea/air hunger, agitation, excessive secretions  Advance Directives: A detailed discussion regarding advanced directives was had.  Review documentation on file which names mother, brother, sister-in-law as TEFL teacher.   Code Status: Concepts specific to code status, artifical feeding and hydration, and rehospitalization were considered and discussed.  DNR/DNI confirmed.  Discussion: Emotional support and therapeutic listening was provided as patient shared the increasing difficulty patient has had with enjoying his life over the past couple of years and especially over the past couple of months.  They have not discussed his limitations or boundaries much in the past, though they feel he would never want his life to be sustained if he was in a coma.   They understand that treatment options are very limited and even in the best case scenario with some improvement from his respiratory failure, it is inevitable that he will have recurrent aspiration events and further decline/worsening quality of life given the progressive nature of his dementia and chronic comorbidities.   After discussing options, family made the decision to proceed with comfort focused care.  I was joined  by family medicine teaching service and RN during this conversation as well.   Treatment options for initiating comfort medications were discussed including morphine  drip and family was agreeable.  After administration of 2 mg morphine  bolus and 0.4 milligram Robinul , patient was more comfortable.  Provided family with PMT contact information.  Remained in contact with RN for ongoing concern for patient's respirations.  Added scopolamine  patch and considering atropine as needed if secretions remain difficult to control.   The difference between aggressive medical intervention and comfort care was considered in light of the patient's goals of care. Hospice services outpatient were explained and offered.   Discussed the importance of continued conversation with family and the medical providers regarding overall plan of care and treatment options, ensuring decisions are within the context of the patient's values and GOCs.   Questions and concerns were addressed.  Hard Choices booklet left for review. The family was encouraged to call with questions or concerns.  PMT will continue to support holistically.   SUMMARY OF RECOMMENDATIONS   -Continue DNR/DNI -Transition to comfort focused care today Morphine  drip with boluses PRN for pain/air hunger/comfort Robinul  scheduled and PRN for excessive secretions Scopolamine  patch for excessive secretions Ativan  PRN for agitation/anxiety Zofran  PRN for nausea Liquifilm tears PRN for dry eyes Haldol  PRN for agitation/anxiety May have comfort feeding Comfort cart for family Unrestricted visitations in the setting of EOL (per policy) Oxygen PRN 2L or less for comfort. No escalation.   -Psychosocial and emotional support provided -Patient's family seem to prefer avoiding transfer/hospital death rather than hospice facility -PMT will continue to follow and support   Prognosis:  Hours - Days  Discharge Planning: To Be Determined      Primary Diagnoses: Present on Admission:  Subdural hemorrhage (HCC)  Persistent atrial  fibrillation (HCC)  Difficulty in urination    Physical Exam Vitals and nursing note reviewed.  Constitutional:      General: He is in acute distress.     Appearance: He is ill-appearing.  Cardiovascular:     Rate and Rhythm: Tachycardia present.  Pulmonary:     Effort: Respiratory distress present.  Skin:    General: Skin is warm and dry.  Neurological:     Mental Status: He is disoriented and confused.  Psychiatric:        Behavior: Behavior is agitated.        Cognition and Memory: Cognition is impaired.     Vital Signs: BP 90/72 (BP Location: Right Leg)   Pulse 97   Temp 99.8 F (37.7 C) (Axillary)   Resp 20   Ht 5' 9 (1.753 m)   Wt 93.6 kg   SpO2 94%   BMI 30.47 kg/m  Pain Scale: Faces   Pain Score: 0-No pain   SpO2: SpO2: 94 % O2 Device:SpO2: 94 % O2 Flow Rate: .O2 Flow Rate (L/min): 8 L/min   Palliative Assessment/Data: 10%    MDM: high   Kristopher Delk SHAUNNA Fell, PA-C  Palliative Medicine Team Team phone # [phone]  Thank you for allowing the Palliative Medicine Team to assist in the care of this patient. Please utilize secure chat with additional questions, if there is no response within 30 minutes please call the above phone number.  Palliative Medicine Team providers are available by phone from 7am to 7pm daily and can be reached through the team cell phone.  Should this patient require assistance outside of these hours, please call the patient's attending physician.

## 2024-03-06 NOTE — Progress Notes (Signed)
   02/21/2024 0033  Assess: MEWS Score  Temp 98.2 F (36.8 C)  BP (!) 173/85  MAP (mmHg) 107  ECG Heart Rate (!) 107  Resp (!) 22  SpO2 96 %  O2 Device Non-rebreather Mask  Assess: MEWS Score  MEWS Temp 0  MEWS Systolic 0  MEWS Pulse 1  MEWS RR 1  MEWS LOC 0  MEWS Score 2  MEWS Score Color Yellow  Assess: if the MEWS score is Yellow or Red  Were vital signs accurate and taken at a resting state? Yes  Does the patient meet 2 or more of the SIRS criteria? Yes  Does the patient have a confirmed or suspected source of infection? Yes  MEWS guidelines implemented  Yes, yellow  Treat  MEWS Interventions Considered administering scheduled or prn medications/treatments as ordered  Take Vital Signs  Increase Vital Sign Frequency  Yellow: Q2hr x1, continue Q4hrs until patient remains green for 12hrs  Escalate  MEWS: Escalate Yellow: Discuss with charge nurse and consider notifying provider and/or RRT  Notify: Charge Nurse/RN  Name of Charge Nurse/RN Janese Sciara, RN  Provider Notification  Provider Name/Title Alba, MD  Date Provider Notified 02/11/2024  Time Provider Notified [phone]  Method of Notification Page  Notification Reason Change in status  Provider response At bedside  Date of Provider Response 02/07/2024  Time of Provider Response 0039  Notify: Rapid Response  Name of Rapid Response RN Notified Mindy, RN  Date Rapid Response Notified 02/18/2024  Time Rapid Response Notified 0039  Assess: SIRS CRITERIA  SIRS Temperature  0  SIRS Respirations  1  SIRS Pulse 1  SIRS WBC 0  SIRS Score Sum  2

## 2024-03-06 NOTE — Assessment & Plan Note (Signed)
 On Eliquis  prior to admission but will discontinue during admission.  - Continuous cardiac telemetry - SCDs for VTE ppx

## 2024-03-06 NOTE — Significant Event (Signed)
 Rapid Response Event Note   Reason for Call :  Respiratory Distress  Initial Focused Assessment:  Patient is in acute respiratory distress.  He has minimal airway movement.  He is obstructing his upper airway.  He is not oriented at all.  He becomes very agitated when he experiences the upper airway obstruction.   BP  169/60  HR 130-140s  RR 25-35  O2 sats 83% on NRB  recent temp 100.1   Interventions:  Jaw thrust/lift BP 103/75  HR 107  RR 19-25  O2 sat 100% on NRB Unable to pass Nasal trumpet  MD came to bedside,  Family came to bedside:  Goals of care conversation CCM consulted and at bedside Code Status changed to DNR limited  Transferred to ICU  Plan of Care:  Heated High Flow   Event Summary:   MD Notified: [name] came to bedside Call Time: 1021 Arrival Time: 1024 End Time:  1155  Elvin Portland, RN

## 2024-03-06 NOTE — Plan of Care (Signed)
  Problem: Clinical Measurements: Goal: Will remain free from infection Outcome: Not Progressing   Problem: Clinical Measurements: Goal: Diagnostic test results will improve Outcome: Not Progressing   Problem: Clinical Measurements: Goal: Respiratory complications will improve Outcome: Not Progressing   Problem: Clinical Measurements: Goal: Cardiovascular complication will be avoided Outcome: Not Progressing   Problem: Skin Integrity: Goal: Risk for impaired skin integrity will decrease Outcome: Not Progressing   Problem: Respiratory: Goal: Ability to maintain a clear airway and adequate ventilation will improve Outcome: Not Progressing   Problem: Safety: Goal: Non-violent Restraint(s) Outcome: Not Progressing

## 2024-03-06 NOTE — Assessment & Plan Note (Signed)
 Most likely 2/2 BPH. Was unable to take Flomax  initially because he did not pass bedside swallow on admission. 16 Fr coude cath inserted 8/22 with good output - Trial cath removal when appropriate - Consider flomax  trial

## 2024-03-06 NOTE — Assessment & Plan Note (Addendum)
 Bleeds stable on subsequent head imaging.  - Neurochecks every 4 hours - Appreciate PT, OT - SLP recheck today s/p aspiration episodes 08/26 and overnight  - NPO until new recs - Palliative consulted, appreciate recs

## 2024-03-06 NOTE — Progress Notes (Addendum)
 This RN received patient from 3W for increased concerns for intubation. Upon arrival, decision made to make patient full DNR, with no longer requesting intubation from primary decision makers (Mother, Lonell, and brother of patient, Redell).   Palliative care meeting being held by Josseline Cooper, PA-C, with family at bedside and this RN present.  As of 1245, the decision to make Mr. Sparlin comfort care was made. See new orders tab.  Avilyn Virtue, RN

## 2024-03-06 NOTE — Assessment & Plan Note (Addendum)
 LLL, s/p extubation 08/24 - Switch Unasyn  3g q6h (given 2 days) to Zosyn  3.375g q8h 08/27-? Depending on patient goals of care and worsening/improving aspiration PNA/possible intubation?

## 2024-03-06 NOTE — Progress Notes (Signed)
 Pt was struggling to breathe, O2 dropped to the 70's, and a nonrebreather was placed on the pt. O2 saturations still didn't rise tremendously, so a rapid was called. Pt was obstructing, so a jaw thrust maneuver was performed. MD and respiratory alerted as well and responded. Family arrived at bedside and is trying to figure out what the patients wishes are. For now, will tx to 4N ICU. Report called. Pt transferred with RT, Helle, and myself. Family waiting in ICU lounge.

## 2024-03-06 NOTE — Plan of Care (Addendum)
FMTS Interim Progress Note  S:Messaged by RN for rapid called for patient. Patient reported had increased arm movement that RN noticed and was slouched sideways in bed. Patient was not responsive to verbal stimulation, and reportedly had some increased rigidity in lower extremities. Arm movement described by RN as more agitated than rhythmic movements. No abnormal eye movements. Patient reported desaturated down to 60s during this episode. Arm movement only lasted briefly; however, patient did not return to normal mental baseline from earlier this evening. Saturations improved with placement of non-rebreather.  O: BP (!) 173/85 (BP Location: Right Leg)   Pulse 100   Temp 98.2 F (36.8 C) (Axillary)   Resp (!) 22   Ht 5' 9 (1.753 m)   Wt 93.6 kg   SpO2 96%   BMI 30.47 kg/m   General: ill appearing male, on non rebreather Neuro: opens eyes and responds to verbal stimuli, slurred speech difficult to interpret, not following commands, pupils equal round and reactive to light, EOMI grossly intact, no nystagmus Nose: after NTS, bloody nares bilaterally, significant bloody mucous drainage with suction Cardiovascular: RRR, no murmurs Respiratory: mildly increased WOB on non-rebreather HFNC, audible gurgling noises with each breath Abdomen: soft, NTTP, no rebound or guarding Extremities: Moving all 4 extremities equally, no peripheral edema   A/P: Hypoxia Altered Mental Status Suspect that patient had repeat aspiration event. Now multiple this admission prior causing cardiac arrest and transfer to ICU. Reassuringly respiratory status improved with suction and non-rebreather. Description not consistent with seizure but given cause of admission SAH and SDH will evaluate for worsening brain bleed. Favor AMS is secondary to worsening delirium, without obvious focal deficits, or other gross changes in neurologic exam. - Continue non rebreather, HFNC - Keep upright while in bed - Continue abdominal  strap - Trial duoneb - STAT CT Head - STAT CXR - Continue neuro checks - EEG given seizure history - VBG  Alba Sharper, MD 02/29/2024, 12:59 AM PGY-3, [facility] Health Family Medicine Service pager [phone]   0120 ADDENDUM:  CXR reviewed with worsening right lower lobe pneumonia. Left lower lung opacities unchanged from prior.Consistent with continued aspiration. On Unasyn  and respiratory support as above.   9847 ADDENDUM:  CT Head negative for acute change. VBG okay. Will continue close monitoring.

## 2024-03-06 NOTE — Progress Notes (Signed)
 EEG complete - results pending

## 2024-03-06 NOTE — Progress Notes (Addendum)
 88- this nurse saw patient awake and legs are going on the side of the bed, patient's body was stiff and unable to verbalize anything, saturations started dropping  as low as 60% no cyanosis noted, placed on nonrebreather. Rapid response called by Wyvonna RN.  [phone]- patient's body is not stiff anymore and vital signs were taken informed Mindy via phone about latest development  0031- updated  Dr. Jerrie about latest changes about patient.

## 2024-03-06 NOTE — Plan of Care (Signed)
 Updated brother Redell via phone. Discussed overnight events and current NPO status. GOC conversation with palliative later today. Someone from our team will try to attend.

## 2024-03-06 NOTE — Assessment & Plan Note (Signed)
 Hypothyroidism-continue Synthroid  Schizophrenia-continue benztropine  0.5mg  BID, perphenazine  24mg  daily, phenelzine  45mg  BID History of seizures-continue Depakote  sprinkle 250mg  q8h Hyperlipidemia-continue atorvastatin  40mg  DM- CBGs well controlled here, stopping outpatient ozempic  Gout-continue allopurinol  150mg  daily

## 2024-03-06 NOTE — Procedures (Signed)
Patient Name: [name]  MRN: [id number]  Epilepsy Attending: Arlin MALVA Krebs  Referring Physician/Provider: Alba Sharper, MD  Date: 03/05/2024 Duration: 23.36 mins  Patient history: 63yo F with ams. EEG to evaluate for seizure  Level of alertness: Awake  AEDs during EEG study: VPA  Technical aspects: This EEG study was done with scalp electrodes positioned according to the 10-20 International system of electrode placement. Electrical activity was reviewed with band pass filter of 1-70Hz , sensitivity of 7 uV/mm, display speed of 59mm/sec with a 60Hz  notched filter applied as appropriate. EEG data were recorded continuously and digitally stored.  Video monitoring was available and reviewed as appropriate.  Description: Continuous generalized 3 to 6 Hz theta-delta slowing was noted. EEG also showed generalized and maximal occipital periodic discharges with triphasic morphology at 1 Hz, more prominent when awake/stimulated. Hyperventilation and photic stimulation were not performed.     ABNORMALITY - Periodic discharges with triphasic morphology, generalized and maximal occipital ( GPDs) - Continuous slow, generalized  IMPRESSION: This study is suggestive of moderate diffuse encephalopathy likely related to toxic-metabolic etiology. No seizures or definite epileptiform discharges were seen throughout the recording.  [name]

## 2024-03-06 NOTE — Plan of Care (Signed)
 I was at bedside during GOC conversation with Josseline PA, Nurse Glenna, Dr. Idelle and Dr. Lupie. Family has decided to convert to comfort care. Ultimately I feel this is best for him. I will follow for as long as patient is hospitalized.

## 2024-03-06 NOTE — IPAL (Signed)
  Interdisciplinary Goals of Care Family Meeting   Date carried out: 02/22/2024  Location of the meeting: Bedside  Member's involved: Physician, Bedside Registered Nurse, and Family Member or next of kin  Durable Power of Attorney or acting medical decision maker: Mother, present along with sibling  Discussion: We discussed goals of care for [name], [name] .  Gradual decline over time and functional status.  For the last nearly 1 year living in skilled nursing facility/assisted living facility.  With frontotemporal changes on CT likely frontotemporal dementia.  Fell with subdural hemorrhage.  Intermittent levels of consciousness.  We discussed DNR which is sounds like was his and their preference.  Given his decompensation respiratory status there is some talk of intubating him we discussed the options.  We agreed we would not want him to suffer.  We discussed that being on the ventilator certainly would be a period of discomfort and suffering.  As such for now we agreed to continue full DNR/DNI.  We also discussed that if they were to change her mind and would prefer him to be intubated they could make that choice.  Code status:   Code Status: Limited: Do not attempt resuscitation (DNR) -DNR-LIMITED -Do Not Intubate/DNI    Disposition: Continue current acute care  Time spent for the meeting: n/a    Anthony JONELLE Beals, MD  02/13/2024, 12:09 PM

## 2024-03-06 NOTE — Progress Notes (Signed)
   02/16/2024 1154  Oxygen Therapy/Pulse Ox  O2 Device HHFNC  $ High Flow Nasal POS Pressure Daily Yes  $ Kit Airspiral Circuit Neb Adpt Yes  Heated High Flow Nasal Cannula  Yes  Heated High Flow Nasal Cannula  Adult Large  $ Adult Large Yes  O2 Therapy Oxygen humidified  Heater temperature 93.2 F (34 C)  O2 Flow Rate (L/min) 40 L/min  FiO2 (%) 50 %    Pt was placed on HHFNC from NRB to due low saturations and WOB. Pt is tolerating well at this time.

## 2024-03-06 NOTE — Progress Notes (Signed)
Daily Progress Note Intern Pager: [phone]  Patient name: [name] Medical record number: [id number] Date of birth: 1961/01/22 Age: 63 y.o. Gender: male  Primary Care Provider: Howell Lunger, DO Consultants: Neurology, pulmonology Code Status: Full  Pt Overview and Major Events to Date:  08/21: admitted with traumatic SAH and SDH 08/24: cardiac arrest, ROSC, intubation, ICU, extubation 08/26: transfer back to FMTS on Neuroprogressive 08/25: 2 episodes of aspiration desatting into the 60s and recovery with non-rebreather  08/27: transfer back to ICU for repeating aspiration  Assessment and Plan:  [name] is a 63yo M initially admitted to [facility] on 02/24/24 with traumatic SAH & SDH s/p reversal with Kcentra  w/o surgical intervention as per NSGY. His hospital course has been complicated by cardiac arrest on 08/24 with ROSC after 1.5 minutes of chest compressions and transfer to ICU as well as multiple episodes of aspiration with worsening aspiration PNA. He has been transferred back to us  on the Neuroprogressive floor as of 08/26. Assessment & Plan Subdural hemorrhage (HCC) Subarachnoid hemorrhage (HCC) Bleeds stable on subsequent head imaging.  - Neurochecks every 4 hours - Appreciate PT, OT - SLP recheck today s/p aspiration episodes 08/26 and overnight  - NPO until new recs - Palliative consulted, appreciate recs Aspiration pneumonia (HCC) LLL, s/p extubation 08/24 - Switch Unasyn  3g q6h (given 2 days) to Zosyn  3.375g q8h 08/27-? Depending on patient goals of care and worsening/improving aspiration PNA/possible intubation?  Persistent atrial fibrillation (HCC) On Eliquis  prior to admission but will discontinue during admission.  - Continuous cardiac telemetry - SCDs for VTE ppx History of seizures No evidence of returning seizure activity on 08/25 EEG.  - Continue IV Depacon  500mg  BID (250mg  q6h IV) Multiple falls Suspect 2/2 deconditioning,  progressing dementia  -PT/OT recommending SNF -Fall precautions Difficulty in urination Most likely 2/2 BPH. Was unable to take Flomax  initially because he did not pass bedside swallow on admission. 16 Fr coude cath inserted 8/22 with good output - Trial cath removal when appropriate - Consider flomax  trial  Chronic health problem Hypothyroidism-continue Synthroid  125mcg Schizophrenia-continue benztropine  0.5mg  BID, perphenazine  24mg  daily, phenelzine  45mg  BID History of seizures-continue Depakote  sprinkle 250mg  q8h Hyperlipidemia-continue atorvastatin  40mg  DM- CBGs well controlled here, stopping outpatient ozempic  Gout-continue allopurinol  150mg  daily  FEN/GI: NPO PPx: SCDs Dispo:TBD based on palliative goals of care discussion.  Subjective:  Patient was seen and evaluated at bedside. He is not very alert.  Objective: Temp:  [98 F (36.7 C)-100.2 F (37.9 C)] 99.8 F (37.7 C) (08/27 0526) Pulse Rate:  [74-100] 97 (08/27 0526) Resp:  [18-25] 20 (08/27 0526) BP: (90-183)/(53-105) 90/72 (08/27 0526) SpO2:  [94 %-99 %] 94 % (08/27 0526)  Physical Exam: General: NAD Cardiovascular: irregularly irregular Respiratory: 6L Reader, normal WOB Abdomen: soft, non-tender, non-distended Extremities: no peripheral edema Neuro: lethargic, not following commands  Laboratory: Most recent CBC Lab Results  Component Value Date   WBC 12.1 (H) 02/10/2024   HGB 10.1 (L) 02/08/2024   HCT 30.2 (L) 02/23/2024   MCV 88.6 02/16/2024   PLT 103 (L) 02/08/2024   Most recent BMP    Latest Ref Rng & Units 02/12/2024    1:01 AM  BMP  Glucose 70 - 99 mg/dL 896   BUN 8 - 23 mg/dL 6   Creatinine 9.38 - 8.75 mg/dL 9.26   Sodium 864 - 854 mmol/L 130   Potassium 3.5 - 5.1 mmol/L 3.5   Chloride 98 - 111 mmol/L 94  CO2 22 - 32 mmol/L 24   Calcium  8.9 - 10.3 mg/dL 8.4    Lupie Credit, DO 02/04/2024, 7:22 AM  PGY-1, [facility] Health Family Medicine FPTS Intern pager: [phone], text pages  welcome Secure chat group [facility] Teaching Service

## 2024-03-06 DEATH — deceased

## 2024-03-09 ENCOUNTER — Ambulatory Visit

## 2024-04-03 ENCOUNTER — Ambulatory Visit: Admitting: Psychology

## 2024-04-05 NOTE — Accreditation Note (Signed)
 Death within 24 hours of discontinuation of soft roll belt restraints logged on 03/08/2024 at 1014 by Valentin Lai RN

## 2024-05-02 ENCOUNTER — Ambulatory Visit: Admitting: Diagnostic Neuroimaging

## 2024-05-25 ENCOUNTER — Ambulatory Visit (HOSPITAL_COMMUNITY): Admitting: Psychiatry

## 2024-06-15 ENCOUNTER — Encounter
